# Patient Record
Sex: Female | Born: 1945 | Race: Black or African American | Hispanic: No | Marital: Single | Smoking: Former smoker
Health system: Southern US, Community
[De-identification: ages and names within clinical notes are randomized; demographics above are authoritative.]

## PROBLEM LIST (undated history)

## (undated) DIAGNOSIS — I5032 Chronic diastolic (congestive) heart failure: Secondary | ICD-10-CM

## (undated) DIAGNOSIS — M8580 Other specified disorders of bone density and structure, unspecified site: Secondary | ICD-10-CM

## (undated) DIAGNOSIS — I10 Essential (primary) hypertension: Secondary | ICD-10-CM

## (undated) DIAGNOSIS — E785 Hyperlipidemia, unspecified: Secondary | ICD-10-CM

## (undated) DIAGNOSIS — E039 Hypothyroidism, unspecified: Secondary | ICD-10-CM

## (undated) DIAGNOSIS — E559 Vitamin D deficiency, unspecified: Secondary | ICD-10-CM

## (undated) DIAGNOSIS — Z8619 Personal history of other infectious and parasitic diseases: Secondary | ICD-10-CM

## (undated) DIAGNOSIS — J181 Lobar pneumonia, unspecified organism: Secondary | ICD-10-CM

## (undated) DIAGNOSIS — E049 Nontoxic goiter, unspecified: Secondary | ICD-10-CM

## (undated) DIAGNOSIS — Z860100 Personal history of colon polyps, unspecified: Secondary | ICD-10-CM

## (undated) DIAGNOSIS — M069 Rheumatoid arthritis, unspecified: Secondary | ICD-10-CM

## (undated) DIAGNOSIS — D649 Anemia, unspecified: Secondary | ICD-10-CM

## (undated) DIAGNOSIS — I4891 Unspecified atrial fibrillation: Secondary | ICD-10-CM

## (undated) DIAGNOSIS — H269 Unspecified cataract: Secondary | ICD-10-CM

## (undated) DIAGNOSIS — R0602 Shortness of breath: Secondary | ICD-10-CM

## (undated) DIAGNOSIS — I071 Rheumatic tricuspid insufficiency: Secondary | ICD-10-CM

## (undated) DIAGNOSIS — Z862 Personal history of diseases of the blood and blood-forming organs and certain disorders involving the immune mechanism: Secondary | ICD-10-CM

## (undated) DIAGNOSIS — R718 Other abnormality of red blood cells: Secondary | ICD-10-CM

## (undated) DIAGNOSIS — R011 Cardiac murmur, unspecified: Secondary | ICD-10-CM

## (undated) DIAGNOSIS — I340 Nonrheumatic mitral (valve) insufficiency: Secondary | ICD-10-CM

## (undated) DIAGNOSIS — K222 Esophageal obstruction: Secondary | ICD-10-CM

## (undated) DIAGNOSIS — M810 Age-related osteoporosis without current pathological fracture: Secondary | ICD-10-CM

## (undated) DIAGNOSIS — I272 Pulmonary hypertension, unspecified: Secondary | ICD-10-CM

## (undated) HISTORY — DX: Hyperlipidemia, unspecified: E78.5

## (undated) HISTORY — DX: Personal history of colon polyps, unspecified: Z86.0100

## (undated) HISTORY — DX: Anemia, unspecified: D64.9

## (undated) HISTORY — DX: Esophageal obstruction: K22.2

## (undated) HISTORY — DX: Personal history of diseases of the blood and blood-forming organs and certain disorders involving the immune mechanism: Z86.2

## (undated) HISTORY — DX: Personal history of other infectious and parasitic diseases: Z86.19

## (undated) HISTORY — DX: Hypothyroidism, unspecified: E03.9

## (undated) HISTORY — DX: Other abnormality of red blood cells: R71.8

## (undated) HISTORY — DX: Age-related osteoporosis without current pathological fracture: M81.0

## (undated) HISTORY — DX: Vitamin D deficiency, unspecified: E55.9

## (undated) HISTORY — DX: Unspecified cataract: H26.9

## (undated) HISTORY — DX: Lobar pneumonia, unspecified organism: J18.1

## (undated) HISTORY — DX: Unspecified atrial fibrillation: I48.91

## (undated) HISTORY — DX: Chronic diastolic (congestive) heart failure: I50.32

## (undated) HISTORY — DX: Nontoxic goiter, unspecified: E04.9

## (undated) HISTORY — DX: Other specified disorders of bone density and structure, unspecified site: M85.80

## (undated) HISTORY — DX: Personal history of colonic polyps: Z86.010

## (undated) HISTORY — DX: Cardiac murmur, unspecified: R01.1

## (undated) HISTORY — DX: Rheumatoid arthritis, unspecified: M06.9

## (undated) HISTORY — DX: Essential (primary) hypertension: I10

---

## 1968-06-22 HISTORY — PX: PARTIAL HYSTERECTOMY: SHX80

## 1998-10-18 ENCOUNTER — Ambulatory Visit (HOSPITAL_COMMUNITY): Admission: RE | Admit: 1998-10-18 | Discharge: 1998-10-18 | Payer: Self-pay | Admitting: Endocrinology

## 1999-05-23 DIAGNOSIS — Z8709 Personal history of other diseases of the respiratory system: Secondary | ICD-10-CM | POA: Insufficient documentation

## 1999-06-03 ENCOUNTER — Other Ambulatory Visit: Admission: RE | Admit: 1999-06-03 | Discharge: 1999-06-03 | Payer: Self-pay | Admitting: Endocrinology

## 1999-06-07 ENCOUNTER — Ambulatory Visit (HOSPITAL_COMMUNITY): Admission: RE | Admit: 1999-06-07 | Discharge: 1999-06-07 | Payer: Self-pay | Admitting: Endocrinology

## 1999-06-07 ENCOUNTER — Encounter: Payer: Self-pay | Admitting: Endocrinology

## 1999-10-17 ENCOUNTER — Encounter: Payer: Self-pay | Admitting: Endocrinology

## 1999-10-17 ENCOUNTER — Ambulatory Visit (HOSPITAL_COMMUNITY): Admission: RE | Admit: 1999-10-17 | Discharge: 1999-10-17 | Payer: Self-pay | Admitting: Endocrinology

## 2000-10-19 ENCOUNTER — Emergency Department (HOSPITAL_COMMUNITY): Admission: EM | Admit: 2000-10-19 | Discharge: 2000-10-19 | Payer: Self-pay | Admitting: *Deleted

## 2000-10-22 ENCOUNTER — Ambulatory Visit (HOSPITAL_COMMUNITY): Admission: RE | Admit: 2000-10-22 | Discharge: 2000-10-22 | Payer: Self-pay | Admitting: Endocrinology

## 2000-10-22 ENCOUNTER — Encounter: Payer: Self-pay | Admitting: Endocrinology

## 2001-11-14 ENCOUNTER — Ambulatory Visit (HOSPITAL_COMMUNITY): Admission: RE | Admit: 2001-11-14 | Discharge: 2001-11-14 | Payer: Self-pay | Admitting: Endocrinology

## 2001-11-14 ENCOUNTER — Encounter: Payer: Self-pay | Admitting: Endocrinology

## 2002-10-27 ENCOUNTER — Ambulatory Visit (HOSPITAL_COMMUNITY): Admission: RE | Admit: 2002-10-27 | Discharge: 2002-10-27 | Payer: Self-pay | Admitting: Endocrinology

## 2002-10-27 ENCOUNTER — Encounter: Payer: Self-pay | Admitting: Endocrinology

## 2003-11-20 ENCOUNTER — Ambulatory Visit (HOSPITAL_COMMUNITY): Admission: RE | Admit: 2003-11-20 | Discharge: 2003-11-20 | Payer: Self-pay | Admitting: Endocrinology

## 2004-11-21 ENCOUNTER — Ambulatory Visit (HOSPITAL_COMMUNITY): Admission: RE | Admit: 2004-11-21 | Discharge: 2004-11-21 | Payer: Self-pay | Admitting: Endocrinology

## 2005-02-18 ENCOUNTER — Ambulatory Visit: Payer: Self-pay | Admitting: Internal Medicine

## 2005-02-19 ENCOUNTER — Ambulatory Visit: Payer: Self-pay | Admitting: Internal Medicine

## 2005-02-19 ENCOUNTER — Encounter (INDEPENDENT_AMBULATORY_CARE_PROVIDER_SITE_OTHER): Payer: Self-pay | Admitting: *Deleted

## 2005-02-19 DIAGNOSIS — Z860100 Personal history of colon polyps, unspecified: Secondary | ICD-10-CM | POA: Insufficient documentation

## 2005-02-19 DIAGNOSIS — K222 Esophageal obstruction: Secondary | ICD-10-CM

## 2005-02-19 DIAGNOSIS — Z862 Personal history of diseases of the blood and blood-forming organs and certain disorders involving the immune mechanism: Secondary | ICD-10-CM | POA: Insufficient documentation

## 2005-02-19 HISTORY — DX: Esophageal obstruction: K22.2

## 2005-07-06 ENCOUNTER — Encounter (INDEPENDENT_AMBULATORY_CARE_PROVIDER_SITE_OTHER): Payer: Self-pay | Admitting: Internal Medicine

## 2006-10-14 ENCOUNTER — Encounter (INDEPENDENT_AMBULATORY_CARE_PROVIDER_SITE_OTHER): Payer: Self-pay | Admitting: Internal Medicine

## 2006-10-21 ENCOUNTER — Ambulatory Visit: Payer: Self-pay | Admitting: Internal Medicine

## 2006-10-21 LAB — CONVERTED CEMR LAB
Cholesterol: 177 mg/dL
Triglycerides: 46 mg/dL

## 2006-10-22 ENCOUNTER — Ambulatory Visit: Payer: Self-pay | Admitting: *Deleted

## 2006-11-30 ENCOUNTER — Ambulatory Visit: Payer: Self-pay | Admitting: Internal Medicine

## 2006-11-30 LAB — CONVERTED CEMR LAB: Glucose, Bld: 90 mg/dL

## 2007-02-01 ENCOUNTER — Ambulatory Visit: Payer: Self-pay | Admitting: Internal Medicine

## 2007-02-01 LAB — CONVERTED CEMR LAB
BUN: 14 mg/dL (ref 6–23)
Calcium: 9.1 mg/dL (ref 8.4–10.5)
Chloride: 104 meq/L (ref 96–112)
Creatinine, Ser: 0.78 mg/dL (ref 0.40–1.20)

## 2007-02-16 ENCOUNTER — Encounter (INDEPENDENT_AMBULATORY_CARE_PROVIDER_SITE_OTHER): Payer: Self-pay | Admitting: Internal Medicine

## 2007-02-16 DIAGNOSIS — R7309 Other abnormal glucose: Secondary | ICD-10-CM

## 2007-02-16 DIAGNOSIS — E039 Hypothyroidism, unspecified: Secondary | ICD-10-CM | POA: Insufficient documentation

## 2007-02-16 DIAGNOSIS — E042 Nontoxic multinodular goiter: Secondary | ICD-10-CM

## 2007-02-22 ENCOUNTER — Telehealth (INDEPENDENT_AMBULATORY_CARE_PROVIDER_SITE_OTHER): Payer: Self-pay | Admitting: Internal Medicine

## 2007-02-26 DIAGNOSIS — E785 Hyperlipidemia, unspecified: Secondary | ICD-10-CM

## 2007-03-09 ENCOUNTER — Encounter (INDEPENDENT_AMBULATORY_CARE_PROVIDER_SITE_OTHER): Payer: Self-pay | Admitting: Nurse Practitioner

## 2007-03-09 ENCOUNTER — Ambulatory Visit: Payer: Self-pay | Admitting: Family Medicine

## 2007-03-09 LAB — CONVERTED CEMR LAB
Potassium: 3.3 meq/L — ABNORMAL LOW (ref 3.5–5.3)
TSH: 1.095 microintl units/mL (ref 0.350–5.50)

## 2007-03-14 ENCOUNTER — Encounter (INDEPENDENT_AMBULATORY_CARE_PROVIDER_SITE_OTHER): Payer: Self-pay | Admitting: Nurse Practitioner

## 2007-03-28 ENCOUNTER — Telehealth (INDEPENDENT_AMBULATORY_CARE_PROVIDER_SITE_OTHER): Payer: Self-pay | Admitting: Nurse Practitioner

## 2007-07-06 ENCOUNTER — Ambulatory Visit: Payer: Self-pay | Admitting: Nurse Practitioner

## 2007-07-06 DIAGNOSIS — M65839 Other synovitis and tenosynovitis, unspecified forearm: Secondary | ICD-10-CM

## 2007-07-06 DIAGNOSIS — M65849 Other synovitis and tenosynovitis, unspecified hand: Secondary | ICD-10-CM

## 2007-07-06 DIAGNOSIS — H6120 Impacted cerumen, unspecified ear: Secondary | ICD-10-CM

## 2007-09-27 ENCOUNTER — Ambulatory Visit (HOSPITAL_COMMUNITY): Admission: RE | Admit: 2007-09-27 | Discharge: 2007-09-27 | Payer: Self-pay | Admitting: Internal Medicine

## 2007-09-27 ENCOUNTER — Ambulatory Visit: Payer: Self-pay | Admitting: Internal Medicine

## 2007-09-27 DIAGNOSIS — R5383 Other fatigue: Secondary | ICD-10-CM

## 2007-09-27 DIAGNOSIS — R5381 Other malaise: Secondary | ICD-10-CM

## 2007-09-29 LAB — CONVERTED CEMR LAB
Basophils Absolute: 0 10*3/uL (ref 0.0–0.1)
Basophils Relative: 1 % (ref 0–1)
Eosinophils Absolute: 0.1 10*3/uL (ref 0.0–0.7)
Eosinophils Relative: 2 % (ref 0–5)
HCT: 35.3 % — ABNORMAL LOW (ref 36.0–46.0)
Hemoglobin: 11.8 g/dL — ABNORMAL LOW (ref 12.0–15.0)
Lymphocytes Relative: 31 % (ref 12–46)
Lymphs Abs: 1.6 10*3/uL (ref 0.7–4.0)
MCHC: 33.4 g/dL (ref 30.0–36.0)
MCV: 75.6 fL — ABNORMAL LOW (ref 78.0–100.0)
Monocytes Absolute: 0.4 10*3/uL (ref 0.1–1.0)
Monocytes Relative: 8 % (ref 3–12)
Neutro Abs: 3 10*3/uL (ref 1.7–7.7)
Neutrophils Relative %: 58 % (ref 43–77)
Platelets: 209 10*3/uL (ref 150–400)
RBC: 4.67 M/uL (ref 3.87–5.11)
RDW: 14.5 % (ref 11.5–15.5)
TSH: 6.385 microintl units/mL — ABNORMAL HIGH (ref 0.350–5.50)
WBC: 5.1 10*3/uL (ref 4.0–10.5)

## 2007-10-03 ENCOUNTER — Encounter (INDEPENDENT_AMBULATORY_CARE_PROVIDER_SITE_OTHER): Payer: Self-pay | Admitting: Nurse Practitioner

## 2007-10-03 LAB — CONVERTED CEMR LAB
Ferritin: 132 ng/mL (ref 10–291)
Iron: 114 ug/dL (ref 42–145)

## 2007-10-04 ENCOUNTER — Encounter (INDEPENDENT_AMBULATORY_CARE_PROVIDER_SITE_OTHER): Payer: Self-pay | Admitting: Nurse Practitioner

## 2007-10-04 ENCOUNTER — Ambulatory Visit: Payer: Self-pay | Admitting: Internal Medicine

## 2007-12-27 ENCOUNTER — Ambulatory Visit: Payer: Self-pay | Admitting: Internal Medicine

## 2007-12-27 LAB — CONVERTED CEMR LAB
Ketones, urine, test strip: NEGATIVE
Protein, U semiquant: NEGATIVE
WBC Urine, dipstick: NEGATIVE

## 2007-12-29 ENCOUNTER — Ambulatory Visit (HOSPITAL_COMMUNITY): Admission: RE | Admit: 2007-12-29 | Discharge: 2007-12-29 | Payer: Self-pay | Admitting: Internal Medicine

## 2008-01-13 LAB — CONVERTED CEMR LAB
ALT: 14 units/L (ref 0–35)
AST: 17 units/L (ref 0–37)
BUN: 12 mg/dL (ref 6–23)
Basophils Relative: 1 % (ref 0–1)
Calcium: 9.4 mg/dL (ref 8.4–10.5)
Cholesterol: 220 mg/dL — ABNORMAL HIGH (ref 0–200)
Creatinine, Ser: 0.84 mg/dL (ref 0.40–1.20)
Eosinophils Absolute: 0.1 10*3/uL (ref 0.0–0.7)
HDL: 61 mg/dL (ref >39)
Hemoglobin: 11.9 g/dL — ABNORMAL LOW (ref 12.0–15.0)
LDL Cholesterol: 141 mg/dL — ABNORMAL HIGH (ref 0–99)
MCHC: 31.9 g/dL (ref 30.0–36.0)
Monocytes Absolute: 0.4 10*3/uL (ref 0.1–1.0)
Neutro Abs: 3.6 10*3/uL (ref 1.7–7.7)
Platelets: 196 10*3/uL (ref 150–400)
RDW: 15.2 % (ref 11.5–15.5)
Sodium: 140 meq/L (ref 135–145)
Total Protein: 8 g/dL (ref 6.0–8.3)
Triglycerides: 88 mg/dL (ref <150)
VLDL: 18 mg/dL (ref 0–40)
WBC: 5.5 10*3/uL (ref 4.0–10.5)

## 2008-01-24 ENCOUNTER — Encounter (INDEPENDENT_AMBULATORY_CARE_PROVIDER_SITE_OTHER): Payer: Self-pay | Admitting: Internal Medicine

## 2008-01-24 ENCOUNTER — Ambulatory Visit (HOSPITAL_BASED_OUTPATIENT_CLINIC_OR_DEPARTMENT_OTHER): Admission: RE | Admit: 2008-01-24 | Discharge: 2008-01-24 | Payer: Self-pay | Admitting: Internal Medicine

## 2008-01-28 ENCOUNTER — Ambulatory Visit: Payer: Self-pay | Admitting: Internal Medicine

## 2008-02-01 ENCOUNTER — Ambulatory Visit: Payer: Self-pay | Admitting: Internal Medicine

## 2008-02-08 ENCOUNTER — Encounter (INDEPENDENT_AMBULATORY_CARE_PROVIDER_SITE_OTHER): Payer: Self-pay | Admitting: Internal Medicine

## 2008-02-08 DIAGNOSIS — R0989 Other specified symptoms and signs involving the circulatory and respiratory systems: Secondary | ICD-10-CM

## 2008-02-08 DIAGNOSIS — R0609 Other forms of dyspnea: Secondary | ICD-10-CM

## 2008-02-28 ENCOUNTER — Ambulatory Visit: Payer: Self-pay | Admitting: Internal Medicine

## 2008-03-27 ENCOUNTER — Ambulatory Visit: Payer: Self-pay | Admitting: Internal Medicine

## 2008-04-10 ENCOUNTER — Ambulatory Visit: Payer: Self-pay | Admitting: Internal Medicine

## 2008-04-10 LAB — CONVERTED CEMR LAB: TSH: 1.273 microintl units/mL (ref 0.350–4.50)

## 2008-05-07 ENCOUNTER — Encounter (INDEPENDENT_AMBULATORY_CARE_PROVIDER_SITE_OTHER): Payer: Self-pay | Admitting: Internal Medicine

## 2008-06-07 ENCOUNTER — Telehealth (INDEPENDENT_AMBULATORY_CARE_PROVIDER_SITE_OTHER): Payer: Self-pay | Admitting: Internal Medicine

## 2008-07-16 ENCOUNTER — Encounter (INDEPENDENT_AMBULATORY_CARE_PROVIDER_SITE_OTHER): Payer: Self-pay | Admitting: Internal Medicine

## 2009-02-26 ENCOUNTER — Ambulatory Visit: Payer: Self-pay | Admitting: Internal Medicine

## 2009-02-26 DIAGNOSIS — M7660 Achilles tendinitis, unspecified leg: Secondary | ICD-10-CM

## 2009-03-13 ENCOUNTER — Ambulatory Visit: Payer: Self-pay | Admitting: Internal Medicine

## 2009-03-13 LAB — CONVERTED CEMR LAB
Glucose, Urine, Semiquant: NEGATIVE
Ketones, urine, test strip: NEGATIVE
Protein, U semiquant: NEGATIVE
Specific Gravity, Urine: 1.01
pH: 7

## 2009-03-14 ENCOUNTER — Encounter (INDEPENDENT_AMBULATORY_CARE_PROVIDER_SITE_OTHER): Payer: Self-pay | Admitting: Internal Medicine

## 2009-03-18 LAB — CONVERTED CEMR LAB
ALT: 15 units/L (ref 0–35)
Alkaline Phosphatase: 82 units/L (ref 39–117)
BUN: 12 mg/dL (ref 6–23)
Basophils Absolute: 0 10*3/uL (ref 0.0–0.1)
Calcium: 9.1 mg/dL (ref 8.4–10.5)
Chloride: 103 meq/L (ref 96–112)
Creatinine, Ser: 0.79 mg/dL (ref 0.40–1.20)
Lymphocytes Relative: 28 % (ref 12–46)
Lymphs Abs: 1.3 10*3/uL (ref 0.7–4.0)
MCV: 75.1 fL — ABNORMAL LOW (ref 78.0–100.0)
Potassium: 3.8 meq/L (ref 3.5–5.3)
RBC: 4.46 M/uL (ref 3.87–5.11)
RDW: 15.1 % (ref 11.5–15.5)
TSH: 6.48 microintl units/mL — ABNORMAL HIGH (ref 0.350–4.500)
Triglycerides: 57 mg/dL (ref <150)

## 2009-07-05 ENCOUNTER — Ambulatory Visit: Payer: Self-pay | Admitting: Internal Medicine

## 2009-07-12 ENCOUNTER — Encounter (INDEPENDENT_AMBULATORY_CARE_PROVIDER_SITE_OTHER): Payer: Self-pay | Admitting: Internal Medicine

## 2009-07-12 LAB — CONVERTED CEMR LAB
Basophils Absolute: 0 10*3/uL (ref 0.0–0.1)
Eosinophils Absolute: 0.1 10*3/uL (ref 0.0–0.7)
Eosinophils Relative: 2 % (ref 0–5)
HCT: 38.5 % (ref 36.0–46.0)
Hemoglobin: 12.5 g/dL (ref 12.0–15.0)
Lymphs Abs: 1.9 10*3/uL (ref 0.7–4.0)
MCHC: 32.5 g/dL (ref 30.0–36.0)
MCV: 74.8 fL — ABNORMAL LOW (ref 78.0–100.0)
Monocytes Absolute: 0.5 10*3/uL (ref 0.1–1.0)
Monocytes Relative: 8 % (ref 3–12)
Neutro Abs: 3.9 10*3/uL (ref 1.7–7.7)
TSH: 5.551 microintl units/mL — ABNORMAL HIGH (ref 0.350–4.500)

## 2009-08-06 ENCOUNTER — Ambulatory Visit: Payer: Self-pay | Admitting: Internal Medicine

## 2009-12-03 ENCOUNTER — Telehealth (INDEPENDENT_AMBULATORY_CARE_PROVIDER_SITE_OTHER): Payer: Self-pay | Admitting: Internal Medicine

## 2009-12-03 ENCOUNTER — Ambulatory Visit: Payer: Self-pay | Admitting: Internal Medicine

## 2009-12-03 DIAGNOSIS — L538 Other specified erythematous conditions: Secondary | ICD-10-CM

## 2009-12-03 DIAGNOSIS — Z780 Asymptomatic menopausal state: Secondary | ICD-10-CM | POA: Insufficient documentation

## 2009-12-03 DIAGNOSIS — R82998 Other abnormal findings in urine: Secondary | ICD-10-CM | POA: Insufficient documentation

## 2009-12-03 LAB — CONVERTED CEMR LAB
Alkaline Phosphatase: 95 units/L (ref 39–117)
Cholesterol: 184 mg/dL (ref 0–200)
HDL: 59 mg/dL (ref >39)
LDL Cholesterol: 109 mg/dL — ABNORMAL HIGH (ref 0–99)
Protein, U semiquant: NEGATIVE
Sodium: 143 meq/L (ref 135–145)
Total CHOL/HDL Ratio: 3.1
Triglycerides: 79 mg/dL (ref <150)
VLDL: 16 mg/dL (ref 0–40)

## 2009-12-11 ENCOUNTER — Encounter (INDEPENDENT_AMBULATORY_CARE_PROVIDER_SITE_OTHER): Payer: Self-pay | Admitting: Internal Medicine

## 2009-12-11 ENCOUNTER — Ambulatory Visit (HOSPITAL_COMMUNITY): Admission: RE | Admit: 2009-12-11 | Discharge: 2009-12-11 | Payer: Self-pay | Admitting: Internal Medicine

## 2009-12-25 ENCOUNTER — Ambulatory Visit: Payer: Self-pay | Admitting: Internal Medicine

## 2009-12-30 ENCOUNTER — Encounter (INDEPENDENT_AMBULATORY_CARE_PROVIDER_SITE_OTHER): Payer: Self-pay | Admitting: Internal Medicine

## 2009-12-30 ENCOUNTER — Encounter (INDEPENDENT_AMBULATORY_CARE_PROVIDER_SITE_OTHER): Payer: Self-pay | Admitting: *Deleted

## 2010-01-01 ENCOUNTER — Encounter (INDEPENDENT_AMBULATORY_CARE_PROVIDER_SITE_OTHER): Payer: Self-pay | Admitting: Internal Medicine

## 2010-01-01 ENCOUNTER — Telehealth (INDEPENDENT_AMBULATORY_CARE_PROVIDER_SITE_OTHER): Payer: Self-pay | Admitting: Internal Medicine

## 2010-01-01 DIAGNOSIS — M8580 Other specified disorders of bone density and structure, unspecified site: Secondary | ICD-10-CM | POA: Insufficient documentation

## 2010-01-08 ENCOUNTER — Ambulatory Visit: Payer: Self-pay | Admitting: Internal Medicine

## 2010-01-08 LAB — CONVERTED CEMR LAB: Vit D, 25-Hydroxy: 14 ng/mL — ABNORMAL LOW (ref 30–89)

## 2010-01-16 ENCOUNTER — Encounter (INDEPENDENT_AMBULATORY_CARE_PROVIDER_SITE_OTHER): Payer: Self-pay | Admitting: Internal Medicine

## 2010-01-24 ENCOUNTER — Encounter (INDEPENDENT_AMBULATORY_CARE_PROVIDER_SITE_OTHER): Payer: Self-pay | Admitting: *Deleted

## 2010-01-29 ENCOUNTER — Ambulatory Visit: Payer: Self-pay | Admitting: Internal Medicine

## 2010-02-12 ENCOUNTER — Ambulatory Visit: Payer: Self-pay | Admitting: Internal Medicine

## 2010-02-16 ENCOUNTER — Encounter: Payer: Self-pay | Admitting: Internal Medicine

## 2010-05-14 ENCOUNTER — Ambulatory Visit: Payer: Self-pay | Admitting: Internal Medicine

## 2010-07-03 ENCOUNTER — Ambulatory Visit: Admit: 2010-07-03 | Payer: Self-pay | Admitting: Internal Medicine

## 2010-07-13 ENCOUNTER — Encounter: Payer: Self-pay | Admitting: Internal Medicine

## 2010-07-22 NOTE — Progress Notes (Signed)
Summary: Vit D  Phone Note Outgoing Call   Summary of Call: Please call pt--let her know she has some bone density loss, not to level of osteoporosis, however.  Would like to have her come in for a Vitamin D level.  Then needs to make sure she is taking 500 mg of calcium two times a day with total of 800 micrograms of Vitamin D daily Initial call taken by: Julieanne Manson MD,  January 01, 2010 3:16 PM  Follow-up for Phone Call        Left message on answering machine for pt to return call at [phone] Follow-up by: Vesta Mixer CMA,  January 01, 2010 3:21 PM  Additional Follow-up for Phone Call Additional follow up Details #1::        pt aware of lab results ... pt says she takes k+ and not calcium Additional Follow-up by: Armenia Shannon RMA,  January 01, 2010 3:54 PM    Additional Follow-up for Phone Call Additional follow up Details #2::    Did she get set up for the Vitamin D level as ordered? Was she given the message about how much calcium and Vitamin D to take?--confused by the response written Follow-up by: Julieanne Manson MD,  January 02, 2010 6:20 PM  Additional Follow-up for Phone Call Additional follow up Details #3:: Details for Additional Follow-up Action Taken: Left message on answer machine for pt. to return call. Pt. needs lab appt. for Vit D level, please advise her to take 500mg  Calcium two times a day and Vit D 800 micrograms daily. Gaylyn Cheers RN  January 07, 2010 12:16 PM  lab appt made.... Armenia Shannon  January 07, 2010 12:48 PM

## 2010-07-22 NOTE — Miscellaneous (Signed)
Summary: [facility] Previsit/prep  Clinical Lists Changes  Observations: Added new observation of ALLERGY REV: Done (01/29/2010 10:35)

## 2010-07-22 NOTE — Letter (Signed)
Summary: REFERRAL FORM//PHYSICAL THERAPY  REFERRAL FORM//PHYSICAL THERAPY   Imported By: Arta Bruce 08/22/2009 14:13:52  _____________________________________________________________________  External Attachment:    Type:   Image     Comment:   External Document

## 2010-07-22 NOTE — Letter (Signed)
Summary: Lipid Letter  [facility]  [address] [place], [place] [address]   Phone: [phone]  Fax: [phone]    12/11/2009  Amantha Sklar [address] [place], [place]  [address]  Dear Ms. Gilland:  We have carefully reviewed your last lipid profile from 12/03/2009 and the results are noted below with a summary of recommendations for lipid management.    Cholesterol:       184     Goal: <200   HDL "good" Cholesterol:   59     Goal: >45   LDL "bad" Cholesterol:   109     Goal: <100   Triglycerides:       79     Goal: <150    Cholesterol is fine as were the rest of your labs, including thyroid    TLC Diet (Therapeutic Lifestyle Change): Saturated Fats & Transfatty acids should be kept < 7% of total calories ***Reduce Saturated Fats Polyunstaurated Fat can be up to 10% of total calories Monounsaturated Fat Fat can be up to 20% of total calories Total Fat should be no greater than 25-35% of total calories Carbohydrates should be 50-60% of total calories Protein should be approximately 15% of total calories Fiber should be at least 20-30 grams a day ***Increased fiber may help lower LDL Total Cholesterol should be < 200mg /day Consider adding plant stanol/sterols to diet (example: Benacol spread) ***A higher intake of unsaturated fat may reduce Triglycerides and Increase HDL    Adjunctive Measures (may lower LIPIDS and reduce risk of Heart Attack) include: Aerobic Exercise (20-30 minutes 3-4 times a week) Limit Alcohol Consumption Weight Reduction Aspirin 75-81 mg a day by mouth (if not allergic or contraindicated) Dietary Fiber 20-30 grams a day by mouth     Current Medications: 1)    Klor-con 10 10 Meq  Tbcr (Potassium chloride) .... 2 tablets daily for potassium 2)    Hyzaar 100-25 Mg  Tabs (Losartan potassium-hctz) .... Take 1 tablet by mouth once a day 3)    Norvasc 10 Mg  Tabs (Amlodipine besylate) .Marland Kitchen.. 1 tab by mouth daily 4)    Benazepril Hcl 40 Mg   Tabs (Benazepril hcl) .Marland Kitchen.. 1 tab by mouth daily 5)    Toprol Xl 100 Mg Xr24h-tab (Metoprolol succinate) .Marland Kitchen.. 1 tab by mouth daily 6)    Levothyroxine Sodium 175 Mcg Tabs (Levothyroxine sodium) .Marland Kitchen.. 1 tab by mouth daily 7)    Ferrous Sulfate 325 (65 Fe) Mg Tabs (Ferrous sulfate) .Marland Kitchen.. 1 tab by mouth daily 8)    Gyne-lotrimin 3 2 % Crea (Clotrimazole) .... Apply two times a day to affected area for 2 weeks after rash clears and then as needed --feet and intriginous areas  If you have any questions, please call. We appreciate being able to work with you.   Sincerely,    [facility] Julieanne Manson MD

## 2010-07-22 NOTE — Letter (Signed)
Summary: Patient Notice- Polyp Results  [facility]  [address] [place], [place] [address]   Phone: [phone]  Fax: [phone]        February 16, 2010 MRN: [id number]    Joanna Hood [address] [place], [place]  [address]    Dear [name],  I am pleased to inform you that the colon polyp(s) removed during your recent colonoscopy was (were) found to be benign (no cancer detected) upon pathologic examination.  I recommend you have a repeat colonoscopy examination in 5 years to look for recurrent polyps, as having colon polyps increases your risk for having recurrent polyps or even colon cancer in the future.  Should you develop new or worsening symptoms of abdominal pain, bowel habit changes or bleeding from the rectum or bowels, please schedule an evaluation with either your primary care physician or with me.  Additional information/recommendations:  __ No further action with gastroenterology is needed at this time. Please      follow-up with your primary care physician for your other healthcare      needs.   Please call us if you are having persistent problems or have questions about your condition that have not been fully answered at this time.  Sincerely,  Hilarie Fredrickson MD  This letter has been electronically signed by your physician.  Appended Document: Patient Notice- Polyp Results letter mailed

## 2010-07-22 NOTE — Letter (Signed)
Summary: [facility] Instructions  [facility]  [address] [place], [place] [address]   Phone: [phone]  Fax: [phone]       Joanna Hood    [birth year]    MRN: [id number]        Procedure Day Dorna Bloom:  [facility]  02/12/10     Arrival Time:  10:00AM     Procedure Time:  11:00AM     Location of Procedure:                    _X _  [facility] (4th Floor)                       PREPARATION FOR COLONOSCOPY WITH MOVIPREP   Starting 5 days prior to your procedure 02/07/10 do not eat nuts, seeds, popcorn, corn, beans, peas,  salads, or any raw vegetables.  Do not take any fiber supplements (e.g. Metamucil, Citrucel, and Benefiber).  THE DAY BEFORE YOUR PROCEDURE         DATE: 02/11/10  DAY: TUESDAY  1.  Drink clear liquids the entire day-NO SOLID FOOD  2.  Do not drink anything colored red or purple.  Avoid juices with pulp.  No orange juice.  3.  Drink at least 64 oz. (8 glasses) of fluid/clear liquids during the day to prevent dehydration and help the prep work efficiently.  CLEAR LIQUIDS INCLUDE: Water Jello Ice Popsicles Tea (sugar ok, no milk/cream) Powdered fruit flavored drinks Coffee (sugar ok, no milk/cream) Gatorade Juice: apple, white grape, white cranberry  Lemonade Clear bullion, consomm, broth Carbonated beverages (any kind) Strained chicken noodle soup Hard Candy                             4.  In the morning, mix first dose of MoviPrep solution:    Empty 1 Pouch A and 1 Pouch B into the disposable container    Add lukewarm drinking water to the top line of the container. Mix to dissolve    Refrigerate (mixed solution should be used within 24 hrs)  5.  Begin drinking the prep at 5:00 p.m. The MoviPrep container is divided by 4 marks.   Every 15 minutes drink the solution down to the next mark (approximately 8 oz) until the full liter is complete.   6.  Follow completed prep with 16 oz of clear liquid of your choice  (Nothing red or purple).  Continue to drink clear liquids until bedtime.  7.  Before going to bed, mix second dose of MoviPrep solution:    Empty 1 Pouch A and 1 Pouch B into the disposable container    Add lukewarm drinking water to the top line of the container. Mix to dissolve    Refrigerate  THE DAY OF YOUR PROCEDURE      DATE: 02/12/10  DAY: WEDNESDAY  Beginning at 6:00AM (5 hours before procedure):         1. Every 15 minutes, drink the solution down to the next mark (approx 8 oz) until the full liter is complete.  2. Follow completed prep with 16 oz. of clear liquid of your choice.    3. You may drink clear liquids until 9:00AM (2 HOURS BEFORE PROCEDURE).   MEDICATION INSTRUCTIONS  Unless otherwise instructed, you should take regular prescription medications with a small sip of water   as early as possible the morning of  your procedure.  Be sure to take your other blood pressure medicine not listed below the morning of your procedure.   Additional medication instructions: Hold Klor-Con and Hyzaar the morning of procedure.   Stop iron 02/07/10.         OTHER INSTRUCTIONS  You will need a responsible adult at least 64 years of age to accompany you and drive you home.   This person must remain in the waiting room during your procedure.  Wear loose fitting clothing that is easily removed.  Leave jewelry and other valuables at home.  However, you may wish to bring a book to read or  an iPod/MP3 player to listen to music as you wait for your procedure to start.  Remove all body piercing jewelry and leave at home.  Total time from sign-in until discharge is approximately 2-3 hours.  You should go home directly after your procedure and rest.  You can resume normal activities the  day after your procedure.  The day of your procedure you should not:   Drive   Make legal decisions   Operate machinery   Drink alcohol   Return to work  You will receive specific  instructions about eating, activities and medications before you leave.    The above instructions have been reviewed and explained to me by   Wyona Almas RN  January 29, 2010 11:40 AM     I fully understand and can verbalize these instructions _____________________________ Date _________

## 2010-07-22 NOTE — Assessment & Plan Note (Signed)
Summary: 6 MONTH FU FOR CPP/////KT   Vital Signs:  Patient profile:   65 year old female Menstrual status:  hysterectomy Weight:      173 pounds BMI:     31.25 Temp:     97.5 degrees F Pulse rate:   66 / minute Pulse rhythm:   regular Resp:     18 per minute BP sitting:   154 / 79  (left arm) Cuff size:   regular  Vitals Entered By: Vesta Mixer CMA (December 03, 2009 9:15 AM)  History of Present Illness: 65 yo female here for CPP.  Concerns  1.  Hypertension:  has had borderline checks in last 1-2 years.  Has taken meds today.  2.  Irritation and pruritis in groin areas bilaterally.  No wetness or redness.  Fairly consistent for past 3-6 months.  Has tried changing soaps, hydrocortisone cream (this helps for a little while), vaseline.  Habits & Providers  Alcohol-Tobacco-Diet     Alcohol drinks/day: 0     Tobacco Status: quit     Year Started: smoked only 2 years.  Ages 44-20  Exercise-Depression-Behavior     Drug Use: never  Allergies (verified): 1)  Flagyl (Metronidazole)  Past History:  Past Medical History: Reviewed history from 02/16/2007 and no changes required. 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 1.  1981  Total Abdominal Hysterectomy  2.  02/19/05  EGD  Esophageal Stricture/Hiatal hernia--Dr. Marina Goodell 3.  02/19/05  Colonoscopy  Adenomatous polyp--Dr. Marina Goodell  Family History: Father--unknown Mother, died age 58, cervical cancer Brother, 34, healthy Sister, 34, DM Daughter, 86:  Hypertension  Social History: Works at a homeless shelter--hospitality house--part  time.  Cooks mainly 27 yo granddaughter lives with pt.Smoking Status:  quit Drug Use:  never  Review of Systems General:  Energy is fine. Eyes:  bifocals--Dr. Rodena Piety.  Vision fairly stable. ENT:  Denies decreased hearing. CV:  Denies chest pain or discomfort and palpitations. Resp:  Denies cough and shortness of breath. GI:  Denies abdominal pain, bloody stools, constipation, dark tarry stools, and diarrhea. GU:  Denies discharge and dysuria. MS:  Denies joint pain, joint redness, and joint swelling. Derm:  Denies lesion(s); See HPI. Neuro:  Denies numbness, tingling, and weakness. Psych:  Denies anxiety, depression, and suicidal thoughts/plans; PQH-9 scored 0.  Physical Exam  General:  Well-developed,well-nourished,in no acute distress; alert,appropriate and cooperative throughout examination Head:  Normocephalic and atraumatic without obvious abnormalities. No apparent alopecia or balding. Eyes:  No corneal or conjunctival inflammation noted. EOMI. Perrla. Funduscopic exam benign, without hemorrhages, exudates or papilledema. Vision grossly normal. Ears:  External ear exam shows no significant lesions or deformities.  Otoscopic examination reveals clear canals, tympanic membranes are intact bilaterally without bulging, retraction, inflammation or discharge. Hearing is grossly normal bilaterally. Nose:  External nasal examination shows no deformity or inflammation. Nasal mucosa are pink and moist without lesions or exudates. Mouth:  Oral mucosa and oropharynx without lesions or exudates.  Teeth in good repair. Neck:  No deformities, masses, or tenderness noted.  MIldly enlarged nodular thyroid.  No discreet nodule palpated Breasts:  No mass, nodules, thickening, tenderness, bulging, retraction, inflamation, nipple discharge or skin changes noted.   Lungs:  Normal respiratory effort, chest expands symmetrically. Lungs are clear to  auscultation, no crackles or wheezes. Heart:  Normal rate  and regular rhythm. S1 and S2 normal without gallop, murmur, click, rub or other extra sounds. Abdomen:  Bowel sounds positive,abdomen soft and non-tender without masses, organomegaly or hernias noted. Rectal:  No external abnormalities noted. Normal sphincter tone. No rectal masses or tenderness.  Heme negative light brown stool Genitalia:  Pelvic Exam:        External: normal female genitalia with mild erythema with surrounding hyperpigmentation and some areas with splitting of skin in groin bilaterally--extends down to labia majora        Vagina: normal without lesions or masses         Adnexa: normal bimanual exam without masses or fullness        Uterus: absent        Pap smear: not performed Msk:  No deformity or scoliosis noted of thoracic or lumbar spine.   Pulses:  R and L carotid,radial,femoral,dorsalis pedis and posterior tibial pulses are full and equal bilaterally Extremities:  No clubbing, cyanosis, edema, or deformity noted with normal full range of motion of all joints.   Neurologic:  No cranial nerve deficits noted. Station and gait are normal. Plantar reflexes are down-going bilaterally. DTRs are symmetrical throughout. Sensory, motor and coordinative functions appear intact. Skin:  See genital exam Feet with generalized flaking and peeling of skin.  Toenails thickened and yellow-brown Cervical Nodes:  No lymphadenopathy noted Axillary Nodes:  No palpable lymphadenopathy Inguinal Nodes:  No significant adenopathy Psych:  Cognition and judgment appear intact. Alert and cooperative with normal attention span and concentration. No apparent delusions, illusions, hallucinations   Impression & Recommendations:  Problem # 1:  ROUTINE GYNECOLOGICAL EXAMINATION (ICD-V72.31) Mammogram scheduled Schedule DEXA  Problem # 2:  HYPERTENSION (ICD-401.9)  Increase Toprol to 100 mg daily--watch pulse Her updated medication list for this problem includes:    Hyzaar 100-25 Mg Tabs  (Losartan potassium-hctz) .Marland Kitchen... Take 1 tablet by mouth once a day    Norvasc 10 Mg Tabs (Amlodipine besylate) .Marland Kitchen... 1 tab by mouth daily    Benazepril Hcl 40 Mg Tabs (Benazepril hcl) .Marland Kitchen... 1 tab by mouth daily    Toprol Xl 100 Mg Xr24h-tab (Metoprolol succinate) .Marland Kitchen... 1 tab by mouth daily  Orders: T-Comprehensive Metabolic Panel ([id number]) UA Dipstick w/o Micro (manual) (91478)  Problem # 3:  INTERTRIGO (GNF-621.30) LOtrimin--for tinea pedis as well. Discussed care of feet and intertrigous area to prevent recurrence  Problem # 4:  COLONIC POLYPS, ADENOMATOUS, HX OF (ICD-V12.72)  Orders: Gastroenterology Referral (GI) repeat colonoscopy  Problem # 5:  HYPERLIPIDEMIA (ICD-272.4)  Orders: T-Lipid Profile ([id number])  Problem # 6:  HYPOTHYROIDISM (ICD-244.9)  Her updated medication list for this problem includes:    Levothyroxine Sodium 175 Mcg Tabs (Levothyroxine sodium) .Marland Kitchen... 1 tab by mouth daily  Orders: T-TSH ([id number])  Complete Medication List: 1)  Klor-con 10 10 Meq Tbcr (Potassium chloride) .... 2 tablets daily for potassium 2)  Hyzaar 100-25 Mg Tabs (Losartan potassium-hctz) .... Take 1 tablet by mouth once a day 3)  Norvasc 10 Mg Tabs (Amlodipine besylate) .Marland Kitchen.. 1 tab by mouth daily 4)  Benazepril Hcl 40 Mg Tabs (Benazepril hcl) .Marland Kitchen.. 1 tab by mouth daily 5)  Toprol Xl 100 Mg Xr24h-tab (Metoprolol succinate) .Marland Kitchen.. 1 tab by mouth daily 6)  Levothyroxine Sodium 175 Mcg Tabs (Levothyroxine sodium) .Marland Kitchen.. 1 tab by mouth daily 7)  Ferrous Sulfate 325 (65 Fe) Mg Tabs (Ferrous sulfate) .Marland Kitchen.. 1 tab by mouth daily 8)  Gyne-lotrimin 3 2 % Crea (Clotrimazole) .... Apply two times a day to affected area for 2 weeks after rash clears and then as needed --feet and intriginous areas  Other Orders: Dexa scan (Dexa scan) T-Culture, Urine ([id number])  Patient Instructions: 1)  NUrse visit for bp and pulse check in 2 weeks. 2)  Follow up with Dr. Delrae Alfred in 6 months- Htn 3)   Call for Complete exam in 1 year with Dr Delrae Alfred  Preventive Care Screening  Prior Values:    Pap Smear:  Done (07/06/2005)    Mammogram:  Normal (12/29/2007)    Colonoscopy:  Abnormal (02/19/2005)    Last Tetanus Booster:  Historical (06/23/2003)    Last Flu Shot:  Fluvax 3+ (07/05/2009)     SBE:  yes, no changes Osteoprevention:  Not much dairy daily.  No calcium or Vitamin D supplement.  No hx of bone density.  Walks 3-4 times weekly. Guaiac Cards:  has not returned in some time. Colonoscopy:  due for follow up with Dr. Marina Goodell this year.   Prescriptions: FERROUS SULFATE 325 (65 FE) MG TABS (FERROUS SULFATE) 1 tab by mouth daily  #30 x 4   Entered and Authorized by:   Julieanne Manson MD   Signed by:   Julieanne Manson MD on 12/03/2009   Method used:   Faxed to ...       [facility] (retail)       [address] [place], [place]  [address]       Ph: [phone]       Fax: [phone]   RxID:   [id number] LEVOTHYROXINE SODIUM 175 MCG TABS (LEVOTHYROXINE SODIUM) 1 tab by mouth daily  #30 x 11   Entered and Authorized by:   Julieanne Manson MD   Signed by:   Julieanne Manson MD on 12/03/2009   Method used:   Faxed to ...       [facility] (retail)       [address] [place], [place]  [address]       Ph: [phone]       Fax: [phone]   RxID:   [id number] BENAZEPRIL HCL 40 MG  TABS (BENAZEPRIL HCL) 1 tab by mouth daily  #30 x 11   Entered and Authorized by:   Julieanne Manson MD   Signed by:   Julieanne Manson MD on 12/03/2009   Method used:   Faxed to ...       [facility] (retail)       [address] [place], [place]  [address]       Ph: [phone]       Fax: [phone]   RxID:   [id number] NORVASC 10 MG  TABS (AMLODIPINE BESYLATE) 1 tab by mouth daily  #30 x 11   Entered and Authorized by:    Julieanne Manson MD   Signed by:   Julieanne Manson MD on 12/03/2009   Method used:   Faxed to ...       [facility] (retail)       [address] [place], [place]  [address]       Ph: [phone]       Fax: [phone]   RxID:   [id number] HYZAAR 100-25 MG  TABS Claris Gladden  POTASSIUM-HCTZ) Take 1 tablet by mouth once a day  #30 x 11   Entered and Authorized by:   Julieanne Manson MD   Signed by:   Julieanne Manson MD on 12/03/2009   Method used:   Faxed to ...       [facility] (retail)       [address] [place], [place]  [address]       Ph: [phone]       Fax: [phone]   RxID:   [id number] KLOR-CON 10 10 MEQ  TBCR (POTASSIUM CHLORIDE) 2 tablets daily for potassium  #60 x 11   Entered and Authorized by:   Julieanne Manson MD   Signed by:   Julieanne Manson MD on 12/03/2009   Method used:   Faxed to ...       [facility] (retail)       [address] [place], [place]  [address]       Ph: [phone]       Fax: [phone]   RxID:   [id number] GYNE-LOTRIMIN 3 2 % CREA (CLOTRIMAZOLE) apply two times a day to affected area for 2 weeks after rash clears and then as needed --feet and intriginous areas  #60 g x 2   Entered and Authorized by:   Julieanne Manson MD   Signed by:   Julieanne Manson MD on 12/03/2009   Method used:   Faxed to ...       [facility] (retail)       [address] [place], [place]  [address]       Ph: [phone]       Fax: [phone]   RxID:   [id number] TOPROL XL 100 MG XR24H-TAB (METOPROLOL SUCCINATE) 1 tab by mouth daily  #30 x 11   Entered and Authorized by:   Julieanne Manson MD   Signed by:   Julieanne Manson MD on 12/03/2009   Method used:   Faxed to ...       [facility] (retail)        [address] [place], [place]  [address]       Ph: [phone]       Fax: [phone]   RxID:   [id number]   Laboratory Results   Urine Tests    Routine Urinalysis   Glucose: negative   (Normal Range: Negative) Bilirubin: negative   (Normal Range: Negative) Ketone: negative   (Normal Range: Negative) Spec. Gravity: 1.010   (Normal Range: 1.003-1.035) Blood: trace-intact   (Normal Range: Negative) pH: 6.0   (Normal Range: 5.0-8.0) Protein: negative   (Normal Range: Negative) Urobilinogen: 0.2   (Normal Range: 0-1) Nitrite: negative   (Normal Range: Negative) Leukocyte Esterace: small   (Normal Range: Negative)

## 2010-07-22 NOTE — Letter (Signed)
Summary: *HSN Results Follow up  [facility]  [address] [place], [place] [address]   Phone: [phone]  Fax: [phone]      01/16/2010   Greer L Furrow [address] [place], [place]  [address]   Dear  Ms. Maxene Moffit,                            ____S.Drinkard,FNP   ____D. Gore,FNP       ____B. McPherson,MD   ____V. Rankins,MD    __X__E. Lyrah Bradt,MD    ____N. Daphine Deutscher, FNP  ____D. Reche Dixon, MD    ____K. Philipp Deputy, MD    ____Other     This letter is to inform you that your recent test(s):  _______Pap Smear    ____X___Lab Test     _______X-ray    ___X____ is within acceptable limits  _______ requires a medication change  _______ requires a follow-up lab visit  _______ requires a follow-up visit with your provider   Comments:  Your Vitamin D level was okay--so just need to take a total of 800 International Units  along with the Calcium that Tiffany discussed with you.  Make sure you are getting regular exercise as well.       _________________________________________________________ If you have any questions, please contact our office                     Sincerely,  Julieanne Manson MD [facility]

## 2010-07-22 NOTE — Letter (Signed)
Summary: Previsit letter  [facility]  [address] [place], [place] [address]   Phone: [phone]  Fax: [phone]       12/30/2009 MRN: [id number]  Joanna Hood [address] [place], [place]  [address]  Dear [name],  Welcome to the Gastroenterology Division at [facility].    You are scheduled to see a nurse for your pre-procedure visit on 01-29-10 at 11:00a.m. on the 3rd floor at [facility], [address]  We ask that you try to arrive at our office 15 minutes prior to your appointment time to allow for check-in.  Your nurse visit will consist of discussing your medical and surgical history, your immediate family medical history, and your medications.    Please bring a complete list of all your medications or, if you prefer, bring the medication bottles and we will list them.  We will need to be aware of both prescribed and over the counter drugs.  We will need to know exact dosage information as well.  If you are on blood thinners (Coumadin, Plavix, Aggrenox, Ticlid, etc.) please call our office today/prior to your appointment, as we need to consult with your physician about holding your medication.   Please be prepared to read and sign documents such as consent forms, a financial agreement, and acknowledgement forms.  If necessary, and with your consent, a friend or relative is welcome to sit-in on the nurse visit with you.  Please bring your insurance card so that we may make a copy of it.  If your insurance requires a referral to see a specialist, please bring your referral form from your primary care physician.  No co-pay is required for this nurse visit.     If you cannot keep your appointment, please call [phone] to cancel or reschedule prior to your appointment date.  This allows [place] the opportunity to schedule an appointment for another patient in need of care.    Thank you for choosing [facility] for your medical needs.  We  appreciate the opportunity to care for you.  Please visit [place] at our website  to learn more about our practice.                     Sincerely.                                                                                                                   The Gastroenterology Division

## 2010-07-22 NOTE — Procedures (Signed)
Summary: Colonoscopy  Patient: [name] Note: All result statuses are Final unless otherwise noted.  Tests: (1) Colonoscopy (COL)   COL Colonoscopy           DONE     [facility]     [address]     [place], [place]  [address]           COLONOSCOPY PROCEDURE REPORT           PATIENT:  Joanna Hood, Joanna Hood  MR#:  [id number]     BIRTHDATE:  [birth year], 63 yrs. old  GENDER:  female     ENDOSCOPIST:  Wilhemina Bonito. Eda Keys, MD     REF. BY:  Surveillance Program Recall,     PROCEDURE DATE:  02/12/2010     PROCEDURE:  Colonoscopy with snare polypectomy x 1     ASA CLASS:  Class II     INDICATIONS:  history of pre-cancerous (adenomatous) colon polyps,     surveillance and high-risk screening ; index exam 8-06 w/ small     adenoma     MEDICATIONS:   Fentanyl 75 mcg IV, Versed 7 mg IV           DESCRIPTION OF PROCEDURE:   After the risks benefits and     alternatives of the procedure were thoroughly explained, informed     consent was obtained.  Digital rectal exam was performed and     revealed no abnormalities.   The LB [id number] endoscope     was introduced through the anus and advanced to the cecum, which     was identified by both the appendix and ileocecal valve, without     limitations.Time to cecum = 3:00 min.  The quality of the prep was     excellent, using MoviPrep.  The instrument was then slowly     withdrawn (time = 12:07 min) as the colon was fully examined.     <<PROCEDUREIMAGES>>           FINDINGS:  A 5mm sessile polyp was found in the proximal ascending     colon. Polyp was snared without cautery. Retrieval was successful.     snare polyp  This was otherwise a normal examination of the colon.     Retroflexed views in the rectum revealed no abnormalities.    The     scope was then withdrawn from the patient and the procedure     completed.           COMPLICATIONS:  None           ENDOSCOPIC IMPRESSION:     1) Sessile polyp in the ascending colon -  removed     2) Otherwise normal examination           RECOMMENDATIONS:     1) Follow up colonoscopy in 5 years           ______________________________     Wilhemina Bonito. Eda Keys, MD           CC:  Julieanne Manson, MD; The Patient           n.     eSIGNED:   Wilhemina Bonito. Eda Keys at 02/12/2010 12:01 PM           [name], [id number]  Note: An exclamation mark (!) indicates a result that was not dispersed into the flowsheet. Document Creation Date: 02/12/2010 12:21 PM _______________________________________________________________________  (1) Order result status: Final Collection or  observation date-time: 02/12/2010 11:53 Requested date-time:  Receipt date-time:  Reported date-time:  Referring Physician:   Ordering Physician: Fransico Setters [phone]) Specimen Source:  Source: [name] Order Number: [id number] Lab site:   Appended Document: Colonoscopy recall     Procedures Next Due Date:    Colonoscopy: 01/2015

## 2010-07-22 NOTE — Assessment & Plan Note (Signed)
Summary: F/U  CHECK WITH NURSE VISIT IN 1 MONTH//GK  Nurse Visit   Vital Signs:  Patient profile:   65 year old female Pulse rate:   68 / minute BP sitting:   146 / 81  (left arm) Cuff size:   regular  Vitals Entered By: [place] Shannon (August 06, 2009 3:11 PM)  Allergies: 1)  Flagyl (Metronidazole)  Orders Added: 1)  Est. Patient Level I [84696]

## 2010-07-22 NOTE — Assessment & Plan Note (Signed)
Summary: CHECK IRON/ OVERALL CHECK//SS   Vital Signs:  Patient profile:   65 year old female Weight:      171.9 pounds Temp:     97.3 degrees F oral Pulse rate:   65 / minute Pulse rhythm:   regular Resp:     16 per minute BP sitting:   151 / 85  (left arm) Cuff size:   regular  Vitals Entered By: Geanie Cooley  (July 05, 2009 11:56 AM) CC: pt here to have iron checked. Pt states she has been having a problem with the hell on her right foot, it hasnt gotten any better. Pt states if she put the brace on at night it helps some. The pain comes and go there;s really not a particular time it hurt, but she has noticed that when she first get up in the morning and standup, thats when she has the pain. Is Patient Diabetic? No  Does patient need assistance? Functional Status Self care Ambulation Normal   CC:  pt here to have iron checked. Pt states she has been having a problem with the hell on her right foot, it hasnt gotten any better. Pt states if she put the brace on at night it helps some. The pain comes and go there;s really not a particular time it hurt, but she has noticed that when she first get up in the morning and standup, and thats when she has the pain.Marland Kitchen  History of Present Illness: 1.  Iron deficiency anemia:  is due for repeat colonscopy secondary to hx of adenomatous polyps found 2006 with Dr. Marina Goodell.  Was off iron pills last check--has since restarted once daily.  Was not able to follow up as requested as her eligibility ran out and she had to reapply.  No melena or hematochezia.  No heartburn or abdominal complaints.    2.  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  Tries to get shoes that do not exacerbate.  Using a what sounds like a simple velcro wrap--thick ACE bandage.   3.  Hypothyroidism:  Taking replacement hormone alone now.  Taking regularly.  4.  Hypertension:  States taking meds  Allergies (verified): 1)  Flagyl  (Metronidazole)  Physical Exam  General:  NAD Lungs:  Normal respiratory effort, chest expands symmetrically. Lungs are clear to auscultation, no crackles or wheezes. Heart:  Normal rate and regular rhythm. S1 and S2 normal without gallop, murmur, click, rub or other extra sounds.  Radial pulses normal and equal Extremities:  Tender over achilles tendon insertion, posterior right heel.  Mild swelling and hypertrophy of this area.   Impression & Recommendations:  Problem # 1:  ACHILLES TENDINITIS, MILD (ICD-726.71) Will ask to send to Sports Medicine if unable to improve situation Orders: Physical Therapy Referral (PT)  Problem # 2:  HYPERTENSION (ICD-401.9) Recheck BP in 1 month before making a change Her updated medication list for this problem includes:    Hyzaar 100-25 Mg Tabs (Losartan potassium-hctz) .Marland Kitchen... Take 1 tablet by mouth once a day    Norvasc 10 Mg Tabs (Amlodipine besylate) .Marland Kitchen... 1 tab by mouth daily    Benazepril Hcl 40 Mg Tabs (Benazepril hcl) .Marland Kitchen... 1 tab by mouth daily    Toprol Xl 50 Mg Xr24h-tab (Metoprolol succinate) .Marland Kitchen... 1 tab by mouth daily  Problem # 3:  ANEMIA, DEFICIENCY, HX OF (ICD-V12.3) Get set back up with Dr. Marina Goodell for colonoscopy as well. Orders: T-CBC w/Diff ([id number])  Problem # 4:  HYPOTHYROIDISM (ICD-244.9)  Her updated medication list for this problem includes:    Synthroid 150 Mcg Tabs (Levothyroxine sodium) .Marland Kitchen... 1 tab by mouth daily  Orders: T-TSH ([id number])  Complete Medication List: 1)  Klor-con 10 10 Meq Tbcr (Potassium chloride) .... 2 tablets daily for potassium 2)  Hyzaar 100-25 Mg Tabs (Losartan potassium-hctz) .... Take 1 tablet by mouth once a day 3)  Norvasc 10 Mg Tabs (Amlodipine besylate) .Marland Kitchen.. 1 tab by mouth daily 4)  Benazepril Hcl 40 Mg Tabs (Benazepril hcl) .Marland Kitchen.. 1 tab by mouth daily 5)  Celebrex 200 Mg Caps (Celecoxib) .Marland Kitchen.. 1 cap by mouth daily as needed wrist pain 6)  Toprol Xl 50 Mg Xr24h-tab (Metoprolol  succinate) .Marland Kitchen.. 1 tab by mouth daily 7)  Synthroid 150 Mcg Tabs (Levothyroxine sodium) .Marland Kitchen.. 1 tab by mouth daily 8)  Ferrous Sulfate 325 (65 Fe) Mg Tabs (Ferrous sulfate) .Marland Kitchen.. 1 tab by mouth daily  Other Orders: Flu Vaccine 54yrs + ([address]11) Admin 1st Vaccine (91478) Admin 1st Vaccine [facility]) [phone]) Gastroenterology Referral (GI)  Patient Instructions: 1)  BP check with nurse visit in 1 month 2)  CPP with Dr. Delrae Alfred in 6 months Prescriptions: FERROUS SULFATE 325 (65 FE) MG TABS (FERROUS SULFATE) 1 tab by mouth daily  #30 x 4   Entered and Authorized by:   Julieanne Manson MD   Signed by:   Julieanne Manson MD on 07/05/2009   Method used:   Faxed to ...       [facility] - [facility] (retail)       [address] [place], [place]  [address]       Ph: [phone]       Fax: [phone]   RxID:   [id number] SYNTHROID 150 MCG TABS (LEVOTHYROXINE SODIUM) 1 tab by mouth daily  #30 x 11   Entered and Authorized by:   Julieanne Manson MD   Signed by:   Julieanne Manson MD on 07/05/2009   Method used:   Faxed to ...       [facility] - [facility] (retail)       [address] [place], [place]  [address]       Ph: [phone]       Fax: [phone]   RxID:   [id number] TOPROL XL 50 MG XR24H-TAB (METOPROLOL SUCCINATE) 1 tab by mouth daily  #30 x 11   Entered and Authorized by:   Julieanne Manson MD   Signed by:   Julieanne Manson MD on 07/05/2009   Method used:   Faxed to ...       [facility] - [facility] (retail)       [address] [place], [place]  [address]       Ph: [phone]       Fax: [phone]   RxID:   [id number] BENAZEPRIL HCL 40 MG  TABS (BENAZEPRIL HCL) 1 tab by mouth daily  #30 x 11   Entered and Authorized by:   Julieanne Manson MD   Signed by:   Julieanne Manson MD on 07/05/2009   Method used:   Faxed to ...       [facility] - Pharmac (retail)       [address] [place], [place]  [address]       Ph: [phone]  Fax: [phone]   RxID:   [id number] NORVASC 10 MG  TABS (AMLODIPINE BESYLATE) 1 tab by mouth daily  #30 x 11   Entered and Authorized by:   Julieanne Manson MD   Signed by:   Julieanne Manson MD on 07/05/2009   Method used:   Faxed to ...       [facility] - Pharmac (retail)       [address] [place], [place]  [address]       Ph: [phone]       Fax: [phone]   RxID:   [id number] HYZAAR 100-25 MG  TABS (LOSARTAN POTASSIUM-HCTZ) Take 1 tablet by mouth once a day  #30 x 11   Entered and Authorized by:   Julieanne Manson MD   Signed by:   Julieanne Manson MD on 07/05/2009   Method used:   Faxed to ...       [facility] - Pharmac (retail)       [address] [place], [place]  [address]       Ph: [phone]       Fax: [phone]   RxID:   [id number] KLOR-CON 10 10 MEQ  TBCR (POTASSIUM CHLORIDE) 2 tablets daily for potassium  #60 x 11   Entered and Authorized by:   Julieanne Manson MD   Signed by:   Julieanne Manson MD on 07/05/2009   Method used:   Faxed to ...       [facility] - Pharmac (retail)       [address] [place], [place]  [address]       Ph: [phone]       Fax: [phone]   RxID:   [id number]    Vital Signs:  Patient profile:   65 year old female Weight:      171.9 pounds Temp:     97.3 degrees F oral Pulse rate:   65 / minute Pulse rhythm:   regular Resp:     16 per minute BP sitting:   151 / 85  (left arm) Cuff size:   regular  Vitals Entered By: Geanie Cooley  (July 05, 2009 11:56 AM)   Influenza Vaccine    Vaccine Type: Fluvax 3+    Site: right deltoid    Mfr: Sanofi Pasteur    Dose: 0.5 ml    Route: IM    Given by: Geanie Cooley     Exp. Date:  12/19/2009    Lot #: [id number]    VIS given: 01/13/07 version given July 05, 2009.  Flu Vaccine Consent Questions    Do you have a history of severe allergic reactions to this vaccine? no    Any prior history of allergic reactions to egg and/or gelatin? no    Do you have a sensitivity to the preservative Thimersol? no    Do you have a past history of Guillan-Barre Syndrome? no    Do you currently have an acute febrile illness? no    Have you ever had a severe reaction to latex? no    Vaccine information given and explained to patient? yes    Are you currently pregnant? no

## 2010-07-22 NOTE — Miscellaneous (Signed)
Summary: DXA documentation  Clinical Lists Changes  Problems: Added new problem of OSTEOPENIA (ICD-733.90) Observations: Added new observation of BONE DENSITY: Osteopenia:  T score, AP spine:  0.7;  T score, left femur neck:  -1.5;  Tscore, right femur neck:  -2.0 (12/11/2009 15:12)

## 2010-07-22 NOTE — Progress Notes (Signed)
  Phone Note Outgoing Call   Summary of Call: Joanna Hood--see GI referral order.  Pt. known to Dr. Princella Pellegrini performed colonoscopy in 2006.   Initial call taken by: Julieanne Manson MD,  December 03, 2009 10:35 AM

## 2010-07-22 NOTE — Letter (Signed)
Summary: REFERERAL/GASTROENTEROLOGY/APPT DATYE & TIME  REFERERAL/GASTROENTEROLOGY/APPT DATYE & TIME   Imported By: Arta Bruce 12/31/2009 10:22:13  _____________________________________________________________________  External Attachment:    Type:   Image     Comment:   External Document

## 2010-07-22 NOTE — Assessment & Plan Note (Signed)
Summary: FU FOR BP CHECK AND PULSE IN 2 WEEKS//GK  Nurse Visit   Vital Signs:  Patient profile:   65 year old female Menstrual status:  hysterectomy Pulse rate:   65 / minute Pulse rhythm:   regular BP sitting:   149 / 80  (right arm) Cuff size:   regular  Vitals Entered By: Dutch Quint RN (December 25, 2009 9:55 AM) Confirmed with Dr. Delrae Alfred  Patient Instructions: 1)  Take blood pressure once a day, if possibe, for two weeks. 2)  Drop off written log in two weeks.  CC: f/u BP and pulse Comments Denies headache, dizziness, visual changes or peripheral edema   Allergies: 1)  Flagyl (Metronidazole)  Appended Document: FU FOR BP CHECK AND PULSE IN 2 WEEKS//GK    Clinical Lists Changes  Orders: Added new Service order of Est. Patient Level I (57846) - Signed

## 2010-07-22 NOTE — Progress Notes (Signed)
Summary: Office Visit//DEPRESSION SCREENING  Office Visit//DEPRESSION SCREENING   Imported By: Arta Bruce 12/04/2009 09:56:51  _____________________________________________________________________  External Attachment:    Type:   Image     Comment:   External Document

## 2010-07-22 NOTE — Miscellaneous (Signed)
Summary: Levothroid changed to Levothyroxine  Clinical Lists Changes  Medications: Changed medication from SYNTHROID 150 MCG TABS (LEVOTHYROXINE SODIUM) 1 tab by mouth daily to LEVOTHYROXINE SODIUM 150 MCG TABS (LEVOTHYROXINE SODIUM) 1 tab by mouth daily

## 2010-07-25 NOTE — Letter (Signed)
Summary: TEST ORDER FORM//DEXA SCAN  TEST ORDER FORM//DEXA SCAN   Imported By: Arta Bruce 12/04/2009 09:19:16  _____________________________________________________________________  External Attachment:    Type:   Image     Comment:   External Document

## 2010-11-04 NOTE — Procedures (Signed)
NAME:  Joanna Hood, Joanna Hood                ACCOUNT NO.:  [id number]   MEDICAL RECORD NO.:  [id number]          PATIENT TYPE:  OUT   LOCATION:  SLEEP CENTER                 FACILITY:  [facility]   PHYSICIAN:  Clinton D. Maple Hudson, MD, FCCP, FACPDATE OF BIRTH:  [birth year] 19[birth year] 1947   DATE OF STUDY:  01/24/2008                            NOCTURNAL POLYSOMNOGRAM   REFERRING PHYSICIAN:  Marcene Duos, M.D.   INDICATION FOR STUDY:  Hypersomnia with sleep apnea.   EPWORTH SLEEPINESS SCORE:  8/24.   BMI:  28.2.   WEIGHT:  172 pounds.   HEIGHT:  65 inches.   NECK:  14 inches.   MEDICATIONS:  Home medications are charted and reviewed.   SLEEP ARCHITECTURE:  Total sleep time 297.5 minutes with sleep  efficiency 66.8%.  Stage 1 was 6.6%, stage 2 was 75.8%, stage 3 absent,  REM 17.6% of total sleep time.  Sleep latency 63 minutes, REM latency  163 minutes.  Awake after sleep onset 84 minutes.  Arousal index 7.3.  No bedtime medication was taken.   RESPIRATORY DATA:  Apnea/hypopnea index (AHI) 3.8 per hour with  respiratory disturbance index (RDI) 7.1 per hour.  A total of 19 events  was scored, including 9 obstructive apneas, 1 central apnea and 9  hypopneas.  Events were more common while supine.  REM AHI 10.3.  There  were insufficient events to permit split night CPAP titration by  protocol.   OXYGEN DATA:  Moderate snoring with oxygen desaturation to a nadir of  85%.  Mean oxygen saturation through the study was 98.4% on room air.   CARDIAC DATA:  Frequent PACs and PVCs.   MOVEMENT/PARASOMNIA:  No significant movement disturbance.  Bathroom x2.   IMPRESSION/RECOMMENDATION:  1. Occasional respiratory events disturbing sleep, AHI 3.8 per hour.      Most events were while supine (normal range is 0 to 5 per hour).      Moderate snoring with oxygen desaturation to a nadir of 85%.  2. Scores in this range are not usually addressed with CPAP.  It may      help to encourage sleep off flat of back, to      treat for any significant nasal or upper airway obstruction, to      keep weight down.  Total sleep time with oxygen saturation less      than 88% was only 0.4 minutes, so nocturnal oxygen would not be      indicated.      Clinton D. Maple Hudson, MD, [facility], FACP  Diplomate, Biomedical engineer of Sleep Medicine  Electronically Signed     CDY/MEDQ  D:  01/28/2008 10:35:04  T:  01/28/2008 12:22:07  Job:  [id number]

## 2012-03-03 ENCOUNTER — Other Ambulatory Visit (HOSPITAL_COMMUNITY): Payer: Self-pay | Admitting: Internal Medicine

## 2012-03-03 DIAGNOSIS — Z1231 Encounter for screening mammogram for malignant neoplasm of breast: Secondary | ICD-10-CM

## 2012-03-16 ENCOUNTER — Ambulatory Visit (HOSPITAL_COMMUNITY)
Admission: RE | Admit: 2012-03-16 | Discharge: 2012-03-16 | Disposition: A | Discharge status: Home / Self Care | Payer: Self-pay | Source: Ambulatory Visit | Attending: Internal Medicine | Admitting: Internal Medicine

## 2012-03-16 DIAGNOSIS — Z1231 Encounter for screening mammogram for malignant neoplasm of breast: Secondary | ICD-10-CM

## 2012-06-22 DIAGNOSIS — I4891 Unspecified atrial fibrillation: Secondary | ICD-10-CM

## 2012-06-22 HISTORY — DX: Unspecified atrial fibrillation: I48.91

## 2012-06-22 HISTORY — PX: CARDIAC CATHETERIZATION: SHX172

## 2013-04-29 ENCOUNTER — Encounter (HOSPITAL_COMMUNITY): Payer: Self-pay | Admitting: Emergency Medicine

## 2013-04-29 ENCOUNTER — Emergency Department (HOSPITAL_COMMUNITY): Payer: Medicare Other

## 2013-04-29 ENCOUNTER — Inpatient Hospital Stay (HOSPITAL_COMMUNITY)
Admission: EM | Admit: 2013-04-29 | Discharge: 2013-05-03 | DRG: 286 | Disposition: A | Discharge status: Home / Self Care | Payer: Medicare Other | Attending: Internal Medicine | Admitting: Internal Medicine

## 2013-04-29 DIAGNOSIS — M7660 Achilles tendinitis, unspecified leg: Secondary | ICD-10-CM

## 2013-04-29 DIAGNOSIS — E876 Hypokalemia: Secondary | ICD-10-CM | POA: Diagnosis present

## 2013-04-29 DIAGNOSIS — Z862 Personal history of diseases of the blood and blood-forming organs and certain disorders involving the immune mechanism: Secondary | ICD-10-CM

## 2013-04-29 DIAGNOSIS — I498 Other specified cardiac arrhythmias: Secondary | ICD-10-CM | POA: Diagnosis present

## 2013-04-29 DIAGNOSIS — R079 Chest pain, unspecified: Secondary | ICD-10-CM | POA: Diagnosis present

## 2013-04-29 DIAGNOSIS — D649 Anemia, unspecified: Secondary | ICD-10-CM | POA: Diagnosis present

## 2013-04-29 DIAGNOSIS — Z87891 Personal history of nicotine dependence: Secondary | ICD-10-CM

## 2013-04-29 DIAGNOSIS — I4891 Unspecified atrial fibrillation: Secondary | ICD-10-CM | POA: Diagnosis present

## 2013-04-29 DIAGNOSIS — K222 Esophageal obstruction: Secondary | ICD-10-CM

## 2013-04-29 DIAGNOSIS — M65839 Other synovitis and tenosynovitis, unspecified forearm: Secondary | ICD-10-CM

## 2013-04-29 DIAGNOSIS — Z91199 Patient's noncompliance with other medical treatment and regimen due to unspecified reason: Secondary | ICD-10-CM

## 2013-04-29 DIAGNOSIS — E039 Hypothyroidism, unspecified: Secondary | ICD-10-CM | POA: Diagnosis present

## 2013-04-29 DIAGNOSIS — I509 Heart failure, unspecified: Secondary | ICD-10-CM | POA: Diagnosis present

## 2013-04-29 DIAGNOSIS — I10 Essential (primary) hypertension: Secondary | ICD-10-CM | POA: Diagnosis present

## 2013-04-29 DIAGNOSIS — J9600 Acute respiratory failure, unspecified whether with hypoxia or hypercapnia: Secondary | ICD-10-CM | POA: Diagnosis present

## 2013-04-29 DIAGNOSIS — Z9119 Patient's noncompliance with other medical treatment and regimen: Secondary | ICD-10-CM

## 2013-04-29 DIAGNOSIS — I480 Paroxysmal atrial fibrillation: Secondary | ICD-10-CM

## 2013-04-29 DIAGNOSIS — I161 Hypertensive emergency: Secondary | ICD-10-CM

## 2013-04-29 DIAGNOSIS — I5031 Acute diastolic (congestive) heart failure: Principal | ICD-10-CM | POA: Diagnosis present

## 2013-04-29 DIAGNOSIS — R7309 Other abnormal glucose: Secondary | ICD-10-CM

## 2013-04-29 DIAGNOSIS — R0602 Shortness of breath: Secondary | ICD-10-CM | POA: Diagnosis present

## 2013-04-29 DIAGNOSIS — Z780 Asymptomatic menopausal state: Secondary | ICD-10-CM

## 2013-04-29 DIAGNOSIS — I059 Rheumatic mitral valve disease, unspecified: Secondary | ICD-10-CM

## 2013-04-29 DIAGNOSIS — E042 Nontoxic multinodular goiter: Secondary | ICD-10-CM

## 2013-04-29 DIAGNOSIS — R0609 Other forms of dyspnea: Secondary | ICD-10-CM

## 2013-04-29 DIAGNOSIS — Z79899 Other long term (current) drug therapy: Secondary | ICD-10-CM

## 2013-04-29 DIAGNOSIS — E785 Hyperlipidemia, unspecified: Secondary | ICD-10-CM | POA: Diagnosis present

## 2013-04-29 DIAGNOSIS — I4819 Other persistent atrial fibrillation: Secondary | ICD-10-CM | POA: Diagnosis present

## 2013-04-29 DIAGNOSIS — R5381 Other malaise: Secondary | ICD-10-CM

## 2013-04-29 DIAGNOSIS — J811 Chronic pulmonary edema: Secondary | ICD-10-CM

## 2013-04-29 DIAGNOSIS — R82998 Other abnormal findings in urine: Secondary | ICD-10-CM

## 2013-04-29 DIAGNOSIS — L538 Other specified erythematous conditions: Secondary | ICD-10-CM

## 2013-04-29 DIAGNOSIS — I249 Acute ischemic heart disease, unspecified: Secondary | ICD-10-CM | POA: Diagnosis present

## 2013-04-29 DIAGNOSIS — Z86010 Personal history of colonic polyps: Secondary | ICD-10-CM

## 2013-04-29 DIAGNOSIS — J852 Abscess of lung without pneumonia: Secondary | ICD-10-CM

## 2013-04-29 DIAGNOSIS — M899 Disorder of bone, unspecified: Secondary | ICD-10-CM

## 2013-04-29 HISTORY — DX: Shortness of breath: R06.02

## 2013-04-29 LAB — CBC WITH DIFFERENTIAL/PLATELET
Basophils Relative: 1 % (ref 0–1)
Basophils Relative: 1 % (ref 0–1)
Eosinophils Absolute: 0.1 10*3/uL (ref 0.0–0.7)
HCT: 33 % — ABNORMAL LOW (ref 36.0–46.0)
Hemoglobin: 11.1 g/dL — ABNORMAL LOW (ref 12.0–15.0)
Lymphocytes Relative: 41 % (ref 12–46)
Lymphs Abs: 2.1 10*3/uL (ref 0.7–4.0)
Lymphs Abs: 1.4 10*3/uL (ref 0.7–4.0)
MCH: 24.7 pg — ABNORMAL LOW (ref 26.0–34.0)
MCHC: 33.6 g/dL (ref 30.0–36.0)
MCV: 73.9 fL — ABNORMAL LOW (ref 78.0–100.0)
Monocytes Relative: 7 % (ref 3–12)
Monocytes Relative: 9 % (ref 3–12)
Neutro Abs: 2.5 10*3/uL (ref 1.7–7.7)
Neutro Abs: 3.5 10*3/uL (ref 1.7–7.7)
Neutrophils Relative %: 64 % (ref 43–77)
Platelets: 163 10*3/uL (ref 150–400)
RBC: 4.79 MIL/uL (ref 3.87–5.11)
RBC: 4.49 MIL/uL (ref 3.87–5.11)
RDW: 14.6 % (ref 11.5–15.5)

## 2013-04-29 LAB — URINALYSIS, ROUTINE W REFLEX MICROSCOPIC
Bilirubin Urine: NEGATIVE
Ketones, ur: NEGATIVE mg/dL
Protein, ur: 100 mg/dL — AB
Specific Gravity, Urine: 1.007 (ref 1.005–1.030)
Urobilinogen, UA: 0.2 mg/dL (ref 0.0–1.0)

## 2013-04-29 LAB — BASIC METABOLIC PANEL
Calcium: 9 mg/dL (ref 8.4–10.5)
GFR calc Af Amer: >90 mL/min (ref >90)
GFR calc non Af Amer: 87 mL/min — ABNORMAL LOW (ref >90)
Glucose, Bld: 110 mg/dL — ABNORMAL HIGH (ref 70–99)
Sodium: 137 mEq/L (ref 135–145)

## 2013-04-29 LAB — POCT I-STAT, CHEM 8
Creatinine, Ser: 1.1 mg/dL (ref 0.50–1.10)
HCT: 41 % (ref 36.0–46.0)
Hemoglobin: 13.9 g/dL (ref 12.0–15.0)
Potassium: 3.2 mEq/L — ABNORMAL LOW (ref 3.5–5.1)
Sodium: 141 mEq/L (ref 135–145)
TCO2: 28 mmol/L (ref 0–100)

## 2013-04-29 LAB — CK TOTAL AND CKMB (NOT AT ARMC)
CK, MB: 6.6 ng/mL (ref 0.3–4.0)
Relative Index: 2.9 — ABNORMAL HIGH (ref 0.0–2.5)
Total CK: 229 U/L — ABNORMAL HIGH (ref 7–177)

## 2013-04-29 LAB — URINE MICROSCOPIC-ADD ON

## 2013-04-29 LAB — POCT I-STAT TROPONIN I: Troponin i, poc: 0.01 ng/mL (ref 0.00–0.08)

## 2013-04-29 LAB — MAGNESIUM: Magnesium: 1.7 mg/dL (ref 1.5–2.5)

## 2013-04-29 LAB — MRSA PCR SCREENING: MRSA by PCR: NEGATIVE

## 2013-04-29 LAB — PRO B NATRIURETIC PEPTIDE: Pro B Natriuretic peptide (BNP): 2162 pg/mL — ABNORMAL HIGH (ref 0–125)

## 2013-04-29 LAB — TROPONIN I: Troponin I: <0.3 ng/mL (ref <0.30)

## 2013-04-29 MED ORDER — RAMIPRIL 5 MG PO CAPS
5.0000 mg | ORAL_CAPSULE | Freq: Two times a day (BID) | ORAL | Status: DC
  Filled 2013-04-29 (×2): qty 1

## 2013-04-29 MED ORDER — SODIUM CHLORIDE 0.9 % IV SOLN: 250.0000 mL | INTRAVENOUS | Status: DC | PRN

## 2013-04-29 MED ORDER — POTASSIUM CHLORIDE CRYS ER 20 MEQ PO TBCR
40.0000 meq | EXTENDED_RELEASE_TABLET | Freq: Once | ORAL | Status: AC
  Administered 2013-04-29: 40 meq via ORAL
  Filled 2013-04-29: qty 2

## 2013-04-29 MED ORDER — ENOXAPARIN SODIUM 80 MG/0.8ML ~~LOC~~ SOLN
75.0000 mg | Freq: Two times a day (BID) | SUBCUTANEOUS | Status: DC
  Administered 2013-04-29: 21:00:00 via SUBCUTANEOUS
  Administered 2013-04-29: 75 mg via SUBCUTANEOUS
  Filled 2013-04-29 (×5): qty 0.8

## 2013-04-29 MED ORDER — ASPIRIN EC 81 MG PO TBEC
81.0000 mg | DELAYED_RELEASE_TABLET | Freq: Every day | ORAL | Status: DC
  Administered 2013-04-29 – 2013-04-30 (×2): 81 mg via ORAL
  Filled 2013-04-29 (×2): qty 1

## 2013-04-29 MED ORDER — DILTIAZEM HCL 100 MG IV SOLR
5.0000 mg/h | INTRAVENOUS | Status: DC
  Administered 2013-04-29: 5 mg/h via INTRAVENOUS
  Filled 2013-04-29 (×2): qty 100

## 2013-04-29 MED ORDER — ALBUTEROL SULFATE (5 MG/ML) 0.5% IN NEBU
2.5000 mg | INHALATION_SOLUTION | Freq: Once | RESPIRATORY_TRACT | Status: AC
  Administered 2013-04-29: 5 mg via RESPIRATORY_TRACT

## 2013-04-29 MED ORDER — ALBUTEROL SULFATE (5 MG/ML) 0.5% IN NEBU: 2.5000 mg | INHALATION_SOLUTION | RESPIRATORY_TRACT | Status: AC | PRN

## 2013-04-29 MED ORDER — INFLUENZA VAC SPLIT QUAD 0.5 ML IM SUSP
0.5000 mL | INTRAMUSCULAR | Status: AC
  Administered 2013-04-30: 0.5 mL via INTRAMUSCULAR
  Filled 2013-04-29: qty 0.5

## 2013-04-29 MED ORDER — DILTIAZEM HCL 25 MG/5ML IV SOLN
10.0000 mg | Freq: Once | INTRAVENOUS | Status: AC
  Administered 2013-04-29: 10 mg via INTRAVENOUS

## 2013-04-29 MED ORDER — POTASSIUM CHLORIDE 10 MEQ/100ML IV SOLN
10.0000 meq | INTRAVENOUS | Status: AC
  Administered 2013-04-29 (×2): 10 meq via INTRAVENOUS
  Filled 2013-04-29 (×2): qty 100

## 2013-04-29 MED ORDER — PANTOPRAZOLE SODIUM 40 MG PO TBEC
40.0000 mg | DELAYED_RELEASE_TABLET | Freq: Every day | ORAL | Status: DC
  Administered 2013-04-29 – 2013-05-03 (×5): 40 mg via ORAL
  Filled 2013-04-29 (×5): qty 1

## 2013-04-29 MED ORDER — METOPROLOL TARTRATE 12.5 MG HALF TABLET
12.5000 mg | ORAL_TABLET | Freq: Two times a day (BID) | ORAL | Status: DC
  Filled 2013-04-29 (×2): qty 1

## 2013-04-29 MED ORDER — PNEUMOCOCCAL VAC POLYVALENT 25 MCG/0.5ML IJ INJ
0.5000 mL | INJECTION | INTRAMUSCULAR | Status: AC
  Administered 2013-04-30: 0.5 mL via INTRAMUSCULAR
  Filled 2013-04-29: qty 0.5

## 2013-04-29 MED ORDER — LEVOTHYROXINE SODIUM 100 MCG PO TABS
100.0000 ug | ORAL_TABLET | Freq: Every day | ORAL | Status: DC
  Administered 2013-04-29 – 2013-04-30 (×2): 100 ug via ORAL
  Filled 2013-04-29 (×3): qty 1

## 2013-04-29 MED ORDER — METOPROLOL TARTRATE 25 MG PO TABS
25.0000 mg | ORAL_TABLET | Freq: Two times a day (BID) | ORAL | Status: DC
  Administered 2013-04-29 – 2013-05-02 (×7): 25 mg via ORAL
  Filled 2013-04-29 (×6): qty 1

## 2013-04-29 MED ORDER — ASPIRIN 81 MG PO CHEW
324.0000 mg | CHEWABLE_TABLET | Freq: Once | ORAL | Status: AC
  Administered 2013-04-29: 324 mg via ORAL
  Filled 2013-04-29: qty 4

## 2013-04-29 MED ORDER — FUROSEMIDE 40 MG PO TABS
40.0000 mg | ORAL_TABLET | Freq: Two times a day (BID) | ORAL | Status: DC
  Administered 2013-04-29: 40 mg via ORAL
  Filled 2013-04-29 (×3): qty 1

## 2013-04-29 MED ORDER — AMLODIPINE BESYLATE 10 MG PO TABS
10.0000 mg | ORAL_TABLET | Freq: Every day | ORAL | Status: DC
  Administered 2013-04-29: 10 mg via ORAL
  Filled 2013-04-29 (×2): qty 1

## 2013-04-29 MED ORDER — RAMIPRIL 10 MG PO CAPS
10.0000 mg | ORAL_CAPSULE | Freq: Two times a day (BID) | ORAL | Status: DC
  Administered 2013-04-29 – 2013-05-03 (×8): 10 mg via ORAL
  Filled 2013-04-29 (×9): qty 1

## 2013-04-29 MED ORDER — DEXTROSE 5 % IV SOLN
5.0000 mg/h | Freq: Once | INTRAVENOUS | Status: AC
  Administered 2013-04-29: 5 mg/h via INTRAVENOUS
  Filled 2013-04-29: qty 100

## 2013-04-29 MED ORDER — ONDANSETRON HCL 4 MG/2ML IJ SOLN: 4.0000 mg | Freq: Four times a day (QID) | INTRAMUSCULAR | Status: DC | PRN

## 2013-04-29 MED ORDER — SODIUM CHLORIDE 0.9 % IJ SOLN
3.0000 mL | Freq: Two times a day (BID) | INTRAMUSCULAR | Status: DC
  Administered 2013-04-30 – 2013-05-02 (×5): 3 mL via INTRAVENOUS

## 2013-04-29 MED ORDER — NITROGLYCERIN IN D5W 200-5 MCG/ML-% IV SOLN
5.0000 ug/min | INTRAVENOUS | Status: DC
  Administered 2013-04-29: 5 ug/min via INTRAVENOUS
  Filled 2013-04-29: qty 250

## 2013-04-29 MED ORDER — POTASSIUM CHLORIDE CRYS ER 20 MEQ PO TBCR
40.0000 meq | EXTENDED_RELEASE_TABLET | Freq: Two times a day (BID) | ORAL | Status: DC
  Administered 2013-04-29 (×2): 40 meq via ORAL
  Filled 2013-04-29 (×3): qty 2

## 2013-04-29 MED ORDER — ALBUTEROL SULFATE (5 MG/ML) 0.5% IN NEBU
INHALATION_SOLUTION | RESPIRATORY_TRACT | Status: AC
  Filled 2013-04-29: qty 1

## 2013-04-29 MED ORDER — SODIUM CHLORIDE 0.9 % IJ SOLN: 3.0000 mL | INTRAMUSCULAR | Status: DC | PRN

## 2013-04-29 MED ORDER — ACETAMINOPHEN 325 MG PO TABS: 650.0000 mg | ORAL_TABLET | ORAL | Status: DC | PRN

## 2013-04-29 MED ORDER — FUROSEMIDE 10 MG/ML IJ SOLN
20.0000 mg | Freq: Once | INTRAMUSCULAR | Status: AC
  Administered 2013-04-29: 20 mg via INTRAVENOUS
  Filled 2013-04-29: qty 2

## 2013-04-29 NOTE — Consult Note (Signed)
Reason for Consult: chest pain, A fib with RVR  Referring Physician: Dr. Adela Glimpse   No PCP Per Patient Primary Cardiologist:New  Joanna Hood is an 67 y.o. female.    Chief Complaint:  Chest pain , SOB admitted early this AM   HPI: 67 year old female with a past medical history of Hypertension and Thyroid disease, presented with 1 week hx of worsening shortness of breath and trouble laying down flat. Denies any chest pain or headache. She has been out of her meds for the past 1 month. She currently has no PCP. Denies significant leg swelling but she has had cough when she lays down. She has never been seen by cardiology. She presented to ER due to not being able to breathe and found to be in a.fib w RVR HR in 140's, HR  Decreased down to 72 on diltiazem gtt her blood pressure was noted to be elevated in 180's systolic. Pt was unaware she had any tachycardia.  After receiving lasix and being started on nitro gtt patient has diuresed and is feeling better. Initially she needed oxygen but currently satting 99% on 2 L.   Now with controlled rate on 5 mg IV cardizem, IV NTG for HTN and her K+ is 2.7 will give 1 run IV if pt can tolerate but have ordered 80 meq in divided doses.  Initial Troponin POC was negative.  Pro BNP 2162.  EKG in ER a fib with RVR and inf lat T wave depression.  Today EKG with Qtc 550 ms. A fib/flutter rate controlled with deep t wave inversion V2, V3, V4, and V5-6.     Past Medical History  Diagnosis Date  . Hypertension   . Thyroid disease   . SOB (shortness of breath) 04/29/2013    Past Surgical History  Procedure Laterality Date  . Partial hysterectomy      Family History  Problem Relation Age of Onset  . Heart disease Mother   . Hypertension Sister   . Diabetes type II Sister    Social History:  reports that she has quit smoking. She does not have any smokeless tobacco history on file. She reports that she does not drink alcohol or use  illicit drugs.  Allergies:  Allergies  Allergen Reactions  . Metronidazole     REACTION: Rash, itching    Medications Prior to Admission  Medication Sig Dispense Refill  . amLODipine (NORVASC) 10 MG tablet Take 10 mg by mouth daily.      Marland Kitchen levothyroxine (SYNTHROID, LEVOTHROID) 100 MCG tablet Take 100 mcg by mouth daily before breakfast.        Results for orders placed during the hospital encounter of 04/29/13 (from the past 48 hour(s))  CBC WITH DIFFERENTIAL     Status: Abnormal   Collection Time    04/29/13  2:08 AM      Result Value Range   WBC 5.2  4.0 - 10.5 K/uL   RBC 4.79  3.87 - 5.11 MIL/uL   Hemoglobin 11.9 (*) 12.0 - 15.0 g/dL   HCT 78.2 (*) 95.6 - 21.3 %   MCV 73.9 (*) 78.0 - 100.0 fL   MCH 24.8 (*) 26.0 - 34.0 pg   MCHC 33.6  30.0 - 36.0 g/dL   RDW 08.6  57.8 - 46.9 %   Platelets 192  150 - 400 K/uL   Neutrophils Relative % 49  43 - 77 %   Lymphocytes Relative 41  12 - 46 %   Monocytes Relative 7  3 - 12 %   Eosinophils Relative 2  0 - 5 %   Basophils Relative 1  0 - 1 %   Neutro Abs 2.5  1.7 - 7.7 K/uL   Lymphs Abs 2.1  0.7 - 4.0 K/uL   Monocytes Absolute 0.4  0.1 - 1.0 K/uL   Eosinophils Absolute 0.1  0.0 - 0.7 K/uL   Basophils Absolute 0.1  0.0 - 0.1 K/uL   Smear Review MORPHOLOGY UNREMARKABLE    POCT I-STAT TROPONIN I     Status: None   Collection Time    04/29/13  2:11 AM      Result Value Range   Troponin i, poc 0.01  0.00 - 0.08 ng/mL   Comment 3            Comment: Due to the release kinetics of cTnI,     a negative result within the first hours     of the onset of symptoms does not rule out     myocardial infarction with certainty.     If myocardial infarction is still suspected,     repeat the test at appropriate intervals.  POCT I-STAT, CHEM 8     Status: Abnormal   Collection Time    04/29/13  2:13 AM      Result Value Range   Sodium 141  135 - 145 mEq/L   Potassium 3.2 (*) 3.5 - 5.1 mEq/L   Chloride 100  96 - 112 mEq/L   BUN 19  6 -  23 mg/dL   Creatinine, Ser 4.09  0.50 - 1.10 mg/dL   Glucose, Bld 811 (*) 70 - 99 mg/dL   Calcium, Ion 9.14  7.82 - 1.30 mmol/L   TCO2 28  0 - 100 mmol/L   Hemoglobin 13.9  12.0 - 15.0 g/dL   HCT 95.6  21.3 - 08.6 %  PRO B NATRIURETIC PEPTIDE     Status: Abnormal   Collection Time    04/29/13  2:18 AM      Result Value Range   Pro B Natriuretic peptide (BNP) 2162.0 (*) 0 - 125 pg/mL  MAGNESIUM     Status: None   Collection Time    04/29/13  2:18 AM      Result Value Range   Magnesium 1.7  1.5 - 2.5 mg/dL  URINALYSIS, ROUTINE W REFLEX MICROSCOPIC     Status: Abnormal   Collection Time    04/29/13  3:23 AM      Result Value Range   Color, Urine YELLOW  YELLOW   APPearance CLEAR  CLEAR   Specific Gravity, Urine 1.007  1.005 - 1.030   pH 6.5  5.0 - 8.0   Glucose, UA NEGATIVE  NEGATIVE mg/dL   Hgb urine dipstick MODERATE (*) NEGATIVE   Bilirubin Urine NEGATIVE  NEGATIVE   Ketones, ur NEGATIVE  NEGATIVE mg/dL   Protein, ur 578 (*) NEGATIVE mg/dL   Urobilinogen, UA 0.2  0.0 - 1.0 mg/dL   Nitrite NEGATIVE  NEGATIVE   Leukocytes, UA SMALL (*) NEGATIVE  URINE MICROSCOPIC-ADD ON     Status: None   Collection Time    04/29/13  3:23 AM      Result Value Range   Squamous Epithelial / LPF RARE  RARE   WBC, UA 0-2  <3 WBC/hpf   RBC / HPF 3-6  <3 RBC/hpf  MRSA PCR SCREENING     Status: None  Collection Time    04/29/13  5:40 AM      Result Value Range   MRSA by PCR NEGATIVE  NEGATIVE   Comment:            The GeneXpert MRSA Assay (FDA     approved for NASAL specimens     only), is one component of a     comprehensive MRSA colonization     surveillance program. It is not     intended to diagnose MRSA     infection nor to guide or     monitor treatment for     MRSA infections.  BASIC METABOLIC PANEL     Status: Abnormal   Collection Time    04/29/13 11:40 AM      Result Value Range   Sodium 137  135 - 145 mEq/L   Potassium 2.7 (*) 3.5 - 5.1 mEq/L   Comment: CRITICAL RESULT  CALLED TO, READ BACK BY AND VERIFIED WITH:     PILANDSRN [id number] MCCAULEG   Chloride 98  96 - 112 mEq/L   CO2 28  19 - 32 mEq/L   Glucose, Bld 110 (*) 70 - 99 mg/dL   BUN 14  6 - 23 mg/dL   Creatinine, Ser 0.45  0.50 - 1.10 mg/dL   Calcium 9.0  8.4 - 40.9 mg/dL   GFR calc non Af Amer 87 (*) >90 mL/min   GFR calc Af Amer >90  >90 mL/min   Comment: (NOTE)     The eGFR has been calculated using the CKD EPI equation.     This calculation has not been validated in all clinical situations.     eGFR's persistently <90 mL/min signify possible Chronic Kidney     Disease.  CBC WITH DIFFERENTIAL     Status: Abnormal   Collection Time    04/29/13 11:40 AM      Result Value Range   WBC 5.5  4.0 - 10.5 K/uL   RBC 4.49  3.87 - 5.11 MIL/uL   Hemoglobin 11.1 (*) 12.0 - 15.0 g/dL   Comment: DELTA CHECK NOTED   HCT 33.0 (*) 36.0 - 46.0 %   MCV 73.5 (*) 78.0 - 100.0 fL   MCH 24.7 (*) 26.0 - 34.0 pg   MCHC 33.6  30.0 - 36.0 g/dL   RDW 81.1  91.4 - 78.2 %   Platelets 163  150 - 400 K/uL   Neutrophils Relative % 64  43 - 77 %   Neutro Abs 3.5  1.7 - 7.7 K/uL   Lymphocytes Relative 26  12 - 46 %   Lymphs Abs 1.4  0.7 - 4.0 K/uL   Monocytes Relative 9  3 - 12 %   Monocytes Absolute 0.5  0.1 - 1.0 K/uL   Eosinophils Relative 1  0 - 5 %   Eosinophils Absolute 0.1  0.0 - 0.7 K/uL   Basophils Relative 1  0 - 1 %   Basophils Absolute 0.0  0.0 - 0.1 K/uL   Dg Chest 2 View  04/29/2013   CLINICAL DATA:  Shortness of breath.  EXAM: CHEST  2 VIEW  COMPARISON:  None.  FINDINGS: The lungs are well-aerated. Vascular congestion is noted, with bilateral airspace opacification and small bilateral pleural effusions, compatible with pulmonary edema. No pneumothorax is seen.  The heart is mildly enlarged. No acute osseous abnormalities are seen.  IMPRESSION: Vascular congestion and mild cardiomegaly, with bilateral airspace opacification and small bilateral pleural effusions, compatible  with pulmonary edema.    Electronically Signed   By: Roanna Raider M.D.   On: 04/29/2013 02:46   2D Echo:  04/29/13 Study Conclusions: - Left ventricle: The cavity size was severely dilated. Wall thickness was normal. Systolic function was normal. The estimated ejection fraction was in the range of 50% to 55%. Wall motion was normal; there were no regional wall motion abnormalities. - Mitral valve: Mild regurgitation. - Left atrium: The atrium was mildly dilated. - Pulmonary arteries: Systolic pressure was mildly increased. PA peak pressure: 40mm Hg (S). - Pericardium, extracardiac: There was a left pleural effusion   ROS: General:no colds or fevers, no weight changes Skin:no rashes or ulcers HEENT:no blurred vision, no congestion CV:see HPI PUL:see HPI GI:no diarrhea constipation or melena, no indigestion, no hx of gi bleed GU:no hematuria, no dysuria MS:no joint pain, no claudication Neuro:no syncope, no lightheadedness Endo:no diabetes, + thyroid disease   Blood pressure 151/91, pulse 61, temperature 97.6 F (36.4 C), temperature source Oral, resp. rate 25, height 5\' 5"  (1.651 m), weight 162 lb 4.1 oz (73.6 kg), SpO2 98.00%. PE: General:Pleasant affect, NAD Skin:Warm and dry, brisk capillary refill HEENT:normocephalic, sclera clear, mucus membranes moist Neck:supple, no JVD, no bruits, no adenopathy  Heart:irreg irreg  With 2/6 systolic murmur, no gallup, rub or click Lungs:diminished in the bases without rales, rhonchi, or wheezes UVO:ZDGU, non tender, + BS, do not palpate liver spleen or masses Ext:no lower ext edema, 2+ pedal pulses, 2+ radial pulses Neuro:alert and oriented, MAE, follows commands, + facial symmetry    Assessment/Plan Principal Problem:   SOB (shortness of breath) Active Problems:   Atrial fibrillation with RVR   HTN (hypertension), malignant   HYPOKALEMIA   CHF, acute   Hypokalemia  PLAN: awaiting follow up markers.  Replacing K+, continue, rec'd one dose of IV Lasix  now on po.  She may benefit from an extra IV dose of Lasix, but with K+ so low will wait.  On Lovenox therapeutic dose.  Echo as above.  On Lopressor 25 BID, and norvasc 10 mg.  Change to po dilt and wean NTG.  Possible lexiscan myoview if enzymes neg. For MI.  Dr. Tresa Endo to see and Marcelline Deist R  Nurse Practitioner Certified [facility] 2 Pager [phone] 04/29/2013, 2:17 PM    Patient seen and examined. Agree with assessment and plan. Very pleasant 67 yo AAF with a h/o htn and throid abnormalities. She had been off her meds for ~1 month and relates 1 week of pnd, orthopnea. She presented last night with AF with RVR at 152. She has been receiving Lasix with diuresis, wasstarted on iv cardizem. Initial CPK 229 with MB 6.6, trop negative. ECG suggests anterolateral and inferior T wave abnormalites. Currently bein repleted with KCl; Mg pending. On lovenox at 75 mg sq every 12 hrs. Will start oral cardizem prior to dc iv cardizem. Suspect may need definitive cardiac catheterization with ECG changes prior to initiation of oral anticoagulation.   Lennette Bihari, MD, [facility] 04/29/2013 5:45 PM

## 2013-04-29 NOTE — H&P (Signed)
PCP: None   Chief Complaint:  Shortness of breath  HPI: Joanna Hood is a 67 y.o. female   has a past medical history of Hypertension and Thyroid disease.   Presented with  1 week hx of worsening shortness of breath and trouble laying down flat. Denies any chest pain or headache. She has been out of her meds for the past 1 month. She currently has no PCP. Denies significant leg swelling but she has had cough when she lays down. She has never been seen by cardiology. She presented to ER with a.fib w RVR HR in 140's currently HR down to 72 on diltiazem gtt her blood pressure was noted to be elevated in 180's. After receiving lasix and being started on nitro gtt patient has diuresed and is feeling better. Initially she needed oxygen but currently satting 99% on 2 L. Hospitalist was called for admission.  Review of Systems:    Pertinent positives include: shortness of breath at rest.   dyspnea on exertion, orthopnea  Constitutional:  No weight loss, night sweats, Fevers, chills, fatigue, weight loss  HEENT:  No headaches, Difficulty swallowing,Tooth/dental problems,Sore throat,  No sneezing, itching, ear ache, nasal congestion, post nasal drip,  Cardio-vascular:  No chest pain, Orthopnea, PND, anasarca, dizziness, palpitations.no Bilateral lower extremity swelling  GI:  No heartburn, indigestion, abdominal pain, nausea, vomiting, diarrhea, change in bowel habits, loss of appetite, melena, blood in stool, hematemesis Resp:  no No excess mucus, no productive cough, No non-productive cough, No coughing up of blood.No change in color of mucus.No wheezing. Skin:  no rash or lesions. No jaundice GU:  no dysuria, change in color of urine, no urgency or frequency. No straining to urinate.  No flank pain.  Musculoskeletal:  No joint pain or no joint swelling. No decreased range of motion. No back pain.  Psych:  No change in mood or affect. No depression or anxiety. No memory loss.   Neuro: no localizing neurological complaints, no tingling, no weakness, no double vision, no gait abnormality, no slurred speech, no confusion  Otherwise ROS are negative except for above, 10 systems were reviewed  Past Medical History: Past Medical History  Diagnosis Date  . Hypertension   . Thyroid disease    Past Surgical History  Procedure Laterality Date  . Partial hysterectomy       Medications: Prior to Admission medications   Medication Sig Start Date End Date Taking? Authorizing Provider  amLODipine (NORVASC) 10 MG tablet Take 10 mg by mouth daily.   Yes Historical Provider, MD  levothyroxine (SYNTHROID, LEVOTHROID) 100 MCG tablet Take 100 mcg by mouth daily before breakfast.   Yes Historical Provider, MD    Allergies:   Allergies  Allergen Reactions  . Metronidazole     REACTION: Rash, itching    Social History:  Ambulatory   independently   Lives at  Home with family   reports that she has quit smoking. She does not have any smokeless tobacco history on file. She reports that she does not drink alcohol or use illicit drugs.   Family History: family history includes Diabetes type II in her sister; Heart disease in her mother; Hypertension in her sister.    Physical Exam: Patient Vitals for the past 24 hrs:  BP Pulse Resp SpO2  04/29/13 0200 185/134 mmHg 152 29 98 %    1. General:  in No Acute distress 2. Psychological: Alert and  Oriented 3. Head/ENT:   Moist  Mucous Membranes  Head Non traumatic, neck supple                          Normal   Dentition 4. SKIN: normal   Skin turgor,  Skin clean Dry and intact no rash 5. Heart: irregular rate and rhythm, systolic Murmur present, Rub or gallop 6. Lungs:  no wheezes occasional crackles  At the bases 7. Abdomen: Soft, non-tender, Non distended 8. Lower extremities: no clubbing, cyanosis, or edema 9. Neurologically Grossly intact, moving all 4 extremities equally 10. MSK:  Normal range of motion  body mass index is unknown because there is no weight on file.   Labs on Admission:   Recent Labs  04/29/13 0213  NA 141  K 3.2*  CL 100  GLUCOSE 149*  BUN 19  CREATININE 1.10   No results found for this basename: AST, ALT, ALKPHOS, BILITOT, PROT, ALBUMIN,  in the last 72 hours No results found for this basename: LIPASE, AMYLASE,  in the last 72 hours  Recent Labs  04/29/13 0208 04/29/13 0213  WBC 5.2  --   NEUTROABS 2.5  --   HGB 11.9* 13.9  HCT 35.4* 41.0  MCV 73.9*  --   PLT 192  --    No results found for this basename: CKTOTAL, CKMB, CKMBINDEX, TROPONINI,  in the last 72 hours No results found for this basename: TSH, T4TOTAL, FREET3, T3FREE, THYROIDAB,  in the last 72 hours No results found for this basename: VITAMINB12, FOLATE, FERRITIN, TIBC, IRON, RETICCTPCT,  in the last 72 hours Lab Results  Component Value Date   HGBA1C 5.5 11/30/2006    The CrCl is unknown because both a height and weight (above a minimum accepted value) are required for this calculation. ABG    Component Value Date/Time   TCO2 28 04/29/2013 0213     No results found for this basename: DDIMER     Other results:  I have pearsonaly reviewed this: ECG REPORT  Rate: 126  Rhythm: a. fib ST&T Change: ST depressions in lateral leads  UA  proteinuria BNP 2162.0 (H)   Cultures: No results found for this basename: sdes, specrequest, cult, reptstatus       Radiological Exams on Admission: Dg Chest 2 View  04/29/2013   CLINICAL DATA:  Shortness of breath.  EXAM: CHEST  2 VIEW  COMPARISON:  None.  FINDINGS: The lungs are well-aerated. Vascular congestion is noted, with bilateral airspace opacification and small bilateral pleural effusions, compatible with pulmonary edema. No pneumothorax is seen.  The heart is mildly enlarged. No acute osseous abnormalities are seen.  IMPRESSION: Vascular congestion and mild cardiomegaly, with bilateral airspace opacification  and small bilateral pleural effusions, compatible with pulmonary edema.   Electronically Signed   By: Roanna Raider M.D.   On: 04/29/2013 02:46    Chart has been reviewed  Assessment/Plan 67 yo F w hx of HTN not controlled currently off her medications presents with new onset of CHF and A.fib w RVR now rate controlled.   Present on Admission:  . CHF, acute - new diagnosis, will obtain 2d echo, cycle CE, serial ECG, start on ACEi and beta blocker. Check TSH . HYPOKALEMIA - will replace, check magnesium . HTN (hypertension), malignant - currently on nitro gtt, will restart home meds, add metoprolol and ACEi will titrate nitro gtt . Atrial fibrillation with RVR - currently rate controlled. This is new diagnosis. No hx of CAD or DM. Will obtain  2D echo. Check TSH, cycle CE. Start on metoprolol, initiate lovenox full dose at this point until work up is complete. CHA2DS2VaSc score of 4.  Prophylaxis: Lovenox, Protonix  CODE STATUS:FULL CODE  Other plan as per orders.  I have spent a total of 55 min on this admission  Mack Alvidrez 04/29/2013, 4:35 AM

## 2013-04-29 NOTE — Progress Notes (Signed)
ANTICOAGULATION CONSULT NOTE - Initial Consult  Pharmacy Consult for Lovenox Indication: atrial fibrillation  Allergies  Allergen Reactions  . Metronidazole     REACTION: Rash, itching    Patient Measurements: Height: 5\' 5"  (165.1 cm) Weight: 162 lb 4.1 oz (73.6 kg) IBW/kg (Calculated) : 57  Vital Signs: Temp: 98.7 F (37.1 C) (11/08 0500) Temp src: Oral (11/08 0500) BP: 173/91 mmHg (11/08 0630) Pulse Rate: 65 (11/08 0630)  Labs:  Recent Labs  04/29/13 0208 04/29/13 0213  HGB 11.9* 13.9  HCT 35.4* 41.0  PLT 192  --   CREATININE  --  1.10    Estimated Creatinine Clearance: 51.2 ml/min (by C-G formula based on Cr of 1.1).   Medical History: Past Medical History  Diagnosis Date  . Hypertension   . Thyroid disease     Medications:  Prescriptions prior to admission  Medication Sig Dispense Refill  . amLODipine (NORVASC) 10 MG tablet Take 10 mg by mouth daily.      Marland Kitchen levothyroxine (SYNTHROID, LEVOTHROID) 100 MCG tablet Take 100 mcg by mouth daily before breakfast.       Scheduled:  . amLODipine  10 mg Oral Daily  . aspirin EC  81 mg Oral Daily  . enoxaparin (LOVENOX) injection  75 mg Subcutaneous Q12H  . furosemide  40 mg Oral BID  . [START ON 04/30/2013] influenza vac split quadrivalent PF  0.5 mL Intramuscular Tomorrow-1000  . levothyroxine  100 mcg Oral QAC breakfast  . metoprolol tartrate  12.5 mg Oral BID  . pantoprazole  40 mg Oral Q1200  . [START ON 04/30/2013] pneumococcal 23 valent vaccine  0.5 mL Intramuscular Tomorrow-1000  . potassium chloride  40 mEq Oral BID  . ramipril  5 mg Oral BID  . sodium chloride  3 mL Intravenous Q12H   Infusions:  . nitroGLYCERIN 5 mcg/min (04/29/13 0418)    Assessment: 66yo female c/o SOB x1wk, found w/ new-onset Aib/HF, to begin anticoagulation.  Goal of Therapy:  Anti-Xa level 0.6-1.2 units/ml 4hrs after LMWH dose given Monitor platelets by anticoagulation protocol: Yes   Plan:  Will begin Lovenox 75mg  SQ  Q12H and monitor CBC; f/u long-term anticoag plans  Vernard Gambles, PharmD, BCPS  04/29/2013,6:48 AM

## 2013-04-29 NOTE — ED Notes (Signed)
Sob for one week.  Audible wheezes

## 2013-04-29 NOTE — ED Provider Notes (Signed)
Pt with 1 week hx of gradual onst, gradually worsening SOB with a cough.  She presents with hypoxia, tachypnea and diffuse wheezing with rales at bases.  No peripheral edema.  Tachycardia in afib with RVR on exam which is confirmed on ECG.  She will need imaging, labs including trop and bnp incase this is cardiac wheeze with  New onset CHF - afib to be rate controlled with CCB - will avoid BB as she is wheezing.  She has no hx of COPD or RAD.    I have personally seen and interpreted chest x-ray which shows that she has bilateral pulmonary infiltrates consistent with pulmonary edema. Her blood work shows that her BNP is significantly elevated consistent with congestive heart failure.  The patient has severe hypertension requiring a nitroglycerin drip which was also helping with her congestive heart failure, she has been given Lasix and she has been placed on Cardizem drip for her atrial fibrillation with rapid response. She is slowly improving but still appears critically ill and will need to be in a step down unit tonight. I discussed her care with Dr. Adela Glimpse of the hospitalist service who will admit.  CRITICAL CARE Performed by: Vida Roller Total critical care time: 35 Critical care time was exclusive of separately billable procedures and treating other patients. Critical care was necessary to treat or prevent imminent or life-threatening deterioration. Critical care was time spent personally by me on the following activities: development of treatment plan with patient and/or surrogate as well as nursing, discussions with consultants, evaluation of patient's response to treatment, examination of patient, obtaining history from patient or surrogate, ordering and performing treatments and interventions, ordering and review of laboratory studies, ordering and review of radiographic studies, pulse oximetry and re-evaluation of patient's condition.   Medical screening  examination/treatment/procedure(s) were conducted as a shared visit with non-physician practitioner(s) and myself.  I personally evaluated the patient during the encounter.  Clinical Impression:   #1 hypertensive emergency #2 acute congestive heart failure #3 atrial fibrillation with rapid ventricular response      Vida Roller, MD 04/29/13 [phone]

## 2013-04-29 NOTE — ED Provider Notes (Signed)
CSN: [id number]     Arrival date & time 04/29/13  0150 History   First MD Initiated Contact with Patient 04/29/13 0152     Chief Complaint  Patient presents with  . Shortness of Breath   (Consider location/radiation/quality/duration/timing/severity/associated sxs/prior Treatment) HPI Comments: Patient reports 1 week of gradually worsening SOB  Has Hx HTN and hypothyroidism, but has not taken meds in a while  Denies fever, Hx COPD or asthma Has taken tussin occasionally without relief  Patient is a 67 y.o. female presenting with shortness of breath. The history is provided by the patient.  Shortness of Breath Severity:  Severe Onset quality:  Gradual Duration:  1 week Timing:  Constant Progression:  Worsening Chronicity:  New Relieved by:  None tried Worsened by:  Activity Ineffective treatments:  None tried Associated symptoms: wheezing   Associated symptoms: no chest pain, no cough, no diaphoresis, no fever, no PND and no vomiting   Risk factors comment:  Hx of HTN not taking meds    Past Medical History  Diagnosis Date  . Hypertension   . Thyroid disease    Past Surgical History  Procedure Laterality Date  . Partial hysterectomy     No family history on file. History  Substance Use Topics  . Smoking status: Former Games developer  . Smokeless tobacco: Not on file  . Alcohol Use: No   OB History   Grav Para Term Preterm Abortions TAB SAB Ect Mult Living                 Review of Systems  Constitutional: Negative for fever and diaphoresis.  HENT: Negative for rhinorrhea.   Respiratory: Positive for shortness of breath and wheezing. Negative for cough.   Cardiovascular: Negative for chest pain, leg swelling and PND.  Gastrointestinal: Negative for vomiting.  Musculoskeletal: Negative for myalgias.  Neurological: Negative for dizziness.  All other systems reviewed and are negative.    Allergies  Metronidazole  Home Medications   Current Outpatient Rx  Name   Route  Sig  Dispense  Refill  . amLODipine (NORVASC) 10 MG tablet   Oral   Take 10 mg by mouth daily.         Marland Kitchen levothyroxine (SYNTHROID, LEVOTHROID) 100 MCG tablet   Oral   Take 100 mcg by mouth daily before breakfast.          BP 185/134  Pulse 152  Resp 29  SpO2 98% Physical Exam  Nursing note and vitals reviewed. Constitutional: She is oriented to person, place, and time. She appears well-developed and well-nourished.  HENT:  Head: Normocephalic.  Eyes: Pupils are equal, round, and reactive to light.  Neck: Normal range of motion.  Cardiovascular: Regular rhythm.  Tachycardia present.   Pulmonary/Chest: Effort normal. No respiratory distress. She has wheezes. She exhibits no tenderness.  Abdominal: Soft. She exhibits no distension. There is no tenderness.  Musculoskeletal: Normal range of motion.  Neurological: She is alert and oriented to person, place, and time.  Skin: Skin is warm and dry. No rash noted.    ED Course  Procedures (including critical care time) Labs Review Labs Reviewed  CBC WITH DIFFERENTIAL - Abnormal; Notable for the following:    Hemoglobin 11.9 (*)    HCT 35.4 (*)    MCV 73.9 (*)    MCH 24.8 (*)    All other components within normal limits  PRO B NATRIURETIC PEPTIDE - Abnormal; Notable for the following:    Pro B Natriuretic peptide (  BNP) 2162.0 (*)    All other components within normal limits  POCT I-STAT, CHEM 8 - Abnormal; Notable for the following:    Potassium 3.2 (*)    Glucose, Bld 149 (*)    All other components within normal limits  URINALYSIS, ROUTINE W REFLEX MICROSCOPIC  POCT I-STAT TROPONIN I   Imaging Review Dg Chest 2 View  04/29/2013   CLINICAL DATA:  Shortness of breath.  EXAM: CHEST  2 VIEW  COMPARISON:  None.  FINDINGS: The lungs are well-aerated. Vascular congestion is noted, with bilateral airspace opacification and small bilateral pleural effusions, compatible with pulmonary edema. No pneumothorax is seen.  The  heart is mildly enlarged. No acute osseous abnormalities are seen.  IMPRESSION: Vascular congestion and mild cardiomegaly, with bilateral airspace opacification and small bilateral pleural effusions, compatible with pulmonary edema.   Electronically Signed   By: Roanna Raider M.D.   On: 04/29/2013 02:46    EKG Interpretation     Ventricular Rate:  144 PR Interval:    QRS Duration: 90 QT Interval:  319 QTC Calculation: 494 R Axis:   82 Text Interpretation:  Atrial fibrillation Ventricular premature complex Borderline right axis deviation Probable LVH with secondary repol abnrm ST depr, consider ischemia, inferior leads Borderline prolonged QT interval No old tracing to compare Abnormal ekg            MDM  No diagnosis found. Lesions.  Labs, x-ray, EKG, reviewed.  There is a patient with new-onset to she's been started on nitroglycerin, Cardizem, and Lasix limit     Arman Filter, NP 04/29/13 2325

## 2013-04-29 NOTE — ED Notes (Signed)
Pharmacy notified for cardizem 

## 2013-04-29 NOTE — ED Notes (Signed)
Stats upon arrival 81% RA.

## 2013-04-29 NOTE — Progress Notes (Signed)
Echocardiogram 2D Echocardiogram has been performed.  Dorothey Baseman 04/29/2013, 8:04 AM

## 2013-04-29 NOTE — Progress Notes (Signed)
[facility] Progress Note [place] TEAM 1 - Stepdown/ICU TEAM   Sharlize Hoar ZOX:[id number] DOB: 03-11-1946 DOA: 04/29/2013 PCP: No PCP Per Patient  Brief narrative: 67 y/o wih hypothyroidism and HTN who saw dr Roseanne Reno as an outpt 1 month ago and was given prescriptions for Norvasc and Lasix. She did not return and ran out of medications over 1 wk ago.  Now presents with 1 week hx of worsening shortness of breath and trouble laying down flat. Denies any chest pain or headache. She has been out of her meds for the past 1 month. Denies significant leg swelling but she has had cough when she lays down. She has never been seen by cardiology. She presented to ER with a.fib w RVR HR in 140's currently HR down to 72 on diltiazem gtt her blood pressure was noted to be elevated in 180's.  After receiving lasix and being started on nitro gtt patient has diuresed and is feeling better.   Assessment/Plan: Principal Problem:   Acute respiratory failure/  Pulmonary edema - cont to diurese and control rate - EF WNL therefore cause is likely A-fib with RVR  Active Problems:   HYPOKALEMIA - aggressive replacement PO and IV    Atrial fibrillation with RVR - Cardizem infusion - increase Metoprolol to 50 BID    HTN (hypertension), malignant - currently on Nitro infusion and CArdizem infusions -increase ACE dose and Metoprolol- cont Norvasc  Non-compliance with medical follow up-  Pt has insurance, saw Dr Roseanne Reno at [facility] 1 mo ago but never returned   Code Status: full Family Communication: none Disposition Plan: follow in sDU  Consultants: cardio  Procedures: none  Antibiotics: none  DVT prophylaxis: Lovenox  HPI/Subjective: Pt much less orthopneic. No palpitations or chest pain.    Objective: Blood pressure 151/91, pulse 61, temperature 97.6 F (36.4 C), temperature source Oral, resp. rate 25, height 5\' 5"  (1.651 m), weight 73.6 kg (162 lb 4.1 oz),  SpO2 98.00%.  Intake/Output Summary (Last 24 hours) at 04/29/13 1423 Last data filed at 04/29/13 0700  Gross per 24 hour  Intake  58.85 ml  Output   1825 ml  Net -1766.15 ml     Exam: General: No acute respiratory distress Lungs: crackles at bases Cardiovascular: Irregular rate and rhythm without murmur gallop or rub normal S1 and S2 Abdomen: Nontender, nondistended, soft, bowel sounds positive, no rebound, no ascites, no appreciable mass Extremities: No significant cyanosis, clubbing, or edema bilateral lower extremities  Data Reviewed: Basic Metabolic Panel:  Recent Labs Lab 04/29/13 0213 04/29/13 0218 04/29/13 1140  NA 141  --  137  K 3.2*  --  2.7*  CL 100  --  98  CO2  --   --  28  GLUCOSE 149*  --  110*  BUN 19  --  14  CREATININE 1.10  --  0.75  CALCIUM  --   --  9.0  MG  --  1.7  --    Liver Function Tests: No results found for this basename: AST, ALT, ALKPHOS, BILITOT, PROT, ALBUMIN,  in the last 168 hours No results found for this basename: LIPASE, AMYLASE,  in the last 168 hours No results found for this basename: AMMONIA,  in the last 168 hours CBC:  Recent Labs Lab 04/29/13 0208 04/29/13 0213 04/29/13 1140  WBC 5.2  --  5.5  NEUTROABS 2.5  --  3.5  HGB 11.9* 13.9 11.1*  HCT 35.4* 41.0 33.0*  MCV  73.9*  --  73.5*  PLT 192  --  163   Cardiac Enzymes: No results found for this basename: CKTOTAL, CKMB, CKMBINDEX, TROPONINI,  in the last 168 hours BNP (last 3 results)  Recent Labs  04/29/13 0218  PROBNP 2162.0*   CBG: No results found for this basename: GLUCAP,  in the last 168 hours  Recent Results (from the past 240 hour(s))  MRSA PCR SCREENING     Status: None   Collection Time    04/29/13  5:40 AM      Result Value Range Status   MRSA by PCR NEGATIVE  NEGATIVE Final   Comment:            The GeneXpert MRSA Assay (FDA     approved for NASAL specimens     only), is one component of a     comprehensive MRSA colonization      surveillance program. It is not     intended to diagnose MRSA     infection nor to guide or     monitor treatment for     MRSA infections.     Studies:  Recent x-ray studies have been reviewed in detail by the Attending Physician  Scheduled Meds:  Scheduled Meds: . amLODipine  10 mg Oral Daily  . aspirin EC  81 mg Oral Daily  . enoxaparin (LOVENOX) injection  75 mg Subcutaneous Q12H  . furosemide  40 mg Oral BID  . [START ON 04/30/2013] influenza vac split quadrivalent PF  0.5 mL Intramuscular Tomorrow-1000  . levothyroxine  100 mcg Oral QAC breakfast  . metoprolol tartrate  25 mg Oral BID  . pantoprazole  40 mg Oral Q1200  . [START ON 04/30/2013] pneumococcal 23 valent vaccine  0.5 mL Intramuscular Tomorrow-1000  . potassium chloride  10 mEq Intravenous Q1 Hr x 2  . potassium chloride  40 mEq Oral BID  . potassium chloride  40 mEq Oral Once  . potassium chloride  40 mEq Oral Once  . ramipril  10 mg Oral BID  . sodium chloride  3 mL Intravenous Q12H   Continuous Infusions: . diltiazem (CARDIZEM) infusion 5 mg/hr (04/29/13 0830)  . nitroGLYCERIN 15 mcg/min (04/29/13 1326)    Time spent on care of this patient: 35 min   Janitza Revuelta, MD  [facility] Office  [phone] Pager - Text Page per Amion as per below:  On-Call/Text Page:      [email or web address]      password [email or web address]  If 7PM-7AM, please contact night-coverage [email or web address] Password [email or web address] 04/29/2013, 2:23 PM   LOS: 0 days

## 2013-04-29 NOTE — ED Notes (Signed)
Pt able to get out of bed for toileting without difficulty or sob.

## 2013-04-29 NOTE — Progress Notes (Signed)
CRITICAL VALUE ALERT  Critical value received:  Results for Joanna Hood, Joanna Hood (MRN [id number]) as of 04/29/2013 15:37  Ref. Range 04/29/2013 13:10  CK, MB Latest Range: 0.3-4.0 ng/mL 6.6 [facility])    Date of notification:  04/29/2013   Time of notification:  3:39 PM   Critical value read back:yes  Nurse who received alert:  Desiree Lucy   MD notified (1st page):  Annie Paras, pa  Time of first page:  3:40 PM   MD notified (2nd page):  Time of second page:  Responding MD:  Lossie Faes  Time MD responded:  3:40 PM

## 2013-04-30 LAB — BASIC METABOLIC PANEL
BUN: 22 mg/dL (ref 6–23)
CO2: 27 mEq/L (ref 19–32)
Calcium: 8.8 mg/dL (ref 8.4–10.5)
Creatinine, Ser: 0.99 mg/dL (ref 0.50–1.10)
GFR calc non Af Amer: 59 mL/min — ABNORMAL LOW (ref >90)
Glucose, Bld: 100 mg/dL — ABNORMAL HIGH (ref 70–99)
Sodium: 136 mEq/L (ref 135–145)

## 2013-04-30 LAB — CBC
HCT: 30.4 % — ABNORMAL LOW (ref 36.0–46.0)
MCH: 24.9 pg — ABNORMAL LOW (ref 26.0–34.0)
MCHC: 33.6 g/dL (ref 30.0–36.0)
MCV: 74.1 fL — ABNORMAL LOW (ref 78.0–100.0)
RDW: 14.7 % (ref 11.5–15.5)

## 2013-04-30 LAB — HEPARIN LEVEL (UNFRACTIONATED): Heparin Unfractionated: 0.82 IU/mL — ABNORMAL HIGH (ref 0.30–0.70)

## 2013-04-30 MED ORDER — POTASSIUM CHLORIDE CRYS ER 20 MEQ PO TBCR: 40.0000 meq | EXTENDED_RELEASE_TABLET | Freq: Two times a day (BID) | ORAL | Status: DC

## 2013-04-30 MED ORDER — DILTIAZEM HCL ER COATED BEADS 120 MG PO TB24
240.0000 mg | ORAL_TABLET | Freq: Every day | ORAL | Status: DC
  Administered 2013-04-30: 240 mg via ORAL
  Filled 2013-04-30 (×2): qty 2

## 2013-04-30 MED ORDER — MAGNESIUM SULFATE 40 MG/ML IJ SOLN
2.0000 g | Freq: Once | INTRAMUSCULAR | Status: AC
  Administered 2013-04-30: 2 g via INTRAVENOUS
  Filled 2013-04-30: qty 50

## 2013-04-30 MED ORDER — SODIUM CHLORIDE 0.9 % IJ SOLN: 3.0000 mL | INTRAMUSCULAR | Status: DC | PRN

## 2013-04-30 MED ORDER — HEPARIN (PORCINE) IN NACL 100-0.45 UNIT/ML-% IJ SOLN
800.0000 [IU]/h | INTRAMUSCULAR | Status: DC
  Administered 2013-05-01: 800 [IU]/h via INTRAVENOUS
  Filled 2013-04-30: qty 250

## 2013-04-30 MED ORDER — SODIUM CHLORIDE 0.9 % IJ SOLN
3.0000 mL | Freq: Two times a day (BID) | INTRAMUSCULAR | Status: DC
  Administered 2013-05-01: 3 mL via INTRAVENOUS

## 2013-04-30 MED ORDER — POTASSIUM CHLORIDE CRYS ER 20 MEQ PO TBCR
40.0000 meq | EXTENDED_RELEASE_TABLET | Freq: Two times a day (BID) | ORAL | Status: DC
  Administered 2013-04-30: 40 meq via ORAL

## 2013-04-30 MED ORDER — POTASSIUM CHLORIDE CRYS ER 20 MEQ PO TBCR
40.0000 meq | EXTENDED_RELEASE_TABLET | Freq: Every day | ORAL | Status: DC
  Administered 2013-05-01 – 2013-05-03 (×3): 40 meq via ORAL
  Filled 2013-04-30 (×3): qty 2

## 2013-04-30 MED ORDER — MAGNESIUM OXIDE 400 (241.3 MG) MG PO TABS
400.0000 mg | ORAL_TABLET | Freq: Two times a day (BID) | ORAL | Status: DC
  Administered 2013-04-30 – 2013-05-03 (×7): 400 mg via ORAL
  Filled 2013-04-30 (×8): qty 1

## 2013-04-30 MED ORDER — DILTIAZEM HCL ER COATED BEADS 180 MG PO CP24: 180.0000 mg | ORAL_CAPSULE | Freq: Every day | ORAL | Status: DC

## 2013-04-30 MED ORDER — ASPIRIN 81 MG PO CHEW
81.0000 mg | CHEWABLE_TABLET | ORAL | Status: AC
  Administered 2013-05-01: 81 mg via ORAL
  Filled 2013-04-30: qty 1

## 2013-04-30 MED ORDER — DIAZEPAM 5 MG PO TABS: 5.0000 mg | ORAL_TABLET | ORAL | Status: DC

## 2013-04-30 MED ORDER — POTASSIUM CHLORIDE CRYS ER 20 MEQ PO TBCR
40.0000 meq | EXTENDED_RELEASE_TABLET | Freq: Once | ORAL | Status: AC
  Administered 2013-04-30: 40 meq via ORAL
  Filled 2013-04-30: qty 2

## 2013-04-30 MED ORDER — SODIUM CHLORIDE 0.9 % IV SOLN: 250.0000 mL | INTRAVENOUS | Status: DC | PRN

## 2013-04-30 MED ORDER — SODIUM CHLORIDE 0.9 % IV SOLN
1.0000 mL/kg/h | INTRAVENOUS | Status: DC
  Administered 2013-05-01: 1 mL/kg/h via INTRAVENOUS

## 2013-04-30 MED ORDER — HEPARIN (PORCINE) IN NACL 100-0.45 UNIT/ML-% IJ SOLN
1000.0000 [IU]/h | INTRAMUSCULAR | Status: DC
  Administered 2013-04-30: 1000 [IU]/h via INTRAVENOUS
  Filled 2013-04-30: qty 250

## 2013-04-30 MED ORDER — LEVOTHYROXINE SODIUM 125 MCG PO TABS
125.0000 ug | ORAL_TABLET | Freq: Every day | ORAL | Status: DC
  Administered 2013-05-01 – 2013-05-03 (×3): 125 ug via ORAL
  Filled 2013-04-30 (×4): qty 1

## 2013-04-30 MED ORDER — ASPIRIN EC 81 MG PO TBEC
81.0000 mg | DELAYED_RELEASE_TABLET | Freq: Every day | ORAL | Status: DC
  Administered 2013-05-02 – 2013-05-03 (×2): 81 mg via ORAL
  Filled 2013-04-30 (×2): qty 1

## 2013-04-30 NOTE — Progress Notes (Signed)
ANTICOAGULATION CONSULT NOTE - Follow Up Consult  Pharmacy Consult for Lovenox-->Heparin Indication: atrial fibrillation  Allergies  Allergen Reactions  . Metronidazole     REACTION: Rash, itching    Patient Measurements: Height: 5\' 5"  (165.1 cm) Weight: 162 lb 4.1 oz (73.6 kg) IBW/kg (Calculated) : 57 Heparin Dosing Weight: 72kg  Vital Signs: Temp: 98.5 F (36.9 C) (11/09 0327) Temp src: Oral (11/09 0327) BP: 175/85 mmHg (11/09 0814)  Labs:  Recent Labs  04/29/13 0208 04/29/13 0213 04/29/13 1140 04/29/13 1310 04/29/13 1900 04/30/13 0200  HGB 11.9* 13.9 11.1*  --   --  10.2*  HCT 35.4* 41.0 33.0*  --   --  30.4*  PLT 192  --  163  --   --  153  CREATININE  --  1.10 0.75  --   --  0.99  CKTOTAL  --   --   --  229*  --   --   CKMB  --   --   --  6.6*  --   --   TROPONINI  --   --   --  <0.30 <0.30 <0.30    Estimated Creatinine Clearance: 56.9 ml/min (by C-G formula based on Cr of 0.99).  Assessment: 66yom initially started on therapeutic lovenox for new afib now to switch to IV heparin with plans for cath tomorrow 11/10. Last lovenox dose was 11/8 @ 2125. Renal function wnl. Hgb 10.2, platelets 153.  Goal of Therapy:  Heparin level 0.3-0.7 units/ml Monitor platelets by anticoagulation protocol: Yes   Plan:  1) D/C lovenox 2) Start heparin at 1000 units/hr with NO bolus 3) 6 hour heparin level 4) Daily heparin level and CBC  Fredrik Rigger 04/30/2013,8:58 AM

## 2013-04-30 NOTE — Progress Notes (Signed)
Subjective: Feels better; HR now in 80's  Objective:   Vital Signs in the last 24 hours: Temp:  [97.4 F (36.3 C)-98.8 F (37.1 C)] 98.5 F (36.9 C) (11/09 0327) Pulse Rate:  [61-64] 64 (11/08 1638) Resp:  [22-25] 22 (11/08 1638) BP: (127-151)/(74-91) 127/74 mmHg (11/08 1638) SpO2:  [95 %-100 %] 95 % (11/09 0327)  Intake/Output from previous day: 11/08 0701 - 11/09 0700 In: 416.8 [I.V.:216.8; IV Piggyback:200] Out: 1300 [Urine:1300]  Medications:  . amLODipine  10 mg Oral Daily  . aspirin EC  81 mg Oral Daily  . enoxaparin (LOVENOX) injection  75 mg Subcutaneous Q12H  . furosemide  40 mg Oral BID  . influenza vac split quadrivalent PF  0.5 mL Intramuscular Tomorrow-1000  . levothyroxine  100 mcg Oral QAC breakfast  . metoprolol tartrate  25 mg Oral BID  . pantoprazole  40 mg Oral Q1200  . pneumococcal 23 valent vaccine  0.5 mL Intramuscular Tomorrow-1000  . potassium chloride  40 mEq Oral BID  . ramipril  10 mg Oral BID  . sodium chloride  3 mL Intravenous Q12H    . diltiazem (CARDIZEM) infusion 5 mg/hr (04/29/13 1955)  . nitroGLYCERIN 15 mcg/min (04/29/13 1600)   Physical Exam:   General appearance: alert, cooperative and no distress Neck: no adenopathy, no JVD, supple, symmetrical, trachea midline and thyroid not enlarged, symmetric, no tenderness/mass/nodules Lungs: clear to auscultation bilaterally Heart: regular rate and rhythm and 1/6 sem Abdomen: soft, non-tender; bowel sounds normal; no masses,  no organomegaly Extremities: no edema, redness or tenderness in the calves or thighs Pulses: 2+ and symmetric Skin: Skin color, texture, turgor normal. No rashes or lesions   Rate: 88  Rhythm: sinus with PVC's   Lab Results:    Recent Labs  04/29/13 1140 04/30/13 0200  NA 137 136  K 2.7* 3.1*  CL 98 99  CO2 28 27  GLUCOSE 110* 100*  BUN 14 22  CREATININE 0.75 0.99    Recent Labs  04/29/13 1900 04/30/13 0200  TROPONINI <0.30 <0.30   Hepatic  Function Panel No results found for this basename: PROT, ALBUMIN, AST, ALT, ALKPHOS, BILITOT, BILIDIR, IBILI,  in the last 72 hours No results found for this basename: INR,  in the last 72 hours BNP (last 3 results)  Recent Labs  04/29/13 0218  PROBNP 2162.0*    Lipid Panel     Component Value Date/Time   CHOL 184 12/03/2009 2304   TRIG 79 12/03/2009 2304   HDL 59 12/03/2009 2304   CHOLHDL 3.1 Ratio 12/03/2009 2304   VLDL 16 12/03/2009 2304   LDLCALC 109* 12/03/2009 2304      Imaging:  Dg Chest 2 View  04/29/2013   CLINICAL DATA:  Shortness of breath.  EXAM: CHEST  2 VIEW  COMPARISON:  None.  FINDINGS: The lungs are well-aerated. Vascular congestion is noted, with bilateral airspace opacification and small bilateral pleural effusions, compatible with pulmonary edema. No pneumothorax is seen.  The heart is mildly enlarged. No acute osseous abnormalities are seen.  IMPRESSION: Vascular congestion and mild cardiomegaly, with bilateral airspace opacification and small bilateral pleural effusions, compatible with pulmonary edema.   Electronically Signed   By: Roanna Raider M.D.   On: 04/29/2013 02:46      Assessment/Plan:   Principal Problem:   Acute respiratory failure Active Problems:   HYPOKALEMIA   Pulmonary edema   Atrial fibrillation with RVR   Hypokalemia   HTN (hypertension), malignant  Feels  better. ECG not yet done for today. However, with ECG changes recommend cath prior to initiation of oral anticoagulation. Will dc amlodipine, start oral cardizem at 240 mg daily and 1 hr after dose then dc iv cardizem. Will change lovenox to heparin and plan cardiac cath tomorrow. K replete with 40 meq x2 today 4 hrs apart.    Lennette Bihari, MD, [facility] 04/30/2013, 8:25 AM

## 2013-04-30 NOTE — Evaluation (Signed)
Physical Therapy Evaluation Patient Details Name: Joanna Hood MRN: [id number] DOB: 08-Mar-1946 Today's Date: 04/30/2013 Time: 2595-6387 PT Time Calculation (min): 11 min  PT Assessment / Plan / Recommendation History of Present Illness   67 year old female with a past medical history of Hypertension and Thyroid disease, presented with 1 week hx of worsening shortness of breath and trouble laying down flat.  Patient with CHF and A-fib with RVR  Clinical Impression  Patient is independent with mobility and gait.  Good balance.  No acute PT needs - PT will sign off.  Encouraged patient to ambulate with nursing in hallway 2-3x/day.    PT Assessment  Patent does not need any further PT services    Follow Up Recommendations  No PT follow up;Supervision - Intermittent    Does the patient have the potential to tolerate intense rehabilitation      Barriers to Discharge        Equipment Recommendations  None recommended by PT    Recommendations for Other Services     Frequency      Precautions / Restrictions Precautions Precautions: None Restrictions Weight Bearing Restrictions: No   Pertinent Vitals/Pain       Mobility  Bed Mobility Bed Mobility: Not assessed (Patient in chair as PT entered room) Transfers Transfers: Sit to Stand;Stand to Sit Sit to Stand: 5: Supervision;From chair/3-in-1 Stand to Sit: 5: Supervision;To chair/3-in-1 Ambulation/Gait Ambulation/Gait Assistance: 5: Supervision Ambulation Distance (Feet): 200 Feet Assistive device: None Ambulation/Gait Assistance Details: Patient with good gait pattern, balance and speed.  No loss of balance noted. Gait Pattern: Within Functional Limits Gait velocity: [facility]       PT Goals(Current goals can be found in the care plan section)  N/A  Visit Information  Last PT Received On: 04/30/13 Assistance Needed: +1 History of Present Illness:  67 year old female with a past medical history of Hypertension and  Thyroid disease, presented with 1 week hx of worsening shortness of breath and trouble laying down flat.  Patient with CHF and A-fib with RVR       Prior Functioning  Home Living Family/patient expects to be discharged to:: Private residence Living Arrangements: Other relatives (Granddaughter (works)) Available Help at Discharge: Family;Available PRN/intermittently Type of Home: House Home Access: Stairs to enter [employer] of Steps: 3 Entrance Stairs-Rails: None Home Layout: One level Home Equipment: None Prior Function Level of Independence: Independent Communication Communication: No difficulties    Cognition  Cognition Arousal/Alertness: Awake/alert Behavior During Therapy: WFL for tasks assessed/performed Overall Cognitive Status: Within Functional Limits for tasks assessed    Extremity/Trunk Assessment Upper Extremity Assessment Upper Extremity Assessment: Overall WFL for tasks assessed Lower Extremity Assessment Lower Extremity Assessment: Overall WFL for tasks assessed Cervical / Trunk Assessment Cervical / Trunk Assessment: Normal   Balance Balance Balance Assessed: Yes High Level Balance High Level Balance Activites: Turns;Sudden stops;Head turns High Level Balance Comments: No loss of balance with high level balance activities  End of Session PT - End of Session Equipment Utilized During Treatment: Gait belt Activity Tolerance: Patient tolerated treatment well Patient left: in chair;with call bell/phone within reach Nurse Communication: Mobility status (Encouraged patient to ambulate with nursing 2-3 x/day)  GP     Vena Austria 04/30/2013, 10:50 AM Durenda Hurt. Renaldo Fiddler, [facility] Pager [phone]

## 2013-04-30 NOTE — ED Provider Notes (Signed)
Medical screening examination/treatment/procedure(s) were conducted as a shared visit with non-physician practitioner(s) and myself.  I personally evaluated the patient during the encounter  Critical Care Provided - see separate documentation  Please see my separate respective documentation pertaining to this patient encounter   Vida Roller, MD 04/30/13 [phone]

## 2013-04-30 NOTE — Progress Notes (Addendum)
[facility] Progress Note Joanna Hood TEAM 1 - Stepdown/ICU TEAM   Joanna Hood ZOX:[id number] DOB: 1945/08/11 DOA: 04/29/2013 PCP: No PCP Per Patient  Brief narrative: 67 y/o wih hypothyroidism and HTN who saw dr Joanna Hood as an outpt 1 month ago and was given prescriptions for Norvasc and Lasix. She did not return and ran out of medications over 1 wk ago.  Now presents with 1 week hx of worsening shortness of breath and trouble laying down flat. Denies any chest pain or headache. She has been out of her meds for the past 1 month. Denies significant leg swelling but she has had cough when she lays down. She has never been seen by cardiology. She presented to ER with a.fib w RVR HR in 140's currently HR down to 72 on diltiazem gtt her blood pressure was noted to be elevated in 180's.  After receiving lasix and being started on nitro gtt patient has diuresed and is feeling better.   Assessment/Plan: Principal Problem:   Acute respiratory failure/  Pulmonary edema - cont to control rate- - will d/c Lasix now as she has diureses sufficiently  - EF WNL therefore cause is likely A-fib with RVR  Abnormal EKG inf/ lat T wave inversions - Cath tomorrow  - appreciate cardio assistance     HYPOKALEMIA - aggressive replacement PO and IV  Hypomag -repalce PO and IV and recheck    Atrial fibrillation with RVR - Cardizem infusion  being switched to oral today by Cardio - increase Metoprolol to 50 BID  Significantly elevated TSH - cont Synthroid - pt was not taking it either - f/u on Free T4    HTN (hypertension), malignant -increased ACE dose and Metoprolol- d/c Norvasc- cont CArdizem  Non-compliance with medical follow up-  Pt has insurance, saw Dr Joanna Hood at [facility] 1 mo ago but never returned - tells me that she understands the importance of follow up now   Code Status: full Family Communication: none Disposition Plan: follow in  sDU  Consultants: cardio  Procedures: none  Antibiotics: none  DVT prophylaxis: Lovenox  HPI/Subjective: Pt sitting up in a chair- no dyspnea at rest/ laying flat or with exertion.    Objective: Blood pressure 175/85, pulse 64, temperature 97.9 F (36.6 C), temperature source Oral, resp. rate 26, height 5\' 5"  (1.651 m), weight 73.6 kg (162 lb 4.1 oz), SpO2 95.00%.  Intake/Output Summary (Last 24 hours) at 04/30/13 1543 Last data filed at 04/30/13 1300  Gross per 24 hour  Intake    433 ml  Output    900 ml  Net   -467 ml     Exam: General: No acute respiratory distress Lungs: crackles at bases Cardiovascular: Irregular rate and rhythm without murmur gallop or rub normal S1 and S2 Abdomen: Nontender, nondistended, soft, bowel sounds positive, no rebound, no ascites, no appreciable mass Extremities: No significant cyanosis, clubbing, or edema bilateral lower extremities  Data Reviewed: Basic Metabolic Panel:  Recent Labs Lab 04/29/13 0213 04/29/13 0218 04/29/13 1140 04/29/13 1310 04/30/13 0200  NA 141  --  137  --  136  K 3.2*  --  2.7*  --  3.1*  CL 100  --  98  --  99  CO2  --   --  28  --  27  GLUCOSE 149*  --  110*  --  100*  BUN 19  --  14  --  22  CREATININE 1.10  --  0.75  --  0.99  CALCIUM  --   --  9.0  --  8.8  MG  --  1.7  --  1.4* 1.4*   Liver Function Tests: No results found for this basename: AST, ALT, ALKPHOS, BILITOT, PROT, ALBUMIN,  in the last 168 hours No results found for this basename: LIPASE, AMYLASE,  in the last 168 hours No results found for this basename: AMMONIA,  in the last 168 hours CBC:  Recent Labs Lab 04/29/13 0208 04/29/13 0213 04/29/13 1140 04/30/13 0200  WBC 5.2  --  5.5 5.5  NEUTROABS 2.5  --  3.5  --   HGB 11.9* 13.9 11.1* 10.2*  HCT 35.4* 41.0 33.0* 30.4*  MCV 73.9*  --  73.5* 74.1*  PLT 192  --  163 153   Cardiac Enzymes:  Recent Labs Lab 04/29/13 1310 04/29/13 1900 04/30/13 0200  CKTOTAL 229*  --    --   CKMB 6.6*  --   --   TROPONINI <0.30 <0.30 <0.30   BNP (last 3 results)  Recent Labs  04/29/13 0218  PROBNP 2162.0*   CBG: No results found for this basename: GLUCAP,  in the last 168 hours  Recent Results (from the past 240 hour(s))  MRSA PCR SCREENING     Status: None   Collection Time    04/29/13  5:40 AM      Result Value Range Status   MRSA by PCR NEGATIVE  NEGATIVE Final   Comment:            The GeneXpert MRSA Assay (FDA     approved for NASAL specimens     only), is one component of a     comprehensive MRSA colonization     surveillance program. It is not     intended to diagnose MRSA     infection nor to guide or     monitor treatment for     MRSA infections.     Studies:  Recent x-ray studies have been reviewed in detail by the Attending Physician  Scheduled Meds:  Scheduled Meds: . aspirin EC  81 mg Oral Daily  . diltiazem  240 mg Oral Daily  . levothyroxine  100 mcg Oral QAC breakfast  . magnesium oxide  400 mg Oral BID  . metoprolol tartrate  25 mg Oral BID  . pantoprazole  40 mg Oral Q1200  . [START ON 05/01/2013] potassium chloride  40 mEq Oral Daily  . ramipril  10 mg Oral BID  . sodium chloride  3 mL Intravenous Q12H   Continuous Infusions: . heparin 1,000 Units/hr (04/30/13 1300)    Time spent on care of this patient: 35 min   Joanna Uffelman, MD  [facility] Office  [phone] Pager - Text Page per Joanna Hood as per below:  On-Call/Text Page:      [email or web address]      password [email or web address]  If 7PM-7AM, please contact night-coverage [email or web address] Password [email or web address] 04/30/2013, 3:43 PM   LOS: 1 day

## 2013-04-30 NOTE — Progress Notes (Signed)
ANTICOAGULATION CONSULT NOTE - Follow Up Consult  Pharmacy Consult for Heparin Indication: atrial fibrillation  Allergies  Allergen Reactions  . Metronidazole     REACTION: Rash, itching    Patient Measurements: Height: 5\' 5"  (165.1 cm) Weight: 162 lb 4.1 oz (73.6 kg) IBW/kg (Calculated) : 57 Heparin Dosing Weight: 72kg  Vital Signs: Temp: 96.4 F (35.8 C) (11/09 1638) Temp src: Oral (11/09 1638) BP: 146/82 mmHg (11/09 1638)  Labs:  Recent Labs  04/29/13 0208 04/29/13 0213 04/29/13 1140 04/29/13 1310 04/29/13 1900 04/30/13 0200 04/30/13 1700  HGB 11.9* 13.9 11.1*  --   --  10.2*  --   HCT 35.4* 41.0 33.0*  --   --  30.4*  --   PLT 192  --  163  --   --  153  --   HEPARINUNFRC  --   --   --   --   --   --  0.82*  CREATININE  --  1.10 0.75  --   --  0.99  --   CKTOTAL  --   --   --  229*  --   --   --   CKMB  --   --   --  6.6*  --   --   --   TROPONINI  --   --   --  <0.30 <0.30 <0.30  --     Estimated Creatinine Clearance: 56.9 ml/min (by C-G formula based on Cr of 0.99).  Assessment: 66yom initially started on therapeutic lovenox for new afib now to switch to IV heparin with plans for cath tomorrow 11/10. Last lovenox dose was 11/8 @ 2125. Renal function wnl. Hgb 10.2, platelets 153.  Initial heparin level 0.82, slightly supratherapeutic.  No bleeding or complications noted per chart notes.  May still be a little residual anti-Xa effect from Lovenox dose last night.  Goal of Therapy:  Heparin level 0.3-0.7 units/ml Monitor platelets by anticoagulation protocol: Yes   Plan:  1) Decrease IV heparin to 900 units/hr. 2) Recheck heparin level in 6 hrs. 3) Continue daily heparin level and CBC.  Tad Moore, BCPS  Clinical Pharmacist Pager [phone]  04/30/2013 5:53 PM

## 2013-04-30 NOTE — Progress Notes (Signed)
   CARE MANAGEMENT NOTE 04/30/2013  Patient:  SOLENNE, MANWARREN   Account Number:  [id number]  Date Initiated:  04/30/2013  Documentation initiated by:  [facility]  Subjective/Objective Assessment:   adm: Chest pain , SOB     Action/Plan:   discharge planning   Anticipated DC Date:  05/02/2013   Anticipated DC Plan:  HOME W HOME HEALTH SERVICES      DC Planning Services  CM consult      Choice offered to / List presented to:          [facility] arranged  HH-1 RN  HH-10 DISEASE MANAGEMENT      HH agency  [facility]   Status of service:  In process, will continue to follow Medicare Important Message given?   (If response is "NO", the following Medicare IM given date fields will be blank) Date Medicare IM given:   Date Additional Medicare IM given:    Discharge Disposition:    Per UR Regulation:    If discussed at Long Length of Stay Meetings, dates discussed:    Comments:  04/30/13 09:00 CM met pt in room to give pt PCP Resource List.  Cm also offered choice for HHRN.  Pt chose [facility]. Referral faxed to [facility]  possible PT/OT add on as determined by PT/OT eval.  Address and contact numbers verified.  Will continue to monitor for discharge needs. Freddy Jaksch, BSN, CM [phone].

## 2013-04-30 NOTE — Progress Notes (Signed)
Physical Therapy Discharge Patient Details Name: Joanna Hood MRN: [id number] DOB: [birth year] Today's Date: 04/30/2013 Time: 2130-8657 PT Time Calculation (min): 11 min  Patient discharged from PT services secondary to goals met and no further PT needs identified.  Please see latest therapy progress note for current level of functioning and progress toward goals.    Progress and discharge plan discussed with patient and/or caregiver: Patient/Caregiver agrees with plan  GP     Vena Austria 04/30/2013, 10:51 AM

## 2013-05-01 ENCOUNTER — Encounter (HOSPITAL_COMMUNITY)
Admission: EM | Disposition: A | Discharge status: Home / Self Care | Payer: Self-pay | Source: Home / Self Care | Attending: Internal Medicine

## 2013-05-01 DIAGNOSIS — E039 Hypothyroidism, unspecified: Secondary | ICD-10-CM

## 2013-05-01 DIAGNOSIS — I249 Acute ischemic heart disease, unspecified: Secondary | ICD-10-CM | POA: Diagnosis present

## 2013-05-01 DIAGNOSIS — I2510 Atherosclerotic heart disease of native coronary artery without angina pectoris: Secondary | ICD-10-CM

## 2013-05-01 HISTORY — PX: LEFT HEART CATHETERIZATION WITH CORONARY ANGIOGRAM: SHX5451

## 2013-05-01 LAB — CBC
HCT: 33.9 % — ABNORMAL LOW (ref 36.0–46.0)
Hemoglobin: 11.2 g/dL — ABNORMAL LOW (ref 12.0–15.0)
MCH: 24.5 pg — ABNORMAL LOW (ref 26.0–34.0)
MCH: 24.7 pg — ABNORMAL LOW (ref 26.0–34.0)
MCHC: 33 g/dL (ref 30.0–36.0)
MCHC: 33.1 g/dL (ref 30.0–36.0)
MCV: 74.6 fL — ABNORMAL LOW (ref 78.0–100.0)
Platelets: 176 10*3/uL (ref 150–400)
RDW: 14.6 % (ref 11.5–15.5)
WBC: 5.5 10*3/uL (ref 4.0–10.5)

## 2013-05-01 LAB — BASIC METABOLIC PANEL
BUN: 21 mg/dL (ref 6–23)
CO2: 22 mEq/L (ref 19–32)
Chloride: 101 mEq/L (ref 96–112)
GFR calc Af Amer: 65 mL/min — ABNORMAL LOW (ref >90)
Glucose, Bld: 104 mg/dL — ABNORMAL HIGH (ref 70–99)
Potassium: 4.4 mEq/L (ref 3.5–5.1)

## 2013-05-01 LAB — HEPARIN LEVEL (UNFRACTIONATED): Heparin Unfractionated: 0.71 IU/mL — ABNORMAL HIGH (ref 0.30–0.70)

## 2013-05-01 LAB — CREATININE, SERUM: Creatinine, Ser: 0.9 mg/dL (ref 0.50–1.10)

## 2013-05-01 LAB — MAGNESIUM: Magnesium: 2.2 mg/dL (ref 1.5–2.5)

## 2013-05-01 LAB — PROTIME-INR: INR: 1 (ref 0.00–1.49)

## 2013-05-01 SURGERY — LEFT HEART CATHETERIZATION WITH CORONARY ANGIOGRAM
Anesthesia: LOCAL

## 2013-05-01 MED ORDER — SODIUM CHLORIDE 0.9 % IV SOLN: INTRAVENOUS | Status: AC

## 2013-05-01 MED ORDER — LEVALBUTEROL HCL 0.63 MG/3ML IN NEBU: 0.6300 mg | INHALATION_SOLUTION | RESPIRATORY_TRACT | Status: DC | PRN

## 2013-05-01 MED ORDER — VERAPAMIL HCL 2.5 MG/ML IV SOLN
INTRAVENOUS | Status: AC
  Filled 2013-05-01: qty 2

## 2013-05-01 MED ORDER — FUROSEMIDE 10 MG/ML IJ SOLN
INTRAMUSCULAR | Status: AC
  Filled 2013-05-01: qty 4

## 2013-05-01 MED ORDER — NITROGLYCERIN 0.2 MG/ML ON CALL CATH LAB
INTRAVENOUS | Status: AC
  Filled 2013-05-01: qty 1

## 2013-05-01 MED ORDER — HYDRALAZINE HCL 20 MG/ML IJ SOLN
INTRAMUSCULAR | Status: AC
  Filled 2013-05-01: qty 1

## 2013-05-01 MED ORDER — FUROSEMIDE 10 MG/ML IJ SOLN
20.0000 mg | Freq: Once | INTRAMUSCULAR | Status: AC
  Administered 2013-05-01: 20 mg via INTRAVENOUS

## 2013-05-01 MED ORDER — SODIUM CHLORIDE 0.9 % IJ SOLN
3.0000 mL | Freq: Two times a day (BID) | INTRAMUSCULAR | Status: DC
  Administered 2013-05-01 – 2013-05-03 (×5): 3 mL via INTRAVENOUS

## 2013-05-01 MED ORDER — MIDAZOLAM HCL 2 MG/2ML IJ SOLN
INTRAMUSCULAR | Status: AC
  Filled 2013-05-01: qty 2

## 2013-05-01 MED ORDER — FENTANYL CITRATE 0.05 MG/ML IJ SOLN
INTRAMUSCULAR | Status: AC
  Filled 2013-05-01: qty 2

## 2013-05-01 MED ORDER — LIDOCAINE HCL (PF) 1 % IJ SOLN
INTRAMUSCULAR | Status: AC
  Filled 2013-05-01: qty 30

## 2013-05-01 MED ORDER — DILTIAZEM HCL ER COATED BEADS 240 MG PO CP24
240.0000 mg | ORAL_CAPSULE | Freq: Every day | ORAL | Status: DC
  Administered 2013-05-01 – 2013-05-02 (×2): 240 mg via ORAL
  Filled 2013-05-01 (×2): qty 1

## 2013-05-01 MED ORDER — HEPARIN (PORCINE) IN NACL 2-0.9 UNIT/ML-% IJ SOLN
INTRAMUSCULAR | Status: AC
  Filled 2013-05-01: qty 1000

## 2013-05-01 MED ORDER — SODIUM CHLORIDE 0.9 % IV SOLN: 250.0000 mL | INTRAVENOUS | Status: DC | PRN

## 2013-05-01 MED ORDER — HEPARIN SODIUM (PORCINE) 5000 UNIT/ML IJ SOLN
5000.0000 [IU] | Freq: Three times a day (TID) | INTRAMUSCULAR | Status: DC
  Administered 2013-05-01 – 2013-05-03 (×5): 5000 [IU] via SUBCUTANEOUS
  Filled 2013-05-01 (×8): qty 1

## 2013-05-01 MED ORDER — FUROSEMIDE 10 MG/ML IJ SOLN
40.0000 mg | Freq: Once | INTRAMUSCULAR | Status: AC
  Administered 2013-05-01: 40 mg via INTRAVENOUS
  Filled 2013-05-01: qty 4

## 2013-05-01 MED ORDER — NITROGLYCERIN 0.4 MG SL SUBL: 0.4000 mg | SUBLINGUAL_TABLET | Freq: Once | SUBLINGUAL | Status: DC

## 2013-05-01 MED ORDER — SODIUM CHLORIDE 0.9 % IJ SOLN: 3.0000 mL | INTRAMUSCULAR | Status: DC | PRN

## 2013-05-01 MED ORDER — HYDRALAZINE HCL 20 MG/ML IJ SOLN
10.0000 mg | INTRAMUSCULAR | Status: DC | PRN
  Administered 2013-05-01: 10 mg via INTRAVENOUS

## 2013-05-01 NOTE — CV Procedure (Signed)
CARDIAC CATHETERIZATIONREPORT  NAME:  Joanna Hood   MRN: [id number] DOB:  05-09-1946   ADMIT DATE: 04/29/2013 Procedure Date: 05/01/2013  INTERVENTIONAL CARDIOLOGIST: Marykay Lex, M.D., MS PRIMARY CARE PROVIDER: No PCP Per Patient PRIMARY CARDIOLOGIST: Lennette Bihari, M.D.  PATIENT:  Joanna Hood is a 67 y.o. female  with PMH of HTN & Hypothyroidism admitted with 1 week hx of worsening shortness of breath and trouble laying down flat. Denies any chest pain or headache. She has been out of her meds for the past 1 month. She currently has no PCP. Denies significant leg swelling but she has had cough when she lays down. She has never been seen by cardiology. She presented to ER due to not being able to breathe and found to be in a.fib w RVR HR in 140's, HR Decreased down to 72 on diltiazem gtt her blood pressure was noted to be elevated in 180's systolic.  Admitting ECG with A fib/flutter rate controlled with deep t wave inversion V2, V3, V4, and V5-6 vs baseline wander.  Based upon new on set of Afib & abnormal / dynamic ST-T segment changes & new onset CHF, she is referred for cardiac catheterization to delineate her coronary anatomy.  PRE-OPERATIVE DIAGNOSIS:    Acute Coronary Syndrome, either related to new-onset atrial fibrillation with hypertension versus CAD  Acute likely diastolic heart failure in setting of A. fib RVR  PROCEDURES PERFORMED:    LEFT HEART CATHETERIZATION WITH CORONARY ANGIOGRAPHY   PROCEDURE:Consent:  Risks of procedure as well as the alternatives and risks of each were explained to the (patient/caregiver).  Consent for procedure obtained. Consent for signed by MD and patient with RN witness -- placed on chart.   PROCEDURE: The patient was brought to the 2nd Floor [facility] in the fasting state and prepped and draped in the usual sterile fashion for Right groin or radial access. A modified Allen's test with  plethysmography was performed, revealing excellent Ulnar artery collateral flow.  Sterile technique was used including antiseptics, cap, gloves, gown, hand hygiene, mask and sheet.  Skin prep: Chlorhexidine.  Time Out: Verified patient identification, verified procedure, site/side was marked, verified correct patient position, special equipment/implants available, medications/allergies/relevent history reviewed, required imaging and test results available.  Performed  Access:   Intial attempts at Right Radial Artery access were unsuccessful due to vasospasm.  Good arterial flow, but unable to advance wire.  Right Common Femoral Artery: 5 Fr sheath; fluoroscopicaly guided modified Seldinger technique  Diagnostic:  JL4, JR 4, Angled Pigtail)  Left Coronary Artery Angiography: JL 4  Right Coronary Artery Angiography: JR4  LV Hemodynamics (LV Gram): Angled Pigtail  Sheath: to be removed in PACU Holding area.   MEDICATIONS:  Anesthesia:  Local Lidocaine 16 ml  Sedation:  1 mg IV Versed, 25 mcg IV fentanyl ;   Omnipaque Contrast: 50 ml  Lasix 20 mg IV x2 (first and Cath Lab second upon arrival to PACU holding area)  Hemodynamics:  Central Aortic / Mean Pressures: 124/74 mmHg; 94 mmHg  Left Ventricular Pressures / EDP: 129/21 mmHg; 27 mmHg  Left Ventriculography:  EF:  45-50%  Wall Motion: Mild Global hypokinesis  Coronary Anatomy: Overall minimal luminal irregularities.   Left Main: Large caliber vessel, trifurcates into LAD, Circumflex & Ramus Intermedius.  Angiographically normal. LAD: Large caliber vessel that has only small diagonal branches + septal perforators.  Angiographically normal.  Left Circumflex: Moderate to large caliber vessel that takes an  anterior course before making a 80% turn into the AV Groove.  2 small-moderate OM branches with 2 small LPL branches.  Angiographically normal.   Ramus intermedius: Large caliber vessel - bifurcates in mid vessel.  Angiographically normal    RCA: Large caliber vessel with 2 acute angles, then bifurcates distally into the Right Posterior Descending Artery and the  Right Posterior AV Groove Branch (RPAV).  Angiographically normal  RPDA: Small to moderate caliber vessel that reaches ~2/3 the way to the apex.  RPL Sysytem:The RPAV is a very small branch with 2 PL branches. Angiographically normal  PATIENT DISPOSITION:    The patient was transferred to the PACU holding area in a hemodynamicaly stable, chest pain free condition.  The patient tolerated the procedure well, and there were no complications.  EBL:   < 10 ml  The patient was stable before, during, and after the procedure.  POST-OPERATIVE DIAGNOSIS:    Essentially angiographically normal coronary arteries with no obvious culprit lesion for her symptoms. This would likely exclude acute coronary syndrome as the etiology. More likely acute hypertensive/diastolic heart failure in the setting of new onset A. fib.  Severely elevated LVEDP consistent with elevated filling pressures/diastolic heart failure.  PLAN OF CARE:  Clear evidence of elevated filling pressures consistent with diastolic heart failure. The patient was coughing quite a bit of table and was somewhat dyspneic requiring oxygen, this would suggest that she still has need for diuresis. We've given her a total of 40 mg of Lasix during and after the procedure with 2 boluses of 20 mg each.  Continue aggressive blood pressure control for afterload reduction as well as additional diuresis as needed. Would recommend monitoring her at least one more day to ensure adequate blood pressure control and diuresis.  I have not restarted heparin post cath, would recommend considering initiation of long-term anticoagulation as part of the discharge plan. Would consider NOAC in order to expedite the discharge process and avoid having a bridge to therapeutic warfarin. She can always convert to a  different option in the outpatient setting.  She'll need followup with Dr. Tresa Endo or advance level provider shortly following discharge for followup evaluation.   Marykay Lex, M.D., M.S. [facility] [address] [place], [place]  [address]  [phone]  05/01/2013 1:14 PM

## 2013-05-01 NOTE — H&P (View-Only) (Signed)
67 y/o woman with PMH of HTN & Hypothyroidism admitted with 1 week hx of worsening shortness of breath and trouble laying down flat. Denies any chest pain or headache. She has been out of her meds for the past 1 month. She currently has no PCP. Denies significant leg swelling but she has had cough when she lays down. She has never been seen by cardiology. She presented to ER due to not being able to breathe and found to be in a.fib w RVR HR in 140's, HR Decreased down to 72 on diltiazem gtt her blood pressure was noted to be elevated in 180's systolic.  Admitting ECG with A fib/flutter rate controlled with deep t wave inversion V2, V3, V4, and V5-6 vs baseline wander. Based upon new on set of Afib & abnormal / dynamic ST-T segment changes & new onset CHF - referred for cardiac catheterization to delineate her coronary anatomy.  Subjective: No complaints. Denies CP/SOB.   Objective: Vital signs in last 24 hours: Temp:  [96.4 F (35.8 C)-98.7 F (37.1 C)] 98.7 F (37.1 C) (11/10 0332) Pulse Rate:  [68] 68 (11/09 2205) Resp:  [18-26] 18 (11/09 1950) BP: (146-164)/(74-85) 157/85 mmHg (11/09 2205) SpO2:  [95 %-99 %] 99 % (11/10 0332) Weight:  [171 lb (77.565 kg)] 171 lb (77.565 kg) (11/10 0500) Last BM Date: 04/28/13  Intake/Output from previous day: 11/09 0701 - 11/10 0700 In: 881.7 [P.O.:120; I.V.:761.7] Out: 1350 [Urine:1350] Intake/Output this shift:    Medications Current Facility-Administered Medications  Medication Dose Route Frequency Provider Last Rate Last Dose  . 0.9 %  sodium chloride infusion  250 mL Intravenous PRN Therisa Doyne, MD 10 mL/hr at 04/30/13 2300 250 mL at 04/30/13 2300  . 0.9 %  sodium chloride infusion  250 mL Intravenous PRN Nada Boozer, NP      . 0.9 %  sodium chloride infusion  1 mL/kg/hr Intravenous Continuous Nada Boozer, NP 73.6 mL/hr at 05/01/13 0700 1 mL/kg/hr at 05/01/13 0700  . acetaminophen (TYLENOL) tablet 650 mg  650 mg Oral Q4H PRN  Therisa Doyne, MD      . Melene Muller ON 05/02/2013] aspirin EC tablet 81 mg  81 mg Oral Daily Saima Rizwan, MD      . diazepam (VALIUM) tablet 5 mg  5 mg Oral On Call Nada Boozer, NP      . diltiazem (CARDIZEM LA) 24 hr tablet 240 mg  240 mg Oral Daily Lennette Bihari, MD   240 mg at 04/30/13 1057  . heparin ADULT infusion 100 units/mL (25000 units/250 mL)  800 Units/hr Intravenous Continuous Colleen Can, RPH 8 mL/hr at 05/01/13 0558 800 Units/hr at 05/01/13 0558  . levothyroxine (SYNTHROID, LEVOTHROID) tablet 125 mcg  125 mcg Oral QAC breakfast Calvert Cantor, MD   125 mcg at 05/01/13 0745  . magnesium oxide (MAG-OX) tablet 400 mg  400 mg Oral BID Calvert Cantor, MD   400 mg at 04/30/13 2206  . metoprolol tartrate (LOPRESSOR) tablet 25 mg  25 mg Oral BID Calvert Cantor, MD   25 mg at 04/30/13 2205  . ondansetron (ZOFRAN) injection 4 mg  4 mg Intravenous Q6H PRN Therisa Doyne, MD      . pantoprazole (PROTONIX) EC tablet 40 mg  40 mg Oral Q1200 Therisa Doyne, MD   40 mg at 04/30/13 1439  . potassium chloride SA (K-DUR,KLOR-CON) CR tablet 40 mEq  40 mEq Oral Daily Lennette Bihari, MD      . ramipril (ALTACE) capsule  10 mg  10 mg Oral BID Calvert Cantor, MD   10 mg at 04/30/13 2205  . sodium chloride 0.9 % injection 3 mL  3 mL Intravenous Q12H Therisa Doyne, MD   3 mL at 04/30/13 2205  . sodium chloride 0.9 % injection 3 mL  3 mL Intravenous PRN Therisa Doyne, MD      . sodium chloride 0.9 % injection 3 mL  3 mL Intravenous Q12H Nada Boozer, NP      . sodium chloride 0.9 % injection 3 mL  3 mL Intravenous PRN Nada Boozer, NP        PE: General appearance: alert, cooperative and no distress Lungs: decrease breath sounds at the bases Heart: regularly irregular rhythm and 2/6 blowing murmur best heard at the apex Extremities: no LEE Pulses: 2+ and symmetric Skin: warm and dry Neurologic: Grossly normal  Lab Results:   Recent Labs  04/29/13 1140 04/30/13 0200  05/01/13 0500  WBC 5.5 5.5 5.5  HGB 11.1* 10.2* 11.2*  HCT 33.0* 30.4* 33.9*  PLT 163 153 178   BMET  Recent Labs  04/29/13 1140 04/30/13 0200 05/01/13 0500  NA 137 136 134*  K 2.7* 3.1* 4.4  CL 98 99 101  CO2 28 27 22   GLUCOSE 110* 100* 104*  BUN 14 22 21   CREATININE 0.75 0.99 1.03  CALCIUM 9.0 8.8 8.9   PT/INR  Recent Labs  05/01/13 0500  LABPROT 13.0  INR 1.00   Lipid Panel     Component Value Date/Time   CHOL 184 12/03/2009 2304   TRIG 79 12/03/2009 2304   HDL 59 12/03/2009 2304   CHOLHDL 3.1 Ratio 12/03/2009 2304   VLDL 16 12/03/2009 2304   LDLCALC 109* 12/03/2009 2304   Cardiac Panel (last 3 results)  Recent Labs  04/29/13 1310 04/29/13 1900 04/30/13 0200  CKTOTAL 229*  --   --   CKMB 6.6*  --   --   TROPONINI <0.30 <0.30 <0.30  RELINDX 2.9*  --   --     Assessment/Plan  Principal Problem:   Acute respiratory failure Active Problems:   HYPOKALEMIA   Pulmonary edema   Atrial fibrillation with RVR   Hypokalemia   HTN (hypertension), malignant  Plan: Maintaining NSR/ sinus brady. On PO Cardizem. BP stable. EKG demonstrates nonspecific T wave abnormalities. Negative troponins. Positive CK, CKMB. Plan for diagnostic LHC today. Renal function and IRN WNL.  Hypokalemia resolved. She has been NPO since midnight. MD to follow.     LOS: 2 days    Brittainy M. Sharol Harness, PA-C 05/01/2013 8:25 AM  I have seen and evaluated the patient this AM along with Boyce Medici, PA. I agree with her findings, examination as well as impression recommendations.  Continues to maintain NSR - no longer Afib/Flutter.  Electrolytes stable. -- on PO CCB & BB. Diuresed overnight & breathing better.  Per Dr. Landry Dyke recommendations given new onset HF Sx & Afib with "dynamic ECG" changes c/w possible ischemia, we will proceed with Diagnostic LHC +/- PCI today for Possible ACS (has r/o for MI)   Further plans based upon Cath findings.  Marykay Lex, M.D.,  M.S. [facility] [address] [place], [place]  [address]  [phone] Pager # [phone] 05/01/2013 9:32 AM

## 2013-05-01 NOTE — Interval H&P Note (Signed)
History and Physical Interval Note:  05/01/2013 9:38 AM  Joanna Hood  has presented today for surgery, with the diagnosis of CHF/AFIB & POSSIBLE ACS.  The various methods of treatment have been discussed with the patient and family. After consideration of risks, benefits and other options for treatment, the patient has consented to  Procedure(s): LEFT HEART CATHETERIZATION WITH CORONARY ANGIOGRAM (N/A) +/- PCI as a surgical intervention .  The patient's history has been reviewed, patient examined, no change in status, stable for surgery.  I have reviewed the patient's chart and labs.  Questions were answered to the patient's satisfaction.     Nola Botkins W  Cath Lab Visit (complete for each Cath Lab visit)  Clinical Evaluation Leading to the Procedure:   ACS: yes  Non-ACS:    Anginal Classification: CCS III  Anti-ischemic medical therapy: Maximal Therapy (2 or more classes of medications)  Non-Invasive Test Results: No non-invasive testing performed  Prior CABG: No previous CABG

## 2013-05-01 NOTE — Progress Notes (Signed)
ANTICOAGULATION CONSULT NOTE - Follow Up Consult  Pharmacy Consult for heparin Indication: atrial fibrillation  Labs:  Recent Labs  04/29/13 0213 04/29/13 1140 04/29/13 1310 04/29/13 1900 04/30/13 0200 04/30/13 1700 04/30/13 2359 05/01/13 0500  HGB 13.9 11.1*  --   --  10.2*  --   --  11.2*  HCT 41.0 33.0*  --   --  30.4*  --   --  33.9*  PLT  --  163  --   --  153  --   --  178  LABPROT  --   --   --   --   --   --   --  13.0  INR  --   --   --   --   --   --   --  1.00  HEPARINUNFRC  --   --   --   --   --  0.82* 0.71* 0.73*  CREATININE 1.10 0.75  --   --  0.99  --   --   --   CKTOTAL  --   --  229*  --   --   --   --   --   CKMB  --   --  6.6*  --   --   --   --   --   TROPONINI  --   --  <0.30 <0.30 <0.30  --   --   --     Assessment: 67yo female with heparin level trending back up.  Goal of Therapy:  Heparin level 0.3-0.7 units/ml   Plan:  Will decrease heparin gtt by 1-2 units/kg/hr to 800 units/hr and check level in 6-8hr.  Vernard Gambles, PharmD, BCPS  05/01/2013,5:50 AM

## 2013-05-01 NOTE — Progress Notes (Signed)
 67 y/o woman with PMH of HTN & Hypothyroidism admitted with 1 week hx of worsening shortness of breath and trouble laying down flat. Denies any chest pain or headache. She has been out of her meds for the past 1 month. She currently has no PCP. Denies significant leg swelling but she has had cough when she lays down. She has never been seen by cardiology. She presented to ER due to not being able to breathe and found to be in a.fib w RVR HR in 140's, HR Decreased down to 72 on diltiazem gtt her blood pressure was noted to be elevated in 180's systolic.  Admitting ECG with A fib/flutter rate controlled with deep t wave inversion V2, V3, V4, and V5-6 vs baseline wander. Based upon new on set of Afib & abnormal / dynamic ST-T segment changes & new onset CHF - referred for cardiac catheterization to delineate her coronary anatomy.  Subjective: No complaints. Denies CP/SOB.   Objective: Vital signs in last 24 hours: Temp:  [96.4 F (35.8 C)-98.7 F (37.1 C)] 98.7 F (37.1 C) (11/10 0332) Pulse Rate:  [68] 68 (11/09 2205) Resp:  [18-26] 18 (11/09 1950) BP: (146-164)/(74-85) 157/85 mmHg (11/09 2205) SpO2:  [95 %-99 %] 99 % (11/10 0332) Weight:  [171 lb (77.565 kg)] 171 lb (77.565 kg) (11/10 0500) Last BM Date: 04/28/13  Intake/Output from previous day: 11/09 0701 - 11/10 0700 In: 881.7 [P.O.:120; I.V.:761.7] Out: 1350 [Urine:1350] Intake/Output this shift:    Medications Current Facility-Administered Medications  Medication Dose Route Frequency Provider Last Rate Last Dose  . 0.9 %  sodium chloride infusion  250 mL Intravenous PRN Anastassia Doutova, MD 10 mL/hr at 04/30/13 2300 250 mL at 04/30/13 2300  . 0.9 %  sodium chloride infusion  250 mL Intravenous PRN Laura Ingold, NP      . 0.9 %  sodium chloride infusion  1 mL/kg/hr Intravenous Continuous Laura Ingold, NP 73.6 mL/hr at 05/01/13 0700 1 mL/kg/hr at 05/01/13 0700  . acetaminophen (TYLENOL) tablet 650 mg  650 mg Oral Q4H PRN  Anastassia Doutova, MD      . [START ON 05/02/2013] aspirin EC tablet 81 mg  81 mg Oral Daily Saima Rizwan, MD      . diazepam (VALIUM) tablet 5 mg  5 mg Oral On Call Laura Ingold, NP      . diltiazem (CARDIZEM LA) 24 hr tablet 240 mg  240 mg Oral Daily Thomas A Kelly, MD   240 mg at 04/30/13 1057  . heparin ADULT infusion 100 units/mL (25000 units/250 mL)  800 Units/hr Intravenous Continuous Veronda Pauline Bryk, RPH 8 mL/hr at 05/01/13 0558 800 Units/hr at 05/01/13 0558  . levothyroxine (SYNTHROID, LEVOTHROID) tablet 125 mcg  125 mcg Oral QAC breakfast Saima Rizwan, MD   125 mcg at 05/01/13 0745  . magnesium oxide (MAG-OX) tablet 400 mg  400 mg Oral BID Saima Rizwan, MD   400 mg at 04/30/13 2206  . metoprolol tartrate (LOPRESSOR) tablet 25 mg  25 mg Oral BID Saima Rizwan, MD   25 mg at 04/30/13 2205  . ondansetron (ZOFRAN) injection 4 mg  4 mg Intravenous Q6H PRN Anastassia Doutova, MD      . pantoprazole (PROTONIX) EC tablet 40 mg  40 mg Oral Q1200 Anastassia Doutova, MD   40 mg at 04/30/13 1439  . potassium chloride SA (K-DUR,KLOR-CON) CR tablet 40 mEq  40 mEq Oral Daily Thomas A Kelly, MD      . ramipril (ALTACE) capsule   10 mg  10 mg Oral BID Saima Rizwan, MD   10 mg at 04/30/13 2205  . sodium chloride 0.9 % injection 3 mL  3 mL Intravenous Q12H Anastassia Doutova, MD   3 mL at 04/30/13 2205  . sodium chloride 0.9 % injection 3 mL  3 mL Intravenous PRN Anastassia Doutova, MD      . sodium chloride 0.9 % injection 3 mL  3 mL Intravenous Q12H Laura Ingold, NP      . sodium chloride 0.9 % injection 3 mL  3 mL Intravenous PRN Laura Ingold, NP        PE: General appearance: alert, cooperative and no distress Lungs: decrease breath sounds at the bases Heart: regularly irregular rhythm and 2/6 blowing murmur best heard at the apex Extremities: no LEE Pulses: 2+ and symmetric Skin: warm and dry Neurologic: Grossly normal  Lab Results:   Recent Labs  04/29/13 1140 04/30/13 0200  05/01/13 0500  WBC 5.5 5.5 5.5  HGB 11.1* 10.2* 11.2*  HCT 33.0* 30.4* 33.9*  PLT 163 153 178   BMET  Recent Labs  04/29/13 1140 04/30/13 0200 05/01/13 0500  NA 137 136 134*  K 2.7* 3.1* 4.4  CL 98 99 101  CO2 28 27 22  GLUCOSE 110* 100* 104*  BUN 14 22 21  CREATININE 0.75 0.99 1.03  CALCIUM 9.0 8.8 8.9   PT/INR  Recent Labs  05/01/13 0500  LABPROT 13.0  INR 1.00   Lipid Panel     Component Value Date/Time   CHOL 184 12/03/2009 2304   TRIG 79 12/03/2009 2304   HDL 59 12/03/2009 2304   CHOLHDL 3.1 Ratio 12/03/2009 2304   VLDL 16 12/03/2009 2304   LDLCALC 109* 12/03/2009 2304   Cardiac Panel (last 3 results)  Recent Labs  04/29/13 1310 04/29/13 1900 04/30/13 0200  CKTOTAL 229*  --   --   CKMB 6.6*  --   --   TROPONINI <0.30 <0.30 <0.30  RELINDX 2.9*  --   --     Assessment/Plan  Principal Problem:   Acute respiratory failure Active Problems:   HYPOKALEMIA   Pulmonary edema   Atrial fibrillation with RVR   Hypokalemia   HTN (hypertension), malignant  Plan: Maintaining NSR/ sinus brady. On PO Cardizem. BP stable. EKG demonstrates nonspecific T wave abnormalities. Negative troponins. Positive CK, CKMB. Plan for diagnostic LHC today. Renal function and IRN WNL.  Hypokalemia resolved. She has been NPO since midnight. MD to follow.     LOS: 2 days    Brittainy M. Simmons, PA-C 05/01/2013 8:25 AM  I have seen and evaluated the patient this AM along with Brittany Simmons, PA. I agree with her findings, examination as well as impression recommendations.  Continues to maintain NSR - no longer Afib/Flutter.  Electrolytes stable. -- on PO CCB & BB. Diuresed overnight & breathing better.  Per Dr. Kelly's recommendations given new onset HF Sx & Afib with "dynamic ECG" changes c/w possible ischemia, we will proceed with Diagnostic LHC +/- PCI today for Possible ACS (has r/o for MI)   Further plans based upon Cath findings.  HARDING,DAVID W, M.D.,  M.S. [facility] [address] [place], [place]  [address]  [phone] Pager # [phone] 05/01/2013 9:32 AM     

## 2013-05-01 NOTE — Progress Notes (Signed)
ANTICOAGULATION CONSULT NOTE - Follow Up Consult  Pharmacy Consult for heparin Indication: atrial fibrillation  Labs:  Recent Labs  04/29/13 0208 04/29/13 0213 04/29/13 1140 04/29/13 1310 04/29/13 1900 04/30/13 0200 04/30/13 1700 04/30/13 2359  HGB 11.9* 13.9 11.1*  --   --  10.2*  --   --   HCT 35.4* 41.0 33.0*  --   --  30.4*  --   --   PLT 192  --  163  --   --  153  --   --   HEPARINUNFRC  --   --   --   --   --   --  0.82* 0.71*  CREATININE  --  1.10 0.75  --   --  0.99  --   --   CKTOTAL  --   --   --  229*  --   --   --   --   CKMB  --   --   --  6.6*  --   --   --   --   TROPONINI  --   --   --  <0.30 <0.30 <0.30  --   --     Assessment/Plan: 67yo female remains slightly supratherapeutic on heparin but will not change for now as very close to goal.  Will confirm with am labs.  Vernard Gambles, PharmD, BCPS  05/01/2013,12:37 AM

## 2013-05-01 NOTE — Progress Notes (Signed)
[facility] Progress Note [place] TEAM 1 - Stepdown/ICU TEAM   Joanna Hood AVW:[id number] DOB: [birth year] DOA: 04/29/2013 PCP: No PCP Per Patient  Brief narrative: 67 y/o wih hypothyroidism and HTN who saw dr Roseanne Reno as an outpt 1 month ago and was given prescriptions for Norvasc and Lasix. She did not return and ran out of medications over 1 wk ago.  Now presents with 1 week hx of worsening shortness of breath and trouble laying down flat. Denies any chest pain or headache. She has been out of her meds for the past 1 month. Denies significant leg swelling but she has had cough when she lays down. She has never been seen by cardiology. She presented to ER with a.fib w RVR HR in 140's currently HR down to 72 on diltiazem gtt her blood pressure was noted to be elevated in 180's.  After receiving lasix and being started on nitro gtt patient has diuresed and is feeling better.  She has currently diuresed over 3-1/2 L. She status post a cardiac catheterization noting clean coronary vessels on 11/10.   Assessment/Plan: Principal Problem:   Acute respiratory failure/  Pulmonary edema - cont to control rate- - will d/c Lasix now as she has diureses sufficiently  - EF WNL therefore cause is likely A-fib with RVR  Abnormal EKG inf/ lat T wave inversions - Cath clean vessels. Heart failure is from poorly controlled hypertension and medical noncompliance - appreciate cardio assistance     HYPOKALEMIA - aggressive replacement PO and IV  Hypomag -repalce PO and IV and recheck    Atrial fibrillation with RVR -Cardizem and drip discontinued yesterday. On by mouth Cardizem and metoprolol. Heart rate in the 50s-60s  Significantly elevated TSH - cont Synthroid - pt was not taking it either -TSH markedly elevated but free T4 within normal limits. Recommending continuing current dose of Synthroid and recheck TSH in 6-12 weeks    HTN (hypertension), malignant -increased ACE dose and  Metoprolol- d/c Norvasc- cont CArdizem. Pressures ranging systolic 130s-150s  Non-compliance with medical follow up-  Pt has insurance, saw Dr Roseanne Reno at [facility] 1 mo ago but never returned - tells me that she understands the importance of follow up now   Code Status: full Family Communication: none Disposition Plan: Transfer to floor-telemetry after 6 PM (4 hour post cath)  Consultants: Cardiology  Procedures: none  Antibiotics: none  DVT prophylaxis: Lovenox  HPI/Subjective: Patient seen after cardiac catheterization. Doing well. No complaints. Breathing much easier. No pain. Some soreness at the site of cath   Objective: Blood pressure 154/84, pulse 56, temperature 97.7 F (36.5 C), temperature source Oral, resp. rate 36, height 5\' 5"  (1.651 m), weight 77.565 kg (171 lb), SpO2 94.00%.  Intake/Output Summary (Last 24 hours) at 05/01/13 1637 Last data filed at 05/01/13 1600  Gross per 24 hour  Intake 1071.18 ml  Output   1900 ml  Net -828.82 ml     Exam: General: Alert and oriented x3, No acute respiratory distress Lungs: Minimal crackles bibasilar Cardiovascular: Irregular rate and rhythm without murmur gallop or rub normal S1 and S2, rate controlled Abdomen: Nontender, nondistended, soft, bowel sounds positive, no rebound, no ascites, no appreciable mass Extremities: No significant cyanosis, clubbing, trace pitting edema  Data Reviewed: Basic Metabolic Panel:  Recent Labs Lab 04/29/13 0213 04/29/13 0218 04/29/13 1140 04/29/13 1310 04/30/13 0200 05/01/13 0500 05/01/13 1510  NA 141  --  137  --  136 134*  --  K 3.2*  --  2.7*  --  3.1* 4.4  --   CL 100  --  98  --  99 101  --   CO2  --   --  28  --  27 22  --   GLUCOSE 149*  --  110*  --  100* 104*  --   BUN 19  --  14  --  22 21  --   CREATININE 1.10  --  0.75  --  0.99 1.03 0.90  CALCIUM  --   --  9.0  --  8.8 8.9  --   MG  --  1.7  --  1.4* 1.4* 2.2  --    Liver Function Tests: No  results found for this basename: AST, ALT, ALKPHOS, BILITOT, PROT, ALBUMIN,  in the last 168 hours No results found for this basename: LIPASE, AMYLASE,  in the last 168 hours No results found for this basename: AMMONIA,  in the last 168 hours CBC:  Recent Labs Lab 04/29/13 0208 04/29/13 0213 04/29/13 1140 04/30/13 0200 05/01/13 0500 05/01/13 1510  WBC 5.2  --  5.5 5.5 5.5 5.8  NEUTROABS 2.5  --  3.5  --   --   --   HGB 11.9* 13.9 11.1* 10.2* 11.2* 10.4*  HCT 35.4* 41.0 33.0* 30.4* 33.9* 31.4*  MCV 73.9*  --  73.5* 74.1* 74.2* 74.6*  PLT 192  --  163 153 178 176   Cardiac Enzymes:  Recent Labs Lab 04/29/13 1310 04/29/13 1900 04/30/13 0200  CKTOTAL 229*  --   --   CKMB 6.6*  --   --   TROPONINI <0.30 <0.30 <0.30   BNP (last 3 results)  Recent Labs  04/29/13 0218  PROBNP 2162.0*   CBG: No results found for this basename: GLUCAP,  in the last 168 hours  Recent Results (from the past 240 hour(s))  MRSA PCR SCREENING     Status: None   Collection Time    04/29/13  5:40 AM      Result Value Range Status   MRSA by PCR NEGATIVE  NEGATIVE Final   Comment:            The GeneXpert MRSA Assay (FDA     approved for NASAL specimens     only), is one component of a     comprehensive MRSA colonization     surveillance program. It is not     intended to diagnose MRSA     infection nor to guide or     monitor treatment for     MRSA infections.     Studies:  Recent x-ray studies have been reviewed in detail by the Attending Physician  Scheduled Meds:  Scheduled Meds: . [START ON 05/02/2013] aspirin EC  81 mg Oral Daily  . diltiazem  240 mg Oral Daily  . heparin  5,000 Units Subcutaneous Q8H  . levothyroxine  125 mcg Oral QAC breakfast  . magnesium oxide  400 mg Oral BID  . metoprolol tartrate  25 mg Oral BID  . nitroGLYCERIN  0.4 mg Sublingual Once  . pantoprazole  40 mg Oral Q1200  . potassium chloride  40 mEq Oral Daily  . ramipril  10 mg Oral BID  . sodium  chloride  3 mL Intravenous Q12H  . sodium chloride  3 mL Intravenous Q12H   Continuous Infusions: . sodium chloride 50 mL/hr at 05/01/13 1345    Time spent on care of this  patient: 25 min   Hollice Espy, MD  [facility] Office  [phone] Pager [phone]  On-Call/Text Page:      [email or web address]      password [email or web address]  If 7PM-7AM, please contact night-coverage [email or web address] Password [email or web address] 05/01/2013, 4:37 PM   LOS: 2 days

## 2013-05-02 DIAGNOSIS — Z9119 Patient's noncompliance with other medical treatment and regimen: Secondary | ICD-10-CM

## 2013-05-02 DIAGNOSIS — I5031 Acute diastolic (congestive) heart failure: Principal | ICD-10-CM

## 2013-05-02 LAB — CBC
HCT: 29.4 % — ABNORMAL LOW (ref 36.0–46.0)
Hemoglobin: 9.8 g/dL — ABNORMAL LOW (ref 12.0–15.0)
MCH: 24.9 pg — ABNORMAL LOW (ref 26.0–34.0)
MCHC: 33.3 g/dL (ref 30.0–36.0)
MCV: 74.6 fL — ABNORMAL LOW (ref 78.0–100.0)
Platelets: 147 10*3/uL — ABNORMAL LOW (ref 150–400)
RBC: 3.94 MIL/uL (ref 3.87–5.11)

## 2013-05-02 LAB — BASIC METABOLIC PANEL
BUN: 17 mg/dL (ref 6–23)
CO2: 25 mEq/L (ref 19–32)
Calcium: 8.9 mg/dL (ref 8.4–10.5)
GFR calc non Af Amer: 67 mL/min — ABNORMAL LOW (ref >90)
Glucose, Bld: 80 mg/dL (ref 70–99)
Potassium: 3.7 mEq/L (ref 3.5–5.1)

## 2013-05-02 MED ORDER — METOPROLOL TARTRATE 12.5 MG HALF TABLET
12.5000 mg | ORAL_TABLET | Freq: Two times a day (BID) | ORAL | Status: DC
  Administered 2013-05-02 – 2013-05-03 (×2): 12.5 mg via ORAL
  Filled 2013-05-02 (×3): qty 1

## 2013-05-02 MED ORDER — FUROSEMIDE 20 MG PO TABS
20.0000 mg | ORAL_TABLET | Freq: Every day | ORAL | Status: DC
  Administered 2013-05-02 – 2013-05-03 (×2): 20 mg via ORAL
  Filled 2013-05-02 (×2): qty 1

## 2013-05-02 NOTE — Progress Notes (Signed)
[facility] Progress Note [place] TEAM 1 - Stepdown/ICU TEAM   Jillian Warth ZOX:[id number] DOB: 10-30-1945 DOA: 04/29/2013 PCP: No PCP Per Patient  Brief narrative: 67 y/o wih hypothyroidism and HTN who saw dr Roseanne Reno as an outpt 1 month ago and was given prescriptions for Norvasc and Lasix. She did not return and ran out of medications over 1 wk ago.  Now presents with 1 week hx of worsening shortness of breath and trouble laying down flat. Denies any chest pain or headache. She has been out of her meds for the past 1 month. Denies significant leg swelling but she has had cough when she lays down. She has never been seen by cardiology. She presented to ER with a.fib w RVR HR in 140's currently HR down to 72 on diltiazem gtt her blood pressure was noted to be elevated in 180's.  After receiving lasix and being started on nitro gtt patient has diuresed and is feeling better.  She has currently diuresed over 3-1/2 L. She status post a cardiac catheterization noting clean coronary vessels on 11/10.   Assessment/Plan: Principal Problem:   Acute respiratory failure/  Pulmonary edema - cont to control rate- - needed and extra dose of Lasix yesterday - cath does not reveal any significant stenosis - EF WNL therefore cause is likely A-fib with RVR - repeat CXR in AM  Abnormal EKG inf/ lat T wave inversions - Cath clean vessels. Heart failure is from poorly controlled hypertension and medical noncompliance - appreciate cardio assistance     HYPOKALEMIA - aggressivly replaced PO and IV  Hypomag -repalced PO and IV and recheck    Atrial fibrillation with RVR- currently in junctional rhythm  -Cardizem and drip discontinued-. On by mouth Cardizem and metoprolol. Heart rate in the 50s-60s  Significantly elevated TSH - cont Synthroid - pt was not taking it either -TSH markedly elevated but free T4 within normal limits. Recommending continuing current dose of Synthroid and recheck  TSH in 6-12 weeks    HTN (hypertension), malignant -increased ACE dose and Metoprolol- d/c Norvasc- cont CArdizem. Pressures ranging systolic 130s-150s  Non-compliance with medical follow up-  Pt has insurance, saw Dr Roseanne Reno at [facility] 1 mo ago but never returned - tells me that she understands the importance of follow up now   Code Status: full Family Communication: none Disposition Plan: Transfer to floor-telemetry   Consultants: Cardiology  Procedures: none  Antibiotics: none  DVT prophylaxis: Lovenox  HPI/Subjective: Patient seen this morning- had no complaints- pulse ox was 100% on room air while sitting up in a chair.    Objective: Blood pressure 147/80, pulse 71, temperature 97.8 F (36.6 C), temperature source Oral, resp. rate 20, height 5\' 5"  (1.651 m), weight 74.5 kg (164 lb 3.9 oz), SpO2 99.00%.  Intake/Output Summary (Last 24 hours) at 05/02/13 1728 Last data filed at 05/01/13 2100  Gross per 24 hour  Intake      0 ml  Output   2600 ml  Net  -2600 ml     Exam: General: Alert and oriented x3, No acute respiratory distress Lungs: Minimal crackles bibasilar Cardiovascular: Irregular rate and rhythm without murmur gallop or rub normal S1 and S2, rate controlled Abdomen: Nontender, nondistended, soft, bowel sounds positive, no rebound, no ascites, no appreciable mass Extremities: No significant cyanosis, clubbing, trace pitting edema  Data Reviewed: Basic Metabolic Panel:  Recent Labs Lab 04/29/13 0213 04/29/13 0218 04/29/13 1140 04/29/13 1310 04/30/13 0200 05/01/13 0500 05/01/13  1510 05/02/13 0435  NA 141  --  137  --  136 134*  --  137  K 3.2*  --  2.7*  --  3.1* 4.4  --  3.7  CL 100  --  98  --  99 101  --  101  CO2  --   --  28  --  27 22  --  25  GLUCOSE 149*  --  110*  --  100* 104*  --  80  BUN 19  --  14  --  22 21  --  17  CREATININE 1.10  --  0.75  --  0.99 1.03 0.90 0.89  CALCIUM  --   --  9.0  --  8.8 8.9  --  8.9   MG  --  1.7  --  1.4* 1.4* 2.2  --   --    Liver Function Tests: No results found for this basename: AST, ALT, ALKPHOS, BILITOT, PROT, ALBUMIN,  in the last 168 hours No results found for this basename: LIPASE, AMYLASE,  in the last 168 hours No results found for this basename: AMMONIA,  in the last 168 hours CBC:  Recent Labs Lab 04/29/13 0208  04/29/13 1140 04/30/13 0200 05/01/13 0500 05/01/13 1510 05/02/13 0435  WBC 5.2  --  5.5 5.5 5.5 5.8 6.3  NEUTROABS 2.5  --  3.5  --   --   --   --   HGB 11.9*  < > 11.1* 10.2* 11.2* 10.4* 9.8*  HCT 35.4*  < > 33.0* 30.4* 33.9* 31.4* 29.4*  MCV 73.9*  --  73.5* 74.1* 74.2* 74.6* 74.6*  PLT 192  --  163 153 178 176 147*  < > = values in this interval not displayed. Cardiac Enzymes:  Recent Labs Lab 04/29/13 1310 04/29/13 1900 04/30/13 0200  CKTOTAL 229*  --   --   CKMB 6.6*  --   --   TROPONINI <0.30 <0.30 <0.30   BNP (last 3 results)  Recent Labs  04/29/13 0218  PROBNP 2162.0*   CBG: No results found for this basename: GLUCAP,  in the last 168 hours  Recent Results (from the past 240 hour(s))  MRSA PCR SCREENING     Status: None   Collection Time    04/29/13  5:40 AM      Result Value Range Status   MRSA by PCR NEGATIVE  NEGATIVE Final   Comment:            The GeneXpert MRSA Assay (FDA     approved for NASAL specimens     only), is one component of a     comprehensive MRSA colonization     surveillance program. It is not     intended to diagnose MRSA     infection nor to guide or     monitor treatment for     MRSA infections.     Studies:  Recent x-ray studies have been reviewed in detail by the Attending Physician  Scheduled Meds:  Scheduled Meds: . aspirin EC  81 mg Oral Daily  . diltiazem  240 mg Oral Daily  . furosemide  20 mg Oral Daily  . heparin  5,000 Units Subcutaneous Q8H  . levothyroxine  125 mcg Oral QAC breakfast  . magnesium oxide  400 mg Oral BID  . metoprolol tartrate  25 mg Oral BID   . nitroGLYCERIN  0.4 mg Sublingual Once  . pantoprazole  40 mg Oral Q1200  .  potassium chloride  40 mEq Oral Daily  . ramipril  10 mg Oral BID  . sodium chloride  3 mL Intravenous Q12H  . sodium chloride  3 mL Intravenous Q12H   Continuous Infusions:    Time spent on care of this patient: 25 min   [facility], MD  [facility] Office  [phone] Pager [phone]  On-Call/Text Page:      [email or web address]      password [email or web address]  If 7PM-7AM, please contact night-coverage [email or web address] Password [email or web address] 05/02/2013, 5:28 PM   LOS: 3 days

## 2013-05-02 NOTE — Progress Notes (Signed)
Pt now in junctional rhythm; HR 74; Dr. Butler Denmark aware; cardiology paged; will await callback.

## 2013-05-02 NOTE — Progress Notes (Signed)
Cardiology PA returned page; new orders written; will cont. To monitor.

## 2013-05-02 NOTE — Progress Notes (Signed)
Subjective:  SOB improved.  Objective:  Vital Signs in the last 24 hours: Temp:  [97.7 F (36.5 C)-99.1 F (37.3 C)] 99.1 F (37.3 C) (11/11 0307) Pulse Rate:  [54-86] 74 (11/11 0400) Resp:  [14-39] 27 (11/11 0400) BP: (99-185)/(46-113) 126/59 mmHg (11/11 0400) SpO2:  [94 %-100 %] 94 % (11/11 0400) Weight:  [164 lb 3.9 oz (74.5 kg)] 164 lb 3.9 oz (74.5 kg) (11/11 0500)  Intake/Output from previous day:  Intake/Output Summary (Last 24 hours) at 05/02/13 0803 Last data filed at 05/01/13 2100  Gross per 24 hour  Intake    532 ml  Output   4200 ml  Net  -3668 ml    Physical Exam: General appearance: alert, cooperative, no distress and mildly obese Lungs: decreased breatrh sounds at bases Heart: regular rate and rhythm Extremities: Rt groin withour hematoma   Rate: 80  Rhythm: normal sinus rhythm  Lab Results:  Recent Labs  05/01/13 1510 05/02/13 0435  WBC 5.8 6.3  HGB 10.4* 9.8*  PLT 176 147*    Recent Labs  05/01/13 0500 05/01/13 1510 05/02/13 0435  NA 134*  --  137  K 4.4  --  3.7  CL 101  --  101  CO2 22  --  25  GLUCOSE 104*  --  80  BUN 21  --  17  CREATININE 1.03 0.90 0.89    Recent Labs  04/29/13 1900 04/30/13 0200  TROPONINI <0.30 <0.30    Recent Labs  05/01/13 0500  INR 1.00    Imaging: Imaging results have been reviewed  Cardiac Studies: Echo EF 50-55%  Assessment/Plan:   Principal Problem:   Acute diastolic CHF (congestive heart failure) Active Problems:   Atrial fibrillation with RVR on adm 04/29/13   ACS (acute coronary syndrome)- Normal coronaries 11./10/14   Hypokalemia   HTN (hypertension), malignant   HYPOTHYROIDISM   HYPERLIPIDEMIA   Non compliance with medical treatment    PLAN: Add low dose Lasix daily, watch renal function and K+, continue Lopressor and Diltiazem for now but this may need to be changed later- anemia per primary service. OK to tx to telemetry.   Corine Shelter PA-C Beeper [phone] 05/02/2013,  8:03 AM  Agree with note written by Corine Shelter [facility] Pt admitted with HTN , DDysfunction/CHF and PAF with RVR. Clean cors and nl LV fxn. Diuresed. BP better. On approp. Meds. Lungs clear. I don't feel she will require long term oral A/C. OK to transfer to tele, ambulate today and prob home tomorrow.  Runell Gess 05/02/2013 8:08 AM

## 2013-05-03 ENCOUNTER — Inpatient Hospital Stay (HOSPITAL_COMMUNITY): Payer: Medicare Other

## 2013-05-03 LAB — CBC
HCT: 29 % — ABNORMAL LOW (ref 36.0–46.0)
Hemoglobin: 9.5 g/dL — ABNORMAL LOW (ref 12.0–15.0)
MCH: 24.4 pg — ABNORMAL LOW (ref 26.0–34.0)
MCHC: 32.8 g/dL (ref 30.0–36.0)
MCV: 74.4 fL — ABNORMAL LOW (ref 78.0–100.0)
Platelets: 136 10*3/uL — ABNORMAL LOW (ref 150–400)
RDW: 14.6 % (ref 11.5–15.5)
WBC: 5.4 10*3/uL (ref 4.0–10.5)

## 2013-05-03 LAB — BASIC METABOLIC PANEL
BUN: 14 mg/dL (ref 6–23)
Calcium: 8.7 mg/dL (ref 8.4–10.5)
Chloride: 102 mEq/L (ref 96–112)
Creatinine, Ser: 0.87 mg/dL (ref 0.50–1.10)
GFR calc Af Amer: 79 mL/min — ABNORMAL LOW (ref >90)
GFR calc non Af Amer: 68 mL/min — ABNORMAL LOW (ref >90)
Glucose, Bld: 90 mg/dL (ref 70–99)
Potassium: 4.1 mEq/L (ref 3.5–5.1)

## 2013-05-03 LAB — PRO B NATRIURETIC PEPTIDE: Pro B Natriuretic peptide (BNP): 722.3 pg/mL — ABNORMAL HIGH (ref 0–125)

## 2013-05-03 MED ORDER — METOPROLOL TARTRATE 12.5 MG HALF TABLET: 12.5000 mg | ORAL_TABLET | Freq: Two times a day (BID) | ORAL | Status: DC

## 2013-05-03 MED ORDER — AMLODIPINE BESYLATE 10 MG PO TABS: 5.0000 mg | ORAL_TABLET | Freq: Every day | ORAL | Status: DC

## 2013-05-03 MED ORDER — FUROSEMIDE 20 MG PO TABS: 20.0000 mg | ORAL_TABLET | Freq: Every day | ORAL | Status: DC

## 2013-05-03 MED ORDER — METOPROLOL TARTRATE 25 MG PO TABS: 25.0000 mg | ORAL_TABLET | Freq: Two times a day (BID) | ORAL | Status: DC

## 2013-05-03 MED ORDER — ASPIRIN 81 MG PO TBEC: 81.0000 mg | DELAYED_RELEASE_TABLET | Freq: Every day | ORAL | Status: DC

## 2013-05-03 MED ORDER — PANTOPRAZOLE SODIUM 40 MG PO TBEC: 40.0000 mg | DELAYED_RELEASE_TABLET | Freq: Every day | ORAL | Status: DC

## 2013-05-03 MED ORDER — FERROUS SULFATE 325 (65 FE) MG PO TABS: 325.0000 mg | ORAL_TABLET | Freq: Two times a day (BID) | ORAL | Status: DC

## 2013-05-03 MED ORDER — LEVOTHYROXINE SODIUM 125 MCG PO TABS: 125.0000 ug | ORAL_TABLET | Freq: Every day | ORAL | Status: DC

## 2013-05-03 MED ORDER — POTASSIUM CHLORIDE CRYS ER 20 MEQ PO TBCR: 20.0000 meq | EXTENDED_RELEASE_TABLET | Freq: Every day | ORAL | Status: DC

## 2013-05-03 MED ORDER — RAMIPRIL 10 MG PO CAPS: 10.0000 mg | ORAL_CAPSULE | Freq: Two times a day (BID) | ORAL | Status: DC

## 2013-05-03 NOTE — Progress Notes (Signed)
05/03/2013 12:01 PM Nursing note Cath site care and when to call MD reviewed. Questions and concerns addressed.  Alayah Knouff, Blanchard Kelch

## 2013-05-03 NOTE — Progress Notes (Signed)
05/03/2013 11:14 AM Nursing note Pt. Viewed educational video #102 on heart failure as well as received Living Better with Heart Failure Education packet. Contents reviewed. Questions and concerns addressed.  Yakima Kreitzer, Blanchard Kelch

## 2013-05-03 NOTE — Progress Notes (Signed)
Subjective: No complaints.   Objective: Vital signs in last 24 hours: Temp:  [97.8 F (36.6 C)-98.6 F (37 C)] 97.9 F (36.6 C) (11/12 0635) Pulse Rate:  [64-71] 68 (11/12 0635) Resp:  [18-20] 20 (11/12 0635) BP: (145-165)/(73-85) 153/79 mmHg (11/12 0635) SpO2:  [92 %-100 %] 100 % (11/12 0820) Weight:  [164 lb 1.6 oz (74.435 kg)] 164 lb 1.6 oz (74.435 kg) (11/12 1610) Last BM Date: 05/03/13  Intake/Output from previous day: 11/11 0701 - 11/12 0700 In: 240 [P.O.:240] Out: -  Intake/Output this shift: Total I/O In: 240 [P.O.:240] Out: -   Medications Current Facility-Administered Medications  Medication Dose Route Frequency Provider Last Rate Last Dose  . 0.9 %  sodium chloride infusion  250 mL Intravenous PRN Marykay Lex, MD      . acetaminophen (TYLENOL) tablet 650 mg  650 mg Oral Q4H PRN Therisa Doyne, MD      . aspirin EC tablet 81 mg  81 mg Oral Daily Calvert Cantor, MD   81 mg at 05/03/13 1002  . furosemide (LASIX) tablet 20 mg  20 mg Oral Daily Abelino Derrick, PA-C   20 mg at 05/03/13 1002  . heparin injection 5,000 Units  5,000 Units Subcutaneous Q8H Marykay Lex, MD   5,000 Units at 05/03/13 [phone]  . hydrALAZINE (APRESOLINE) injection 10 mg  10 mg Intravenous Q4H PRN Marykay Lex, MD   10 mg at 05/01/13 1813  . levalbuterol (XOPENEX) nebulizer solution 0.63 mg  0.63 mg Nebulization Q3H PRN Lonia Blood, MD      . levothyroxine (SYNTHROID, LEVOTHROID) tablet 125 mcg  125 mcg Oral QAC breakfast Calvert Cantor, MD   125 mcg at 05/03/13 0657  . magnesium oxide (MAG-OX) tablet 400 mg  400 mg Oral BID Calvert Cantor, MD   400 mg at 05/03/13 1002  . metoprolol tartrate (LOPRESSOR) tablet 12.5 mg  12.5 mg Oral BID Eda Paschal Kilroy, PA-C   12.5 mg at 05/03/13 1003  . nitroGLYCERIN (NITROSTAT) SL tablet 0.4 mg  0.4 mg Sublingual Once Marykay Lex, MD      . ondansetron [facility]) injection 4 mg  4 mg Intravenous Q6H PRN Therisa Doyne, MD      . pantoprazole  (PROTONIX) EC tablet 40 mg  40 mg Oral Q1200 Therisa Doyne, MD   40 mg at 05/02/13 1252  . potassium chloride SA (K-DUR,KLOR-CON) CR tablet 40 mEq  40 mEq Oral Daily Lennette Bihari, MD   40 mEq at 05/03/13 1002  . ramipril (ALTACE) capsule 10 mg  10 mg Oral BID Calvert Cantor, MD   10 mg at 05/03/13 1003  . sodium chloride 0.9 % injection 3 mL  3 mL Intravenous Q12H Therisa Doyne, MD   3 mL at 05/02/13 2302  . sodium chloride 0.9 % injection 3 mL  3 mL Intravenous PRN Therisa Doyne, MD      . sodium chloride 0.9 % injection 3 mL  3 mL Intravenous Q12H Marykay Lex, MD   3 mL at 05/03/13 1005  . sodium chloride 0.9 % injection 3 mL  3 mL Intravenous PRN Marykay Lex, MD        PE: General appearance: alert, cooperative and no distress Lungs: clear to auscultation bilaterally Heart: regular rate and rhythm and 2/6 blowing murmur at the apex Extremities: no LEE Pulses: 2+ and symmetric Skin: warm and dry Neurologic: Grossly normal  Lab Results:   Recent Labs  05/01/13 1510  05/02/13 0435 05/03/13 0455  WBC 5.8 6.3 5.4  HGB 10.4* 9.8* 9.5*  HCT 31.4* 29.4* 29.0*  PLT 176 147* 136*   BMET  Recent Labs  05/01/13 0500 05/01/13 1510 05/02/13 0435 05/03/13 0455  NA 134*  --  137 136  K 4.4  --  3.7 4.1  CL 101  --  101 102  CO2 22  --  25 23  GLUCOSE 104*  --  80 90  BUN 21  --  17 14  CREATININE 1.03 0.90 0.89 0.87  CALCIUM 8.9  --  8.9 8.7   PT/INR  Recent Labs  05/01/13 0500  LABPROT 13.0  INR 1.00    Assessment/Plan    Principal Problem:   Acute diastolic CHF (congestive heart failure) Active Problems:   HYPOTHYROIDISM   HYPERLIPIDEMIA   Atrial fibrillation with RVR on adm 04/29/13   Hypokalemia   HTN (hypertension), malignant   ACS (acute coronary syndrome)- Normal coronaries 11./10/14   Non compliance with medical treatment  Plan: No further chest pain or SOB. NSR on telemetry. HR and BP both stable. Can d/c home today. Will arrange  f/u in 1-2 weeks with APP.    LOS: 4 days    Brittainy M. Delmer Islam 05/03/2013 11:15 AM  Patient seen and examined. Agree with assessment and plan. Maintaining sinus rhythm. For dc as above.   Lennette Bihari, MD, [facility] 05/03/2013 6:39 PM

## 2013-05-03 NOTE — Progress Notes (Signed)
05/03/2013 12:30 PM Nursing note Discharge avs form, medications already taken today and those due this evening explained to patient and daughter. rx given and explained to patient. Follow up appointments and when to call MD also reviewed. D/c iv line. Dc tele. D/c home with daughter per orders.  Joanna Hood, Blanchard Kelch

## 2013-05-03 NOTE — Care Management Note (Signed)
    Page 1 of 1   05/03/2013     4:34:20 PM   CARE MANAGEMENT NOTE 05/03/2013  Patient:  Joanna Hood, Joanna Hood   Account Number:  [id number]  Date Initiated:  04/30/2013  Documentation initiated by:  [facility]  Subjective/Objective Assessment:   adm: Chest pain , SOB     Action/Plan:   discharge planning   Anticipated DC Date:  05/02/2013   Anticipated DC Plan:  HOME W HOME HEALTH SERVICES      DC Planning Services  CM consult      Choice offered to / List presented to:          [facility] arranged  HH-1 RN  HH-10 DISEASE MANAGEMENT      HH agency  [facility]   Status of service:  Completed, signed off Medicare Important Message given?   (If response is "NO", the following Medicare IM given date fields will be blank) Date Medicare IM given:   Date Additional Medicare IM given:    Discharge Disposition:  HOME W HOME HEALTH SERVICES  Per UR Regulation:  Reviewed for med. necessity/level of care/duration of stay  If discussed at Long Length of Stay Meetings, dates discussed:    Comments:  05/03/13 Mykel Mohl,RN,BSN [phone] PT FOR DC HOME TODAY.  NOTIFIED [facility] OF DC DATE.  04/30/13 09:00 CM met pt in room to give pt PCP Resource List.  Cm also offered choice for HHRN.  Pt chose [facility]. Referral faxed to [facility]  possible PT/OT add on as determined by PT/OT eval.  Address and contact numbers verified.  Will continue to monitor for discharge needs. Freddy Jaksch, BSN, CM [phone].

## 2013-05-03 NOTE — Progress Notes (Signed)
Patient began feeling short of breath this morning following a coughing spell.  Placed 2L O2 for comfort.  Lungs clear to auscultation and sats are 100% on 2L.  Will continue to monitor.  Arva Chafe

## 2013-05-03 NOTE — Discharge Summary (Signed)
Physician Discharge Summary  Grazia Taffe ZOX:[id number] DOB: March 09, 1946 DOA: 04/29/2013  PCP: No PCP Per Patient  Admit date: 04/29/2013 Discharge date: 05/03/2013  Time spent: >35 minutes  Recommendations for Outpatient Follow-up:  F/u with PCP in 1 week, need BMP next week  F/u with cardiology in 1-2 week  Discharge Diagnoses:  Principal Problem:   Acute diastolic CHF (congestive heart failure) Active Problems:   HYPOTHYROIDISM   HYPERLIPIDEMIA   Atrial fibrillation with RVR on adm 04/29/13   Hypokalemia   HTN (hypertension), malignant   ACS (acute coronary syndrome)- Normal coronaries 11./10/14   Non compliance with medical treatment   Discharge Condition: stable   Diet recommendation: heart healthy   Filed Weights   05/01/13 0500 05/02/13 0500 05/03/13 0635  Weight: 77.565 kg (171 lb) 74.5 kg (164 lb 3.9 oz) 74.435 kg (164 lb 1.6 oz)    History of present illness:  67 y/o wih hypothyroidism and HTN who saw dr Roseanne Reno as an outpt 1 month ago and was given prescriptions for Norvasc and Lasix. She did not return and ran out of medications over 1 wk ago. She presented with 1 week hx of worsening shortness of breath and trouble laying down flat. She has been out of her meds for the past 1 month.  -she found in ER with a.fib w RVR HR in 140's currently HR down to 72 on diltiazem gtt her blood pressure was noted to be elevated in 180's.  -After receiving lasix and being started on nitro gtt patient has diuresed and is feeling better.  Hospital Course:  1. Acute respiratory failure/ Pulmonary edema/CHF/improved on diuresis  -s/p LHC: no significant stenosis  - EF WNL therefore cause is likely A-fib with RVR  - cont PO lasix, added ACE, BB Kcl'; f/u BMP next week    2. Abnormal EKG inf/ lat T wave inversions/A fib RVR;  - Cath clean vessels. Heart failure is from poorly controlled hypertension and medical noncompliance  - started on increased BB, cont ASA; HR  controlled; per cardiology no indication for long term anticoagulation  3. Hypothyroidism, increased Synthroid; recheck tsh in 4 week   4. HTN (hypertension), malignant; improved: increased ACE dose and Metoprolol, resume norvasc;   5. Anemia; no s/s of acute bleeding; start PO iron, outpatient follow up, may need colonoscopy   Pt has insurance, saw Dr Roseanne Reno at [facility] 1 mo ago but never returned  - tells me that she understands the importance of follow up now      Procedures:  LHC:  (i.e. Studies not automatically included, echos, thoracentesis, etc; not x-rays)  Consultations:  Cardiology   Discharge Exam: Filed Vitals:   05/03/13 0635  BP: 153/79  Pulse: 68  Temp: 97.9 F (36.6 C)  Resp: 20    General: alert Cardiovascular: s1,s2 rrr Respiratory: CTA BL   Discharge Instructions  Discharge Orders   Future Orders Complete By Expires   Diet - low sodium heart healthy  As directed    Discharge instructions  As directed    Comments:     Follow up with primary care physician in 1 week   Increase activity slowly  As directed        Medication List         amLODipine 10 MG tablet  Commonly known as:  NORVASC  Take 0.5 tablets (5 mg total) by mouth daily.     aspirin 81 MG EC tablet  Take 1 tablet (81 mg  total) by mouth daily.     ferrous sulfate 325 (65 FE) MG tablet  Take 1 tablet (325 mg total) by mouth 2 (two) times daily with a meal.     furosemide 20 MG tablet  Commonly known as:  LASIX  Take 1 tablet (20 mg total) by mouth daily.     levothyroxine 125 MCG tablet  Commonly known as:  SYNTHROID, LEVOTHROID  Take 1 tablet (125 mcg total) by mouth daily before breakfast.     metoprolol tartrate 25 MG tablet  Commonly known as:  LOPRESSOR  Take 1 tablet (25 mg total) by mouth 2 (two) times daily.     pantoprazole 40 MG tablet  Commonly known as:  PROTONIX  Take 1 tablet (40 mg total) by mouth daily at 12 noon.     potassium  chloride SA 20 MEQ tablet  Commonly known as:  K-DUR,KLOR-CON  Take 1 tablet (20 mEq total) by mouth daily.     ramipril 10 MG capsule  Commonly known as:  ALTACE  Take 1 capsule (10 mg total) by mouth 2 (two) times daily.       Allergies  Allergen Reactions  . Metronidazole     REACTION: Rash, itching       Follow-up Information   Follow up with Runell Gess, MD In 2 weeks.   Specialty:  Cardiology   Contact information:   [address] [place] [address] [phone]        The results of significant diagnostics from this hospitalization (including imaging, microbiology, ancillary and laboratory) are listed below for reference.    Significant Diagnostic Studies: Dg Chest 2 View  05/03/2013   CLINICAL DATA:  Crackles at the lung bases.  Pulmonary edema.  EXAM: CHEST  2 VIEW  COMPARISON:  04/29/2013  FINDINGS: There is borderline cardiomegaly. Pulmonary vascularity is at the upper limits of normal, improved. Pulmonary edema has almost completely resolved with some minimal residual haziness on the left. There small bilateral pleural effusions. No acute osseous abnormality.  IMPRESSION: Almost complete resolution of congestive heart failure.   Electronically Signed   By: Geanie Cooley M.D.   On: 05/03/2013 08:14   Dg Chest 2 View  04/29/2013   CLINICAL DATA:  Shortness of breath.  EXAM: CHEST  2 VIEW  COMPARISON:  None.  FINDINGS: The lungs are well-aerated. Vascular congestion is noted, with bilateral airspace opacification and small bilateral pleural effusions, compatible with pulmonary edema. No pneumothorax is seen.  The heart is mildly enlarged. No acute osseous abnormalities are seen.  IMPRESSION: Vascular congestion and mild cardiomegaly, with bilateral airspace opacification and small bilateral pleural effusions, compatible with pulmonary edema.   Electronically Signed   By: Roanna Raider M.D.   On: 04/29/2013 02:46    Microbiology: Recent Results  (from the past 240 hour(s))  MRSA PCR SCREENING     Status: None   Collection Time    04/29/13  5:40 AM      Result Value Range Status   MRSA by PCR NEGATIVE  NEGATIVE Final   Comment:            The GeneXpert MRSA Assay (FDA     approved for NASAL specimens     only), is one component of a     comprehensive MRSA colonization     surveillance program. It is not     intended to diagnose MRSA     infection nor to guide or  monitor treatment for     MRSA infections.     Labs: Basic Metabolic Panel:  Recent Labs Lab 04/29/13 0213 04/29/13 0218 04/29/13 1140 04/29/13 1310 04/30/13 0200 05/01/13 0500 05/01/13 1510 05/02/13 0435 05/03/13 0455  NA 141  --  137  --  136 134*  --  137 136  K 3.2*  --  2.7*  --  3.1* 4.4  --  3.7 4.1  CL 100  --  98  --  99 101  --  101 102  CO2  --   --  28  --  27 22  --  25 23  GLUCOSE 149*  --  110*  --  100* 104*  --  80 90  BUN 19  --  14  --  22 21  --  17 14  CREATININE 1.10  --  0.75  --  0.99 1.03 0.90 0.89 0.87  CALCIUM  --   --  9.0  --  8.8 8.9  --  8.9 8.7  MG  --  1.7  --  1.4* 1.4* 2.2  --   --   --    Liver Function Tests: No results found for this basename: AST, ALT, ALKPHOS, BILITOT, PROT, ALBUMIN,  in the last 168 hours No results found for this basename: LIPASE, AMYLASE,  in the last 168 hours No results found for this basename: AMMONIA,  in the last 168 hours CBC:  Recent Labs Lab 04/29/13 0208  04/29/13 1140 04/30/13 0200 05/01/13 0500 05/01/13 1510 05/02/13 0435 05/03/13 0455  WBC 5.2  --  5.5 5.5 5.5 5.8 6.3 5.4  NEUTROABS 2.5  --  3.5  --   --   --   --   --   HGB 11.9*  < > 11.1* 10.2* 11.2* 10.4* 9.8* 9.5*  HCT 35.4*  < > 33.0* 30.4* 33.9* 31.4* 29.4* 29.0*  MCV 73.9*  --  73.5* 74.1* 74.2* 74.6* 74.6* 74.4*  PLT 192  --  163 153 178 176 147* 136*  < > = values in this interval not displayed. Cardiac Enzymes:  Recent Labs Lab 04/29/13 1310 04/29/13 1900 04/30/13 0200  CKTOTAL 229*  --   --    CKMB 6.6*  --   --   TROPONINI <0.30 <0.30 <0.30   BNP: BNP (last 3 results)  Recent Labs  04/29/13 0218 05/03/13 0455  PROBNP 2162.0* 722.3*   CBG: No results found for this basename: GLUCAP,  in the last 168 hours     Signed:  Esperanza Sheets  [facility] 05/03/2013, 10:48 AM

## 2013-05-16 ENCOUNTER — Ambulatory Visit (INDEPENDENT_AMBULATORY_CARE_PROVIDER_SITE_OTHER): Payer: Medicare Other | Admitting: Cardiology

## 2013-05-16 ENCOUNTER — Encounter: Payer: Self-pay | Admitting: Cardiology

## 2013-05-16 VITALS — BP 144/92 | HR 65 | Ht 65.0 in | Wt 165.1 lb

## 2013-05-16 DIAGNOSIS — I4891 Unspecified atrial fibrillation: Secondary | ICD-10-CM

## 2013-05-16 DIAGNOSIS — I10 Essential (primary) hypertension: Secondary | ICD-10-CM

## 2013-05-16 NOTE — Patient Instructions (Signed)
Continue medications as directed to keep blood pressure under control Keep your already scheduled doctors appointment with [facility] in 2 weeks Continue to work on better diet and increase exercise. I recommend walking 30 minutes at least 3 times a week Follow-up in 6 months

## 2013-05-16 NOTE — Assessment & Plan Note (Signed)
Moderately controlled at today's visit. SBP in the 140s. She reports compliance with her medications. She is working on improving her diet and has decreased her sodium intake. We discussed purchasing a home BP monitor for her to keep regular check of her BP. She established a new PCP appoitment at [facility] in 2 weeks. They will also keep check on BP. Will continue on CCB, BB and Lasix.

## 2013-05-16 NOTE — Progress Notes (Signed)
05/16/2013 Joanna Hood   1946-02-28  [id number]  Primary Physicia No PCP Per Patient Primary Cardiologist: Dr. Allyson Hood  HPI:  The patient is a 67 y/o female, with a history of HTN and hypothyroidism, who was admitted to [facility] on 04/29/13 for new onset atrial fibrillation w/ RVR. Her w/u revealed normal coronaries on diagnostic cath. It was felt that her atrial fibrillation was triggered by acute diastolic HF, secondary to severe hypertension. Her afib was treated with IV Cardizem and she spontaneously converted back to NSR. Her BP improved with the addition of Lopressor, amlodipine and Altace. She was discharged home on 05/03/13.  She presents to clinic today for post-hospital follow-up. Since discharge, she has had no issues. She denies any symptoms concerning for atrial fibrillation, including chest pain, SOB and palpiations. She reports compliance with her medications and has adopted a low sodium diet.     Current Outpatient Prescriptions  Medication Sig Dispense Refill  . amLODipine (NORVASC) 10 MG tablet Take 0.5 tablets (5 mg total) by mouth daily.  30 tablet  0  . aspirin EC 81 MG EC tablet Take 1 tablet (81 mg total) by mouth daily.      . ferrous sulfate 325 (65 FE) MG tablet Take 1 tablet (325 mg total) by mouth 2 (two) times daily with a meal.  90 tablet  3  . furosemide (LASIX) 20 MG tablet Take 1 tablet (20 mg total) by mouth daily.  30 tablet  0  . levothyroxine (SYNTHROID, LEVOTHROID) 125 MCG tablet Take 1 tablet (125 mcg total) by mouth daily before breakfast.  30 tablet  0  . metoprolol tartrate (LOPRESSOR) 25 MG tablet Take 1 tablet (25 mg total) by mouth 2 (two) times daily.  60 tablet  1  . pantoprazole (PROTONIX) 40 MG tablet Take 1 tablet (40 mg total) by mouth daily at 12 noon.      . potassium chloride SA (K-DUR,KLOR-CON) 20 MEQ tablet Take 1 tablet (20 mEq total) by mouth daily.  30 tablet  0  . ramipril (ALTACE) 10 MG capsule Take 1 capsule (10 mg total) by mouth  2 (two) times daily.  60 capsule  0   No current facility-administered medications for this visit.    Allergies  Allergen Reactions  . Metronidazole     REACTION: Rash, itching    History   Social History  . Marital Status: Single    Spouse Name: N/A    Number of Children: N/A  . Years of Education: N/A   Occupational History  . Not on file.   Social History Main Topics  . Smoking status: Former Smoker    Quit date: 05/16/1969  . Smokeless tobacco: Not on file  . Alcohol Use: No  . Drug Use: No  . Sexual Activity: Not on file   Other Topics Concern  . Not on file   Social History Narrative  . No narrative on file     Review of Systems: General: negative for chills, fever, night sweats or weight changes.  Cardiovascular: negative for chest pain, dyspnea on exertion, edema, orthopnea, palpitations, paroxysmal nocturnal dyspnea or shortness of breath Dermatological: negative for rash Respiratory: negative for cough or wheezing Urologic: negative for hematuria Abdominal: negative for nausea, vomiting, diarrhea, bright red blood per rectum, melena, or hematemesis Neurologic: negative for visual changes, syncope, or dizziness All other systems reviewed and are otherwise negative except as noted above.    Blood pressure 144/92, pulse 65, height 5\' 5"  (1.651  m), weight 165 lb 1.6 oz (74.889 kg).  General appearance: alert, cooperative and no distress Neck: no carotid bruit and no JVD Lungs: clear to auscultation bilaterally Heart: regular rate and rhythm and 2/6 murmur Extremities: no LEE Pulses: 2+ and symmetric Skin: warm and dry Neurologic: Grossly normal  EKG NSR. HR 65 bmp  ASSESSMENT AND PLAN:   Atrial fibrillation with RVR on adm 04/29/13 Resolved. NSR on today's office EKG. She denies any palpations, SOB, chest pain, lightheadedness/dizziness, syncope/near syncope since being discharged. Will continue on BB.  HTN (hypertension), malignant Moderately  controlled at today's visit. SBP in the 140s. She reports compliance with her medications. She is working on improving her diet and has decreased her sodium intake. We discussed purchasing a home BP monitor for her to keep regular check of her BP. She established a new PCP appoitment at [facility] in 2 weeks. They will also keep check on BP. Will continue on CCB, BB and Lasix.    PLAN  Joanna Hood returns for post-hospital f/u after an admission for new onset atrial fibrillation w/ RVR, in the setting of acute on chronic diastolic heart failure and severe hypertension. Atrial fibrillation has resolved. EKG shows NSR. No symptoms of an irregular heartbeat since discharge. Her ischemic w/u was negative by cath. Her BP is decently controlled with SBP in the mid 140s. We will continue her on her current dose of BB, CBC and ACE-I. She will also f/u with a new PCP in 2 weeks, who will also help manage her BP. I have instructed her to follow-up in 6 months.   [name] 05/16/2013 5:31 PM

## 2013-05-16 NOTE — Assessment & Plan Note (Signed)
Resolved. NSR on today's office EKG. She denies any palpations, SOB, chest pain, lightheadedness/dizziness, syncope/near syncope since being discharged. Will continue on BB.

## 2013-05-17 ENCOUNTER — Encounter: Payer: Self-pay | Admitting: Cardiology

## 2013-05-29 ENCOUNTER — Other Ambulatory Visit: Payer: Self-pay | Admitting: Cardiology

## 2013-05-29 NOTE — Telephone Encounter (Signed)
Rx was sent to pharmacy electronically. 

## 2013-06-17 ENCOUNTER — Other Ambulatory Visit: Payer: Self-pay | Admitting: Cardiology

## 2013-06-19 NOTE — Telephone Encounter (Signed)
Rx was sent to pharmacy electronically. 

## 2013-08-17 ENCOUNTER — Ambulatory Visit: Payer: Medicare Other | Admitting: Family Medicine

## 2013-08-27 ENCOUNTER — Encounter: Payer: Self-pay | Admitting: Family Medicine

## 2013-09-01 ENCOUNTER — Ambulatory Visit (INDEPENDENT_AMBULATORY_CARE_PROVIDER_SITE_OTHER): Payer: Commercial Managed Care - HMO | Admitting: Family Medicine

## 2013-09-01 ENCOUNTER — Encounter: Payer: Self-pay | Admitting: Family Medicine

## 2013-09-01 VITALS — BP 170/110 | HR 60 | Temp 97.9°F | Ht 63.0 in | Wt 168.8 lb

## 2013-09-01 DIAGNOSIS — I480 Paroxysmal atrial fibrillation: Secondary | ICD-10-CM

## 2013-09-01 DIAGNOSIS — E039 Hypothyroidism, unspecified: Secondary | ICD-10-CM

## 2013-09-01 DIAGNOSIS — E785 Hyperlipidemia, unspecified: Secondary | ICD-10-CM

## 2013-09-01 DIAGNOSIS — Z862 Personal history of diseases of the blood and blood-forming organs and certain disorders involving the immune mechanism: Secondary | ICD-10-CM

## 2013-09-01 DIAGNOSIS — I499 Cardiac arrhythmia, unspecified: Secondary | ICD-10-CM

## 2013-09-01 DIAGNOSIS — I4891 Unspecified atrial fibrillation: Secondary | ICD-10-CM

## 2013-09-01 DIAGNOSIS — I509 Heart failure, unspecified: Secondary | ICD-10-CM

## 2013-09-01 DIAGNOSIS — I5031 Acute diastolic (congestive) heart failure: Secondary | ICD-10-CM

## 2013-09-01 DIAGNOSIS — I10 Essential (primary) hypertension: Secondary | ICD-10-CM

## 2013-09-01 LAB — CBC WITH DIFFERENTIAL/PLATELET
Basophils Absolute: 0 10*3/uL (ref 0.0–0.1)
Basophils Relative: 1 % (ref 0–1)
EOS PCT: 1 % (ref 0–5)
Eosinophils Absolute: 0 10*3/uL (ref 0.0–0.7)
HEMATOCRIT: 36.4 % (ref 36.0–46.0)
Hemoglobin: 12 g/dL (ref 12.0–15.0)
LYMPHS PCT: 36 % (ref 12–46)
Lymphs Abs: 1.5 10*3/uL (ref 0.7–4.0)
MCH: 24 pg — ABNORMAL LOW (ref 26.0–34.0)
MCHC: 33 g/dL (ref 30.0–36.0)
MCV: 72.9 fL — ABNORMAL LOW (ref 78.0–100.0)
Monocytes Absolute: 0.5 10*3/uL (ref 0.1–1.0)
Monocytes Relative: 11 % (ref 3–12)
NEUTROS ABS: 2.1 10*3/uL (ref 1.7–7.7)
Neutrophils Relative %: 51 % (ref 43–77)
PLATELETS: 193 10*3/uL (ref 150–400)
RBC: 4.99 MIL/uL (ref 3.87–5.11)
RDW: 15.1 % (ref 11.5–15.5)
WBC: 4.2 10*3/uL (ref 4.0–10.5)

## 2013-09-01 MED ORDER — METOPROLOL TARTRATE 25 MG PO TABS: 12.5000 mg | ORAL_TABLET | Freq: Two times a day (BID) | ORAL | Status: DC

## 2013-09-01 MED ORDER — LEVOTHYROXINE SODIUM 125 MCG PO TABS: ORAL_TABLET | ORAL | Status: DC

## 2013-09-01 MED ORDER — AMLODIPINE BESYLATE 10 MG PO TABS: 10.0000 mg | ORAL_TABLET | Freq: Every day | ORAL | Status: DC

## 2013-09-01 NOTE — Assessment & Plan Note (Signed)
Unclear what specific deficiency. Check CBC again today along with iron panel and B12.

## 2013-09-01 NOTE — Assessment & Plan Note (Signed)
Anticipate largely related to nonadherence to meds - states out of several meds for last few weeks. rec restart meds - will restart amlodipine 10mg  daily, and metoprolol at lower dose given bradycardia noted on exam today. Continue ramipril and lasix.

## 2013-09-01 NOTE — Assessment & Plan Note (Signed)
Has been out of synthroid in last few weeks. Advised restart - 1/2 tab for 1 wk then increase to one pill daily. Anticipate contributing to current atrial fibrillation

## 2013-09-01 NOTE — Progress Notes (Signed)
BP 170/110  Pulse 60  Temp(Src) 97.9 F (36.6 C) (Oral)  Ht 5\' 3"  (1.6 m)  Wt 168 lb 12.8 oz (76.567 kg)  BMI 29.91 kg/m2   CC: new pt to establish  Subjective:    Patient ID: Joanna Hood, female    DOB: 1946/01/27, 68 y.o.   MRN: [id number]  HPI: Joanna Hood is a 68 y.o. female presenting on 09/01/2013 for Establish Care   States prior records were already faxed here but I have not received them.  Prior seen by Dr. Sheryle Hail.  H/o hypertension, hypothyroidism, chronic diastolic heart failure due to hypertension with recent new onset atrial fibrillation w RVR leading to hospitalization for dyspnea.  At that time afib thought to be due to acute diastolic CHF exacerbation from uncontrolled HTN.  Saw Dr. Gwenlyn Found in f/u.  States cardiac catheterization was normal.  Last 1-2 weeks ago she ran out of metoprolol, ferrous sulfate, levothyroxine.  HTN - dx at age 10 - on amlodipine 5mg  daily, lasix 20mg  daily, metoprolol 25mg  bid, and ramipril 10mg  bid.  However not fully compliant with meds and ran out of several 1-2 wks ago.  No HA, vision changes, CP/tightness, SOB, leg swelling.  No longer checking blood pressures at home.  Hypothyroidism - usually takes levothyroxine 119mcg daily but out of this med for the last "1-2" weeks.  Recently found to be anemic- ran out of iron last week.  Noticing worsening left knee pain for last 2-3 weeks.  Preventative: Last CPE end of 2013 Medicare started 2013 (>1 yr ago) Colonoscopy ~2008 WNL per patient but has h/o colon polyps Flu shot - 2014 Pneumovax 04/2013  Td 2005   Relevant past medical, surgical, family and social history reviewed and updated as indicated.  Allergies and medications reviewed and updated. Current Outpatient Prescriptions on File Prior to Visit  Medication Sig  . aspirin EC 81 MG EC tablet Take 1 tablet (81 mg total) by mouth daily.  . ferrous sulfate 325 (65 FE) MG tablet Take 1 tablet (325 mg total) by  mouth 2 (two) times daily with a meal.  . furosemide (LASIX) 20 MG tablet TAKE ONE TABLET BY MOUTH EVERY MORNING  . pantoprazole (PROTONIX) 40 MG tablet TAKE 1 TABLET BY MOUTH ONCE DAILY AT NOON  . potassium chloride SA (K-DUR,KLOR-CON) 20 MEQ tablet TAKE 1 TABLET BY MOUTH DAILY  . ramipril (ALTACE) 10 MG capsule TAKE 1 CAPSULE BY MOUTH TWICE DAILY   No current facility-administered medications on file prior to visit.    Review of Systems Per HPI unless specifically indicated above    Objective:    BP 170/110  Pulse 60  Temp(Src) 97.9 F (36.6 C) (Oral)  Ht 5\' 3"  (1.6 m)  Wt 168 lb 12.8 oz (76.567 kg)  BMI 29.91 kg/m2  Physical Exam  Nursing note and vitals reviewed. Constitutional: She is oriented to person, place, and time. She appears well-developed and well-nourished. No distress.  HENT:  Head: Normocephalic and atraumatic.  Right Ear: Hearing, tympanic membrane, external ear and ear canal normal.  Left Ear: Hearing, tympanic membrane, external ear and ear canal normal.  Nose: Nose normal.  Mouth/Throat: Uvula is midline, oropharynx is clear and moist and mucous membranes are normal. No oropharyngeal exudate, posterior oropharyngeal edema or posterior oropharyngeal erythema.  Eyes: Conjunctivae and EOM are normal. Pupils are equal, round, and reactive to light. No scleral icterus.  Neck: Normal range of motion. Neck supple. Carotid bruit is not present. No  thyromegaly present.  Cardiovascular: Normal rate and intact distal pulses.  An irregularly irregular rhythm present.  Murmur (3/6 SEM best at apex) heard. Pulses:      Radial pulses are 2+ on the right side, and 2+ on the left side.  Pulmonary/Chest: Effort normal and breath sounds normal. No respiratory distress. She has no wheezes. She has no rales.  Musculoskeletal: Normal range of motion. She exhibits no edema.  Lymphadenopathy:    She has no cervical adenopathy.  Neurological: She is alert and oriented to person,  place, and time.  CN grossly intact, station and gait intact  Skin: Skin is warm and dry. No rash noted.  Psychiatric: She has a normal mood and affect. Her behavior is normal. Judgment and thought content normal.       Assessment & Plan:   Problem List Items Addressed This Visit   Acute diastolic CHF (congestive heart failure)   Relevant Medications      amLODIpine (NORVASC) tablet      metoprolol tartrate (LOPRESSOR) tablet   ANEMIA, DEFICIENCY, HX OF     Unclear what specific deficiency. Check CBC again today along with iron panel and B12.    Relevant Orders      CBC with Differential      IBC panel      Vitamin B12      Ferritin   HTN (hypertension), malignant     Anticipate largely related to nonadherence to meds - states out of several meds for last few weeks. rec restart meds - will restart amlodipine 10mg  daily, and metoprolol at lower dose given bradycardia noted on exam today. Continue ramipril and lasix.    HYPERLIPIDEMIA     H/o this, per pt no longer on chol med. Will need FLP next fasting blood work.    HYPOTHYROIDISM - Primary     Has been out of synthroid in last few weeks. Advised restart - 1/2 tab for 1 wk then increase to one pill daily. Anticipate contributing to current atrial fibrillation    Relevant Medications      levothyroxine (SYNTHROID, LEVOTHROID) tablet   Other Relevant Orders      TSH      T4, Free   Paroxysmal atrial fibrillation     Irregular heart rhythm today with some coupling noted. Suspect afib return from noncomplaince with med regimen (leading to hypertension and hypothyroidism). Check EKG today - if truly afib, will recommend return to see Dr. Gwenlyn Found to discuss anticoagulant.  In interim, rec increase aspirin to 162mg  daily.    Relevant Orders      TSH      Basic metabolic panel      CBC with Differential    Other Visit Diagnoses   Irregular heart beat        Relevant Orders       EKG 12-Lead        Follow up  plan: Return in about 2 months (around 11/01/2013), or as needed, for follow up.

## 2013-09-01 NOTE — Assessment & Plan Note (Signed)
H/o this, per pt no longer on chol med. Will need FLP next fasting blood work.

## 2013-09-01 NOTE — Patient Instructions (Addendum)
Let's increase amlodipine to 10mg  daily - watch for ankle swelling with this increase.  Restart metoprolol 12.5mg  twice daily. Restart levothyroxine (1/2 tablet for 1 week then increase to 1 tablet daily). Blood work today. EKG today. I recommend increasing aspirin to 162mg  daily (2 tablets daily) until you see Dr. Gwenlyn Found - call him to schedule follow up appointment. If you're running low on medications, please let us know to refill them in a timely manner. Return to see me in 2 months for medicare wellness visit.

## 2013-09-01 NOTE — Assessment & Plan Note (Addendum)
Irregular heart rhythm today with some coupling noted. Suspect afib return from noncomplaince with med regimen (leading to hypertension and hypothyroidism). Check EKG today - if truly afib, will recommend return to see Dr. Gwenlyn Found to discuss anticoagulant.  In interim, rec increase aspirin to 162mg  daily. Rate controlled today. EKG today - atrial fibrillation at rate ~70s, normal axis, intervals, nonspecific ST/T changes overall unchanged from 04/2013 EKG

## 2013-09-01 NOTE — Progress Notes (Signed)
Pre visit review using our clinic review tool, if applicable. No additional management support is needed unless otherwise documented below in the visit note. 

## 2013-09-02 ENCOUNTER — Encounter: Payer: Self-pay | Admitting: Family Medicine

## 2013-09-02 ENCOUNTER — Other Ambulatory Visit: Payer: Self-pay | Admitting: Family Medicine

## 2013-09-02 DIAGNOSIS — R718 Other abnormality of red blood cells: Secondary | ICD-10-CM

## 2013-09-02 DIAGNOSIS — D509 Iron deficiency anemia, unspecified: Secondary | ICD-10-CM | POA: Insufficient documentation

## 2013-09-02 DIAGNOSIS — D638 Anemia in other chronic diseases classified elsewhere: Secondary | ICD-10-CM | POA: Insufficient documentation

## 2013-09-02 LAB — BASIC METABOLIC PANEL WITH GFR
BUN: 13 mg/dL (ref 6–23)
CO2: 28 meq/L (ref 19–32)
Calcium: 9.2 mg/dL (ref 8.4–10.5)
Chloride: 102 meq/L (ref 96–112)
Creat: 0.81 mg/dL (ref 0.50–1.10)
Glucose, Bld: 85 mg/dL (ref 70–99)
Potassium: 3.6 meq/L (ref 3.5–5.3)
Sodium: 140 meq/L (ref 135–145)

## 2013-09-02 LAB — IBC PANEL
%SAT: 24 % (ref 20–55)
TIBC: 308 ug/dL (ref 250–470)
UIBC: 235 ug/dL (ref 125–400)

## 2013-09-02 LAB — VITAMIN B12: Vitamin B-12: 505 pg/mL (ref 211–911)

## 2013-09-02 LAB — TSH: TSH: 11.023 u[IU]/mL — ABNORMAL HIGH (ref 0.350–4.500)

## 2013-09-02 LAB — IRON: Iron: 73 ug/dL (ref 42–145)

## 2013-09-02 LAB — T4, FREE: Free T4: 1.36 ng/dL (ref 0.80–1.80)

## 2013-09-02 LAB — FERRITIN: Ferritin: 331 ng/mL — ABNORMAL HIGH (ref 10–291)

## 2013-09-04 ENCOUNTER — Telehealth: Payer: Self-pay | Admitting: Family Medicine

## 2013-09-04 NOTE — Telephone Encounter (Signed)
Relevant patient education mailed to patient.  

## 2013-09-05 ENCOUNTER — Telehealth: Payer: Self-pay | Admitting: Family Medicine

## 2013-09-05 ENCOUNTER — Encounter: Payer: Self-pay | Admitting: Family Medicine

## 2013-09-05 NOTE — Telephone Encounter (Signed)
CHADSVASC score = 4 Spoke with patient and discussed different anticoagulant options. Pt to see Dr. Gwenlyn Found next Wednesday - and declines anticoagulation currently, states she will discuss with Dr. Kennon Holter PA at next appt next week.   Aware of stroke risk off anticoagulant, reviewed stroke sxs as well as worsening CHF sxs and discussed need to seek emergent care if any new sxs. Advised to start monitoring blood pressure regularly with her home bp cuff.

## 2013-09-08 ENCOUNTER — Ambulatory Visit: Payer: Medicare Other | Admitting: Family Medicine

## 2013-09-13 ENCOUNTER — Ambulatory Visit (INDEPENDENT_AMBULATORY_CARE_PROVIDER_SITE_OTHER): Payer: Commercial Managed Care - HMO | Admitting: Cardiology

## 2013-09-13 VITALS — BP 150/92 | HR 87 | Ht 63.0 in | Wt 170.7 lb

## 2013-09-13 DIAGNOSIS — I480 Paroxysmal atrial fibrillation: Secondary | ICD-10-CM

## 2013-09-13 DIAGNOSIS — I10 Essential (primary) hypertension: Secondary | ICD-10-CM

## 2013-09-13 DIAGNOSIS — E039 Hypothyroidism, unspecified: Secondary | ICD-10-CM

## 2013-09-13 DIAGNOSIS — I4891 Unspecified atrial fibrillation: Secondary | ICD-10-CM

## 2013-09-13 MED ORDER — METOPROLOL TARTRATE 25 MG PO TABS: 25.0000 mg | ORAL_TABLET | Freq: Two times a day (BID) | ORAL | Status: DC

## 2013-09-13 MED ORDER — APIXABAN 5 MG PO TABS: 5.0000 mg | ORAL_TABLET | Freq: Two times a day (BID) | ORAL | Status: DC

## 2013-09-13 NOTE — Patient Instructions (Addendum)
Your physician recommends that you schedule a follow-up appointment in: 4 weeks  Your physician has recommended you make the following change in your medication: Start Eliquis 5 mg twice daily and increase metoprolol tart to 25 mg twice a day

## 2013-09-17 ENCOUNTER — Encounter: Payer: Self-pay | Admitting: Cardiology

## 2013-09-17 NOTE — Assessment & Plan Note (Signed)
EKG today demonstrates Afib w/ a controlled ventricular response. She is asymptomatic. Her HR is in the upper 80s. BP is moderately elevated at 150/92. We will increase her Lopressor from 12.5 mg to 25 mg BID. She has a CHA2DS2-VASc score of 4 (CHF, HTN, Age, Sex), placing her at high risk for thromboembolic event. She denies any history of bleeding disorders, PUD and is low risk for falls. I have consulted with our office pharmacist and we have elected to place her on Eliquis , 5 mg BID for stroke prophylaxis.

## 2013-09-17 NOTE — Assessment & Plan Note (Deleted)
Recent assessment of thyroid function, revealed a TSH level consistent with hypothyroid and not hyperthyroid. Continue management per PCP.

## 2013-09-17 NOTE — Assessment & Plan Note (Signed)
Moderately elevated today at 150/92. She denies CP/SOB and HA. No signs/symptoms of acute diastolic HF. Will increase Lopressor to 25 mg BID.

## 2013-09-17 NOTE — Progress Notes (Signed)
Patient ID: Joanna Hood, female   DOB: [birth year], 68 y.o.   MRN: [id number]    09/17/2013 Joanna Hood   1945/09/26  [id number]  Primary [place], [place] Primary Cardiologist: Dr. Gwenlyn Found  HPI:  Joanna Hood is a 68 y/o AAF, followed by [facility] since November 2014 when she was admitted for acute diastolic heart failure, secondary to severe hypertension, complicated by Afib w/ RVR. During that admission, w/u revealed normal coronaries on diagnostic cath and normal LV systolic function with an EF 50-55%. She ultimately spontaneously converted to NSR on IV Cardizem. She was discharged home on Lopressor. At her 2 week post hospital follow up visit, she was doing well and an office EKG demonstrated that she was in NSR. She denied any further symptoms of SOB and palpitations at that time. Her other past medical history is significant for HTN and hypothyroidism, treated with levothyroxine. Recent thyroid testing 1 week ago revealed and elevated TSH of 11.023. Her levothyroxine was adjusted by her PCP.   She was seen last week by her PCP, Dr. Danise Hood, for routein follow up and was noted to be in atrial fibrillation, by EKG. She was instructed to follow up in our office. She presents today and an EKG in our office confirms that she is in Afib with a controlled ventricular response. She denies any awareness. No palpitations, CP, SOB or dizziness. She reports compliance with her medications. She denies any signs of acute HF, including SOB, orthopnea, PND and LEE.    Current Outpatient Prescriptions  Medication Sig Dispense Refill  . amLODipine (NORVASC) 10 MG tablet Take 1 tablet (10 mg total) by mouth daily.  30 tablet  11  . aspirin EC 81 MG EC tablet Take 1 tablet (81 mg total) by mouth daily.      . furosemide (LASIX) 20 MG tablet TAKE ONE TABLET BY MOUTH EVERY MORNING  30 tablet  11  . levothyroxine (SYNTHROID, LEVOTHROID) 125 MCG tablet TAKE 1 TABLET BY MOUTH EVERY  MORNING BEFORE BREAKFAST  30 tablet  11  . metoprolol tartrate (LOPRESSOR) 25 MG tablet Take 1 tablet (25 mg total) by mouth 2 (two) times daily.  60 tablet  5  . pantoprazole (PROTONIX) 40 MG tablet TAKE 1 TABLET BY MOUTH ONCE DAILY AT NOON  30 tablet  11  . potassium chloride SA (K-DUR,KLOR-CON) 20 MEQ tablet TAKE 1 TABLET BY MOUTH DAILY  30 tablet  11  . ramipril (ALTACE) 10 MG capsule TAKE 1 CAPSULE BY MOUTH TWICE DAILY  60 capsule  11  . apixaban (ELIQUIS) 5 MG TABS tablet Take 1 tablet (5 mg total) by mouth 2 (two) times daily.  60 tablet  6   No current facility-administered medications for this visit.    Allergies  Allergen Reactions  . Metronidazole     REACTION: Rash, itching    History   Social History  . Marital Status: Single    Spouse Name: N/A    Number of Children: N/A  . Years of Education: N/A   Occupational History  . Not on file.   Social History Main Topics  . Smoking status: Former Smoker    Quit date: 05/16/1969  . Smokeless tobacco: Former Systems developer  . Alcohol Use: No  . Drug Use: No  . Sexual Activity: Not on file   Other Topics Concern  . 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  Cardiovascular: negative for chest pain, dyspnea on exertion, edema, orthopnea, palpitations, paroxysmal nocturnal dyspnea or shortness of breath Dermatological: negative for rash Respiratory: negative for cough or wheezing Urologic: negative for hematuria Abdominal: negative for nausea, vomiting, diarrhea, bright red blood per rectum, melena, or hematemesis Neurologic: negative for visual changes, syncope, or dizziness All other systems reviewed and are otherwise negative except as noted above.    Blood pressure 150/92, pulse 87, height 5'  3" (1.6 m), weight 170 lb 11.2 oz (77.429 kg).  General appearance: alert, cooperative and no distress Neck: no carotid bruit and no JVD Lungs: clear to auscultation bilaterally Heart: irregularly irregular rhythm Extremities: no LEE Pulses: 2+ and symmetric Skin: warm and dry Neurologic: Grossly normal  EKG Atrial Fibrillation; HR 87 bpm  ASSESSMENT AND PLAN:   Paroxysmal atrial fibrillation EKG today demonstrates Afib w/ a controlled ventricular response. She is asymptomatic. Her HR is in the upper 80s. BP is moderately elevated at 150/92. We will increase her Lopressor from 12.5 mg to 25 mg BID. She has a CHA2DS2-VASc score of 4 (CHF, HTN, Age, Sex), placing her at high risk for thromboembolic event. She denies any history of bleeding disorders, PUD and is low risk for falls. I have consulted our office pharmacist and we have elected to place her on Eliquis , 5 mg BID, for stroke prophylaxis.   HTN (hypertension), malignant Moderately elevated today at 150/92. She denies CP/SOB and HA. No signs/symptoms of acute diastolic HF. Will increase Lopressor to 25 mg BID.   Hypothyroidism Recent assessment of thyroid function revealed a TSH level consistent with hypothyroid and not hyperthyroid. Continue management per PCP.    PLAN  Will continue to treat PAF with rate control strategy and stroke prophylaxis. Pt instructed to report any abnormal bleeding. She was instructed to follow up with either myself or Dr. Gwenlyn Found in 3-4 weeks for reassessment. If she continues to stay in AFib, we will discuss the possibility of DCCV.   Arvilla Market 09/17/2013 2:34 PM

## 2013-09-19 ENCOUNTER — Telehealth: Payer: Self-pay | Admitting: *Deleted

## 2013-09-19 NOTE — Telephone Encounter (Signed)
SPOKE TO PATIENT . INFORMATION GIVEN  TO PATIENT PATIENT VERBALIZED UNDERSTANDING.

## 2013-09-19 NOTE — Telephone Encounter (Signed)
She recently had a heart cath, which revealed no CAD. Therefor I think that it is ok to discontinue her daily ASA now that she is on Eliquis to reduce her risk for bleeding.  I would prefer that she uses Tylenol PRN for pain vs Ibuprofen, also to reduce the risk for GI bleeding. It is ok to use Ibuprofen sparingly, but not on a weekly basis.

## 2013-09-19 NOTE — Telephone Encounter (Signed)
Left message, to call back.

## 2013-09-19 NOTE — Telephone Encounter (Signed)
Pt has some questions about her Eliquis. Pt saw [place].

## 2013-09-19 NOTE — Telephone Encounter (Signed)
Patient called back. She wanted to know if she should continue with Aspirin  Along with the Eliquis. She also wanted to know if she can use ibuprofen on as needed basis for pain discomfort.  RN informed patient she will defer to  [place] PA and call her back

## 2013-10-13 ENCOUNTER — Encounter: Payer: Self-pay | Admitting: Cardiology

## 2013-10-13 ENCOUNTER — Ambulatory Visit (INDEPENDENT_AMBULATORY_CARE_PROVIDER_SITE_OTHER): Payer: Commercial Managed Care - HMO | Admitting: Cardiology

## 2013-10-13 VITALS — BP 150/92 | HR 58 | Ht 63.0 in | Wt 161.0 lb

## 2013-10-13 DIAGNOSIS — I4891 Unspecified atrial fibrillation: Secondary | ICD-10-CM

## 2013-10-13 NOTE — Patient Instructions (Signed)
Your physician has recommended that you have a Cardioversion (DCCV). Electrical Cardioversion uses a jolt of electricity to your heart either through paddles or wired patches attached to your chest. This is a controlled, usually prescheduled, procedure. Defibrillation is done under light anesthesia in the hospital, and you usually go home the day of the procedure. This is done to get your heart back into a normal rhythm. You are not awake for the procedure. Please see the instruction sheet given to you today.  Your physician recommends that you schedule a follow-up appointment in:  4 -5 months with Dr. Gwenlyn Found   , If your symptoms worsen let us know and we see you sooner

## 2013-10-13 NOTE — Progress Notes (Signed)
. Patient ID: Joanna Hood, female   DOB: January 17, 1946, 68 y.o.   MRN: [id number]    10/13/2013 Joanna Hood   [birth year]  [id number]  Primary [place], MD Primary Cardiologist: Dr. Gwenlyn Found  HPI:  Joanna Hood is a 68 y/o AAF, followed by [facility] since November 2014 when she was admitted for acute diastolic heart failure, secondary to severe hypertension, complicated by Afib w/ RVR. During that admission, w/u revealed normal coronaries on diagnostic cath and normal LV systolic function with an EF 50-55%. She ultimately spontaneously converted to NSR on IV Cardizem. She was discharged home on Lopressor. At her 2 week post hospital follow up visit, she was doing well and an office EKG demonstrated that she was in NSR. She denied any further symptoms of SOB and palpitations at that time. Her other past medical history is significant for HTN and hypothyroidism, treated with levothyroxine. Recent thyroid testing 4 week ago revealed and elevated TSH of 11.023. Her levothyroxine was adjusted by her PCP.   She was seen back in clinic on 09/13/13 after she was seen by her PCP, Dr. Danise Mina, for routein follow up and was noted to be in atrial fibrillation by EKG. She was instructed to follow up in our office. I evaluated her. An EKG in our office confirmed that she was in Afib with a controlled ventricular response. She denied any awareness. No palpitations, CP, SOB or dizziness. She reported compliance with her medications. She denied any signs of acute HF, including SOB, orthopnea, PND and LEE. I opted to increase her Lopressor to 25 mg BID and we started Eliquis, 5 mg BID, for stroke prophylaxis, as her CHCHA2DS2-VASc score is 4 (CHF, HTN, Age, Sex). She was instructed to follow-up in 4 weeks for reassessment.  She presents back to clinic today. She states that she has been doing fairly well and denies any symptoms since her last visit. She reports daily medication compliance,  including Eliquis. She denies any abnormal bleeding.   EKG done in office today demonstrates Afib with a controlled ventricular response, at 71 bpm. Pulse rate at vitals check was 58.    Current Outpatient Prescriptions  Medication Sig Dispense Refill  . amLODipine (NORVASC) 10 MG tablet Take 10 mg by mouth 2 (two) times daily.      Marland Kitchen apixaban (ELIQUIS) 5 MG TABS tablet Take 1 tablet (5 mg total) by mouth 2 (two) times daily.  60 tablet  6  . furosemide (LASIX) 20 MG tablet TAKE ONE TABLET BY MOUTH EVERY MORNING  30 tablet  11  . levothyroxine (SYNTHROID, LEVOTHROID) 125 MCG tablet TAKE 1 TABLET BY MOUTH EVERY MORNING BEFORE BREAKFAST  30 tablet  11  . metoprolol tartrate (LOPRESSOR) 25 MG tablet Take 1 tablet (25 mg total) by mouth 2 (two) times daily.  60 tablet  5  . pantoprazole (PROTONIX) 40 MG tablet TAKE 1 TABLET BY MOUTH ONCE DAILY AT NOON  30 tablet  11  . potassium chloride SA (K-DUR,KLOR-CON) 20 MEQ tablet TAKE 1 TABLET BY MOUTH DAILY  30 tablet  11  . ramipril (ALTACE) 10 MG capsule TAKE 1 CAPSULE BY MOUTH TWICE DAILY  60 capsule  11   No current facility-administered medications for this visit.    Allergies  Allergen Reactions  . Metronidazole     REACTION: Rash, itching    History   Social History  . Marital Status: Single    Spouse Name: N/A    Number of  Children: N/A  . Years of Education: N/A   Occupational History  . Not on file.   Social History Main Topics  . Smoking status: Former Smoker    Quit date: 05/16/1969  . Smokeless tobacco: Former Systems developer  . Alcohol Use: No  . Drug Use: No  . Sexual Activity: Not on file   Other Topics Concern  . 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  Cardiovascular: negative for chest pain,  dyspnea on exertion, edema, orthopnea, palpitations, paroxysmal nocturnal dyspnea or shortness of breath Dermatological: negative for rash Respiratory: negative for cough or wheezing Urologic: negative for hematuria Abdominal: negative for nausea, vomiting, diarrhea, bright red blood per rectum, melena, or hematemesis Neurologic: negative for visual changes, syncope, or dizziness All other systems reviewed and are otherwise negative except as noted above.    Blood pressure 150/92, pulse 58, height 5\' 3"  (1.6 m), weight 161 lb (73.029 kg).  General appearance: alert, cooperative and no distress Neck: no carotid bruit and no JVD Lungs: clear to auscultation bilaterally Heart: irregularly irregular rhythm Extremities: no LEE Pulses: 2+ and symmetric Skin: warm and dry Neurologic: Grossly normal  EKG Atrial Fibrillation; HR 71 bpm  ASSESSMENT AND PLAN:   Paroxysmal atrial fibrillation EKG today demonstrates Afib w/ a controlled ventricular response. She is asymptomatic. Ventricular rate on EKG is 71 bpm, but pulse check was 58. Will continue current dose of Lopressor, 25 mg BID. Will not increase today in an effort to avoid bradycardia. She has a CHA2DS2-VASc score of 4 (CHF, HTN, Age, Sex), placing her at high risk for thromboembolic event. She will continue Eliquis, 5 mg BID, for stroke prophylaxis. I have recommended that she undergo outpatient DCCV. However, she wishes to consider before making a decsion. Since she is asymptomatic and ventricular rate is controlled and she is protected with anticoagulation, I feel that this is reasonable for now. She will contact our office to scheduled for cardioversion, if she elects to proceed. She was provided patient information on cardioversion's.   Chronic Anticoagulation Denies any abnormal bleeding. No fall history. Continue Eliquis BID.   HTN (hypertension), malignant Moderately elevated today at 150/92. She denies CP/SOB and HA. No  signs/symptoms of acute diastolic HF. Continue Lopressor 25 mg BID.  Hypothyroidism Recent assessment of thyroid function revealed a TSH level consistent with hypothyroid and not hyperthyroid. Continue management per PCP.    PLAN  Continue Lopressor for rate control and Eliquis for AC. I have recommended DCCV. Order was placed. Pt will call back to schedule if she elects to proceed. If she elects to forgo DCCV, then she will need to f/u with Dr. Gwenlyn Found in 3-4 months, s  Tameka Hoiland SimmonsPA-C 10/13/2013 10:29 AM

## 2013-10-26 ENCOUNTER — Other Ambulatory Visit: Payer: Self-pay | Admitting: Family Medicine

## 2013-10-26 DIAGNOSIS — M949 Disorder of cartilage, unspecified: Secondary | ICD-10-CM

## 2013-10-26 DIAGNOSIS — E559 Vitamin D deficiency, unspecified: Secondary | ICD-10-CM

## 2013-10-26 DIAGNOSIS — E785 Hyperlipidemia, unspecified: Secondary | ICD-10-CM

## 2013-10-26 DIAGNOSIS — E039 Hypothyroidism, unspecified: Secondary | ICD-10-CM

## 2013-10-26 DIAGNOSIS — I10 Essential (primary) hypertension: Secondary | ICD-10-CM

## 2013-10-26 DIAGNOSIS — M899 Disorder of bone, unspecified: Secondary | ICD-10-CM

## 2013-10-27 ENCOUNTER — Other Ambulatory Visit (INDEPENDENT_AMBULATORY_CARE_PROVIDER_SITE_OTHER): Payer: Commercial Managed Care - HMO

## 2013-10-27 DIAGNOSIS — M899 Disorder of bone, unspecified: Secondary | ICD-10-CM

## 2013-10-27 DIAGNOSIS — I10 Essential (primary) hypertension: Secondary | ICD-10-CM

## 2013-10-27 DIAGNOSIS — E559 Vitamin D deficiency, unspecified: Secondary | ICD-10-CM

## 2013-10-27 DIAGNOSIS — M949 Disorder of cartilage, unspecified: Principal | ICD-10-CM

## 2013-10-27 DIAGNOSIS — E039 Hypothyroidism, unspecified: Secondary | ICD-10-CM

## 2013-10-27 DIAGNOSIS — E785 Hyperlipidemia, unspecified: Secondary | ICD-10-CM

## 2013-10-27 LAB — BASIC METABOLIC PANEL
BUN: 10 mg/dL (ref 6–23)
CHLORIDE: 104 meq/L (ref 96–112)
CO2: 29 mEq/L (ref 19–32)
CREATININE: 0.9 mg/dL (ref 0.4–1.2)
Calcium: 9.3 mg/dL (ref 8.4–10.5)
GFR: 77.47 mL/min (ref >60.00)
Glucose, Bld: 101 mg/dL — ABNORMAL HIGH (ref 70–99)
Potassium: 3.3 mEq/L — ABNORMAL LOW (ref 3.5–5.1)
Sodium: 141 mEq/L (ref 135–145)

## 2013-10-27 LAB — LIPID PANEL
Cholesterol: 160 mg/dL (ref 0–200)
HDL: 53.7 mg/dL (ref >39.00)
LDL Cholesterol: 96 mg/dL (ref 0–99)
Total CHOL/HDL Ratio: 3
Triglycerides: 52 mg/dL (ref 0.0–149.0)
VLDL: 10.4 mg/dL (ref 0.0–40.0)

## 2013-10-27 LAB — TSH: TSH: 1.01 u[IU]/mL (ref 0.35–4.50)

## 2013-10-28 LAB — VITAMIN D 25 HYDROXY (VIT D DEFICIENCY, FRACTURES): Vit D, 25-Hydroxy: 14 ng/mL — ABNORMAL LOW (ref 30–89)

## 2013-11-01 ENCOUNTER — Other Ambulatory Visit: Payer: Self-pay | Admitting: Family Medicine

## 2013-11-01 DIAGNOSIS — Z1231 Encounter for screening mammogram for malignant neoplasm of breast: Secondary | ICD-10-CM

## 2013-11-03 ENCOUNTER — Encounter: Payer: Self-pay | Admitting: Family Medicine

## 2013-11-03 ENCOUNTER — Ambulatory Visit (INDEPENDENT_AMBULATORY_CARE_PROVIDER_SITE_OTHER): Payer: Commercial Managed Care - HMO | Admitting: Family Medicine

## 2013-11-03 ENCOUNTER — Ambulatory Visit (INDEPENDENT_AMBULATORY_CARE_PROVIDER_SITE_OTHER)
Admission: RE | Admit: 2013-11-03 | Discharge: 2013-11-03 | Disposition: A | Discharge status: Home / Self Care | Payer: Commercial Managed Care - HMO | Source: Ambulatory Visit | Attending: Family Medicine | Admitting: Family Medicine

## 2013-11-03 VITALS — BP 142/78 | HR 64 | Temp 97.9°F | Ht 63.0 in | Wt 168.0 lb

## 2013-11-03 DIAGNOSIS — I4891 Unspecified atrial fibrillation: Secondary | ICD-10-CM

## 2013-11-03 DIAGNOSIS — Z780 Asymptomatic menopausal state: Secondary | ICD-10-CM

## 2013-11-03 DIAGNOSIS — I10 Essential (primary) hypertension: Secondary | ICD-10-CM

## 2013-11-03 DIAGNOSIS — R7309 Other abnormal glucose: Secondary | ICD-10-CM

## 2013-11-03 DIAGNOSIS — M949 Disorder of cartilage, unspecified: Secondary | ICD-10-CM

## 2013-11-03 DIAGNOSIS — M899 Disorder of bone, unspecified: Secondary | ICD-10-CM

## 2013-11-03 DIAGNOSIS — J209 Acute bronchitis, unspecified: Secondary | ICD-10-CM

## 2013-11-03 DIAGNOSIS — E039 Hypothyroidism, unspecified: Secondary | ICD-10-CM

## 2013-11-03 DIAGNOSIS — E785 Hyperlipidemia, unspecified: Secondary | ICD-10-CM

## 2013-11-03 DIAGNOSIS — I480 Paroxysmal atrial fibrillation: Secondary | ICD-10-CM

## 2013-11-03 DIAGNOSIS — Z0000 Encounter for general adult medical examination without abnormal findings: Secondary | ICD-10-CM | POA: Insufficient documentation

## 2013-11-03 DIAGNOSIS — E559 Vitamin D deficiency, unspecified: Secondary | ICD-10-CM | POA: Insufficient documentation

## 2013-11-03 MED ORDER — DOXYCYCLINE HYCLATE 100 MG PO CAPS: 100.0000 mg | ORAL_CAPSULE | Freq: Two times a day (BID) | ORAL | Status: DC

## 2013-11-03 MED ORDER — ERGOCALCIFEROL 1.25 MG (50000 UT) PO CAPS: 50000.0000 [IU] | ORAL_CAPSULE | ORAL | Status: DC

## 2013-11-03 NOTE — Assessment & Plan Note (Addendum)
I have personally reviewed the Medicare Annual Wellness questionnaire and have noted 1. The patient's medical and social history 2. Their use of alcohol, tobacco or illicit drugs 3. Their current medications and supplements 4. The patient's functional ability including ADL's, fall risks, home safety risks and hearing or visual impairment. 5. Diet and physical activity 6. Evidence for depression or mood disorders The patients weight, height, BMI have been recorded in the chart.  Hearing and vision has been addressed. I have made referrals, counseling and provided education to the patient based review of the above and I have provided the pt with a written personalized care plan for preventive services. See scanned questionairre. Advanced directives discussed: HCPOA would be daughter.  Packet provided today.  Reviewed preventative protocols and updated unless pt declined. Declines immunizations today. Asks to defer pelvic exam - will discuss next year. Will defer CBE- as mammogram scheduled next week.

## 2013-11-03 NOTE — Assessment & Plan Note (Signed)
Lab Results  Component Value Date   TSH 1.01 10/27/2013  thyroid function is back to normal with recommencement of levothyroxine. Continue current dose.

## 2013-11-03 NOTE — Assessment & Plan Note (Signed)
Improved control but still somewhat elevated.  Tolerating increased lopressor dose.  No changes today.

## 2013-11-03 NOTE — Assessment & Plan Note (Signed)
Stable on current regimen of lopressor, eliquis.  Has f/u with Dr. Gwenlyn Found scheduled. CHA2DS2-VASc score = 4.

## 2013-11-03 NOTE — Patient Instructions (Addendum)
We will call you in the next few weeks to schedule a bone density scan to follow possible osteopenia. Advanced directive packet provided today. Increase potassium to twice daily for next 3 days and increase fruits/vegetables. Start vitamin D 50,000 units weekly for 8 weeks then start over the counter 2000 units daily  For cough I think you have bronchitis - treat with doxycycline 10 day course.  Push fluids and plenty of rest.  Update [place] if symptoms persist or fail to improve.

## 2013-11-03 NOTE — Progress Notes (Signed)
BP 142/78  Pulse 64  Temp(Src) 97.9 F (36.6 C) (Oral)  Ht 5\' 3"  (1.6 m)  Wt 168 lb (76.204 kg)  BMI 29.77 kg/m2   CC: medicare wellness visit, initial Subjective:    Patient ID: Joanna Hood, female    DOB: [birth year], 68 y.o.   MRN: [id number]  HPI: Joanna Hood is a 68 y.o. female presenting on 11/03/2013 for Annual Exam   Over 1 year since she started receiving medicare.  Staying congested. Ongoing productive cough over last 1.5 weeks.  No fevers.  Chest > head congestion.  No h/o asthma or COPD.  Ex-smoker.  Passes hearing and vision screens today. Denies falls or depression or anhedonia.  Preventative:  Colonoscopy 1 polyp 2011 (Dr. Henrene Pastor).  rec rpt 5 yrs. Well woman exam - last 3 yrs ago - partial hysterectomy 1970.  All normal pap smears.  Declines this year, would like to discuss next year. dexa - done in past but unsure results. Mammogram scheduled this week. Flu shot - 2014  Pneumovax 04/2013  Td 2005 zostavax - not interested Advanced directives: HCproxy would be daughter.  Would like advanced directive packet.  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  Allergies and medications reviewed and updated. Current Outpatient Prescriptions on File Prior to Visit  Medication Sig  . amLODipine (NORVASC) 10 MG tablet Take 10 mg by mouth 2 (two) times daily.  Marland Kitchen apixaban (ELIQUIS) 5 MG TABS tablet Take 1 tablet (5 mg total) by mouth 2 (two) times daily.  . furosemide (LASIX) 20 MG tablet TAKE ONE TABLET BY MOUTH EVERY MORNING  . levothyroxine (SYNTHROID, LEVOTHROID) 125 MCG tablet TAKE 1 TABLET BY MOUTH EVERY MORNING BEFORE BREAKFAST  . metoprolol tartrate (LOPRESSOR) 25 MG tablet Take 1 tablet (25 mg total) by mouth 2 (two) times daily.  . pantoprazole (PROTONIX) 40 MG  tablet TAKE 1 TABLET BY MOUTH ONCE DAILY AT NOON  . potassium chloride SA (K-DUR,KLOR-CON) 20 MEQ tablet TAKE 1 TABLET BY MOUTH DAILY  . ramipril (ALTACE) 10 MG capsule TAKE 1 CAPSULE BY MOUTH TWICE DAILY   No current facility-administered medications on file prior to visit.    Review of Systems Per HPI unless specifically indicated above    Objective:    BP 142/78  Pulse 64  Temp(Src) 97.9 F (36.6 C) (Oral)  Ht 5\' 3"  (1.6 m)  Wt 168 lb (76.204 kg)  BMI 29.77 kg/m2  Physical Exam  Nursing note and vitals reviewed. Constitutional: She is oriented to person, place, and time. She appears well-developed and well-nourished. No distress.  HENT:  Head: Normocephalic and atraumatic.  Right Ear: Hearing, tympanic membrane, external ear and ear canal normal.  Left Ear: Hearing, tympanic membrane, external ear and ear canal normal.  Nose: Nose normal.  Mouth/Throat: Uvula is midline, oropharynx is clear and moist and mucous membranes are normal. No oropharyngeal exudate, posterior oropharyngeal edema or posterior oropharyngeal erythema.  Eyes: Conjunctivae and EOM are normal. Pupils are equal, round, and reactive to light. No scleral icterus.  Neck: Normal range of motion. Neck supple. Carotid bruit is not present. No thyromegaly present.  Cardiovascular: Normal rate, regular rhythm, normal heart sounds and intact distal pulses.   No murmur heard. Pulses:      Radial pulses are 2+ on the right side, and 2+ on the left side.  Pulmonary/Chest: Effort normal. No respiratory distress. She has decreased breath sounds. She has wheezes (faint exp bilateral). She has rhonchi (scattered). She has no rales.  Abdominal: Soft. Bowel sounds are normal. She exhibits no distension and no mass. There is no tenderness. There is no rebound and no guarding.  Musculoskeletal: Normal range of motion. She exhibits no edema.  Lymphadenopathy:    She has no cervical adenopathy.  Neurological: She is alert and  oriented to person, place, and time.  CN grossly intact, station and gait intact 3/3 recall 5/5 calculation D-[id number]  Skin: Skin is warm and dry. No rash noted.  Psychiatric: She has a normal mood and affect. Her behavior is normal. Judgment and thought content normal.   Results for orders placed in visit on 10/27/13  VITAMIN D 25 HYDROXY      Result Value Ref Range   Vit D, 25-Hydroxy 14 (*) 30 - 89 ng/mL  LIPID PANEL      Result Value Ref Range   Cholesterol 160  0 - 200 mg/dL   Triglycerides 52.0  0.0 - 149.0 mg/dL   HDL 53.70  >39.00 mg/dL   VLDL 10.4  0.0 - 40.0 mg/dL   LDL Cholesterol 96  0 - 99 mg/dL   Total CHOL/HDL Ratio 3    BASIC METABOLIC PANEL      Result Value Ref Range   Sodium 141  135 - 145 mEq/L   Potassium 3.3 (*) 3.5 - 5.1 mEq/L   Chloride 104  96 - 112 mEq/L   CO2 29  19 - 32 mEq/L   Glucose, Bld 101 (*) 70 - 99 mg/dL   BUN 10  6 - 23 mg/dL   Creatinine, Ser 0.9  0.4 - 1.2 mg/dL   Calcium 9.3  8.4 - 10.5 mg/dL   GFR 77.47  >60.00 mL/min  TSH      Result Value Ref Range   TSH 1.01  0.35 - 4.50 uIU/mL      Assessment & Plan:   Problem List Items Addressed This Visit   HYPOTHYROIDISM      Lab Results  Component Value Date   TSH 1.01 10/27/2013  thyroid function is back to normal with recommencement of levothyroxine. Continue current dose.    OSTEOPENIA - Primary     Has this dx in chart - will order dexa today.    Relevant Orders      DG Bone Density   HYPERGLYCEMIA     continue to monitor.    POSTMENOPAUSAL STATUS   Paroxysmal atrial fibrillation     Stable on current regimen of lopressor, eliquis.  Has f/u with Dr. Gwenlyn Found scheduled. CHA2DS2-VASc score = 4.    HTN (hypertension), malignant     Improved control but still somewhat elevated.  Tolerating increased lopressor dose.  No changes today.    Medicare annual wellness visit, initial     I have personally reviewed the Medicare Annual Wellness questionnaire and have noted 1. The  patient's medical and social history 2. Their use of alcohol, tobacco or illicit drugs 3. Their current medications and supplements 4. The patient's functional ability including ADL's, fall risks, home safety risks and hearing or visual impairment. 5. Diet and physical activity 6. Evidence for depression or mood disorders The patients weight, height, BMI have been recorded in the chart.  Hearing and vision has been addressed. I have made referrals, counseling and provided education to the patient based review of the above and I have provided  the pt with a written personalized care plan for preventive services. See scanned questionairre. Advanced directives discussed: HCPOA would be daughter.  Packet provided today.  Reviewed preventative protocols and updated unless pt declined. Declines immunizations today. Asks to defer pelvic exam - will discuss next year. Will defer CBE- as mammogram scheduled next week.    Acute bronchitis     Of 1.5 wk duration along with abnormal breath sounds. No hx COPD. Will check CXR and treat for presumed bronchitis with doxy course.    Relevant Orders      DG Chest 2 View (Completed)   Vitamin D deficiency     Start 50,000 IU weekly for 8 weeks, then start 2000 IU daily.    RESOLVED: HYPERLIPIDEMIA     Reviewed chol levels off meds - no elevated levels today. Will remove from problem list       Follow up plan: Return in about 6 months (around 05/06/2014), or as needed, for follow up visit.

## 2013-11-03 NOTE — Assessment & Plan Note (Signed)
Has this dx in chart - will order dexa today.

## 2013-11-03 NOTE — Assessment & Plan Note (Signed)
Of 1.5 wk duration along with abnormal breath sounds. No hx COPD. Will check CXR and treat for presumed bronchitis with doxy course.

## 2013-11-03 NOTE — Assessment & Plan Note (Signed)
Start 50,000 IU weekly for 8 weeks, then start 2000 IU daily.

## 2013-11-03 NOTE — Assessment & Plan Note (Signed)
continue to monitor

## 2013-11-03 NOTE — Progress Notes (Signed)
Pre visit review using our clinic review tool, if applicable. No additional management support is needed unless otherwise documented below in the visit note. 

## 2013-11-03 NOTE — Assessment & Plan Note (Signed)
Reviewed chol levels off meds - no elevated levels today. Will remove from problem list

## 2013-11-07 ENCOUNTER — Telehealth: Payer: Self-pay | Admitting: Cardiology

## 2013-11-07 NOTE — Telephone Encounter (Signed)
Left message to call back  

## 2013-11-07 NOTE — Telephone Encounter (Signed)
Pt has not been seen back in the office since 3/25. She probably needs a ROV to discuss management of her AFIB  JJB

## 2013-11-07 NOTE — Telephone Encounter (Signed)
Pt said Joanna Hood had suggested that she have a cardioversion. She saw her primary doctor on 11-03-13,he suggested she continue taking the Eliquis foe a while and see how that does.

## 2013-11-07 NOTE — Telephone Encounter (Signed)
RN spoke to patient.  Patient states PCP wanted her to wait at least 8 months to do anything in regards to having a cardioversion. RN informed patient usually a cardioversion is done as soon as possible to aid in a permanent conversion. RN informed patient that Eliquis is not mediaction that converts her heart rhythm.  RN informed patient she will let Brittainy PA/Dr Gwenlyn Found Curt Bears RN know of  Her decision.

## 2013-11-08 ENCOUNTER — Ambulatory Visit (HOSPITAL_COMMUNITY)
Admission: RE | Admit: 2013-11-08 | Discharge: 2013-11-08 | Disposition: A | Discharge status: Home / Self Care | Payer: Medicare HMO | Source: Ambulatory Visit | Attending: Family Medicine | Admitting: Family Medicine

## 2013-11-08 DIAGNOSIS — Z1231 Encounter for screening mammogram for malignant neoplasm of breast: Secondary | ICD-10-CM | POA: Insufficient documentation

## 2013-11-08 LAB — HM MAMMOGRAPHY: HM MAMMO: NORMAL

## 2013-11-08 NOTE — Telephone Encounter (Signed)
Patient has recall visit @3 -4 months.Reviewed with Livia Snellen, Keep schedule recall

## 2013-11-09 ENCOUNTER — Encounter: Payer: Self-pay | Admitting: *Deleted

## 2013-12-20 DIAGNOSIS — M8580 Other specified disorders of bone density and structure, unspecified site: Secondary | ICD-10-CM

## 2013-12-20 HISTORY — DX: Other specified disorders of bone density and structure, unspecified site: M85.80

## 2013-12-26 ENCOUNTER — Ambulatory Visit (HOSPITAL_COMMUNITY)
Admission: RE | Admit: 2013-12-26 | Discharge: 2013-12-26 | Disposition: A | Discharge status: Home / Self Care | Payer: Medicare HMO | Source: Ambulatory Visit | Attending: Family Medicine | Admitting: Family Medicine

## 2013-12-26 DIAGNOSIS — M949 Disorder of cartilage, unspecified: Secondary | ICD-10-CM

## 2013-12-26 DIAGNOSIS — Z13820 Encounter for screening for osteoporosis: Secondary | ICD-10-CM | POA: Insufficient documentation

## 2013-12-26 DIAGNOSIS — Z780 Asymptomatic menopausal state: Secondary | ICD-10-CM | POA: Insufficient documentation

## 2013-12-26 DIAGNOSIS — M899 Disorder of bone, unspecified: Secondary | ICD-10-CM

## 2013-12-31 ENCOUNTER — Encounter: Payer: Self-pay | Admitting: Family Medicine

## 2014-01-01 ENCOUNTER — Encounter: Payer: Self-pay | Admitting: *Deleted

## 2014-02-05 ENCOUNTER — Telehealth: Payer: Self-pay | Admitting: Cardiovascular Disease

## 2014-02-05 ENCOUNTER — Encounter: Payer: Self-pay | Admitting: Cardiovascular Disease

## 2014-02-08 ENCOUNTER — Ambulatory Visit: Payer: Commercial Managed Care - HMO | Admitting: Cardiovascular Disease

## 2014-02-09 NOTE — Telephone Encounter (Signed)
Closed encounter °

## 2014-03-19 ENCOUNTER — Telehealth: Payer: Self-pay | Admitting: Cardiovascular Disease

## 2014-03-19 NOTE — Telephone Encounter (Signed)
Closed encounter °

## 2014-03-21 ENCOUNTER — Ambulatory Visit: Payer: Commercial Managed Care - HMO | Admitting: Cardiovascular Disease

## 2014-05-01 ENCOUNTER — Encounter: Payer: Self-pay | Admitting: Cardiovascular Disease

## 2014-05-01 ENCOUNTER — Ambulatory Visit (INDEPENDENT_AMBULATORY_CARE_PROVIDER_SITE_OTHER): Payer: Commercial Managed Care - HMO | Admitting: Cardiovascular Disease

## 2014-05-01 VITALS — BP 140/92 | HR 78 | Ht 64.0 in | Wt 171.0 lb

## 2014-05-01 DIAGNOSIS — I480 Paroxysmal atrial fibrillation: Secondary | ICD-10-CM

## 2014-05-01 DIAGNOSIS — I10 Essential (primary) hypertension: Secondary | ICD-10-CM

## 2014-05-01 MED ORDER — METOPROLOL TARTRATE 25 MG PO TABS: 25.0000 mg | ORAL_TABLET | Freq: Two times a day (BID) | ORAL | Status: DC

## 2014-05-01 MED ORDER — RAMIPRIL 10 MG PO CAPS: 10.0000 mg | ORAL_CAPSULE | Freq: Two times a day (BID) | ORAL | Status: DC

## 2014-05-01 MED ORDER — POTASSIUM CHLORIDE CRYS ER 20 MEQ PO TBCR: EXTENDED_RELEASE_TABLET | ORAL | Status: DC

## 2014-05-01 MED ORDER — PANTOPRAZOLE SODIUM 40 MG PO TBEC: 40.0000 mg | DELAYED_RELEASE_TABLET | Freq: Every day | ORAL | Status: DC

## 2014-05-01 MED ORDER — FUROSEMIDE 20 MG PO TABS: 20.0000 mg | ORAL_TABLET | Freq: Every day | ORAL | Status: DC

## 2014-05-01 MED ORDER — APIXABAN 5 MG PO TABS: 5.0000 mg | ORAL_TABLET | Freq: Two times a day (BID) | ORAL | Status: DC

## 2014-05-01 NOTE — Assessment & Plan Note (Signed)
Joanna Hood fibrillation is persistent now. It was first diagnosed a year ago and she spontaneously converted with the aid of IV Cardizem to sinus rhythm during an episode of congestive heart failure and hypertension. When she was seen back in the office 6 months ago by [employer] PA-C she is again noted to be in atrial fibrillation at that time her beta blocker was adjusted and Eliquis t was added for stroke prophylaxis.she is unaware that she is nasal fibrillation and is asymptomatic.

## 2014-05-01 NOTE — Patient Instructions (Signed)
We request that you follow-up in: 6 months with Lyda Jester, PA-C and in 1 year with Dr Andria Rhein will receive a reminder letter in the mail two months in advance. If you don't receive a letter, please call our office to schedule the follow-up appointment.

## 2014-05-01 NOTE — Progress Notes (Signed)
05/01/2014 Joanna Hood   10-28-1945  [id number]  Primary Physician Ria Bush, MD Primary Cardiologist: Lorretta Harp MD Renae Gloss   HPI:  Joanna Hood is a delightful 68 year old moderately overweight African-American female who I initially met one year ago when she was admitted to [facility] with diastolic heart failure, hypertension and atrial fibrillation. She ultimately underwent cardiac catheterization revealing essentially normal coronary arteries and normal LV function by Dr. Ellyn Hack. Her A. Fib converted to sinus rhythm with the aid of IV Cardizem. Her blood pressure was better controlled. She saw Ellen Henri PA-C in the office 6 months ago and again was in atrial fibrillation at which time her beta blocker was adjusted and Eliquis was added because of increased CHA2DSVASC2 score of 2.  Current Outpatient Prescriptions  Medication Sig Dispense Refill  . amLODipine (NORVASC) 10 MG tablet Take 10 mg by mouth 2 (two) times daily.    Marland Kitchen apixaban (ELIQUIS) 5 MG TABS tablet Take 1 tablet (5 mg total) by mouth 2 (two) times daily. 60 tablet 6  . cholecalciferol (VITAMIN D) 1000 UNITS tablet Take 1,000 Units by mouth daily.    . furosemide (LASIX) 20 MG tablet TAKE ONE TABLET BY MOUTH EVERY MORNING 30 tablet 11  . levothyroxine (SYNTHROID, LEVOTHROID) 125 MCG tablet TAKE 1 TABLET BY MOUTH EVERY MORNING BEFORE BREAKFAST 30 tablet 11  . metoprolol tartrate (LOPRESSOR) 25 MG tablet Take 1 tablet (25 mg total) by mouth 2 (two) times daily. 60 tablet 5  . pantoprazole (PROTONIX) 40 MG tablet TAKE 1 TABLET BY MOUTH ONCE DAILY AT NOON 30 tablet 11  . potassium chloride SA (K-DUR,KLOR-CON) 20 MEQ tablet TAKE 1 TABLET BY MOUTH DAILY 30 tablet 11  . ramipril (ALTACE) 10 MG capsule TAKE 1 CAPSULE BY MOUTH TWICE DAILY 60 capsule 11   No current facility-administered medications for this visit.    Allergies  Allergen Reactions  . Metronidazole    REACTION: Rash, itching    History   Social History  . Marital Status: Single    Spouse Name: N/A    Number of Children: N/A  . Years of Education: N/A   Occupational History  . Not on file.   Social History Main Topics  . Smoking status: Former Smoker    Quit date: 05/16/1969  . Smokeless tobacco: Former Systems developer  . Alcohol Use: No  . Drug Use: No  . Sexual Activity: Not on file   Other Topics Concern  . 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  Cardiovascular: negative for chest pain, dyspnea on exertion, edema, orthopnea, palpitations, paroxysmal nocturnal dyspnea or shortness of breath Dermatological: negative for rash Respiratory: negative for cough or wheezing Urologic: negative for hematuria Abdominal: negative for nausea, vomiting, diarrhea, bright red blood per rectum, melena, or hematemesis Neurologic: negative for visual changes, syncope, or dizziness All other systems reviewed and are otherwise negative except as noted above.    Blood pressure 140/92, pulse 78, height _0  (1.626 m), weight 171 lb (77.565 kg).  General appearance: alert and no distress Neck: no adenopathy, no carotid bruit, no JVD, supple, symmetrical, trachea midline and thyroid not enlarged, symmetric, no tenderness/mass/nodules Lungs: clear to auscultation bilaterally Heart: irregularly irregular rhythm Extremities: extremities  normal, atraumatic, no cyanosis or edema  EKG atrial fibrillation with a ventricular response of 78 and nonspecific ST and T-wave changes with occasional aberrantly conducted beats  ASSESSMENT AND PLAN:   Paroxysmal atrial fibrillation Apolonio Schneiders fibrillation is persistent now. It was first diagnosed a year ago and she spontaneously  converted with the aid of IV Cardizem to sinus rhythm during an episode of congestive heart failure and hypertension. When she was seen back in the office 6 months ago by [employer] PA-C she is again noted to be in atrial fibrillation at that time her beta blocker was adjusted and Eliquis t was added for stroke prophylaxis.she is unaware that she is nasal fibrillation and is asymptomatic.  HTN (hypertension), malignant Borderline controlled on current medications. She is aware of salt restriction.      Lorretta Harp MD FACP,FACC,FAHA, [facility] 05/01/2014 9:21 AM

## 2014-05-01 NOTE — Assessment & Plan Note (Signed)
Borderline controlled on current medications. She is aware of salt restriction.

## 2014-05-02 ENCOUNTER — Encounter: Payer: Self-pay | Admitting: Family Medicine

## 2014-05-07 ENCOUNTER — Other Ambulatory Visit: Payer: Self-pay | Admitting: Cardiology

## 2014-05-07 NOTE — Telephone Encounter (Signed)
Rx refill sent to patient pharmacy   

## 2014-05-31 ENCOUNTER — Encounter (HOSPITAL_COMMUNITY): Payer: Self-pay | Admitting: Cardiology

## 2014-06-11 ENCOUNTER — Ambulatory Visit (INDEPENDENT_AMBULATORY_CARE_PROVIDER_SITE_OTHER): Payer: Commercial Managed Care - HMO | Admitting: Internal Medicine

## 2014-06-11 ENCOUNTER — Encounter: Payer: Self-pay | Admitting: Internal Medicine

## 2014-06-11 VITALS — BP 134/86 | HR 68 | Temp 97.6°F | Wt 175.0 lb

## 2014-06-11 DIAGNOSIS — J209 Acute bronchitis, unspecified: Secondary | ICD-10-CM

## 2014-06-11 MED ORDER — HYDROCODONE-HOMATROPINE 5-1.5 MG/5ML PO SYRP: 5.0000 mL | ORAL_SOLUTION | Freq: Three times a day (TID) | ORAL | Status: DC | PRN

## 2014-06-11 MED ORDER — AZITHROMYCIN 250 MG PO TABS: ORAL_TABLET | ORAL | Status: DC

## 2014-06-11 NOTE — Progress Notes (Signed)
HPI  Pt presents to the clinic today with c/o cough. She reports this started 2-3 weeks ago. The cough is productive of yellow mucous. She denies other URI symptoms. She denies fever, chills or body aches. She has taken Mucinex OTC without much relief. She has had sick contacts. She has no history of allergies or breathing problems.  Review of Systems      Past Medical History  Diagnosis Date  . Malignant hypertension longstanding  . Hypothyroidism   . SOB (shortness of breath) 04/29/2013  . HLD (hyperlipidemia)   . Chronic diastolic CHF (congestive heart failure)     HTN  . Atrial fibrillation with RVR 2014    persistent  . History of chicken pox   . History of colon polyps     benign  . Heart murmur   . History of anemia     unclear cause, now resolved  . Osteopenia 12/2013    T -2.3 hip    Family History  Problem Relation Age of Onset  . Cancer Mother     ovarian  . Cancer Maternal Grandmother     lung  . Diabetes Sister   . Hypertension Sister   . Hypertension Cousin   . Sudden death Mother   . CAD Neg Hx   . Stroke Neg Hx     History   Social History  . Marital Status: Single    Spouse Name: N/A    Number of Children: N/A  . Years of Education: N/A   Occupational History  . Not on file.   Social History Main Topics  . Smoking status: Former Smoker    Quit date: 05/16/1969  . Smokeless tobacco: Former Systems developer  . Alcohol Use: No  . Drug Use: No  . Sexual Activity: Not on file   Other Topics Concern  . 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 Metronidazole     REACTION: Rash, itching     Constitutional:  Denies headache, fatigue, fever or abrupt weight changes.  HEENT:  Denies eye redness, eye pain, pressure behind the eyes, facial pain, nasal congestion, ear pain, ringing  in the ears, wax buildup, runny nose or bloody nose. Respiratory: Positive cough. Denies difficulty breathing or shortness of breath.  Cardiovascular: Denies chest pain, chest tightness, palpitations or swelling in the hands or feet.   No other specific complaints in a complete review of systems (except as listed in HPI above).  Objective:   BP 134/86 mmHg  Pulse 68  Temp(Src) 97.6 F (36.4 C) (Oral)  Wt 175 lb (79.379 kg)  SpO2 98%  Wt Readings from Last 3 Encounters:  06/11/14 175 lb (79.379 kg)  05/01/14 171 lb (77.565 kg)  11/03/13 168 lb (76.204 kg)     General: Appears her stated age, ill appearing in NAD. HEENT: Head: normal shape and size, no sinus tenderness noted; Eyes: sclera white, no icterus, conjunctiva pink; Left Ears: Tm's gray and intact, normal light reflex; Right Ear: cerumen impaction on the right; Nose: mucosa pink and moist, septum midline; Throat/Mouth: Teeth present, mucosa pink and moist, no exudate noted, no lesions or ulcerations noted.  Neck: No adenopathy noted.  Cardiovascular: Normal rate and rhythm. S1,S2 noted.  Murmur noted. No rubs or gallops noted. No JVD or BLE edema. No carotid  bruits noted. Pulmonary/Chest: Normal effort and scattered rhonchi throughout. No respiratory distress. No wheezes, rales noted.      Assessment & Plan:   Acute Bronchitis:  Get some rest and drink plenty of water eRx for Azithromax x 5 days Rx for Hycodan cough syrup  RTC as needed or if symptoms persist.

## 2014-06-11 NOTE — Progress Notes (Signed)
Pre visit review using our clinic review tool, if applicable. No additional management support is needed unless otherwise documented below in the visit note. 

## 2014-06-11 NOTE — Patient Instructions (Signed)
Cough, Adult  A cough is a reflex that helps clear your throat and airways. It can help heal the body or may be a reaction to an irritated airway. A cough may only last 2 or 3 weeks (acute) or may last more than 8 weeks (chronic).  CAUSES Acute cough:  Viral or bacterial infections. Chronic cough:  Infections.  Allergies.  Asthma.  Post-nasal drip.  Smoking.  Heartburn or acid reflux.  Some medicines.  Chronic lung problems (COPD).  Cancer. SYMPTOMS   Cough.  Fever.  Chest pain.  Increased breathing rate.  High-pitched whistling sound when breathing (wheezing).  Colored mucus that you cough up (sputum). TREATMENT   A bacterial cough may be treated with antibiotic medicine.  A viral cough must run its course and will not respond to antibiotics.  Your caregiver may recommend other treatments if you have a chronic cough. HOME CARE INSTRUCTIONS   Only take over-the-counter or prescription medicines for pain, discomfort, or fever as directed by your caregiver. Use cough suppressants only as directed by your caregiver.  Use a cold steam vaporizer or humidifier in your bedroom or home to help loosen secretions.  Sleep in a semi-upright position if your cough is worse at night.  Rest as needed.  Stop smoking if you smoke. SEEK IMMEDIATE MEDICAL CARE IF:   You have pus in your sputum.  Your cough starts to worsen.  You cannot control your cough with suppressants and are losing sleep.  You begin coughing up blood.  You have difficulty breathing.  You develop pain which is getting worse or is uncontrolled with medicine.  You have a fever. MAKE SURE YOU:   Understand these instructions.  Will watch your condition.  Will get help right away if you are not doing well or get worse. Document Released: 12/05/2010 Document Revised: 08/31/2011 Document Reviewed: 12/05/2010 ExitCare Patient Information 2015 [employer]. This information is not intended  to replace advice given to you by your health care provider. Make sure you discuss any questions you have with your health care provider.  

## 2014-07-07 ENCOUNTER — Other Ambulatory Visit: Payer: Self-pay | Admitting: Cardiology

## 2014-07-09 NOTE — Telephone Encounter (Signed)
Rx(s) sent to pharmacy electronically.  

## 2014-07-24 ENCOUNTER — Ambulatory Visit (INDEPENDENT_AMBULATORY_CARE_PROVIDER_SITE_OTHER)
Admission: RE | Admit: 2014-07-24 | Discharge: 2014-07-24 | Disposition: A | Discharge status: Home / Self Care | Payer: Commercial Managed Care - HMO | Source: Ambulatory Visit | Attending: Family Medicine | Admitting: Family Medicine

## 2014-07-24 ENCOUNTER — Encounter: Payer: Self-pay | Admitting: Family Medicine

## 2014-07-24 ENCOUNTER — Ambulatory Visit (INDEPENDENT_AMBULATORY_CARE_PROVIDER_SITE_OTHER): Payer: Commercial Managed Care - HMO | Admitting: Family Medicine

## 2014-07-24 VITALS — BP 150/82 | HR 84 | Temp 98.1°F | Wt 174.8 lb

## 2014-07-24 DIAGNOSIS — Z8709 Personal history of other diseases of the respiratory system: Secondary | ICD-10-CM | POA: Diagnosis not present

## 2014-07-24 DIAGNOSIS — R05 Cough: Secondary | ICD-10-CM

## 2014-07-24 DIAGNOSIS — R059 Cough, unspecified: Secondary | ICD-10-CM

## 2014-07-24 DIAGNOSIS — J189 Pneumonia, unspecified organism: Secondary | ICD-10-CM | POA: Diagnosis not present

## 2014-07-24 DIAGNOSIS — R509 Fever, unspecified: Secondary | ICD-10-CM | POA: Diagnosis not present

## 2014-07-24 DIAGNOSIS — J181 Lobar pneumonia, unspecified organism: Principal | ICD-10-CM

## 2014-07-24 HISTORY — DX: Pneumonia, unspecified organism: J18.9

## 2014-07-24 LAB — POCT INFLUENZA A/B
INFLUENZA A, POC: NEGATIVE
INFLUENZA B, POC: NEGATIVE

## 2014-07-24 MED ORDER — AZITHROMYCIN 250 MG PO TABS: ORAL_TABLET | ORAL | Status: DC

## 2014-07-24 MED ORDER — HYDROCODONE-HOMATROPINE 5-1.5 MG/5ML PO SYRP: 5.0000 mL | ORAL_SOLUTION | Freq: Every evening | ORAL | Status: DC | PRN

## 2014-07-24 MED ORDER — CEFTRIAXONE SODIUM 1 G IJ SOLR
1.0000 g | Freq: Once | INTRAMUSCULAR | Status: AC
  Administered 2014-07-24: 1 g via INTRAMUSCULAR

## 2014-07-24 NOTE — Patient Instructions (Addendum)
Flu swab negative today. I'm worried about right lower pneumonia. Treat with shot of antibiotic today as well as zpack sent to pharmacy. I've refilled cough syrup. Push fluids and rest. Let us know if fever > 101 or worsening productive cough despite treatment or more shortwinded.

## 2014-07-24 NOTE — Progress Notes (Signed)
BP 150/82 mmHg  Pulse 84  Temp(Src) 98.1 F (36.7 C) (Oral)  Wt 174 lb 12 oz (79.266 kg)  SpO2 95%   CC: cough  Subjective:    Patient ID: Joanna Hood, female    DOB: 1945/08/30, 69 y.o.   MRN: [id number]  HPI: Joanna Hood is a 69 y.o. female presenting on 07/24/2014 for Cough and Fever   Saw Rollene Fare 06/11/2014 with cough, fever, chills and dx with bronchitis, treated with zpack and hydrocodone cough syrup. sxs resolved.  Now with 5d h/o recurrent dry cough, fever to 100.6, chills, fatigue, some burning in eyes.   No ear or tooth pain, abd pain, PNdrainage, ST, headaches. No dyspnea or wheeze. No body or muscle aches. No congestion.   Treated with some left over cough syrup. No sick contacts at home. No smokers at home. Remote smoking history. No h/o asthma/COPD.   H/o hypertension, chronic diastolic CHF, persistent atrial fibrillation on eliquis bid.  Retired, used to help as Cabin crew.  Relevant past medical, surgical, family and social history reviewed and updated as indicated. Interim medical history since our last visit reviewed. Allergies and medications reviewed and updated. Current Outpatient Prescriptions on File Prior to Visit  Medication Sig  . amLODipine (NORVASC) 10 MG tablet Take 10 mg by mouth 2 (two) times daily.  Marland Kitchen apixaban (ELIQUIS) 5 MG TABS tablet Take 1 tablet (5 mg total) by mouth 2 (two) times daily.  . cholecalciferol (VITAMIN D) 1000 UNITS tablet Take 1,000 Units by mouth daily.  . furosemide (LASIX) 20 MG tablet Take 1 tablet (20 mg total) by mouth daily.  Marland Kitchen levothyroxine (SYNTHROID, LEVOTHROID) 125 MCG tablet TAKE 1 TABLET BY MOUTH EVERY MORNING BEFORE BREAKFAST  . metoprolol tartrate (LOPRESSOR) 25 MG tablet Take 1 tablet (25 mg total) by mouth 2 (two) times daily.  . pantoprazole (PROTONIX) 40 MG tablet Take 1 tablet (40 mg total) by mouth daily.  . potassium chloride SA (K-DUR,KLOR-CON) 20 MEQ tablet  TAKE 1 TABLET BY MOUTH DAILY  . ramipril (ALTACE) 10 MG capsule Take 1 capsule (10 mg total) by mouth 2 (two) times daily.   No current facility-administered medications on file prior to visit.    Review of Systems Per HPI unless specifically indicated above     Objective:    BP 150/82 mmHg  Pulse 84  Temp(Src) 98.1 F (36.7 C) (Oral)  Wt 174 lb 12 oz (79.266 kg)  SpO2 95%  Wt Readings from Last 3 Encounters:  07/24/14 174 lb 12 oz (79.266 kg)  06/11/14 175 lb (79.379 kg)  05/01/14 171 lb (77.565 kg)    Physical Exam  Constitutional: She appears well-developed and well-nourished. No distress.  HENT:  Head: Normocephalic and atraumatic.  Right Ear: Hearing, tympanic membrane, external ear and ear canal normal.  Left Ear: Hearing, tympanic membrane, external ear and ear canal normal.  Nose: Mucosal edema present. No rhinorrhea. Right sinus exhibits no maxillary sinus tenderness and no frontal sinus tenderness. Left sinus exhibits no maxillary sinus tenderness and no frontal sinus tenderness.  Mouth/Throat: Uvula is midline, oropharynx is clear and moist and mucous membranes are normal. No oropharyngeal exudate, posterior oropharyngeal edema, posterior oropharyngeal erythema or tonsillar abscesses.  Eyes: Conjunctivae and EOM are normal. Pupils are equal, round, and reactive to light. No scleral icterus.  Neck: Normal range of motion. Neck supple.  Cardiovascular: Normal rate, regular rhythm, normal heart sounds and intact distal pulses.   No murmur heard. Pulmonary/Chest:  Effort normal. No respiratory distress. She has wheezes (mild faint coarse wheezing). She has rhonchi (scattered). She has rales (RLL).  Lymphadenopathy:    She has no cervical adenopathy.  Skin: Skin is warm and dry. No rash noted.  Nursing note and vitals reviewed.     Assessment & Plan:   Problem List Items Addressed This Visit    Right lower lobe pneumonia - Primary    CXR - haziness RLL concern for  RLL PNA as well. Will await final read but treat as CAP. Clinical RLL CAP - treat with rocephin IM 1gm and zpack.  Hycodan for cough at night time. Flu swab negative      Relevant Medications   azithromycin (ZITHROMAX) tablet   HYDROcodone-homatropine (HYCODAN) 5-1.5 MG/5ML syrup   Other Relevant Orders   DG Chest 2 View   POCT Influenza A/B (Completed)   History of lung abscess    Interestingly, there is some mention of h/o lung abscess in our EMR from 2000 - also patient prior worked at [employer]. Consider PPD placement at next CPE. Consider chest CT if persistent sxs.       Other Visit Diagnoses    Cough        Relevant Orders    POCT Influenza A/B (Completed)        Follow up plan: Return if symptoms worsen or fail to improve.

## 2014-07-24 NOTE — Progress Notes (Signed)
Pre visit review using our clinic review tool, if applicable. No additional management support is needed unless otherwise documented below in the visit note. 

## 2014-07-24 NOTE — Assessment & Plan Note (Addendum)
CXR - haziness RLL concern for RLL PNA as well. Will await final read but treat as CAP. Clinical RLL CAP - treat with rocephin IM 1gm and zpack.  Hycodan for cough at night time. Flu swab negative

## 2014-07-24 NOTE — Assessment & Plan Note (Addendum)
Interestingly, there is some mention of h/o lung abscess in our EMR from 2000 - also patient prior worked at [employer]. Consider PPD placement at next CPE. Consider chest CT if persistent sxs.

## 2014-07-24 NOTE — Addendum Note (Signed)
Addended by: Royann Shivers A on: 07/24/2014 03:30 PM   Modules accepted: Orders

## 2014-07-25 ENCOUNTER — Other Ambulatory Visit: Payer: Self-pay | Admitting: Family Medicine

## 2014-07-25 DIAGNOSIS — J181 Lobar pneumonia, unspecified organism: Principal | ICD-10-CM

## 2014-07-25 DIAGNOSIS — J189 Pneumonia, unspecified organism: Secondary | ICD-10-CM

## 2014-08-27 ENCOUNTER — Ambulatory Visit (INDEPENDENT_AMBULATORY_CARE_PROVIDER_SITE_OTHER)
Admission: RE | Admit: 2014-08-27 | Discharge: 2014-08-27 | Disposition: A | Discharge status: Home / Self Care | Payer: Commercial Managed Care - HMO | Source: Ambulatory Visit | Attending: Family Medicine | Admitting: Family Medicine

## 2014-08-27 DIAGNOSIS — J189 Pneumonia, unspecified organism: Secondary | ICD-10-CM | POA: Diagnosis not present

## 2014-08-27 DIAGNOSIS — J181 Lobar pneumonia, unspecified organism: Principal | ICD-10-CM

## 2014-09-16 ENCOUNTER — Other Ambulatory Visit: Payer: Self-pay | Admitting: Family Medicine

## 2014-09-18 ENCOUNTER — Other Ambulatory Visit: Payer: Self-pay | Admitting: Family Medicine

## 2014-10-30 ENCOUNTER — Telehealth: Payer: Self-pay | Admitting: Cardiovascular Disease

## 2014-10-30 NOTE — Telephone Encounter (Signed)
Closed encounter °

## 2014-10-31 ENCOUNTER — Other Ambulatory Visit: Payer: Self-pay | Admitting: Family Medicine

## 2014-10-31 DIAGNOSIS — Z1231 Encounter for screening mammogram for malignant neoplasm of breast: Secondary | ICD-10-CM

## 2014-10-31 DIAGNOSIS — H5210 Myopia, unspecified eye: Secondary | ICD-10-CM | POA: Diagnosis not present

## 2014-10-31 DIAGNOSIS — H524 Presbyopia: Secondary | ICD-10-CM | POA: Diagnosis not present

## 2014-11-01 ENCOUNTER — Encounter: Payer: Self-pay | Admitting: Cardiology

## 2014-11-01 ENCOUNTER — Ambulatory Visit (INDEPENDENT_AMBULATORY_CARE_PROVIDER_SITE_OTHER): Payer: Commercial Managed Care - HMO | Admitting: Cardiology

## 2014-11-01 VITALS — BP 140/94 | HR 72 | Ht 65.0 in | Wt 164.6 lb

## 2014-11-01 DIAGNOSIS — I10 Essential (primary) hypertension: Secondary | ICD-10-CM

## 2014-11-01 DIAGNOSIS — I482 Chronic atrial fibrillation, unspecified: Secondary | ICD-10-CM

## 2014-11-01 NOTE — Progress Notes (Signed)
. Patient ID: Joanna Hood, female   DOB: 07-16-1945, 69 y.o.   MRN: [id number]    11/01/2014 Joanna Hood   12/31/1945  [id number]  Primary Physician Ria Bush, MD Primary Cardiologist: Dr. Gwenlyn Found  Reason for Visit/CC: 6 month F/U for Atrial Fibrillation  HPI:  [name] is a 69 y/o AAF, followed by [name] since November 2014 when she was admitted for acute diastolic heart failure, secondary to severe hypertension, complicated by Afib w/ RVR. During that admission, w/u revealed normal coronaries on diagnostic cath and normal LV systolic function with an EF 50-55%. She ultimately spontaneously converted to NSR on IV Cardizem. However since that time, she developed recurrent atrial fibrillation that is now chronic. She is rate controlled on metoprolol. We started Eliquis, 5 mg BID, for stroke prophylaxis, as her CHCHA2DS2-VASc score is 4 (CHF, HTN, Age, Sex).   Her other past medical history is significant for HTN and hypothyroidism, treated with levothyroxine and is followed by her PCP.   She presents back to clinic today for her 6 month f/u. She states that she has been doing fairly well and denies any symptoms since her last visit. She reports daily medication compliance, including Eliquis. She denies any abnormal bleeding.   EKG done in office today demonstrates Afib with a controlled ventricular response, at 72 bpm.    Current Outpatient Prescriptions  Medication Sig Dispense Refill  . amLODipine (NORVASC) 10 MG tablet Take 10 mg by mouth 2 (two) times daily.    Marland Kitchen apixaban (ELIQUIS) 5 MG TABS tablet Take 1 tablet (5 mg total) by mouth 2 (two) times daily. 180 tablet 3  . cholecalciferol (VITAMIN D) 1000 UNITS tablet Take 1,000 Units by mouth daily.    . furosemide (LASIX) 20 MG tablet Take 1 tablet (20 mg total) by mouth daily. 90 tablet 3  . HYDROcodone-homatropine (HYCODAN) 5-1.5 MG/5ML syrup Take 5 mLs by mouth at bedtime as needed for cough. 160 mL 0  .  levothyroxine (SYNTHROID, LEVOTHROID) 125 MCG tablet TAKE 1 TABLET BY MOUTH EVERY MORNING BEFORE BREAKFAST 30 tablet 2  . metoprolol tartrate (LOPRESSOR) 25 MG tablet Take 1 tablet (25 mg total) by mouth 2 (two) times daily. 180 tablet 3  . pantoprazole (PROTONIX) 40 MG tablet Take 1 tablet (40 mg total) by mouth daily. 90 tablet 3  . potassium chloride SA (K-DUR,KLOR-CON) 20 MEQ tablet TAKE 1 TABLET BY MOUTH DAILY 90 tablet 3  . ramipril (ALTACE) 10 MG capsule Take 1 capsule (10 mg total) by mouth 2 (two) times daily. 180 capsule 3   No current facility-administered medications for this visit.    Allergies  Allergen Reactions  . Metronidazole     REACTION: Rash, itching    History   Social History  . Marital Status: Single    Spouse Name: N/A  . Number of Children: N/A  . Years of Education: N/A   Occupational History  . Not on file.   Social History Main Topics  . Smoking status: Former Smoker    Quit date: 05/16/1969  . Smokeless tobacco: Former Systems developer  . Alcohol Use: No  . Drug Use: No  . Sexual Activity: Not on file   Other Topics Concern  . 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  Cardiovascular: negative for chest pain, dyspnea on exertion, edema, orthopnea, palpitations, paroxysmal nocturnal dyspnea or shortness of breath Dermatological: negative for rash Respiratory: negative for cough or wheezing Urologic: negative for hematuria Abdominal: negative for nausea, vomiting, diarrhea, bright red blood per rectum, melena, or hematemesis Neurologic: negative for visual changes, syncope, or dizziness All other systems reviewed and are otherwise negative except as noted above.    Blood pressure 140/94, pulse 72, height 5\' 5"  (1.651 m),  weight 164 lb 9.6 oz (74.662 kg).  General appearance: alert, cooperative and no distress Neck: no carotid bruit and no JVD Lungs: clear to auscultation bilaterally Heart: irregularly irregular rhythm Extremities: no LEE Pulses: 2+ and symmetric Skin: warm and dry Neurologic: Grossly normal  EKG Atrial Fibrillation; HR 71 bpm  ASSESSMENT AND PLAN:   Chronic atrial fibrillation EKG today demonstrates Afib w/ a controlled ventricular response. She is asymptomatic. Ventricular rate on EKG is 72 bpm. Will continue current dose of Lopressor, 25 mg BID. She has a CHA2DS2-VASc score of 4 (CHF, HTN, Age, Sex), placing her at high risk for thromboembolic event. She will continue Eliquis, 5 mg BID, for stroke prophylaxis.  Chronic Anticoagulation Denies any abnormal bleeding. No fall history. Continue Eliquis BID.   HTN (hypertension), malignant Moderately elevated today at 140/94. She denies CP/SOB and HA. No signs/symptoms of acute diastolic HF. Continue Lopressor 25 mg BID.  Hypothyroidism On Synthroid. Continue management per PCP.    PLAN   f/u with Dr. Gwenlyn Found in 6 months   [place], [facility] 11/01/2014 4:00 PM

## 2014-11-01 NOTE — Patient Instructions (Signed)
Your physician wants you to follow-up in: 6 months with dr berry You will receive a reminder letter in the mail two months in advance. If you don't receive a letter, please call our office to schedule the follow-up appointment.

## 2014-11-13 ENCOUNTER — Encounter: Payer: Self-pay | Admitting: *Deleted

## 2014-11-13 ENCOUNTER — Ambulatory Visit (HOSPITAL_COMMUNITY)
Admission: RE | Admit: 2014-11-13 | Discharge: 2014-11-13 | Disposition: A | Discharge status: Home / Self Care | Payer: Commercial Managed Care - HMO | Source: Ambulatory Visit | Attending: Family Medicine | Admitting: Family Medicine

## 2014-11-13 DIAGNOSIS — Z1231 Encounter for screening mammogram for malignant neoplasm of breast: Secondary | ICD-10-CM | POA: Diagnosis not present

## 2014-11-21 ENCOUNTER — Other Ambulatory Visit: Payer: Self-pay

## 2014-11-21 ENCOUNTER — Other Ambulatory Visit: Payer: Self-pay | Admitting: Cardiovascular Disease

## 2014-11-21 MED ORDER — APIXABAN 5 MG PO TABS: 5.0000 mg | ORAL_TABLET | Freq: Two times a day (BID) | ORAL | Status: DC

## 2014-11-21 MED ORDER — POTASSIUM CHLORIDE CRYS ER 20 MEQ PO TBCR: EXTENDED_RELEASE_TABLET | ORAL | Status: DC

## 2014-11-21 MED ORDER — FUROSEMIDE 20 MG PO TABS: 20.0000 mg | ORAL_TABLET | Freq: Every day | ORAL | Status: DC

## 2014-11-21 MED ORDER — RAMIPRIL 10 MG PO CAPS: 10.0000 mg | ORAL_CAPSULE | Freq: Two times a day (BID) | ORAL | Status: DC

## 2014-11-21 MED ORDER — METOPROLOL TARTRATE 25 MG PO TABS: 25.0000 mg | ORAL_TABLET | Freq: Two times a day (BID) | ORAL | Status: DC

## 2014-11-21 MED ORDER — PANTOPRAZOLE SODIUM 40 MG PO TBEC: 40.0000 mg | DELAYED_RELEASE_TABLET | Freq: Every day | ORAL | Status: DC

## 2014-11-21 NOTE — Telephone Encounter (Signed)
[employer] will be sending over a request,so she can receive her medicine by mail order.They just wanted her to notify the office of this change.

## 2014-11-21 NOTE — Telephone Encounter (Signed)
Notification acknowledged.

## 2014-11-21 NOTE — Telephone Encounter (Signed)
Rx(s) sent to pharmacy electronically.  

## 2014-11-23 ENCOUNTER — Other Ambulatory Visit: Payer: Self-pay | Admitting: *Deleted

## 2014-11-23 MED ORDER — AMLODIPINE BESYLATE 10 MG PO TABS: ORAL_TABLET | ORAL | Status: DC

## 2014-11-23 MED ORDER — LEVOTHYROXINE SODIUM 125 MCG PO TABS: ORAL_TABLET | ORAL | Status: DC

## 2014-12-05 ENCOUNTER — Other Ambulatory Visit: Payer: Self-pay | Admitting: *Deleted

## 2015-01-09 ENCOUNTER — Encounter: Payer: Self-pay | Admitting: Internal Medicine

## 2015-01-28 ENCOUNTER — Other Ambulatory Visit: Payer: Self-pay | Admitting: Family Medicine

## 2015-04-23 ENCOUNTER — Ambulatory Visit (INDEPENDENT_AMBULATORY_CARE_PROVIDER_SITE_OTHER): Payer: Commercial Managed Care - HMO | Admitting: Family Medicine

## 2015-04-23 ENCOUNTER — Encounter: Payer: Self-pay | Admitting: Family Medicine

## 2015-04-23 VITALS — BP 136/84 | HR 68 | Temp 97.3°F | Ht 63.0 in | Wt 181.0 lb

## 2015-04-23 DIAGNOSIS — Z7189 Other specified counseling: Secondary | ICD-10-CM

## 2015-04-23 DIAGNOSIS — Z860100 Personal history of colon polyps, unspecified: Secondary | ICD-10-CM

## 2015-04-23 DIAGNOSIS — I4819 Other persistent atrial fibrillation: Secondary | ICD-10-CM

## 2015-04-23 DIAGNOSIS — I10 Essential (primary) hypertension: Secondary | ICD-10-CM | POA: Diagnosis not present

## 2015-04-23 DIAGNOSIS — E039 Hypothyroidism, unspecified: Secondary | ICD-10-CM

## 2015-04-23 DIAGNOSIS — R718 Other abnormality of red blood cells: Secondary | ICD-10-CM | POA: Diagnosis not present

## 2015-04-23 DIAGNOSIS — E559 Vitamin D deficiency, unspecified: Secondary | ICD-10-CM

## 2015-04-23 DIAGNOSIS — E042 Nontoxic multinodular goiter: Secondary | ICD-10-CM

## 2015-04-23 DIAGNOSIS — Z23 Encounter for immunization: Secondary | ICD-10-CM

## 2015-04-23 DIAGNOSIS — M8580 Other specified disorders of bone density and structure, unspecified site: Secondary | ICD-10-CM

## 2015-04-23 DIAGNOSIS — Z0000 Encounter for general adult medical examination without abnormal findings: Secondary | ICD-10-CM | POA: Insufficient documentation

## 2015-04-23 LAB — CBC WITH DIFFERENTIAL/PLATELET
BASOS PCT: 0.7 % (ref 0.0–3.0)
Basophils Absolute: 0 10*3/uL (ref 0.0–0.1)
Eosinophils Absolute: 0.1 10*3/uL (ref 0.0–0.7)
Eosinophils Relative: 1.5 % (ref 0.0–5.0)
HEMATOCRIT: 38.9 % (ref 36.0–46.0)
Hemoglobin: 12.3 g/dL (ref 12.0–15.0)
LYMPHS PCT: 25.2 % (ref 12.0–46.0)
Lymphs Abs: 1.3 10*3/uL (ref 0.7–4.0)
MCHC: 31.6 g/dL (ref 30.0–36.0)
MCV: 76.1 fl — AB (ref 78.0–100.0)
MONOS PCT: 9.1 % (ref 3.0–12.0)
Monocytes Absolute: 0.5 10*3/uL (ref 0.1–1.0)
NEUTROS ABS: 3.3 10*3/uL (ref 1.4–7.7)
Neutrophils Relative %: 63.5 % (ref 43.0–77.0)
PLATELETS: 185 10*3/uL (ref 150.0–400.0)
RBC: 5.11 Mil/uL (ref 3.87–5.11)
RDW: 15 % (ref 11.5–15.5)
WBC: 5.3 10*3/uL (ref 4.0–10.5)

## 2015-04-23 LAB — COMPREHENSIVE METABOLIC PANEL
ALT: 16 U/L (ref 0–35)
AST: 22 U/L (ref 0–37)
Albumin: 4.1 g/dL (ref 3.5–5.2)
Alkaline Phosphatase: 130 U/L — ABNORMAL HIGH (ref 39–117)
BUN: 12 mg/dL (ref 6–23)
CO2: 31 meq/L (ref 19–32)
Calcium: 9.8 mg/dL (ref 8.4–10.5)
Chloride: 102 mEq/L (ref 96–112)
Creatinine, Ser: 0.83 mg/dL (ref 0.40–1.20)
GFR: 87.95 mL/min (ref >60.00)
GLUCOSE: 108 mg/dL — AB (ref 70–99)
POTASSIUM: 3.6 meq/L (ref 3.5–5.1)
SODIUM: 140 meq/L (ref 135–145)
Total Bilirubin: 0.7 mg/dL (ref 0.2–1.2)
Total Protein: 8 g/dL (ref 6.0–8.3)

## 2015-04-23 LAB — TSH: TSH: 5.74 u[IU]/mL — ABNORMAL HIGH (ref 0.35–4.50)

## 2015-04-23 LAB — VITAMIN D 25 HYDROXY (VIT D DEFICIENCY, FRACTURES): VITD: 38.61 ng/mL (ref 30.00–100.00)

## 2015-04-23 LAB — T4, FREE: Free T4: 0.97 ng/dL (ref 0.60–1.60)

## 2015-04-23 NOTE — Assessment & Plan Note (Signed)
Reviewed vit D dosing. Compliant with 1000 IU daily.

## 2015-04-23 NOTE — Assessment & Plan Note (Signed)
Chronic, stable. Continue current regimen. 

## 2015-04-23 NOTE — Progress Notes (Signed)
BP 136/84 mmHg  Pulse 68  Temp(Src) 97.3 F (36.3 C) (Oral)  Ht 5\' 3"  (1.6 m)  Wt 181 lb (82.101 kg)  BMI 32.07 kg/m2   CC: medicare wellness visit  Subjective:    Patient ID: Joanna Hood, female    DOB: June 16, 1946, 69 y.o.   MRN: [id number]  HPI: [name] is a 69 y.o. female presenting on 04/23/2015 for Annual Exam and Referral   Passes hearing screen today. Vision screen with eye doctor. Denies falls or depression or anhedonia.  Preventative:  Colonoscopy 1 polyp 2011 (Dr. Henrene Pastor). rec rpt 5 yrs. Well woman exam - partial hysterectomy 1970, ovaries remain.All normal pap smears.Declines this year. Mammogram 10/2014 Birads 1 DEXA 12/2013 - pt gets calcium/vitamin D daily.  Flu shot - yearly Pneumovax 04/2013, prevnar today Td 2005 zostavax - not interested Advanced directives: HCPOA would be daughter. Would like advanced directive packet.  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 Interim medical history since our last visit reviewed. Allergies and medications reviewed and updated. Current Outpatient Prescriptions on File Prior to Visit  Medication Sig  . amLODipine (NORVASC) 10 MG tablet Take one tablet daily **MUST HAVE PHYSICAL FOR FURTHER REFILLS**  . apixaban (ELIQUIS) 5 MG TABS tablet Take 1 tablet (5 mg total) by mouth 2 (two) times daily.  . cholecalciferol (VITAMIN D) 1000 UNITS tablet Take 1,000 Units by mouth daily.  . furosemide (LASIX) 20 MG tablet Take 1 tablet (20 mg total) by mouth daily.  Marland Kitchen levothyroxine (SYNTHROID, LEVOTHROID) 125 MCG tablet TAKE 1 TABLET BY MOUTH EVERY MORNING BEFORE BREAKFAST **MUST HAVE PHYSICAL FOR FURTHER  REFILLS**  . metoprolol tartrate (LOPRESSOR) 25 MG tablet Take 1 tablet (25 mg total) by mouth 2 (two) times daily.  . pantoprazole (PROTONIX) 40 MG tablet Take 1 tablet (40 mg total) by mouth daily.  . potassium chloride SA (K-DUR,KLOR-CON) 20 MEQ tablet TAKE 1 TABLET BY MOUTH DAILY  . ramipril (ALTACE) 10 MG capsule Take 1 capsule (10 mg total) by mouth 2 (two) times daily.   No current facility-administered medications on file prior to visit.    Review of Systems  Constitutional: Negative for fever, chills, activity change, appetite change, fatigue and unexpected weight change.  HENT: Negative for hearing loss.   Eyes: Negative for visual disturbance.  Respiratory: Negative for cough, chest tightness, shortness of breath and wheezing.   Cardiovascular: Negative for chest pain, palpitations and leg swelling.  Gastrointestinal: Negative for nausea, vomiting, abdominal pain, diarrhea, constipation, blood in stool and abdominal distention.  Genitourinary: Negative for hematuria and difficulty urinating.  Musculoskeletal: Negative for myalgias, arthralgias and neck pain.  Skin: Negative for rash.  Neurological: Negative for dizziness, seizures, syncope and headaches.  Hematological: Negative for adenopathy. Does not bruise/bleed easily.  Psychiatric/Behavioral: Negative for dysphoric mood. The patient is not nervous/anxious.    Per HPI unless specifically indicated in ROS section     Objective:    BP 136/84 mmHg  Pulse 68  Temp(Src) 97.3 F (36.3 C) (Oral)  Ht 5\' 3"  (1.6 m)  Wt 181 lb (82.101 kg)  BMI 32.07 kg/m2  Wt Readings from Last 3 Encounters:  04/23/15 181 lb (82.101 kg)  11/01/14 164 lb 9.6 oz (74.662 kg)  07/24/14 174 lb 12 oz (79.266 kg)    Physical Exam  Constitutional: She is oriented to person, place, and time. She appears well-developed and well-nourished. No distress.  HENT:  Head: Normocephalic and atraumatic.  Right Ear: Hearing, tympanic membrane, external  ear and ear canal normal.  Left Ear: Hearing, tympanic membrane, external ear and ear canal normal.  Nose: Nose normal.  Mouth/Throat: Uvula is midline, oropharynx is clear and moist and mucous membranes are normal. No oropharyngeal exudate, posterior oropharyngeal edema or posterior oropharyngeal erythema.  Eyes: Conjunctivae and EOM are normal. Pupils are equal, round, and reactive to light. No scleral icterus.  Neck: Normal range of motion. Neck supple. Carotid bruit is present (radiation of cardiac murmur). No thyromegaly present.  Cardiovascular: Normal rate and intact distal pulses.  An irregularly irregular rhythm present.  Murmur (3/6 murmur best at LUSB) heard. Pulses:      Radial pulses are 2+ on the right side, and 2+ on the left side.  Pulmonary/Chest: Effort normal and breath sounds normal. No respiratory distress. She has no wheezes. She has no rales.  Breast exam - deferred  Abdominal: Soft. Bowel sounds are normal. She exhibits no distension and no mass. There is no tenderness. There is no rebound and no guarding.  Genitourinary:  GYN - declined  Musculoskeletal: Normal range of motion. She exhibits no edema.  Lymphadenopathy:    She has no cervical adenopathy.  Neurological: She is alert and oriented to person, place, and time.  CN grossly intact, station and gait intact Recall 2/3, 3/3 with cue Calculation 5/5 serial 3s  Skin: Skin is warm and dry. No rash noted.  Psychiatric: She has a normal mood and affect. Her behavior is normal. Judgment and thought content normal.  Nursing note and vitals reviewed.     Assessment & Plan:   Problem List Items Addressed This Visit    Vitamin D deficiency    Reviewed vit D dosing. Compliant with 1000 IU daily.      Relevant Orders   Vit D  25 hydroxy (rtn osteoporosis monitoring)   Persistent atrial fibrillation (HCC)    Stable on eliquis. Continue f/u with cardiology.      Relevant Orders   Ambulatory referral to  Cardiology   Osteopenia    Reviewed calcium/vit D recommendations, encouraged get most of this from dietary sources.       Microcytosis    Recheck CBC today - ?thalassemia history      Relevant Orders   CBC with Differential/Platelet   Medicare annual wellness visit, subsequent - Primary    I have personally reviewed the Medicare Annual Wellness questionnaire and have noted 1. The patient's medical and social history 2. Their use of alcohol, tobacco or illicit drugs 3. Their current medications and supplements 4. The patient's functional ability including ADL's, fall risks, home safety risks and hearing or visual impairment. Cognitive function has been assessed and addressed as indicated.  5. Diet and physical activity 6. Evidence for depression or mood disorders The patients weight, height, BMI have been recorded in the chart. I have made referrals, counseling and provided education to the patient based on review of the above and I have provided the pt with a written personalized care plan for preventive services. Provider list updated.. See scanned questionairre as needed for further documentation. Reviewed preventative protocols and updated unless pt declined.       Hypothyroidism    Check TSH today.       Relevant  Orders   TSH   T4, free   HTN (hypertension), malignant    Chronic, stable. Continue current regimen.      Relevant Orders   Comprehensive metabolic panel   Health maintenance examination    Preventative protocols reviewed and updated unless pt declined. Discussed healthy diet and lifestyle.       GOITER, MULTINODULAR   COLONIC POLYPS, ADENOMATOUS, HX OF    Will refer back to GI.      Relevant Orders   Ambulatory referral to Gastroenterology   Advanced care planning/counseling discussion    Advanced directives: HCPOA would be daughter. Would like advanced directive packet.           Follow up plan: Return in about 1 year (around 04/22/2016), or  as needed, for medicare wellness visit.

## 2015-04-23 NOTE — Assessment & Plan Note (Signed)
Stable on eliquis. Continue f/u with cardiology.

## 2015-04-23 NOTE — Progress Notes (Signed)
Pre visit review using our clinic review tool, if applicable. No additional management support is needed unless otherwise documented below in the visit note. 

## 2015-04-23 NOTE — Assessment & Plan Note (Signed)
Will refer back to GI 

## 2015-04-23 NOTE — Assessment & Plan Note (Addendum)
Reviewed calcium/vit D recommendations, encouraged get most of this from dietary sources.

## 2015-04-23 NOTE — Assessment & Plan Note (Signed)
Advanced directives: HCPOA would be daughter. Would like advanced directive packet.

## 2015-04-23 NOTE — Assessment & Plan Note (Signed)

## 2015-04-23 NOTE — Assessment & Plan Note (Signed)
Recheck CBC today - ?thalassemia history

## 2015-04-23 NOTE — Assessment & Plan Note (Signed)
Preventative protocols reviewed and updated unless pt declined. Discussed healthy diet and lifestyle.  

## 2015-04-23 NOTE — Patient Instructions (Addendum)
We will refer you to GI.  Prevnar today. labwork today. Advanced directive packet provided today. Return as needed or in 1 year for next wellness visit

## 2015-04-23 NOTE — Addendum Note (Signed)
Addended by: Daralene Milch C on: 04/23/2015 04:06 PM   Modules accepted: Miquel Dunn

## 2015-04-23 NOTE — Assessment & Plan Note (Signed)
Check TSH today

## 2015-04-23 NOTE — Addendum Note (Signed)
Addended by: Royann Shivers A on: 04/23/2015 11:24 AM   Modules accepted: Orders

## 2015-04-24 ENCOUNTER — Encounter: Payer: Self-pay | Admitting: Internal Medicine

## 2015-04-26 DIAGNOSIS — Z0100 Encounter for examination of eyes and vision without abnormal findings: Secondary | ICD-10-CM | POA: Diagnosis not present

## 2015-04-28 ENCOUNTER — Other Ambulatory Visit: Payer: Self-pay | Admitting: Family Medicine

## 2015-04-28 DIAGNOSIS — E039 Hypothyroidism, unspecified: Secondary | ICD-10-CM

## 2015-04-28 MED ORDER — LEVOTHYROXINE SODIUM 137 MCG PO TABS: ORAL_TABLET | ORAL | Status: DC

## 2015-04-29 ENCOUNTER — Encounter: Payer: Self-pay | Admitting: *Deleted

## 2015-05-03 ENCOUNTER — Encounter: Payer: Self-pay | Admitting: Cardiology

## 2015-05-03 ENCOUNTER — Ambulatory Visit (INDEPENDENT_AMBULATORY_CARE_PROVIDER_SITE_OTHER): Payer: Commercial Managed Care - HMO | Admitting: Cardiology

## 2015-05-03 VITALS — BP 158/70 | HR 65 | Ht 63.0 in | Wt 184.0 lb

## 2015-05-03 DIAGNOSIS — R011 Cardiac murmur, unspecified: Secondary | ICD-10-CM | POA: Diagnosis not present

## 2015-05-03 NOTE — Progress Notes (Signed)
05/03/2015 Joanna Hood   [birth year]  [id number]  Primary Physician Joanna Bush, MD Primary Cardiologist: Dr. Gwenlyn Hood   Reason for Visit/CC: Routine 6 month follow-up for Chronic Atrial Fibrillation  HPI:  Joanna Hood is a 69 y/o AAF, followed by [facility] since November 2014 when she was admitted for acute diastolic heart failure, secondary to severe hypertension, complicated by Afib w/ RVR. During that admission, w/u revealed normal coronaries on diagnostic cath and normal LV systolic function with an EF 50-55%. She ultimately spontaneously converted to NSR on IV Cardizem. However since that time, she developed recurrent atrial fibrillation that is now chronic. She is rate controlled on metoprolol. We started Eliquis, 5 mg BID, for stroke prophylaxis, as her CHCHA2DS2-VASc score is 4 (CHF, HTN, Age, Sex). Her other past medical history is significant for HTN and hypothyroidism, treated with levothyroxine and is followed by her PCP.   She presents back to clinic today for her 6 month f/u. She states that she has been doing fairly well and denies any symptoms since her last visit. She reports daily medication compliance, including Eliquis. She denies any abnormal bleeding. No falls. She recently saw her PCP, Joanna Hood, on 04/23/2015. He obtained basic laboratory work. CBC was negative for anemia with a hemoglobin of 12. Renal function was also normal with a serum creatinine level of  0.83.  She reports that she has done well in regards to her atrial fibrillation. She denies any symptoms of palpitations, dyspnea, chest discomfort, dizziness, lightheadedness, fatigue, syncope/near-syncope. Her EKG today shows chronic atrial fibrillation with a controlled ventricular rate of 65 bpm. Her weight has also remained stable and she denies an dyspnea, LEE, orthopnea or PND.    Current Outpatient Prescriptions  Medication Sig Dispense Refill  . amLODipine (NORVASC) 10 MG tablet Take  one tablet daily **MUST HAVE PHYSICAL FOR FURTHER REFILLS** 90 tablet 0  . apixaban (ELIQUIS) 5 MG TABS tablet Take 1 tablet (5 mg total) by mouth 2 (two) times daily. 180 tablet 3  . Calcium Carb-Cholecalciferol (CALCIUM-VITAMIN D) 600-400 MG-UNIT TABS Take 1 tablet by mouth daily.    . cholecalciferol (VITAMIN D) 1000 UNITS tablet Take 1,000 Units by mouth daily.    . furosemide (LASIX) 20 MG tablet Take 1 tablet (20 mg total) by mouth daily. 90 tablet 3  . levothyroxine (SYNTHROID, LEVOTHROID) 137 MCG tablet TAKE 1 TABLET BY MOUTH EVERY MORNING BEFORE BREAKFAST 90 tablet 3  . metoprolol tartrate (LOPRESSOR) 25 MG tablet Take 1 tablet (25 mg total) by mouth 2 (two) times daily. 180 tablet 3  . pantoprazole (PROTONIX) 40 MG tablet Take 1 tablet (40 mg total) by mouth daily. 90 tablet 3  . potassium chloride SA (K-DUR,KLOR-CON) 20 MEQ tablet TAKE 1 TABLET BY MOUTH DAILY 90 tablet 3  . ramipril (ALTACE) 10 MG capsule Take 1 capsule (10 mg total) by mouth 2 (two) times daily. 180 capsule 3   No current facility-administered medications for this visit.    Allergies  Allergen Reactions  . Metronidazole     REACTION: Rash, itching    Social History   Social History  . Marital Status: Single    Spouse Name: N/A  . Number of Children: N/A  . Years of Education: N/A   Occupational History  . Not on file.   Social History Main Topics  . Smoking status: Former Smoker    Quit date: 05/16/1969  . Smokeless tobacco: Former Systems developer  . Alcohol Use: No  .  Drug Use: No  . Sexual Activity: Not on file   Other Topics Concern  . 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  Cardiovascular: negative for chest pain, dyspnea on exertion, edema, orthopnea, palpitations,  paroxysmal nocturnal dyspnea or shortness of breath Dermatological: negative for rash Respiratory: negative for cough or wheezing Urologic: negative for hematuria Abdominal: negative for nausea, vomiting, diarrhea, bright red blood per rectum, melena, or hematemesis Neurologic: negative for visual changes, syncope, or dizziness All other systems reviewed and are otherwise negative except as noted above.    Blood pressure 158/70, pulse 65, height 5\' 3"  (1.6 m), weight 184 lb (83.462 kg).  General appearance: alert, cooperative, no distress and dentition in poor repair Neck: no carotid bruit and no JVD Lungs: clear to auscultation bilaterally Heart: irregularly irregular rhythm and 2/6 SM at LUSB Extremities: no LEE Pulses: 2+ and symmetric Skin: warm and dry Neurologic: Grossly normal  EKG chronic atrial fibrillation, 65 bpm. Chronic ST & Twave abnormalities in inferior leads (also seen on 04/2013 EKG when cath revealed normal coronaries.   ASSESSMENT AND PLAN:    Chronic atrial fibrillation EKG today demonstrates Afib w/ a controlled ventricular response. She is asymptomatic. Ventricular rate on EKG is 65 bpm. Will continue current dose of Lopressor, 25 mg BID. She has a CHA2DS2-VASc score of 4 (CHF, HTN, Age, Sex), placing her at high risk for thromboembolic event. She will continue Eliquis, 5 mg BID, for stroke prophylaxis.  Chronic Anticoagulation Denies any abnormal bleeding. No fall history. Recent CBC 04/23/15 showed stable hemoglobin of 12. Continue Eliquis BID.   HTN (hypertension), malignant Moderately elevated today at 140/94. She denies CP/SOB and HA. No signs/symptoms of acute diastolic HF. Continue Lopressor 25 mg BID.  Hypothyroidism On Synthroid. Continue management per PCP.   Murmur Patient has new 2/6 SM at LUSB, not mentioned in previous documentation. Her last 2D echo in 2014 showed mild MR but no other valvular disease. Will obtain a repeat 2D echo to further  evaluate.   Chronic Diastolic CHF Weight is stable. No dyspnea, orthopnea, PND or lower chimney edema. Recent basic metabolic panel [id number] revealed stable serum creatinine and stable electrolytes. Continue daily Lasix. We discussed sodium restriction.   PLAN   Continue current plan of care. F/u with Dr. Gwenlyn Hood in 6 months   Rosita Fire [facility] PA-C 05/03/2015 9:09 AM

## 2015-05-03 NOTE — Patient Instructions (Signed)
Medication Instructions:   Your physician recommends that you continue on your current medications as directed. Please refer to the Current Medication list given to you today.    If you need a refill on your cardiac medications before your next appointment, please call your pharmacy.  Labwork: NONE ORDER TODAY    Testing/Procedures:  Your physician has requested that you have an echocardiogram. Echocardiography is a painless test that uses sound waves to create images of your heart. It provides your doctor with information about the size and shape of your heart and how well your heart's chambers and valves are working. This procedure takes approximately one hour. There are no restrictions for this procedure.   Follow-Up:  BASED UPON RESULTS   Any Other Special Instructions Will Be Listed Below (If Applicable).

## 2015-05-08 ENCOUNTER — Other Ambulatory Visit: Payer: Self-pay

## 2015-05-08 ENCOUNTER — Ambulatory Visit (HOSPITAL_COMMUNITY): Payer: Commercial Managed Care - HMO | Attending: Internal Medicine

## 2015-05-08 DIAGNOSIS — I071 Rheumatic tricuspid insufficiency: Secondary | ICD-10-CM | POA: Diagnosis not present

## 2015-05-08 DIAGNOSIS — R011 Cardiac murmur, unspecified: Secondary | ICD-10-CM | POA: Insufficient documentation

## 2015-05-08 DIAGNOSIS — I340 Nonrheumatic mitral (valve) insufficiency: Secondary | ICD-10-CM | POA: Diagnosis not present

## 2015-05-08 DIAGNOSIS — Z87891 Personal history of nicotine dependence: Secondary | ICD-10-CM | POA: Insufficient documentation

## 2015-05-08 DIAGNOSIS — I10 Essential (primary) hypertension: Secondary | ICD-10-CM | POA: Insufficient documentation

## 2015-05-08 DIAGNOSIS — I358 Other nonrheumatic aortic valve disorders: Secondary | ICD-10-CM | POA: Insufficient documentation

## 2015-05-08 DIAGNOSIS — I517 Cardiomegaly: Secondary | ICD-10-CM | POA: Insufficient documentation

## 2015-06-03 ENCOUNTER — Other Ambulatory Visit: Payer: Self-pay | Admitting: Family Medicine

## 2015-07-01 ENCOUNTER — Ambulatory Visit: Payer: Commercial Managed Care - HMO | Admitting: Internal Medicine

## 2015-07-29 ENCOUNTER — Other Ambulatory Visit (INDEPENDENT_AMBULATORY_CARE_PROVIDER_SITE_OTHER): Payer: Commercial Managed Care - HMO

## 2015-07-29 DIAGNOSIS — E039 Hypothyroidism, unspecified: Secondary | ICD-10-CM | POA: Diagnosis not present

## 2015-07-29 LAB — TSH: TSH: 4.85 u[IU]/mL — AB (ref 0.35–4.50)

## 2015-08-01 ENCOUNTER — Other Ambulatory Visit: Payer: Self-pay | Admitting: Family Medicine

## 2015-08-01 MED ORDER — LEVOTHYROXINE SODIUM 150 MCG PO TABS: ORAL_TABLET | ORAL | Status: DC

## 2015-08-07 ENCOUNTER — Other Ambulatory Visit: Payer: Self-pay | Admitting: *Deleted

## 2015-08-07 ENCOUNTER — Telehealth: Payer: Self-pay | Admitting: Family Medicine

## 2015-08-07 MED ORDER — LEVOTHYROXINE SODIUM 150 MCG PO TABS: ORAL_TABLET | ORAL | Status: DC

## 2015-08-07 NOTE — Telephone Encounter (Signed)
Pt returned your call.  

## 2015-08-07 NOTE — Telephone Encounter (Signed)
Spoke with patient.

## 2015-08-28 ENCOUNTER — Ambulatory Visit: Payer: Commercial Managed Care - HMO | Admitting: Internal Medicine

## 2015-09-16 ENCOUNTER — Other Ambulatory Visit: Payer: Self-pay | Admitting: Cardiovascular Disease

## 2015-09-22 ENCOUNTER — Encounter (HOSPITAL_COMMUNITY): Payer: Self-pay | Admitting: *Deleted

## 2015-09-22 ENCOUNTER — Emergency Department (HOSPITAL_COMMUNITY)
Admission: EM | Admit: 2015-09-22 | Discharge: 2015-09-22 | Disposition: A | Discharge status: Home / Self Care | Payer: Commercial Managed Care - HMO | Attending: Emergency Medicine | Admitting: Emergency Medicine

## 2015-09-22 ENCOUNTER — Emergency Department (HOSPITAL_COMMUNITY): Payer: Commercial Managed Care - HMO

## 2015-09-22 DIAGNOSIS — Z87891 Personal history of nicotine dependence: Secondary | ICD-10-CM | POA: Diagnosis not present

## 2015-09-22 DIAGNOSIS — Z79899 Other long term (current) drug therapy: Secondary | ICD-10-CM | POA: Insufficient documentation

## 2015-09-22 DIAGNOSIS — Z8701 Personal history of pneumonia (recurrent): Secondary | ICD-10-CM | POA: Diagnosis not present

## 2015-09-22 DIAGNOSIS — I10 Essential (primary) hypertension: Secondary | ICD-10-CM | POA: Diagnosis not present

## 2015-09-22 DIAGNOSIS — R011 Cardiac murmur, unspecified: Secondary | ICD-10-CM | POA: Insufficient documentation

## 2015-09-22 DIAGNOSIS — Z86010 Personal history of colonic polyps: Secondary | ICD-10-CM | POA: Insufficient documentation

## 2015-09-22 DIAGNOSIS — E876 Hypokalemia: Secondary | ICD-10-CM | POA: Insufficient documentation

## 2015-09-22 DIAGNOSIS — I5032 Chronic diastolic (congestive) heart failure: Secondary | ICD-10-CM | POA: Insufficient documentation

## 2015-09-22 DIAGNOSIS — E785 Hyperlipidemia, unspecified: Secondary | ICD-10-CM | POA: Insufficient documentation

## 2015-09-22 DIAGNOSIS — M25512 Pain in left shoulder: Secondary | ICD-10-CM | POA: Diagnosis not present

## 2015-09-22 LAB — URINE MICROSCOPIC-ADD ON

## 2015-09-22 LAB — URINALYSIS, ROUTINE W REFLEX MICROSCOPIC
Bilirubin Urine: NEGATIVE
Glucose, UA: NEGATIVE mg/dL
Ketones, ur: 15 mg/dL — AB
Leukocytes, UA: NEGATIVE
Nitrite: NEGATIVE
Protein, ur: NEGATIVE mg/dL
Specific Gravity, Urine: 1.007 (ref 1.005–1.030)
pH: 7 (ref 5.0–8.0)

## 2015-09-22 LAB — I-STAT CHEM 8, ED
BUN: 10 mg/dL (ref 6–20)
CHLORIDE: 102 mmol/L (ref 101–111)
Calcium, Ion: 1.08 mmol/L — ABNORMAL LOW (ref 1.13–1.30)
Creatinine, Ser: 0.7 mg/dL (ref 0.44–1.00)
GLUCOSE: 120 mg/dL — AB (ref 65–99)
HEMATOCRIT: 40 % (ref 36.0–46.0)
HEMOGLOBIN: 13.6 g/dL (ref 12.0–15.0)
POTASSIUM: 2.9 mmol/L — AB (ref 3.5–5.1)
Sodium: 140 mmol/L (ref 135–145)
TCO2: 24 mmol/L (ref 0–100)

## 2015-09-22 LAB — I-STAT TROPONIN, ED: Troponin i, poc: 0 ng/mL (ref 0.00–0.08)

## 2015-09-22 MED ORDER — HYDROCODONE-ACETAMINOPHEN 5-325 MG PO TABS
2.0000 | ORAL_TABLET | Freq: Once | ORAL | Status: AC
  Administered 2015-09-22: 2 via ORAL
  Filled 2015-09-22: qty 2

## 2015-09-22 MED ORDER — POTASSIUM CHLORIDE ER 20 MEQ PO TBCR: 20.0000 meq | EXTENDED_RELEASE_TABLET | Freq: Every day | ORAL | Status: DC

## 2015-09-22 MED ORDER — HYDROCODONE-ACETAMINOPHEN 5-325 MG PO TABS: 1.0000 | ORAL_TABLET | Freq: Four times a day (QID) | ORAL | Status: DC | PRN

## 2015-09-22 MED ORDER — POTASSIUM CHLORIDE CRYS ER 20 MEQ PO TBCR
40.0000 meq | EXTENDED_RELEASE_TABLET | Freq: Once | ORAL | Status: AC
  Administered 2015-09-22: 40 meq via ORAL
  Filled 2015-09-22: qty 2

## 2015-09-22 NOTE — ED Notes (Signed)
Pt went to X-ray.   

## 2015-09-22 NOTE — ED Provider Notes (Signed)
TIME SEEN: 6:10 AM  CHIEF COMPLAINT: Left shoulder pain  HPI: Pt is a 70 y.o. right-hand-dominant female with history of CHF, atrial fibrillation, hypothyroidism, hypertension who presents to the emergency department with left shoulder pain that started around 4 PM yesterday. States pain is worse when she tries to lift her shoulder. Denies chest pain or shortness of breath. No nausea, vomiting or diaphoresis. No dizziness. States she has had a previous history of a catheterization 2 years ago but did not require stents. No history of injury to the arm. No numbness, tingling or focal weakness. Has never had problems with the shoulder before.  ROS: See HPI Constitutional: no fever  Eyes: no drainage  ENT: no runny nose   Cardiovascular:  no chest pain  Resp: no SOB  GI: no vomiting GU: no dysuria Integumentary: no rash  Allergy: no hives  Musculoskeletal: no leg swelling  Neurological: no slurred speech ROS otherwise negative  PAST MEDICAL HISTORY/PAST SURGICAL HISTORY:  Past Medical History  Diagnosis Date  . Malignant hypertension longstanding  . Hypothyroidism   . SOB (shortness of breath) 04/29/2013  . HLD (hyperlipidemia)   . Chronic diastolic CHF (congestive heart failure) (HCC)     HTN  . Atrial fibrillation with RVR ([place]) 2014    persistent  . History of chicken pox   . History of colon polyps     benign  . Heart murmur   . History of anemia     unclear cause, now resolved  . Osteopenia 12/2013    T -2.3 hip  . Right lower lobe pneumonia 07/24/2014  . Vitamin D deficiency   . Microcytosis   . Goiter     MEDICATIONS:  Prior to Admission medications   Medication Sig Start Date End Date Taking? Authorizing Provider  amLODipine (NORVASC) 10 MG tablet TAKE 1 TABLET DAILY **MUST HAVE PHYSICAL FOR FURTHER REFILLS** 06/03/15  Yes Ria Bush, MD  apixaban (ELIQUIS) 5 MG TABS tablet Take 1 tablet (5 mg total) by mouth 2 (two) times daily. 11/21/14  Yes Lorretta Harp,  MD  Calcium Carb-Cholecalciferol (CALCIUM-VITAMIN D) 600-400 MG-UNIT TABS Take 1 tablet by mouth daily.   Yes Historical Provider, MD  cholecalciferol (VITAMIN D) 1000 UNITS tablet Take 1,000 Units by mouth daily.   Yes Historical Provider, MD  furosemide (LASIX) 20 MG tablet Take 1 tablet (20 mg total) by mouth daily. 11/21/14  Yes Lorretta Harp, MD  levothyroxine (SYNTHROID, LEVOTHROID) 150 MCG tablet TAKE 1 TABLET BY MOUTH EVERY MORNING BEFORE BREAKFAST 08/07/15  Yes Ria Bush, MD  metoprolol tartrate (LOPRESSOR) 25 MG tablet Take 1 tablet (25 mg total) by mouth 2 (two) times daily. 11/21/14  Yes Lorretta Harp, MD  pantoprazole (PROTONIX) 40 MG tablet Take 1 tablet (40 mg total) by mouth daily. 11/21/14  Yes Lorretta Harp, MD  potassium chloride SA (K-DUR,KLOR-CON) 20 MEQ tablet TAKE 1 TABLET EVERY DAY 09/16/15  Yes Lorretta Harp, MD  ramipril (ALTACE) 10 MG capsule Take 1 capsule (10 mg total) by mouth 2 (two) times daily. 11/21/14  Yes Lorretta Harp, MD    ALLERGIES:  Allergies  Allergen Reactions  . Metronidazole     REACTION: Rash, itching    SOCIAL HISTORY:  Social History  Substance Use Topics  . Smoking status: Former Smoker    Quit date: 05/16/1969  . Smokeless tobacco: Former Systems developer  . Alcohol Use: No    FAMILY HISTORY: Family History  Problem Relation Age of Onset  .  Cancer Mother     ovarian  . Cancer Maternal Grandmother     lung  . Diabetes Sister   . Hypertension Sister   . Hypertension Cousin   . Sudden death Mother   . CAD Neg Hx   . Stroke Neg Hx     EXAM: BP 172/82 mmHg  Pulse 97  Temp(Src) 98.1 F (36.7 C) (Oral)  Resp 20  SpO2 98% CONSTITUTIONAL: Alert and oriented and responds appropriately to questions. Well-appearing; well-nourished, Pleasant, smiling, in no distress HEAD: Normocephalic EYES: Conjunctivae clear, PERRL ENT: normal nose; no rhinorrhea; moist mucous membranes NECK: Supple, no meningismus, no LAD  CARD: Irregularly  irregular and intermittently tachycardic; S1 and S2 appreciated; no murmurs, no clicks, no rubs, no gallops RESP: Normal chest excursion without splinting or tachypnea; breath sounds clear and equal bilaterally; no wheezes, no rhonchi, no rales, no hypoxia or respiratory distress, speaking full sentences ABD/GI: Normal bowel sounds; non-distended; soft, non-tender, no rebound, no guarding, no peritoneal signs BACK:  The back appears normal and is non-tender to palpation, there is no CVA tenderness EXT: Tender to palpation diffusely over the left shoulder without loss of fullness or deformity. She is also tender over the left trapezius muscle which is tight and spasming. She is unable to lift her shoulder more than 20 off the bed secondary to pain. There is no erythema or warmth. 2+ radial pulses bilaterally. Normal grip strength bilaterally. Compartments are soft. Otherwise Normal ROM in all joints; otherwise extremities are non-tender to palpation; no edema; normal capillary refill; no cyanosis, no calf tenderness or swelling    SKIN: Normal color for age and race; warm; no rash NEURO: Moves all extremities equally, sensation to light touch intact diffusely, cranial nerves II through XII intact PSYCH: The patient's mood and manner are appropriate. Grooming and personal hygiene are appropriate.  MEDICAL DECISION MAKING: Patient here with what appears to be left shoulder pain from trapezius muscle spasm, possible tendinitis. No sign of septic arthritis or gout. Doubt this is ACS but given her comorbidities we will obtain one set of cardiac enzymes since pain is been present constantly since 4 PM yesterday. She does report some urinary frequency. No dysuria or hematuria. No urinary urgency. Will obtain urinalysis as well.  ED PROGRESS: Patient's labs unremarkable other than potassium of 2.9 which we will replace. Troponin is negative. Urine shows no sign of infection other than blood. We will send urine  culture. I do not feel this to be treated at this time. X-ray of the shoulder is unremarkable. Pain improved with Vicodin. We'll discharge with prescription for the same and outpatient orthopedic follow-up information. Discussed return precautions with patient and her family at bedside. They're comfortable with this plan. I will have her follow up with her PCP in one week to have her potassium rechecked. We are sending her home with prescription for potassium tablets. Again I suspect that her shoulder pain is secondary to muscle spasm, possible tendinitis. Doubt dissection, ACS, PE.   At this time, I do not feel there is any life-threatening condition present. I have reviewed and discussed all results (EKG, imaging, lab, urine as appropriate), exam findings with patient. I have reviewed nursing notes and appropriate previous records.  I feel the patient is safe to be discharged home without further emergent workup. Discussed usual and customary return precautions. Patient and family (if present) verbalize understanding and are comfortable with this plan.  Patient will follow-up with their primary care provider. If they  do not have a primary care provider, information for follow-up has been provided to them. All questions have been answered.     EKG Interpretation  Date/Time:  Sunday September 22 2015 05:10:00 EDT Ventricular Rate:  108 PR Interval:    QRS Duration: 92 QT Interval:  334 QTC Calculation: 447 R Axis:   47 Text Interpretation:  Atrial fibrillation with rapid ventricular response with premature ventricular or aberrantly conducted complexes ST \\T \ T wave abnormality, consider inferior ischemia ST \\T \ T wave abnormality, consider anterolateral ischemia Abnormal ECG No significant change since last tracing in 2014 Confirmed by Rajean Desantiago,  DO, Robt Okuda [phone]) on 09/22/2015 6:13:50 AM        [place], DO 09/22/15 [id number]

## 2015-09-22 NOTE — Discharge Instructions (Signed)
Shoulder Pain The shoulder is the joint that connects your arms to your body. The bones that form the shoulder joint include the upper arm bone (humerus), the shoulder blade (scapula), and the collarbone (clavicle). The top of the humerus is shaped like a ball and fits into a rather flat socket on the scapula (glenoid cavity). A combination of muscles and strong, fibrous tissues that connect muscles to bones (tendons) support your shoulder joint and hold the ball in the socket. Small, fluid-filled sacs (bursae) are located in different areas of the joint. They act as cushions between the bones and the overlying soft tissues and help reduce friction between the gliding tendons and the bone as you move your arm. Your shoulder joint allows a wide range of motion in your arm. This range of motion allows you to do things like scratch your back or throw a ball. However, this range of motion also makes your shoulder more prone to pain from overuse and injury. Causes of shoulder pain can originate from both injury and overuse and usually can be grouped in the following four categories:  Redness, swelling, and pain (inflammation) of the tendon (tendinitis) or the bursae (bursitis).  Instability, such as a dislocation of the joint.  Inflammation of the joint (arthritis).  Broken bone (fracture). HOME CARE INSTRUCTIONS   Apply ice to the sore area.  Put ice in a plastic bag.  Place a towel between your skin and the bag.  Leave the ice on for 15-20 minutes, 3-4 times per day for the first 2 days, or as directed by your health care provider.  Stop using cold packs if they do not help with the pain.  If you have a shoulder sling or immobilizer, wear it as long as your caregiver instructs. Only remove it to shower or bathe. Move your arm as little as possible, but keep your hand moving to prevent swelling.  Squeeze a soft ball or foam pad as much as possible to help prevent swelling.  Only take  over-the-counter or prescription medicines for pain, discomfort, or fever as directed by your caregiver. SEEK MEDICAL CARE IF:   Your shoulder pain increases, or new pain develops in your arm, hand, or fingers.  Your hand or fingers become cold and numb.  Your pain is not relieved with medicines. SEEK IMMEDIATE MEDICAL CARE IF:   Your arm, hand, or fingers are numb or tingling.  Your arm, hand, or fingers are significantly swollen or turn white or blue. MAKE SURE YOU:   Understand these instructions.  Will watch your condition.  Will get help right away if you are not doing well or get worse.   This information is not intended to replace advice given to you by your health care provider. Make sure you discuss any questions you have with your health care provider.   Document Released: 03/18/2005 Document Revised: 06/29/2014 Document Reviewed: 10/01/2014 Elsevier Interactive Patient Education 2016 [place] for Routine Care of Injuries Theroutine careofmanyinjuriesincludes rest, ice, compression, and elevation (RICE therapy). RICE therapy is often recommended for injuries to soft tissues, such as a muscle strain, ligament injuries, bruises, and overuse injuries. It can also be used for some bony injuries. Using RICE therapy can help to relieve pain, lessen swelling, and enable your body to heal. Rest Rest is required to allow your body to heal. This usually involves reducing your normal activities and avoiding use of the injured part of your body. Generally, you can return to  your normal activities when you are comfortable and have been given permission by your health care provider. Ice Icing your injury helps to keep the swelling down, and it lessens pain. Do not apply ice directly to your skin.  Put ice in a plastic bag.  Place a towel between your skin and the bag.  Leave the ice on for 20 minutes, 2-3 times a day. Do this for as long as you are directed by  your health care provider. Compression Compression means putting pressure on the injured area. Compression helps to keep swelling down, gives support, and helps with discomfort. Compression may be done with an elastic bandage. If an elastic bandage has been applied, follow these general tips:  Remove and reapply the bandage every 3-4 hours or as directed by your health care provider.  Make sure the bandage is not wrapped too tightly, because this can cut off circulation. If part of your body beyond the bandage becomes blue, numb, cold, swollen, or more painful, your bandage is most likely too tight. If this occurs, remove your bandage and reapply it more loosely.  See your health care provider if the bandage seems to be making your problems worse rather than better. Elevation Elevation means keeping the injured area raised. This helps to lessen swelling and decrease pain. If possible, your injured area should be elevated at or above the level of your heart or the center of your chest. [place]? You should seek medical care if:  Your pain and swelling continue.  Your symptoms are getting worse rather than improving. These symptoms may indicate that further evaluation or further X-rays are needed. Sometimes, X-rays may not show a small broken bone (fracture) until a number of days later. Make a follow-up appointment with your health care provider. WHEN SHOULD I SEEK IMMEDIATE MEDICAL CARE? You should seek immediate medical care if:  You have sudden severe pain at or below the area of your injury.  You have redness or increased swelling around your injury.  You have tingling or numbness at or below the area of your injury that does not improve after you remove the elastic bandage.   This information is not intended to replace advice given to you by your health care provider. Make sure you discuss any questions you have with your health care provider.   Document  Released: 09/20/2000 Document Revised: 02/27/2015 Document Reviewed: 05/16/2014 Elsevier Interactive Patient Education 2016 [employer].    Hypokalemia Hypokalemia means that the amount of potassium in the blood is lower than normal.Potassium is a chemical, called an electrolyte, that helps regulate the amount of fluid in the body. It also stimulates muscle contraction and helps nerves function properly.Most of the body's potassium is inside of cells, and only a very small amount is in the blood. Because the amount in the blood is so small, minor changes can be life-threatening. CAUSES  Antibiotics.  Diarrhea or vomiting.  Using laxatives too much, which can cause diarrhea.  Chronic kidney disease.  Water pills (diuretics).  Eating disorders (bulimia).  Low magnesium level.  Sweating a lot. SIGNS AND SYMPTOMS  Weakness.  Constipation.  Fatigue.  Muscle cramps.  Mental confusion.  Skipped heartbeats or irregular heartbeat (palpitations).  Tingling or numbness. DIAGNOSIS  Your health care provider can diagnose hypokalemia with blood tests. In addition to checking your potassium level, your health care provider may also check other lab tests. TREATMENT Hypokalemia can be treated with potassium supplements taken by  mouth or adjustments in your current medicines. If your potassium level is very low, you may need to get potassium through a vein (IV) and be monitored in the hospital. A diet high in potassium is also helpful. Foods high in potassium are:  Nuts, such as peanuts and pistachios.  Seeds, such as sunflower seeds and pumpkin seeds.  Peas, lentils, and lima beans.  Whole grain and bran cereals and breads.  Fresh fruit and vegetables, such as apricots, avocado, bananas, cantaloupe, kiwi, oranges, tomatoes, asparagus, and potatoes.  Orange and tomato juices.  Red meats.  Fruit yogurt. HOME CARE INSTRUCTIONS  Take all medicines as prescribed by your  health care provider.  Maintain a healthy diet by including nutritious food, such as fruits, vegetables, nuts, whole grains, and lean meats.  If you are taking a laxative, be sure to follow the directions on the label. SEEK MEDICAL CARE IF:  Your weakness gets worse.  You feel your heart pounding or racing.  You are vomiting or having diarrhea.  You are diabetic and having trouble keeping your blood glucose in the normal range. SEEK IMMEDIATE MEDICAL CARE IF:  You have chest pain, shortness of breath, or dizziness.  You are vomiting or having diarrhea for more than 2 days.  You faint. MAKE SURE YOU:   Understand these instructions.  Will watch your condition.  Will get help right away if you are not doing well or get worse.   This information is not intended to replace advice given to you by your health care provider. Make sure you discuss any questions you have with your health care provider.   Document Released: 06/08/2005 Document Revised: 06/29/2014 Document Reviewed: 12/09/2012 Elsevier Interactive Patient Education [employer].

## 2015-09-22 NOTE — ED Notes (Signed)
PT states she was just sitting and then around 4pm got tired and then started getting pain in left shoulder like she could not move it.  Pt states it hurts to try to hold it up.  Pulse present.

## 2015-09-23 LAB — URINE CULTURE

## 2015-10-07 ENCOUNTER — Other Ambulatory Visit (INDEPENDENT_AMBULATORY_CARE_PROVIDER_SITE_OTHER): Payer: Commercial Managed Care - HMO

## 2015-10-07 ENCOUNTER — Other Ambulatory Visit: Payer: Self-pay | Admitting: Family Medicine

## 2015-10-07 DIAGNOSIS — E039 Hypothyroidism, unspecified: Secondary | ICD-10-CM | POA: Diagnosis not present

## 2015-10-07 LAB — TSH: TSH: 1.02 u[IU]/mL (ref 0.35–4.50)

## 2015-10-08 ENCOUNTER — Encounter: Payer: Self-pay | Admitting: *Deleted

## 2015-10-21 ENCOUNTER — Encounter: Payer: Self-pay | Admitting: Internal Medicine

## 2015-10-21 ENCOUNTER — Ambulatory Visit (INDEPENDENT_AMBULATORY_CARE_PROVIDER_SITE_OTHER): Payer: Commercial Managed Care - HMO | Admitting: Internal Medicine

## 2015-10-21 VITALS — BP 122/86 | HR 64 | Ht 63.0 in | Wt 177.8 lb

## 2015-10-21 DIAGNOSIS — Z7901 Long term (current) use of anticoagulants: Secondary | ICD-10-CM | POA: Diagnosis not present

## 2015-10-21 DIAGNOSIS — Z8679 Personal history of other diseases of the circulatory system: Secondary | ICD-10-CM

## 2015-10-21 DIAGNOSIS — Z1211 Encounter for screening for malignant neoplasm of colon: Secondary | ICD-10-CM | POA: Diagnosis not present

## 2015-10-21 MED ORDER — NA SULFATE-K SULFATE-MG SULF 17.5-3.13-1.6 GM/177ML PO SOLN: 1.0000 | Freq: Once | ORAL | Status: DC

## 2015-10-21 NOTE — Progress Notes (Signed)
HISTORY OF PRESENT ILLNESS:  Joanna Hood is a 70 y.o. female with multiple medical problems including history of atrial fibrillation for which she is on Eliquis therapy. The patient presents today regarding surveillance colonoscopy and the need to address her anticoagulation. She underwent index colonoscopy August 2006 and surveillance colonoscopy August 2011 with subcentimeter tubular adenomas on each exam. Follow-up in 5 years from her previous exam recommended. Patient reports developing atrial fibrillation (symptomatic) 2015 for which she was treated with medical therapy and placed on Eliquis. No neurologic events or other complications of her atrial fibrillation. Doing well recently. Followed by Dr. Gwenlyn Found. GI review of systems currently negative. She remains active  REVIEW OF SYSTEMS:  All non-GI ROS negative except for irregular heart rate, heart murmur  Past Medical History  Diagnosis Date  . Malignant hypertension longstanding  . Hypothyroidism   . SOB (shortness of breath) 04/29/2013  . HLD (hyperlipidemia)   . Chronic diastolic CHF (congestive heart failure) (HCC)     HTN  . Atrial fibrillation with RVR ([place]) 2014    persistent  . History of chicken pox   . History of colon polyps     benign  . Heart murmur   . History of anemia     unclear cause, now resolved  . Osteopenia 12/2013    T -2.3 hip  . Right lower lobe pneumonia 07/24/2014  . Vitamin D deficiency   . Microcytosis   . Goiter     Past Surgical History  Procedure Laterality Date  . Partial hysterectomy  1970    fibroids  . Cardiac catheterization  2014    normal per patient  . Left heart catheterization with coronary angiogram N/A 05/01/2013    Procedure: LEFT HEART CATHETERIZATION WITH CORONARY ANGIOGRAM;  Surgeon: Leonie Man, MD;  Location: [facility] CATH LAB;  Service: Cardiovascular;  Laterality: N/A;    Social History Joanna Hood  reports that she quit smoking about 46 years ago. She has  quit using smokeless tobacco. She reports that she does not drink alcohol or use illicit drugs.  family history includes Cancer in her maternal grandmother and mother; Diabetes in her sister; Hypertension in her cousin and sister; Sudden death in her mother. There is no history of CAD or Stroke.  Allergies  Allergen Reactions  . Metronidazole     REACTION: Rash, itching       PHYSICAL EXAMINATION: Vital signs: BP 122/86 mmHg  Pulse 64  Ht 5\' 3"  (1.6 m)  Wt 177 lb 12.8 oz (80.65 kg)  BMI 31.50 kg/m2  Constitutional: generally well-appearing, no acute distress Psychiatric: alert and oriented x3, cooperative Eyes: extraocular movements intact, anicteric, conjunctiva pink Mouth: oral pharynx moist, no lesions Neck: suppleWithout thyromegaly Lymph: no lymphadenopathy Cardiovascular: Regular with intermittent irregular beats  with 2/6 systolic murmur Lungs: clear to auscultation bilaterally Abdomen: soft, nontender, nondistended, no obvious ascites, no peritoneal signs, normal bowel sounds, no organomegaly Rectal: Deferred until colonoscopy Extremities: no clubbing cyanosis or lower extremity edema bilaterally Skin: no lesions on visible extremities Neuro: No focal deficits. Normal DTRs. Cranial nerves intact  ASSESSMENT:  #1. History of adenomatous colon polyps 2000 11/29/2009. Due for surveillance #2. History of atrial fibrillation for which she is on Eliquis   PLAN:  #1. Surveillance colonoscopy. Appropriate candidate without contraindication. High-risk due to her need for chronic anticoagulation therapy.The nature of the procedure, as well as the risks, benefits, and alternatives were carefully and thoroughly reviewed with the patient. Ample time for  discussion and questions allowed. The patient understood, was satisfied, and agreed to proceed. #2. Hold Eliquis 3 days prior to procedure. Anticipate resumption of the day of the procedure assuming no overwhelming pathology  requiring therapy. We will confer with her cardiologist, Dr. Gwenlyn Found to see if this is acceptable.

## 2015-10-21 NOTE — Patient Instructions (Signed)

## 2015-10-23 ENCOUNTER — Encounter: Payer: Self-pay | Admitting: Family Medicine

## 2015-10-23 ENCOUNTER — Ambulatory Visit (INDEPENDENT_AMBULATORY_CARE_PROVIDER_SITE_OTHER): Payer: Commercial Managed Care - HMO | Admitting: Family Medicine

## 2015-10-23 VITALS — BP 126/82 | HR 56 | Temp 97.8°F | Wt 177.0 lb

## 2015-10-23 DIAGNOSIS — M25511 Pain in right shoulder: Secondary | ICD-10-CM | POA: Insufficient documentation

## 2015-10-23 DIAGNOSIS — E039 Hypothyroidism, unspecified: Secondary | ICD-10-CM

## 2015-10-23 DIAGNOSIS — I10 Essential (primary) hypertension: Secondary | ICD-10-CM

## 2015-10-23 DIAGNOSIS — I481 Persistent atrial fibrillation: Secondary | ICD-10-CM

## 2015-10-23 DIAGNOSIS — I4819 Other persistent atrial fibrillation: Secondary | ICD-10-CM

## 2015-10-23 LAB — BASIC METABOLIC PANEL
BUN: 13 mg/dL (ref 6–23)
CO2: 27 mEq/L (ref 19–32)
CREATININE: 0.76 mg/dL (ref 0.40–1.20)
Calcium: 9.8 mg/dL (ref 8.4–10.5)
Chloride: 101 mEq/L (ref 96–112)
GFR: 97.21 mL/min (ref >60.00)
Glucose, Bld: 94 mg/dL (ref 70–99)
POTASSIUM: 3.8 meq/L (ref 3.5–5.1)
Sodium: 138 mEq/L (ref 135–145)

## 2015-10-23 NOTE — Assessment & Plan Note (Signed)
Recent stable readings - continue current levothyroxine dose.

## 2015-10-23 NOTE — Assessment & Plan Note (Signed)
Anticipate RTC injury/tendonitis. Treat with tylenol, heating pad, exercises from SM pt advisor. Update if no improvement with treatment.

## 2015-10-23 NOTE — Assessment & Plan Note (Signed)
Appreciate cards care. Continue eliquis bid. Latest echo showing mod MR/TR and severe LAE.

## 2015-10-23 NOTE — Patient Instructions (Addendum)
Labs today.  I think you have rotator cuff injury of right shoulder - tylenol, heating pad, exercises provided today. Let us know if not improving with this You are doing well today. Return as needed or in 6 months for medicare wellness visit

## 2015-10-23 NOTE — Assessment & Plan Note (Signed)
Chronic, stable. Continue current regimen. 

## 2015-10-23 NOTE — Progress Notes (Signed)
BP 126/82 mmHg  Pulse 56  Temp(Src) 97.8 F (36.6 C) (Oral)  Wt 177 lb (80.287 kg)   CC: 77mof/u visit, med refill visit, ER f/u  Subjective:    Patient ID: PCrista Hood female    DOB: [birth year] 70y.o.   MRN: [id number] HPI: Joanna Merschis a 70y.o. female presenting on 10/23/2015 for Medication Refill and Extremity Weakness   Persistent afib - stable on eliquis, followed by cardiology. known chronic diastolic CHF. Updated echo showed moderate MR and moderate TR and elevated RVSP 436mg along with severely dilated LA.  Microcytosis - ?thalassemia Hypothyroidism - stable on latest check. Saw GI - upcoming colonoscopy.   Seen at ER last month with L shoulder pain x with trap spasm and possible tendonitis. Found to have hypokalemia - replaced. Due for rechecking.   Today noticing some arm swelling and some R shoulder stiffness. Denies inciting trauma/fall/injury. Denies extremity weakness but they occasionally feel "tired" usually improved with ice and/or heat.   Relevant past medical, surgical, family and social history reviewed and updated as indicated. Interim medical history since our last visit reviewed. Allergies and medications reviewed and updated. Current Outpatient Prescriptions on File Prior to Visit  Medication Sig  . amLODipine (NORVASC) 10 MG tablet TAKE 1 TABLET DAILY **MUST HAVE PHYSICAL FOR FURTHER REFILLS**  . apixaban (ELIQUIS) 5 MG TABS tablet Take 1 tablet (5 mg total) by mouth 2 (two) times daily.  . Calcium Carb-Cholecalciferol (CALCIUM-VITAMIN D) 600-400 MG-UNIT TABS Take 1 tablet by mouth daily.  . cholecalciferol (VITAMIN D) 1000 UNITS tablet Take 1,000 Units by mouth daily.  . furosemide (LASIX) 20 MG tablet Take 1 tablet (20 mg total) by mouth daily.  . Marland Kitchenevothyroxine (SYNTHROID, LEVOTHROID) 150 MCG tablet TAKE 1 TABLET BY MOUTH EVERY MORNING BEFORE BREAKFAST  . metoprolol tartrate (LOPRESSOR) 25 MG tablet Take 1 tablet (25 mg total) by  mouth 2 (two) times daily.  . pantoprazole (PROTONIX) 40 MG tablet Take 1 tablet (40 mg total) by mouth daily.  . potassium chloride SA (K-DUR,KLOR-CON) 20 MEQ tablet TAKE 1 TABLET EVERY DAY (Patient taking differently: TAKE 1 TABLET Twice daily)  . ramipril (ALTACE) 10 MG capsule Take 1 capsule (10 mg total) by mouth 2 (two) times daily.  . Na Sulfate-K Sulfate-Mg Sulf SOLN Take 1 kit by mouth once. (Patient not taking: Reported on 10/23/2015)   No current facility-administered medications on file prior to visit.    Review of Systems Per HPI unless specifically indicated in ROS section     Objective:    BP 126/82 mmHg  Pulse 56  Temp(Src) 97.8 F (36.6 C) (Oral)  Wt 177 lb (80.287 kg)  Wt Readings from Last 3 Encounters:  10/23/15 177 lb (80.287 kg)  10/21/15 177 lb 12.8 oz (80.65 kg)  05/03/15 184 lb (83.462 kg)    Physical Exam  Constitutional: She is oriented to person, place, and time. She appears well-developed and well-nourished. No distress.  HENT:  Mouth/Throat: Oropharynx is clear and moist. No oropharyngeal exudate.  Cardiovascular: Intact distal pulses.  An irregularly irregular rhythm present. Bradycardia present.   Murmur (3/6 SEM) heard. Pulmonary/Chest: Effort normal and breath sounds normal. No respiratory distress. She has no wheezes. She has no rales.  Musculoskeletal: She exhibits no edema.  L shoulder WNL R shoulder with limited active ROM, full passive ROM Tender to palpation anterior shoulder just [facility] No trap muscle tenderness No impingement  Neurological: She is  alert and oriented to person, place, and time.  Skin: Skin is warm and dry. No rash noted.  Psychiatric: She has a normal mood and affect.  Nursing note and vitals reviewed.  Results for orders placed or performed in visit on 10/07/15  TSH  Result Value Ref Range   TSH 1.02 0.35 - 4.50 uIU/mL      Assessment & Plan:   Problem List Items Addressed This Visit     Hypothyroidism    Recent stable readings - continue current levothyroxine dose.      Persistent atrial fibrillation (HCC)    Appreciate cards care. Continue eliquis bid. Latest echo showing mod MR/TR and severe LAE.       Relevant Orders   Ambulatory referral to Cardiology   HTN (hypertension), malignant - Primary    Chronic, stable. Continue current regimen.      Relevant Orders   Basic metabolic panel   Right shoulder pain    Anticipate RTC injury/tendonitis. Treat with tylenol, heating pad, exercises from [facility] pt advisor. Update if no improvement with treatment.          Follow up plan: Return in about 6 months (around 04/24/2016), or as needed, for medicare wellness visit.   , MD   

## 2015-10-23 NOTE — Progress Notes (Signed)
Pre visit review using our clinic review tool, if applicable. No additional management support is needed unless otherwise documented below in the visit note. 

## 2015-10-25 ENCOUNTER — Encounter: Payer: Self-pay | Admitting: *Deleted

## 2015-11-01 ENCOUNTER — Telehealth: Payer: Self-pay

## 2015-11-01 NOTE — Telephone Encounter (Signed)
Patient notified and appt scheduled.

## 2015-11-01 NOTE — Telephone Encounter (Signed)
Pt left v/m; pt seen 10/23/15 with swelling in arms and knee;arms are no better and knee has worsened to the point difficult to walk; pt request cb with what to do.

## 2015-11-01 NOTE — Telephone Encounter (Signed)
Would offer her recheck in office to evaluate legs - wasn't having leg pain or swelling at last visit  May need to refer to ortho.

## 2015-11-05 ENCOUNTER — Other Ambulatory Visit: Payer: Self-pay | Admitting: Family Medicine

## 2015-11-05 ENCOUNTER — Ambulatory Visit (INDEPENDENT_AMBULATORY_CARE_PROVIDER_SITE_OTHER): Payer: Commercial Managed Care - HMO | Admitting: Family Medicine

## 2015-11-05 ENCOUNTER — Ambulatory Visit (INDEPENDENT_AMBULATORY_CARE_PROVIDER_SITE_OTHER)
Admission: RE | Admit: 2015-11-05 | Discharge: 2015-11-05 | Disposition: A | Discharge status: Home / Self Care | Payer: Commercial Managed Care - HMO | Source: Ambulatory Visit | Attending: Family Medicine | Admitting: Family Medicine

## 2015-11-05 ENCOUNTER — Encounter: Payer: Self-pay | Admitting: Family Medicine

## 2015-11-05 VITALS — BP 140/90 | HR 76 | Temp 97.8°F | Wt 175.5 lb

## 2015-11-05 DIAGNOSIS — M25462 Effusion, left knee: Secondary | ICD-10-CM | POA: Diagnosis not present

## 2015-11-05 DIAGNOSIS — M25469 Effusion, unspecified knee: Secondary | ICD-10-CM | POA: Insufficient documentation

## 2015-11-05 LAB — CBC WITH DIFFERENTIAL/PLATELET
BASOS ABS: 0 10*3/uL (ref 0.0–0.1)
Basophils Relative: 0.1 % (ref 0.0–3.0)
Eosinophils Absolute: 0 10*3/uL (ref 0.0–0.7)
Eosinophils Relative: 0.6 % (ref 0.0–5.0)
HCT: 34.8 % — ABNORMAL LOW (ref 36.0–46.0)
Hemoglobin: 11 g/dL — ABNORMAL LOW (ref 12.0–15.0)
LYMPHS ABS: 1 10*3/uL (ref 0.7–4.0)
LYMPHS PCT: 15.8 % (ref 12.0–46.0)
MCHC: 31.7 g/dL (ref 30.0–36.0)
MCV: 74.7 fl — ABNORMAL LOW (ref 78.0–100.0)
MONO ABS: 0.5 10*3/uL (ref 0.1–1.0)
Monocytes Relative: 7.5 % (ref 3.0–12.0)
NEUTROS PCT: 76 % (ref 43.0–77.0)
Neutro Abs: 4.7 10*3/uL (ref 1.4–7.7)
Platelets: 243 10*3/uL (ref 150.0–400.0)
RBC: 4.66 Mil/uL (ref 3.87–5.11)
RDW: 15 % (ref 11.5–15.5)
WBC: 6.2 10*3/uL (ref 4.0–10.5)

## 2015-11-05 LAB — URIC ACID: URIC ACID, SERUM: 5 mg/dL (ref 2.4–7.0)

## 2015-11-05 LAB — SEDIMENTATION RATE: SED RATE: 49 mm/h (ref 0–22)

## 2015-11-05 LAB — RHEUMATOID FACTOR: Rhuematoid fact SerPl-aCnc: 27 IU/mL — ABNORMAL HIGH (ref <=14)

## 2015-11-05 MED ORDER — DICLOFENAC SODIUM 1 % TD GEL: 1.0000 "application " | Freq: Three times a day (TID) | TRANSDERMAL | Status: DC

## 2015-11-05 NOTE — Progress Notes (Signed)
Pre visit review using our clinic review tool, if applicable. No additional management support is needed unless otherwise documented below in the visit note. 

## 2015-11-05 NOTE — Progress Notes (Signed)
BP 140/90 mmHg  Pulse 76  Temp(Src) 97.8 F (36.6 C) (Oral)  Wt 175 lb 8 oz (79.606 kg)   CC: check L knee swelling  Subjective:    Patient ID: Crista Curb, female    DOB: 1946/06/09, 70 y.o.   MRN: [id number]  HPI: Krystyna Cleckley is a 70 y.o. female presenting on 11/05/2015 for Knee Pain   See prior note for details. Prior seen at ER with L shoulder pain thought trap spasm and tendonitis. Shoulder is feeling better. Arms feel stiff - worse in am. This morning with R hand pain at 3rd/4th MCP.   L knee swelling started after last visit. Also uncomfortable. Occasional warmth. No fever, no redness. Denies inciting trauma/injury or fall. Has tried only tylenol for this. Causes limp. Worse this past weekend. Worse pain/stiffness in am, then slowly improves.   H/o intermittent knee swelling/pain when she worked prolonged standing on cement floor.   Denies h/o arthritis in the past, denies h/o gout.   Relevant past medical, surgical, family and social history reviewed and updated as indicated. Interim medical history since our last visit reviewed. Allergies and medications reviewed and updated. Current Outpatient Prescriptions on File Prior to Visit  Medication Sig  . amLODipine (NORVASC) 10 MG tablet TAKE 1 TABLET DAILY **MUST HAVE PHYSICAL FOR FURTHER REFILLS**  . apixaban (ELIQUIS) 5 MG TABS tablet Take 1 tablet (5 mg total) by mouth 2 (two) times daily.  . Calcium Carb-Cholecalciferol (CALCIUM-VITAMIN D) 600-400 MG-UNIT TABS Take 1 tablet by mouth daily.  . cholecalciferol (VITAMIN D) 1000 UNITS tablet Take 1,000 Units by mouth daily.  . furosemide (LASIX) 20 MG tablet Take 1 tablet (20 mg total) by mouth daily.  Marland Kitchen levothyroxine (SYNTHROID, LEVOTHROID) 150 MCG tablet TAKE 1 TABLET BY MOUTH EVERY MORNING BEFORE BREAKFAST  . metoprolol tartrate (LOPRESSOR) 25 MG tablet Take 1 tablet (25 mg total) by mouth 2 (two) times daily.  . pantoprazole (PROTONIX) 40 MG tablet Take 1  tablet (40 mg total) by mouth daily.  . potassium chloride SA (K-DUR,KLOR-CON) 20 MEQ tablet TAKE 1 TABLET EVERY DAY  . ramipril (ALTACE) 10 MG capsule Take 1 capsule (10 mg total) by mouth 2 (two) times daily.  . Na Sulfate-K Sulfate-Mg Sulf SOLN Take 1 kit by mouth once. (Patient not taking: Reported on 10/23/2015)   No current facility-administered medications on file prior to visit.    Review of Systems Per HPI unless specifically indicated in ROS section     Objective:    BP 140/90 mmHg  Pulse 76  Temp(Src) 97.8 F (36.6 C) (Oral)  Wt 175 lb 8 oz (79.606 kg)  Wt Readings from Last 3 Encounters:  11/05/15 175 lb 8 oz (79.606 kg)  10/23/15 177 lb (80.287 kg)  10/21/15 177 lb 12.8 oz (80.65 kg)    Physical Exam  Constitutional: She appears well-developed and well-nourished. No distress.  Musculoskeletal: She exhibits edema (L knee).  R knee WNL L Knee exam: No deformity on inspection. Tender with palpation of joint lines + effusion/swelling noted. FROM in flex/extension with some crepitus/popping. No popliteal fullness. Neg drawer test. + mcmurray test. No pain with valgus/varus stress. + PFgrind. No abnormal patellar mobility.   Skin: Skin is warm and dry. No rash noted.  Psychiatric: She has a normal mood and affect.  Nursing note and vitals reviewed.  Results for orders placed or performed in visit on 46/56/81  Basic metabolic panel  Result Value Ref Range   Sodium  138 135 - 145 mEq/L   Potassium 3.8 3.5 - 5.1 mEq/L   Chloride 101 96 - 112 mEq/L   CO2 27 19 - 32 mEq/L   Glucose, Bld 94 70 - 99 mg/dL   BUN 13 6 - 23 mg/dL   Creatinine, Ser 0.76 0.40 - 1.20 mg/dL   Calcium 9.8 8.4 - 10.5 mg/dL   GFR 97.21 >60.00 mL/min      Assessment & Plan:   Problem List Items Addressed This Visit    Knee swelling - Primary    Anticipate OA flare. Supportive care discussed - continue tylenol, add voltaren gel. Avoid oral NSAIDs due to eliquis use. Ice, elevate,  compression with knee brace. Check xray to eval arthritic burden. Eval for other causes of arthritis (CBC, ESR, urate, and RF). Update if red flags or not improving with treatment. No signs of septic arthritis - FROM without stiffness, no erythema      Relevant Orders   Sedimentation rate   Rheumatoid factor   CBC with Differential/Platelet   Uric acid       Follow up plan: Return if symptoms worsen or fail to improve.  Ria Bush, MD

## 2015-11-05 NOTE — Patient Instructions (Signed)
I think you have osteoarthritis flare - or wear and tear arthritis. Xray of knee today, labwork to check for other causes of arthritis. Treat with continued tylenol, voltaren gel sent to pharmacy, ice knee, use brace, elevate leg when seated. If fever, worsening pain/swelling/warmth, or not improving with treatment, let us know.

## 2015-11-05 NOTE — Assessment & Plan Note (Signed)
Anticipate OA flare. Supportive care discussed - continue tylenol, add voltaren gel. Avoid oral NSAIDs due to eliquis use. Ice, elevate, compression with knee brace. Check xray to eval arthritic burden. Eval for other causes of arthritis (CBC, ESR, urate, and RF). Update if red flags or not improving with treatment. No signs of septic arthritis - FROM without stiffness, no erythema

## 2015-11-05 NOTE — Addendum Note (Signed)
Addended by: Marchia Bond on: 11/05/2015 03:57 PM   Modules accepted: Miquel Dunn

## 2015-11-08 ENCOUNTER — Encounter: Payer: Self-pay | Admitting: Internal Medicine

## 2015-11-08 ENCOUNTER — Ambulatory Visit (INDEPENDENT_AMBULATORY_CARE_PROVIDER_SITE_OTHER): Payer: Commercial Managed Care - HMO | Admitting: Cardiovascular Disease

## 2015-11-08 ENCOUNTER — Encounter: Payer: Self-pay | Admitting: Cardiovascular Disease

## 2015-11-08 VITALS — BP 150/84 | HR 65 | Ht 65.0 in | Wt 175.8 lb

## 2015-11-08 DIAGNOSIS — I481 Persistent atrial fibrillation: Secondary | ICD-10-CM

## 2015-11-08 DIAGNOSIS — I10 Essential (primary) hypertension: Secondary | ICD-10-CM

## 2015-11-08 DIAGNOSIS — I4819 Other persistent atrial fibrillation: Secondary | ICD-10-CM

## 2015-11-08 NOTE — Patient Instructions (Signed)
You have been cleared for your procedure! I will fax a clearance letter to Dr Blanch Media office. You will have to hold your Eliquis for 3 days prior to the procedure.  Dr Gwenlyn Found recommends that you schedule a follow-up appointment in 1 year. You will receive a reminder letter in the mail two months in advance. If you don't receive a letter, please call our office to schedule the follow-up appointment.  If you need a refill on your cardiac medications before your next appointment, please call your pharmacy.

## 2015-11-08 NOTE — Progress Notes (Signed)
11/08/2015 Joanna Hood   05-Oct-1945  [id number]  Primary Physician Ria Bush, MD Primary Cardiologist: Lorretta Harp MD Renae Gloss   HPI:  Joanna Hood is a delightful 70 year old moderately overweight African-American female who I initially met several years ago when she was admitted to [facility] with diastolic heart failure, hypertension and atrial fibrillation. She ultimately underwent cardiac catheterization revealing essentially normal coronary arteries and normal LV function by Dr. Ellyn Hack. Her A. Fib converted to sinus rhythm with the aid of IV Cardizem. Her blood pressure was better controlled. She saw Ellen Henri PA-C in the office  and again was in atrial fibrillation at which time her beta blocker was adjusted and Eliquis was added because of increased CHA2DSVASC2 score of 2. Since I saw her in the office 05/01/14 she remained clinically stable. She denies chest pain or shortness of breath. She apparently needs a colonoscopy which she can have a low risk and stop her anticoagulation several days before.   Current Outpatient Prescriptions  Medication Sig Dispense Refill  . amLODipine (NORVASC) 10 MG tablet TAKE 1 TABLET DAILY **MUST HAVE PHYSICAL FOR FURTHER REFILLS** 90 tablet 2  . apixaban (ELIQUIS) 5 MG TABS tablet Take 1 tablet (5 mg total) by mouth 2 (two) times daily. 180 tablet 3  . Calcium Carb-Cholecalciferol (CALCIUM-VITAMIN D) 600-400 MG-UNIT TABS Take 1 tablet by mouth daily.    . cholecalciferol (VITAMIN D) 1000 UNITS tablet Take 1,000 Units by mouth daily.    . diclofenac sodium (VOLTAREN) 1 % GEL Apply 1 application topically 3 (three) times daily. 1 Tube 1  . furosemide (LASIX) 20 MG tablet Take 1 tablet (20 mg total) by mouth daily. 90 tablet 3  . levothyroxine (SYNTHROID, LEVOTHROID) 150 MCG tablet TAKE 1 TABLET BY MOUTH EVERY MORNING BEFORE BREAKFAST 90 tablet 3  . metoprolol tartrate (LOPRESSOR) 25 MG tablet Take 1  tablet (25 mg total) by mouth 2 (two) times daily. 180 tablet 3  . Na Sulfate-K Sulfate-Mg Sulf SOLN Take 1 kit by mouth once. 354 mL 0  . pantoprazole (PROTONIX) 40 MG tablet Take 1 tablet (40 mg total) by mouth daily. 90 tablet 3  . potassium chloride SA (K-DUR,KLOR-CON) 20 MEQ tablet TAKE 1 TABLET EVERY DAY 90 tablet 3  . ramipril (ALTACE) 10 MG capsule Take 1 capsule (10 mg total) by mouth 2 (two) times daily. 180 capsule 3   No current facility-administered medications for this visit.    Allergies  Allergen Reactions  . Metronidazole     REACTION: Rash, itching    Social History   Social History  . Marital Status: Single    Spouse Name: N/A  . Number of Children: 1  . Years of Education: N/A   Occupational History  . Retired    Social History Main Topics  . Smoking status: Former Smoker    Quit date: 05/16/1969  . Smokeless tobacco: Former Systems developer  . Alcohol Use: No  . Drug Use: No  . Sexual Activity: Not on file   Other Topics Concern  . 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  Cardiovascular: negative for chest pain, dyspnea on exertion, edema, orthopnea, palpitations, paroxysmal nocturnal dyspnea or shortness of breath Dermatological: negative for rash  Respiratory: negative for cough or wheezing Urologic: negative for hematuria Abdominal: negative for nausea, vomiting, diarrhea, bright red blood per rectum, melena, or hematemesis Neurologic: negative for visual changes, syncope, or dizziness All other systems reviewed and are otherwise negative except as noted above.    Blood pressure 150/84, pulse 65, height 5' 5" (1.651 m), weight 175 lb 12.8 oz (79.742 kg), SpO2 99 %.  General appearance: alert and no distress Neck: no  adenopathy, no carotid bruit, no JVD, supple, symmetrical, trachea midline and thyroid not enlarged, symmetric, no tenderness/mass/nodules Lungs: clear to auscultation bilaterally Heart: irregularly irregular rhythm Extremities: extremities normal, atraumatic, no cyanosis or edema  EKG not performed today  ASSESSMENT AND PLAN:   Persistent atrial fibrillation History of persistent atrial fibrillation on Eliquis oral anticoagulation rate controlled.  HTN (hypertension), malignant history of hypertension blood pressure measured today at 150/84. She is on amlodipine, metoprolol and ramipril. Continue current meds at current dosing      Lorretta Harp MD [facility] 11/08/2015 10:19 AM

## 2015-11-08 NOTE — Assessment & Plan Note (Signed)
History of persistent atrial fibrillation on Eliquis oral anticoagulation rate controlled.

## 2015-11-08 NOTE — Assessment & Plan Note (Signed)
history of hypertension blood pressure measured today at 150/84. She is on amlodipine, metoprolol and ramipril. Continue current meds at current dosing

## 2015-11-13 ENCOUNTER — Telehealth: Payer: Self-pay

## 2015-11-13 NOTE — Telephone Encounter (Signed)
Spoke with patient and told her that, Per Dr. Gwenlyn Found, she could proceed with her colonoscopy without further cardiac testing and she could hold her Eliquis for 3 days prior to her colonoscopy.  Patient acknowledged and understood

## 2015-11-19 ENCOUNTER — Telehealth: Payer: Self-pay | Admitting: Family Medicine

## 2015-11-19 DIAGNOSIS — M1712 Unilateral primary osteoarthritis, left knee: Secondary | ICD-10-CM

## 2015-11-19 DIAGNOSIS — M25462 Effusion, left knee: Secondary | ICD-10-CM

## 2015-11-19 DIAGNOSIS — R768 Other specified abnormal immunological findings in serum: Secondary | ICD-10-CM

## 2015-11-19 DIAGNOSIS — M25511 Pain in right shoulder: Secondary | ICD-10-CM

## 2015-11-19 NOTE — Telephone Encounter (Signed)
Referral placed I never received prior message (result note)

## 2015-11-19 NOTE — Telephone Encounter (Signed)
Pt called about Rheumatology referral. I gave her info to [facility] and she is aware they will call her to schedule. Pt wants you to know that her knees are still swelling and it is difficult to get up and down. Please enter referral.

## 2015-11-21 DIAGNOSIS — M069 Rheumatoid arthritis, unspecified: Secondary | ICD-10-CM

## 2015-11-21 HISTORY — DX: Rheumatoid arthritis, unspecified: M06.9

## 2015-11-21 HISTORY — PX: COLONOSCOPY: SHX174

## 2015-11-22 ENCOUNTER — Encounter: Payer: Self-pay | Admitting: Internal Medicine

## 2015-11-22 ENCOUNTER — Ambulatory Visit (AMBULATORY_SURGERY_CENTER): Payer: Commercial Managed Care - HMO | Admitting: Internal Medicine

## 2015-11-22 VITALS — BP 134/65 | HR 70 | Temp 99.1°F | Resp 20 | Ht 63.0 in | Wt 177.0 lb

## 2015-11-22 DIAGNOSIS — I4891 Unspecified atrial fibrillation: Secondary | ICD-10-CM | POA: Diagnosis not present

## 2015-11-22 DIAGNOSIS — K222 Esophageal obstruction: Secondary | ICD-10-CM | POA: Diagnosis not present

## 2015-11-22 DIAGNOSIS — D125 Benign neoplasm of sigmoid colon: Secondary | ICD-10-CM

## 2015-11-22 DIAGNOSIS — Z1211 Encounter for screening for malignant neoplasm of colon: Secondary | ICD-10-CM

## 2015-11-22 DIAGNOSIS — D122 Benign neoplasm of ascending colon: Secondary | ICD-10-CM

## 2015-11-22 DIAGNOSIS — Z86010 Personal history of colonic polyps: Secondary | ICD-10-CM | POA: Diagnosis not present

## 2015-11-22 DIAGNOSIS — I10 Essential (primary) hypertension: Secondary | ICD-10-CM | POA: Diagnosis not present

## 2015-11-22 MED ORDER — SODIUM CHLORIDE 0.9 % IV SOLN: 500.0000 mL | INTRAVENOUS | Status: DC

## 2015-11-22 NOTE — Progress Notes (Signed)
Pt. Has limited ROM in  Elbows bilateral noted during admission process.

## 2015-11-22 NOTE — Op Note (Signed)
[facility] Patient Name: Joanna Hood Procedure Date: 11/22/2015 1:36 PM MRN: [id number] Endoscopist: Docia Chuck. Henrene Pastor , MD Age: 70 Referring MD:  Date of Birth: Apr 14, 1946 Gender: Female Procedure:                Colonoscopy, with snare polypectomy x 2 Indications:              Surveillance: Personal history of adenomatous                            polyps on last colonoscopy 5 years ago. Previous                            examinations 2006 and 2011 with small tubular                            adenomas Medicines:                Monitored Anesthesia Care Procedure:                Pre-Anesthesia Assessment:                           - Prior to the procedure, a History and Physical                            was performed, and patient medications and                            allergies were reviewed. The patient's tolerance of                            previous anesthesia was also reviewed. The risks                            and benefits of the procedure and the sedation                            options and risks were discussed with the patient.                            All questions were answered, and informed consent                            was obtained. Prior Anticoagulants: The patient has                            taken Eliquis (apixaban), last dose was 3 days                            prior to procedure. ASA Grade Assessment: III - A                            patient with severe systemic disease. After  reviewing the risks and benefits, the patient was                            deemed in satisfactory condition to undergo the                            procedure.                           After obtaining informed consent, the colonoscope                            was passed under direct vision. Throughout the                            procedure, the patient's blood pressure, pulse, and                            oxygen  saturations were monitored continuously. The                            Model CF-HQ190L [id number]) scope was introduced                            through the anus and advanced to the the cecum,                            identified by appendiceal orifice and ileocecal                            valve. The ileocecal valve, appendiceal orifice,                            and rectum were photographed. The quality of the                            bowel preparation was good. The colonoscopy was                            performed without difficulty. The patient tolerated                            the procedure well. The bowel preparation used was                            SUPREP. Scope In: 1:58:23 PM Scope Out: 2:10:00 PM Scope Withdrawal Time: 0 hours 8 minutes 36 seconds  Total Procedure Duration: 0 hours 11 minutes 37 seconds  Findings:                 Two polyps were found in the sigmoid colon and                            ascending colon. The polyps were 2 to 3 mm in size.  These polyps were removed with a cold snare.                            Resection and retrieval were complete.                           Internal hemorrhoids were found during                            retroflexion. The hemorrhoids were medium-sized.                           The exam was otherwise without abnormality on                            direct and retroflexion views. Complications:            No immediate complications. Estimated blood loss:                            None. Estimated Blood Loss:     Estimated blood loss: none. Impression:               - Two 2 to 3 mm polyps in the sigmoid colon and in                            the ascending colon, removed with a cold snare.                            Resected and retrieved.                           - Internal hemorrhoids.                           - The examination was otherwise normal on direct                             and retroflexion views. Recommendation:           - Repeat colonoscopy in 5 years for surveillance.                           - Resume Eliquis (apixaban) today at prior dose.                           - Await pathology results. Docia Chuck. Henrene Pastor, MD 11/22/2015 2:16:09 PM This report has been signed electronically. CC Letter to:             Ria Bush

## 2015-11-22 NOTE — Progress Notes (Signed)
Report given to PACU RN, vss 

## 2015-11-22 NOTE — Patient Instructions (Addendum)
YOU HAD AN ENDOSCOPIC PROCEDURE TODAY AT [facility]:   Refer to the procedure report that was given to you for any specific questions about what was found during the examination.  If the procedure report does not answer your questions, please call your gastroenterologist to clarify.  If you requested that your care partner not be given the details of your procedure findings, then the procedure report has been included in a sealed envelope for you to review at your convenience later.  YOU SHOULD EXPECT: Some feelings of bloating in the abdomen. Passage of more gas than usual.  Walking can help get rid of the air that was put into your GI tract during the procedure and reduce the bloating. If you had a lower endoscopy (such as a colonoscopy or flexible sigmoidoscopy) you may notice spotting of blood in your stool or on the toilet paper. If you underwent a bowel prep for your procedure, you may not have a normal bowel movement for a few days.  Please Note:  You might notice some irritation and congestion in your nose or some drainage.  This is from the oxygen used during your procedure.  There is no need for concern and it should clear up in a day or so.  SYMPTOMS TO REPORT IMMEDIATELY:   Following lower endoscopy (colonoscopy or flexible sigmoidoscopy):  Excessive amounts of blood in the stool  Significant tenderness or worsening of abdominal pains  Swelling of the abdomen that is new, acute  Fever of 100F or higher   For urgent or emergent issues, a gastroenterologist can be reached at any hour by calling [phone].   DIET: Your first meal following the procedure should be a small meal and then it is ok to progress to your normal diet. Heavy or fried foods are harder to digest and may make you feel nauseous or bloated.  Likewise, meals heavy in dairy and vegetables can increase bloating.  Drink plenty of fluids but you should avoid alcoholic beverages for 24  hours.  ACTIVITY:  You should plan to take it easy for the rest of today and you should NOT DRIVE or use heavy machinery until tomorrow (because of the sedation medicines used during the test).    FOLLOW UP: Our staff will call the number listed on your records the next business day following your procedure to check on you and address any questions or concerns that you may have regarding the information given to you following your procedure. If we do not reach you, we will leave a message.  However, if you are feeling well and you are not experiencing any problems, there is no need to return our call.  We will assume that you have returned to your regular daily activities without incident.  If any biopsies were taken you will be contacted by phone or by letter within the next 1-3 weeks.  Please call us at [phone] if you have not heard about the biopsies in 3 weeks.    SIGNATURES/CONFIDENTIALITY: You and/or your care partner have signed paperwork which will be entered into your electronic medical record.  These signatures attest to the fact that that the information above on your After Visit Summary has been reviewed and is understood.  Full responsibility of the confidentiality of this discharge information lies with you and/or your care-partner.  Polyps, hemorrhoids-handouts given.  Resume Eliquis today   Repeat colonoscopy in 5 years 2022.

## 2015-11-22 NOTE — Progress Notes (Signed)
Called to room to assist during endoscopic procedure.  Patient ID and intended procedure confirmed with present staff. Received instructions for my participation in the procedure from the performing physician.  

## 2015-11-23 ENCOUNTER — Encounter: Payer: Self-pay | Admitting: Family Medicine

## 2015-11-25 ENCOUNTER — Telehealth: Payer: Self-pay | Admitting: *Deleted

## 2015-11-25 NOTE — Telephone Encounter (Signed)
  Follow up Call-  Call back number 11/22/2015  Post procedure Call Back phone  # [phone]  Permission to leave phone message Yes     Patient questions:  Do you have a fever, pain , or abdominal swelling? No. Pain Score  0 *  Have you tolerated food without any problems? Yes.    Have you been able to return to your normal activities? Yes.    Do you have any questions about your discharge instructions: Diet   No. Medications  No. Follow up visit  No.  Do you have questions or concerns about your Care? No.  Actions: * If pain score is 4 or above: No action needed, pain <4.

## 2015-11-26 ENCOUNTER — Encounter: Payer: Self-pay | Admitting: Internal Medicine

## 2015-11-29 ENCOUNTER — Encounter: Payer: Self-pay | Admitting: Family Medicine

## 2015-12-01 ENCOUNTER — Other Ambulatory Visit: Payer: Self-pay | Admitting: Cardiovascular Disease

## 2015-12-02 NOTE — Telephone Encounter (Signed)
Rx(s) sent to pharmacy electronically.  

## 2015-12-09 ENCOUNTER — Telehealth: Payer: Self-pay | Admitting: Internal Medicine

## 2015-12-09 NOTE — Telephone Encounter (Signed)
Appointment made in error, it has been cancelled and pt is aware.

## 2015-12-12 DIAGNOSIS — R5382 Chronic fatigue, unspecified: Secondary | ICD-10-CM | POA: Diagnosis not present

## 2015-12-12 DIAGNOSIS — M2550 Pain in unspecified joint: Secondary | ICD-10-CM | POA: Diagnosis not present

## 2015-12-12 DIAGNOSIS — M7989 Other specified soft tissue disorders: Secondary | ICD-10-CM | POA: Diagnosis not present

## 2015-12-12 DIAGNOSIS — R768 Other specified abnormal immunological findings in serum: Secondary | ICD-10-CM | POA: Diagnosis not present

## 2015-12-16 ENCOUNTER — Encounter: Payer: Self-pay | Admitting: Family Medicine

## 2015-12-16 ENCOUNTER — Other Ambulatory Visit: Payer: Self-pay | Admitting: Cardiovascular Disease

## 2015-12-16 DIAGNOSIS — M069 Rheumatoid arthritis, unspecified: Secondary | ICD-10-CM | POA: Insufficient documentation

## 2015-12-16 NOTE — Telephone Encounter (Signed)
Rx request sent to pharmacy.  

## 2015-12-26 DIAGNOSIS — M7989 Other specified soft tissue disorders: Secondary | ICD-10-CM | POA: Diagnosis not present

## 2015-12-26 DIAGNOSIS — M2550 Pain in unspecified joint: Secondary | ICD-10-CM | POA: Diagnosis not present

## 2015-12-26 DIAGNOSIS — M0579 Rheumatoid arthritis with rheumatoid factor of multiple sites without organ or systems involvement: Secondary | ICD-10-CM | POA: Diagnosis not present

## 2015-12-26 DIAGNOSIS — R5382 Chronic fatigue, unspecified: Secondary | ICD-10-CM | POA: Diagnosis not present

## 2015-12-26 DIAGNOSIS — Z1589 Genetic susceptibility to other disease: Secondary | ICD-10-CM | POA: Diagnosis not present

## 2015-12-27 ENCOUNTER — Other Ambulatory Visit: Payer: Self-pay | Admitting: Cardiovascular Disease

## 2016-01-06 ENCOUNTER — Encounter: Payer: Self-pay | Admitting: Family Medicine

## 2016-01-06 ENCOUNTER — Ambulatory Visit: Payer: Commercial Managed Care - HMO | Admitting: Internal Medicine

## 2016-01-06 ENCOUNTER — Other Ambulatory Visit: Payer: Self-pay | Admitting: Family Medicine

## 2016-01-06 ENCOUNTER — Other Ambulatory Visit: Payer: Self-pay | Admitting: Cardiovascular Disease

## 2016-01-28 ENCOUNTER — Other Ambulatory Visit: Payer: Self-pay | Admitting: Cardiovascular Disease

## 2016-01-28 NOTE — Telephone Encounter (Signed)
Rx(s) sent to pharmacy electronically.  

## 2016-02-06 DIAGNOSIS — M0579 Rheumatoid arthritis with rheumatoid factor of multiple sites without organ or systems involvement: Secondary | ICD-10-CM | POA: Diagnosis not present

## 2016-02-06 DIAGNOSIS — Z79899 Other long term (current) drug therapy: Secondary | ICD-10-CM | POA: Diagnosis not present

## 2016-02-06 DIAGNOSIS — Z1589 Genetic susceptibility to other disease: Secondary | ICD-10-CM | POA: Diagnosis not present

## 2016-02-06 DIAGNOSIS — M2550 Pain in unspecified joint: Secondary | ICD-10-CM | POA: Diagnosis not present

## 2016-03-10 ENCOUNTER — Telehealth: Payer: Self-pay | Admitting: Family Medicine

## 2016-03-10 NOTE — Telephone Encounter (Signed)
LM for pt to sch AWV and CPE, mn °

## 2016-03-24 DIAGNOSIS — M2550 Pain in unspecified joint: Secondary | ICD-10-CM | POA: Diagnosis not present

## 2016-03-24 DIAGNOSIS — Z1589 Genetic susceptibility to other disease: Secondary | ICD-10-CM | POA: Diagnosis not present

## 2016-03-24 DIAGNOSIS — M0579 Rheumatoid arthritis with rheumatoid factor of multiple sites without organ or systems involvement: Secondary | ICD-10-CM | POA: Diagnosis not present

## 2016-03-24 DIAGNOSIS — Z79899 Other long term (current) drug therapy: Secondary | ICD-10-CM | POA: Diagnosis not present

## 2016-04-22 ENCOUNTER — Telehealth: Payer: Self-pay | Admitting: Cardiovascular Disease

## 2016-04-22 NOTE — Telephone Encounter (Signed)
When does pt need appointment with Dr Gwenlyn Found or PA?

## 2016-04-22 NOTE — Telephone Encounter (Signed)
Spoke with patient. She could not remember if she was supposed to f/u in 6 months or 1 year. Last office visit note with Dr Gwenlyn Found said with him in 1 year. Patient said she had no complaints at this time and thought next year would be fine. Recall entered into EPIC for f/u next year.

## 2016-04-23 ENCOUNTER — Ambulatory Visit (INDEPENDENT_AMBULATORY_CARE_PROVIDER_SITE_OTHER): Payer: Commercial Managed Care - HMO

## 2016-04-23 ENCOUNTER — Other Ambulatory Visit: Payer: Self-pay | Admitting: Family Medicine

## 2016-04-23 VITALS — BP 140/76 | HR 59 | Temp 97.9°F | Ht 62.5 in | Wt 174.0 lb

## 2016-04-23 DIAGNOSIS — R7309 Other abnormal glucose: Secondary | ICD-10-CM

## 2016-04-23 DIAGNOSIS — R718 Other abnormality of red blood cells: Secondary | ICD-10-CM | POA: Diagnosis not present

## 2016-04-23 DIAGNOSIS — Z1159 Encounter for screening for other viral diseases: Secondary | ICD-10-CM | POA: Diagnosis not present

## 2016-04-23 DIAGNOSIS — Z0000 Encounter for general adult medical examination without abnormal findings: Secondary | ICD-10-CM

## 2016-04-23 DIAGNOSIS — E559 Vitamin D deficiency, unspecified: Secondary | ICD-10-CM | POA: Diagnosis not present

## 2016-04-23 DIAGNOSIS — Z23 Encounter for immunization: Secondary | ICD-10-CM

## 2016-04-23 DIAGNOSIS — E039 Hypothyroidism, unspecified: Secondary | ICD-10-CM

## 2016-04-23 LAB — BASIC METABOLIC PANEL
BUN: 16 mg/dL (ref 6–23)
CALCIUM: 9.8 mg/dL (ref 8.4–10.5)
CO2: 29 mEq/L (ref 19–32)
CREATININE: 0.77 mg/dL (ref 0.40–1.20)
Chloride: 101 mEq/L (ref 96–112)
GFR: 95.62 mL/min (ref >60.00)
GLUCOSE: 93 mg/dL (ref 70–99)
Potassium: 3.5 mEq/L (ref 3.5–5.1)
SODIUM: 140 meq/L (ref 135–145)

## 2016-04-23 LAB — CBC WITH DIFFERENTIAL/PLATELET
BASOS PCT: 0.4 % (ref 0.0–3.0)
Basophils Absolute: 0 10*3/uL (ref 0.0–0.1)
EOS PCT: 1.1 % (ref 0.0–5.0)
Eosinophils Absolute: 0.1 10*3/uL (ref 0.0–0.7)
HCT: 36.8 % (ref 36.0–46.0)
Hemoglobin: 11.6 g/dL — ABNORMAL LOW (ref 12.0–15.0)
LYMPHS ABS: 1.1 10*3/uL (ref 0.7–4.0)
Lymphocytes Relative: 18.3 % (ref 12.0–46.0)
MCHC: 31.7 g/dL (ref 30.0–36.0)
MCV: 79.9 fl (ref 78.0–100.0)
MONO ABS: 0.4 10*3/uL (ref 0.1–1.0)
Monocytes Relative: 7.1 % (ref 3.0–12.0)
NEUTROS PCT: 73.1 % (ref 43.0–77.0)
Neutro Abs: 4.6 10*3/uL (ref 1.4–7.7)
Platelets: 215 10*3/uL (ref 150.0–400.0)
RBC: 4.61 Mil/uL (ref 3.87–5.11)
RDW: 17 % — AB (ref 11.5–15.5)
WBC: 6.3 10*3/uL (ref 4.0–10.5)

## 2016-04-23 LAB — TSH: TSH: 1.22 u[IU]/mL (ref 0.35–4.50)

## 2016-04-23 LAB — VITAMIN D 25 HYDROXY (VIT D DEFICIENCY, FRACTURES): VITD: 34.56 ng/mL (ref 30.00–100.00)

## 2016-04-23 NOTE — Progress Notes (Signed)
Pre visit review using our clinic review tool, if applicable. No additional management support is needed unless otherwise documented below in the visit note. 

## 2016-04-23 NOTE — Progress Notes (Signed)
Subjective:   Joanna Hood is a 70 y.o. female who presents for Medicare Annual (Subsequent) preventive examination.  Review of Systems:  N/A Cardiac Risk Factors include: advanced age (>38mn, >>39women);obesity (BMI >30kg/m2);hypertension     Objective:     Vitals: BP 140/76 (BP Location: Right Arm, Patient Position: Sitting, Cuff Size: Normal)   Pulse (!) 59   Temp 97.9 F (36.6 C) (Oral)   Ht 5' 2.5" (1.588 m) Comment: no shoes  Wt 174 lb (78.9 kg)   SpO2 99%   BMI 31.32 kg/m   Body mass index is 31.32 kg/m.   Tobacco History  Smoking Status  . Former Smoker  . Quit date: 05/16/1969  Smokeless Tobacco  . 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 Atrial fibrillation with RVR ([facility] [place]   persistent  . Chronic diastolic CHF (congestive heart failure) (HCC)    HTN  . Goiter   . Heart murmur   . History of anemia    unclear cause, now resolved  . History of chicken pox   . History of colon polyps    benign  . HLD (hyperlipidemia)   . Hypothyroidism   . Malignant hypertension longstanding  . Microcytosis   . Osteopenia 12/2013   T -2.3 hip  . Osteoporosis   . [facility]oid [facility] ([facility] 11[place]   +RF, +CCP, ESR 49, synovitis on exam and [place] [facility]  . Right lower lobe pneumonia ([facility] [place] 07[place]  . SOB (shortness of breath) 04/29/2013  . Vitamin D deficiency    Past Surgical History:  Procedure Laterality Date  . CARDIAC CATHETERIZATION  2014   normal per patient  . COLONOSCOPY  11/2015   TA, int hem, rpt 5 yrs (Henrene Pastor  . LEFT HEART CATHETERIZATION WITH CORONARY ANGIOGRAM N/A 05/01/2013   Procedure: LEFT HEART CATHETERIZATION WITH CORONARY ANGIOGRAM;  Surgeon: DLeonie Man MD;  [facility];  Service: Cardiovascular;  Laterality: N/A;  . PARTIAL HYSTERECTOMY  1970   fibroids   Family History  Problem Relation Age of Onset  . Cancer Mother     ovarian  . Sudden death Mother   .  Diabetes Sister   . Cancer Maternal Grandmother     lung  . Heart disease Maternal Grandmother   . Hypertension Sister   . Hypertension Cousin   . CAD Neg Hx   . Stroke Neg Hx    History  Sexual Activity  . Sexual activity: No    Outpatient Encounter Prescriptions as of 04/23/2016  Medication Sig  . amLODipine (NORVASC) 10 MG tablet Take 1 tablet (10 mg total) by mouth daily.  . Calcium Carb-Cholecalciferol (CALCIUM-VITAMIN D) 600-400 MG-UNIT TABS Take 1 tablet by mouth daily.  . cholecalciferol (VITAMIN D) 1000 UNITS tablet Take 1,000 Units by mouth daily.  . diclofenac sodium (VOLTAREN) 1 % GEL Apply 1 application topically 3 (three) times daily.  .Marland KitchenELIQUIS 5 MG TABS tablet TAKE 1 TABLET TWICE DAILY  . furosemide (LASIX) 20 MG tablet TAKE 1 TABLET EVERY DAY  . levothyroxine (SYNTHROID, LEVOTHROID) 150 MCG tablet TAKE 1 TABLET BY MOUTH EVERY MORNING BEFORE BREAKFAST  . methotrexate (RHEUMATREX) 2.5 MG tablet Take 15 mg by mouth once a week. Caution:Chemotherapy. Protect from light.  . metoprolol tartrate (LOPRESSOR) 25 MG tablet Take 1 tablet (25 mg total) by mouth 2 (two) times daily.  . pantoprazole (PROTONIX) 40 MG tablet TAKE 1 TABLET EVERY  DAY  . potassium chloride SA (K-DUR,KLOR-CON) 20 MEQ tablet TAKE 1 TABLET EVERY DAY  . ramipril (ALTACE) 10 MG capsule TAKE 1 CAPSULE TWICE DAILY   No facility-administered encounter medications on file as of 04/23/2016.     Activities of Daily Living In your present state of health, do you have any difficulty performing the following activities: 04/23/2016  Hearing? N  Vision? N  Difficulty concentrating or making decisions? N  Walking or climbing stairs? N  Dressing or bathing? N  Doing errands, shopping? N  Preparing Food and eating ? N  Using the Toilet? N  In the past six months, have you accidently leaked urine? N  Do you have problems with loss of bowel control? N  Managing your Medications? N  Managing your Finances? N    Housekeeping or managing your Housekeeping? N  Some recent data might be hidden    Patient Care Team: Ria Bush, MD as PCP - General (Family Medicine)  Quay Burow, MD - Cardiology Corrin Parker, MD - Rheumatology Dr. Hassell Done - Optometry Assessment:     Hearing Screening   _0  _1  _2  _3  _4  _5  _6  _7  _8   Right ear:   40 40 40  40    Left ear:   0 0 40  0      Visual Acuity Screening   Right eye Left eye Both eyes  Without correction:     With correction: _9    Exercise Activities and Dietary recommendations Current Exercise Habits: Home exercise routine, Type of exercise: walking, Time (Minutes): 60, Frequency (Times/Week): 3, Weekly Exercise (Minutes/Week): 180, Intensity: Mild, Exercise limited by: None identified  Goals    . Increase physical activity          Starting 04/23/2016, I will continue to walk at least 60 min 3 days per week.       Fall Risk Fall Risk  04/23/2016 04/23/2015 11/03/2013  Falls in the past year? No No No   Depression Screen PHQ 2/9 Scores 04/23/2016 04/23/2015 11/03/2013  PHQ - 2 Score 0 0 0     Cognitive Function MMSE - Mini Mental State Exam 04/23/2016  Orientation to time 5  Orientation to Place 5  Registration 3  Attention/ Calculation 0  Recall 3  Language- name 2 objects 0  Language- repeat 1  Language- follow 3 step command 3  Language- read & follow direction 0  Write a sentence 0  Copy design 0  Total score 20     PLEASE NOTE: A Mini-Cog screen was completed. Maximum score is 20. A value of 0 denotes this part of Folstein MMSE was not completed or the patient failed this part of the Mini-Cog screening.   Mini-Cog Screening Orientation to Time - Max 5 pts Orientation to Place - Max 5 pts Registration - Max 3 pts Recall - Max 3 pts Language Repeat - Max 1 pts Language Follow 3 Step Command - Max 3 pts     Immunization History  Administered Date(s) Administered  . Influenza  Whole 07/05/2009, 05/14/2010  . Influenza, High Dose Seasonal PF 04/03/2014  . Influenza,inj,Quad PF,36+ Mos 04/30/2013, 04/23/2016  . Influenza-Unspecified 04/12/2015  . Pneumococcal Conjugate-13 04/23/2015  . Pneumococcal Polysaccharide-23 04/30/2013  . Td 06/23/2003  . Tdap 04/23/2016   Screening Tests Health Maintenance  Topic Date Due  . MAMMOGRAM  06/21/2016 (Originally 11/13/2015)  . ZOSTAVAX  04/23/2017 (Originally 05/31/2007)  . DTaP/Tdap/Td (1 - Tdap) 04/23/2026 (Originally 06/24/2003)  . COLONOSCOPY  11/21/2025  . TETANUS/TDAP  04/23/2026  . INFLUENZA VACCINE  Completed  . DEXA SCAN  Completed  . Hepatitis C Screening  Completed  . PNA vac Low Risk Adult  Completed      Plan:     I have personally reviewed and addressed the Medicare Annual Wellness questionnaire and have noted the following in the patient's chart:  A. Medical and social history B. Use of alcohol, tobacco or illicit drugs  C. Current medications and supplements D. Functional ability and status E.  Nutritional status F.  Physical activity G. Advance directives H. List of other physicians I.  Hospitalizations, surgeries, and ER visits in previous 12 months J.  [place] to include hearing, vision, cognitive, depression L. Referrals and appointments - none  In addition, I have reviewed and discussed with patient certain preventive protocols, quality metrics, and best practice recommendations. A written personalized care plan for preventive services as well as general preventive health recommendations were provided to patient.  See attached scanned questionnaire for additional information.   Signed,   Lindell Noe, MHA, BS, LPN Health Coach

## 2016-04-23 NOTE — Progress Notes (Signed)
PCP notes:   Health maintenance:  Flu vaccine - administered TDap vaccine- administered Hep C screening - completed Mammogram - postponed/pt will schedule appt  Abnormal screenings:   Hearing - failed  Patient concerns:   None  Nurse concerns:  None  Next PCP appt:   04/29/16 @ 1030

## 2016-04-23 NOTE — Patient Instructions (Signed)
Ms. Didonna , Thank you for taking time to come for your Medicare Wellness Visit. I appreciate your ongoing commitment to your health goals. Please review the following plan we discussed and let me know if I can assist you in the future.   These are the goals we discussed: Goals    . Increase physical activity          Starting 04/23/2016, I will continue to walk at least 60 min 3 days per week.        This is a list of the screening recommended for you and due dates:  Health Maintenance  Topic Date Due  . Mammogram  06/21/2016*  . Shingles Vaccine  04/23/2017*  . DTaP/Tdap/Td vaccine (1 - Tdap) 04/23/2026*  . Colon Cancer Screening  11/21/2025  . Tetanus Vaccine  04/23/2026  . Flu Shot  Completed  . DEXA scan (bone density measurement)  Completed  .  Hepatitis C: One time screening is recommended by Center for Disease Control  (CDC) for  adults born from [birth year] through 1964.   Completed  . Pneumonia vaccines  Completed  *Topic was postponed. The date shown is not the original due date.   Preventive Care for Adults  A healthy lifestyle and preventive care can promote health and wellness. Preventive health guidelines for adults include the following key practices.  . A routine yearly physical is a good way to check with your health care provider about your health and preventive screening. It is a chance to share any concerns and updates on your health and to receive a thorough exam.  . Visit your dentist for a routine exam and preventive care every 6 months. Brush your teeth twice a day and floss once a day. Good oral hygiene prevents tooth decay and gum disease.  . The frequency of eye exams is based on your age, health, family medical history, use  of contact lenses, and other factors. Follow your health care provider's ecommendations for frequency of eye exams.  . Eat a healthy diet. Foods like vegetables, fruits, whole grains, low-fat dairy products, and lean protein foods  contain the nutrients you need without too many calories. Decrease your intake of foods high in solid fats, added sugars, and salt. Eat the right amount of calories for you. Get information about a proper diet from your health care provider, if necessary.  . Regular physical exercise is one of the most important things you can do for your health. Most adults should get at least 150 minutes of moderate-intensity exercise (any activity that increases your heart rate and causes you to sweat) each week. In addition, most adults need muscle-strengthening exercises on 2 or more days a week.  Silver Sneakers may be a benefit available to you. To determine eligibility, you may visit the website: www.silversneakers.com or contact program at 272-801-0186 Mon-Fri between 8AM-8PM.   . Maintain a healthy weight. The body mass index (BMI) is a screening tool to identify possible weight problems. It provides an estimate of body fat based on height and weight. Your health care provider can find your BMI and can help you achieve or maintain a healthy weight.   For adults 20 years and older: ? A BMI below 18.5 is considered underweight. ? A BMI of 18.5 to 24.9 is normal. ? A BMI of 25 to 29.9 is considered overweight. ? A BMI of 30 and above is considered obese.   . Maintain normal blood lipids and cholesterol levels by exercising and  minimizing your intake of saturated fat. Eat a balanced diet with plenty of fruit and vegetables. Blood tests for lipids and cholesterol should begin at age 33 and be repeated every 5 years. If your lipid or cholesterol levels are high, you are over 50, or you are at high risk for heart disease, you may need your cholesterol levels checked more frequently. Ongoing high lipid and cholesterol levels should be treated with medicines if diet and exercise are not working.  . If you smoke, find out from your health care provider how to quit. If you do not use tobacco, please do not  start.  . If you choose to drink alcohol, please do not consume more than 2 drinks per day. One drink is considered to be 12 ounces (355 mL) of beer, 5 ounces (148 mL) of wine, or 1.5 ounces (44 mL) of liquor.  . If you are 57-49 years old, ask your health care provider if you should take aspirin to prevent strokes.  . Use sunscreen. Apply sunscreen liberally and repeatedly throughout the day. You should seek shade when your shadow is shorter than you. Protect yourself by wearing long sleeves, pants, a wide-brimmed hat, and sunglasses year round, whenever you are outdoors.  . Once a month, do a whole body skin exam, using a mirror to look at the skin on your back. Tell your health care provider of new moles, moles that have irregular borders, moles that are larger than a pencil eraser, or moles that have changed in shape or color.

## 2016-04-24 LAB — HEPATITIS C ANTIBODY: HCV AB: NEGATIVE

## 2016-04-26 NOTE — Progress Notes (Signed)
I reviewed health advisor's note, was available for consultation, and agree with documentation and plan.  

## 2016-04-29 ENCOUNTER — Encounter: Payer: Self-pay | Admitting: Family Medicine

## 2016-04-29 ENCOUNTER — Ambulatory Visit (INDEPENDENT_AMBULATORY_CARE_PROVIDER_SITE_OTHER): Payer: Commercial Managed Care - HMO | Admitting: Family Medicine

## 2016-04-29 VITALS — BP 148/84 | HR 68 | Temp 97.5°F | Wt 174.0 lb

## 2016-04-29 DIAGNOSIS — Z0000 Encounter for general adult medical examination without abnormal findings: Secondary | ICD-10-CM

## 2016-04-29 DIAGNOSIS — I481 Persistent atrial fibrillation: Secondary | ICD-10-CM

## 2016-04-29 DIAGNOSIS — Z7189 Other specified counseling: Secondary | ICD-10-CM

## 2016-04-29 DIAGNOSIS — E039 Hypothyroidism, unspecified: Secondary | ICD-10-CM | POA: Diagnosis not present

## 2016-04-29 DIAGNOSIS — I4819 Other persistent atrial fibrillation: Secondary | ICD-10-CM

## 2016-04-29 DIAGNOSIS — D509 Iron deficiency anemia, unspecified: Secondary | ICD-10-CM

## 2016-04-29 DIAGNOSIS — M8580 Other specified disorders of bone density and structure, unspecified site: Secondary | ICD-10-CM

## 2016-04-29 DIAGNOSIS — M0579 Rheumatoid arthritis with rheumatoid factor of multiple sites without organ or systems involvement: Secondary | ICD-10-CM

## 2016-04-29 DIAGNOSIS — E559 Vitamin D deficiency, unspecified: Secondary | ICD-10-CM

## 2016-04-29 DIAGNOSIS — I10 Essential (primary) hypertension: Secondary | ICD-10-CM

## 2016-04-29 NOTE — Assessment & Plan Note (Signed)
Discussed importance of regular weight bearing exercises as well as goal calcium and vit D intake.

## 2016-04-29 NOTE — Assessment & Plan Note (Signed)
Appreciate rheum Trudie Reed) care of patient

## 2016-04-29 NOTE — Assessment & Plan Note (Signed)
Replaced - continue 1000 IU daily.

## 2016-04-29 NOTE — Assessment & Plan Note (Signed)
Preventative protocols reviewed and updated unless pt declined. Discussed healthy diet and lifestyle.  

## 2016-04-29 NOTE — Progress Notes (Signed)
BP (!) 148/84   Pulse 68   Temp 97.5 F (36.4 C) (Oral)   Wt 174 lb (78.9 kg)   BMI 31.32 kg/m    CC: CPE Subjective:    Patient ID: Joanna Hood, female    DOB: 14-Jul-1945, 70 y.o.   MRN: [id number]  HPI: Hilinai Saccente is a 70 y.o. female presenting on 04/29/2016 for Annual Exam   Saw Katha Cabal last week for medicare wellness visit, note reviewed. Failed hearing screen on left. Pt hasn't noticed any trouble.   Recent RA dx - on MTX.  On PPI for prevention - started when eliquis was started. No h/o GERD or GIB.   Preventative:  COLONOSCOPY 11/2015 - TA, int hem, rpt 5 yrs Henrene Pastor) Well woman exam - partial hysterectomy 1970, ovaries remain.All normal pap smears. Discussed intermittent pelvic exams. Pt declines today.  Mammogram 10/2014 [facility] 1.  DEXA 12/2013 - T-2.3 osteopenia - pt gets calcium/vitamin D daily and walks regularly.  Flu shot - yearly Pneumovax 04/2013, prevnar 2016 Td 2005 zostavax - not interested  Advanced directives: HCPOA would be daughter.Packet at home, encouraged she work on this.  Seat belt use discussed Sunscreen use discussed. 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 Interim medical history since our last visit reviewed. Allergies and medications reviewed and updated. Current Outpatient Prescriptions on File Prior to Visit  Medication Sig  . amLODipine (NORVASC) 10 MG tablet Take 1 tablet (10 mg total) by mouth daily.  . Calcium Carb-Cholecalciferol (CALCIUM-VITAMIN D) 600-400 MG-UNIT TABS Take 1 tablet by mouth daily.  . cholecalciferol (VITAMIN D) 1000 UNITS tablet Take 1,000 Units by mouth daily.  . diclofenac  sodium (VOLTAREN) 1 % GEL Apply 1 application topically 3 (three) times daily.  Marland Kitchen ELIQUIS 5 MG TABS tablet TAKE 1 TABLET TWICE DAILY  . furosemide (LASIX) 20 MG tablet TAKE 1 TABLET EVERY DAY  . levothyroxine (SYNTHROID, LEVOTHROID) 150 MCG tablet TAKE 1 TABLET BY MOUTH EVERY MORNING BEFORE BREAKFAST  . methotrexate (RHEUMATREX) 2.5 MG tablet Take 15 mg by mouth once a week. Caution:Chemotherapy. Protect from light.  . metoprolol tartrate (LOPRESSOR) 25 MG tablet Take 1 tablet (25 mg total) by mouth 2 (two) times daily.  . potassium chloride SA (K-DUR,KLOR-CON) 20 MEQ tablet TAKE 1 TABLET EVERY DAY  . ramipril (ALTACE) 10 MG capsule TAKE 1 CAPSULE TWICE DAILY   No current facility-administered medications on file prior to visit.     Review of Systems  Constitutional: Negative for activity change, appetite change, chills, fatigue, fever and unexpected weight change.  HENT: Negative for hearing loss.   Eyes: Negative for visual disturbance.  Respiratory: Negative for cough, chest tightness, shortness of breath and wheezing.   Cardiovascular: Negative for chest pain, palpitations and leg swelling.  Gastrointestinal: Negative for abdominal distention, abdominal pain, blood in stool, constipation, diarrhea, nausea and vomiting.  Genitourinary: Negative for difficulty urinating and hematuria.  Musculoskeletal: Negative for arthralgias, myalgias and neck pain.  Skin: Negative for rash.  Neurological: Negative for dizziness, seizures, syncope and headaches.  Hematological: Negative for adenopathy. Does not bruise/bleed easily.  Psychiatric/Behavioral: Negative for dysphoric mood. The patient is not nervous/anxious.    Per HPI unless specifically indicated in ROS section  Objective:    BP (!) 148/84   Pulse 68   Temp 97.5 F (36.4 C) (Oral)   Wt 174 lb (78.9 kg)   BMI 31.32 kg/m   Wt Readings from Last 3 Encounters:  04/29/16 174 lb (78.9 kg)  04/23/16 174 lb (78.9 kg)  11/22/15  177 lb (80.3 kg)    Physical Exam  Constitutional: She is oriented to person, place, and time. She appears well-developed and well-nourished. No distress.  HENT:  Head: Normocephalic and atraumatic.  Right Ear: Hearing, tympanic membrane, external ear and ear canal normal.  Left Ear: Hearing, tympanic membrane, external ear and ear canal normal.  Nose: Nose normal.  Mouth/Throat: Uvula is midline, oropharynx is clear and moist and mucous membranes are normal. No oropharyngeal exudate, posterior oropharyngeal edema or posterior oropharyngeal erythema.  Eyes: Conjunctivae and EOM are normal. Pupils are equal, round, and reactive to light. No scleral icterus.  Neck: Normal range of motion. Neck supple. Carotid bruit is not present. No thyromegaly present.  Cardiovascular: Normal rate and intact distal pulses.  An irregularly irregular rhythm present.  Murmur (3/6 SEM) heard. Pulses:      Radial pulses are 2+ on the right side, and 2+ on the left side.  Pulmonary/Chest: Effort normal and breath sounds normal. No respiratory distress. She has no wheezes. She has no rales.  Abdominal: Soft. Bowel sounds are normal. She exhibits no distension and no mass. There is no tenderness. There is no rebound and no guarding.  Musculoskeletal: Normal range of motion. She exhibits no edema.  Lymphadenopathy:    She has no cervical adenopathy.  Neurological: She is alert and oriented to person, place, and time.  CN grossly intact, station and gait intact  Skin: Skin is warm and dry. No rash noted.  Psychiatric: She has a normal mood and affect. Her behavior is normal. Judgment and thought content normal.  Nursing note and vitals reviewed.  Results for orders placed or performed in visit on 04/23/16  CBC with Differential/Platelet  Result Value Ref Range   WBC 6.3 4.0 - 10.5 K/uL   RBC 4.61 3.87 - 5.11 Mil/uL   Hemoglobin 11.6 (L) 12.0 - 15.0 g/dL   HCT 36.8 36.0 - 46.0 %   MCV 79.9 78.0 - 100.0 fl    MCHC 31.7 30.0 - 36.0 g/dL   RDW 17.0 (H) 11.5 - 15.5 %   Platelets 215.0 150.0 - 400.0 K/uL   Neutrophils Relative % 73.1 43.0 - 77.0 %   Lymphocytes Relative 18.3 12.0 - 46.0 %   Monocytes Relative 7.1 3.0 - 12.0 %   Eosinophils Relative 1.1 0.0 - 5.0 %   Basophils Relative 0.4 0.0 - 3.0 %   Neutro Abs 4.6 1.4 - 7.7 K/uL   Lymphs Abs 1.1 0.7 - 4.0 K/uL   Monocytes Absolute 0.4 0.1 - 1.0 K/uL   Eosinophils Absolute 0.1 0.0 - 0.7 K/uL   Basophils Absolute 0.0 0.0 - 0.1 K/uL  Basic metabolic panel  Result Value Ref Range   Sodium 140 135 - 145 mEq/L   Potassium 3.5 3.5 - 5.1 mEq/L   Chloride 101 96 - 112 mEq/L   CO2 29 19 - 32 mEq/L   Glucose, Bld 93 70 - 99 mg/dL   BUN 16 6 - 23 mg/dL   Creatinine, Ser 0.77 0.40 - 1.20 mg/dL   Calcium 9.8 8.4 - 10.5 mg/dL   GFR 95.62 >60.00 mL/min  TSH  Result Value Ref Range   TSH  1.22 0.35 - 4.50 uIU/mL  VITAMIN D 25 Hydroxy (Vit-D Deficiency, Fractures)  Result Value Ref Range   VITD 34.56 30.00 - 100.00 ng/mL  Hepatitis C antibody  Result Value Ref Range   HCV Ab NEGATIVE NEGATIVE      Assessment & Plan:   Problem List Items Addressed This Visit    Advanced care planning/counseling discussion    Advanced directives: HCPOA would be daughter.Packet at home, encouraged she work on this.       Health maintenance examination - Primary    Preventative protocols reviewed and updated unless pt declined. Discussed healthy diet and lifestyle.       HTN (hypertension), malignant    Chronic, stable. Continue current regimen of ramipril, amlodipine, metoprolol. Followed by cards.       Hypothyroidism    Chronic, stable. Continue current regimen.      Microcytic anemia    Anticipate IDA. Update anemia panel next labs. Provided with iron rich food handout. She is on eliquis and I presume PPI preventatively. Reviewed D/C summary from 2014 hospitalization for afib with acute dCHF - no history of GIB.  I suggested she decrease protonix to  40mg  MWF. Will cc cards as fyi.       Osteopenia    Discussed importance of regular weight bearing exercises as well as goal calcium and vit D intake.       Persistent atrial fibrillation (HCC)    Rate controlled. Appreciate cards care. Continue eliquis.       Rheumatoid arthritis ([place])    Appreciate rheum Trudie Reed) care of patient       Vitamin D deficiency    Replaced - continue 1000 IU daily.          Follow up plan: Return in about 1 year (around 04/29/2017) for annual exam, prior fasting for blood work.  Ria Bush, MD

## 2016-04-29 NOTE — Assessment & Plan Note (Addendum)
Anticipate IDA. Update anemia panel next labs. Provided with iron rich food handout. She is on eliquis and I presume PPI preventatively. Reviewed D/C summary from 2014 hospitalization for afib with acute dCHF - no history of GIB.  I suggested she decrease protonix to 40mg  MWF. Will cc cards as fyi.

## 2016-04-29 NOTE — Assessment & Plan Note (Signed)
Advanced directives: HCPOA would be daughter.Packet at home, encouraged she work on this. 

## 2016-04-29 NOTE — Progress Notes (Signed)
Pre visit review using our clinic review tool, if applicable. No additional management support is needed unless otherwise documented below in the visit note. 

## 2016-04-29 NOTE — Patient Instructions (Addendum)
Call to schedule mammogram.  I'd suggest decreasing protonix to '40mg'$  MWF.  Try to increase iron rick foods in diet.  Return as needed or in 1 year for next physical.   Health Maintenance, Female Adopting a healthy lifestyle and getting preventive care can go a long way to promote health and wellness. Talk with your health care provider about what schedule of regular examinations is right for you. This is a good chance for you to check in with your provider about disease prevention and staying healthy. In between checkups, there are plenty of things you can do on your own. Experts have done a lot of research about which lifestyle changes and preventive measures are most likely to keep you healthy. Ask your health care provider for more information. WEIGHT AND DIET  Eat a healthy diet  Be sure to include plenty of vegetables, fruits, low-fat dairy products, and lean protein.  Do not eat a lot of foods high in solid fats, added sugars, or salt.  Get regular exercise. This is one of the most important things you can do for your health.  Most adults should exercise for at least 150 minutes each week. The exercise should increase your heart rate and make you sweat (moderate-intensity exercise).  Most adults should also do strengthening exercises at least twice a week. This is in addition to the moderate-intensity exercise.  Maintain a healthy weight  Body mass index (BMI) is a measurement that can be used to identify possible weight problems. It estimates body fat based on height and weight. Your health care provider can help determine your BMI and help you achieve or maintain a healthy weight.  For females 7 years of age and older:   A BMI below 18.5 is considered underweight.  A BMI of 18.5 to 24.9 is normal.  A BMI of 25 to 29.9 is considered overweight.  A BMI of 30 and above is considered obese.  Watch levels of cholesterol and blood lipids  You should start having your blood  tested for lipids and cholesterol at 69 years of age, then have this test every 5 years.  You may need to have your cholesterol levels checked more often if:  Your lipid or cholesterol levels are high.  You are older than 69 years of age.  You are at high risk for heart disease.  CANCER SCREENING   Lung Cancer  Lung cancer screening is recommended for adults 90-16 years old who are at high risk for lung cancer because of a history of smoking.  A yearly low-dose CT scan of the lungs is recommended for people who:  Currently smoke.  Have quit within the past 15 years.  Have at least a 30-pack-year history of smoking. A pack year is smoking an average of one pack of cigarettes a day for 1 year.  Yearly screening should continue until it has been 15 years since you quit.  Yearly screening should stop if you develop a health problem that would prevent you from having lung cancer treatment.  Breast Cancer  Practice breast self-awareness. This means understanding how your breasts normally appear and feel.  It also means doing regular breast self-exams. Let your health care provider know about any changes, no matter how small.  If you are in your 20s or 30s, you should have a clinical breast exam (CBE) by a health care provider every 1-3 years as part of a regular health exam.  If you are 39 or older, have a  CBE every year. Also consider having a breast X-ray (mammogram) every year.  If you have a family history of breast cancer, talk to your health care provider about genetic screening.  If you are at high risk for breast cancer, talk to your health care provider about having an MRI and a mammogram every year.  Breast cancer gene (BRCA) assessment is recommended for women who have family members with BRCA-related cancers. BRCA-related cancers include:  Breast.  Ovarian.  Tubal.  Peritoneal cancers.  Results of the assessment will determine the need for genetic counseling  and BRCA1 and BRCA2 testing. Cervical Cancer Your health care provider may recommend that you be screened regularly for cancer of the pelvic organs (ovaries, uterus, and vagina). This screening involves a pelvic examination, including checking for microscopic changes to the surface of your cervix (Pap test). You may be encouraged to have this screening done every 3 years, beginning at age 41.  For women ages 36-65, health care providers may recommend pelvic exams and Pap testing every 3 years, or they may recommend the Pap and pelvic exam, combined with testing for human papilloma virus (HPV), every 5 years. Some types of HPV increase your risk of cervical cancer. Testing for HPV may also be done on women of any age with unclear Pap test results.  Other health care providers may not recommend any screening for nonpregnant women who are considered low risk for pelvic cancer and who do not have symptoms. Ask your health care provider if a screening pelvic exam is right for you.  If you have had past treatment for cervical cancer or a condition that could lead to cancer, you need Pap tests and screening for cancer for at least 20 years after your treatment. If Pap tests have been discontinued, your risk factors (such as having a new sexual partner) need to be reassessed to determine if screening should resume. Some women have medical problems that increase the chance of getting cervical cancer. In these cases, your health care provider may recommend more frequent screening and Pap tests. Colorectal Cancer  This type of cancer can be detected and often prevented.  Routine colorectal cancer screening usually begins at 69 years of age and continues through 69 years of age.  Your health care provider may recommend screening at an earlier age if you have risk factors for colon cancer.  Your health care provider may also recommend using home test kits to check for hidden blood in the stool.  A small camera  at the end of a tube can be used to examine your colon directly (sigmoidoscopy or colonoscopy). This is done to check for the earliest forms of colorectal cancer.  Routine screening usually begins at age 52.  Direct examination of the colon should be repeated every 5-10 years through 69 years of age. However, you may need to be screened more often if early forms of precancerous polyps or small growths are found. Skin Cancer  Check your skin from head to toe regularly.  Tell your health care provider about any new moles or changes in moles, especially if there is a change in a mole's shape or color.  Also tell your health care provider if you have a mole that is larger than the size of a pencil eraser.  Always use sunscreen. Apply sunscreen liberally and repeatedly throughout the day.  Protect yourself by wearing long sleeves, pants, a wide-brimmed hat, and sunglasses whenever you are outside. HEART DISEASE, DIABETES, AND HIGH BLOOD  PRESSURE   High blood pressure causes heart disease and increases the risk of stroke. High blood pressure is more likely to develop in:  People who have blood pressure in the high end of the normal range (130-139/85-89 mm Hg).  People who are overweight or obese.  People who are African American.  If you are 18-39 years of age, have your blood pressure checked every 3-5 years. If you are 40 years of age or older, have your blood pressure checked every year. You should have your blood pressure measured twice--once when you are at a hospital or clinic, and once when you are not at a hospital or clinic. Record the average of the two measurements. To check your blood pressure when you are not at a hospital or clinic, you can use:  An automated blood pressure machine at a pharmacy.  A home blood pressure monitor.  If you are between 55 years and 79 years old, ask your health care provider if you should take aspirin to prevent strokes.  Have regular diabetes  screenings. This involves taking a blood sample to check your fasting blood sugar level.  If you are at a normal weight and have a low risk for diabetes, have this test once every three years after 69 years of age.  If you are overweight and have a high risk for diabetes, consider being tested at a younger age or more often. PREVENTING INFECTION  Hepatitis B  If you have a higher risk for hepatitis B, you should be screened for this virus. You are considered at high risk for hepatitis B if:  You were born in a country where hepatitis B is common. Ask your health care provider which countries are considered high risk.  Your parents were born in a high-risk country, and you have not been immunized against hepatitis B (hepatitis B vaccine).  You have HIV or AIDS.  You use needles to inject street drugs.  You live with someone who has hepatitis B.  You have had sex with someone who has hepatitis B.  You get hemodialysis treatment.  You take certain medicines for conditions, including cancer, organ transplantation, and autoimmune conditions. Hepatitis C  Blood testing is recommended for:  Everyone born from 1945 through 1965.  Anyone with known risk factors for hepatitis C. Sexually transmitted infections (STIs)  You should be screened for sexually transmitted infections (STIs) including gonorrhea and chlamydia if:  You are sexually active and are younger than 69 years of age.  You are older than 69 years of age and your health care provider tells you that you are at risk for this type of infection.  Your sexual activity has changed since you were last screened and you are at an increased risk for chlamydia or gonorrhea. Ask your health care provider if you are at risk.  If you do not have HIV, but are at risk, it may be recommended that you take a prescription medicine daily to prevent HIV infection. This is called pre-exposure prophylaxis (PrEP). You are considered at risk  if:  You are sexually active and do not regularly use condoms or know the HIV status of your partner(s).  You take drugs by injection.  You are sexually active with a partner who has HIV. Talk with your health care provider about whether you are at high risk of being infected with HIV. If you choose to begin PrEP, you should first be tested for HIV. You should then be tested every 3   months for as long as you are taking PrEP.  PREGNANCY   If you are premenopausal and you may become pregnant, ask your health care provider about preconception counseling.  If you may become pregnant, take 400 to 800 micrograms (mcg) of folic acid every day.  If you want to prevent pregnancy, talk to your health care provider about birth control (contraception). OSTEOPOROSIS AND MENOPAUSE   Osteoporosis is a disease in which the bones lose minerals and strength with aging. This can result in serious bone fractures. Your risk for osteoporosis can be identified using a bone density scan.  If you are 47 years of age or older, or if you are at risk for osteoporosis and fractures, ask your health care provider if you should be screened.  Ask your health care provider whether you should take a calcium or vitamin D supplement to lower your risk for osteoporosis.  Menopause may have certain physical symptoms and risks.  Hormone replacement therapy may reduce some of these symptoms and risks. Talk to your health care provider about whether hormone replacement therapy is right for you.  HOME CARE INSTRUCTIONS   Schedule regular health, dental, and eye exams.  Stay current with your immunizations.   Do not use any tobacco products including cigarettes, chewing tobacco, or electronic cigarettes.  If you are pregnant, do not drink alcohol.  If you are breastfeeding, limit how much and how often you drink alcohol.  Limit alcohol intake to no more than 1 drink per day for nonpregnant women. One drink equals 12  ounces of beer, 5 ounces of wine, or 1 ounces of hard liquor.  Do not use street drugs.  Do not share needles.  Ask your health care provider for help if you need support or information about quitting drugs.  Tell your health care provider if you often feel depressed.  Tell your health care provider if you have ever been abused or do not feel safe at home.   This information is not intended to replace advice given to you by your health care provider. Make sure you discuss any questions you have with your health care provider.   Document Released: 12/22/2010 Document Revised: 06/29/2014 Document Reviewed: 05/10/2013 Elsevier Interactive Patient Education [employer].

## 2016-04-29 NOTE — Assessment & Plan Note (Signed)
Rate controlled. Appreciate cards care. Continue eliquis.

## 2016-04-29 NOTE — Assessment & Plan Note (Signed)
Chronic, stable. Continue current regimen. 

## 2016-04-29 NOTE — Assessment & Plan Note (Signed)
Chronic, stable. Continue current regimen of ramipril, amlodipine, metoprolol. Followed by cards.

## 2016-06-24 DIAGNOSIS — Z1589 Genetic susceptibility to other disease: Secondary | ICD-10-CM | POA: Diagnosis not present

## 2016-06-24 DIAGNOSIS — M2550 Pain in unspecified joint: Secondary | ICD-10-CM | POA: Diagnosis not present

## 2016-06-24 DIAGNOSIS — Z79899 Other long term (current) drug therapy: Secondary | ICD-10-CM | POA: Diagnosis not present

## 2016-06-24 DIAGNOSIS — Z6830 Body mass index (BMI) 30.0-30.9, adult: Secondary | ICD-10-CM | POA: Diagnosis not present

## 2016-06-24 DIAGNOSIS — M0579 Rheumatoid arthritis with rheumatoid factor of multiple sites without organ or systems involvement: Secondary | ICD-10-CM | POA: Diagnosis not present

## 2016-06-24 DIAGNOSIS — E669 Obesity, unspecified: Secondary | ICD-10-CM | POA: Diagnosis not present

## 2016-07-28 ENCOUNTER — Other Ambulatory Visit: Payer: Self-pay | Admitting: Cardiovascular Disease

## 2016-07-28 ENCOUNTER — Other Ambulatory Visit: Payer: Self-pay | Admitting: Family Medicine

## 2016-08-10 ENCOUNTER — Other Ambulatory Visit: Payer: Self-pay | Admitting: Family Medicine

## 2016-09-22 DIAGNOSIS — E669 Obesity, unspecified: Secondary | ICD-10-CM | POA: Diagnosis not present

## 2016-09-22 DIAGNOSIS — M0579 Rheumatoid arthritis with rheumatoid factor of multiple sites without organ or systems involvement: Secondary | ICD-10-CM | POA: Diagnosis not present

## 2016-09-22 DIAGNOSIS — Z79899 Other long term (current) drug therapy: Secondary | ICD-10-CM | POA: Diagnosis not present

## 2016-09-22 DIAGNOSIS — Z1589 Genetic susceptibility to other disease: Secondary | ICD-10-CM | POA: Diagnosis not present

## 2016-09-22 DIAGNOSIS — M2550 Pain in unspecified joint: Secondary | ICD-10-CM | POA: Diagnosis not present

## 2016-09-22 DIAGNOSIS — Z6830 Body mass index (BMI) 30.0-30.9, adult: Secondary | ICD-10-CM | POA: Diagnosis not present

## 2016-10-06 ENCOUNTER — Other Ambulatory Visit: Payer: Self-pay | Admitting: Cardiovascular Disease

## 2016-10-16 ENCOUNTER — Ambulatory Visit (INDEPENDENT_AMBULATORY_CARE_PROVIDER_SITE_OTHER): Payer: Medicare HMO | Admitting: Cardiovascular Disease

## 2016-10-16 ENCOUNTER — Encounter: Payer: Self-pay | Admitting: Cardiovascular Disease

## 2016-10-16 VITALS — BP 142/88 | HR 68 | Ht 64.0 in | Wt 172.0 lb

## 2016-10-16 DIAGNOSIS — I481 Persistent atrial fibrillation: Secondary | ICD-10-CM

## 2016-10-16 DIAGNOSIS — I10 Essential (primary) hypertension: Secondary | ICD-10-CM | POA: Diagnosis not present

## 2016-10-16 DIAGNOSIS — I4819 Other persistent atrial fibrillation: Secondary | ICD-10-CM

## 2016-10-16 NOTE — Patient Instructions (Signed)

## 2016-10-16 NOTE — Assessment & Plan Note (Signed)
History of persistent atrial fibrillation rate controlled on beta blocker and anticoagulated on Eliquis . She is asymptomatic

## 2016-10-16 NOTE — Assessment & Plan Note (Signed)
History of hypertensive blood pressure measures 142/88. She is on amlodipine and metoprolol as well as ramipril. Continue current meds at current dosing

## 2016-10-16 NOTE — Progress Notes (Signed)
10/16/2016 Joanna Hood   April 27, 1946  [id number]  Primary Physician Ria Bush, MD Primary Cardiologist: Lorretta Harp MD Renae Gloss  HPI:  [name] is a delightful 71 year old moderately overweight African-American female who I initially met several years ago. I last saw her in the office 11/08/15. We first met when she was admitted to [facility] with diastolic heart failure, hypertension and atrial fibrillation. She ultimately underwent cardiac catheterization revealing essentially normal coronary arteries and normal LV function by Dr. Ellyn Hack. Her A. Fib converted to sinus rhythm with the aid of IV Cardizem. Her blood pressure was better controlled. She saw Ellen Henri PA-C in the office  and again was in atrial fibrillation at which time her beta blocker was adjusted and Eliquis was added because of increased CHA2DSVASC2 score of 2. Since I saw her in the office 05/01/14 she remained clinically stable. She denies chest pain or shortness of breath.  Current Outpatient Prescriptions  Medication Sig Dispense Refill  . amLODipine (NORVASC) 10 MG tablet TAKE 1 TABLET (10 MG TOTAL) BY MOUTH DAILY. 90 tablet 2  . Calcium Carb-Cholecalciferol (CALCIUM-VITAMIN D) 600-400 MG-UNIT TABS Take 1 tablet by mouth daily.    . cholecalciferol (VITAMIN D) 1000 UNITS tablet Take 1,000 Units by mouth daily.    . diclofenac sodium (VOLTAREN) 1 % GEL Apply 1 application topically 3 (three) times daily. 1 Tube 1  . ELIQUIS 5 MG TABS tablet TAKE 1 TABLET TWICE DAILY 180 tablet 3  . furosemide (LASIX) 20 MG tablet TAKE 1 TABLET EVERY DAY 60 tablet 0  . levothyroxine (SYNTHROID, LEVOTHROID) 150 MCG tablet TAKE 1 TABLET EVERY MORNING BEFORE BREAKFAST 90 tablet 3  . methotrexate (RHEUMATREX) 2.5 MG tablet Take 15 mg by mouth once a week. Caution:Chemotherapy. Protect from light.    . metoprolol tartrate (LOPRESSOR) 25 MG tablet TAKE 1 TABLET TWICE DAILY 60 tablet 0  .  pantoprazole (PROTONIX) 40 MG tablet Take 1 tablet (40 mg total) by mouth every Monday, Wednesday, and Friday.    . potassium chloride SA (K-DUR,KLOR-CON) 20 MEQ tablet TAKE 1 TABLET EVERY DAY 90 tablet 3  . ramipril (ALTACE) 10 MG capsule TAKE 1 CAPSULE TWICE DAILY 180 capsule 3   No current facility-administered medications for this visit.     Allergies  Allergen Reactions  . Metronidazole     REACTION: Rash, itching    Social History   Social History  . Marital status: Single    Spouse name: N/A  . Number of children: 1  . Years of education: N/A   Occupational History  . Retired    Social History Main Topics  . Smoking status: Former Smoker    Quit date: 05/16/1969  . Smokeless tobacco: Former Systems developer  . Alcohol use No  . Drug use: No  . Sexual activity: No   Other Topics Concern  . 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  Cardiovascular: negative for chest pain, dyspnea on exertion, edema, orthopnea, palpitations, paroxysmal nocturnal dyspnea or shortness of breath Dermatological: negative for rash Respiratory: negative for cough or wheezing Urologic: negative for hematuria Abdominal: negative for nausea, vomiting, diarrhea, bright red blood per rectum, melena, or hematemesis Neurologic: negative for visual changes, syncope,  or dizziness All other systems reviewed and are otherwise negative except as noted above.    Blood pressure (!) 142/88, pulse 68, height 5' 4"  (1.626 m), weight 172 lb (78 kg).  General appearance: alert and no distress Neck: no adenopathy, no carotid bruit, no JVD, supple, symmetrical, trachea midline and thyroid not enlarged, symmetric, no tenderness/mass/nodules Lungs: clear to auscultation bilaterally Heart:  regular rate and rhythm, S1, S2 normal, no murmur, click, rub or gallop Extremities: extremities normal, atraumatic, no cyanosis or edema  EKG atrial fibrillation with rapid ventricular response of 68 and nonspecific ST and T-wave changes. First the reviewed this EKG  ASSESSMENT AND PLAN:   Persistent atrial fibrillation History of persistent atrial fibrillation rate controlled on beta blocker and anticoagulated on Eliquis . She is asymptomatic  HTN (hypertension), malignant History of hypertensive blood pressure measures 142/88. She is on amlodipine and metoprolol as well as ramipril. Continue current meds at current dosing      Lorretta Harp MD [facility], [facility] 10/16/2016 9:38 AM

## 2016-11-04 ENCOUNTER — Other Ambulatory Visit: Payer: Self-pay | Admitting: Cardiovascular Disease

## 2016-11-04 NOTE — Telephone Encounter (Signed)
REFILL 

## 2016-11-20 ENCOUNTER — Other Ambulatory Visit: Payer: Self-pay | Admitting: Cardiovascular Disease

## 2016-12-15 ENCOUNTER — Other Ambulatory Visit: Payer: Self-pay | Admitting: Cardiovascular Disease

## 2016-12-30 DIAGNOSIS — Z6829 Body mass index (BMI) 29.0-29.9, adult: Secondary | ICD-10-CM | POA: Diagnosis not present

## 2016-12-30 DIAGNOSIS — E663 Overweight: Secondary | ICD-10-CM | POA: Diagnosis not present

## 2016-12-30 DIAGNOSIS — Z1589 Genetic susceptibility to other disease: Secondary | ICD-10-CM | POA: Diagnosis not present

## 2016-12-30 DIAGNOSIS — M0579 Rheumatoid arthritis with rheumatoid factor of multiple sites without organ or systems involvement: Secondary | ICD-10-CM | POA: Diagnosis not present

## 2016-12-30 DIAGNOSIS — Z79899 Other long term (current) drug therapy: Secondary | ICD-10-CM | POA: Diagnosis not present

## 2016-12-30 DIAGNOSIS — M2550 Pain in unspecified joint: Secondary | ICD-10-CM | POA: Diagnosis not present

## 2017-03-08 ENCOUNTER — Other Ambulatory Visit: Payer: Self-pay | Admitting: Family Medicine

## 2017-04-01 DIAGNOSIS — Z79899 Other long term (current) drug therapy: Secondary | ICD-10-CM | POA: Diagnosis not present

## 2017-04-01 DIAGNOSIS — M2550 Pain in unspecified joint: Secondary | ICD-10-CM | POA: Diagnosis not present

## 2017-04-01 DIAGNOSIS — M0579 Rheumatoid arthritis with rheumatoid factor of multiple sites without organ or systems involvement: Secondary | ICD-10-CM | POA: Diagnosis not present

## 2017-04-01 DIAGNOSIS — Z1589 Genetic susceptibility to other disease: Secondary | ICD-10-CM | POA: Diagnosis not present

## 2017-04-01 DIAGNOSIS — Z6829 Body mass index (BMI) 29.0-29.9, adult: Secondary | ICD-10-CM | POA: Diagnosis not present

## 2017-04-01 DIAGNOSIS — E663 Overweight: Secondary | ICD-10-CM | POA: Diagnosis not present

## 2017-04-07 ENCOUNTER — Other Ambulatory Visit: Payer: Self-pay | Admitting: Cardiovascular Disease

## 2017-04-25 ENCOUNTER — Other Ambulatory Visit: Payer: Self-pay | Admitting: Family Medicine

## 2017-04-25 DIAGNOSIS — E039 Hypothyroidism, unspecified: Secondary | ICD-10-CM

## 2017-04-25 DIAGNOSIS — M0579 Rheumatoid arthritis with rheumatoid factor of multiple sites without organ or systems involvement: Secondary | ICD-10-CM

## 2017-04-25 DIAGNOSIS — E559 Vitamin D deficiency, unspecified: Secondary | ICD-10-CM

## 2017-04-25 DIAGNOSIS — I4819 Other persistent atrial fibrillation: Secondary | ICD-10-CM

## 2017-04-29 ENCOUNTER — Other Ambulatory Visit (INDEPENDENT_AMBULATORY_CARE_PROVIDER_SITE_OTHER): Payer: Medicare HMO

## 2017-04-29 DIAGNOSIS — E039 Hypothyroidism, unspecified: Secondary | ICD-10-CM | POA: Diagnosis not present

## 2017-04-29 DIAGNOSIS — E559 Vitamin D deficiency, unspecified: Secondary | ICD-10-CM | POA: Diagnosis not present

## 2017-04-29 DIAGNOSIS — I481 Persistent atrial fibrillation: Secondary | ICD-10-CM | POA: Diagnosis not present

## 2017-04-29 DIAGNOSIS — M0579 Rheumatoid arthritis with rheumatoid factor of multiple sites without organ or systems involvement: Secondary | ICD-10-CM | POA: Diagnosis not present

## 2017-04-29 DIAGNOSIS — I4819 Other persistent atrial fibrillation: Secondary | ICD-10-CM

## 2017-04-29 LAB — COMPREHENSIVE METABOLIC PANEL
ALT: 14 U/L (ref 0–35)
AST: 14 U/L (ref 0–37)
Albumin: 3.8 g/dL (ref 3.5–5.2)
Alkaline Phosphatase: 112 U/L (ref 39–117)
BILIRUBIN TOTAL: 0.7 mg/dL (ref 0.2–1.2)
BUN: 16 mg/dL (ref 6–23)
CO2: 30 meq/L (ref 19–32)
Calcium: 9.6 mg/dL (ref 8.4–10.5)
Chloride: 104 mEq/L (ref 96–112)
Creatinine, Ser: 0.81 mg/dL (ref 0.40–1.20)
GFR: 89.92 mL/min (ref >60.00)
GLUCOSE: 108 mg/dL — AB (ref 70–99)
Potassium: 4.3 mEq/L (ref 3.5–5.1)
Sodium: 139 mEq/L (ref 135–145)
Total Protein: 7.6 g/dL (ref 6.0–8.3)

## 2017-04-29 LAB — CBC WITH DIFFERENTIAL/PLATELET
BASOS ABS: 0 10*3/uL (ref 0.0–0.1)
Basophils Relative: 0.9 % (ref 0.0–3.0)
EOS ABS: 0.1 10*3/uL (ref 0.0–0.7)
Eosinophils Relative: 1.8 % (ref 0.0–5.0)
HCT: 34.2 % — ABNORMAL LOW (ref 36.0–46.0)
Hemoglobin: 10.7 g/dL — ABNORMAL LOW (ref 12.0–15.0)
LYMPHS ABS: 1 10*3/uL (ref 0.7–4.0)
Lymphocytes Relative: 17.5 % (ref 12.0–46.0)
MCHC: 31.3 g/dL (ref 30.0–36.0)
MCV: 79.8 fl (ref 78.0–100.0)
MONO ABS: 0.6 10*3/uL (ref 0.1–1.0)
Monocytes Relative: 10.1 % (ref 3.0–12.0)
NEUTROS ABS: 3.9 10*3/uL (ref 1.4–7.7)
NEUTROS PCT: 69.7 % (ref 43.0–77.0)
PLATELETS: 200 10*3/uL (ref 150.0–400.0)
RBC: 4.29 Mil/uL (ref 3.87–5.11)
RDW: 16.6 % — ABNORMAL HIGH (ref 11.5–15.5)
WBC: 5.5 10*3/uL (ref 4.0–10.5)

## 2017-04-29 LAB — TSH: TSH: 0.31 u[IU]/mL — ABNORMAL LOW (ref 0.35–4.50)

## 2017-04-29 LAB — VITAMIN D 25 HYDROXY (VIT D DEFICIENCY, FRACTURES): VITD: 32.02 ng/mL (ref 30.00–100.00)

## 2017-04-30 ENCOUNTER — Other Ambulatory Visit: Payer: Self-pay | Admitting: Family Medicine

## 2017-04-30 MED ORDER — LEVOTHYROXINE SODIUM 137 MCG PO TABS: 137.0000 ug | ORAL_TABLET | Freq: Every day | ORAL | 3 refills | Status: DC

## 2017-05-03 ENCOUNTER — Encounter: Payer: Commercial Managed Care - HMO | Admitting: Family Medicine

## 2017-05-11 ENCOUNTER — Other Ambulatory Visit: Payer: Self-pay | Admitting: Family Medicine

## 2017-05-11 ENCOUNTER — Other Ambulatory Visit: Payer: Self-pay | Admitting: Cardiovascular Disease

## 2017-05-20 ENCOUNTER — Ambulatory Visit (INDEPENDENT_AMBULATORY_CARE_PROVIDER_SITE_OTHER): Payer: Medicare HMO | Admitting: Family Medicine

## 2017-05-20 ENCOUNTER — Encounter: Payer: Self-pay | Admitting: Family Medicine

## 2017-05-20 VITALS — BP 126/74 | HR 71 | Temp 98.0°F | Ht 63.0 in | Wt 170.0 lb

## 2017-05-20 DIAGNOSIS — I4819 Other persistent atrial fibrillation: Secondary | ICD-10-CM

## 2017-05-20 DIAGNOSIS — Z7189 Other specified counseling: Secondary | ICD-10-CM

## 2017-05-20 DIAGNOSIS — E039 Hypothyroidism, unspecified: Secondary | ICD-10-CM

## 2017-05-20 DIAGNOSIS — Z0000 Encounter for general adult medical examination without abnormal findings: Secondary | ICD-10-CM | POA: Diagnosis not present

## 2017-05-20 DIAGNOSIS — M8580 Other specified disorders of bone density and structure, unspecified site: Secondary | ICD-10-CM

## 2017-05-20 DIAGNOSIS — I10 Essential (primary) hypertension: Secondary | ICD-10-CM

## 2017-05-20 DIAGNOSIS — D509 Iron deficiency anemia, unspecified: Secondary | ICD-10-CM

## 2017-05-20 DIAGNOSIS — M0579 Rheumatoid arthritis with rheumatoid factor of multiple sites without organ or systems involvement: Secondary | ICD-10-CM

## 2017-05-20 DIAGNOSIS — I340 Nonrheumatic mitral (valve) insufficiency: Secondary | ICD-10-CM | POA: Insufficient documentation

## 2017-05-20 MED ORDER — DICLOFENAC SODIUM 1 % TD GEL: 1.0000 "application " | Freq: Three times a day (TID) | TRANSDERMAL | 3 refills | Status: DC

## 2017-05-20 NOTE — Assessment & Plan Note (Signed)
Reviewed latest dexa, encouraged calcium, vit D and regular weight bearing exercise.

## 2017-05-20 NOTE — Assessment & Plan Note (Addendum)
Worsening. Update iron panel next labwork. Thought IDA. ?hemolysis from MR. Colonoscopy UTD.

## 2017-05-20 NOTE — Assessment & Plan Note (Signed)
Chronic, stable. Continue current regimen. 

## 2017-05-20 NOTE — Assessment & Plan Note (Signed)
Chronic. Low TSH - we decreased levothyroxine to 154mcg daily. I asked her to return in 1 mo for repeat testing.

## 2017-05-20 NOTE — Assessment & Plan Note (Signed)
Advanced directives: HCPOA would be daughter.Packet at home, encouraged she work on this. 

## 2017-05-20 NOTE — Progress Notes (Signed)
BP 126/74 (BP Location: Left Arm, Patient Position: Sitting, Cuff Size: Normal)   Pulse 71   Temp 98 F (36.7 C) (Oral)   Ht 5\' 3"  (1.6 m)   Wt 170 lb (77.1 kg)   SpO2 98%   BMI 30.11 kg/m    CC: medicare wellness visit Subjective:    Patient ID: Joanna Hood, female    DOB: [birth year], 71 y.o.   MRN: [id number]  HPI: Drinda Belgard is a 71 y.o. female presenting on 05/20/2017 for Medicare Wellness   Passes hearing and vision screens Denies depression or falls  Preventative: COLONOSCOPY 11/2015 - TA, int hem, rpt 5 yrs Henrene Pastor).  Well woman exam - partial hysterectomy 1970, ovaries remain.All normal pap smears. Previously discussed intermittent pelvic exams. Pt declined.  Mammogram 10/2014 Birads 1.  DEXA 12/2013 - T-2.3 osteopenia - pt gets calcium/vitamin D daily and walks regularly.  Flu shot yearly Pneumovax 04/2013, prevnar 2016 Tdap 2017 shingrix - not interested  Advanced directives: HCPOA would be daughter.Packet at home, encouraged she work on this.  Seat belt use discussed Sunscreen use discussed. 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 Interim medical history since our last visit reviewed. Allergies and medications reviewed and updated. Outpatient Medications Prior to Visit  Medication Sig Dispense Refill  . amLODipine (NORVASC) 10 MG tablet TAKE 1 TABLET EVERY DAY 90 tablet 0  . Calcium Carb-Cholecalciferol (CALCIUM-VITAMIN D) 600-400 MG-UNIT TABS Take 1 tablet by mouth daily.    . cholecalciferol (VITAMIN D) 1000 UNITS tablet Take 1,000 Units by mouth daily.    Marland Kitchen ELIQUIS 5 MG TABS tablet TAKE 1 TABLET TWICE DAILY 180  tablet 0  . furosemide (LASIX) 20 MG tablet TAKE 1 TABLET EVERY DAY 90 tablet 2  . levothyroxine (SYNTHROID, LEVOTHROID) 137 MCG tablet Take 1 tablet (137 mcg total) daily before breakfast by mouth. 30 tablet 3  . methotrexate (RHEUMATREX) 2.5 MG tablet Take 15 mg by mouth once a week. Caution:Chemotherapy. Protect from light.    . metoprolol tartrate (LOPRESSOR) 25 MG tablet Take 1 tablet (25 mg total) by mouth 2 (two) times daily. NEED OV. 60 tablet 11  . pantoprazole (PROTONIX) 40 MG tablet Take 1 tablet (40 mg total) by mouth every Monday, Wednesday, and Friday.    . potassium chloride SA (K-DUR,KLOR-CON) 20 MEQ tablet TAKE 1 TABLET EVERY DAY 90 tablet 3  . ramipril (ALTACE) 10 MG capsule TAKE 1 CAPSULE TWICE DAILY 180 capsule 3  . diclofenac sodium (VOLTAREN) 1 % GEL Apply 1 application topically 3 (three) times daily. 1 Tube 1   No facility-administered medications prior to visit.      Per HPI unless specifically indicated in ROS section below Review of Systems  Constitutional: Negative for activity change, appetite change, chills, fatigue, fever and unexpected weight change.  HENT: Negative for hearing loss.   Eyes: Negative for visual disturbance.  Respiratory: Negative for cough, chest tightness, shortness of breath and wheezing.   Cardiovascular: Negative for chest pain, palpitations and leg swelling.  Gastrointestinal: Negative for abdominal distention, abdominal pain, blood in stool, constipation, diarrhea, nausea and vomiting.  Genitourinary: Negative for difficulty urinating and hematuria.  Musculoskeletal: Negative for arthralgias, myalgias and neck pain.  Skin: Negative for rash.  Neurological: Negative for dizziness, seizures, syncope and headaches.  Hematological: Negative for adenopathy. Does not bruise/bleed easily.  Psychiatric/Behavioral: Negative for dysphoric mood. The patient is not nervous/anxious.        Objective:    BP 126/74 (BP Location: Left Arm,  Patient Position: Sitting, Cuff Size: Normal)   Pulse 71   Temp 98 F (36.7 C) (Oral)   Ht 5\' 3"  (1.6 m)   Wt 170 lb (77.1 kg)   SpO2 98%   BMI 30.11 kg/m   Wt Readings from Last 3 Encounters:  05/20/17 170 lb (77.1 kg)  10/16/16 172 lb (78 kg)  04/29/16 174 lb (78.9 kg)    Physical Exam  Constitutional: She is oriented to person, place, and time. She appears well-developed and well-nourished. No distress.  HENT:  Head: Normocephalic and atraumatic.  Right Ear: Hearing, tympanic membrane, external ear and ear canal normal.  Left Ear: Hearing, tympanic membrane, external ear and ear canal normal.  Nose: Nose normal.  Mouth/Throat: Uvula is midline, oropharynx is clear and moist and mucous membranes are normal. No oropharyngeal exudate, posterior oropharyngeal edema or posterior oropharyngeal erythema.  Eyes: Conjunctivae and EOM are normal. Pupils are equal, round, and reactive to light. No scleral icterus.  Neck: Normal range of motion. Neck supple. Carotid bruit is present (L bruit anticipate referred from murmur). No thyromegaly present.  Cardiovascular: Normal rate, regular rhythm and intact distal pulses.  Murmur (mid systolic 4/6 murmur) heard. Pulses:      Radial pulses are 2+ on the right side, and 2+ on the left side.  Pulmonary/Chest: Effort normal and breath sounds normal. No respiratory distress. She has no wheezes. She has no rales.  Abdominal: Soft. Bowel sounds are normal. She exhibits no distension and no mass. There is no tenderness. There is no rebound and no guarding.  Musculoskeletal: Normal range of motion. She exhibits no edema.  Lymphadenopathy:    She has no cervical adenopathy.  Neurological: She is alert and oriented to person, place, and time.  CN grossly intact, station and gait intact Recall 3/3  Skin: Skin is warm and dry. No rash noted.  Psychiatric: She has a normal mood and affect. Her behavior is normal. Judgment and thought content normal.    Nursing note and vitals reviewed.  Results for orders placed or performed in visit on 04/29/17  CBC with Differential/Platelet  Result Value Ref Range   WBC 5.5 4.0 - 10.5 K/uL   RBC 4.29 3.87 - 5.11 Mil/uL   Hemoglobin 10.7 (L) 12.0 - 15.0 g/dL   HCT 34.2 (L) 36.0 - 46.0 %   MCV 79.8 78.0 - 100.0 fl   MCHC 31.3 30.0 - 36.0 g/dL   RDW 16.6 (H) 11.5 - 15.5 %   Platelets 200.0 150.0 - 400.0 K/uL   Neutrophils Relative % 69.7 43.0 - 77.0 %   Lymphocytes Relative 17.5 12.0 - 46.0 %   Monocytes Relative 10.1 3.0 - 12.0 %   Eosinophils Relative 1.8 0.0 - 5.0 %   Basophils Relative 0.9 0.0 - 3.0 %   Neutro Abs 3.9 1.4 - 7.7 K/uL   Lymphs Abs 1.0 0.7 - 4.0 K/uL   Monocytes Absolute 0.6 0.1 - 1.0 K/uL   Eosinophils Absolute 0.1 0.0 - 0.7 K/uL   Basophils Absolute 0.0 0.0 - 0.1 K/uL  Comprehensive metabolic panel  Result Value Ref Range   Sodium 139 135 - 145 mEq/L   Potassium 4.3 3.5 - 5.1  mEq/L   Chloride 104 96 - 112 mEq/L   CO2 30 19 - 32 mEq/L   Glucose, Bld 108 (H) 70 - 99 mg/dL   BUN 16 6 - 23 mg/dL   Creatinine, Ser 0.81 0.40 - 1.20 mg/dL   Total Bilirubin 0.7 0.2 - 1.2 mg/dL   Alkaline Phosphatase 112 39 - 117 U/L   AST 14 0 - 37 U/L   ALT 14 0 - 35 U/L   Total Protein 7.6 6.0 - 8.3 g/dL   Albumin 3.8 3.5 - 5.2 g/dL   Calcium 9.6 8.4 - 10.5 mg/dL   GFR 89.92 >60.00 mL/min  TSH  Result Value Ref Range   TSH 0.31 (L) 0.35 - 4.50 uIU/mL  VITAMIN D 25 Hydroxy (Vit-D Deficiency, Fractures)  Result Value Ref Range   VITD 32.02 30.00 - 100.00 ng/mL      Assessment & Plan:   Problem List Items Addressed This Visit    Advanced care planning/counseling discussion    Advanced directives: HCPOA would be daughter.Packet at home, encouraged she work on this.       Health maintenance examination    Preventative protocols reviewed and updated unless pt declined. Discussed healthy diet and lifestyle.       HTN (hypertension), malignant    Chronic, stable. Continue  current regimen.       Hypothyroidism    Chronic. Low TSH - we decreased levothyroxine to 162mcg daily. I asked her to return in 1 mo for repeat testing.       Relevant Orders   TSH   T4, free   Medicare annual wellness visit, subsequent - Primary    I have personally reviewed the Medicare Annual Wellness questionnaire and have noted 1. The patient's medical and social history 2. Their use of alcohol, tobacco or illicit drugs 3. Their current medications and supplements 4. The patient's functional ability including ADL's, fall risks, home safety risks and hearing or visual impairment. Cognitive function has been assessed and addressed as indicated.  5. Diet and physical activity 6. Evidence for depression or mood disorders The patients weight, height, BMI have been recorded in the chart. I have made referrals, counseling and provided education to the patient based on review of the above and I have provided the pt with a written personalized care plan for preventive services. Provider list updated.. See scanned questionairre as needed for further documentation. Reviewed preventative protocols and updated unless pt declined.       Microcytic anemia    Worsening. Update iron panel next labwork. Thought IDA. ?hemolysis from MR. Colonoscopy UTD.       Relevant Orders   CBC with Differential/Platelet   Ferritin   IBC panel   Pathologist smear review   Lactate Dehydrogenase   Mitral valve regurgitation    Reviewed latest echo 2016 showing worsening moderate MR as well as TR. She also had dilated LA in h/o afib. Denies dyspnea, chest pain or dizziness. Noted worsening anemia. Given overall asxs, will hold off on echo today. Check labwork in 1 month, if worsening low threshold to update echo.       Osteopenia    Reviewed latest dexa, encouraged calcium, vit D and regular weight bearing exercise.       Persistent atrial fibrillation (HCC)    Persistent afib followed by cardiology  Gwenlyn Found). On eliquis.       Rheumatoid arthritis ([place])    On MTX followed by rheum.  Follow up plan: Return in about 6 months (around 11/17/2017) for annual exam, prior fasting for blood work, medicare wellness visit.  Ria Bush, MD

## 2017-05-20 NOTE — Patient Instructions (Addendum)
Call to schedule mammogram.  Work on advanced directive packet.  Continue new thyroid dose, return in 1 month for labs to recheck levels.  Anemia was slightly worse - we will recheck iron panel when you return for labs.  You did have leaky mitral valve on last ultrasound - we may repeat.  Good to see you today, call us with questions.  Return as needed or in 1 year for next medicare wellness visit with Katha Cabal and physical with me.  Health Maintenance, Female Adopting a healthy lifestyle and getting preventive care can go a long way to promote health and wellness. Talk with your health care provider about what schedule of regular examinations is right for you. This is a good chance for you to check in with your provider about disease prevention and staying healthy. In between checkups, there are plenty of things you can do on your own. Experts have done a lot of research about which lifestyle changes and preventive measures are most likely to keep you healthy. Ask your health care provider for more information. Weight and diet Eat a healthy diet  Be sure to include plenty of vegetables, fruits, low-fat dairy products, and lean protein.  Do not eat a lot of foods high in solid fats, added sugars, or salt.  Get regular exercise. This is one of the most important things you can do for your health. ? Most adults should exercise for at least 150 minutes each week. The exercise should increase your heart rate and make you sweat (moderate-intensity exercise). ? Most adults should also do strengthening exercises at least twice a week. This is in addition to the moderate-intensity exercise.  Maintain a healthy weight  Body mass index (BMI) is a measurement that can be used to identify possible weight problems. It estimates body fat based on height and weight. Your health care provider can help determine your BMI and help you achieve or maintain a healthy weight.  For females 98 years of age and  older: ? A BMI below 18.5 is considered underweight. ? A BMI of 18.5 to 24.9 is normal. ? A BMI of 25 to 29.9 is considered overweight. ? A BMI of 30 and above is considered obese.  Watch levels of cholesterol and blood lipids  You should start having your blood tested for lipids and cholesterol at 70 years of age, then have this test every 5 years.  You may need to have your cholesterol levels checked more often if: ? Your lipid or cholesterol levels are high. ? You are older than 70 years of age. ? You are at high risk for heart disease.  Cancer screening Lung Cancer  Lung cancer screening is recommended for adults 24-58 years old who are at high risk for lung cancer because of a history of smoking.  A yearly low-dose CT scan of the lungs is recommended for people who: ? Currently smoke. ? Have quit within the past 15 years. ? Have at least a 30-pack-year history of smoking. A pack year is smoking an average of one pack of cigarettes a day for 1 year.  Yearly screening should continue until it has been 15 years since you quit.  Yearly screening should stop if you develop a health problem that would prevent you from having lung cancer treatment.  Breast Cancer  Practice breast self-awareness. This means understanding how your breasts normally appear and feel.  It also means doing regular breast self-exams. Let your health care provider know about any  changes, no matter how small.  If you are in your 20s or 30s, you should have a clinical breast exam (CBE) by a health care provider every 1-3 years as part of a regular health exam.  If you are 63 or older, have a CBE every year. Also consider having a breast X-ray (mammogram) every year.  If you have a family history of breast cancer, talk to your health care provider about genetic screening.  If you are at high risk for breast cancer, talk to your health care provider about having an MRI and a mammogram every year.  Breast  cancer gene (BRCA) assessment is recommended for women who have family members with BRCA-related cancers. BRCA-related cancers include: ? Breast. ? Ovarian. ? Tubal. ? Peritoneal cancers.  Results of the assessment will determine the need for genetic counseling and BRCA1 and BRCA2 testing.  Cervical Cancer Your health care provider may recommend that you be screened regularly for cancer of the pelvic organs (ovaries, uterus, and vagina). This screening involves a pelvic examination, including checking for microscopic changes to the surface of your cervix (Pap test). You may be encouraged to have this screening done every 3 years, beginning at age 44.  For women ages 52-65, health care providers may recommend pelvic exams and Pap testing every 3 years, or they may recommend the Pap and pelvic exam, combined with testing for human papilloma virus (HPV), every 5 years. Some types of HPV increase your risk of cervical cancer. Testing for HPV may also be done on women of any age with unclear Pap test results.  Other health care providers may not recommend any screening for nonpregnant women who are considered low risk for pelvic cancer and who do not have symptoms. Ask your health care provider if a screening pelvic exam is right for you.  If you have had past treatment for cervical cancer or a condition that could lead to cancer, you need Pap tests and screening for cancer for at least 20 years after your treatment. If Pap tests have been discontinued, your risk factors (such as having a new sexual partner) need to be reassessed to determine if screening should resume. Some women have medical problems that increase the chance of getting cervical cancer. In these cases, your health care provider may recommend more frequent screening and Pap tests.  Colorectal Cancer  This type of cancer can be detected and often prevented.  Routine colorectal cancer screening usually begins at 70 years of age and  continues through 70 years of age.  Your health care provider may recommend screening at an earlier age if you have risk factors for colon cancer.  Your health care provider may also recommend using home test kits to check for hidden blood in the stool.  A small camera at the end of a tube can be used to examine your colon directly (sigmoidoscopy or colonoscopy). This is done to check for the earliest forms of colorectal cancer.  Routine screening usually begins at age 56.  Direct examination of the colon should be repeated every 5-10 years through 70 years of age. However, you may need to be screened more often if early forms of precancerous polyps or small growths are found.  Skin Cancer  Check your skin from head to toe regularly.  Tell your health care provider about any new moles or changes in moles, especially if there is a change in a mole's shape or color.  Also tell your health care provider if  you have a mole that is larger than the size of a pencil eraser.  Always use sunscreen. Apply sunscreen liberally and repeatedly throughout the day.  Protect yourself by wearing long sleeves, pants, a wide-brimmed hat, and sunglasses whenever you are outside.  Heart disease, diabetes, and high blood pressure  High blood pressure causes heart disease and increases the risk of stroke. High blood pressure is more likely to develop in: ? People who have blood pressure in the high end of the normal range (130-139/85-89 mm Hg). ? People who are overweight or obese. ? People who are African American.  If you are 43-66 years of age, have your blood pressure checked every 3-5 years. If you are 22 years of age or older, have your blood pressure checked every year. You should have your blood pressure measured twice-once when you are at a hospital or clinic, and once when you are not at a hospital or clinic. Record the average of the two measurements. To check your blood pressure when you are not  at a hospital or clinic, you can use: ? An automated blood pressure machine at a pharmacy. ? A home blood pressure monitor.  If you are between 78 years and 56 years old, ask your health care provider if you should take aspirin to prevent strokes.  Have regular diabetes screenings. This involves taking a blood sample to check your fasting blood sugar level. ? If you are at a normal weight and have a low risk for diabetes, have this test once every three years after 69 years of age. ? If you are overweight and have a high risk for diabetes, consider being tested at a younger age or more often. Preventing infection Hepatitis B  If you have a higher risk for hepatitis B, you should be screened for this virus. You are considered at high risk for hepatitis B if: ? You were born in a country where hepatitis B is common. Ask your health care provider which countries are considered high risk. ? Your parents were born in a high-risk country, and you have not been immunized against hepatitis B (hepatitis B vaccine). ? You have HIV or AIDS. ? You use needles to inject street drugs. ? You live with someone who has hepatitis B. ? You have had sex with someone who has hepatitis B. ? You get hemodialysis treatment. ? You take certain medicines for conditions, including cancer, organ transplantation, and autoimmune conditions.  Hepatitis C  Blood testing is recommended for: ? Everyone born from [birth year] through 1964. ? Anyone with known risk factors for hepatitis C.  Sexually transmitted infections (STIs)  You should be screened for sexually transmitted infections (STIs) including gonorrhea and chlamydia if: ? You are sexually active and are younger than 70 years of age. ? You are older than 70 years of age and your health care provider tells you that you are at risk for this type of infection. ? Your sexual activity has changed since you were last screened and you are at an increased risk for chlamydia  or gonorrhea. Ask your health care provider if you are at risk.  If you do not have HIV, but are at risk, it may be recommended that you take a prescription medicine daily to prevent HIV infection. This is called pre-exposure prophylaxis (PrEP). You are considered at risk if: ? You are sexually active and do not regularly use condoms or know the HIV status of your partner(s). ? You take drugs by  injection. ? You are sexually active with a partner who has HIV.  Talk with your health care provider about whether you are at high risk of being infected with HIV. If you choose to begin PrEP, you should first be tested for HIV. You should then be tested every 3 months for as long as you are taking PrEP. Pregnancy  If you are premenopausal and you may become pregnant, ask your health care provider about preconception counseling.  If you may become pregnant, take 400 to 800 micrograms (mcg) of folic acid every day.  If you want to prevent pregnancy, talk to your health care provider about birth control (contraception). Osteoporosis and menopause  Osteoporosis is a disease in which the bones lose minerals and strength with aging. This can result in serious bone fractures. Your risk for osteoporosis can be identified using a bone density scan.  If you are 12 years of age or older, or if you are at risk for osteoporosis and fractures, ask your health care provider if you should be screened.  Ask your health care provider whether you should take a calcium or vitamin D supplement to lower your risk for osteoporosis.  Menopause may have certain physical symptoms and risks.  Hormone replacement therapy may reduce some of these symptoms and risks. Talk to your health care provider about whether hormone replacement therapy is right for you. Follow these instructions at home:  Schedule regular health, dental, and eye exams.  Stay current with your immunizations.  Do not use any tobacco products  including cigarettes, chewing tobacco, or electronic cigarettes.  If you are pregnant, do not drink alcohol.  If you are breastfeeding, limit how much and how often you drink alcohol.  Limit alcohol intake to no more than 1 drink per day for nonpregnant women. One drink equals 12 ounces of beer, 5 ounces of wine, or 1 ounces of hard liquor.  Do not use street drugs.  Do not share needles.  Ask your health care provider for help if you need support or information about quitting drugs.  Tell your health care provider if you often feel depressed.  Tell your health care provider if you have ever been abused or do not feel safe at home. This information is not intended to replace advice given to you by your health care provider. Make sure you discuss any questions you have with your health care provider. Document Released: 12/22/2010 Document Revised: 11/14/2015 Document Reviewed: 03/12/2015 Elsevier Interactive Patient Education  [employer].

## 2017-05-20 NOTE — Assessment & Plan Note (Signed)
Reviewed latest echo 2016 showing worsening moderate MR as well as TR. She also had dilated LA in h/o afib. Denies dyspnea, chest pain or dizziness. Noted worsening anemia. Given overall asxs, will hold off on echo today. Check labwork in 1 month, if worsening low threshold to update echo.

## 2017-05-20 NOTE — Assessment & Plan Note (Signed)
Preventative protocols reviewed and updated unless pt declined. Discussed healthy diet and lifestyle.  

## 2017-05-20 NOTE — Assessment & Plan Note (Signed)

## 2017-05-20 NOTE — Assessment & Plan Note (Signed)
Persistent afib followed by cardiology Gwenlyn Found). On eliquis.

## 2017-05-20 NOTE — Assessment & Plan Note (Signed)
On MTX followed by rheum.

## 2017-06-24 ENCOUNTER — Other Ambulatory Visit (INDEPENDENT_AMBULATORY_CARE_PROVIDER_SITE_OTHER): Payer: Medicare HMO

## 2017-06-24 DIAGNOSIS — E039 Hypothyroidism, unspecified: Secondary | ICD-10-CM

## 2017-06-24 DIAGNOSIS — D509 Iron deficiency anemia, unspecified: Secondary | ICD-10-CM | POA: Diagnosis not present

## 2017-06-24 LAB — CBC WITH DIFFERENTIAL/PLATELET
BASOS ABS: 0.1 10*3/uL (ref 0.0–0.1)
Basophils Relative: 1.3 % (ref 0.0–3.0)
EOS ABS: 0.1 10*3/uL (ref 0.0–0.7)
Eosinophils Relative: 2.2 % (ref 0.0–5.0)
HEMATOCRIT: 36.6 % (ref 36.0–46.0)
HEMOGLOBIN: 11.5 g/dL — AB (ref 12.0–15.0)
LYMPHS PCT: 23.4 % (ref 12.0–46.0)
Lymphs Abs: 1.2 10*3/uL (ref 0.7–4.0)
MCHC: 31.5 g/dL (ref 30.0–36.0)
MCV: 79.3 fl (ref 78.0–100.0)
Monocytes Absolute: 0.6 10*3/uL (ref 0.1–1.0)
Monocytes Relative: 12.3 % — ABNORMAL HIGH (ref 3.0–12.0)
Neutro Abs: 3.2 10*3/uL (ref 1.4–7.7)
Neutrophils Relative %: 60.8 % (ref 43.0–77.0)
PLATELETS: 208 10*3/uL (ref 150.0–400.0)
RBC: 4.61 Mil/uL (ref 3.87–5.11)
RDW: 15.6 % — ABNORMAL HIGH (ref 11.5–15.5)
WBC: 5.2 10*3/uL (ref 4.0–10.5)

## 2017-06-24 LAB — IBC PANEL
Iron: 80 ug/dL (ref 42–145)
SATURATION RATIOS: 21.3 % (ref 20.0–50.0)
TRANSFERRIN: 268 mg/dL (ref 212.0–360.0)

## 2017-06-24 LAB — T4, FREE: Free T4: 1.2 ng/dL (ref 0.60–1.60)

## 2017-06-24 LAB — TSH: TSH: 0.4 u[IU]/mL (ref 0.35–4.50)

## 2017-06-24 LAB — FERRITIN: FERRITIN: 177.2 ng/mL (ref 10.0–291.0)

## 2017-06-25 LAB — PATHOLOGIST SMEAR REVIEW

## 2017-06-25 LAB — LACTATE DEHYDROGENASE: LDH: 187 U/L (ref 120–250)

## 2017-06-28 ENCOUNTER — Other Ambulatory Visit: Payer: Self-pay

## 2017-06-28 NOTE — Telephone Encounter (Signed)
Pt called requesting rx for levothyroxine be sent to [facility].

## 2017-06-29 MED ORDER — LEVOTHYROXINE SODIUM 137 MCG PO TABS: 137.0000 ug | ORAL_TABLET | Freq: Every day | ORAL | 1 refills | Status: DC

## 2017-07-06 ENCOUNTER — Other Ambulatory Visit: Payer: Self-pay | Admitting: Cardiovascular Disease

## 2017-07-06 DIAGNOSIS — E663 Overweight: Secondary | ICD-10-CM | POA: Diagnosis not present

## 2017-07-06 DIAGNOSIS — M2550 Pain in unspecified joint: Secondary | ICD-10-CM | POA: Diagnosis not present

## 2017-07-06 DIAGNOSIS — Z6829 Body mass index (BMI) 29.0-29.9, adult: Secondary | ICD-10-CM | POA: Diagnosis not present

## 2017-07-06 DIAGNOSIS — Z1589 Genetic susceptibility to other disease: Secondary | ICD-10-CM | POA: Diagnosis not present

## 2017-07-06 DIAGNOSIS — Z79899 Other long term (current) drug therapy: Secondary | ICD-10-CM | POA: Diagnosis not present

## 2017-07-06 DIAGNOSIS — M0579 Rheumatoid arthritis with rheumatoid factor of multiple sites without organ or systems involvement: Secondary | ICD-10-CM | POA: Diagnosis not present

## 2017-07-13 ENCOUNTER — Other Ambulatory Visit: Payer: Self-pay | Admitting: Family Medicine

## 2017-08-23 ENCOUNTER — Other Ambulatory Visit: Payer: Self-pay | Admitting: Family Medicine

## 2017-08-23 DIAGNOSIS — Z1231 Encounter for screening mammogram for malignant neoplasm of breast: Secondary | ICD-10-CM

## 2017-09-03 ENCOUNTER — Encounter: Payer: Self-pay | Admitting: Family Medicine

## 2017-09-03 ENCOUNTER — Other Ambulatory Visit: Payer: Self-pay | Admitting: Cardiovascular Disease

## 2017-09-03 ENCOUNTER — Ambulatory Visit
Admission: RE | Admit: 2017-09-03 | Discharge: 2017-09-03 | Disposition: A | Discharge status: Home / Self Care | Payer: Medicare HMO | Source: Ambulatory Visit | Attending: Family Medicine | Admitting: Family Medicine

## 2017-09-03 DIAGNOSIS — Z1231 Encounter for screening mammogram for malignant neoplasm of breast: Secondary | ICD-10-CM | POA: Diagnosis not present

## 2017-09-03 LAB — HM MAMMOGRAPHY

## 2017-09-06 NOTE — Telephone Encounter (Signed)
Rx has been sent to the pharmacy electronically. ° °

## 2017-10-04 ENCOUNTER — Other Ambulatory Visit: Payer: Self-pay | Admitting: Cardiovascular Disease

## 2017-11-08 ENCOUNTER — Other Ambulatory Visit: Payer: Self-pay | Admitting: Cardiovascular Disease

## 2017-11-09 NOTE — Telephone Encounter (Signed)
Rx(s) sent to pharmacy electronically.  

## 2017-11-10 ENCOUNTER — Other Ambulatory Visit: Payer: Self-pay | Admitting: Cardiovascular Disease

## 2017-11-12 DIAGNOSIS — Z1589 Genetic susceptibility to other disease: Secondary | ICD-10-CM | POA: Diagnosis not present

## 2017-11-12 DIAGNOSIS — M2550 Pain in unspecified joint: Secondary | ICD-10-CM | POA: Diagnosis not present

## 2017-11-12 DIAGNOSIS — M0579 Rheumatoid arthritis with rheumatoid factor of multiple sites without organ or systems involvement: Secondary | ICD-10-CM | POA: Diagnosis not present

## 2017-11-12 DIAGNOSIS — E663 Overweight: Secondary | ICD-10-CM | POA: Diagnosis not present

## 2017-11-12 DIAGNOSIS — Z6829 Body mass index (BMI) 29.0-29.9, adult: Secondary | ICD-10-CM | POA: Diagnosis not present

## 2017-11-12 DIAGNOSIS — Z79899 Other long term (current) drug therapy: Secondary | ICD-10-CM | POA: Diagnosis not present

## 2017-11-15 ENCOUNTER — Other Ambulatory Visit: Payer: Self-pay | Admitting: Family Medicine

## 2017-11-19 ENCOUNTER — Encounter: Payer: Self-pay | Admitting: Cardiovascular Disease

## 2017-11-19 ENCOUNTER — Ambulatory Visit: Payer: Medicare HMO | Admitting: Cardiovascular Disease

## 2017-11-19 VITALS — BP 167/99 | HR 61 | Ht 64.0 in | Wt 168.8 lb

## 2017-11-19 DIAGNOSIS — I481 Persistent atrial fibrillation: Secondary | ICD-10-CM | POA: Diagnosis not present

## 2017-11-19 DIAGNOSIS — I10 Essential (primary) hypertension: Secondary | ICD-10-CM | POA: Diagnosis not present

## 2017-11-19 DIAGNOSIS — I4819 Other persistent atrial fibrillation: Secondary | ICD-10-CM

## 2017-11-19 NOTE — Assessment & Plan Note (Signed)
History of chronic atrial fibrillation rate controlled on Eliquis oral anticoagulation. ?

## 2017-11-19 NOTE — Progress Notes (Signed)
11/19/2017 Joanna Hood   1945/11/14  [id number]  Primary Physician Ria Bush, MD Primary Cardiologist: Lorretta Harp MD [name], [place]  HPI:  Joanna Hood is a 72 y.o.  moderately overweight African-American female who I initially met several years ago. I last saw her in the office 10/16/2016. We first met when she was admitted to [facility] with diastolic heart failure, hypertension and atrial fibrillation. She ultimately underwent cardiac catheterization revealing essentially normal coronary arteries and normal LV function by Dr. Ellyn Hack. Her A. Fib converted to sinus rhythm with the aid of IV Cardizem. Her blood pressure was better controlled. She saw Ellen Henri PA-C in the office and again was in atrial fibrillation at which time her beta blocker was adjusted and Eliquis was added because of increased CHA2DSVASC2 score of 2. Since I saw her in the office she remained clinically stable. She denies chest pain or shortness of breath.      Current Meds  Medication Sig  . amLODipine (NORVASC) 10 MG tablet TAKE 1 TABLET EVERY DAY  . Calcium Carb-Cholecalciferol (CALCIUM-VITAMIN D) 600-400 MG-UNIT TABS Take 1 tablet by mouth daily.  . cholecalciferol (VITAMIN D) 1000 UNITS tablet Take 1,000 Units by mouth daily.  . diclofenac sodium (VOLTAREN) 1 % GEL Apply 1 application topically 3 (three) times daily.  Marland Kitchen ELIQUIS 5 MG TABS tablet TAKE 1 TABLET TWICE DAILY  . furosemide (LASIX) 20 MG tablet TAKE 1 TABLET EVERY DAY  . levothyroxine (SYNTHROID, LEVOTHROID) 137 MCG tablet TAKE 1 TABLET (137 MCG TOTAL) BY MOUTH DAILY BEFORE BREAKFAST.  . methotrexate (RHEUMATREX) 2.5 MG tablet Take 15 mg by mouth once a week. Caution:Chemotherapy. Protect from light.  . metoprolol tartrate (LOPRESSOR) 25 MG tablet TAKE 1 TABLET TWICE DAILY (NEED MD APPOINTMENT)  . pantoprazole (PROTONIX) 40 MG tablet Take 1 tablet (40 mg total) by mouth every Monday,  Wednesday, and Friday.  . potassium chloride SA (K-DUR,KLOR-CON) 20 MEQ tablet TAKE 1 TABLET EVERY DAY  . ramipril (ALTACE) 10 MG capsule Take 1 capsule (10 mg total) by mouth 2 (two) times daily. Please schedule appointment for refills     Allergies  Allergen Reactions  . Metronidazole     REACTION: Rash, itching    Social History   Socioeconomic History  . Marital status: Single    Spouse name: Not on file  . Number of children: 1  . Years of education: Not on file  . Highest education level: Not on file  Occupational History  . Occupation: Retired  Scientific laboratory technician  . Financial resource strain: Not on file  . Food insecurity:    Worry: Not on file    Inability: Not on file  . Transportation needs:    Medical: Not on file    Non-medical: Not on file  Tobacco Use  . Smoking status: Former Smoker    Last attempt to quit: 05/16/1969    Years since quitting: 48.5  . Smokeless tobacco: Former Network engineer and Sexual Activity  . Alcohol use: No    Alcohol/week: 0.0 oz  . Drug use: No  . Sexual activity: Never  Lifestyle  . Physical activity:    Days per week: Not on file    Minutes per session: Not on file  . Stress: Not on file  Relationships  . Social connections:    Talks on phone: Not on file    Gets together: Not on file    Attends religious service:  Not on file    Active member of club or organization: Not on file    Attends meetings of clubs or organizations: Not on file    Relationship status: Not on file  . Intimate partner violence:    Fear of current or ex partner: Not on file    Emotionally abused: Not on file    Physically abused: Not on file    Forced sexual activity: Not on file  Other Topics Concern  . 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  Cardiovascular: negative for chest pain, dyspnea on exertion, edema, orthopnea, palpitations, paroxysmal nocturnal dyspnea or shortness of breath Dermatological: negative for rash Respiratory: negative for cough or wheezing Urologic: negative for hematuria Abdominal: negative for nausea, vomiting, diarrhea, bright red blood per rectum, melena, or hematemesis Neurologic: negative for visual changes, syncope, or dizziness All other systems reviewed and are otherwise negative except as noted above.    Blood pressure (!) 167/99, pulse 61, height _0  (1.626 m), weight 168 lb 12.8 oz (76.6 kg).  General appearance: alert and no distress Neck: no adenopathy, no carotid bruit, no JVD, supple, symmetrical, trachea midline and thyroid not enlarged, symmetric, no tenderness/mass/nodules Lungs: clear to auscultation bilaterally Heart: irregularly irregular rhythm Extremities: extremities normal, atraumatic, no cyanosis or edema Pulses: 2+ and symmetric Skin: Skin color, texture, turgor normal. No rashes or lesions Neurologic: Alert and oriented X 3, normal strength and tone. Normal symmetric reflexes. Normal coordination and gait  EKG atrial fibrillation with a ventricular response of 61.  I personally reviewed this EKG.  ASSESSMENT AND PLAN:   Persistent atrial fibrillation History of chronic atrial fibrillation rate controlled on Eliquis oral anticoagulation.  HTN (hypertension), malignant History of essential hypertension blood pressure measured today at 167/99 follow-up blood pressure measured at 148/80.  She is on amlodipine, metoprolol and ramipril.  Continue current meds at current dosing.      Lorretta Harp MD FACP,FACC,FAHA, [facility] 11/19/2017 8:47 AM

## 2017-11-19 NOTE — Patient Instructions (Signed)

## 2017-11-19 NOTE — Assessment & Plan Note (Addendum)
History of essential hypertension blood pressure measured today at 167/99 follow-up blood pressure measured at 148/80.  She is on amlodipine, metoprolol and ramipril.  Continue current meds at current dosing.

## 2017-11-23 NOTE — Addendum Note (Signed)
Addended by: Leland Johns A on: 11/23/2017 01:50 PM   Modules accepted: Orders

## 2017-11-24 ENCOUNTER — Other Ambulatory Visit: Payer: Self-pay | Admitting: Cardiovascular Disease

## 2017-12-09 ENCOUNTER — Other Ambulatory Visit: Payer: Self-pay | Admitting: Cardiovascular Disease

## 2017-12-09 NOTE — Telephone Encounter (Signed)
Rx(s) sent to pharmacy electronically.  

## 2017-12-17 ENCOUNTER — Telehealth: Payer: Self-pay | Admitting: Family Medicine

## 2017-12-17 NOTE — Telephone Encounter (Signed)
Copied from [place] [id number]. Topic: General - Other >> Dec 17, 2017  4:38 PM Yvette Rack wrote: Reason for CRM: Pt daughter [name] requesting triage nurse but she was not with the pt. Per [name] pt had an episode where she felt as though she would pass out and she would like to know what should be done. Kisha requested that a nurse call pt. Cb# [phone]

## 2017-12-18 ENCOUNTER — Encounter: Payer: Self-pay | Admitting: Internal Medicine

## 2017-12-18 ENCOUNTER — Ambulatory Visit (INDEPENDENT_AMBULATORY_CARE_PROVIDER_SITE_OTHER): Payer: Medicare HMO | Admitting: Internal Medicine

## 2017-12-18 VITALS — BP 142/86 | HR 63 | Temp 98.0°F | Ht 64.0 in | Wt 166.0 lb

## 2017-12-18 DIAGNOSIS — R42 Dizziness and giddiness: Secondary | ICD-10-CM

## 2017-12-18 MED ORDER — MECLIZINE HCL 25 MG PO TABS: 25.0000 mg | ORAL_TABLET | Freq: Three times a day (TID) | ORAL | 0 refills | Status: DC | PRN

## 2017-12-18 NOTE — Patient Instructions (Signed)
Take the meclizine three times a day till the dizziness improves. Then you can wean off it over a few days.

## 2017-12-18 NOTE — Assessment & Plan Note (Signed)
Classic attack Nothing to suggest stroke Will Rx meclizine till symptoms improve Discussed time course and likeliness of recurrence

## 2017-12-18 NOTE — Progress Notes (Signed)
Subjective:    Patient ID: Joanna Hood, female    DOB: 09-27-1945, 72 y.o.   MRN: [id number]  HPI Here due to dizziness--with daughter  Getting hair done yesterday--sudden "waves of dizziness" Felt that she and the chair were falling It subsided Called daughter to pick her up--the wave of dizziness recurred after standing Got home--sat in chair. Got up and fell back into chair Anytime she moves to get up or move her head--the wave of dizziness comes on  Had mild similar single episode ~ 4 years ago  Walking okay  Current Outpatient Medications on File Prior to Visit  Medication Sig Dispense Refill  . amLODipine (NORVASC) 10 MG tablet TAKE 1 TABLET EVERY DAY 90 tablet 3  . Calcium Carb-Cholecalciferol (CALCIUM-VITAMIN D) 600-400 MG-UNIT TABS Take 1 tablet by mouth daily.    . cholecalciferol (VITAMIN D) 1000 UNITS tablet Take 1,000 Units by mouth daily.    . diclofenac sodium (VOLTAREN) 1 % GEL Apply 1 application topically 3 (three) times daily. 1 Tube 3  . ELIQUIS 5 MG TABS tablet TAKE 1 TABLET TWICE DAILY 180 tablet 1  . furosemide (LASIX) 20 MG tablet TAKE 1 TABLET EVERY DAY 90 tablet 0  . levothyroxine (SYNTHROID, LEVOTHROID) 137 MCG tablet TAKE 1 TABLET (137 MCG TOTAL) BY MOUTH DAILY BEFORE BREAKFAST. 90 tablet 0  . methotrexate (RHEUMATREX) 2.5 MG tablet Take 15 mg by mouth once a week. Caution:Chemotherapy. Protect from light.    . metoprolol tartrate (LOPRESSOR) 25 MG tablet TAKE 1 TABLET TWICE DAILY (NEED MD APPOINTMENT) 180 tablet 0  . pantoprazole (PROTONIX) 40 MG tablet Take 1 tablet (40 mg total) by mouth every Monday, Wednesday, and Friday.    . potassium chloride SA (K-DUR,KLOR-CON) 20 MEQ tablet TAKE 1 TABLET EVERY DAY 90 tablet 3  . ramipril (ALTACE) 10 MG capsule Take 1 capsule (10 mg total) by mouth 2 (two) times daily. 180 capsule 3   No current facility-administered medications on file prior to visit.     Allergies  Allergen Reactions  .  Metronidazole     REACTION: Rash, itching    Past Medical History:  Diagnosis Date  . Atrial fibrillation with RVR ([place]) 2014   persistent  . Chronic diastolic CHF (congestive heart failure) ([place])    HTN  . ESOPHAGEAL STRICTURE 02/19/2005   Qualifier: Diagnosis of  By: Amil Amen MD, Benjamine Mola    . Goiter   . Heart murmur   . History of anemia    unclear cause, now resolved  . History of chicken pox   . History of colon polyps    benign  . HLD (hyperlipidemia)   . Hypothyroidism   . Malignant hypertension longstanding  . Microcytosis   . Osteopenia 12/2013   T -2.3 hip  . Osteoporosis   . Rheumatoid arthritis ([place]) 11/2015   +RF, +CCP, ESR 49, synovitis on exam and [place] 11/2015 [facility])  . Right lower lobe pneumonia ([place]) 07/24/2014  . SOB (shortness of breath) 04/29/2013  . Vitamin D deficiency     Past Surgical History:  Procedure Laterality Date  . CARDIAC CATHETERIZATION  2014   normal per patient  . COLONOSCOPY  11/2015   TA, int hem, rpt 5 yrs [facility])  . LEFT HEART CATHETERIZATION WITH CORONARY ANGIOGRAM N/A 05/01/2013   Procedure: LEFT HEART CATHETERIZATION WITH CORONARY ANGIOGRAM;  Surgeon: Leonie Man, MD;  Location: [facility] CATH LAB;  Service: Cardiovascular;  Laterality: N/A;  . PARTIAL HYSTERECTOMY  1970  fibroids    Family History  Problem Relation Age of Onset  . Cancer Mother        ovarian  . Sudden death Mother   . Diabetes Sister   . Cancer Maternal Grandmother        lung  . Heart disease Maternal Grandmother   . Hypertension Sister   . Hypertension Cousin   . CAD Neg Hx   . Stroke Neg Hx     Social History   Socioeconomic History  . Marital status: Single    Spouse name: Not on file  . Number of children: 1  . Years of education: Not on file  . Highest education level: Not on file  Occupational History  . Occupation: Retired  Scientific laboratory technician  . Financial resource strain: Not on file  . Food insecurity:    Worry: Not on file    Inability:  Not on file  . Transportation needs:    Medical: Not on file    Non-medical: Not on file  Tobacco Use  . Smoking status: Former Smoker    Last attempt to quit: 05/16/1969    Years since quitting: 48.6  . Smokeless tobacco: Former Network engineer and Sexual Activity  . Alcohol use: No    Alcohol/week: 0.0 oz  . Drug use: No  . Sexual activity: Never  Lifestyle  . Physical activity:    Days per week: Not on file    Minutes per session: Not on file  . Stress: Not on file  Relationships  . Social connections:    Talks on phone: Not on file    Gets together: Not on file    Attends religious service: Not on file    Active member of club or organization: Not on file    Attends meetings of clubs or organizations: Not on file    Relationship status: Not on file  . Intimate partner violence:    Fear of current or ex partner: Not on file    Emotionally abused: Not on file    Physically abused: Not on file    Forced sexual activity: Not on file  Other Topics Concern  . 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 She appears well-developed. No distress.  HENT:  Mouth/Throat: Oropharynx is clear and moist. No oropharyngeal exudate.  Eyes: EOM are normal.  No nystagmus  Neurological: She is alert and oriented to person, place, and time. She has normal strength. No cranial nerve deficit. She exhibits normal muscle tone. She displays a negative Romberg sign. Coordination and gait normal.           Assessment & Plan:

## 2017-12-18 NOTE — Telephone Encounter (Signed)
See office visit note for 12/18/17.

## 2017-12-20 ENCOUNTER — Telehealth: Payer: Self-pay

## 2017-12-20 NOTE — Telephone Encounter (Signed)
noted 

## 2017-12-20 NOTE — Telephone Encounter (Signed)
Per chart review tab pt was seen at [facility].

## 2017-12-20 NOTE — Telephone Encounter (Signed)
PLEASE NOTE: All timestamps contained within this report are represented as [place] Standard Time. CONFIDENTIALTY NOTICE: This fax transmission is intended only for the addressee. It contains information that is legally privileged, confidential or otherwise protected from use or disclosure. If you are not the intended recipient, you are strictly prohibited from reviewing, disclosing, copying using or disseminating any of this information or taking any action in reliance on or regarding this information. If you have received this fax in error, please notify us immediately by telephone so that we can arrange for its return to [place]. Phone: [phone], Toll-Free: [phone], Fax: [phone] Page: 1 of 2 Call Id: [id number] [place] Patient Name: Joanna Hood Gender: Female DOB: 10-12-1945 Age: 72 Y 72 M 20 D Return Phone Number: [phone] (Primary), [phone] (Secondary) Address[place] [address] Client [facility] Night - Client Client Site [place] Physician Ria Bush - MD Contact Type Call Who Is Calling Patient / Member / Family / Caregiver Call Type Triage / Clinical Caller Name [name] Relationship To Patient Daughter Return Phone Number [phone] (Primary) Chief Complaint Seizure Reason for Call Symptomatic / Request for [place] states her mother is dizzy and had a seizure. Translation No Nurse Assessment Nurse: Jimmye Norman, RN, [place] Date/Time (Eastern Time): 12/18/2017 8:09:16 AM Confirm and document reason for call. If symptomatic, describe symptoms. ---Caller states her mother is dizzy and may have had a seizure. States she was at the beauty shop yesterday, was shaking/dizzy and felt like she was going to pass out. Has been still feeling dizzy and not wanting  to get up. Does the patient have any new or worsening symptoms? ---Yes Will a triage be completed? ---Yes Related visit to physician within the last 2 weeks? ---No Does the PT have any chronic conditions? (i.e. diabetes, asthma, etc.) ---Yes List chronic conditions. ---hypertension, hyperthryoidism Is this a behavioral health or substance abuse call? ---No Guidelines Guideline Title Affirmed Question Affirmed Notes Nurse Date/Time (Eastern Time) Dizziness - Vertigo [1] Dizziness (vertigo) present now AND [2] one or more stroke risk factors (i.e., hypertension, diabetes, prior stroke/ TIA/heart attack) (Exception: prior physician evaluation for this AND no different/ worse than usual) Jimmye Norman, RN, Whitney 12/18/2017 8:11:20 AM Disp. Time Eilene Ghazi Time) Disposition Final User PLEASE NOTE: All timestamps contained within this report are represented as [place] Standard Time. CONFIDENTIALTY NOTICE: This fax transmission is intended only for the addressee. It contains information that is legally privileged, confidential or otherwise protected from use or disclosure. If you are not the intended recipient, you are strictly prohibited from reviewing, disclosing, copying using or disseminating any of this information or taking any action in reliance on or regarding this information. If you have received this fax in error, please notify us immediately by telephone so that we can arrange for its return to [place]. Phone: [phone], Toll-Free: [phone], Fax: [phone] Page: 2 of 2 Call Id: [id number] 12/18/2017 8:15:26 AM Go to ED Now (or PCP triage) Yes Jimmye Norman, RN, Whitney Caller Disagree/Comply Comply Caller Understands Yes PreDisposition InappropriateToAsk Care Advice Given Per Guideline * IF NO PCP (PRIMARY CARE PROVIDER) SECOND-LEVEL TRIAGE: You need to be seen within the next hour. Go to the [facility] at _____________ [place] as soon as you can. NOTE TO TRIAGER - DRIVING: *  Another adult should drive. BRING MEDICINES: Referrals [facility]

## 2018-01-10 ENCOUNTER — Other Ambulatory Visit: Payer: Self-pay | Admitting: Cardiovascular Disease

## 2018-01-17 ENCOUNTER — Other Ambulatory Visit: Payer: Self-pay | Admitting: Cardiovascular Disease

## 2018-01-19 ENCOUNTER — Other Ambulatory Visit: Payer: Self-pay | Admitting: Family Medicine

## 2018-03-24 DIAGNOSIS — Z79899 Other long term (current) drug therapy: Secondary | ICD-10-CM | POA: Diagnosis not present

## 2018-03-24 DIAGNOSIS — M0579 Rheumatoid arthritis with rheumatoid factor of multiple sites without organ or systems involvement: Secondary | ICD-10-CM | POA: Diagnosis not present

## 2018-03-24 DIAGNOSIS — E663 Overweight: Secondary | ICD-10-CM | POA: Diagnosis not present

## 2018-03-24 DIAGNOSIS — Z1589 Genetic susceptibility to other disease: Secondary | ICD-10-CM | POA: Diagnosis not present

## 2018-03-24 DIAGNOSIS — M2550 Pain in unspecified joint: Secondary | ICD-10-CM | POA: Diagnosis not present

## 2018-03-24 DIAGNOSIS — Z6828 Body mass index (BMI) 28.0-28.9, adult: Secondary | ICD-10-CM | POA: Diagnosis not present

## 2018-04-05 ENCOUNTER — Other Ambulatory Visit: Payer: Self-pay | Admitting: Cardiovascular Disease

## 2018-04-27 ENCOUNTER — Other Ambulatory Visit: Payer: Self-pay | Admitting: Family Medicine

## 2018-04-27 ENCOUNTER — Other Ambulatory Visit: Payer: Self-pay | Admitting: *Deleted

## 2018-04-27 MED ORDER — POTASSIUM CHLORIDE CRYS ER 20 MEQ PO TBCR: 20.0000 meq | EXTENDED_RELEASE_TABLET | Freq: Every day | ORAL | 2 refills | Status: DC

## 2018-04-27 MED ORDER — PANTOPRAZOLE SODIUM 40 MG PO TBEC: 40.0000 mg | DELAYED_RELEASE_TABLET | ORAL | 2 refills | Status: DC

## 2018-05-11 ENCOUNTER — Ambulatory Visit (INDEPENDENT_AMBULATORY_CARE_PROVIDER_SITE_OTHER): Payer: Medicare HMO | Admitting: Family Medicine

## 2018-05-11 ENCOUNTER — Encounter: Payer: Self-pay | Admitting: Family Medicine

## 2018-05-11 ENCOUNTER — Ambulatory Visit (INDEPENDENT_AMBULATORY_CARE_PROVIDER_SITE_OTHER)
Admission: RE | Admit: 2018-05-11 | Discharge: 2018-05-11 | Disposition: A | Discharge status: Home / Self Care | Payer: Medicare HMO | Source: Ambulatory Visit | Attending: Family Medicine | Admitting: Family Medicine

## 2018-05-11 VITALS — BP 154/80 | HR 79 | Temp 99.2°F | Ht 64.0 in | Wt 160.5 lb

## 2018-05-11 DIAGNOSIS — J189 Pneumonia, unspecified organism: Secondary | ICD-10-CM | POA: Diagnosis not present

## 2018-05-11 DIAGNOSIS — R509 Fever, unspecified: Secondary | ICD-10-CM | POA: Diagnosis not present

## 2018-05-11 DIAGNOSIS — R059 Cough, unspecified: Secondary | ICD-10-CM

## 2018-05-11 DIAGNOSIS — R05 Cough: Secondary | ICD-10-CM

## 2018-05-11 MED ORDER — GUAIFENESIN-CODEINE 100-10 MG/5ML PO SYRP: 5.0000 mL | ORAL_SOLUTION | Freq: Two times a day (BID) | ORAL | 0 refills | Status: DC | PRN

## 2018-05-11 MED ORDER — AMOXICILLIN-POT CLAVULANATE 875-125 MG PO TABS: 1.0000 | ORAL_TABLET | Freq: Two times a day (BID) | ORAL | 0 refills | Status: AC

## 2018-05-11 MED ORDER — AZITHROMYCIN 250 MG PO TABS: ORAL_TABLET | ORAL | 0 refills | Status: DC

## 2018-05-11 NOTE — Patient Instructions (Signed)
I am worried about pneumonia given symptoms.  Xray today Treat with zpack and augmentin antiboitic. Push fluids and rest. Let us know if not improving with treatment.

## 2018-05-11 NOTE — Progress Notes (Signed)
BP (!) 154/80 (BP Location: Right Arm, Patient Position: Sitting, Cuff Size: Normal)   Pulse 79   Temp 99.2 F (37.3 C) (Oral)   Ht 5\' 4"  (1.626 m)   Wt 160 lb 8 oz (72.8 kg)   SpO2 98%   BMI 27.55 kg/m    CC: cough Subjective:    Patient ID: Crista Curb, female    DOB: [birth year], 72 y.o.   MRN: [id number]  HPI: Shae Hinnenkamp is a 72 y.o. female presenting on 05/11/2018 for Cough (C/o cough for more than 1 wk. Difficult to cough up mucous. Pt accompanied by her granddaughter, Burman Nieves.)   Didn't take BP meds today.  She had episode of vertigo earlier this year - seen at [facility].   1+ wk h/o non productive cough, fatigue and body aches - stayed in bed all day on Sunday. Fever to 102 yesterday. Initial ST now improved.   No ear or tooth pain, dyspnea or wheezing, HA, PNdrainage. No congestion.    Has tried home remedies.  + sick contacts at home - lots of sick children.  Non smoker.  No h/o asthma.   On MTX for RA.   Relevant past medical, surgical, family and social history reviewed and updated as indicated. Interim medical history since our last visit reviewed. Allergies and medications reviewed and updated. Outpatient Medications Prior to Visit  Medication Sig Dispense Refill  . amLODipine (NORVASC) 10 MG tablet TAKE 1 TABLET EVERY DAY 90 tablet 0  . Calcium Carb-Cholecalciferol (CALCIUM-VITAMIN D) 600-400 MG-UNIT TABS Take 1 tablet by mouth daily.    . cholecalciferol (VITAMIN D) 1000 UNITS tablet Take 1,000 Units by mouth daily.    Marland Kitchen ELIQUIS 5 MG TABS tablet TAKE 1 TABLET TWICE DAILY 180 tablet 0  . furosemide (LASIX) 20 MG tablet TAKE 1 TABLET EVERY DAY 90 tablet 3  . levothyroxine (SYNTHROID, LEVOTHROID) 137 MCG tablet TAKE 1 TABLET (137 MCG TOTAL) BY MOUTH DAILY BEFORE BREAKFAST. 90 tablet 1  . meclizine (ANTIVERT) 25 MG tablet Take 1 tablet (25 mg total) by mouth 3 (three) times daily as needed. For vertigo 90 tablet 0  . methotrexate (RHEUMATREX) 2.5  MG tablet Take 15 mg by mouth once a week. Caution:Chemotherapy. Protect from light.    . metoprolol tartrate (LOPRESSOR) 25 MG tablet TAKE 1 TABLET TWICE DAILY (NEED MD APPOINTMENT) 180 tablet 3  . pantoprazole (PROTONIX) 40 MG tablet Take 1 tablet (40 mg total) by mouth every Monday, Wednesday, and Friday. 13 tablet 2  . potassium chloride SA (K-DUR,KLOR-CON) 20 MEQ tablet Take 1 tablet (20 mEq total) by mouth daily. 90 tablet 2  . ramipril (ALTACE) 10 MG capsule Take 1 capsule (10 mg total) by mouth 2 (two) times daily. 180 capsule 3  . diclofenac sodium (VOLTAREN) 1 % GEL Apply 1 application topically 3 (three) times daily. 1 Tube 3   No facility-administered medications prior to visit.      Per HPI unless specifically indicated in ROS section below Review of Systems     Objective:    BP (!) 154/80 (BP Location: Right Arm, Patient Position: Sitting, Cuff Size: Normal)   Pulse 79   Temp 99.2 F (37.3 C) (Oral)   Ht 5\' 4"  (1.626 m)   Wt 160 lb 8 oz (72.8 kg)   SpO2 98%   BMI 27.55 kg/m   Wt Readings from Last 3 Encounters:  05/11/18 160 lb 8 oz (72.8 kg)  12/18/17 166 lb (75.3 kg)  11/19/17 168 lb 12.8 oz (76.6 kg)    Physical Exam  Constitutional: She appears well-developed and well-nourished. No distress.  HENT:  Head: Normocephalic and atraumatic.  Right Ear: Hearing, tympanic membrane, external ear and ear canal normal.  Left Ear: Hearing, tympanic membrane, external ear and ear canal normal.  Nose: No mucosal edema or rhinorrhea. Right sinus exhibits no maxillary sinus tenderness and no frontal sinus tenderness. Left sinus exhibits no maxillary sinus tenderness and no frontal sinus tenderness.  Mouth/Throat: Uvula is midline, oropharynx is clear and moist and mucous membranes are normal. No oropharyngeal exudate, posterior oropharyngeal edema, posterior oropharyngeal erythema or tonsillar abscesses.  Eyes: Pupils are equal, round, and reactive to light. Conjunctivae and  EOM are normal. No scleral icterus.  Neck: Normal range of motion. Neck supple.  Cardiovascular: Normal rate and intact distal pulses. An irregular rhythm present.  Murmur (faint systolic) heard. Pulmonary/Chest: No respiratory distress. She has decreased breath sounds. She has no wheezes. She has rhonchi (faint bibasilar). She has rales (R sided).  Lymphadenopathy:    She has no cervical adenopathy.  Skin: Skin is warm and dry. No rash noted.  Nursing note and vitals reviewed.  Lab Results  Component Value Date   CREATININE 0.81 04/29/2017   BUN 16 04/29/2017   NA 139 04/29/2017   K 4.3 04/29/2017   CL 104 04/29/2017   CO2 30 04/29/2017       Assessment & Plan:   Problem List Items Addressed This Visit    Community acquired pneumonia of right lung - Primary    Concern for community acquired pneumonia given ongoing fever and clinical findings of R sided rales. Check CXR today.  On MTX. Cover with zpack and augmentin course.  Update if not improving with treatment.  Pt agrees with plan.       Relevant Medications   amoxicillin-clavulanate (AUGMENTIN) 875-125 MG tablet   azithromycin (ZITHROMAX) 250 MG tablet   guaiFENesin-codeine (CHERATUSSIN AC) 100-10 MG/5ML syrup    Other Visit Diagnoses    Cough       Relevant Orders   DG Chest 2 View   Fever, unspecified fever cause       Relevant Orders   DG Chest 2 View       Meds ordered this encounter  Medications  . amoxicillin-clavulanate (AUGMENTIN) 875-125 MG tablet    Sig: Take 1 tablet by mouth 2 (two) times daily for 10 days.    Dispense:  20 tablet    Refill:  0  . azithromycin (ZITHROMAX) 250 MG tablet    Sig: Take two tablets on day one followed by one tablet on days 2-5    Dispense:  6 each    Refill:  0  . guaiFENesin-codeine (CHERATUSSIN AC) 100-10 MG/5ML syrup    Sig: Take 5 mLs by mouth 2 (two) times daily as needed for cough (sedation precautions).    Dispense:  120 mL    Refill:  0   Orders Placed  This Encounter  Procedures  . DG Chest 2 View    Standing Status:   Future    Number of Occurrences:   1    Standing Expiration Date:   07/12/2019    Order Specific Question:   Reason for Exam (SYMPTOM  OR DIAGNOSIS REQUIRED)    Answer:   RLL rales, fever, cough, on methotrexate    Order Specific Question:   Preferred imaging location?    Answer:   [facility]  Order Specific Question:   Radiology Contrast Protocol - do NOT remove file path    Answer:   [email or web address]    Follow up plan: No follow-ups on file.  Ria Bush, MD

## 2018-05-11 NOTE — Assessment & Plan Note (Signed)
Concern for community acquired pneumonia given ongoing fever and clinical findings of R sided rales. Check CXR today.  On MTX. Cover with zpack and augmentin course.  Update if not improving with treatment.  Pt agrees with plan.

## 2018-05-24 ENCOUNTER — Other Ambulatory Visit: Payer: Self-pay | Admitting: Family Medicine

## 2018-05-24 DIAGNOSIS — E559 Vitamin D deficiency, unspecified: Secondary | ICD-10-CM

## 2018-05-24 DIAGNOSIS — I10 Essential (primary) hypertension: Secondary | ICD-10-CM

## 2018-05-24 DIAGNOSIS — E039 Hypothyroidism, unspecified: Secondary | ICD-10-CM

## 2018-05-24 DIAGNOSIS — D509 Iron deficiency anemia, unspecified: Secondary | ICD-10-CM

## 2018-05-24 DIAGNOSIS — M0579 Rheumatoid arthritis with rheumatoid factor of multiple sites without organ or systems involvement: Secondary | ICD-10-CM

## 2018-05-26 ENCOUNTER — Ambulatory Visit (INDEPENDENT_AMBULATORY_CARE_PROVIDER_SITE_OTHER): Payer: Medicare HMO

## 2018-05-26 ENCOUNTER — Ambulatory Visit: Payer: Medicare HMO

## 2018-05-26 VITALS — BP 132/78 | HR 55 | Temp 97.9°F | Ht 63.5 in | Wt 162.0 lb

## 2018-05-26 DIAGNOSIS — D509 Iron deficiency anemia, unspecified: Secondary | ICD-10-CM | POA: Diagnosis not present

## 2018-05-26 DIAGNOSIS — Z0000 Encounter for general adult medical examination without abnormal findings: Secondary | ICD-10-CM

## 2018-05-26 DIAGNOSIS — E039 Hypothyroidism, unspecified: Secondary | ICD-10-CM | POA: Diagnosis not present

## 2018-05-26 DIAGNOSIS — E559 Vitamin D deficiency, unspecified: Secondary | ICD-10-CM

## 2018-05-26 DIAGNOSIS — M0579 Rheumatoid arthritis with rheumatoid factor of multiple sites without organ or systems involvement: Secondary | ICD-10-CM | POA: Diagnosis not present

## 2018-05-26 DIAGNOSIS — I10 Essential (primary) hypertension: Secondary | ICD-10-CM

## 2018-05-26 LAB — CBC WITH DIFFERENTIAL/PLATELET
BASOS ABS: 0 10*3/uL (ref 0.0–0.1)
Basophils Relative: 1.4 % (ref 0.0–3.0)
Eosinophils Absolute: 0.1 10*3/uL (ref 0.0–0.7)
Eosinophils Relative: 2 % (ref 0.0–5.0)
HCT: 34.8 % — ABNORMAL LOW (ref 36.0–46.0)
HEMOGLOBIN: 11 g/dL — AB (ref 12.0–15.0)
Lymphocytes Relative: 31.2 % (ref 12.0–46.0)
Lymphs Abs: 1.1 10*3/uL (ref 0.7–4.0)
MCHC: 31.6 g/dL (ref 30.0–36.0)
MCV: 78.4 fl (ref 78.0–100.0)
Monocytes Absolute: 0.5 10*3/uL (ref 0.1–1.0)
Monocytes Relative: 13.1 % — ABNORMAL HIGH (ref 3.0–12.0)
Neutro Abs: 1.9 10*3/uL (ref 1.4–7.7)
Neutrophils Relative %: 52.3 % (ref 43.0–77.0)
Platelets: 234 10*3/uL (ref 150.0–400.0)
RBC: 4.45 Mil/uL (ref 3.87–5.11)
RDW: 15.6 % — ABNORMAL HIGH (ref 11.5–15.5)
WBC: 3.6 10*3/uL — ABNORMAL LOW (ref 4.0–10.5)

## 2018-05-26 LAB — LIPID PANEL
Cholesterol: 140 mg/dL (ref 0–200)
HDL: 33.3 mg/dL — ABNORMAL LOW (ref >39.00)
LDL Cholesterol: 92 mg/dL (ref 0–99)
NONHDL: 106.58
Total CHOL/HDL Ratio: 4
Triglycerides: 73 mg/dL (ref 0.0–149.0)
VLDL: 14.6 mg/dL (ref 0.0–40.0)

## 2018-05-26 LAB — COMPREHENSIVE METABOLIC PANEL
ALT: 22 U/L (ref 0–35)
AST: 20 U/L (ref 0–37)
Albumin: 3.9 g/dL (ref 3.5–5.2)
Alkaline Phosphatase: 78 U/L (ref 39–117)
BUN: 9 mg/dL (ref 6–23)
CO2: 30 meq/L (ref 19–32)
Calcium: 9.2 mg/dL (ref 8.4–10.5)
Chloride: 104 mEq/L (ref 96–112)
Creatinine, Ser: 0.9 mg/dL (ref 0.40–1.20)
GFR: 79.38 mL/min (ref >60.00)
Glucose, Bld: 96 mg/dL (ref 70–99)
Potassium: 3.8 mEq/L (ref 3.5–5.1)
SODIUM: 139 meq/L (ref 135–145)
Total Bilirubin: 0.8 mg/dL (ref 0.2–1.2)
Total Protein: 7.4 g/dL (ref 6.0–8.3)

## 2018-05-26 LAB — VITAMIN D 25 HYDROXY (VIT D DEFICIENCY, FRACTURES): VITD: 24.64 ng/mL — AB (ref 30.00–100.00)

## 2018-05-26 LAB — T4, FREE: Free T4: 1.22 ng/dL (ref 0.60–1.60)

## 2018-05-26 LAB — TSH: TSH: 0.31 u[IU]/mL — ABNORMAL LOW (ref 0.35–4.50)

## 2018-05-26 NOTE — Progress Notes (Signed)
Subjective:   Mikea Kassia Demarinis is a 72 y.o. female who presents for Medicare Annual (Subsequent) preventive examination.  Review of Systems:  N/A Cardiac Risk Factors include: advanced age (>79mn, >>36women);obesity (BMI >30kg/m2);hypertension     Objective:     Vitals: BP 132/78 (BP Location: Right Arm, Patient Position: Sitting, Cuff Size: Normal)   Pulse (!) 55   Temp 97.9 F (36.6 C) (Oral)   Ht 5' 3.5" (1.613 m) Comment: shoes  Wt 162 lb (73.5 kg)   SpO2 99%   BMI 28.25 kg/m   Body mass index is 28.25 kg/m.  Advanced Directives 05/26/2018 04/23/2016 09/22/2015 05/01/2013 04/29/2013  Does Patient Have a Medical Advance Directive? No No No Patient does not have advance directive;Patient would like information Patient does not have advance directive;Patient would like information  Would patient like information on creating a medical advance directive? No - Patient declined No - patient declined information - Advance directive packet given Advance directive packet given  Pre-existing out of facility DNR order (yellow form or pink MOST form) - - - No -    Tobacco Social History   Tobacco Use  Smoking Status Former Smoker  . Last attempt to quit: 05/16/1969  . Years since quitting: 49.0  Smokeless Tobacco Former UEngineer, structuralgiven: No   Clinical Intake:  Pre-visit preparation completed: Yes  Pain : No/denies pain Pain Score: 0-No pain     Nutritional Status: BMI 25 -29 Overweight Nutritional Risks: None Diabetes: No  How often do you need to have someone help you when you read instructions, pamphlets, or other written materials from your doctor or pharmacy?: 1 - Never What is the last grade level you completed in school?: 12th grade + 1 yr college  Interpreter Needed?: No  Comments: pt lives alone Information entered by :: LPinson, LPN  Past Medical History:  Diagnosis Date  . Atrial fibrillation with RVR ([facility] 2014   persistent  . Chronic  diastolic CHF (congestive heart failure) ([facility]    HTN  . ESOPHAGEAL STRICTURE 02/19/2005   Qualifier: Diagnosis of  By: MAmil AmenMD, EBenjamine Mola   . Goiter   . Heart murmur   . History of anemia    unclear cause, now resolved  . History of chicken pox   . History of colon polyps    benign  . HLD (hyperlipidemia)   . Hypothyroidism   . Malignant hypertension longstanding  . Microcytosis   . Osteopenia 12/2013   T -2.3 hip  . Osteoporosis   . Rheumatoid arthritis ([facility] 11/2015   +RF, +CCP, ESR 49, synovitis on exam and [place]6/2017 ([facility]  . Right [place] lobe pneumonia [place] 07/24/2014  . SOB (shortness of breath) 04/29/2013  . Vitamin D deficiency    Past Surgical History:  Procedure Laterality Date  . CARDIAC CATHETERIZATION  2014   normal per patient  . COLONOSCOPY  11/2015   TA, int hem, rpt 5 yrs ([facility]  . LEFT HEART CATHETERIZATION WITH CORONARY ANGIOGRAM N/A 05/01/2013   Procedure: LEFT HEART CATHETERIZATION WITH CORONARY ANGIOGRAM;  Surgeon: DLeonie Man MD;  [facility];  Service: Cardiovascular;  Laterality: N/A;  . PARTIAL HYSTERECTOMY  1970   fibroids   Family History  Problem Relation Age of Onset  . Cancer Mother        ovarian  . Sudden death Mother   . Diabetes Sister   . Cancer Maternal Grandmother  lung  . Heart disease Maternal Grandmother   . Hypertension Sister   . Hypertension Cousin   . CAD Neg Hx   . Stroke Neg Hx    Social History   Socioeconomic History  . Marital status: Single    Spouse name: Not on file  . Number of children: 1  . Years of education: Not on file  . Highest education level: Not on file  Occupational History  . Occupation: Retired  Scientific laboratory technician  . Financial resource strain: Not on file  . Food insecurity:    Worry: Not on file    Inability: Not on file  . Transportation needs:    Medical: Not on file    Non-medical: Not on file  Tobacco Use  . Smoking status: Former Smoker    Last attempt to  quit: 05/16/1969    Years since quitting: 49.0  . Smokeless tobacco: Former Network engineer and Sexual Activity  . Alcohol use: No    Alcohol/week: 0.0 standard drinks  . Drug use: No  . Sexual activity: Never  Lifestyle  . Physical activity:    Days per week: Not on file    Minutes per session: Not on file  . Stress: Not on file  Relationships  . Social connections:    Talks on phone: Not on file    Gets together: Not on file    Attends religious service: Not on file    Active member of club or organization: Not on file    Attends meetings of clubs or organizations: Not on file    Relationship status: Not on file  Other Topics Concern  . 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 amLODipine (NORVASC) 10 MG tablet TAKE 1 TABLET EVERY DAY  . Calcium Carb-Cholecalciferol (CALCIUM-VITAMIN D) 600-400 MG-UNIT TABS Take 1 tablet by mouth daily.  . cholecalciferol (VITAMIN D) 1000 UNITS tablet Take 1,000 Units by mouth daily.  Marland Kitchen ELIQUIS 5 MG TABS tablet TAKE 1 TABLET TWICE DAILY  . furosemide (LASIX) 20 MG tablet TAKE 1 TABLET EVERY DAY  . levothyroxine (SYNTHROID, LEVOTHROID) 137 MCG tablet TAKE 1 TABLET (137 MCG TOTAL) BY MOUTH DAILY BEFORE BREAKFAST.  Marland Kitchen meclizine (ANTIVERT) 25 MG tablet Take 1 tablet (25 mg total) by mouth 3 (three) times daily as needed. For vertigo  . methotrexate (RHEUMATREX) 2.5 MG tablet Take 15 mg by mouth once a week. Caution:Chemotherapy. Protect from light.  . metoprolol tartrate (LOPRESSOR) 25 MG tablet TAKE 1 TABLET TWICE DAILY (NEED MD APPOINTMENT)  . pantoprazole (PROTONIX) 40 MG tablet Take 1 tablet (40 mg total) by mouth every Monday, Wednesday, and Friday.  . potassium chloride SA (K-DUR,KLOR-CON) 20 MEQ tablet Take 1 tablet (20 mEq total) by mouth  daily.  . ramipril (ALTACE) 10 MG capsule Take 1 capsule (10 mg total) by mouth 2 (two) times daily.  Marland Kitchen guaiFENesin-codeine (CHERATUSSIN AC) 100-10 MG/5ML syrup Take 5 mLs by mouth 2 (two) times daily as needed for cough (sedation precautions). (Patient not taking: Reported on 05/26/2018)  . [DISCONTINUED] azithromycin (ZITHROMAX) 250 MG tablet Take two tablets on day one followed by one tablet on days 2-5   No facility-administered encounter medications on file as of 05/26/2018.     Activities of Daily Living In your present  state of health, do you have any difficulty performing the following activities: 05/26/2018  Hearing? N  Vision? N  Difficulty concentrating or making decisions? N  Walking or climbing stairs? N  Dressing or bathing? N  Doing errands, shopping? N  Preparing Food and eating ? N  Using the Toilet? N  In the past six months, have you accidently leaked urine? N  Do you have problems with loss of bowel control? N  Managing your Medications? N  Managing your Finances? N  Housekeeping or managing your Housekeeping? N  Some recent data might be hidden    Patient Care Team: Ria Bush, MD as PCP - General (Family Medicine) Lorretta Harp, MD as Consulting Physician (Cardiology) Gavin Pound, MD as Consulting Physician (Rheumatology) Shirl Harris, OD as Consulting Physician (Optometry)    Assessment:   This is a routine wellness examination for [place].   Hearing Screening   125Hz  250Hz  500Hz  1000Hz  2000Hz  3000Hz  4000Hz  6000Hz  8000Hz   Right ear:   40 40 40  40    Left ear:   40 40 40  40      Visual Acuity Screening   Right eye Left eye Both eyes  Without correction:     With correction: 20/50 20/40-1 20/30    Exercise Activities and Dietary recommendations Current Exercise Habits: The patient does not participate in regular exercise at present, Exercise limited by: None identified  Goals    . DIET - INCREASE WATER INTAKE     Starting 05/26/2018,  I will continue to drink at least 6-8 glasses of water daily.        Fall Risk Fall Risk  05/26/2018 05/20/2017 04/23/2016 04/23/2015 11/03/2013  Falls in the past year? 0 No No No No   Depression Screen PHQ 2/9 Scores 05/26/2018 05/20/2017 04/23/2016 04/23/2015  PHQ - 2 Score 0 0 0 0  PHQ- 9 Score 0 - - -     Cognitive Function MMSE - Mini Mental State Exam 05/26/2018 04/23/2016  Orientation to time 5 5  Orientation to Place 5 5  Registration 3 3  Attention/ Calculation 0 0  Recall 3 3  Language- name 2 objects 0 0  Language- repeat 1 1  Language- follow 3 step command 3 3  Language- read & follow direction 0 0  Write a sentence 0 0  Copy design 0 0  Total score 20 20       PLEASE NOTE: A Mini-Cog screen was completed. Maximum score is 20. A value of 0 denotes this part of Folstein MMSE was not completed or the patient failed this part of the Mini-Cog screening.   Mini-Cog Screening Orientation to Time - Max 5 pts Orientation to Place - Max 5 pts Registration - Max 3 pts Recall - Max 3 pts Language Repeat - Max 1 pts Language Follow 3 Step Command - Max 3 pts   Immunization History  Administered Date(s) Administered  . Influenza Whole 07/05/2009, 05/14/2010  . Influenza, High Dose Seasonal PF 04/03/2014, 03/17/2017, 04/05/2018  . Influenza,inj,Quad PF,6+ Mos 04/30/2013, 04/23/2016  . Influenza-Unspecified 04/12/2015  . Pneumococcal Conjugate-13 04/23/2015  . Pneumococcal Polysaccharide-23 04/30/2013  . Td 06/23/2003  . Tdap 04/23/2016    Screening Tests Health Maintenance  Topic Date Due  . MAMMOGRAM  09/04/2018  . COLONOSCOPY  11/21/2025  . DTaP/Tdap/Td (2 - Td) 04/23/2026  . TETANUS/TDAP  04/23/2026  . INFLUENZA VACCINE  Completed  . DEXA SCAN  Completed  . Hepatitis C Screening  Completed  . PNA vac Low Risk Adult  Completed      Plan:     I have personally reviewed, addressed, and noted the following in the patient's chart:  A. Medical and social  history B. Use of alcohol, tobacco or illicit drugs  C. Current medications and supplements D. Functional ability and status E.  Nutritional status F.  Physical activity G. Advance directives H. List of other physicians I.  Hospitalizations, surgeries, and ER visits in previous 12 months J.  [place] to include hearing, vision, cognitive, depression L. Referrals and appointments - none  In addition, I have reviewed and discussed with patient certain preventive protocols, quality metrics, and best practice recommendations. A written personalized care plan for preventive services as well as general preventive health recommendations were provided to patient.  See attached scanned questionnaire for additional information.   Signed,   Lindell Noe, MHA, BS, LPN Health Coach

## 2018-05-26 NOTE — Patient Instructions (Signed)
Joanna Hood , Thank you for taking time to come for your Medicare Wellness Visit. I appreciate your ongoing commitment to your health goals. Please review the following plan we discussed and let me know if I can assist you in the future.   These are the goals we discussed: Goals    . DIET - INCREASE WATER INTAKE     Starting 05/26/2018, I will continue to drink at least 6-8 glasses of water daily.        This is a list of the screening recommended for you and due dates:  Health Maintenance  Topic Date Due  . Mammogram  09/04/2018  . Colon Cancer Screening  11/21/2025  . DTaP/Tdap/Td vaccine (2 - Td) 04/23/2026  . Tetanus Vaccine  04/23/2026  . Flu Shot  Completed  . DEXA scan (bone density measurement)  Completed  .  Hepatitis C: One time screening is recommended by Center for Disease Control  (CDC) for  adults born from [birth year] through 1964.   Completed  . Pneumonia vaccines  Completed   Preventive Care for Adults  A healthy lifestyle and preventive care can promote health and wellness. Preventive health guidelines for adults include the following key practices.  . A routine yearly physical is a good way to check with your health care provider about your health and preventive screening. It is a chance to share any concerns and updates on your health and to receive a thorough exam.  . Visit your dentist for a routine exam and preventive care every 6 months. Brush your teeth twice a day and floss once a day. Good oral hygiene prevents tooth decay and gum disease.  . The frequency of eye exams is based on your age, health, family medical history, use  of contact lenses, and other factors. Follow your health care provider's recommendations for frequency of eye exams.  . Eat a healthy diet. Foods like vegetables, fruits, whole grains, low-fat dairy products, and lean protein foods contain the nutrients you need without too many calories. Decrease your intake of foods high in solid fats,  added sugars, and salt. Eat the right amount of calories for you. Get information about a proper diet from your health care provider, if necessary.  . Regular physical exercise is one of the most important things you can do for your health. Most adults should get at least 150 minutes of moderate-intensity exercise (any activity that increases your heart rate and causes you to sweat) each week. In addition, most adults need muscle-strengthening exercises on 2 or more days a week.  Silver Sneakers may be a benefit available to you. To determine eligibility, you may visit the website: www.silversneakers.com or contact program at 408-008-8767 Mon-Fri between 8AM-8PM.   . Maintain a healthy weight. The body mass index (BMI) is a screening tool to identify possible weight problems. It provides an estimate of body fat based on height and weight. Your health care provider can find your BMI and can help you achieve or maintain a healthy weight.   For adults 20 years and older: ? A BMI below 18.5 is considered underweight. ? A BMI of 18.5 to 24.9 is normal. ? A BMI of 25 to 29.9 is considered overweight. ? A BMI of 30 and above is considered obese.   . Maintain normal blood lipids and cholesterol levels by exercising and minimizing your intake of saturated fat. Eat a balanced diet with plenty of fruit and vegetables. Blood tests for lipids and cholesterol should  begin at age 29 and be repeated every 5 years. If your lipid or cholesterol levels are high, you are over 50, or you are at high risk for heart disease, you may need your cholesterol levels checked more frequently. Ongoing high lipid and cholesterol levels should be treated with medicines if diet and exercise are not working.  . If you smoke, find out from your health care provider how to quit. If you do not use tobacco, please do not start.  . If you choose to drink alcohol, please do not consume more than 2 drinks per day. One drink is  considered to be 12 ounces (355 mL) of beer, 5 ounces (148 mL) of wine, or 1.5 ounces (44 mL) of liquor.  . If you are 16-11 years old, ask your health care provider if you should take aspirin to prevent strokes.  . Use sunscreen. Apply sunscreen liberally and repeatedly throughout the day. You should seek shade when your shadow is shorter than you. Protect yourself by wearing long sleeves, pants, a wide-brimmed hat, and sunglasses year round, whenever you are outdoors.  . Once a month, do a whole body skin exam, using a mirror to look at the skin on your back. Tell your health care provider of new moles, moles that have irregular borders, moles that are larger than a pencil eraser, or moles that have changed in shape or color.

## 2018-05-26 NOTE — Progress Notes (Signed)
PCP notes:   Health maintenance:  No gaps identified.  Abnormal screenings:   None  Patient concerns:   None  Nurse concerns:  None  Next PCP appt:   05/30/2018 @ 0830

## 2018-05-28 NOTE — Assessment & Plan Note (Signed)
Preventative protocols reviewed and updated unless pt declined. Discussed healthy diet and lifestyle.  

## 2018-05-28 NOTE — Progress Notes (Signed)
BP 134/64 (BP Location: Left Arm, Patient Position: Sitting, Cuff Size: Normal)   Pulse 61   Temp 97.6 F (36.4 C) (Oral)   Ht 5' 3.5" (1.613 m)   Wt 165 lb 8 oz (75.1 kg)   SpO2 99%   BMI 28.86 kg/m    CC: CPE Subjective:    Patient ID: Joanna Hood, female    DOB: 1946/01/09, 72 y.o.   MRN: [id number]  HPI: Keana Dueitt is a 72 y.o. female presenting on 05/30/2018 for Annual Exam (Pt 2. )   Saw Katha Cabal last week for medicare wellness visit. Note reviewed.    Preventative: COLONOSCOPY 11/2015-TA, int hem, rpt 5 yrs Henrene Pastor).  Well woman exam - partial hysterectomy 1970, ovaries remain.All normal papsmears. No pelvic pain/pressure. Mammogram 08/2017 [facility] 1. DEXA 12/2013 -T-2.3 osteopenia -pt gets calcium/vitamin D dailyand walks regularly.  Flu shot yearly  Pneumovax 04/2013, prevnar2016 Tdap 2017 Shingrix - not interested  Advanced directives: HCPOA would be daughter.Packet at home, encouraged she work on this. Seat belt use discussed Sunscreen use discussed.No changing moles on skin.  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 Interim medical history since our last visit reviewed. Allergies and medications reviewed and updated. Outpatient Medications Prior to Visit  Medication Sig Dispense Refill  . amLODipine (NORVASC) 10 MG tablet TAKE 1 TABLET EVERY DAY 90 tablet 0  . Calcium Carb-Cholecalciferol (CALCIUM-VITAMIN D) 600-400 MG-UNIT TABS Take 1 tablet by mouth daily.    . cholecalciferol (VITAMIN D) 1000 UNITS tablet Take 1,000 Units by mouth daily.    Marland Kitchen ELIQUIS 5 MG TABS tablet TAKE 1 TABLET TWICE DAILY 180  tablet 0  . furosemide (LASIX) 20 MG tablet TAKE 1 TABLET EVERY DAY 90 tablet 3  . meclizine (ANTIVERT) 25 MG tablet Take 1 tablet (25 mg total) by mouth 3 (three) times daily as needed. For vertigo 90 tablet 0  . methotrexate (RHEUMATREX) 2.5 MG tablet Take 15 mg by mouth once a week. Caution:Chemotherapy. Protect from light.    . metoprolol tartrate (LOPRESSOR) 25 MG tablet TAKE 1 TABLET TWICE DAILY (NEED MD APPOINTMENT) 180 tablet 3  . pantoprazole (PROTONIX) 40 MG tablet Take 1 tablet (40 mg total) by mouth every Monday, Wednesday, and Friday. 13 tablet 2  . potassium chloride SA (K-DUR,KLOR-CON) 20 MEQ tablet Take 1 tablet (20 mEq total) by mouth daily. 90 tablet 2  . ramipril (ALTACE) 10 MG capsule Take 1 capsule (10 mg total) by mouth 2 (two) times daily. 180 capsule 3  . guaiFENesin-codeine (CHERATUSSIN AC) 100-10 MG/5ML syrup Take 5 mLs by mouth 2 (two) times daily as needed for cough (sedation precautions). 120 mL 0  . levothyroxine (SYNTHROID, LEVOTHROID) 137 MCG tablet TAKE 1 TABLET (137 MCG TOTAL) BY MOUTH DAILY BEFORE BREAKFAST. 90 tablet 1   No facility-administered medications prior to visit.      Per HPI unless specifically indicated in ROS section below Review of Systems  Constitutional: Negative for activity change, appetite change, chills, fatigue, fever and unexpected weight change.  HENT: Negative for hearing loss.   Eyes: Negative for visual disturbance.  Respiratory: Negative for cough, chest tightness, shortness of breath and wheezing.   Cardiovascular: Negative for chest pain,  palpitations and leg swelling.  Gastrointestinal: Negative for abdominal distention, abdominal pain, blood in stool, constipation, diarrhea, nausea and vomiting.  Genitourinary: Negative for difficulty urinating and hematuria.  Musculoskeletal: Negative for arthralgias, myalgias and neck pain.  Skin: Negative for rash.  Neurological: Negative for dizziness, seizures, syncope and headaches.    Hematological: Negative for adenopathy. Does not bruise/bleed easily.  Psychiatric/Behavioral: Negative for dysphoric mood. The patient is not nervous/anxious.        Objective:    BP 134/64 (BP Location: Left Arm, Patient Position: Sitting, Cuff Size: Normal)   Pulse 61   Temp 97.6 F (36.4 C) (Oral)   Ht 5' 3.5" (1.613 m)   Wt 165 lb 8 oz (75.1 kg)   SpO2 99%   BMI 28.86 kg/m   Wt Readings from Last 3 Encounters:  05/30/18 165 lb 8 oz (75.1 kg)  05/26/18 162 lb (73.5 kg)  05/11/18 160 lb 8 oz (72.8 kg)    Physical Exam  Constitutional: She is oriented to person, place, and time. She appears well-developed and well-nourished. No distress.  HENT:  Head: Normocephalic and atraumatic.  Right Ear: Hearing, tympanic membrane, external ear and ear canal normal.  Left Ear: Hearing, tympanic membrane, external ear and ear canal normal.  Nose: Nose normal.  Mouth/Throat: Uvula is midline, oropharynx is clear and moist and mucous membranes are normal. No oropharyngeal exudate, posterior oropharyngeal edema or posterior oropharyngeal erythema.  Eyes: Pupils are equal, round, and reactive to light. Conjunctivae and EOM are normal. No scleral icterus.  Neck: Normal range of motion. Neck supple. Carotid bruit is present (referred from heart). Thyroid mass (nodules bilaterally) present. No thyromegaly present.  Cardiovascular: Normal rate and intact distal pulses. An irregularly irregular rhythm present.  Murmur (3/6 systolic) heard. Pulses:      Radial pulses are 2+ on the right side, and 2+ on the left side.  Pulmonary/Chest: Effort normal and breath sounds normal. No respiratory distress. She has no wheezes. She has no rales.  Abdominal: Soft. Bowel sounds are normal. She exhibits no distension and no mass. There is no tenderness. There is no rebound and no guarding.  Musculoskeletal: Normal range of motion. She exhibits no edema.  Lymphadenopathy:    She has no cervical adenopathy.   Neurological: She is alert and oriented to person, place, and time.  CN grossly intact, station and gait intact  Skin: Skin is warm and dry. No rash noted.  Psychiatric: She has a normal mood and affect. Her behavior is normal. Judgment and thought content normal.  Nursing note and vitals reviewed.  Results for orders placed or performed in visit on 05/26/18  VITAMIN D 25 Hydroxy (Vit-D Deficiency, Fractures)  Result Value Ref Range   VITD 24.64 (L) 30.00 - 100.00 ng/mL  CBC with Differential/Platelet  Result Value Ref Range   WBC 3.6 (L) 4.0 - 10.5 K/uL   RBC 4.45 3.87 - 5.11 Mil/uL   Hemoglobin 11.0 (L) 12.0 - 15.0 g/dL   HCT 34.8 (L) 36.0 - 46.0 %   MCV 78.4 78.0 - 100.0 fl   MCHC 31.6 30.0 - 36.0 g/dL   RDW 15.6 (H) 11.5 - 15.5 %   Platelets 234.0 150.0 - 400.0 K/uL   Neutrophils Relative % 52.3 43.0 - 77.0 %   Lymphocytes Relative 31.2 12.0 - 46.0 %   Monocytes Relative 13.1 (H) 3.0 - 12.0 %   Eosinophils Relative 2.0 0.0 - 5.0 %   Basophils Relative 1.4 0.0 - 3.0 %  Neutro Abs 1.9 1.4 - 7.7 K/uL   Lymphs Abs 1.1 0.7 - 4.0 K/uL   Monocytes Absolute 0.5 0.1 - 1.0 K/uL   Eosinophils Absolute 0.1 0.0 - 0.7 K/uL   Basophils Absolute 0.0 0.0 - 0.1 K/uL  T4, free  Result Value Ref Range   Free T4 1.22 0.60 - 1.60 ng/dL  TSH  Result Value Ref Range   TSH 0.31 (L) 0.35 - 4.50 uIU/mL  Comprehensive metabolic panel  Result Value Ref Range   Sodium 139 135 - 145 mEq/L   Potassium 3.8 3.5 - 5.1 mEq/L   Chloride 104 96 - 112 mEq/L   CO2 30 19 - 32 mEq/L   Glucose, Bld 96 70 - 99 mg/dL   BUN 9 6 - 23 mg/dL   Creatinine, Ser 0.90 0.40 - 1.20 mg/dL   Total Bilirubin 0.8 0.2 - 1.2 mg/dL   Alkaline Phosphatase 78 39 - 117 U/L   AST 20 0 - 37 U/L   ALT 22 0 - 35 U/L   Total Protein 7.4 6.0 - 8.3 g/dL   Albumin 3.9 3.5 - 5.2 g/dL   Calcium 9.2 8.4 - 10.5 mg/dL   GFR 79.38 >60.00 mL/min  Lipid panel  Result Value Ref Range   Cholesterol 140 0 - 200 mg/dL   Triglycerides  73.0 0.0 - 149.0 mg/dL   HDL 33.30 (L) >39.00 mg/dL   VLDL 14.6 0.0 - 40.0 mg/dL   LDL Cholesterol 92 0 - 99 mg/dL   Total CHOL/HDL Ratio 4    NonHDL 106.58       Assessment & Plan:   Problem List Items Addressed This Visit    Vitamin D deficiency    Levels returned low - will continue vit D 2000 IU daily.       Rheumatoid arthritis ([place])    Followed by Dr Trudie Reed on MTX weekly. Appreciate rheum care. New leukopenia ?MTX related. Will continue to monitor.       Persistent atrial fibrillation    Appreciate cards care. Continue eliquis.      Osteopenia    Encouraged continued calcium/vit D and regular weight bearing exercise. Update DEXA in 2020      Leukopenia    New, mild. Will just monitor for now. Has rheum f/u next month.       Hypothyroidism    TSH remains low but pt largely asymptomatic - will lower levothyroxine dose to 160mcg daily and repeat TFTs in 2 months.       Relevant Medications   levothyroxine (SYNTHROID, LEVOTHROID) 125 MCG tablet   Other Relevant Orders   TSH   T4, free   HTN (hypertension), malignant    Chronic, stable. Continue current regimen.       Health maintenance examination - Primary    Preventative protocols reviewed and updated unless pt declined. Discussed healthy diet and lifestyle.       GOITER, MULTINODULAR    Update thyroid US      Relevant Medications   levothyroxine (SYNTHROID, LEVOTHROID) 125 MCG tablet   Other Relevant Orders   US THYROID   Advanced care planning/counseling discussion    Advanced directives: HCPOA would be daughter.Packet at home, encouraged she work on this.          Meds ordered this encounter  Medications  . levothyroxine (SYNTHROID, LEVOTHROID) 125 MCG tablet    Sig: Take 1 tablet (125 mcg total) by mouth daily before breakfast.    Dispense:  90 tablet  Refill:  1    Note new sig   Orders Placed This Encounter  Procedures  . US THYROID    Standing Status:   Future    Standing  Expiration Date:   08/01/2019    Order Specific Question:   Reason for Exam (SYMPTOM  OR DIAGNOSIS REQUIRED)    Answer:   thyroid nodules bilaterally    Order Specific Question:   Preferred imaging location?    Answer:   [facility]  . TSH    Standing Status:   Future    Standing Expiration Date:   05/31/2019  . T4, free    Standing Status:   Future    Standing Expiration Date:   05/31/2019    Follow up plan: Return for annual exam, prior fasting for blood work, medicare wellness visit.  Ria Bush, MD

## 2018-05-30 ENCOUNTER — Ambulatory Visit (INDEPENDENT_AMBULATORY_CARE_PROVIDER_SITE_OTHER): Payer: Medicare HMO | Admitting: Family Medicine

## 2018-05-30 ENCOUNTER — Encounter: Payer: Self-pay | Admitting: Family Medicine

## 2018-05-30 VITALS — BP 134/64 | HR 61 | Temp 97.6°F | Ht 63.5 in | Wt 165.5 lb

## 2018-05-30 DIAGNOSIS — Z0000 Encounter for general adult medical examination without abnormal findings: Secondary | ICD-10-CM | POA: Diagnosis not present

## 2018-05-30 DIAGNOSIS — I4819 Other persistent atrial fibrillation: Secondary | ICD-10-CM

## 2018-05-30 DIAGNOSIS — M0579 Rheumatoid arthritis with rheumatoid factor of multiple sites without organ or systems involvement: Secondary | ICD-10-CM

## 2018-05-30 DIAGNOSIS — E039 Hypothyroidism, unspecified: Secondary | ICD-10-CM | POA: Diagnosis not present

## 2018-05-30 DIAGNOSIS — E042 Nontoxic multinodular goiter: Secondary | ICD-10-CM | POA: Diagnosis not present

## 2018-05-30 DIAGNOSIS — M8580 Other specified disorders of bone density and structure, unspecified site: Secondary | ICD-10-CM | POA: Diagnosis not present

## 2018-05-30 DIAGNOSIS — D72819 Decreased white blood cell count, unspecified: Secondary | ICD-10-CM | POA: Insufficient documentation

## 2018-05-30 DIAGNOSIS — I10 Essential (primary) hypertension: Secondary | ICD-10-CM

## 2018-05-30 DIAGNOSIS — E559 Vitamin D deficiency, unspecified: Secondary | ICD-10-CM | POA: Diagnosis not present

## 2018-05-30 DIAGNOSIS — Z7189 Other specified counseling: Secondary | ICD-10-CM | POA: Diagnosis not present

## 2018-05-30 MED ORDER — LEVOTHYROXINE SODIUM 125 MCG PO TABS: 125.0000 ug | ORAL_TABLET | Freq: Every day | ORAL | 1 refills | Status: DC

## 2018-05-30 NOTE — Assessment & Plan Note (Signed)
New, mild. Will just monitor for now. Has rheum f/u next month.

## 2018-05-30 NOTE — Assessment & Plan Note (Signed)
Followed by Dr Trudie Reed on MTX weekly. Appreciate rheum care. New leukopenia ?MTX related. Will continue to monitor.

## 2018-05-30 NOTE — Assessment & Plan Note (Signed)
Appreciate cards care. Continue eliquis.

## 2018-05-30 NOTE — Assessment & Plan Note (Signed)
Levels returned low - will continue vit D 2000 IU daily.

## 2018-05-30 NOTE — Assessment & Plan Note (Signed)
Chronic, stable. Continue current regimen. 

## 2018-05-30 NOTE — Assessment & Plan Note (Signed)
Update thyroid US. 

## 2018-05-30 NOTE — Assessment & Plan Note (Signed)
TSH remains low but pt largely asymptomatic - will lower levothyroxine dose to 184mcg daily and repeat TFTs in 2 months.

## 2018-05-30 NOTE — Patient Instructions (Addendum)
Schedule eye and dental exams.  Work on Scientist, physiological.  If interested, check with pharmacy about new 2 shot shingles series (shingrix).  Start lower thyroid dose 156mg levothyroxine when you run out of current dose. Return 2 months after starting lower dose for labwork.   Health Maintenance, Female Adopting a healthy lifestyle and getting preventive care can go a long way to promote health and wellness. Talk with your health care provider about what schedule of regular examinations is right for you. This is a good chance for you to check in with your provider about disease prevention and staying healthy. In between checkups, there are plenty of things you can do on your own. Experts have done a lot of research about which lifestyle changes and preventive measures are most likely to keep you healthy. Ask your health care provider for more information. Weight and diet Eat a healthy diet  Be sure to include plenty of vegetables, fruits, low-fat dairy products, and lean protein.  Do not eat a lot of foods high in solid fats, added sugars, or salt.  Get regular exercise. This is one of the most important things you can do for your health. ? Most adults should exercise for at least 150 minutes each week. The exercise should increase your heart rate and make you sweat (moderate-intensity exercise). ? Most adults should also do strengthening exercises at least twice a week. This is in addition to the moderate-intensity exercise.  Maintain a healthy weight  Body mass index (BMI) is a measurement that can be used to identify possible weight problems. It estimates body fat based on height and weight. Your health care provider can help determine your BMI and help you achieve or maintain a healthy weight.  For females 219years of age and older: ? A BMI below 18.5 is considered underweight. ? A BMI of 18.5 to 24.9 is normal. ? A BMI of 25 to 29.9 is considered overweight. ? A BMI of 30 and above  is considered obese.  Watch levels of cholesterol and blood lipids  You should start having your blood tested for lipids and cholesterol at 71years of age, then have this test every 5 years.  You may need to have your cholesterol levels checked more often if: ? Your lipid or cholesterol levels are high. ? You are older than 71years of age. ? You are at high risk for heart disease.  Cancer screening Lung Cancer  Lung cancer screening is recommended for adults [id number] old who are at high risk for lung cancer because of a history of smoking.  A yearly low-dose CT scan of the lungs is recommended for people who: ? Currently smoke. ? Have quit within the past 15 years. ? Have at least a 30-pack-year history of smoking. A pack year is smoking an average of one pack of cigarettes a day for 1 year.  Yearly screening should continue until it has been 15 years since you quit.  Yearly screening should stop if you develop a health problem that would prevent you from having lung cancer treatment.  Breast Cancer  Practice breast self-awareness. This means understanding how your breasts normally appear and feel.  It also means doing regular breast self-exams. Let your health care provider know about any changes, no matter how small.  If you are in your 20s or 30s, you should have a clinical breast exam (CBE) by a health care provider every 1-3 years as part of a regular health  exam.  If you are 40 or older, have a CBE every year. Also consider having a breast X-ray (mammogram) every year.  If you have a family history of breast cancer, talk to your health care provider about genetic screening.  If you are at high risk for breast cancer, talk to your health care provider about having an MRI and a mammogram every year.  Breast cancer gene (BRCA) assessment is recommended for women who have family members with BRCA-related cancers. BRCA-related cancers  include: ? Breast. ? Ovarian. ? Tubal. ? Peritoneal cancers.  Results of the assessment will determine the need for genetic counseling and BRCA1 and BRCA2 testing.  Cervical Cancer Your health care provider may recommend that you be screened regularly for cancer of the pelvic organs (ovaries, uterus, and vagina). This screening involves a pelvic examination, including checking for microscopic changes to the surface of your cervix (Pap test). You may be encouraged to have this screening done every 3 years, beginning at age 55.  For women ages 59-65, health care providers may recommend pelvic exams and Pap testing every 3 years, or they may recommend the Pap and pelvic exam, combined with testing for human papilloma virus (HPV), every 5 years. Some types of HPV increase your risk of cervical cancer. Testing for HPV may also be done on women of any age with unclear Pap test results.  Other health care providers may not recommend any screening for nonpregnant women who are considered low risk for pelvic cancer and who do not have symptoms. Ask your health care provider if a screening pelvic exam is right for you.  If you have had past treatment for cervical cancer or a condition that could lead to cancer, you need Pap tests and screening for cancer for at least 20 years after your treatment. If Pap tests have been discontinued, your risk factors (such as having a new sexual partner) need to be reassessed to determine if screening should resume. Some women have medical problems that increase the chance of getting cervical cancer. In these cases, your health care provider may recommend more frequent screening and Pap tests.  Colorectal Cancer  This type of cancer can be detected and often prevented.  Routine colorectal cancer screening usually begins at 71 years of age and continues through 71 years of age.  Your health care provider may recommend screening at an earlier age if you have risk factors  for colon cancer.  Your health care provider may also recommend using home test kits to check for hidden blood in the stool.  A small camera at the end of a tube can be used to examine your colon directly (sigmoidoscopy or colonoscopy). This is done to check for the earliest forms of colorectal cancer.  Routine screening usually begins at age 75.  Direct examination of the colon should be repeated every 5-10 years through 71 years of age. However, you may need to be screened more often if early forms of precancerous polyps or small growths are found.  Skin Cancer  Check your skin from head to toe regularly.  Tell your health care provider about any new moles or changes in moles, especially if there is a change in a mole's shape or color.  Also tell your health care provider if you have a mole that is larger than the size of a pencil eraser.  Always use sunscreen. Apply sunscreen liberally and repeatedly throughout the day.  Protect yourself by wearing long sleeves, pants, a wide-brimmed  hat, and sunglasses whenever you are outside.  Heart disease, diabetes, and high blood pressure  High blood pressure causes heart disease and increases the risk of stroke. High blood pressure is more likely to develop in: ? People who have blood pressure in the high end of the normal range (130-139/85-89 mm Hg). ? People who are overweight or obese. ? People who are African American.  If you are 77-97 years of age, have your blood pressure checked every 3-5 years. If you are 30 years of age or older, have your blood pressure checked every year. You should have your blood pressure measured twice-once when you are at a hospital or clinic, and once when you are not at a hospital or clinic. Record the average of the two measurements. To check your blood pressure when you are not at a hospital or clinic, you can use: ? An automated blood pressure machine at a pharmacy. ? A home blood pressure monitor.  If  you are between 7 years and 48 years old, ask your health care provider if you should take aspirin to prevent strokes.  Have regular diabetes screenings. This involves taking a blood sample to check your fasting blood sugar level. ? If you are at a normal weight and have a low risk for diabetes, have this test once every three years after 71 years of age. ? If you are overweight and have a high risk for diabetes, consider being tested at a younger age or more often. Preventing infection Hepatitis B  If you have a higher risk for hepatitis B, you should be screened for this virus. You are considered at high risk for hepatitis B if: ? You were born in a country where hepatitis B is common. Ask your health care provider which countries are considered high risk. ? Your parents were born in a high-risk country, and you have not been immunized against hepatitis B (hepatitis B vaccine). ? You have HIV or AIDS. ? You use needles to inject street drugs. ? You live with someone who has hepatitis B. ? You have had sex with someone who has hepatitis B. ? You get hemodialysis treatment. ? You take certain medicines for conditions, including cancer, organ transplantation, and autoimmune conditions.  Hepatitis C  Blood testing is recommended for: ? Everyone born from [birth year] through 1964. ? Anyone with known risk factors for hepatitis C.  Sexually transmitted infections (STIs)  You should be screened for sexually transmitted infections (STIs) including gonorrhea and chlamydia if: ? You are sexually active and are younger than 71 years of age. ? You are older than 71 years of age and your health care provider tells you that you are at risk for this type of infection. ? Your sexual activity has changed since you were last screened and you are at an increased risk for chlamydia or gonorrhea. Ask your health care provider if you are at risk.  If you do not have HIV, but are at risk, it may be recommended  that you take a prescription medicine daily to prevent HIV infection. This is called pre-exposure prophylaxis (PrEP). You are considered at risk if: ? You are sexually active and do not regularly use condoms or know the HIV status of your partner(s). ? You take drugs by injection. ? You are sexually active with a partner who has HIV.  Talk with your health care provider about whether you are at high risk of being infected with HIV. If you choose to begin  PrEP, you should first be tested for HIV. You should then be tested every 3 months for as long as you are taking PrEP. Pregnancy  If you are premenopausal and you may become pregnant, ask your health care provider about preconception counseling.  If you may become pregnant, take 400 to 800 micrograms (mcg) of folic acid every day.  If you want to prevent pregnancy, talk to your health care provider about birth control (contraception). Osteoporosis and menopause  Osteoporosis is a disease in which the bones lose minerals and strength with aging. This can result in serious bone fractures. Your risk for osteoporosis can be identified using a bone density scan.  If you are 22 years of age or older, or if you are at risk for osteoporosis and fractures, ask your health care provider if you should be screened.  Ask your health care provider whether you should take a calcium or vitamin D supplement to lower your risk for osteoporosis.  Menopause may have certain physical symptoms and risks.  Hormone replacement therapy may reduce some of these symptoms and risks. Talk to your health care provider about whether hormone replacement therapy is right for you. Follow these instructions at home:  Schedule regular health, dental, and eye exams.  Stay current with your immunizations.  Do not use any tobacco products including cigarettes, chewing tobacco, or electronic cigarettes.  If you are pregnant, do not drink alcohol.  If you are  breastfeeding, limit how much and how often you drink alcohol.  Limit alcohol intake to no more than 1 drink per day for nonpregnant women. One drink equals 12 ounces of beer, 5 ounces of wine, or 1 ounces of hard liquor.  Do not use street drugs.  Do not share needles.  Ask your health care provider for help if you need support or information about quitting drugs.  Tell your health care provider if you often feel depressed.  Tell your health care provider if you have ever been abused or do not feel safe at home. This information is not intended to replace advice given to you by your health care provider. Make sure you discuss any questions you have with your health care provider. Document Released: 12/22/2010 Document Revised: 11/14/2015 Document Reviewed: 03/12/2015 Elsevier Interactive Patient Education  [employer].

## 2018-05-30 NOTE — Assessment & Plan Note (Signed)
Advanced directives: HCPOA would be daughter.Packet at home, encouraged she work on this.

## 2018-05-30 NOTE — Assessment & Plan Note (Signed)
Encouraged continued calcium/vit D and regular weight bearing exercise. Update DEXA in 2020

## 2018-06-02 ENCOUNTER — Ambulatory Visit
Admission: RE | Admit: 2018-06-02 | Discharge: 2018-06-02 | Disposition: A | Discharge status: Home / Self Care | Payer: Medicare HMO | Source: Ambulatory Visit | Attending: Family Medicine | Admitting: Family Medicine

## 2018-06-02 DIAGNOSIS — E041 Nontoxic single thyroid nodule: Secondary | ICD-10-CM | POA: Diagnosis not present

## 2018-06-02 DIAGNOSIS — E042 Nontoxic multinodular goiter: Secondary | ICD-10-CM

## 2018-06-13 ENCOUNTER — Other Ambulatory Visit: Payer: Self-pay | Admitting: Cardiovascular Disease

## 2018-06-27 DIAGNOSIS — M2550 Pain in unspecified joint: Secondary | ICD-10-CM | POA: Diagnosis not present

## 2018-06-27 DIAGNOSIS — Z1589 Genetic susceptibility to other disease: Secondary | ICD-10-CM | POA: Diagnosis not present

## 2018-06-27 DIAGNOSIS — M0579 Rheumatoid arthritis with rheumatoid factor of multiple sites without organ or systems involvement: Secondary | ICD-10-CM | POA: Diagnosis not present

## 2018-06-27 DIAGNOSIS — Z6828 Body mass index (BMI) 28.0-28.9, adult: Secondary | ICD-10-CM | POA: Diagnosis not present

## 2018-06-27 DIAGNOSIS — E663 Overweight: Secondary | ICD-10-CM | POA: Diagnosis not present

## 2018-06-27 DIAGNOSIS — Z79899 Other long term (current) drug therapy: Secondary | ICD-10-CM | POA: Diagnosis not present

## 2018-06-30 ENCOUNTER — Other Ambulatory Visit: Payer: Self-pay | Admitting: Family Medicine

## 2018-07-04 ENCOUNTER — Other Ambulatory Visit: Payer: Self-pay | Admitting: Cardiovascular Disease

## 2018-08-26 ENCOUNTER — Other Ambulatory Visit: Payer: Self-pay | Admitting: Family Medicine

## 2018-08-26 DIAGNOSIS — Z1231 Encounter for screening mammogram for malignant neoplasm of breast: Secondary | ICD-10-CM

## 2018-09-22 ENCOUNTER — Ambulatory Visit: Payer: Medicare HMO

## 2018-09-26 ENCOUNTER — Other Ambulatory Visit: Payer: Self-pay | Admitting: Cardiovascular Disease

## 2018-09-26 DIAGNOSIS — M0579 Rheumatoid arthritis with rheumatoid factor of multiple sites without organ or systems involvement: Secondary | ICD-10-CM | POA: Diagnosis not present

## 2018-09-26 NOTE — Telephone Encounter (Signed)
Refilled ramipril.

## 2018-10-18 ENCOUNTER — Other Ambulatory Visit: Payer: Self-pay | Admitting: Cardiovascular Disease

## 2018-10-21 ENCOUNTER — Telehealth: Payer: Self-pay | Admitting: Family Medicine

## 2018-10-22 NOTE — Telephone Encounter (Signed)
Pt needs labs for repeat thyroid test per Dr Darnell Level. Nonfasting. Please schedule. Thanks.

## 2018-10-25 NOTE — Telephone Encounter (Signed)
Pt scheduled for 10/27/18 @ 9:10am

## 2018-10-27 ENCOUNTER — Other Ambulatory Visit: Payer: Self-pay

## 2018-10-27 ENCOUNTER — Other Ambulatory Visit (INDEPENDENT_AMBULATORY_CARE_PROVIDER_SITE_OTHER): Payer: Medicare HMO

## 2018-10-27 DIAGNOSIS — E039 Hypothyroidism, unspecified: Secondary | ICD-10-CM

## 2018-10-27 LAB — TSH: TSH: 1.41 u[IU]/mL (ref 0.35–4.50)

## 2018-10-27 LAB — T4, FREE: Free T4: 1.29 ng/dL (ref 0.60–1.60)

## 2018-11-01 ENCOUNTER — Other Ambulatory Visit: Payer: Self-pay

## 2018-11-01 MED ORDER — LEVOTHYROXINE SODIUM 125 MCG PO TABS: ORAL_TABLET | ORAL | 2 refills | Status: DC

## 2018-11-01 NOTE — Telephone Encounter (Signed)
E-scribed refill to [facility] order per pt request.

## 2018-11-07 ENCOUNTER — Other Ambulatory Visit: Payer: Self-pay | Admitting: Cardiovascular Disease

## 2018-11-07 ENCOUNTER — Ambulatory Visit: Payer: Medicare HMO

## 2018-11-17 ENCOUNTER — Other Ambulatory Visit: Payer: Self-pay | Admitting: Cardiovascular Disease

## 2018-11-22 ENCOUNTER — Ambulatory Visit: Payer: Medicare HMO | Admitting: Cardiovascular Disease

## 2018-11-24 ENCOUNTER — Other Ambulatory Visit: Payer: Self-pay | Admitting: Cardiovascular Disease

## 2018-12-19 ENCOUNTER — Ambulatory Visit
Admission: RE | Admit: 2018-12-19 | Discharge: 2018-12-19 | Disposition: A | Discharge status: Home / Self Care | Payer: Medicare HMO | Source: Ambulatory Visit | Attending: Family Medicine | Admitting: Family Medicine

## 2018-12-19 ENCOUNTER — Other Ambulatory Visit: Payer: Self-pay

## 2018-12-19 DIAGNOSIS — Z1231 Encounter for screening mammogram for malignant neoplasm of breast: Secondary | ICD-10-CM

## 2018-12-19 LAB — HM MAMMOGRAPHY

## 2018-12-21 ENCOUNTER — Encounter: Payer: Self-pay | Admitting: Family Medicine

## 2018-12-26 NOTE — Progress Notes (Signed)
I reviewed health advisor's note, was available for consultation, and agree with documentation and plan.  

## 2019-01-02 DIAGNOSIS — Z6830 Body mass index (BMI) 30.0-30.9, adult: Secondary | ICD-10-CM | POA: Diagnosis not present

## 2019-01-02 DIAGNOSIS — Z79899 Other long term (current) drug therapy: Secondary | ICD-10-CM | POA: Diagnosis not present

## 2019-01-02 DIAGNOSIS — M2550 Pain in unspecified joint: Secondary | ICD-10-CM | POA: Diagnosis not present

## 2019-01-02 DIAGNOSIS — M0579 Rheumatoid arthritis with rheumatoid factor of multiple sites without organ or systems involvement: Secondary | ICD-10-CM | POA: Diagnosis not present

## 2019-01-02 DIAGNOSIS — Z1589 Genetic susceptibility to other disease: Secondary | ICD-10-CM | POA: Diagnosis not present

## 2019-01-02 DIAGNOSIS — E669 Obesity, unspecified: Secondary | ICD-10-CM | POA: Diagnosis not present

## 2019-01-13 ENCOUNTER — Other Ambulatory Visit: Payer: Self-pay | Admitting: Cardiovascular Disease

## 2019-01-16 NOTE — Telephone Encounter (Signed)
Rx request sent to pharmacy.  

## 2019-01-20 ENCOUNTER — Other Ambulatory Visit: Payer: Self-pay

## 2019-01-20 ENCOUNTER — Ambulatory Visit (INDEPENDENT_AMBULATORY_CARE_PROVIDER_SITE_OTHER): Payer: Medicare HMO | Admitting: Family Medicine

## 2019-01-20 ENCOUNTER — Encounter: Payer: Self-pay | Admitting: Family Medicine

## 2019-01-20 VITALS — Temp 97.3°F | Ht 63.5 in | Wt 179.0 lb

## 2019-01-20 DIAGNOSIS — I4819 Other persistent atrial fibrillation: Secondary | ICD-10-CM | POA: Diagnosis not present

## 2019-01-20 DIAGNOSIS — U071 COVID-19: Secondary | ICD-10-CM

## 2019-01-20 DIAGNOSIS — R6889 Other general symptoms and signs: Secondary | ICD-10-CM | POA: Diagnosis not present

## 2019-01-20 DIAGNOSIS — M0579 Rheumatoid arthritis with rheumatoid factor of multiple sites without organ or systems involvement: Secondary | ICD-10-CM

## 2019-01-20 DIAGNOSIS — R05 Cough: Secondary | ICD-10-CM

## 2019-01-20 DIAGNOSIS — R059 Cough, unspecified: Secondary | ICD-10-CM

## 2019-01-20 DIAGNOSIS — Z20822 Contact with and (suspected) exposure to covid-19: Secondary | ICD-10-CM

## 2019-01-20 HISTORY — DX: COVID-19: U07.1

## 2019-01-20 MED ORDER — GUAIFENESIN-CODEINE 100-10 MG/5ML PO SYRP: 5.0000 mL | ORAL_SOLUTION | Freq: Two times a day (BID) | ORAL | 0 refills | Status: DC | PRN

## 2019-01-20 NOTE — Progress Notes (Signed)
Virtual visit completed through [employer]. Due to national recommendations of social distancing due to COVID-19, a virtual visit is felt to be most appropriate for this patient at this time. Reviewed limitations of a virtual visit.   Patient location: home Provider location:  at [facility], office If any vitals were documented, they were collected by patient at home unless specified below.    Temp (!) 97.3 F (36.3 C)   Ht 5' 3.5" (1.613 m)   Wt 179 lb (81.2 kg)   BMI 31.21 kg/m    CC: cough, fatigue Subjective:    Patient ID: Joanna Hood, female    DOB: 1945/11/30, 73 y.o.   MRN: [id number]  HPI: Joanna Hood is a 73 y.o. female presenting on 01/20/2019 for Cough (C/o dry cough, fatigue and a little SOB.  Also had a fever, 100.1.  Sxs started about 1 wk ago. )   1 wk h/o fatigue, dry cough. No fever, Tmax 100.1. Mild dyspnea. Symptoms worse at night time "I feel accumulation of fluid in lungs". Appetite has decreased. Initial body aches, now better. Mild diarrhea now better. Dyspnea may be improving.   Denies HA, ST, congestion, no loss of taste or smell. No myalgias, abd pain, or nausea.  No sick contacts at home.  She has been staying at home and wearing mask when going out.  Has tried nothing for these symptoms.   H/o PNA 2017.  RA on MTX.       Relevant past medical, surgical, family and social history reviewed and updated as indicated. Interim medical history since our last visit reviewed. Allergies and medications reviewed and updated. Outpatient Medications Prior to Visit  Medication Sig Dispense Refill  . amLODipine (NORVASC) 10 MG tablet TAKE 1 TABLET EVERY DAY 90 tablet 3  . Calcium Carb-Cholecalciferol (CALCIUM-VITAMIN D) 600-400 MG-UNIT TABS Take 1 tablet by mouth daily.    . cholecalciferol (VITAMIN D) 1000 UNITS tablet Take 1,000 Units by mouth daily.    Marland Kitchen ELIQUIS 5 MG TABS tablet TAKE 1 TABLET TWICE DAILY 180 tablet 1  . furosemide  (LASIX) 20 MG tablet Take 1 tablet (20 mg total) by mouth daily. 90 tablet 0  . levothyroxine (SYNTHROID) 125 MCG tablet TAKE 1 TABLET EVERY DAY BEFORE BREAKFAST 90 tablet 2  . meclizine (ANTIVERT) 25 MG tablet Take 1 tablet (25 mg total) by mouth 3 (three) times daily as needed. For vertigo 90 tablet 0  . methotrexate (RHEUMATREX) 2.5 MG tablet Take 15 mg by mouth once a week. Caution:Chemotherapy. Protect from light.    . metoprolol tartrate (LOPRESSOR) 25 MG tablet Take 1 tablet (25 mg total) by mouth 2 (two) times daily. 180 tablet 0  . pantoprazole (PROTONIX) 40 MG tablet TAKE 1 TABLET EVERY MONDAY, WEDNESDAY AND FRIDAY. 36 tablet 3  . potassium chloride SA (K-DUR) 20 MEQ tablet TAKE 1 TABLET EVERY DAY 90 tablet 2  . ramipril (ALTACE) 10 MG capsule TAKE 1 CAPSULE TWICE DAILY 180 capsule 3   No facility-administered medications prior to visit.      Per HPI unless specifically indicated in ROS section below Review of Systems Objective:    Temp (!) 97.3 F (36.3 C)   Ht 5' 3.5" (1.613 m)   Wt 179 lb (81.2 kg)   BMI 31.21 kg/m   Wt Readings from Last 3 Encounters:  01/20/19 179 lb (81.2 kg)  05/30/18 165 lb 8 oz (75.1 kg)  05/26/18 162 lb (73.5 kg)  Physical exam: Gen: alert, NAD, not ill appearing Pulm: speaks in complete sentences without increased work of breathing Psych: normal mood, normal thought content      Results for orders placed or performed in visit on 12/21/18  HM MAMMOGRAPHY  Result Value Ref Range   HM Mammogram 0-4 Bi-Rad 0-4 Bi-Rad, Self Reported Normal   Assessment & Plan:   Problem List Items Addressed This Visit    Rheumatoid arthritis ([place])    Increased risk given MTX use.       Persistent atrial fibrillation    Continues eliquis.       Cough - Primary    Given symptoms in setting of covid pandemic, did recommend testing. Symptoms not consistent with PNA (no significant fever, no significant sputum production).  Rx codeine cough syrup PRN.   Reviewed symptoms of Covid. Supportive care reviewed. Red flags to seek urgent care over weekend reviewed. Pt agrees with plan.  Will call Monday for an update.       Relevant Orders   Novel Coronavirus, NAA ([facility])       Meds ordered this encounter  Medications  . guaiFENesin-codeine (CHERATUSSIN AC) 100-10 MG/5ML syrup    Sig: Take 5 mLs by mouth 2 (two) times daily as needed for cough (sedation precautions).    Dispense:  120 mL    Refill:  0   Orders Placed This Encounter  Procedures  . Novel Coronavirus, NAA ([facility])    Order Specific Question:   Known Exposure    Answer:   No    I discussed the assessment and treatment plan with the patient. The patient was provided an opportunity to ask questions and all were answered. The patient agreed with the plan and demonstrated an understanding of the instructions. The patient was advised to call back or seek an in-person evaluation if the symptoms worsen or if the condition fails to improve as anticipated.  Follow up plan: No follow-ups on file.  Ria Bush, MD

## 2019-01-20 NOTE — Assessment & Plan Note (Signed)
Increased risk given MTX use.

## 2019-01-20 NOTE — Assessment & Plan Note (Signed)
Given symptoms in setting of covid pandemic, did recommend testing. Symptoms not consistent with PNA (no significant fever, no significant sputum production).  Rx codeine cough syrup PRN.  Reviewed symptoms of Covid. Supportive care reviewed. Red flags to seek urgent care over weekend reviewed. Pt agrees with plan.  Will call Monday for an update.

## 2019-01-20 NOTE — Assessment & Plan Note (Signed)
Continues eliquis.  

## 2019-01-22 ENCOUNTER — Telehealth: Payer: Self-pay | Admitting: Family Medicine

## 2019-01-22 LAB — NOVEL CORONAVIRUS, NAA: SARS-CoV-2, NAA: DETECTED — AB

## 2019-01-22 NOTE — Telephone Encounter (Signed)
I was notified by [facility] that her recent test for the Covid-19 virus was positive. I spoke to her today and she feels fairly well other than a dry cough. No fever or chest pain or SOB. She began having symptoms about a week ago. She is drinking fluids and using Charatussin AC as needed. She has not travelled recently btu she notes that her pastor and several memebers of her church have tested positive for the virus. I advised her to self quarantine for a total of 14 days from the start of her symptoms, and she agreed. She will follow up as needed.

## 2019-01-31 ENCOUNTER — Telehealth: Payer: Self-pay

## 2019-01-31 NOTE — Telephone Encounter (Signed)
Pt left v/m that she tested + for covid on 01/22/19 and pt has been on quarantine and wants to know if Dr Darnell Level wants pt retested. Pt request cb.

## 2019-02-01 NOTE — Telephone Encounter (Addendum)
Left message on vm per dpr relaying Dr. Synthia Innocent message.  Form faxed to [employer].

## 2019-02-01 NOTE — Telephone Encounter (Signed)
We were supposed to call patient last Wednesday for an update but I don't see it was done. Did we fill out health department form? plz call - as long as improving no need for retesting.  Quarantine can end 10 days from symptom onset, with fever free for 3 days off tylenol/ibuprofen and with improved respiratory symptoms. She should have already met these criteria to end quarantine as long as feeling better.

## 2019-02-02 NOTE — Telephone Encounter (Signed)
Spoke with pt asking how she was feeling.  Says she got my vm and is feeling fine.  Says she has not had a fever for some awhile, no respiratory sxs and her energy is a lot better.

## 2019-02-07 ENCOUNTER — Encounter: Payer: Self-pay | Admitting: Cardiovascular Disease

## 2019-02-07 ENCOUNTER — Ambulatory Visit (INDEPENDENT_AMBULATORY_CARE_PROVIDER_SITE_OTHER): Payer: Medicare HMO | Admitting: Cardiovascular Disease

## 2019-02-07 ENCOUNTER — Other Ambulatory Visit: Payer: Self-pay

## 2019-02-07 DIAGNOSIS — I10 Essential (primary) hypertension: Secondary | ICD-10-CM

## 2019-02-07 DIAGNOSIS — I340 Nonrheumatic mitral (valve) insufficiency: Secondary | ICD-10-CM | POA: Diagnosis not present

## 2019-02-07 DIAGNOSIS — I4819 Other persistent atrial fibrillation: Secondary | ICD-10-CM

## 2019-02-07 NOTE — Assessment & Plan Note (Signed)
History of essential hypertension with blood pressure measured today at 179/78.  She is on amlodipine metoprolol and ramipril.  We will recheck her blood pressure today.

## 2019-02-07 NOTE — Patient Instructions (Signed)
Medication Instructions:  Your physician recommends that you continue on your current medications as directed. Please refer to the Current Medication list given to you today.  If you need a refill on your cardiac medications before your next appointment, please call your pharmacy.   Lab work: NONE If you have labs (blood work) drawn today and your tests are completely normal, you will receive your results only by: Marland Kitchen MyChart Message (if you have MyChart) OR . A paper copy in the mail If you have any lab test that is abnormal or we need to change your treatment, we will call you to review the results.  Testing/Procedures: Your physician has requested that you have an echocardiogram. Echocardiography is a painless test that uses sound waves to create images of your heart. It provides your doctor with information about the size and shape of your heart and how well your heart's chambers and valves are working. This procedure takes approximately one hour. There are no restrictions for this procedure. LOCATION: [facility]: [address], [place], [place] [address]   Follow-Up: At [facility], you and your health needs are our priority.  As part of our continuing mission to provide you with exceptional heart care, we have created designated Provider Care Teams.  These Care Teams include your primary Cardiologist (physician) and Advanced Practice Providers (APPs -  Physician Assistants and Nurse Practitioners) who all work together to provide you with the care you need, when you need it. . You will need a follow up appointment in 12 months with Dr. Quay Burow.  Please call our office 2 months in advance to schedule this/each appointment.

## 2019-02-07 NOTE — Progress Notes (Signed)
02/07/2019 Joanna Hood   05-07-1946  [id number]  Primary Physician Ria Bush, MD Primary Cardiologist: Lorretta Harp MD [name], [place]  HPI:  Joanna Hood is a 73 y.o.  moderately overweight African-American female who I initially met several years ago.I last saw her in the office  11/19/2017.Marland KitchenWe first [place] she was admitted to [facility] with diastolic heart failure, hypertension and atrial fibrillation. She ultimately underwent cardiac catheterization revealing essentially normal coronary arteries and normal LV function by Dr. Ellyn Hack. Her A. Fib converted to sinus rhythm with the aid of IV Cardizem. Her blood pressure was better controlled. She saw Ellen Henri PA-C in the office and again was in atrial fibrillation at which time her beta blocker was adjusted and Eliquis was added because of increased CHA2DSVASC2 score of 2. Since I saw her in the office she remained clinically stable. She denies chest pain or shortness of breath.  Since I saw her a year ago she is done well.  She remains in A. fib on Eliquis oral anticoagulation.  She did contract COVID-19 in late July and completed her quarantine a week ago.  She had mild flulike symptoms.  She did have a 2D echo performed 05/08/2015 that showed normal LV function with moderate mitral regurgitation and severe left atrial enlargement.  She denies chest pain or shortness of breath.  Current Meds  Medication Sig  . amLODipine (NORVASC) 10 MG tablet TAKE 1 TABLET EVERY DAY  . Calcium Carb-Cholecalciferol (CALCIUM-VITAMIN D) 600-400 MG-UNIT TABS Take 1 tablet by mouth daily.  . cholecalciferol (VITAMIN D) 1000 UNITS tablet Take 1,000 Units by mouth daily.  Marland Kitchen ELIQUIS 5 MG TABS tablet TAKE 1 TABLET TWICE DAILY  . furosemide (LASIX) 20 MG tablet Take 1 tablet (20 mg total) by mouth daily.  Marland Kitchen guaiFENesin-codeine (CHERATUSSIN AC) 100-10 MG/5ML syrup Take 5 mLs by mouth 2 (two) times daily as  needed for cough (sedation precautions).  Marland Kitchen levothyroxine (SYNTHROID) 125 MCG tablet TAKE 1 TABLET EVERY DAY BEFORE BREAKFAST  . meclizine (ANTIVERT) 25 MG tablet Take 1 tablet (25 mg total) by mouth 3 (three) times daily as needed. For vertigo  . methotrexate (RHEUMATREX) 2.5 MG tablet Take 15 mg by mouth once a week. Caution:Chemotherapy. Protect from light.  . metoprolol tartrate (LOPRESSOR) 25 MG tablet Take 1 tablet (25 mg total) by mouth 2 (two) times daily.  . pantoprazole (PROTONIX) 40 MG tablet TAKE 1 TABLET EVERY MONDAY, WEDNESDAY AND FRIDAY.  Marland Kitchen potassium chloride SA (K-DUR) 20 MEQ tablet TAKE 1 TABLET EVERY DAY  . ramipril (ALTACE) 10 MG capsule TAKE 1 CAPSULE TWICE DAILY     Allergies  Allergen Reactions  . Metronidazole     REACTION: Rash, itching    Social History   Socioeconomic History  . Marital status: Single    Spouse name: Not on file  . Number of children: 1  . Years of education: Not on file  . Highest education level: Not on file  Occupational History  . Occupation: Retired  Scientific laboratory technician  . Financial resource strain: Not on file  . Food insecurity    Worry: Not on file    Inability: Not on file  . Transportation needs    Medical: Not on file    Non-medical: Not on file  Tobacco Use  . Smoking status: Former Smoker    Quit date: 05/16/1969    Years since quitting: 49.7  . Smokeless tobacco: Former Network engineer  and Sexual Activity  . Alcohol use: No    Alcohol/week: 0.0 standard drinks  . Drug use: No  . Sexual activity: Never  Lifestyle  . Physical activity    Days per week: Not on file    Minutes per session: Not on file  . Stress: Not on file  Relationships  . Social Herbalist on phone: Not on file    Gets together: Not on file    Attends religious service: Not on file    Active member of club or organization: Not on file    Attends meetings of clubs or organizations: Not on file    Relationship status: Not on file  .  Intimate partner violence    Fear of current or ex partner: Not on file    Emotionally abused: Not on file    Physically abused: Not on file    Forced sexual activity: Not on file  Other Topics Concern  . 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  Cardiovascular: negative for chest pain, dyspnea on exertion, edema, orthopnea, palpitations, paroxysmal nocturnal dyspnea or shortness of breath Dermatological: negative for rash Respiratory: negative for cough or wheezing Urologic: negative for hematuria Abdominal: negative for nausea, vomiting, diarrhea, bright red blood per rectum, melena, or hematemesis Neurologic: negative for visual changes, syncope, or dizziness All other systems reviewed and are otherwise negative except as noted above.    Blood pressure (!) 179/78, pulse 70, height _0  (1.651 m), weight 176 lb 9.6 oz (80.1 kg), SpO2 94 %.  General appearance: alert and no distress Neck: no adenopathy, no carotid bruit, no JVD, supple, symmetrical, trachea midline and thyroid not enlarged, symmetric, no tenderness/mass/nodules Lungs: clear to auscultation bilaterally Heart: irregularly irregular rhythm Extremities: extremities normal, atraumatic, no cyanosis or edema Pulses: 2+ and symmetric Skin: Skin color, texture, turgor normal. No rashes or lesions Neurologic: Alert and oriented X 3, normal strength and tone. Normal symmetric reflexes. Normal coordination and gait  EKG atrial fibrillation with a ventricular response of 70 and nonspecific ST and T wave changes with poor R wave progression.  I personally reviewed this EKG.  ASSESSMENT AND PLAN:   Persistent atrial fibrillation History of persistent atrial fibrillation rate controlled on Eliquis oral  anticoagulation.  HTN (hypertension), malignant History of essential hypertension with blood pressure measured today at 179/78.  She is on amlodipine metoprolol and ramipril.  We will recheck her blood pressure today.  Mitral valve regurgitation History of moderate mitral regurgitation with normal LV systolic function last performed 05/08/2015.  We will recheck a 2D echocardiogram.      Lorretta Harp MD [facility], [facility] 02/07/2019 10:23 AM

## 2019-02-07 NOTE — Assessment & Plan Note (Signed)
History of persistent atrial fibrillation rate controlled on Eliquis oral anticoagulation. 

## 2019-02-07 NOTE — Assessment & Plan Note (Signed)
History of moderate mitral regurgitation with normal LV systolic function last performed 05/08/2015.  We will recheck a 2D echocardiogram.

## 2019-02-14 ENCOUNTER — Ambulatory Visit (HOSPITAL_COMMUNITY): Payer: Medicare HMO | Attending: Cardiology

## 2019-02-14 ENCOUNTER — Other Ambulatory Visit: Payer: Self-pay

## 2019-02-14 ENCOUNTER — Other Ambulatory Visit: Payer: Self-pay | Admitting: *Deleted

## 2019-02-14 DIAGNOSIS — I340 Nonrheumatic mitral (valve) insufficiency: Secondary | ICD-10-CM | POA: Insufficient documentation

## 2019-04-04 DIAGNOSIS — M0579 Rheumatoid arthritis with rheumatoid factor of multiple sites without organ or systems involvement: Secondary | ICD-10-CM | POA: Diagnosis not present

## 2019-04-10 ENCOUNTER — Telehealth: Payer: Self-pay | Admitting: *Deleted

## 2019-04-10 MED ORDER — BENZONATATE 100 MG PO CAPS: 100.0000 mg | ORAL_CAPSULE | Freq: Three times a day (TID) | ORAL | 0 refills | Status: DC | PRN

## 2019-04-10 NOTE — Telephone Encounter (Signed)
Spoke with pt relaying Dr. Synthia Innocent message.  Pt verbalizes understanding and confirms the cough is separate from COVID illness.

## 2019-04-10 NOTE — Telephone Encounter (Signed)
Joanna Hood called stating that she started with a dry cough about 3 weeks ago. Joanna Hood denies any other symptoms. Joanna Hood stated that she did have covid several months ago. Joanna Hood stated that she has taken Mucinex and another prescription cough medication. Joanna Hood wants to know what Dr. Danise Mina recommends for her cough? Pharmacy [facility]/[place]

## 2019-04-10 NOTE — Telephone Encounter (Signed)
This cough is separate from her covid illness (01/2019) right?  She could try OTC cough medicine like delsym or robitussin.  I have also sent in tessalon perls for her to try (swallow, don't chew).  Let us know if not improving with these measures.

## 2019-05-04 ENCOUNTER — Other Ambulatory Visit: Payer: Self-pay | Admitting: Cardiovascular Disease

## 2019-05-11 ENCOUNTER — Other Ambulatory Visit: Payer: Self-pay | Admitting: Cardiovascular Disease

## 2019-05-17 DIAGNOSIS — H2513 Age-related nuclear cataract, bilateral: Secondary | ICD-10-CM | POA: Diagnosis not present

## 2019-05-20 DIAGNOSIS — H524 Presbyopia: Secondary | ICD-10-CM | POA: Diagnosis not present

## 2019-05-20 DIAGNOSIS — H52223 Regular astigmatism, bilateral: Secondary | ICD-10-CM | POA: Diagnosis not present

## 2019-06-01 ENCOUNTER — Other Ambulatory Visit: Payer: Self-pay | Admitting: Family Medicine

## 2019-06-01 DIAGNOSIS — D509 Iron deficiency anemia, unspecified: Secondary | ICD-10-CM

## 2019-06-01 DIAGNOSIS — M0579 Rheumatoid arthritis with rheumatoid factor of multiple sites without organ or systems involvement: Secondary | ICD-10-CM

## 2019-06-01 DIAGNOSIS — E559 Vitamin D deficiency, unspecified: Secondary | ICD-10-CM

## 2019-06-01 DIAGNOSIS — I10 Essential (primary) hypertension: Secondary | ICD-10-CM

## 2019-06-01 DIAGNOSIS — E786 Lipoprotein deficiency: Secondary | ICD-10-CM

## 2019-06-01 DIAGNOSIS — E039 Hypothyroidism, unspecified: Secondary | ICD-10-CM

## 2019-06-02 ENCOUNTER — Ambulatory Visit (INDEPENDENT_AMBULATORY_CARE_PROVIDER_SITE_OTHER): Payer: Medicare HMO

## 2019-06-02 ENCOUNTER — Ambulatory Visit: Payer: Medicare HMO

## 2019-06-02 ENCOUNTER — Other Ambulatory Visit (INDEPENDENT_AMBULATORY_CARE_PROVIDER_SITE_OTHER): Payer: Medicare HMO

## 2019-06-02 ENCOUNTER — Other Ambulatory Visit: Payer: Self-pay

## 2019-06-02 DIAGNOSIS — E559 Vitamin D deficiency, unspecified: Secondary | ICD-10-CM

## 2019-06-02 DIAGNOSIS — D509 Iron deficiency anemia, unspecified: Secondary | ICD-10-CM | POA: Diagnosis not present

## 2019-06-02 DIAGNOSIS — E786 Lipoprotein deficiency: Secondary | ICD-10-CM

## 2019-06-02 DIAGNOSIS — E039 Hypothyroidism, unspecified: Secondary | ICD-10-CM | POA: Diagnosis not present

## 2019-06-02 DIAGNOSIS — M0579 Rheumatoid arthritis with rheumatoid factor of multiple sites without organ or systems involvement: Secondary | ICD-10-CM | POA: Diagnosis not present

## 2019-06-02 DIAGNOSIS — Z0000 Encounter for general adult medical examination without abnormal findings: Secondary | ICD-10-CM | POA: Diagnosis not present

## 2019-06-02 DIAGNOSIS — I10 Essential (primary) hypertension: Secondary | ICD-10-CM

## 2019-06-02 LAB — CBC WITH DIFFERENTIAL/PLATELET
Basophils Absolute: 0.1 10*3/uL (ref 0.0–0.1)
Basophils Relative: 1.5 % (ref 0.0–3.0)
Eosinophils Absolute: 0.1 10*3/uL (ref 0.0–0.7)
Eosinophils Relative: 2.4 % (ref 0.0–5.0)
HCT: 37.2 % (ref 36.0–46.0)
Hemoglobin: 11.8 g/dL — ABNORMAL LOW (ref 12.0–15.0)
Lymphocytes Relative: 22.5 % (ref 12.0–46.0)
Lymphs Abs: 1 10*3/uL (ref 0.7–4.0)
MCHC: 31.7 g/dL (ref 30.0–36.0)
MCV: 82.1 fl (ref 78.0–100.0)
Monocytes Absolute: 0.6 10*3/uL (ref 0.1–1.0)
Monocytes Relative: 12.8 % — ABNORMAL HIGH (ref 3.0–12.0)
Neutro Abs: 2.7 10*3/uL (ref 1.4–7.7)
Neutrophils Relative %: 60.8 % (ref 43.0–77.0)
Platelets: 181 10*3/uL (ref 150.0–400.0)
RBC: 4.53 Mil/uL (ref 3.87–5.11)
RDW: 18.2 % — ABNORMAL HIGH (ref 11.5–15.5)
WBC: 4.4 10*3/uL (ref 4.0–10.5)

## 2019-06-02 LAB — LIPID PANEL
Cholesterol: 182 mg/dL (ref 0–200)
HDL: 55.4 mg/dL (ref >39.00)
LDL Cholesterol: 113 mg/dL — ABNORMAL HIGH (ref 0–99)
NonHDL: 126.71
Total CHOL/HDL Ratio: 3
Triglycerides: 69 mg/dL (ref 0.0–149.0)
VLDL: 13.8 mg/dL (ref 0.0–40.0)

## 2019-06-02 LAB — MICROALBUMIN / CREATININE URINE RATIO
Creatinine,U: 113.3 mg/dL
Microalb Creat Ratio: 13.4 mg/g (ref 0.0–30.0)
Microalb, Ur: 15.2 mg/dL — ABNORMAL HIGH (ref 0.0–1.9)

## 2019-06-02 LAB — COMPREHENSIVE METABOLIC PANEL
ALT: 39 U/L — ABNORMAL HIGH (ref 0–35)
AST: 25 U/L (ref 0–37)
Albumin: 4.1 g/dL (ref 3.5–5.2)
Alkaline Phosphatase: 97 U/L (ref 39–117)
BUN: 13 mg/dL (ref 6–23)
CO2: 27 mEq/L (ref 19–32)
Calcium: 9.3 mg/dL (ref 8.4–10.5)
Chloride: 104 mEq/L (ref 96–112)
Creatinine, Ser: 0.83 mg/dL (ref 0.40–1.20)
GFR: 81.76 mL/min (ref >60.00)
Glucose, Bld: 103 mg/dL — ABNORMAL HIGH (ref 70–99)
Potassium: 3.6 mEq/L (ref 3.5–5.1)
Sodium: 139 mEq/L (ref 135–145)
Total Bilirubin: 0.9 mg/dL (ref 0.2–1.2)
Total Protein: 7.6 g/dL (ref 6.0–8.3)

## 2019-06-02 LAB — TSH: TSH: 5.03 u[IU]/mL — ABNORMAL HIGH (ref 0.35–4.50)

## 2019-06-02 LAB — VITAMIN D 25 HYDROXY (VIT D DEFICIENCY, FRACTURES): VITD: 30.25 ng/mL (ref 30.00–100.00)

## 2019-06-02 NOTE — Progress Notes (Signed)
PCP notes:  Health Maintenance: No gaps in care   Abnormal Screenings: none   Patient concerns: none   Nurse concerns: none   Next PCP appt.: 06/05/2019 @ 8:30 am

## 2019-06-02 NOTE — Addendum Note (Signed)
Addended by: Cloyd Stagers on: 06/02/2019 08:04 AM   Modules accepted: Orders

## 2019-06-02 NOTE — Patient Instructions (Signed)
[name] , Thank you for taking time to come for your Medicare Wellness Visit. I appreciate your ongoing commitment to your health goals. Please review the following plan we discussed and let me know if I can assist you in the future.   Screening recommendations/referrals: Colonoscopy: Up to date, completed 11/22/2015 Mammogram: Up to date, completed 12/19/2018 Bone Density: Up to date, completed 12/26/2013 Recommended yearly ophthalmology/optometry visit for glaucoma screening and checkup Recommended yearly dental visit for hygiene and checkup  Vaccinations: Influenza vaccine: Up to date, completed 04/04/2019 Pneumococcal vaccine: Completed series Tdap vaccine: Up to date, completed 04/23/2016 Shingles vaccine: Discussed Shingrix    Advanced directives: Advance directive discussed with you today. Even though you declined this today please call our office should you change your mind and we can give you the proper paperwork for you to fill out.  Conditions/risks identified: hypertension  Next appointment: 06/05/2019 @ 8:30 am    Preventive Care 65 Years and Older, Female Preventive care refers to lifestyle choices and visits with your health care provider that can promote health and wellness. What does preventive care include?  A yearly physical exam. This is also called an annual well check.  Dental exams once or twice a year.  Routine eye exams. Ask your health care provider how often you should have your eyes checked.  Personal lifestyle choices, including:  Daily care of your teeth and gums.  Regular physical activity.  Eating a healthy diet.  Avoiding tobacco and drug use.  Limiting alcohol use.  Practicing safe sex.  Taking low-dose aspirin every day.  Taking vitamin and mineral supplements as recommended by your health care provider. What happens during an annual well check? The services and screenings done by your health care provider during your annual well  check will depend on your age, overall health, lifestyle risk factors, and family history of disease. Counseling  Your health care provider may ask you questions about your:  Alcohol use.  Tobacco use.  Drug use.  Emotional well-being.  Home and relationship well-being.  Sexual activity.  Eating habits.  History of falls.  Memory and ability to understand (cognition).  Work and work Statistician.  Reproductive health. Screening  You may have the following tests or measurements:  Height, weight, and BMI.  Blood pressure.  Lipid and cholesterol levels. These may be checked every 5 years, or more frequently if you are over 6 years old.  Skin check.  Lung cancer screening. You may have this screening every year starting at age 25 if you have a 30-pack-year history of smoking and currently smoke or have quit within the past 15 years.  Fecal occult blood test (FOBT) of the stool. You may have this test every year starting at age 79.  Flexible sigmoidoscopy or colonoscopy. You may have a sigmoidoscopy every 5 years or a colonoscopy every 10 years starting at age 64.  Hepatitis C blood test.  Hepatitis B blood test.  Sexually transmitted disease (STD) testing.  Diabetes screening. This is done by checking your blood sugar (glucose) after you have not eaten for a while (fasting). You may have this done every 1-3 years.  Bone density scan. This is done to screen for osteoporosis. You may have this done starting at age 57.  Mammogram. This may be done every 1-2 years. Talk to your health care provider about how often you should have regular mammograms. Talk with your health care provider about your test results, treatment options, and if necessary, the  need for more tests. Vaccines  Your health care provider may recommend certain vaccines, such as:  Influenza vaccine. This is recommended every year.  Tetanus, diphtheria, and acellular pertussis (Tdap, Td) vaccine. You  may need a Td booster every 10 years.  Zoster vaccine. You may need this after age 79.  Pneumococcal 13-valent conjugate (PCV13) vaccine. One dose is recommended after age 29.  Pneumococcal polysaccharide (PPSV23) vaccine. One dose is recommended after age 84. Talk to your health care provider about which screenings and vaccines you need and how often you need them. This information is not intended to replace advice given to you by your health care provider. Make sure you discuss any questions you have with your health care provider. Document Released: 07/05/2015 Document Revised: 02/26/2016 Document Reviewed: 04/09/2015 Elsevier Interactive Patient Education  2017 [place] Prevention in the Home Falls can cause injuries. They can happen to people of all ages. There are many things you can do to make your home safe and to help prevent falls. What can I do on the outside of my home?  Regularly fix the edges of walkways and driveways and fix any cracks.  Remove anything that might make you trip as you walk through a door, such as a raised step or threshold.  Trim any bushes or trees on the path to your home.  Use bright outdoor lighting.  Clear any walking paths of anything that might make someone trip, such as rocks or tools.  Regularly check to see if handrails are loose or broken. Make sure that both sides of any steps have handrails.  Any raised decks and porches should have guardrails on the edges.  Have any leaves, snow, or ice cleared regularly.  Use sand or salt on walking paths during winter.  Clean up any spills in your garage right away. This includes oil or grease spills. What can I do in the bathroom?  Use night lights.  Install grab bars by the toilet and in the tub and shower. Do not use towel bars as grab bars.  Use non-skid mats or decals in the tub or shower.  If you need to sit down in the shower, use a plastic, non-slip stool.  Keep the floor  dry. Clean up any water that spills on the floor as soon as it happens.  Remove soap buildup in the tub or shower regularly.  Attach bath mats securely with double-sided non-slip rug tape.  Do not have throw rugs and other things on the floor that can make you trip. What can I do in the bedroom?  Use night lights.  Make sure that you have a light by your bed that is easy to reach.  Do not use any sheets or blankets that are too big for your bed. They should not hang down onto the floor.  Have a firm chair that has side arms. You can use this for support while you get dressed.  Do not have throw rugs and other things on the floor that can make you trip. What can I do in the kitchen?  Clean up any spills right away.  Avoid walking on wet floors.  Keep items that you use a lot in easy-to-reach places.  If you need to reach something above you, use a strong step stool that has a grab bar.  Keep electrical cords out of the way.  Do not use floor polish or wax that makes floors slippery. If you must use wax,  use non-skid floor wax.  Do not have throw rugs and other things on the floor that can make you trip. What can I do with my stairs?  Do not leave any items on the stairs.  Make sure that there are handrails on both sides of the stairs and use them. Fix handrails that are broken or loose. Make sure that handrails are as long as the stairways.  Check any carpeting to make sure that it is firmly attached to the stairs. Fix any carpet that is loose or worn.  Avoid having throw rugs at the top or bottom of the stairs. If you do have throw rugs, attach them to the floor with carpet tape.  Make sure that you have a light switch at the top of the stairs and the bottom of the stairs. If you do not have them, ask someone to add them for you. What else can I do to help prevent falls?  Wear shoes that:  Do not have high heels.  Have rubber bottoms.  Are comfortable and fit you  well.  Are closed at the toe. Do not wear sandals.  If you use a stepladder:  Make sure that it is fully opened. Do not climb a closed stepladder.  Make sure that both sides of the stepladder are locked into place.  Ask someone to hold it for you, if possible.  Clearly mark and make sure that you can see:  Any grab bars or handrails.  First and last steps.  Where the edge of each step is.  Use tools that help you move around (mobility aids) if they are needed. These include:  Canes.  Walkers.  Scooters.  Crutches.  Turn on the lights when you go into a dark area. Replace any light bulbs as soon as they burn out.  Set up your furniture so you have a clear path. Avoid moving your furniture around.  If any of your floors are uneven, fix them.  If there are any pets around you, be aware of where they are.  Review your medicines with your doctor. Some medicines can make you feel dizzy. This can increase your chance of falling. Ask your doctor what other things that you can do to help prevent falls. This information is not intended to replace advice given to you by your health care provider. Make sure you discuss any questions you have with your health care provider. Document Released: 04/04/2009 Document Revised: 11/14/2015 Document Reviewed: 07/13/2014 Elsevier Interactive Patient Education  2017 [employer].

## 2019-06-02 NOTE — Progress Notes (Signed)
Subjective:   Joanna Hood is a 73 y.o. female who presents for Medicare Annual (Subsequent) preventive examination.  Review of Systems: N/A   This visit is being conducted through telemedicine via telephone at the nurse health advisor's home address due to the COVID-19 pandemic. This patient has given me verbal consent via doximity to conduct this visit, patient states they are participating from their home address. Patient and myself are on the telephone call. There is no referral for this visit. Some vital signs may be absent or patient reported.    Patient identification: identified by name, DOB, and current address   Cardiac Risk Factors include: advanced age (>10mn, >>55women);hypertension     Objective:     Vitals: There were no vitals taken for this visit.  There is no height or weight on file to calculate BMI.  Advanced Directives 06/02/2019 05/26/2018 04/23/2016 09/22/2015 05/01/2013 04/29/2013  Does Patient Have a Medical Advance Directive? No No No No Patient does not have advance directive;Patient would like information Patient does not have advance directive;Patient would like information  Would patient like information on creating a medical advance directive? No - Patient declined No - Patient declined No - patient declined information - Advance directive packet given Advance directive packet given  Pre-existing out of facility DNR order (yellow form or pink MOST form) - - - - No -    Tobacco Social History   Tobacco Use  Smoking Status Former Smoker  . Quit date: 05/16/1969  . Years since quitting: 50.0  Smokeless Tobacco Former UEngineer, structuralgiven: Not Answered   Clinical Intake:  Pre-visit preparation completed: Yes  Pain : No/denies pain     Nutritional Risks: None Diabetes: No  How often do you need to have someone help you when you read instructions, pamphlets, or other written materials from your doctor or pharmacy?: 1 - Never What  is the last grade level you completed in school?: 12th  Interpreter Needed?: No  Information entered by :: CJohnson, LPN  Past Medical History:  Diagnosis Date  . [place]   persistent  . Chronic diastolic CHF (congestive heart failure) ([facility]    HTN  . ESOPHAGEAL STRICTURE 02/19/2005   Qualifier: Diagnosis of  By: MAmil AmenMD, EBenjamine Mola   . Goiter   . Heart murmur   . History of anemia    unclear cause, now resolved  . History of chicken pox   . History of colon polyps    benign  . HLD (hyperlipidemia)   . Hypothyroidism   . Malignant hypertension longstanding  . Microcytosis   . Osteopenia 12/2013   T -2.3 hip  . Osteoporosis   . [place]oid [place] 11[place]   +RF, +CCP, ESR 49, synovitis on exam and [place]/2017 ([facility]  . Right lower lobe pneumonia 07/24/2014  . SOB (shortness of breath) 04/29/2013  . Vitamin D deficiency    Past Surgical History:  Procedure Laterality Date  . CARDIAC CATHETERIZATION  2014   normal per patient  . COLONOSCOPY  11/2015   TA, int hem, rpt 5 yrs ([facility] Pastor  . LEFT HEART CATHETERIZATION WITH CORONARY ANGIOGRAM N/A 05/01/2013   Procedure: LEFT HEART CATHETERIZATION WITH CORONARY ANGIOGRAM;  Surgeon: DLeonie Man MD;  [facility];  Service: Cardiovascular;  Laterality: N/A;  . PARTIAL HYSTERECTOMY  1970   fibroids   Family History  Problem Relation Age of Onset  . Cancer Mother  ovarian  . Sudden death Mother   . Diabetes Sister   . Cancer Maternal Grandmother        lung  . Heart disease Maternal Grandmother   . Hypertension Sister   . Hypertension Cousin   . CAD Neg Hx   . Stroke Neg Hx    Social History   Socioeconomic History  . Marital status: Single    Spouse name: Not on file  . Number of children: 1  . Years of education: Not on file  . Highest education level: Not on file  Occupational History  . Occupation: Retired  Tobacco Use  . Smoking status: Former Smoker     Quit date: 05/16/1969    Years since quitting: 50.0  . Smokeless tobacco: Former Network engineer and Sexual Activity  . Alcohol use: No    Alcohol/week: 0.0 standard drinks  . Drug use: No  . Sexual activity: Never  Other Topics Concern  . 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 Difficulty of Paying Living Expenses: Not hard at all  Food Insecurity: No Food Insecurity  . Worried About Charity fundraiser in the Last Year: Never true  . Ran Out of Food in the Last Year: Never true  Transportation Needs: No Transportation Needs  . Lack of Transportation (Medical): No  . Lack of Transportation (Non-Medical): No  Physical Activity: Inactive  . Days of Exercise per Week: 0 days  . Minutes of Exercise per Session: 0 min  Stress: No Stress Concern Present  . Feeling of Stress : Not at all  Social Connections:   . Frequency of Communication with Friends and Family: Not on file  . Frequency of Social Gatherings with Friends and Family: Not on file  . Attends Religious Services: Not on file  . Active Member of Clubs or Organizations: Not on file  . Attends Archivist Meetings: Not on file  . Marital Status: Not on file    Outpatient Encounter Medications as of 06/02/2019  Medication Sig  . amLODipine (NORVASC) 10 MG tablet TAKE 1 TABLET EVERY DAY  . benzonatate (TESSALON) 100 MG capsule Take 1 capsule (100 mg total) by mouth 3 (three) times daily as needed for cough.  . Calcium Carb-Cholecalciferol (CALCIUM-VITAMIN D) 600-400 MG-UNIT TABS Take 1 tablet by mouth daily.  . cholecalciferol (VITAMIN D) 1000 UNITS tablet Take 1,000 Units by mouth daily.  Marland Kitchen ELIQUIS 5 MG TABS tablet TAKE 1 TABLET TWICE DAILY  . furosemide (LASIX) 20 MG tablet TAKE 1 TABLET EVERY DAY  .  guaiFENesin-codeine (CHERATUSSIN AC) 100-10 MG/5ML syrup Take 5 mLs by mouth 2 (two) times daily as needed for cough (sedation precautions).  Marland Kitchen levothyroxine (SYNTHROID) 125 MCG tablet TAKE 1 TABLET EVERY DAY BEFORE BREAKFAST  . meclizine (ANTIVERT) 25 MG tablet Take 1 tablet (25 mg total) by mouth 3 (three) times daily as needed. For vertigo  . methotrexate (RHEUMATREX) 2.5 MG tablet Take 15 mg by mouth once a week. Caution:Chemotherapy. Protect from light.  . metoprolol tartrate (LOPRESSOR) 25 MG tablet TAKE 1 TABLET TWICE DAILY  . pantoprazole (PROTONIX) 40 MG tablet TAKE 1 TABLET EVERY MONDAY, WEDNESDAY AND FRIDAY.  Marland Kitchen potassium chloride SA (K-DUR) 20 MEQ tablet TAKE 1 TABLET EVERY DAY  .  ramipril (ALTACE) 10 MG capsule TAKE 1 CAPSULE TWICE DAILY   No facility-administered encounter medications on file as of 06/02/2019.    Activities of Daily Living In your present state of health, do you have any difficulty performing the following activities: 06/02/2019  Hearing? N  Vision? N  Difficulty concentrating or making decisions? N  Walking or climbing stairs? N  Dressing or bathing? N  Doing errands, shopping? N  Preparing Food and eating ? N  Using the Toilet? N  In the past six months, have you accidently leaked urine? N  Do you have problems with loss of bowel control? N  Managing your Medications? N  Managing your Finances? N  Housekeeping or managing your Housekeeping? N  Some recent data might be hidden    Patient Care Team: Ria Bush, MD as PCP - General (Family Medicine) Lorretta Harp, MD as Consulting Physician (Cardiology) Gavin Pound, MD as Consulting Physician (Rheumatology) Shirl Harris, OD as Consulting Physician (Optometry)    Assessment:   This is a routine wellness examination for Sandhya.  Exercise Activities and Dietary recommendations Current Exercise Habits: The patient does not participate in regular exercise at present, Exercise limited by:  None identified  Goals    . DIET - INCREASE WATER INTAKE     Starting 05/26/2018, I will continue to drink at least 6-8 glasses of water daily.     . Patient Stated     06/02/2019, I will try to lose some weight in the future.        Fall Risk Fall Risk  06/02/2019 05/26/2018 05/20/2017 04/23/2016 04/23/2015  Falls in the past year? 0 0 No No No  Number falls in past yr: 0 - - - -  Injury with Fall? 0 - - - -  Risk for fall due to : Medication side effect - - - -  Follow up Falls evaluation completed;Falls prevention discussed - - - -   Is the patient's home free of loose throw rugs in walkways, pet beds, electrical cords, etc?   yes      Grab bars in the bathroom? no      Handrails on the stairs?   no      Adequate lighting?   yes  Timed Get Up and Go performed: N/A  Depression Screen PHQ 2/9 Scores 06/02/2019 05/26/2018 05/20/2017 04/23/2016  PHQ - 2 Score 0 0 0 0  PHQ- 9 Score 0 0 - -     Cognitive Function MMSE - Mini Mental State Exam 06/02/2019 05/26/2018 04/23/2016  Orientation to time 5 5 5   Orientation to Place 5 5 5   Registration 3 3 3   Attention/ Calculation 5 0 0  Recall 3 3 3   Language- name 2 objects - 0 0  Language- repeat 1 1 1   Language- follow 3 step command - 3 3  Language- read & follow direction - 0 0  Write a sentence - 0 0  Copy design - 0 0  Total score - 20 20  Mini Cog  Mini-Cog screen was completed. Maximum score is 22. A value of 0 denotes this part of the MMSE was not completed or the patient failed this part of the Mini-Cog screening.       Immunization History  Administered Date(s) Administered  . Influenza Whole 07/05/2009, 05/14/2010  . Influenza, High Dose Seasonal PF 04/03/2014, 03/17/2017, 04/05/2018, 04/04/2019  . Influenza,inj,Quad PF,6+ Mos 04/30/2013, 04/23/2016  . Influenza-Unspecified 04/12/2015  . Pneumococcal Conjugate-13 04/23/2015  .  Pneumococcal Polysaccharide-23 04/30/2013  . Td 06/23/2003  . Tdap 04/23/2016     Qualifies for Shingles Vaccine? yes  Screening Tests Health Maintenance  Topic Date Due  . MAMMOGRAM  12/19/2019  . COLONOSCOPY  11/21/2025  . DTaP/Tdap/Td (2 - Td) 04/23/2026  . TETANUS/TDAP  04/23/2026  . INFLUENZA VACCINE  Completed  . DEXA SCAN  Completed  . Hepatitis C Screening  Completed  . PNA vac Low Risk Adult  Completed    Cancer Screenings: Lung: Low Dose CT Chest recommended if Age 43-80 years, 30 pack-year currently smoking OR have quit w/in 15years. Patient does not qualify. Breast:  Up to date on Mammogram? Yes, completed 12/19/2018   Up to date of Bone Density/Dexa? Yes, completed 12/26/2013 Colorectal: completed 11/22/2015  Additional Screenings:  Hepatitis C Screening: 04/23/2016     Plan:   Patient will try to work on losing some weight.   I have personally reviewed and noted the following in the patient's chart:   . Medical and social history . Use of alcohol, tobacco or illicit drugs  . Current medications and supplements . Functional ability and status . Nutritional status . Physical activity . Advanced directives . List of other physicians . Hospitalizations, surgeries, and ER visits in previous 12 months . Vitals . Screenings to include cognitive, depression, and falls . Referrals and appointments  In addition, I have reviewed and discussed with patient certain preventive protocols, quality metrics, and best practice recommendations. A written personalized care plan for preventive services as well as general preventive health recommendations were provided to patient.     Andrez Grime, LPN  15/18/3437

## 2019-06-05 ENCOUNTER — Encounter: Payer: Self-pay | Admitting: Family Medicine

## 2019-06-05 ENCOUNTER — Other Ambulatory Visit: Payer: Self-pay

## 2019-06-05 ENCOUNTER — Ambulatory Visit (INDEPENDENT_AMBULATORY_CARE_PROVIDER_SITE_OTHER): Payer: Medicare HMO | Admitting: Family Medicine

## 2019-06-05 VITALS — BP 140/84 | HR 74 | Temp 97.7°F | Ht 62.5 in | Wt 173.0 lb

## 2019-06-05 DIAGNOSIS — Z7189 Other specified counseling: Secondary | ICD-10-CM

## 2019-06-05 DIAGNOSIS — I10 Essential (primary) hypertension: Secondary | ICD-10-CM

## 2019-06-05 DIAGNOSIS — Z0000 Encounter for general adult medical examination without abnormal findings: Secondary | ICD-10-CM

## 2019-06-05 DIAGNOSIS — M8580 Other specified disorders of bone density and structure, unspecified site: Secondary | ICD-10-CM | POA: Diagnosis not present

## 2019-06-05 DIAGNOSIS — M0579 Rheumatoid arthritis with rheumatoid factor of multiple sites without organ or systems involvement: Secondary | ICD-10-CM | POA: Diagnosis not present

## 2019-06-05 DIAGNOSIS — I4819 Other persistent atrial fibrillation: Secondary | ICD-10-CM | POA: Diagnosis not present

## 2019-06-05 DIAGNOSIS — I340 Nonrheumatic mitral (valve) insufficiency: Secondary | ICD-10-CM

## 2019-06-05 DIAGNOSIS — U071 COVID-19: Secondary | ICD-10-CM

## 2019-06-05 DIAGNOSIS — Z8619 Personal history of other infectious and parasitic diseases: Secondary | ICD-10-CM

## 2019-06-05 DIAGNOSIS — M069 Rheumatoid arthritis, unspecified: Secondary | ICD-10-CM | POA: Diagnosis not present

## 2019-06-05 DIAGNOSIS — E559 Vitamin D deficiency, unspecified: Secondary | ICD-10-CM | POA: Diagnosis not present

## 2019-06-05 DIAGNOSIS — E039 Hypothyroidism, unspecified: Secondary | ICD-10-CM | POA: Diagnosis not present

## 2019-06-05 MED ORDER — AMLODIPINE BESYLATE 10 MG PO TABS: 10.0000 mg | ORAL_TABLET | Freq: Every day | ORAL | 3 refills | Status: DC

## 2019-06-05 MED ORDER — PANTOPRAZOLE SODIUM 40 MG PO TBEC: DELAYED_RELEASE_TABLET | ORAL | 3 refills | Status: DC

## 2019-06-05 MED ORDER — LEVOTHYROXINE SODIUM 125 MCG PO TABS: ORAL_TABLET | ORAL | 3 refills | Status: DC

## 2019-06-05 NOTE — Assessment & Plan Note (Signed)
Continue cal, vit D, regular weight bearing exercise. Update DEXA.

## 2019-06-05 NOTE — Patient Instructions (Addendum)
We will schedule you for an updated bone density scan.  Work on advanced directive, bring me copy when complete.  You are doing well today  Continue calcium, vitamin D, and regular weight bearing exercise to keep bones strong.  Return as needed or in 1 year for next physical.   Health Maintenance After Age 72 After age 16, you are at a higher risk for certain long-term diseases and infections as well as injuries from falls. Falls are a major cause of broken bones and head injuries in people who are older than age 78. Getting regular preventive care can help to keep you healthy and well. Preventive care includes getting regular testing and making lifestyle changes as recommended by your health care provider. Talk with your health care provider about:  Which screenings and tests you should have. A screening is a test that checks for a disease when you have no symptoms.  A diet and exercise plan that is right for you. What should I know about screenings and tests to prevent falls? Screening and testing are the best ways to find a health problem early. Early diagnosis and treatment give you the best chance of managing medical conditions that are common after age 72. Certain conditions and lifestyle choices may make you more likely to have a fall. Your health care provider may recommend:  Regular vision checks. Poor vision and conditions such as cataracts can make you more likely to have a fall. If you wear glasses, make sure to get your prescription updated if your vision changes.  Medicine review. Work with your health care provider to regularly review all of the medicines you are taking, including over-the-counter medicines. Ask your health care provider about any side effects that may make you more likely to have a fall. Tell your health care provider if any medicines that you take make you feel dizzy or sleepy.  Osteoporosis screening. Osteoporosis is a condition that causes the bones to get  weaker. This can make the bones weak and cause them to break more easily.  Blood pressure screening. Blood pressure changes and medicines to control blood pressure can make you feel dizzy.  Strength and balance checks. Your health care provider may recommend certain tests to check your strength and balance while standing, walking, or changing positions.  Foot health exam. Foot pain and numbness, as well as not wearing proper footwear, can make you more likely to have a fall.  Depression screening. You may be more likely to have a fall if you have a fear of falling, feel emotionally low, or feel unable to do activities that you used to do.  Alcohol use screening. Using too much alcohol can affect your balance and may make you more likely to have a fall. What actions can I take to lower my risk of falls? General instructions  Talk with your health care provider about your risks for falling. Tell your health care provider if: ? You fall. Be sure to tell your health care provider about all falls, even ones that seem minor. ? You feel dizzy, sleepy, or off-balance.  Take over-the-counter and prescription medicines only as told by your health care provider. These include any supplements.  Eat a healthy diet and maintain a healthy weight. A healthy diet includes low-fat dairy products, low-fat (lean) meats, and fiber from whole grains, beans, and lots of fruits and vegetables. Home safety  Remove any tripping hazards, such as rugs, cords, and clutter.  Install safety equipment such as  grab bars in bathrooms and safety rails on stairs.  Keep rooms and walkways well-lit. Activity   Follow a regular exercise program to stay fit. This will help you maintain your balance. Ask your health care provider what types of exercise are appropriate for you.  If you need a cane or walker, use it as recommended by your health care provider.  Wear supportive shoes that have nonskid soles. Lifestyle  Do  not drink alcohol if your health care provider tells you not to drink.  If you drink alcohol, limit how much you have: ? 0-1 drink a day for women. ? 0-2 drinks a day for men.  Be aware of how much alcohol is in your drink. In the [place] one drink equals one typical bottle of beer (12 oz), one-half glass of wine (5 oz), or one shot of hard liquor (1 oz).  Do not use any products that contain nicotine or tobacco, such as cigarettes and e-cigarettes. If you need help quitting, ask your health care provider. Summary  Having a healthy lifestyle and getting preventive care can help to protect your health and wellness after age 103.  Screening and testing are the best way to find a health problem early and help you avoid having a fall. Early diagnosis and treatment give you the best chance for managing medical conditions that are more common for people who are older than age 56.  Falls are a major cause of broken bones and head injuries in people who are older than age 23. Take precautions to prevent a fall at home.  Work with your health care provider to learn what changes you can make to improve your health and wellness and to prevent falls. This information is not intended to replace advice given to you by your health care provider. Make sure you discuss any questions you have with your health care provider. Document Released: 04/21/2017 Document Revised: 09/29/2018 Document Reviewed: 04/21/2017 Elsevier Patient Education  2020 [employer].

## 2019-06-05 NOTE — Assessment & Plan Note (Signed)
Continue 1000 IU daily. 

## 2019-06-05 NOTE — Assessment & Plan Note (Signed)
Mod-severe MR and TR with dilated LA, RA with preserved EF.

## 2019-06-05 NOTE — Assessment & Plan Note (Signed)
Reassuringly recovered well from infection over the summer without sequelae.

## 2019-06-05 NOTE — Assessment & Plan Note (Signed)
Advanced directives: HCPOA would be daughter.Packet at home, encouraged she work on this.

## 2019-06-05 NOTE — Progress Notes (Addendum)
This visit was conducted in person.  BP 140/84 (BP Location: Left Arm, Patient Position: Sitting, Cuff Size: Large)   Pulse 74   Temp 97.7 F (36.5 C)   Ht 5' 2.5" (1.588 m)   Wt 173 lb (78.5 kg)   SpO2 98%   BMI 31.14 kg/m    CC: CPE Subjective:    Patient ID: Joanna Hood, female    DOB: September 03, 1945, 73 y.o.   MRN: [id number]  HPI: Joanna Hood is a 73 y.o. female presenting on 06/05/2019 for Annual Exam (Medicare Wellness Part 2)   Saw health advisor last week for medicare wellness visit. Note reviewed.    Hearing Screening   125Hz  250Hz  500Hz  1000Hz  2000Hz  3000Hz  4000Hz  6000Hz  8000Hz   Right ear:   25 25 25  25     Left ear:   0 0 40  0    Vision Screening Comments: November 2020    Clinical Support from 06/02/2019 in [place] at [facility] Total Score  0    trouble with L ear hearing - she denies trouble.  Fall Risk  06/02/2019 05/26/2018 05/20/2017 04/23/2016 04/23/2015  Falls in the past year? 0 0 No No No  Number falls in past yr: 0 - - - -  Injury with Fall? 0 - - - -  Risk for fall due to : Medication side effect - - - -  Follow up Falls evaluation completed;Falls prevention discussed - - - -      Preventative: COLONOSCOPY 11/2015-TA, int hem, rpt 5 yrs Henrene Pastor). Well woman exam - partial hysterectomy 1970, ovaries remain.All normal papsmears.No pelvic pain/pressure. Mammogram 11/2018 Bir[facility] 1. DEXA 12/2013 -T-2.3 osteopenia -pt gets calcium/vitamin D dailyand walks regularly. Agrees to repeat.  Flu shot yearly  Pneumovax 04/2013, prevnar2016 Tdap 2017 Shingrix- not interested  Advanced directives: HCPOA would be daughter.Packet at home, encouraged she work on this. Seat belt use discussed Sunscreen use discussed.No changing moles on skin.  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 Interim medical history since our last visit reviewed. Allergies and medications reviewed and updated. Outpatient Medications Prior to Visit  Medication Sig Dispense Refill  . Calcium Carb-Cholecalciferol (CALCIUM-VITAMIN D) 600-400 MG-UNIT TABS Take 1 tablet by mouth daily.    . cholecalciferol (VITAMIN D) 1000 UNITS tablet Take 1,000 Units by mouth daily.    Marland Kitchen ELIQUIS 5 MG TABS tablet TAKE 1 TABLET TWICE DAILY 180 tablet 1  . furosemide (LASIX) 20 MG tablet TAKE 1 TABLET EVERY DAY 90 tablet 0  . meclizine (ANTIVERT) 25 MG tablet Take 1 tablet (25 mg total) by mouth 3 (three) times daily as needed. For vertigo 90 tablet 0  . methotrexate (RHEUMATREX) 2.5 MG tablet Take 15 mg by mouth once a week. Caution:Chemotherapy. Protect from light.    . metoprolol tartrate (LOPRESSOR) 25 MG tablet TAKE 1 TABLET TWICE DAILY 180 tablet 0  . potassium chloride SA (K-DUR) 20 MEQ tablet TAKE 1 TABLET EVERY DAY 90 tablet 2  . ramipril (ALTACE) 10 MG capsule TAKE 1 CAPSULE TWICE DAILY 180 capsule 3  . amLODipine (NORVASC) 10 MG tablet  TAKE 1 TABLET EVERY DAY 90 tablet 3  . levothyroxine (SYNTHROID) 125 MCG tablet TAKE 1 TABLET EVERY DAY BEFORE BREAKFAST 90 tablet 2  . pantoprazole (PROTONIX) 40 MG tablet TAKE 1 TABLET EVERY MONDAY, WEDNESDAY AND FRIDAY. 36 tablet 3  . benzonatate (TESSALON) 100 MG capsule Take 1 capsule (100 mg total) by mouth 3 (three) times daily as needed for cough. 30 capsule 0  . guaiFENesin-codeine (CHERATUSSIN AC) 100-10 MG/5ML syrup Take 5 mLs by mouth 2 (two) times daily as needed for cough (sedation precautions). 120 mL 0   No facility-administered medications prior to visit.     Per HPI unless specifically  indicated in ROS section below Review of Systems  Constitutional: Negative for activity change, appetite change, chills, fatigue, fever and unexpected weight change.  HENT: Negative for hearing loss.   Eyes: Negative for visual disturbance.  Respiratory: Negative for cough, chest tightness, shortness of breath and wheezing.   Cardiovascular: Negative for chest pain, palpitations and leg swelling.  Gastrointestinal: Negative for abdominal distention, abdominal pain, blood in stool, constipation, diarrhea, nausea and vomiting.  Genitourinary: Negative for difficulty urinating and hematuria.  Musculoskeletal: Negative for arthralgias, myalgias and neck pain.  Skin: Negative for rash.  Neurological: Negative for dizziness, seizures, syncope and headaches.  Hematological: Negative for adenopathy. Does not bruise/bleed easily.  Psychiatric/Behavioral: Negative for dysphoric mood. The patient is not nervous/anxious.    Objective:    BP 140/84 (BP Location: Left Arm, Patient Position: Sitting, Cuff Size: Large)   Pulse 74   Temp 97.7 F (36.5 C)   Ht 5' 2.5" (1.588 m)   Wt 173 lb (78.5 kg)   SpO2 98%   BMI 31.14 kg/m   Wt Readings from Last 3 Encounters:  06/05/19 173 lb (78.5 kg)  02/07/19 176 lb 9.6 oz (80.1 kg)  01/20/19 179 lb (81.2 kg)    Physical Exam Vitals and nursing note reviewed.  Constitutional:      General: She is not in acute distress.    Appearance: Normal appearance. She is well-developed. She is obese. She is not ill-appearing.  HENT:     Head: Normocephalic and atraumatic.     Right Ear: Hearing, tympanic membrane, ear canal and external ear normal.     Left Ear: Hearing, tympanic membrane, ear canal and external ear normal.     Nose: Nose normal.     Mouth/Throat:     Pharynx: Uvula midline.  Eyes:     General: No scleral icterus.    Extraocular Movements: Extraocular movements intact.     Conjunctiva/sclera: Conjunctivae normal.     Pupils: Pupils are  equal, round, and reactive to light.  Neck:     Vascular: No carotid bruit.  Cardiovascular:     Rate and Rhythm: Normal rate and regular rhythm.     Pulses: Normal pulses.          Radial pulses are 2+ on the right side and 2+ on the left side.     Heart sounds: Murmur (4/6 systolic) present.  Pulmonary:     Effort: Pulmonary effort is normal. No respiratory distress.     Breath sounds: Normal breath sounds. No wheezing, rhonchi or rales.  Abdominal:     General: Abdomen is flat. Bowel sounds are normal. There is no distension.     Palpations: Abdomen is soft. There is no mass.     Tenderness: There is no abdominal tenderness. There is no guarding or rebound.     Hernia:  No hernia is present.  Musculoskeletal:        General: Normal range of motion.     Cervical back: Normal range of motion and neck supple.     Right lower leg: No edema.     Left lower leg: No edema.  Lymphadenopathy:     Cervical: No cervical adenopathy.  Skin:    General: Skin is warm and dry.     Findings: No rash.  Neurological:     General: No focal deficit present.     Mental Status: She is alert and oriented to person, place, and time.     Comments: CN grossly intact, station and gait intact  Psychiatric:        Mood and Affect: Mood normal.        Behavior: Behavior normal.        Thought Content: Thought content normal.        Judgment: Judgment normal.       Results for orders placed or performed in visit on 06/02/19  vit d  Result Value Ref Range   VITD 30.25 30.00 - 100.00 ng/mL  TSH  Result Value Ref Range   TSH 5.03 (H) 0.35 - 4.50 uIU/mL  CBC with Differential  Result Value Ref Range   WBC 4.4 4.0 - 10.5 K/uL   RBC 4.53 3.87 - 5.11 Mil/uL   Hemoglobin 11.8 (L) 12.0 - 15.0 g/dL   HCT 37.2 36.0 - 46.0 %   MCV 82.1 78.0 - 100.0 fl   MCHC 31.7 30.0 - 36.0 g/dL   RDW 18.2 (H) 11.5 - 15.5 %   Platelets 181.0 150.0 - 400.0 K/uL   Neutrophils Relative % 60.8 43.0 - 77.0 %   Lymphocytes  Relative 22.5 12.0 - 46.0 %   Monocytes Relative 12.8 (H) 3.0 - 12.0 %   Eosinophils Relative 2.4 0.0 - 5.0 %   Basophils Relative 1.5 0.0 - 3.0 %   Neutro Abs 2.7 1.4 - 7.7 K/uL   Lymphs Abs 1.0 0.7 - 4.0 K/uL   Monocytes Absolute 0.6 0.1 - 1.0 K/uL   Eosinophils Absolute 0.1 0.0 - 0.7 K/uL   Basophils Absolute 0.1 0.0 - 0.1 K/uL  Comprehensive metabolic panel  Result Value Ref Range   Sodium 139 135 - 145 mEq/L   Potassium 3.6 3.5 - 5.1 mEq/L   Chloride 104 96 - 112 mEq/L   CO2 27 19 - 32 mEq/L   Glucose, Bld 103 (H) 70 - 99 mg/dL   BUN 13 6 - 23 mg/dL   Creatinine, Ser 0.83 0.40 - 1.20 mg/dL   Total Bilirubin 0.9 0.2 - 1.2 mg/dL   Alkaline Phosphatase 97 39 - 117 U/L   AST 25 0 - 37 U/L   ALT 39 (H) 0 - 35 U/L   Total Protein 7.6 6.0 - 8.3 g/dL   Albumin 4.1 3.5 - 5.2 g/dL   GFR 81.76 >60.00 mL/min   Calcium 9.3 8.4 - 10.5 mg/dL  Lipid panel  Result Value Ref Range   Cholesterol 182 0 - 200 mg/dL   Triglycerides 69.0 0.0 - 149.0 mg/dL   HDL 55.40 >39.00 mg/dL   VLDL 13.8 0.0 - 40.0 mg/dL   LDL Cholesterol 113 (H) 0 - 99 mg/dL   Total CHOL/HDL Ratio 3    NonHDL 126.71   Microalbumin / creatinine urine ratio  Result Value Ref Range   Microalb, Ur 15.2 (H) 0.0 - 1.9 mg/dL   Creatinine,U 113.3 mg/dL  Microalb Creat Ratio 13.4 0.0 - 30.0 mg/g   Assessment & Plan:  This visit occurred during the SARS-CoV-2 public health emergency.  Safety protocols were in place, including screening questions prior to the visit, additional usage of staff PPE, and extensive cleaning of exam room while observing appropriate contact time as indicated for disinfecting solutions.   Problem List Items Addressed This Visit    Vitamin D deficiency    Continue 1000 IU daily.       Rheumatoid arthritis ([place])    Appreciate rheum care. Continues MTX.       Persistent atrial fibrillation (HCC)    Chronic, stable on eliquis and metoprolol. Sounds regular.       Relevant Medications    amLODipine (NORVASC) 10 MG tablet   Osteopenia    Continue cal, vit D, regular weight bearing exercise. Update DEXA.       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       Relevant Medications   amLODipine (NORVASC) 10 MG tablet   Hypothyroidism    TSH elevated however she denies hypothyroid symptoms. Will not make change to thyroid replacement dose.       Relevant Medications   levothyroxine (SYNTHROID) 125 MCG tablet   HTN (hypertension), malignant    Chronic, stable. Continue current regimen.       Relevant Medications   amLODipine (NORVASC) 10 MG tablet   Health maintenance examination - Primary    Preventative protocols reviewed and updated unless pt declined. Discussed healthy diet and lifestyle.       RESOLVED: COVID-19 virus infection    Reassuringly recovered well from infection over the summer without sequelae.       Advanced care planning/counseling discussion    Advanced directives: HCPOA would be daughter.Packet at home, encouraged she work on this.          Meds ordered this encounter  Medications  . amLODipine (NORVASC) 10 MG tablet    Sig: Take 1 tablet (10 mg total) by mouth daily.    Dispense:  90 tablet    Refill:  3  . pantoprazole (PROTONIX) 40 MG tablet    Sig: TAKE 1 TABLET EVERY MONDAY, WEDNESDAY AND FRIDAY.    Dispense:  36 tablet    Refill:  3  . levothyroxine (SYNTHROID) 125 MCG tablet    Sig: TAKE 1 TABLET EVERY DAY BEFORE BREAKFAST    Dispense:  90 tablet    Refill:  3   Orders Placed This Encounter  Procedures  . DG Bone Density    Standing Status:   Future    Standing Expiration Date:   08/05/2020    Order Specific Question:   Reason for Exam (SYMPTOM  OR DIAGNOSIS REQUIRED)    Answer:   osteopenia    Order Specific Question:   Preferred imaging location?    Answer:   [facility]   Patient instructions: We will schedule you for an  updated bone density scan.  Work on advanced directive, bring me copy when complete.  You are doing well today  Continue calcium, vitamin D, and regular weight bearing exercise to keep bones strong.  Return as needed or in 1 year for next physical.   Follow up plan: Return in about 1 year (around 06/04/2020) for medicare wellness visit, annual exam, prior fasting for blood work.  Ria Bush, MD

## 2019-06-05 NOTE — Progress Notes (Signed)
Hearing Screening   125Hz  250Hz  500Hz  1000Hz  2000Hz  3000Hz  4000Hz  6000Hz  8000Hz   Right ear:   25 25 25  25     Left ear:   0 0 40  0    Vision Screening Comments: November 2020

## 2019-06-05 NOTE — Addendum Note (Signed)
Addended by: Ria Bush on: 06/05/2019 09:54 AM   Modules accepted: Orders

## 2019-06-05 NOTE — Assessment & Plan Note (Signed)
Appreciate rheum care. Continues MTX.

## 2019-06-05 NOTE — Assessment & Plan Note (Signed)
Preventative protocols reviewed and updated unless pt declined. Discussed healthy diet and lifestyle.  

## 2019-06-05 NOTE — Assessment & Plan Note (Signed)
Chronic, stable. Continue current regimen. 

## 2019-06-05 NOTE — Assessment & Plan Note (Signed)
Chronic, stable on eliquis and metoprolol. Sounds regular.

## 2019-06-05 NOTE — Assessment & Plan Note (Signed)
TSH elevated however she denies hypothyroid symptoms. Will not make change to thyroid replacement dose.

## 2019-06-21 ENCOUNTER — Other Ambulatory Visit: Payer: Self-pay | Admitting: Family Medicine

## 2019-07-01 ENCOUNTER — Other Ambulatory Visit: Payer: Self-pay | Admitting: Cardiovascular Disease

## 2019-07-24 ENCOUNTER — Other Ambulatory Visit: Payer: Self-pay | Admitting: Family Medicine

## 2019-08-18 ENCOUNTER — Ambulatory Visit: Payer: Medicare HMO | Attending: Internal Medicine

## 2019-08-18 DIAGNOSIS — Z79899 Other long term (current) drug therapy: Secondary | ICD-10-CM | POA: Diagnosis not present

## 2019-08-18 DIAGNOSIS — Z1589 Genetic susceptibility to other disease: Secondary | ICD-10-CM | POA: Diagnosis not present

## 2019-08-18 DIAGNOSIS — E669 Obesity, unspecified: Secondary | ICD-10-CM | POA: Diagnosis not present

## 2019-08-18 DIAGNOSIS — M2550 Pain in unspecified joint: Secondary | ICD-10-CM | POA: Diagnosis not present

## 2019-08-18 DIAGNOSIS — Z23 Encounter for immunization: Secondary | ICD-10-CM

## 2019-08-18 DIAGNOSIS — M0579 Rheumatoid arthritis with rheumatoid factor of multiple sites without organ or systems involvement: Secondary | ICD-10-CM | POA: Diagnosis not present

## 2019-08-18 DIAGNOSIS — Z6830 Body mass index (BMI) 30.0-30.9, adult: Secondary | ICD-10-CM | POA: Diagnosis not present

## 2019-08-18 NOTE — Progress Notes (Signed)
   Covid-19 Vaccination Clinic  Name:  Joanna Hood    MRN: [id number] DOB: 1946/03/29  08/18/2019  Joanna Hood was observed post Covid-19 immunization for 15 minutes without incidence. She was provided with Vaccine Information Sheet and instruction to access the V-Safe system.   Joanna Hood was instructed to call 911 with any severe reactions post vaccine: Marland Kitchen Difficulty breathing  . Swelling of your face and throat  . A fast heartbeat  . A bad rash all over your body  . Dizziness and weakness    Immunizations Administered    Name Date Dose VIS Date Route   Pfizer COVID-19 Vaccine 08/18/2019  8:27 AM 0.3 mL 06/02/2019 Intramuscular   Manufacturer: [place]   Lot: [id number]   [place]: [id number]

## 2019-09-01 ENCOUNTER — Ambulatory Visit
Admission: RE | Admit: 2019-09-01 | Discharge: 2019-09-01 | Disposition: A | Discharge status: Home / Self Care | Payer: Medicare HMO | Source: Ambulatory Visit | Attending: Family Medicine | Admitting: Family Medicine

## 2019-09-01 ENCOUNTER — Other Ambulatory Visit: Payer: Self-pay

## 2019-09-01 DIAGNOSIS — Z780 Asymptomatic menopausal state: Secondary | ICD-10-CM | POA: Diagnosis not present

## 2019-09-01 DIAGNOSIS — M8580 Other specified disorders of bone density and structure, unspecified site: Secondary | ICD-10-CM

## 2019-09-01 DIAGNOSIS — M85851 Other specified disorders of bone density and structure, right thigh: Secondary | ICD-10-CM | POA: Diagnosis not present

## 2019-09-06 ENCOUNTER — Other Ambulatory Visit: Payer: Self-pay | Admitting: Cardiovascular Disease

## 2019-09-12 ENCOUNTER — Ambulatory Visit: Payer: Medicare HMO | Attending: Internal Medicine

## 2019-09-12 DIAGNOSIS — Z23 Encounter for immunization: Secondary | ICD-10-CM

## 2019-09-12 NOTE — Progress Notes (Signed)
   Covid-19 Vaccination Clinic  Name:  Sujata Tartaglione    MRN: [id number] DOB: 06-19-1946  09/12/2019  Ms. Slagel was observed post Covid-19 immunization for 15 minutes without incident. She was provided with Vaccine Information Sheet and instruction to access the V-Safe system.   Ms. Heydon was instructed to call 911 with any severe reactions post vaccine: Marland Kitchen Difficulty breathing  . Swelling of face and throat  . A fast heartbeat  . A bad rash all over body  . Dizziness and weakness   Immunizations Administered    Name Date Dose VIS Date Route   Pfizer COVID-19 Vaccine 09/12/2019  9:45 AM 0.3 mL 06/02/2019 Intramuscular   Manufacturer: [place]   Lot: [id number]   [place]: [id number]

## 2019-09-16 ENCOUNTER — Other Ambulatory Visit: Payer: Self-pay | Admitting: Cardiovascular Disease

## 2019-10-04 ENCOUNTER — Other Ambulatory Visit: Payer: Self-pay | Admitting: Cardiovascular Disease

## 2019-11-15 DIAGNOSIS — M0579 Rheumatoid arthritis with rheumatoid factor of multiple sites without organ or systems involvement: Secondary | ICD-10-CM | POA: Diagnosis not present

## 2019-11-15 DIAGNOSIS — Z79899 Other long term (current) drug therapy: Secondary | ICD-10-CM | POA: Diagnosis not present

## 2019-11-21 ENCOUNTER — Other Ambulatory Visit: Payer: Self-pay | Admitting: Family Medicine

## 2019-11-21 DIAGNOSIS — Z1231 Encounter for screening mammogram for malignant neoplasm of breast: Secondary | ICD-10-CM

## 2019-12-20 ENCOUNTER — Ambulatory Visit
Admission: RE | Admit: 2019-12-20 | Discharge: 2019-12-20 | Disposition: A | Discharge status: Home / Self Care | Payer: Medicare HMO | Source: Ambulatory Visit | Attending: Family Medicine | Admitting: Family Medicine

## 2019-12-20 ENCOUNTER — Other Ambulatory Visit: Payer: Self-pay

## 2019-12-20 DIAGNOSIS — Z1231 Encounter for screening mammogram for malignant neoplasm of breast: Secondary | ICD-10-CM

## 2019-12-21 LAB — HM MAMMOGRAPHY

## 2019-12-22 ENCOUNTER — Encounter: Payer: Self-pay | Admitting: Family Medicine

## 2020-01-25 ENCOUNTER — Other Ambulatory Visit: Payer: Self-pay | Admitting: Cardiovascular Disease

## 2020-02-01 ENCOUNTER — Telehealth: Payer: Self-pay | Admitting: *Deleted

## 2020-02-01 NOTE — Telephone Encounter (Signed)
A detailed message was left, re: her follow up visit. 

## 2020-02-19 ENCOUNTER — Ambulatory Visit (HOSPITAL_COMMUNITY): Payer: Medicare HMO | Attending: Cardiology

## 2020-02-19 ENCOUNTER — Other Ambulatory Visit: Payer: Self-pay

## 2020-02-19 DIAGNOSIS — I340 Nonrheumatic mitral (valve) insufficiency: Secondary | ICD-10-CM | POA: Diagnosis not present

## 2020-02-19 LAB — ECHOCARDIOGRAM COMPLETE
Area-P 1/2: 3.98 cm2
MV M vel: 5.64 m/s
MV Peak grad: 127.2 mmHg
Radius: 0.5 cm
S' Lateral: 3.2 cm

## 2020-03-19 ENCOUNTER — Ambulatory Visit: Payer: Medicare HMO | Admitting: Cardiovascular Disease

## 2020-03-19 ENCOUNTER — Encounter: Payer: Self-pay | Admitting: Cardiovascular Disease

## 2020-03-19 ENCOUNTER — Other Ambulatory Visit: Payer: Self-pay

## 2020-03-19 DIAGNOSIS — I4819 Other persistent atrial fibrillation: Secondary | ICD-10-CM

## 2020-03-19 DIAGNOSIS — I10 Essential (primary) hypertension: Secondary | ICD-10-CM

## 2020-03-19 MED ORDER — APIXABAN 5 MG PO TABS: 5.0000 mg | ORAL_TABLET | Freq: Two times a day (BID) | ORAL | 1 refills | Status: DC

## 2020-03-19 NOTE — Assessment & Plan Note (Signed)
History of persistent A-fib rate controlled on Eliquis oral anticoagulation. 

## 2020-03-19 NOTE — Assessment & Plan Note (Signed)
History of severe mitral regurgitation by recent 2D echo from 02/19/2020 with normal LV size, mild LVH, grade 3 diastolic dysfunction.  She does have severe left atrial enlargement.  Currently she has only mild dyspnea performing her normal activities.  We have discussed the possibility of mitral valve repair in the future.  We will get a 2D echo is to follow her LV size and function.

## 2020-03-19 NOTE — Assessment & Plan Note (Signed)
History of essential hypertension a blood pressure measured today 158/77.  She has not yet taken her antihypertensive medications yet today.  She is on amlodipine, metoprolol and ramipril.

## 2020-03-19 NOTE — Addendum Note (Signed)
Addended by: Caprice Beaver T on: 03/19/2020 03:50 PM   Modules accepted: Orders

## 2020-03-19 NOTE — Patient Instructions (Signed)
Medication Instructions:  The current medical regimen is effective;  continue present plan and medications.  *If you need a refill on your cardiac medications before your next appointment, please call your pharmacy*   Testing/Procedures: Echocardiogram (AUGUST 2022) - Your physician has requested that you have an echocardiogram. Echocardiography is a painless test that uses sound waves to create images of your heart. It provides your doctor with information about the size and shape of your heart and how well your hearts chambers and valves are working. This procedure takes approximately one hour. There are no restrictions for this procedure. This will be performed at our [facility] location - [address].    Follow-Up: At [facility], you and your health needs are our priority.  As part of our continuing mission to provide you with exceptional heart care, we have created designated Provider Care Teams.  These Care Teams include your primary Cardiologist (physician) and Advanced Practice Providers (APPs -  Physician Assistants and Nurse Practitioners) who all work together to provide you with the care you need, when you need it.  We recommend signing up for the patient portal called "MyChart".  Sign up information is provided on this After Visit Summary.  MyChart is used to connect with patients for Virtual Visits (Telemedicine).  Patients are able to view lab/test results, encounter notes, upcoming appointments, etc.  Non-urgent messages can be sent to your provider as well.   To learn more about what you can do with MyChart, go to [email or web address].    Your next appointment:   12 month(s)  The format for your next appointment:   In Person  Provider:   Quay Burow, MD

## 2020-03-19 NOTE — Progress Notes (Signed)
03/19/2020 Joanna Hood   Nov 08, 1945  [id number]  Primary Physician Joanna Bush, MD Primary Cardiologist: Joanna Harp MD Joanna Hood, [place]  HPI:  Joanna Hood is a 74 y.o.  moderately overweight African-American female who I initially met several years ago.I last saw her in the office  02/07/2019.Joanna KitchenWe first Joanna Hood she was admitted to [facility] with diastolic heart failure, hypertension and atrial fibrillation. She ultimately underwent cardiac catheterization revealing essentially normal coronary arteries and normal LV function by Dr. Ellyn Hack. Her A. Fib converted to sinus rhythm with the aid of IV Cardizem. Her blood pressure was better controlled. She saw Joanna Henri PA-C in the office and again was in atrial fibrillation at which time her beta blocker was adjusted and Eliquis was added because of increased CHA2DSVASC2 score of 2. Since I saw her in the office she remained clinically stable. She denies chest pain or shortness of breath.  She remains in A. fib on Eliquis oral anticoagulation.  She did contract COVID-19 in late July 2020 and completed her quarantine a week ago.  She had mild flulike symptoms.  She did have a 2D echo performed 05/08/2015 that showed normal LV function with moderate mitral regurgitation and severe left atrial enlargement.   Since I saw her a year ago she continues to do well.  She only has mild dyspnea performing her normal activities.  She did have a 2D echo performed 02/19/2020 that revealed normal LV size and function, mild LVH, grade 3 diastolic dysfunction, severe left atrial enlargement and severe mitral regurgitation.   Current Meds  Medication Sig  . amLODipine (NORVASC) 10 MG tablet Take 1 tablet (10 mg total) by mouth daily.  . Calcium Carb-Cholecalciferol (CALCIUM-VITAMIN D) 600-400 MG-UNIT TABS Take 1 tablet by mouth daily.  . cholecalciferol (VITAMIN D) 1000 UNITS tablet Take 1,000 Units by mouth  daily.  Joanna Hood ELIQUIS 5 MG TABS tablet TAKE 1 TABLET TWICE DAILY  . furosemide (LASIX) 20 MG tablet TAKE 1 TABLET EVERY DAY  . levothyroxine (SYNTHROID) 125 MCG tablet TAKE 1 TABLET EVERY DAY BEFORE BREAKFAST  . meclizine (ANTIVERT) 25 MG tablet Take 1 tablet (25 mg total) by mouth 3 (three) times daily as needed. For vertigo  . methotrexate (RHEUMATREX) 2.5 MG tablet Take 15 mg by mouth once a week. Caution:Chemotherapy. Protect from light.  . metoprolol tartrate (LOPRESSOR) 25 MG tablet TAKE 1 TABLET TWICE DAILY  . pantoprazole (PROTONIX) 40 MG tablet TAKE 1 TABLET EVERY MONDAY, WEDNESDAY, AND FRIDAY.  Joanna Hood potassium chloride SA (KLOR-CON) 20 MEQ tablet TAKE 1 TABLET EVERY DAY  . ramipril (ALTACE) 10 MG capsule TAKE 1 CAPSULE TWICE DAILY     Allergies  Allergen Reactions  . Metronidazole     REACTION: Rash, itching    Social History   Socioeconomic History  . Marital status: Single    Spouse name: Not on file  . Number of children: 1  . Years of education: Not on file  . Highest education level: Not on file  Occupational History  . Occupation: Retired  Tobacco Use  . Smoking status: Former Smoker    Quit date: 05/16/1969    Years since quitting: 50.8  . Smokeless tobacco: Former Network engineer  . Vaping Use: Never used  Substance and Sexual Activity  . Alcohol use: No    Alcohol/week: 0.0 standard drinks  . Drug use: No  . Sexual activity: Never  Other Topics Concern  . 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 Difficulty of Paying Living Expenses: Not hard at all  Food Insecurity: No Food Insecurity  . Worried About Charity fundraiser in the Last Year: Never true  . Ran Out of Food in the Last Year: Never true  Transportation Needs: No Transportation Needs  .  Lack of Transportation (Medical): No  . Lack of Transportation (Non-Medical): No  Physical Activity: Inactive  . Days of Exercise per Week: 0 days  . Minutes of Exercise per Session: 0 min  Stress: No Stress Concern Present  . Feeling of Stress : Not at all  Social Connections:   . Frequency of Communication with Friends and Family: Not on file  . Frequency of Social Gatherings with Friends and Family: Not on file  . Attends Religious Services: Not on file  . Active Member of Clubs or Organizations: Not on file  . Attends Archivist Meetings: Not on file  . Marital Status: Not on file  Intimate Partner Violence: Not At Risk  . Fear of Current or Ex-Partner: No  . Emotionally Abused: No  . Physically Abused: No  . Sexually Abused: No     Review of Systems: General: negative for chills, fever, night sweats or weight changes.  Cardiovascular: negative for chest pain, dyspnea on exertion, edema, orthopnea, palpitations, paroxysmal nocturnal dyspnea or shortness of breath Dermatological: negative for rash Respiratory: negative for cough or wheezing Urologic: negative for hematuria Abdominal: negative for nausea, vomiting, diarrhea, bright red blood per rectum, melena, or hematemesis Neurologic: negative for visual changes, syncope, or dizziness All other systems reviewed and are otherwise negative except as noted above.    Blood pressure (!) 158/77, pulse 63, height _0  (1.626 m), weight 176 lb 9.6 oz (80.1 kg), SpO2 95 %.  General appearance: alert and no distress Neck: no adenopathy, no carotid bruit, no JVD, supple, symmetrical, trachea midline and thyroid not enlarged, symmetric, no tenderness/mass/nodules Lungs: clear to auscultation bilaterally Heart: irregularly irregular rhythm Extremities: extremities normal, atraumatic, no cyanosis or edema Pulses: 2+ and symmetric Skin: Skin color, texture, turgor normal. No rashes or lesions Neurologic: Alert and oriented X  3, normal strength and tone. Normal symmetric reflexes. Normal coordination and gait  EKG atrial fibrillation with ventricular sponsor 63 nonspecific ST and T wave changes.  I personally reviewed this EKG.  ASSESSMENT AND PLAN:   Persistent atrial fibrillation History of persistent A. fib rate controlled on Eliquis oral anticoagulation.  HTN (hypertension), malignant History of essential hypertension a blood pressure measured today 158/77.  She has not yet taken her antihypertensive medications yet today.  She is on amlodipine, metoprolol and ramipril.  Mitral valve regurgitation History of severe mitral regurgitation by recent 2D echo from 02/19/2020 with normal LV size, mild LVH, grade 3 diastolic dysfunction.  She does have severe left atrial enlargement.  Currently she has only mild dyspnea performing her normal activities.  We have discussed the possibility of mitral valve repair in the future.  We will get a 2D echo is to follow her LV size and function.      Joanna Harp MD FACP,FACC,FAHA, [facility] 03/19/2020 9:32 AM

## 2020-03-20 DIAGNOSIS — Z79899 Other long term (current) drug therapy: Secondary | ICD-10-CM | POA: Diagnosis not present

## 2020-03-20 DIAGNOSIS — M0579 Rheumatoid arthritis with rheumatoid factor of multiple sites without organ or systems involvement: Secondary | ICD-10-CM | POA: Diagnosis not present

## 2020-03-20 DIAGNOSIS — E669 Obesity, unspecified: Secondary | ICD-10-CM | POA: Diagnosis not present

## 2020-03-20 DIAGNOSIS — M2550 Pain in unspecified joint: Secondary | ICD-10-CM | POA: Diagnosis not present

## 2020-03-20 DIAGNOSIS — Z6830 Body mass index (BMI) 30.0-30.9, adult: Secondary | ICD-10-CM | POA: Diagnosis not present

## 2020-03-20 DIAGNOSIS — Z1589 Genetic susceptibility to other disease: Secondary | ICD-10-CM | POA: Diagnosis not present

## 2020-03-27 ENCOUNTER — Other Ambulatory Visit: Payer: Self-pay | Admitting: Family Medicine

## 2020-04-04 ENCOUNTER — Other Ambulatory Visit: Payer: Self-pay | Admitting: Cardiovascular Disease

## 2020-06-05 ENCOUNTER — Other Ambulatory Visit: Payer: Self-pay | Admitting: Family Medicine

## 2020-06-06 NOTE — Telephone Encounter (Signed)
E-scribed refill.  Plz schedule wellness, lab and cpe visits.  

## 2020-06-08 ENCOUNTER — Other Ambulatory Visit: Payer: Self-pay | Admitting: Cardiovascular Disease

## 2020-06-12 ENCOUNTER — Other Ambulatory Visit: Payer: Self-pay | Admitting: Cardiovascular Disease

## 2020-06-13 NOTE — Telephone Encounter (Signed)
Left vm for the patient to call to schedule cpe, awv, and labs. EM

## 2020-06-20 ENCOUNTER — Ambulatory Visit (HOSPITAL_COMMUNITY)
Admission: EM | Admit: 2020-06-20 | Discharge: 2020-06-20 | Disposition: A | Discharge status: Home / Self Care | Payer: Medicare HMO | Attending: Emergency Medicine | Admitting: Emergency Medicine

## 2020-06-20 ENCOUNTER — Other Ambulatory Visit: Payer: Self-pay

## 2020-06-20 ENCOUNTER — Encounter (HOSPITAL_COMMUNITY): Payer: Self-pay | Admitting: Emergency Medicine

## 2020-06-20 ENCOUNTER — Ambulatory Visit (INDEPENDENT_AMBULATORY_CARE_PROVIDER_SITE_OTHER): Payer: Medicare HMO

## 2020-06-20 DIAGNOSIS — R059 Cough, unspecified: Secondary | ICD-10-CM | POA: Diagnosis not present

## 2020-06-20 DIAGNOSIS — I517 Cardiomegaly: Secondary | ICD-10-CM | POA: Diagnosis not present

## 2020-06-20 DIAGNOSIS — Z20822 Contact with and (suspected) exposure to covid-19: Secondary | ICD-10-CM | POA: Insufficient documentation

## 2020-06-20 DIAGNOSIS — J811 Chronic pulmonary edema: Secondary | ICD-10-CM | POA: Diagnosis not present

## 2020-06-20 LAB — SARS CORONAVIRUS 2 (TAT 6-24 HRS): SARS Coronavirus 2: NEGATIVE

## 2020-06-20 MED ORDER — FLUTICASONE PROPIONATE 50 MCG/ACT NA SUSP: 2.0000 | Freq: Every day | NASAL | 0 refills | Status: DC

## 2020-06-20 MED ORDER — BENZONATATE 200 MG PO CAPS: 200.0000 mg | ORAL_CAPSULE | Freq: Three times a day (TID) | ORAL | 0 refills | Status: DC | PRN

## 2020-06-20 NOTE — Discharge Instructions (Addendum)
Tessalon will help with cough.  Saline nasal irrigation with a Lloyd Huger med rinse and distilled water as often as you want, Flonase which will help with the nasal congestion and postnasal drip.  Hopefully this will help with your cough as well.  I would increase your Lasix to 30 mg once a day.  Follow-up with your doctor as soon as you can.  I will send him a note asking him to see you.  We will contact you if your Covid comes back positive and I will then contact the monoclonal antibody infusion team at that time.  They will call you within 24 to 48 hours and arrange a monoclonal antibody infusion if you qualify for one.  Go immediately to the ER for the signs and symptoms we discussed.  Feel better soon.

## 2020-06-20 NOTE — ED Provider Notes (Signed)
HPI  SUBJECTIVE:  Joanna Hood is a 74 y.o. female who presents with 10 days of cough productive of thick white-yellow mucus, shortness of breath and chest congestion. She was tested for Covid 4 days ago and is still waiting for the results. No fevers, body aches, headaches, nasal congestion, rhinorrhea, postnasal drip, sinus pain or pressure, loss of sense of smell or taste, wheezing, chest pain, nausea, vomiting, diarrhea. No known Covid or flu exposure. She got the Covid booster in September or October and the flu vaccine. States that she is unable to sleep at night secondary to the cough. She has had similar symptoms before which was found to be a pneumonia. She has tried procedure in high blood pressure with some improvement in her symptoms. Symptoms are worse when she gets overheated. No antibiotics in the past 3 months. No antipyretic in the past 6 hours. She also states that she has missed a few doses of her blood pressure medication recently due to not feeling well. She did not take her blood pressure medications this morning. She does not check it at home. She has a past medical history of atrial fibrillation on Eliquis, hypertension, lung abscess, pneumonia, hypercholesterolemia, rheumatoid arthritis on methotrexate, COVID in July 2020, congestive heart failure. No history of chronic kidney disease, coronary disease, pulmonary disease, smoking, stroke, MI. XTG:GYIRSWNIO, Garlon Hatchet, MD Cardiology: Dr. Gwenlyn Found.    Past Medical History:  Diagnosis Date  . Atrial fibrillation with RVR ([place]) 2014   persistent  . Chronic diastolic CHF (congestive heart failure) (HCC)    HTN  . COVID-19 virus infection 01/20/2019  . ESOPHAGEAL STRICTURE 02/19/2005   Qualifier: Diagnosis of  By: Amil Amen MD, Benjamine Mola    . Goiter   . Heart murmur   . History of anemia    unclear cause, now resolved  . History of chicken pox   . History of colon polyps    benign  . HLD (hyperlipidemia)   . Hypothyroidism    . Malignant hypertension longstanding  . Microcytosis   . Osteopenia 12/2013   T -2.3 hip  . Osteoporosis   . Rheumatoid arthritis ([place]) 11/2015   +RF, +CCP, ESR 49, synovitis on exam and [place] 11/2015 [facility])  . Right lower lobe pneumonia 07/24/2014  . SOB (shortness of breath) 04/29/2013  . Vitamin D deficiency     Past Surgical History:  Procedure Laterality Date  . CARDIAC CATHETERIZATION  2014   normal per patient  . COLONOSCOPY  11/2015   TA, int hem, rpt 5 yrs [facility])  . LEFT HEART CATHETERIZATION WITH CORONARY ANGIOGRAM N/A 05/01/2013   Procedure: LEFT HEART CATHETERIZATION WITH CORONARY ANGIOGRAM;  Surgeon: Leonie Man, MD;  Location: [facility] CATH LAB;  Service: Cardiovascular;  Laterality: N/A;  . PARTIAL HYSTERECTOMY  1970   fibroids    Family History  Problem Relation Age of Onset  . Cancer Mother        ovarian  . Sudden death Mother   . Diabetes Sister   . Cancer Maternal Grandmother        lung  . Heart disease Maternal Grandmother   . Hypertension Sister   . Hypertension Cousin   . CAD Neg Hx   . Stroke Neg Hx     Social History   Tobacco Use  . Smoking status: Former Smoker    Quit date: 05/16/1969    Years since quitting: 51.1  . Smokeless tobacco: Former Network engineer  . Vaping Use: Never used  Substance Use Topics  . Alcohol use: No    Alcohol/week: 0.0 standard drinks  . Drug use: No    No current facility-administered medications for this encounter.  Current Outpatient Medications:  .  benzonatate (TESSALON) 200 MG capsule, Take 1 capsule (200 mg total) by mouth 3 (three) times daily as needed for cough., Disp: 30 capsule, Rfl: 0 .  fluticasone (FLONASE) 50 MCG/ACT nasal spray, Place 2 sprays into both nostrils daily., Disp: 16 g, Rfl: 0 .  potassium chloride SA (KLOR-CON) 20 MEQ tablet, TAKE 1 TABLET EVERY DAY, Disp: 90 tablet, Rfl: 2 .  amLODipine (NORVASC) 10 MG tablet, Take 1 tablet (10 mg total) by mouth daily., Disp: 90 tablet, Rfl:  3 .  apixaban (ELIQUIS) 5 MG TABS tablet, Take 1 tablet (5 mg total) by mouth 2 (two) times daily., Disp: 180 tablet, Rfl: 1 .  Calcium Carb-Cholecalciferol (CALCIUM-VITAMIN D) 600-400 MG-UNIT TABS, Take 1 tablet by mouth daily., Disp: , Rfl:  .  cholecalciferol (VITAMIN D) 1000 UNITS tablet, Take 1,000 Units by mouth daily., Disp: , Rfl:  .  furosemide (LASIX) 20 MG tablet, TAKE 1 TABLET EVERY DAY, Disp: 90 tablet, Rfl: 1 .  levothyroxine (SYNTHROID) 125 MCG tablet, TAKE 1 TABLET EVERY DAY BEFORE BREAKFAST, Disp: 90 tablet, Rfl: 0 .  meclizine (ANTIVERT) 25 MG tablet, Take 1 tablet (25 mg total) by mouth 3 (three) times daily as needed. For vertigo, Disp: 90 tablet, Rfl: 0 .  methotrexate (RHEUMATREX) 2.5 MG tablet, Take 15 mg by mouth once a week. Caution:Chemotherapy. Protect from light., Disp: , Rfl:  .  metoprolol tartrate (LOPRESSOR) 25 MG tablet, TAKE 1 TABLET TWICE DAILY, Disp: 180 tablet, Rfl: 1 .  pantoprazole (PROTONIX) 40 MG tablet, TAKE 1 TABLET EVERY MONDAY, WEDNESDAY, AND FRIDAY., Disp: 108 tablet, Rfl: 3 .  ramipril (ALTACE) 10 MG capsule, TAKE 1 CAPSULE TWICE DAILY, Disp: 180 capsule, Rfl: 3  Allergies  Allergen Reactions  . Metronidazole     REACTION: Rash, itching     ROS  As noted in HPI.   Physical Exam  BP (S) (!) 197/98 (BP Location: Right Arm)   Pulse (!) 101   Temp 97.7 F (36.5 C) (Oral)   Resp 20   SpO2 97%   Constitutional: Well developed, well nourished, no acute distress Eyes:  EOMI, conjunctiva normal bilaterally HENT: Normocephalic, atraumatic,mucus membranes moist.  Positive nasal congestion.  No maxillary, frontal sinus tenderness.  Normal oropharynx.  Positive cobblestoning.  No postnasal drip. Respiratory: Normal inspiratory effort lungs clear bilaterally, good air movement. Cardiovascular: Irregularly irregular, no murmurs rubs or gallops GI: nondistended skin: No rash, skin intact Musculoskeletal: No calf tenderness, calves symmetric, no  edema. Neurologic: Alert & oriented x 3, no focal neuro deficits Psychiatric: Speech and behavior appropriate   ED Course   Medications - No data to display  Orders Placed This Encounter  Procedures  . SARS CORONAVIRUS 2 (TAT 6-24 HRS) Nasopharyngeal Nasopharyngeal Swab    Standing Status:   Standing    Number of Occurrences:   1    Order Specific Question:   Is this test for diagnosis or screening    Answer:   Diagnosis of ill patient    Order Specific Question:   Symptomatic for COVID-19 as defined by CDC    Answer:   Yes    Order Specific Question:   Date of Symptom Onset    Answer:   06/10/2020    Order Specific Question:   Hospitalized for  COVID-19    Answer:   No    Order Specific Question:   Admitted to ICU for COVID-19    Answer:   No    Order Specific Question:   Previously tested for COVID-19    Answer:   No    Order Specific Question:   Resident in a congregate (group) care setting    Answer:   No    Order Specific Question:   Employed in healthcare setting    Answer:   No    Order Specific Question:   Pregnant    Answer:   No    Order Specific Question:   Has patient completed COVID vaccination(s) (2 doses of Pfizer/Moderna 1 dose of [employer])    Answer:   Yes  . DG Chest 2 View    Standing Status:   Standing    Number of Occurrences:   1    Order Specific Question:   Reason for Exam (SYMPTOM  OR DIAGNOSIS REQUIRED)    Answer:   cough r/o PNA    Results for orders placed or performed during the hospital encounter of 06/20/20 (from the past 24 hour(s))  SARS CORONAVIRUS 2 (TAT 6-24 HRS) Nasopharyngeal Nasopharyngeal Swab     Status: None   Collection Time: 06/20/20 12:39 PM   Specimen: Nasopharyngeal Swab  Result Value Ref Range   SARS Coronavirus 2 NEGATIVE NEGATIVE   DG Chest 2 View  Result Date: 06/20/2020 CLINICAL DATA:  Cough. EXAM: CHEST - 2 VIEW COMPARISON:  05/11/2018 FINDINGS: Marked cardiomegaly shows mild increase since previous  study. Diffuse pulmonary vascular congestion is unchanged in appearance. No evidence of acute pulmonary edema or other infiltrate. No evidence of pleural effusion. Aortic atherosclerotic calcification again noted. IMPRESSION: Mild increase in cardiomegaly. Stable diffuse pulmonary vascular congestion. No active lung disease. Electronically Signed   By: Marlaine Hind M.D.   On: 06/20/2020 12:48    ED Clinical Impression  1. Cough   2. Encounter for laboratory testing for COVID-19 virus      ED Assessment/Plan   Blood pressure noted.  Patient states that she has missed several days of her blood pressure medication recent and did not take this morning's dose.  She currently denies any symptoms Pt has no historical evidence of end organ damage. Pt denies HA, visual changes, focal paresis, syncope, new onset seizure activity. CP,  palpitations, pedal edema, tearing pain radiating to back or abd. Pt denied any renal sx such as anuria or hematuria.   Checking COVID as patient's results were not back from 4 days ago.  We will also check chest x-ray given history of pneumonia and duration of symptoms.  Will reevaluate.  Reviewed imaging independently.  Mild increase in cardiomegaly.  Stable diffuse pulmonary vascular congestion.  No evidence of acute pulmonary edema or other infiltrate.  No pleural effusion.  See radiology report for full details.  There is no evidence of infiltrate suggestive of pneumonia. deferring antibiotic treatment.  She denies nasal congestion, sinus pain or pressure.  She has no sinus tenderness although she does have some sinus congestion.  We can try some Flonase saline nasal irrigation, Tessalon.  Her chest x-ray is stable with pulmonary vascular congestion, mild increase in cardiomegaly.  No evidence of acute pulmonary edema.  We will have her increase her Lasix to 30 mg and have her follow-up with her doctor ASAP.  Sent a note to Dr. Leo Grosser. strict ER return precautions  given.  Covid negative.  Discussed labs, imaging, MDM, treatment plan, and plan for follow-up with patient. Discussed sn/sx that should prompt return to the ED. patient agrees with plan.   Meds ordered this encounter  Medications  . benzonatate (TESSALON) 200 MG capsule    Sig: Take 1 capsule (200 mg total) by mouth 3 (three) times daily as needed for cough.    Dispense:  30 capsule    Refill:  0  . fluticasone (FLONASE) 50 MCG/ACT nasal spray    Sig: Place 2 sprays into both nostrils daily.    Dispense:  16 g    Refill:  0    *This clinic note was created using L[employer]. Therefore, there may be occasional mistakes despite careful proofreading.   ?    Melynda Ripple, MD 06/21/20 [phone]

## 2020-06-20 NOTE — ED Triage Notes (Signed)
PT C/O: cold sx onset 1 week ++ .... sx include: cough  Concerned for pneumonia   Was recently tested for COVID on Sunday 12/26 and is waiting for the results.   BP today is 197/98 but has not taken BP meds today  DENIES: f/n/v/d  TAKING MEDS: OTC cold meds  A&O x4... NAD... Ambulatory

## 2020-06-27 DIAGNOSIS — M0579 Rheumatoid arthritis with rheumatoid factor of multiple sites without organ or systems involvement: Secondary | ICD-10-CM | POA: Diagnosis not present

## 2020-06-28 ENCOUNTER — Ambulatory Visit (INDEPENDENT_AMBULATORY_CARE_PROVIDER_SITE_OTHER): Payer: Medicare HMO | Admitting: Family Medicine

## 2020-06-28 ENCOUNTER — Encounter: Payer: Self-pay | Admitting: Family Medicine

## 2020-06-28 ENCOUNTER — Other Ambulatory Visit: Payer: Self-pay

## 2020-06-28 VITALS — BP 160/80 | HR 76 | Temp 95.6°F | Ht 64.0 in | Wt 167.4 lb

## 2020-06-28 DIAGNOSIS — I10 Essential (primary) hypertension: Secondary | ICD-10-CM | POA: Diagnosis not present

## 2020-06-28 DIAGNOSIS — R059 Cough, unspecified: Secondary | ICD-10-CM | POA: Diagnosis not present

## 2020-06-28 DIAGNOSIS — I4819 Other persistent atrial fibrillation: Secondary | ICD-10-CM

## 2020-06-28 DIAGNOSIS — I340 Nonrheumatic mitral (valve) insufficiency: Secondary | ICD-10-CM

## 2020-06-28 DIAGNOSIS — I5189 Other ill-defined heart diseases: Secondary | ICD-10-CM | POA: Diagnosis not present

## 2020-06-28 DIAGNOSIS — R051 Acute cough: Secondary | ICD-10-CM | POA: Insufficient documentation

## 2020-06-28 NOTE — Assessment & Plan Note (Signed)
Appreciate cards care. Irregularly irregular today, continues eliquis and metoprolol.

## 2020-06-28 NOTE — Progress Notes (Signed)
Patient ID: Joanna Hood, female    DOB: [birth year], 75 y.o.   MRN: [id number]  This visit was conducted in person.  BP (!) 160/80 (BP Location: Right Arm, Patient Position: Sitting, Cuff Size: Large)   Pulse 76   Temp (!) 95.6 F (35.3 C) (Temporal)   Ht 5\' 4"  (1.626 m)   Wt 167 lb 6.4 oz (75.9 kg)   SpO2 95%   BMI 28.73 kg/m   BP Readings from Last 3 Encounters:  06/28/20 (!) 160/80  06/20/20 (S) (!) 197/98  03/19/20 (!) 158/77  160/78 on repeat BP testing CC: [facility] f/u visit  Subjective:   HPI: Oneka Jessyka Hood is a 75 y.o. female presenting on 06/28/2020 for Follow-up   [facility] records reviewed.  Seen at [facility] 06/20/2020 with cough dyspnea and chest congestion. COVID test returned negative. CXR showed cardiomegaly with stable pulmonary vascular congestion. Thought possible acute CHF - rec flonase, nasal saline, tessalon perls, and increased lasix to 30mg  daily for the week. She hasn't noticed significant difference in UOP with increased lasix but notes cough has significantly improved. No leg swelling, dyspnea. Overall feeling much better.   BP elevated today (doesn't check at home - will start checking) - attributes to increased family stress maternal aunt suffering with mental problems. She continues metoprolol 25mg  bid, ramipril 10mg  bid, amlodipine 10mg  daily, and lasix (currently 30mg  daily).   She has completed COVID shots, booster, and flu shot.  10 lb weight loss noted since last seen here - denies significant   Known h/o severe MVR, LVH and grade 3 diastolic dysfunction with severe LA enlargement. Known persistent afib on eliquis. Rpt echo planned 02/2021 then f/u with cards.      Relevant past medical, surgical, family and social history reviewed and updated as indicated. Interim medical history since our last visit reviewed. Allergies and medications reviewed and updated. Outpatient Medications Prior to Visit  Medication Sig Dispense Refill  . amLODipine  (NORVASC) 10 MG tablet Take 1 tablet (10 mg total) by mouth daily. 90 tablet 3  . apixaban (ELIQUIS) 5 MG TABS tablet Take 1 tablet (5 mg total) by mouth 2 (two) times daily. 180 tablet 1  . benzonatate (TESSALON) 200 MG capsule Take 1 capsule (200 mg total) by mouth 3 (three) times daily as needed for cough. 30 capsule 0  . Calcium Carb-Cholecalciferol (CALCIUM-VITAMIN D) 600-400 MG-UNIT TABS Take 1 tablet by mouth daily.    . cholecalciferol (VITAMIN D) 1000 UNITS tablet Take 1,000 Units by mouth daily.    . fluticasone (FLONASE) 50 MCG/ACT nasal spray Place 2 sprays into both nostrils daily. 16 g 0  . furosemide (LASIX) 20 MG tablet TAKE 1 TABLET EVERY DAY 90 tablet 1  . levothyroxine (SYNTHROID) 125 MCG tablet TAKE 1 TABLET EVERY DAY BEFORE BREAKFAST 90 tablet 0  . meclizine (ANTIVERT) 25 MG tablet Take 1 tablet (25 mg total) by mouth 3 (three) times daily as needed. For vertigo 90 tablet 0  . methotrexate (RHEUMATREX) 2.5 MG tablet Take 15 mg by mouth once a week. Caution:Chemotherapy. Protect from light.    . metoprolol tartrate (LOPRESSOR) 25 MG tablet TAKE 1 TABLET TWICE DAILY 180 tablet 1  . pantoprazole (PROTONIX) 40 MG tablet TAKE 1 TABLET EVERY MONDAY, WEDNESDAY, AND FRIDAY. 108 tablet 3  . potassium chloride SA (KLOR-CON) 20 MEQ tablet TAKE 1 TABLET EVERY DAY 90 tablet 2  . ramipril (ALTACE) 10 MG capsule TAKE 1 CAPSULE TWICE DAILY 180 capsule 3  No facility-administered medications prior to visit.     Per HPI unless specifically indicated in ROS section below Review of Systems Objective:  BP (!) 160/80 (BP Location: Right Arm, Patient Position: Sitting, Cuff Size: Large)   Pulse 76   Temp (!) 95.6 F (35.3 C) (Temporal)   Ht 5\' 4"  (1.626 m)   Wt 167 lb 6.4 oz (75.9 kg)   SpO2 95%   BMI 28.73 kg/m   Wt Readings from Last 3 Encounters:  06/28/20 167 lb 6.4 oz (75.9 kg)  03/19/20 176 lb 9.6 oz (80.1 kg)  06/05/19 173 lb (78.5 kg)      Physical Exam Vitals and nursing  note reviewed.  Constitutional:      Appearance: Normal appearance. She is not ill-appearing.  Neck:     Thyroid: No thyroid mass or thyromegaly.  Cardiovascular:     Rate and Rhythm: Normal rate. Rhythm irregular.     Pulses: Normal pulses.     Heart sounds: Murmur (4/6 systolic best at apex) heard.    Pulmonary:     Effort: Pulmonary effort is normal. No respiratory distress.     Breath sounds: Normal breath sounds. No wheezing, rhonchi or rales.  Musculoskeletal:     Right lower leg: No edema.     Left lower leg: No edema.  Neurological:     Mental Status: She is alert.  Psychiatric:        Mood and Affect: Mood normal.        Behavior: Behavior normal.       Results for orders placed or performed during the hospital encounter of 06/20/20  SARS CORONAVIRUS 2 (TAT 6-24 HRS) Nasopharyngeal Nasopharyngeal Swab   Specimen: Nasopharyngeal Swab  Result Value Ref Range   SARS Coronavirus 2 NEGATIVE NEGATIVE   Assessment & Plan:  This visit occurred during the SARS-CoV-2 public health emergency.  Safety protocols were in place, including screening questions prior to the visit, additional usage of staff PPE, and extensive cleaning of exam room while observing appropriate contact time as indicated for disinfecting solutions.   Problem List Items Addressed This Visit    Persistent atrial fibrillation [facility])    Appreciate cards care. Irregularly irregular today, continues eliquis and metoprolol.       Mitral valve regurgitation   HTN (hypertension), malignant - Primary    Chronic, deteriorated. She attributes to increased family stress (interaction yesterday with aunt). I have asked her to start monitoring blood pressures at home over the next week and let me or cardiology know if consistently elevated to consider titrating med (?metoprolol).       Grade III diastolic dysfunction    Overall improved. Will drop lasix back to 20mg  dose monitoring for recurrent symptoms and let us  know if these develop.       Cough    This has resolved. ?viral bronchitis vs other.           No orders of the defined types were placed in this encounter.  No orders of the defined types were placed in this encounter.   Patient Instructions  Start monitoring blood pressures at home over the next week. If consistently >140/90, let me or Dr Gwenlyn Found know to consider increasing BP med dose.  Ok to drop lasix back to 20mg  daily.  I'm glad cough is improving.  Schedule physical at next available appointment.   Follow up plan: Return for annual exam, prior fasting for blood work, medicare wellness visit.  Ria Bush,  MD

## 2020-06-28 NOTE — Patient Instructions (Addendum)
Start monitoring blood pressures at home over the next week. If consistently >140/90, let me or Dr Gwenlyn Found know to consider increasing BP med dose.  Ok to drop lasix back to 20mg  daily.  I'm glad cough is improving.  Schedule physical at next available appointment.

## 2020-06-28 NOTE — Assessment & Plan Note (Signed)
Chronic, deteriorated. She attributes to increased family stress (interaction yesterday with aunt). I have asked her to start monitoring blood pressures at home over the next week and let me or cardiology know if consistently elevated to consider titrating med (?metoprolol).

## 2020-06-28 NOTE — Assessment & Plan Note (Signed)
This has resolved. ?viral bronchitis vs other.

## 2020-06-28 NOTE — Assessment & Plan Note (Signed)
Overall improved. Will drop lasix back to 20mg  dose monitoring for recurrent symptoms and let us know if these develop.

## 2020-07-15 ENCOUNTER — Other Ambulatory Visit: Payer: Self-pay | Admitting: Cardiovascular Disease

## 2020-07-15 ENCOUNTER — Other Ambulatory Visit: Payer: Self-pay | Admitting: Family Medicine

## 2020-07-16 DIAGNOSIS — Z20822 Contact with and (suspected) exposure to covid-19: Secondary | ICD-10-CM | POA: Diagnosis not present

## 2020-07-24 ENCOUNTER — Other Ambulatory Visit: Payer: Self-pay | Admitting: Family Medicine

## 2020-09-04 ENCOUNTER — Other Ambulatory Visit: Payer: Self-pay

## 2020-09-04 ENCOUNTER — Ambulatory Visit (INDEPENDENT_AMBULATORY_CARE_PROVIDER_SITE_OTHER): Payer: Medicare HMO

## 2020-09-04 DIAGNOSIS — Z0000 Encounter for general adult medical examination without abnormal findings: Secondary | ICD-10-CM

## 2020-09-04 NOTE — Patient Instructions (Signed)
Joanna Hood , Thank you for taking time to come for your Medicare Wellness Visit. I appreciate your ongoing commitment to your health goals. Please review the following plan we discussed and let me know if I can assist you in the future.   Screening recommendations/referrals: Colonoscopy: Up to date, completed 11/22/2015, due 11/2025 Mammogram: Up to date, completed 12/21/2019, due 12/2020 Bone Density: Up to date, completed 09/01/2019, due 08/2021 Recommended yearly ophthalmology/optometry visit for glaucoma screening and checkup Recommended yearly dental visit for hygiene and checkup  Vaccinations: Influenza vaccine: Up to date, completed 03/26/2020, due 01/2021 Pneumococcal vaccine: Completed series Tdap vaccine: Up to date, completed 11/01/2017, due 10/2027 Shingles vaccine: due, check with your insurance regarding coverage if interested    Covid-19:Completed series  Advanced directives: Advance directive discussed with you today. Even though you declined this today please call our office should you change your mind and we can give you the proper paperwork for you to fill out.   Conditions/risks identified: hypertension   Next appointment: Follow up in one year for your annual wellness visit    Preventive Care 58 Years and Older, Female Preventive care refers to lifestyle choices and visits with your health care provider that can promote health and wellness. What does preventive care include?  A yearly physical exam. This is also called an annual well check.  Dental exams once or twice a year.  Routine eye exams. Ask your health care provider how often you should have your eyes checked.  Personal lifestyle choices, including:  Daily care of your teeth and gums.  Regular physical activity.  Eating a healthy diet.  Avoiding tobacco and drug use.  Limiting alcohol use.  Practicing safe sex.  Taking low-dose aspirin every day.  Taking vitamin and mineral supplements as  recommended by your health care provider. What happens during an annual well check? The services and screenings done by your health care provider during your annual well check will depend on your age, overall health, lifestyle risk factors, and family history of disease. Counseling  Your health care provider may ask you questions about your:  Alcohol use.  Tobacco use.  Drug use.  Emotional well-being.  Home and relationship well-being.  Sexual activity.  Eating habits.  History of falls.  Memory and ability to understand (cognition).  Work and work Statistician.  Reproductive health. Screening  You may have the following tests or measurements:  Height, weight, and BMI.  Blood pressure.  Lipid and cholesterol levels. These may be checked every 5 years, or more frequently if you are over 56 years old.  Skin check.  Lung cancer screening. You may have this screening every year starting at age 68 if you have a 30-pack-year history of smoking and currently smoke or have quit within the past 15 years.  Fecal occult blood test (FOBT) of the stool. You may have this test every year starting at age 60.  Flexible sigmoidoscopy or colonoscopy. You may have a sigmoidoscopy every 5 years or a colonoscopy every 10 years starting at age 39.  Hepatitis C blood test.  Hepatitis B blood test.  Sexually transmitted disease (STD) testing.  Diabetes screening. This is done by checking your blood sugar (glucose) after you have not eaten for a while (fasting). You may have this done every 1-3 years.  Bone density scan. This is done to screen for osteoporosis. You may have this done starting at age 20.  Mammogram. This may be done every 1-2 years. Talk to  your health care provider about how often you should have regular mammograms. Talk with your health care provider about your test results, treatment options, and if necessary, the need for more tests. Vaccines  Your health care  provider may recommend certain vaccines, such as:  Influenza vaccine. This is recommended every year.  Tetanus, diphtheria, and acellular pertussis (Tdap, Td) vaccine. You may need a Td booster every 10 years.  Zoster vaccine. You may need this after age 32.  Pneumococcal 13-valent conjugate (PCV13) vaccine. One dose is recommended after age 71.  Pneumococcal polysaccharide (PPSV23) vaccine. One dose is recommended after age 67. Talk to your health care provider about which screenings and vaccines you need and how often you need them. This information is not intended to replace advice given to you by your health care provider. Make sure you discuss any questions you have with your health care provider. Document Released: 07/05/2015 Document Revised: 02/26/2016 Document Reviewed: 04/09/2015 Elsevier Interactive Patient Education  2017 [employer] Prevention in the Home Falls can cause injuries. They can happen to people of all ages. There are many things you can do to make your home safe and to help prevent falls. What can I do on the outside of my home?  Regularly fix the edges of walkways and driveways and fix any cracks.  Remove anything that might make you trip as you walk through a door, such as a raised step or threshold.  Trim any bushes or trees on the path to your home.  Use bright outdoor lighting.  Clear any walking paths of anything that might make someone trip, such as rocks or tools.  Regularly check to see if handrails are loose or broken. Make sure that both sides of any steps have handrails.  Any raised decks and porches should have guardrails on the edges.  Have any leaves, snow, or ice cleared regularly.  Use sand or salt on walking paths during winter.  Clean up any spills in your garage right away. This includes oil or grease spills. What can I do in the bathroom?  Use night lights.  Install grab bars by the toilet and in the tub and shower. Do  not use towel bars as grab bars.  Use non-skid mats or decals in the tub or shower.  If you need to sit down in the shower, use a plastic, non-slip stool.  Keep the floor dry. Clean up any water that spills on the floor as soon as it happens.  Remove soap buildup in the tub or shower regularly.  Attach bath mats securely with double-sided non-slip rug tape.  Do not have throw rugs and other things on the floor that can make you trip. What can I do in the bedroom?  Use night lights.  Make sure that you have a light by your bed that is easy to reach.  Do not use any sheets or blankets that are too big for your bed. They should not hang down onto the floor.  Have a firm chair that has side arms. You can use this for support while you get dressed.  Do not have throw rugs and other things on the floor that can make you trip. What can I do in the kitchen?  Clean up any spills right away.  Avoid walking on wet floors.  Keep items that you use a lot in easy-to-reach places.  If you need to reach something above you, use a strong step stool that has  a grab bar.  Keep electrical cords out of the way.  Do not use floor polish or wax that makes floors slippery. If you must use wax, use non-skid floor wax.  Do not have throw rugs and other things on the floor that can make you trip. What can I do with my stairs?  Do not leave any items on the stairs.  Make sure that there are handrails on both sides of the stairs and use them. Fix handrails that are broken or loose. Make sure that handrails are as long as the stairways.  Check any carpeting to make sure that it is firmly attached to the stairs. Fix any carpet that is loose or worn.  Avoid having throw rugs at the top or bottom of the stairs. If you do have throw rugs, attach them to the floor with carpet tape.  Make sure that you have a light switch at the top of the stairs and the bottom of the stairs. If you do not have them,  ask someone to add them for you. What else can I do to help prevent falls?  Wear shoes that:  Do not have high heels.  Have rubber bottoms.  Are comfortable and fit you well.  Are closed at the toe. Do not wear sandals.  If you use a stepladder:  Make sure that it is fully opened. Do not climb a closed stepladder.  Make sure that both sides of the stepladder are locked into place.  Ask someone to hold it for you, if possible.  Clearly mark and make sure that you can see:  Any grab bars or handrails.  First and last steps.  Where the edge of each step is.  Use tools that help you move around (mobility aids) if they are needed. These include:  Canes.  Walkers.  Scooters.  Crutches.  Turn on the lights when you go into a dark area. Replace any light bulbs as soon as they burn out.  Set up your furniture so you have a clear path. Avoid moving your furniture around.  If any of your floors are uneven, fix them.  If there are any pets around you, be aware of where they are.  Review your medicines with your doctor. Some medicines can make you feel dizzy. This can increase your chance of falling. Ask your doctor what other things that you can do to help prevent falls. This information is not intended to replace advice given to you by your health care provider. Make sure you discuss any questions you have with your health care provider. Document Released: 04/04/2009 Document Revised: 11/14/2015 Document Reviewed: 07/13/2014 Elsevier Interactive Patient Education  2017 [employer].

## 2020-09-04 NOTE — Progress Notes (Signed)
PCP notes:  Health Maintenance: No gaps noted   Abnormal Screenings: none   Patient concerns: Ongoing shortness of breath and slight cough. Patient states that this is chronic and she is being followed by cardiology for this also.    Nurse concerns: none   Next PCP appt.: 09/17/2020 @ 9:30 am

## 2020-09-04 NOTE — Progress Notes (Signed)
Subjective:   Joanna Hood is a 75 y.o. female who presents for Medicare Annual (Subsequent) preventive examination.  Review of Systems: N/A      I connected with the patient today by telephone and verified that I am speaking with the correct person using two identifiers. Location patient: home Location nurse: work Persons participating in the telephone visit: patient, nurse.   I discussed the limitations, risks, security and privacy concerns of performing an evaluation and management service by telephone and the availability of in person appointments. I also discussed with the patient that there may be a patient responsible charge related to this service. The patient expressed understanding and verbally consented to this telephonic visit.        Cardiac Risk Factors include: advanced age (>59mn, >>38women);hypertension     Objective:    Today's Vitals   There is no height or weight on file to calculate BMI.  Advanced Directives 09/04/2020 06/02/2019 05/26/2018 04/23/2016 09/22/2015 05/01/2013 04/29/2013  Does Patient Have a Medical Advance Directive? No No No No No Patient does not have advance directive;Patient would like information Patient does not have advance directive;Patient would like information  Does patient want to make changes to medical advance directive? No - Patient declined - - - - - -  Would patient like information on creating a medical advance directive? - No - Patient declined No - Patient declined No - patient declined information - Advance directive packet given Advance directive packet given  Pre-existing out of facility DNR order (yellow form or pink MOST form) - - - - - No -    Current Medications (verified) Outpatient Encounter Medications as of 09/04/2020  Medication Sig  . amLODipine (NORVASC) 10 MG tablet Take 1 tablet (10 mg total) by mouth daily.  . benzonatate (TESSALON) 200 MG capsule Take 1 capsule (200 mg total) by mouth 3 (three) times  daily as needed for cough.  . Calcium Carb-Cholecalciferol (CALCIUM-VITAMIN D) 600-400 MG-UNIT TABS Take 1 tablet by mouth daily.  . cholecalciferol (VITAMIN D) 1000 UNITS tablet Take 1,000 Units by mouth daily.  .Marland KitchenELIQUIS 5 MG TABS tablet TAKE 1 TABLET TWICE DAILY  . fluticasone (FLONASE) 50 MCG/ACT nasal spray Place 2 sprays into both nostrils daily.  . furosemide (LASIX) 20 MG tablet TAKE 1 TABLET EVERY DAY  . levothyroxine (SYNTHROID) 125 MCG tablet TAKE 1 TABLET EVERY DAY BEFORE BREAKFAST  . meclizine (ANTIVERT) 25 MG tablet Take 1 tablet (25 mg total) by mouth 3 (three) times daily as needed. For vertigo  . methotrexate (RHEUMATREX) 2.5 MG tablet Take 15 mg by mouth once a week. Caution:Chemotherapy. Protect from light.  . metoprolol tartrate (LOPRESSOR) 25 MG tablet TAKE 1 TABLET TWICE DAILY  . pantoprazole (PROTONIX) 40 MG tablet TAKE 1 TABLET EVERY MONDAY, WEDNESDAY, AND FRIDAY.  .Marland Kitchenpotassium chloride SA (KLOR-CON) 20 MEQ tablet TAKE 1 TABLET EVERY DAY  . ramipril (ALTACE) 10 MG capsule TAKE 1 CAPSULE TWICE DAILY   No facility-administered encounter medications on file as of 09/04/2020.    Allergies (verified) Metronidazole   History: Past Medical History:  Diagnosis Date  . [place]   persistent  . Chronic diastolic CHF (congestive heart failure) (HCC)    HTN  . COVID-19 virus infection 01/20/2019  . ESOPHAGEAL STRICTURE 02/19/2005   Qualifier: Diagnosis of  By: MAmil AmenMD, EBenjamine Mola   . Goiter   . Heart murmur   . History of anemia    unclear  cause, now resolved  . History of chicken pox   . History of colon polyps    benign  . HLD (hyperlipidemia)   . Hypothyroidism   . Malignant hypertension longstanding  . Microcytosis   . Osteopenia 12/2013   T -2.3 hip  . Osteoporosis   . Rheumatoid arthritis ([place]) 11/2015   +RF, +CCP, ESR 49, synovitis on exam and [place] 11/2015 [facility])  . Right lower lobe pneumonia 07/24/2014  . SOB (shortness of  breath) 04/29/2013  . Vitamin D deficiency    Past Surgical History:  Procedure Laterality Date  . CARDIAC CATHETERIZATION  2014   normal per patient  . COLONOSCOPY  11/2015   TA, int hem, rpt 5 yrs [facility])  . LEFT HEART CATHETERIZATION WITH CORONARY ANGIOGRAM N/A 05/01/2013   Procedure: LEFT HEART CATHETERIZATION WITH CORONARY ANGIOGRAM;  Surgeon: Leonie Man, MD;  [facility] C[facility] LAB;  Service: Cardiovascular;  Laterality: N/A;  . PARTIAL HYSTERECTOMY  1970   fibroids   Family History  Problem Relation Age of Onset  . Cancer Mother        ovarian  . Sudden death Mother   . Diabetes Sister   . Cancer Maternal Grandmother        lung  . Heart disease Maternal Grandmother   . Hypertension Sister   . Hypertension Cousin   . CAD Neg Hx   . Stroke Neg Hx    Social History   Socioeconomic History  . Marital status: Single    Spouse name: Not on file  . Number of children: 1  . Years of education: Not on file  . Highest education level: Not on file  Occupational History  . Occupation: Retired  Tobacco Use  . Smoking status: Former Smoker    Quit date: 05/16/1969    Years since quitting: 51.3  . Smokeless tobacco: Former Network engineer  . Vaping Use: Never used  Substance and Sexual Activity  . Alcohol use: No    Alcohol/week: 0.0 standard drinks  . Drug use: No  . Sexual activity: Never  Other Topics Concern  . 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 Difficulty of Paying Living Expenses: Not hard at all  Food Insecurity: No Food Insecurity  . Worried About Charity fundraiser in the Last Year: Never true  . Ran Out of Food in the Last Year: Never true  Transportation Needs: No Transportation Needs  . Lack of Transportation (Medical): No  . Lack  of Transportation (Non-Medical): No  Physical Activity: Inactive  . Days of Exercise per Week: 0 days  . Minutes of Exercise per Session: 0 min  Stress: No Stress Concern Present  . Feeling of Stress : Not at all  Social Connections: Not on file    Tobacco Counseling Counseling given: Not Answered   Clinical Intake:  Pre-visit preparation completed: Yes  Pain : No/denies pain     Nutritional Risks: None Diabetes: No  How often do you need to have someone help you when you read instructions, pamphlets, or other written materials from your doctor or pharmacy?: 1 - Never What is the last grade level you completed in school?: 1 year of college  Diabetic: No Nutrition Risk Assessment:  Has the  patient had any N/V/D within the last 2 months?  No  Does the patient have any non-healing wounds?  No  Has the patient had any unintentional weight loss or weight gain?  No   Diabetes:  Is the patient diabetic?  No  If diabetic, was a CBG obtained today?  N/A Did the patient bring in their glucometer from home?  N/A How often do you monitor your CBG's? N/A.   Financial Strains and Diabetes Management:  Are you having any financial strains with the device, your supplies or your medication? N/A.  Does the patient want to be seen by Chronic Care Management for management of their diabetes?  N/A Would the patient like to be referred to a Nutritionist or for Diabetic Management?  N/A    Interpreter Needed?: No  Information entered by :: CJohnson, LPN   Activities of Daily Living In your present state of health, do you have any difficulty performing the following activities: 09/04/2020  Hearing? N  Vision? N  Difficulty concentrating or making decisions? N  Walking or climbing stairs? N  Dressing or bathing? N  Doing errands, shopping? N  Preparing Food and eating ? N  Using the Toilet? N  In the past six months, have you accidently leaked urine? N  Do you have problems  with loss of bowel control? N  Managing your Medications? N  Managing your Finances? N  Housekeeping or managing your Housekeeping? N  Some recent data might be hidden    Patient Care Team: Ria Bush, MD as PCP - General (Family Medicine) Lorretta Harp, MD as Consulting Physician (Cardiology) Gavin Pound, MD as Consulting Physician (Rheumatology) Shirl Harris, OD as Consulting Physician (Optometry)  Indicate any recent Medical Services you may have received from other than [facility] providers in the past year (date may be approximate).     Assessment:   This is a routine wellness examination for Xolani.  Hearing/Vision screen  Hearing Screening   125Hz  250Hz  500Hz  1000Hz  2000Hz  3000Hz  4000Hz  6000Hz  8000Hz   Right ear:           Left ear:           Vision Screening Comments: Patient gets annual eye exams   Dietary issues and exercise activities discussed: Current Exercise Habits: The patient does not participate in regular exercise at present, Exercise limited by: None identified  Goals    . DIET - INCREASE WATER INTAKE     Starting 05/26/2018, I will continue to drink at least 6-8 glasses of water daily.     . Patient Stated     06/02/2019, I will try to lose some weight in the future.     . Patient Stated     09/04/2020, I will maintain and continue medications as prescribed.       Depression Screen PHQ 2/9 Scores 09/04/2020 06/28/2020 06/02/2019 05/26/2018 05/20/2017 04/23/2016 04/23/2015  PHQ - 2 Score 0 0 0 0 0 0 0  PHQ- 9 Score 0 - 0 0 - - -    Fall Risk Fall Risk  09/04/2020 06/28/2020 06/02/2019 05/26/2018 05/20/2017  Falls in the past year? 0 0 0 0 No  Number falls in past yr: 0 0 0 - -  Injury with Fall? 0 0 0 - -  Risk for fall due to : Medication side effect - Medication side effect - -  Follow up Falls evaluation completed;Falls prevention discussed Falls evaluation completed Falls evaluation completed;Falls prevention discussed - -  FALL RISK  PREVENTION PERTAINING TO THE HOME:  Any stairs in or around the home? Yes  If so, are there any without handrails? No  Home free of loose throw rugs in walkways, pet beds, electrical cords, etc? Yes  Adequate lighting in your home to reduce risk of falls? Yes   ASSISTIVE DEVICES UTILIZED TO PREVENT FALLS:  Life alert? No  Use of a cane, walker or w/c? No  Grab bars in the bathroom? No  Shower chair or bench in shower? No  Elevated toilet seat or a handicapped toilet? No   TIMED UP AND GO:  Was the test performed? N/A telephone visit .  Cognitive Function: MMSE - Mini Mental State Exam 09/04/2020 06/02/2019 05/26/2018 04/23/2016  Orientation to time 5 5 5 5   Orientation to Place 5 5 5 5   Registration 3 3 3 3   Attention/ Calculation 5 5 0 0  Recall 3 3 3 3   Language- name 2 objects - - 0 0  Language- repeat 1 1 1 1   Language- follow 3 step command - - 3 3  Language- read & follow direction - - 0 0  Write a sentence - - 0 0  Copy design - - 0 0  Total score - - 20 20  Mini Cog  Mini-Cog screen was completed. Maximum score is 22. A value of 0 denotes this part of the MMSE was not completed or the patient failed this part of the Mini-Cog screening.       Immunizations Immunization History  Administered Date(s) Administered  . Influenza Whole 07/05/2009, 05/14/2010  . Influenza, High Dose Seasonal PF 04/03/2014, 03/17/2017, 04/05/2018, 04/04/2019  . Influenza,inj,Quad PF,6+ Mos 04/30/2013, 04/23/2016  . Influenza-Unspecified 04/12/2015, 03/26/2020  . PFIZER(Purple Top)SARS-COV-2 Vaccination 08/18/2019, 09/12/2019, 04/01/2020  . Pneumococcal Conjugate-13 04/23/2015  . Pneumococcal Polysaccharide-23 04/30/2013  . Td 06/23/2003  . Tdap 04/23/2016, 11/01/2017    TDAP status: Up to date  Flu Vaccine status: Up to date  Pneumococcal vaccine status: Up to date  Covid-19 vaccine status: Completed vaccines  Qualifies for Shingles Vaccine? Yes   Zostavax completed No    Shingrix Completed?: No.    Education has been provided regarding the importance of this vaccine. Patient has been advised to call insurance company to determine out of pocket expense if they have not yet received this vaccine. Advised may also receive vaccine at local pharmacy or Health Dept. Verbalized acceptance and understanding.  Screening Tests Health Maintenance  Topic Date Due  . MAMMOGRAM  12/20/2020  . COLONOSCOPY (Pts 45-42yr Insurance coverage will need to be confirmed)  11/21/2025  . TETANUS/TDAP  11/02/2027  . INFLUENZA VACCINE  Completed  . DEXA SCAN  Completed  . COVID-19 Vaccine  Completed  . Hepatitis C Screening  Completed  . PNA vac Low Risk Adult  Completed  . HPV VACCINES  Aged Out    Health Maintenance  There are no preventive care reminders to display for this patient.  Colorectal cancer screening: Type of screening: Colonoscopy. Completed 11/22/2015. Repeat every 10 years  Mammogram status: Completed 12/21/2019. Repeat every year  Bone Density status: Completed 09/01/2019. Results reflect: Bone density results: OSTEOPENIA. Repeat every 2 years.  Lung Cancer Screening: (Low Dose CT Chest recommended if Age 74-80years, 30 pack-year currently smoking OR have quit w/in 15 years.) does not qualify.    Additional Screening:  Hepatitis C Screening: does qualify; Completed 04/23/2016  Vision Screening: Recommended annual ophthalmology exams for early detection of glaucoma and other  disorders of the eye. Is the patient up to date with their annual eye exam?  Yes  Who is the provider or what is the name of the office in which the patient attends annual eye exams? [facility]  If pt is not established with a provider, would they like to be referred to a provider to establish care? No .   Dental Screening: Recommended annual dental exams for proper oral hygiene  Community Resource Referral / Chronic Care Management: CRR required this visit?  No   CCM required  this visit?  No      Plan:     I have personally reviewed and noted the following in the patient's chart:   . Medical and social history . Use of alcohol, tobacco or illicit drugs  . Current medications and supplements . Functional ability and status . Nutritional status . Physical activity . Advanced directives . List of other physicians . Hospitalizations, surgeries, and ER visits in previous 12 months . Vitals . Screenings to include cognitive, depression, and falls . Referrals and appointments  In addition, I have reviewed and discussed with patient certain preventive protocols, quality metrics, and best practice recommendations. A written personalized care plan for preventive services as well as general preventive health recommendations were provided to patient.   Due to this being a telephonic visit, the after visit summary with patients personalized plan was offered to patient via office or my-chart. Patient preferred to pick up at office at next visit or via mychart.   Andrez Grime, LPN   5/00/1642

## 2020-09-08 ENCOUNTER — Other Ambulatory Visit: Payer: Self-pay | Admitting: Family Medicine

## 2020-09-08 DIAGNOSIS — I4819 Other persistent atrial fibrillation: Secondary | ICD-10-CM

## 2020-09-08 DIAGNOSIS — E559 Vitamin D deficiency, unspecified: Secondary | ICD-10-CM

## 2020-09-08 DIAGNOSIS — I10 Essential (primary) hypertension: Secondary | ICD-10-CM

## 2020-09-08 DIAGNOSIS — E039 Hypothyroidism, unspecified: Secondary | ICD-10-CM

## 2020-09-10 ENCOUNTER — Other Ambulatory Visit (INDEPENDENT_AMBULATORY_CARE_PROVIDER_SITE_OTHER): Payer: Medicare HMO

## 2020-09-10 ENCOUNTER — Other Ambulatory Visit: Payer: Self-pay

## 2020-09-10 DIAGNOSIS — I4819 Other persistent atrial fibrillation: Secondary | ICD-10-CM

## 2020-09-10 DIAGNOSIS — E039 Hypothyroidism, unspecified: Secondary | ICD-10-CM | POA: Diagnosis not present

## 2020-09-10 DIAGNOSIS — E559 Vitamin D deficiency, unspecified: Secondary | ICD-10-CM

## 2020-09-10 DIAGNOSIS — I10 Essential (primary) hypertension: Secondary | ICD-10-CM

## 2020-09-10 LAB — CBC WITH DIFFERENTIAL/PLATELET
Basophils Absolute: 0.1 10*3/uL (ref 0.0–0.1)
Basophils Relative: 1.9 % (ref 0.0–3.0)
Eosinophils Absolute: 0.1 10*3/uL (ref 0.0–0.7)
Eosinophils Relative: 1.4 % (ref 0.0–5.0)
HCT: 34.2 % — ABNORMAL LOW (ref 36.0–46.0)
Hemoglobin: 11 g/dL — ABNORMAL LOW (ref 12.0–15.0)
Lymphocytes Relative: 17.9 % (ref 12.0–46.0)
Lymphs Abs: 0.8 10*3/uL (ref 0.7–4.0)
MCHC: 32.3 g/dL (ref 30.0–36.0)
MCV: 83.7 fl (ref 78.0–100.0)
Monocytes Absolute: 0.4 10*3/uL (ref 0.1–1.0)
Monocytes Relative: 9.7 % (ref 3.0–12.0)
Neutro Abs: 3.1 10*3/uL (ref 1.4–7.7)
Neutrophils Relative %: 69.1 % (ref 43.0–77.0)
Platelets: 145 10*3/uL — ABNORMAL LOW (ref 150.0–400.0)
RBC: 4.08 Mil/uL (ref 3.87–5.11)
RDW: 16.7 % — ABNORMAL HIGH (ref 11.5–15.5)
WBC: 4.5 10*3/uL (ref 4.0–10.5)

## 2020-09-10 LAB — COMPREHENSIVE METABOLIC PANEL
ALT: 30 U/L (ref 0–35)
AST: 33 U/L (ref 0–37)
Albumin: 3.8 g/dL (ref 3.5–5.2)
Alkaline Phosphatase: 76 U/L (ref 39–117)
BUN: 24 mg/dL — ABNORMAL HIGH (ref 6–23)
CO2: 28 mEq/L (ref 19–32)
Calcium: 9.3 mg/dL (ref 8.4–10.5)
Chloride: 104 mEq/L (ref 96–112)
Creatinine, Ser: 1.31 mg/dL — ABNORMAL HIGH (ref 0.40–1.20)
GFR: 40.49 mL/min — ABNORMAL LOW (ref >60.00)
Glucose, Bld: 96 mg/dL (ref 70–99)
Potassium: 4.3 mEq/L (ref 3.5–5.1)
Sodium: 140 mEq/L (ref 135–145)
Total Bilirubin: 1.5 mg/dL — ABNORMAL HIGH (ref 0.2–1.2)
Total Protein: 6.5 g/dL (ref 6.0–8.3)

## 2020-09-10 LAB — MICROALBUMIN / CREATININE URINE RATIO
Creatinine,U: 145.7 mg/dL
Microalb Creat Ratio: 25.6 mg/g (ref 0.0–30.0)
Microalb, Ur: 37.3 mg/dL — ABNORMAL HIGH (ref 0.0–1.9)

## 2020-09-10 LAB — LIPID PANEL
Cholesterol: 144 mg/dL (ref 0–200)
HDL: 32.6 mg/dL — ABNORMAL LOW (ref >39.00)
LDL Cholesterol: 96 mg/dL (ref 0–99)
NonHDL: 111.88
Total CHOL/HDL Ratio: 4
Triglycerides: 81 mg/dL (ref 0.0–149.0)
VLDL: 16.2 mg/dL (ref 0.0–40.0)

## 2020-09-10 LAB — VITAMIN D 25 HYDROXY (VIT D DEFICIENCY, FRACTURES): VITD: 46.38 ng/mL (ref 30.00–100.00)

## 2020-09-10 LAB — TSH: TSH: 0.64 u[IU]/mL (ref 0.35–4.50)

## 2020-09-10 LAB — T4, FREE: Free T4: 1.73 ng/dL — ABNORMAL HIGH (ref 0.60–1.60)

## 2020-09-17 ENCOUNTER — Encounter: Payer: Self-pay | Admitting: Family Medicine

## 2020-09-17 ENCOUNTER — Ambulatory Visit (INDEPENDENT_AMBULATORY_CARE_PROVIDER_SITE_OTHER): Payer: Medicare HMO | Admitting: Family Medicine

## 2020-09-17 ENCOUNTER — Other Ambulatory Visit: Payer: Self-pay

## 2020-09-17 VITALS — BP 152/100 | HR 73 | Temp 97.4°F | Ht 62.0 in | Wt 164.4 lb

## 2020-09-17 DIAGNOSIS — N183 Chronic kidney disease, stage 3 unspecified: Secondary | ICD-10-CM | POA: Insufficient documentation

## 2020-09-17 DIAGNOSIS — D509 Iron deficiency anemia, unspecified: Secondary | ICD-10-CM | POA: Diagnosis not present

## 2020-09-17 DIAGNOSIS — I340 Nonrheumatic mitral (valve) insufficiency: Secondary | ICD-10-CM

## 2020-09-17 DIAGNOSIS — M0579 Rheumatoid arthritis with rheumatoid factor of multiple sites without organ or systems involvement: Secondary | ICD-10-CM | POA: Diagnosis not present

## 2020-09-17 DIAGNOSIS — Z0000 Encounter for general adult medical examination without abnormal findings: Secondary | ICD-10-CM

## 2020-09-17 DIAGNOSIS — R6881 Early satiety: Secondary | ICD-10-CM

## 2020-09-17 DIAGNOSIS — E039 Hypothyroidism, unspecified: Secondary | ICD-10-CM

## 2020-09-17 DIAGNOSIS — I10 Essential (primary) hypertension: Secondary | ICD-10-CM

## 2020-09-17 DIAGNOSIS — I4819 Other persistent atrial fibrillation: Secondary | ICD-10-CM | POA: Diagnosis not present

## 2020-09-17 DIAGNOSIS — M85851 Other specified disorders of bone density and structure, right thigh: Secondary | ICD-10-CM | POA: Diagnosis not present

## 2020-09-17 DIAGNOSIS — E559 Vitamin D deficiency, unspecified: Secondary | ICD-10-CM | POA: Diagnosis not present

## 2020-09-17 DIAGNOSIS — I5189 Other ill-defined heart diseases: Secondary | ICD-10-CM

## 2020-09-17 DIAGNOSIS — N289 Disorder of kidney and ureter, unspecified: Secondary | ICD-10-CM | POA: Insufficient documentation

## 2020-09-17 DIAGNOSIS — Z86010 Personal history of colonic polyps: Secondary | ICD-10-CM | POA: Diagnosis not present

## 2020-09-17 DIAGNOSIS — R634 Abnormal weight loss: Secondary | ICD-10-CM

## 2020-09-17 LAB — POC URINALSYSI DIPSTICK (AUTOMATED)
Glucose, UA: NEGATIVE
Ketones, UA: NEGATIVE
Leukocytes, UA: NEGATIVE
Nitrite, UA: NEGATIVE
Protein, UA: POSITIVE — AB
Spec Grav, UA: 1.02 (ref 1.010–1.025)
Urobilinogen, UA: 1 E.U./dL
pH, UA: 6.5 (ref 5.0–8.0)

## 2020-09-17 MED ORDER — LEVOTHYROXINE SODIUM 125 MCG PO TABS: 125.0000 ug | ORAL_TABLET | Freq: Every day | ORAL | 3 refills | Status: DC

## 2020-09-17 MED ORDER — AMLODIPINE BESYLATE 10 MG PO TABS: 10.0000 mg | ORAL_TABLET | Freq: Every day | ORAL | 3 refills | Status: DC

## 2020-09-17 MED ORDER — METOPROLOL TARTRATE 37.5 MG PO TABS: 37.5000 mg | ORAL_TABLET | Freq: Two times a day (BID) | ORAL | 1 refills | Status: DC

## 2020-09-17 MED ORDER — PANTOPRAZOLE SODIUM 40 MG PO TBEC: 40.0000 mg | DELAYED_RELEASE_TABLET | Freq: Every day | ORAL | 3 refills | Status: DC | PRN

## 2020-09-17 NOTE — Assessment & Plan Note (Signed)
Ongoing weight loss noted, associated with early satiety and new renal insuff. Check abd [place], if pancreas not visualized, check CT abdomen.

## 2020-09-17 NOTE — Assessment & Plan Note (Signed)
Preventative protocols reviewed and updated unless pt declined. Discussed healthy diet and lifestyle.  

## 2020-09-17 NOTE — Assessment & Plan Note (Signed)
Appreciate rheum care - on MTX

## 2020-09-17 NOTE — Addendum Note (Signed)
Addended by: Brenton Grills on: 8/62/8241 75:30 PM   Modules accepted: Orders

## 2020-09-17 NOTE — Assessment & Plan Note (Signed)
Reviewed calcium, vit D intake as well as regular weight bearing exercise.

## 2020-09-17 NOTE — Patient Instructions (Addendum)
Kidney function was a bit impaired on testing today. Check urinalysis today and we will also schedule abdominal ultrasound to further evaluate ongoing weight loss.  Blood pressures are staying elevated - increase metoprolol to 37.5mg  twice daily, monitoring for low heart rate.  Return in 3 months for follow up visit.   Health Maintenance After Age 74 After age 43, you are at a higher risk for certain long-term diseases and infections as well as injuries from falls. Falls are a major cause of broken bones and head injuries in people who are older than age 73. Getting regular preventive care can help to keep you healthy and well. Preventive care includes getting regular testing and making lifestyle changes as recommended by your health care provider. Talk with your health care provider about:  Which screenings and tests you should have. A screening is a test that checks for a disease when you have no symptoms.  A diet and exercise plan that is right for you. What should I know about screenings and tests to prevent falls? Screening and testing are the best ways to find a health problem early. Early diagnosis and treatment give you the best chance of managing medical conditions that are common after age 31. Certain conditions and lifestyle choices may make you more likely to have a fall. Your health care provider may recommend:  Regular vision checks. Poor vision and conditions such as cataracts can make you more likely to have a fall. If you wear glasses, make sure to get your prescription updated if your vision changes.  Medicine review. Work with your health care provider to regularly review all of the medicines you are taking, including over-the-counter medicines. Ask your health care provider about any side effects that may make you more likely to have a fall. Tell your health care provider if any medicines that you take make you feel dizzy or sleepy.  Osteoporosis screening. Osteoporosis is a  condition that causes the bones to get weaker. This can make the bones weak and cause them to break more easily.  Blood pressure screening. Blood pressure changes and medicines to control blood pressure can make you feel dizzy.  Strength and balance checks. Your health care provider may recommend certain tests to check your strength and balance while standing, walking, or changing positions.  Foot health exam. Foot pain and numbness, as well as not wearing proper footwear, can make you more likely to have a fall.  Depression screening. You may be more likely to have a fall if you have a fear of falling, feel emotionally low, or feel unable to do activities that you used to do.  Alcohol use screening. Using too much alcohol can affect your balance and may make you more likely to have a fall. What actions can I take to lower my risk of falls? General instructions  Talk with your health care provider about your risks for falling. Tell your health care provider if: ? You fall. Be sure to tell your health care provider about all falls, even ones that seem minor. ? You feel dizzy, sleepy, or off-balance.  Take over-the-counter and prescription medicines only as told by your health care provider. These include any supplements.  Eat a healthy diet and maintain a healthy weight. A healthy diet includes low-fat dairy products, low-fat (lean) meats, and fiber from whole grains, beans, and lots of fruits and vegetables. Home safety  Remove any tripping hazards, such as rugs, cords, and clutter.  Install safety equipment such  as grab bars in bathrooms and safety rails on stairs.  Keep rooms and walkways well-lit. Activity  Follow a regular exercise program to stay fit. This will help you maintain your balance. Ask your health care provider what types of exercise are appropriate for you.  If you need a cane or walker, use it as recommended by your health care provider.  Wear supportive shoes that  have nonskid soles.   Lifestyle  Do not drink alcohol if your health care provider tells you not to drink.  If you drink alcohol, limit how much you have: ? 0-1 drink a day for women. ? 0-2 drinks a day for men.  Be aware of how much alcohol is in your drink. In the [place] one drink equals one typical bottle of beer (12 oz), one-half glass of wine (5 oz), or one shot of hard liquor (1 oz).  Do not use any products that contain nicotine or tobacco, such as cigarettes and e-cigarettes. If you need help quitting, ask your health care provider. Summary  Having a healthy lifestyle and getting preventive care can help to protect your health and wellness after age 61.  Screening and testing are the best way to find a health problem early and help you avoid having a fall. Early diagnosis and treatment give you the best chance for managing medical conditions that are more common for people who are older than age 12.  Falls are a major cause of broken bones and head injuries in people who are older than age 63. Take precautions to prevent a fall at home.  Work with your health care provider to learn what changes you can make to improve your health and wellness and to prevent falls. This information is not intended to replace advice given to you by your health care provider. Make sure you discuss any questions you have with your health care provider. Document Revised: 09/29/2018 Document Reviewed: 04/21/2017 Elsevier Patient Education  2021 [employer].

## 2020-09-17 NOTE — Assessment & Plan Note (Addendum)
Sees cardiology Gwenlyn Found).  Notes increasing chronic dyspnea. She will call to schedule sooner cards f/u appointment.

## 2020-09-17 NOTE — Assessment & Plan Note (Signed)
Stable period on daily replacement.

## 2020-09-17 NOTE — Assessment & Plan Note (Signed)
Chronic, stable period on levothyroxine replacement.

## 2020-09-17 NOTE — Assessment & Plan Note (Signed)
Will be due for rpt colonoscopy this summer

## 2020-09-17 NOTE — Assessment & Plan Note (Addendum)
Persistent mild normocytic anemia.  Will need iron panel and b12 checked next labwork.

## 2020-09-17 NOTE — Assessment & Plan Note (Signed)
Continues eliquis and metoprolol.

## 2020-09-17 NOTE — Assessment & Plan Note (Signed)
Newly noted. Recheck at next labs in 3 months.  Check abd [place] and UA today.

## 2020-09-17 NOTE — Progress Notes (Signed)
Patient ID: Joanna Hood, female    DOB: 01/25/1946, 75 y.o.   MRN: [id number]  This visit was conducted in person.  BP (!) 152/100   Pulse 73   Temp (!) 97.4 F (36.3 C) (Temporal)   Ht 5\' 2"  (1.575 m)   Wt 164 lb 7 oz (74.6 kg)   SpO2 92%   BMI 30.08 kg/m   BP Readings from Last 3 Encounters:  09/17/20 (!) 152/100  06/28/20 (!) 160/80  06/20/20 (S) (!) 197/98    CC: CPE Subjective:   HPI: Joanna Hood is a 75 y.o. female presenting on 09/17/2020 for Annual Exam (Prt 2.)   Saw health advisor last week for medicare wellness visit. Note reviewed. Ongoing chronic cough with mild dyspnea.   No exam data present  Flowsheet Row Clinical Support from 09/04/2020 in [facility] [place]  PHQ-2 Total Score 0      Fall Risk  09/04/2020 06/28/2020 06/02/2019 05/26/2018 05/20/2017  Falls in the past year? 0 0 0 0 No  Number falls in past yr: 0 0 0 - -  Injury with Fall? 0 0 0 - -  Risk for fall due to : Medication side effect - Medication side effect - -  Follow up Falls evaluation completed;Falls prevention discussed Falls evaluation completed Falls evaluation completed;Falls prevention discussed - -   Known h/o severe MVR, LVH and grade 3 diastolic dysfunction with severe LA enlargement. Known persistent afib on eliquis. Sees cards once yearly. On amlodipine, ramipril, metoprolol.   RA - on MTX 15mg  weekly sees rheum Q6 mo Trudie Reed) - upcoming appt next month. Symptoms very well controlled.   Continued weight loss noted (5 lbs in last 2 months, total 15 lbs in past year). Eating habits have changed - she has decreased appetite. Endorses some early satiety. No dysphagia, epigastric or lower abd pain , no flank pain. No boring pain to the back.   Preventative: COLONOSCOPY 11/2015-TA, int hem, rpt 5 yrs Henrene Pastor) - will be due this summer. Well woman exam - partial hysterectomy 1970, ovaries remain.All normal papsmears.No pelvic pain/pressure.   [facility] DEXA 12/2013 -T-2.3 osteopenia  DEXA 08/2019 - T -2.0 R hip, 0.7 spine - reviewed calcium/vitamin D dailyand regular walking.  Lung cancer screen - not eligible  Flu shot yearly  [place] 07/2019, 08/2019, booster 03/2020 Pneumovax 04/2013, [id number] Tdap 2017, 2019 Shingrix- not interested  Advanced directives: HCPOA would be daughter.Packet at home, encouraged she work on this. Seat belt use discussed Sunscreen use discussed.No changing moles on skin. 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 Interim medical history since our last visit reviewed. Allergies and medications reviewed and updated. Outpatient Medications Prior to Visit  Medication Sig Dispense Refill  . Calcium Carb-Cholecalciferol (CALCIUM-VITAMIN D) 600-400 MG-UNIT TABS Take 1 tablet by mouth daily.    . cholecalciferol (VITAMIN D) 1000 UNITS tablet Take 1,000 Units by mouth daily.    Marland Kitchen ELIQUIS 5 MG TABS tablet TAKE 1 TABLET TWICE DAILY 180 tablet 1  . fluticasone (FLONASE) 50 MCG/ACT nasal spray Place 2 sprays into both  nostrils daily. 16 g 0  . furosemide (LASIX) 20 MG tablet TAKE 1 TABLET EVERY DAY 90 tablet 1  . methotrexate (RHEUMATREX) 2.5 MG tablet Take 15 mg by mouth once a week. Caution:Chemotherapy. Protect from light.    . potassium chloride SA (KLOR-CON) 20 MEQ tablet TAKE 1 TABLET EVERY DAY 90 tablet 2  . ramipril (ALTACE) 10 MG capsule TAKE 1 CAPSULE TWICE DAILY 180 capsule 3  . amLODipine (NORVASC) 10 MG tablet Take 1 tablet (10 mg total) by mouth daily. 90 tablet 3   . benzonatate (TESSALON) 200 MG capsule Take 1 capsule (200 mg total) by mouth 3 (three) times daily as needed for cough. 30 capsule 0  . levothyroxine (SYNTHROID) 125 MCG tablet TAKE 1 TABLET EVERY DAY BEFORE BREAKFAST 90 tablet 0  . meclizine (ANTIVERT) 25 MG tablet Take 1 tablet (25 mg total) by mouth 3 (three) times daily as needed. For vertigo 90 tablet 0  . metoprolol tartrate (LOPRESSOR) 25 MG tablet TAKE 1 TABLET TWICE DAILY 180 tablet 1  . pantoprazole (PROTONIX) 40 MG tablet TAKE 1 TABLET EVERY MONDAY, WEDNESDAY, AND FRIDAY. 36 tablet 0   No facility-administered medications prior to visit.     Per HPI unless specifically indicated in ROS section below Review of Systems  Constitutional: Negative for activity change, appetite change, chills, fatigue, fever and unexpected weight change.  HENT: Negative for hearing loss.   Eyes: Negative for visual disturbance.  Respiratory: Positive for cough (chronic dry) and shortness of breath (chronic mild). Negative for chest tightness and wheezing.   Cardiovascular: Positive for leg swelling (off and on). Negative for chest pain and palpitations.  Gastrointestinal: Negative for abdominal distention, abdominal pain, blood in stool, constipation, diarrhea, nausea and vomiting.  Genitourinary: Negative for difficulty urinating and hematuria.  Musculoskeletal: Negative for arthralgias, myalgias and neck pain.  Skin: Negative for rash.  Neurological: Negative for dizziness, seizures, syncope and headaches.  Hematological: Negative for adenopathy. Does not bruise/bleed easily.  Psychiatric/Behavioral: Negative for dysphoric mood. The patient is not nervous/anxious.    Objective:  BP (!) 152/100   Pulse 73   Temp (!) 97.4 F (36.3 C) (Temporal)   Ht 5\' 2"  (1.575 m)   Wt 164 lb 7 oz (74.6 kg)   SpO2 92%   BMI 30.08 kg/m   Wt Readings from Last 3 Encounters:  09/17/20 164 lb 7 oz (74.6 kg)  06/28/20 167 lb 6.4 oz (75.9 kg)  03/19/20 176 lb  9.6 oz (80.1 kg)      Physical Exam Vitals and nursing note reviewed.  Constitutional:      General: She is not in acute distress.    Appearance: Normal appearance. She is well-developed. She is not ill-appearing.  HENT:     Head: Normocephalic and atraumatic.     Right Ear: Hearing, tympanic membrane, ear canal and external ear normal.     Left Ear: Hearing, tympanic membrane, ear canal and external ear normal.     Nose: Nose normal.  Eyes:     General: No scleral icterus.    Extraocular Movements: Extraocular movements intact.     Conjunctiva/sclera: Conjunctivae normal.     Pupils: Pupils are equal, round, and reactive to light.     Comments: Cataracts present bilaterally  Neck:     Thyroid: No thyroid mass or thyromegaly.     Vascular: No carotid bruit.  Cardiovascular:     Rate and Rhythm: Normal rate and regular rhythm.     Pulses:  Normal pulses.          Radial pulses are 2+ on the right side and 2+ on the left side.     Heart sounds: Murmur (3/6 systolic throughout) heard.    Pulmonary:     Effort: Pulmonary effort is normal. No respiratory distress.     Breath sounds: Normal breath sounds. No wheezing, rhonchi or rales.  Abdominal:     General: Bowel sounds are normal. There is no distension.     Palpations: Abdomen is soft. There is no mass.     Tenderness: There is no abdominal tenderness. There is no guarding or rebound.     Hernia: No hernia is present.  Musculoskeletal:        General: Normal range of motion.     Cervical back: Normal range of motion and neck supple.     Right lower leg: No edema.     Left lower leg: No edema.  Lymphadenopathy:     Cervical: No cervical adenopathy.  Skin:    General: Skin is warm and dry.     Findings: No rash.  Neurological:     General: No focal deficit present.     Mental Status: She is alert and oriented to person, place, and time.     Comments: CN grossly intact, station and gait intact  Psychiatric:        Mood  and Affect: Mood normal.        Behavior: Behavior normal.        Thought Content: Thought content normal.        Judgment: Judgment normal.       Results for orders placed or performed in visit on 09/10/20  CBC with Differential/Platelet  Result Value Ref Range   WBC 4.5 4.0 - 10.5 K/uL   RBC 4.08 3.87 - 5.11 Mil/uL   Hemoglobin 11.0 (L) 12.0 - 15.0 g/dL   HCT 34.2 (L) 36.0 - 46.0 %   MCV 83.7 78.0 - 100.0 fl   MCHC 32.3 30.0 - 36.0 g/dL   RDW 16.7 (H) 11.5 - 15.5 %   Platelets 145.0 (L) 150.0 - 400.0 K/uL   Neutrophils Relative % 69.1 43.0 - 77.0 %   Lymphocytes Relative 17.9 12.0 - 46.0 %   Monocytes Relative 9.7 3.0 - 12.0 %   Eosinophils Relative 1.4 0.0 - 5.0 %   Basophils Relative 1.9 0.0 - 3.0 %   Neutro Abs 3.1 1.4 - 7.7 K/uL   Lymphs Abs 0.8 0.7 - 4.0 K/uL   Monocytes Absolute 0.4 0.1 - 1.0 K/uL   Eosinophils Absolute 0.1 0.0 - 0.7 K/uL   Basophils Absolute 0.1 0.0 - 0.1 K/uL  Microalbumin / creatinine urine ratio  Result Value Ref Range   Microalb, Ur 37.3 (H) 0.0 - 1.9 mg/dL   Creatinine,U 145.7 mg/dL   Microalb Creat Ratio 25.6 0.0 - 30.0 mg/g  VITAMIN D 25 Hydroxy (Vit-D Deficiency, Fractures)  Result Value Ref Range   VITD 46.38 30.00 - 100.00 ng/mL  T4, free  Result Value Ref Range   Free T4 1.73 (H) 0.60 - 1.60 ng/dL  TSH  Result Value Ref Range   TSH 0.64 0.35 - 4.50 uIU/mL  Lipid panel  Result Value Ref Range   Cholesterol 144 0 - 200 mg/dL   Triglycerides 81.0 0.0 - 149.0 mg/dL   HDL 32.60 (L) >39.00 mg/dL   VLDL 16.2 0.0 - 40.0 mg/dL   LDL Cholesterol 96 0 - 99 mg/dL  Total CHOL/HDL Ratio 4    NonHDL 111.88   Comprehensive metabolic panel  Result Value Ref Range   Sodium 140 135 - 145 mEq/L   Potassium 4.3 3.5 - 5.1 mEq/L   Chloride 104 96 - 112 mEq/L   CO2 28 19 - 32 mEq/L   Glucose, Bld 96 70 - 99 mg/dL   BUN 24 (H) 6 - 23 mg/dL   Creatinine, Ser 1.31 (H) 0.40 - 1.20 mg/dL   Total Bilirubin 1.5 (H) 0.2 - 1.2 mg/dL   Alkaline  Phosphatase 76 39 - 117 U/L   AST 33 0 - 37 U/L   ALT 30 0 - 35 U/L   Total Protein 6.5 6.0 - 8.3 g/dL   Albumin 3.8 3.5 - 5.2 g/dL   GFR 40.49 (L) >60.00 mL/min   Calcium 9.3 8.4 - 10.5 mg/dL   Assessment & Plan:  This visit occurred during the SARS-CoV-2 public health emergency.  Safety protocols were in place, including screening questions prior to the visit, additional usage of staff PPE, and extensive cleaning of exam room while observing appropriate contact time as indicated for disinfecting solutions.   Problem List Items Addressed This Visit    Hypothyroidism    Chronic, stable period on levothyroxine replacement.       Relevant Medications   levothyroxine (SYNTHROID) 125 MCG tablet   metoprolol tartrate 37.5 MG TABS   Osteopenia    Reviewed calcium, vit D intake as well as regular weight bearing exercise.       COLONIC POLYPS, ADENOMATOUS, HX OF    Will be due for rpt colonoscopy this summer       Persistent atrial fibrillation ([place])    Continues eliquis and metoprolol.       Relevant Medications   amLODipine (NORVASC) 10 MG tablet   metoprolol tartrate 37.5 MG TABS   HTN (hypertension), malignant    Chronic, BP remaining elevated despite 4 drug regimen - will increase metoprolol to 37.5mg  bid.       Relevant Medications   amLODipine (NORVASC) 10 MG tablet   metoprolol tartrate 37.5 MG TABS   Microcytic anemia    Persistent mild normocytic anemia.  Will need iron panel and b12 checked next labwork.       Vitamin D deficiency    Stable period on daily replacement.       Health maintenance examination - Primary    Preventative protocols reviewed and updated unless pt declined. Discussed healthy diet and lifestyle.       Rheumatoid arthritis ([place])    Appreciate rheum care - on MTX      Mitral valve regurgitation    Sees cardiology Gwenlyn Found).  Notes increasing chronic dyspnea. She will call to schedule sooner cards f/u appointment.       Relevant  Medications   amLODipine (NORVASC) 10 MG tablet   metoprolol tartrate 37.5 MG TABS   Grade III diastolic dysfunction   Unintentional weight loss    Ongoing weight loss noted, associated with early satiety and new renal insuff. Check abd [place], if pancreas not visualized, check CT abdomen.       Relevant Orders   US Abdomen Complete   Renal insufficiency    Newly noted. Recheck at next labs in 3 months.  Check abd [place] and UA today.       Relevant Orders   US Abdomen Complete    Other Visit Diagnoses    Early satiety       Relevant  Orders   US Abdomen Complete       Meds ordered this encounter  Medications  . pantoprazole (PROTONIX) 40 MG tablet    Sig: Take 1 tablet (40 mg total) by mouth daily as needed (heartburn).    Dispense:  30 tablet    Refill:  3  . levothyroxine (SYNTHROID) 125 MCG tablet    Sig: Take 1 tablet (125 mcg total) by mouth daily before breakfast.    Dispense:  90 tablet    Refill:  3  . amLODipine (NORVASC) 10 MG tablet    Sig: Take 1 tablet (10 mg total) by mouth daily.    Dispense:  90 tablet    Refill:  3  . metoprolol tartrate 37.5 MG TABS    Sig: Take 37.5 mg by mouth 2 (two) times daily.    Dispense:  180 tablet    Refill:  1    Note new sig   Orders Placed This Encounter  Procedures  . US Abdomen Complete    Standing Status:   Future    Standing Expiration Date:   09/17/2021    Order Specific Question:   Reason for Exam (SYMPTOM  OR DIAGNOSIS REQUIRED)    Answer:   weight loss, early satiety, new renal insufficiency    Order Specific Question:   Preferred imaging location?    Answer:   [facility]    Patient instructions: Kidney function was a bit impaired on testing today. Check urinalysis today and we will also schedule abdominal ultrasound to further evaluate ongoing weight loss.  Blood pressures are staying elevated - increase metoprolol to 37.5mg  twice daily, monitoring for low heart rate.  Return in 3 months for follow  up visit.   Follow up plan: Return in about 3 months (around 12/18/2020), or if symptoms worsen or fail to improve, for follow up visit.  Ria Bush, MD

## 2020-09-17 NOTE — Assessment & Plan Note (Signed)
Chronic, BP remaining elevated despite 4 drug regimen - will increase metoprolol to 37.5mg  bid.

## 2020-09-26 DIAGNOSIS — M2550 Pain in unspecified joint: Secondary | ICD-10-CM | POA: Diagnosis not present

## 2020-09-26 DIAGNOSIS — Z1589 Genetic susceptibility to other disease: Secondary | ICD-10-CM | POA: Diagnosis not present

## 2020-09-26 DIAGNOSIS — Z79899 Other long term (current) drug therapy: Secondary | ICD-10-CM | POA: Diagnosis not present

## 2020-09-26 DIAGNOSIS — E663 Overweight: Secondary | ICD-10-CM | POA: Diagnosis not present

## 2020-09-26 DIAGNOSIS — Z6828 Body mass index (BMI) 28.0-28.9, adult: Secondary | ICD-10-CM | POA: Diagnosis not present

## 2020-09-26 DIAGNOSIS — M0579 Rheumatoid arthritis with rheumatoid factor of multiple sites without organ or systems involvement: Secondary | ICD-10-CM | POA: Diagnosis not present

## 2020-10-08 ENCOUNTER — Ambulatory Visit
Admission: RE | Admit: 2020-10-08 | Discharge: 2020-10-08 | Disposition: A | Discharge status: Home / Self Care | Payer: Medicare HMO | Source: Ambulatory Visit | Attending: Family Medicine | Admitting: Family Medicine

## 2020-10-08 ENCOUNTER — Telehealth: Payer: Self-pay | Admitting: Cardiovascular Disease

## 2020-10-08 DIAGNOSIS — R6881 Early satiety: Secondary | ICD-10-CM | POA: Diagnosis not present

## 2020-10-08 DIAGNOSIS — K7689 Other specified diseases of liver: Secondary | ICD-10-CM | POA: Diagnosis not present

## 2020-10-08 DIAGNOSIS — R634 Abnormal weight loss: Secondary | ICD-10-CM | POA: Diagnosis not present

## 2020-10-08 DIAGNOSIS — N289 Disorder of kidney and ureter, unspecified: Secondary | ICD-10-CM

## 2020-10-08 DIAGNOSIS — K8020 Calculus of gallbladder without cholecystitis without obstruction: Secondary | ICD-10-CM | POA: Diagnosis not present

## 2020-10-08 NOTE — Telephone Encounter (Signed)
Pt c/o Shortness Of Breath: STAT if SOB developed within the last 24 hours or pt is noticeably SOB on the phone  1. Are you currently SOB (can you hear that pt is SOB on the phone)? Not at this time   2. How long have you been experiencing SOB? about a couple of weeks  3. Are you SOB when sitting or when up moving around? When she moves around and at night it is difficult to breathe  4. Are you currently experiencing any other symptoms? Feet and ankles swelling- pt wants to be seen asap

## 2020-10-08 NOTE — Telephone Encounter (Signed)
Spoke to pt who report for the past 2 weeks she's been experiencing bilateral ankle and feet edema. She state symptoms are relieved with rest and elevation but gradually will swell throughout the day. Pt also report some SOB with exertion and state she has to sleep with 2 pillows at night.  Appointment scheduled for tomorrow 4/20 for further evaluations.

## 2020-10-09 ENCOUNTER — Other Ambulatory Visit: Payer: Self-pay

## 2020-10-09 ENCOUNTER — Ambulatory Visit: Payer: Medicare HMO | Admitting: Cardiovascular Disease

## 2020-10-09 ENCOUNTER — Other Ambulatory Visit: Payer: Self-pay | Admitting: Family Medicine

## 2020-10-09 ENCOUNTER — Encounter: Payer: Self-pay | Admitting: Cardiovascular Disease

## 2020-10-09 DIAGNOSIS — I10 Essential (primary) hypertension: Secondary | ICD-10-CM | POA: Diagnosis not present

## 2020-10-09 DIAGNOSIS — I340 Nonrheumatic mitral (valve) insufficiency: Secondary | ICD-10-CM

## 2020-10-09 DIAGNOSIS — R6881 Early satiety: Secondary | ICD-10-CM

## 2020-10-09 DIAGNOSIS — R634 Abnormal weight loss: Secondary | ICD-10-CM

## 2020-10-09 MED ORDER — SODIUM CHLORIDE 0.9% FLUSH: 3.0000 mL | Freq: Two times a day (BID) | INTRAVENOUS | Status: DC

## 2020-10-09 NOTE — H&P (View-Only) (Signed)
10/09/2020 Chong Sicilian [place]   10-Dec-1945  [id number]  Primary Physician Ria Bush, MD Primary Cardiologist: Lorretta Harp MD [name], [place]  HPI:  Joanna Hood is a 75 y.o.  moderately overweight African-American female who I initially met several years ago.I last saw her in the office  03/19/2020.Marland KitchenWe first [place] she was admitted to [facility] with diastolic heart failure, hypertension and atrial fibrillation. She ultimately underwent cardiac catheterization revealing essentially normal coronary arteries and normal LV function by Dr. Ellyn Hack. Her A. Fib converted to sinus rhythm with the aid of IV Cardizem. Her blood pressure was better controlled. She saw Ellen Henri PA-C in the office and again was in atrial fibrillation at which time her beta blocker was adjusted and Eliquis was added because of increased CHA2DSVASC2 score of 2. Since I saw her in the office she remained clinically stable. She denies chest pain or shortness of breath.  She remains in A. fib on Eliquis oral anticoagulation. She did contract COVID-19 in late July 2020 and completed her quarantine a week ago. She had mild flulike symptoms. She did have a 2D echo performed 05/08/2015 that showed normal LV function with moderate mitral regurgitation and severe left atrial enlargement.   When I saw her 6 months ago she was complaining of only mild dyspnea. She did have a 2D echo performed 02/19/2020 that revealed normal LV size and function, mild LVH, grade 3 diastolic dysfunction, severe left atrial enlargement and severe mitral regurgitation.  Since I saw her 6 months ago she was doing well until several weeks ago when she noticed increasing lower extreme edema and progressive dyspnea on exertion.    Current Meds  Medication Sig  . amLODipine (NORVASC) 10 MG tablet Take 1 tablet (10 mg total) by mouth daily.  . Calcium Carb-Cholecalciferol (CALCIUM-VITAMIN D) 600-400  MG-UNIT TABS Take 1 tablet by mouth daily.  . cholecalciferol (VITAMIN D) 1000 UNITS tablet Take 1,000 Units by mouth daily.  Marland Kitchen ELIQUIS 5 MG TABS tablet TAKE 1 TABLET TWICE DAILY  . fluticasone (FLONASE) 50 MCG/ACT nasal spray Place 2 sprays into both nostrils daily. (Patient taking differently: Place 2 sprays into both nostrils as needed.)  . furosemide (LASIX) 20 MG tablet TAKE 1 TABLET EVERY DAY  . levothyroxine (SYNTHROID) 125 MCG tablet Take 1 tablet (125 mcg total) by mouth daily before breakfast.  . methotrexate (RHEUMATREX) 2.5 MG tablet Take 15 mg by mouth once a week. Caution:Chemotherapy. Protect from light.  . metoprolol tartrate 37.5 MG TABS Take 37.5 mg by mouth 2 (two) times daily.  . potassium chloride SA (KLOR-CON) 20 MEQ tablet TAKE 1 TABLET EVERY DAY  . ramipril (ALTACE) 10 MG capsule TAKE 1 CAPSULE TWICE DAILY     Allergies  Allergen Reactions  . Metronidazole     REACTION: Rash, itching    Social History   Socioeconomic History  . Marital status: Single    Spouse name: Not on file  . Number of children: 1  . Years of education: Not on file  . Highest education level: Not on file  Occupational History  . Occupation: Retired  Tobacco Use  . Smoking status: Former Smoker    Quit date: 05/16/1969    Years since quitting: 51.4  . Smokeless tobacco: Former Network engineer  . Vaping Use: Never used  Substance and Sexual Activity  . Alcohol use: No    Alcohol/week: 0.0 standard drinks  . Drug use: No  .  Sexual activity: Never  Other Topics Concern  . 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 Difficulty of Paying Living Expenses: Not hard at all  Food Insecurity: No Food Insecurity  . Worried About Charity fundraiser in the Last Year: Never true   . Ran Out of Food in the Last Year: Never true  Transportation Needs: No Transportation Needs  . Lack of Transportation (Medical): No  . Lack of Transportation (Non-Medical): No  Physical Activity: Inactive  . Days of Exercise per Week: 0 days  . Minutes of Exercise per Session: 0 min  Stress: No Stress Concern Present  . Feeling of Stress : Not at all  Social Connections: Not on file  Intimate Partner Violence: Not At Risk  . Fear of Current or Ex-Partner: No  . Emotionally Abused: No  . Physically Abused: No  . Sexually Abused: No     Review of Systems: General: negative for chills, fever, night sweats or weight changes.  Cardiovascular: negative for chest pain, dyspnea on exertion, edema, orthopnea, palpitations, paroxysmal nocturnal dyspnea or shortness of breath Dermatological: negative for rash Respiratory: negative for cough or wheezing Urologic: negative for hematuria Abdominal: negative for nausea, vomiting, diarrhea, bright red blood per rectum, melena, or hematemesis Neurologic: negative for visual changes, syncope, or dizziness All other systems reviewed and are otherwise negative except as noted above.    Blood pressure (!) 150/70, pulse (!) 58, height 5' 4"  (1.626 m), weight 161 lb (73 kg), SpO2 96 %.  General appearance: alert and no distress Neck: no adenopathy, no carotid bruit, no JVD, supple, symmetrical, trachea midline and thyroid not enlarged, symmetric, no tenderness/mass/nodules Lungs: clear to auscultation bilaterally Heart: irregularly irregular rhythm Extremities: Trace left ankle edema Pulses: 2+ and symmetric Skin: Skin color, texture, turgor normal. No rashes or lesions Neurologic: Alert and oriented X 3, normal strength and tone. Normal symmetric reflexes. Normal coordination and gait  EKG atrial fibrillation with a slow ventricular sponsor 58 and inferolateral T wave inversion with low limb voltage.  I personally reviewed this  EKG.  ASSESSMENT AND PLAN:   HTN (hypertension), malignant History of essential hypertension a blood pressure measured today 150/70.  She is on amlodipine and metoprolol as well as ramipril.  Mitral valve regurgitation History of severe MR by 2D echo performed 02/19/2020.  She also had severely dilated left atrium with an elevated right ventricular systolic pressure of 78 mmHg.  Over the last several weeks she is noted some lower extreme edema and some increasing dyspnea on exertion.  She is on furosemide 20 mg a day which I am going to increase to 40 mg a day.  She is also on Eliquis for persistent atrial fibrillation.  I am going to arrange for her to undergo transesophageal echo next week with right left heart cath (radial/brachial).  She may be a candidate for minimally invasive mitral valve repair.      Lorretta Harp MD FACP,FACC,FAHA, [facility] 10/09/2020 11:34 AM

## 2020-10-09 NOTE — Assessment & Plan Note (Signed)
History of severe MR by 2D echo performed 02/19/2020.  She also had severely dilated left atrium with an elevated right ventricular systolic pressure of 78 mmHg.  Over the last several weeks she is noted some lower extreme edema and some increasing dyspnea on exertion.  She is on furosemide 20 mg a day which I am going to increase to 40 mg a day.  She is also on Eliquis for persistent atrial fibrillation.  I am going to arrange for her to undergo transesophageal echo next week with right left heart cath (radial/brachial).  She may be a candidate for minimally invasive mitral valve repair.

## 2020-10-09 NOTE — Patient Instructions (Addendum)
Dear Joanna Hood You are scheduled for a TEE on 10/21/20 with Dr. Stanford Breed.  Please arrive at the [facility] (Main Entrance A) at [facility]: [address] [place], [place] [address] at 6:30 am. (1 hour prior to procedure unless lab work is needed; if lab work is needed arrive 1.5 hours ahead)  DIET: Nothing to eat or drink after midnight except a sip of water with medications (see medication instructions below)  Medication Instructions: Hold Eliquis for 48 hours prior to procedure    Labs: in about 1 week. BMET and CBC.  You must have a responsible person to drive you home and stay in the waiting area during your procedure. Failure to do so could result in cancellation.  Bring your insurance cards.  *Special Note: Every effort is made to have your procedure done on time. Occasionally there are emergencies that occur at the hospital that may cause delays. Please be patient if a delay does occur.      [facility] [place] [address] Dept: [phone] Loc: [phone]  Joanna Hood  10/09/2020  You are scheduled for a Cardiac Catheterization on Monday, May 2 with Dr. Quay Burow.  1. Please arrive at the [facility] (Main Entrance A) at [facility]: [address] [place], [place] [address] at 11:30 AM (This time is two hours before your procedure to ensure your preparation). Free valet parking service is available.   Special note: Every effort is made to have your procedure done on time. Please understand that emergencies sometimes delay scheduled procedures.  2. Diet: Do not eat solid foods after midnight.  The patient may have clear liquids until 5am upon the day of the procedure.  3. Labs: You will need to have blood drawn on next week.   4. Medication instructions in preparation for your procedure:  Stop taking Eliquis (Apixiban) on Friday,  April 29.  Stop taking, Lasix (Furosemide)  Monday, May 2,2022  On the morning of your procedure, take your Aspirin and any morning medicines NOT listed above.  You may use sips of water.  5. Plan for one night stay--bring personal belongings. 6. Bring a current list of your medications and current insurance cards. 7. You MUST have a responsible person to drive you home. 8. Someone MUST be with you the first 24 hours after you arrive home or your discharge will be delayed. 9. Please wear clothes that are easy to get on and off and wear slip-on shoes.  Thank you for allowing [place] to care for you!   -- [facility]  You will need a COVID-19  test prior to your procedure. You are scheduled for Friday 10/18/20 at 10:55AM. This is a Drive Up Visit at [address] [place], [place] [address]. Someone will direct you to the appropriate testing line. Stay in your car and someone will be with you shortly.  You will need at 2 week follow up appointment to see Dr. Gwenlyn Found.

## 2020-10-09 NOTE — Progress Notes (Signed)
10/09/2020 Joanna Hood   September 19, 1945  [id number]  Primary Physician Joanna Bush, MD Primary Cardiologist: Joanna Harp MD Joanna Hood, [place]  HPI:  Joanna Hood is a 75 y.o.  moderately overweight African-American female who I initially met several years ago.I last saw her in the office  03/19/2020.Marland KitchenWe first [place] she was admitted to [facility] with diastolic heart failure, hypertension and atrial fibrillation. She ultimately underwent cardiac catheterization revealing essentially normal coronary arteries and normal LV function by Dr. Ellyn Hack. Her A. Fib converted to sinus rhythm with the aid of IV Cardizem. Her blood pressure was better controlled. She saw Ellen Henri PA-C in the office and again was in atrial fibrillation at which time her beta blocker was adjusted and Eliquis was added because of increased CHA2DSVASC2 score of 2. Since I saw her in the office she remained clinically stable. She denies chest pain or shortness of breath.  She remains in A. fib on Eliquis oral anticoagulation. She did contract COVID-19 in late July 2020 and completed her quarantine a week ago. She had mild flulike symptoms. She did have a 2D echo performed 05/08/2015 that showed normal LV function with moderate mitral regurgitation and severe left atrial enlargement.   When I saw her 6 months ago she was complaining of only mild dyspnea. She did have a 2D echo performed 02/19/2020 that revealed normal LV size and function, mild LVH, grade 3 diastolic dysfunction, severe left atrial enlargement and severe mitral regurgitation.  Since I saw her 6 months ago she was doing well until several weeks ago when she noticed increasing lower extreme edema and progressive dyspnea on exertion.    Current Meds  Medication Sig  . amLODipine (NORVASC) 10 MG tablet Take 1 tablet (10 mg total) by mouth daily.  . Calcium Carb-Cholecalciferol (CALCIUM-VITAMIN D) 600-400  MG-UNIT TABS Take 1 tablet by mouth daily.  . cholecalciferol (VITAMIN D) 1000 UNITS tablet Take 1,000 Units by mouth daily.  Marland Kitchen ELIQUIS 5 MG TABS tablet TAKE 1 TABLET TWICE DAILY  . fluticasone (FLONASE) 50 MCG/ACT nasal spray Place 2 sprays into both nostrils daily. (Patient taking differently: Place 2 sprays into both nostrils as needed.)  . furosemide (LASIX) 20 MG tablet TAKE 1 TABLET EVERY DAY  . levothyroxine (SYNTHROID) 125 MCG tablet Take 1 tablet (125 mcg total) by mouth daily before breakfast.  . methotrexate (RHEUMATREX) 2.5 MG tablet Take 15 mg by mouth once a week. Caution:Chemotherapy. Protect from light.  . metoprolol tartrate 37.5 MG TABS Take 37.5 mg by mouth 2 (two) times daily.  . potassium chloride SA (KLOR-CON) 20 MEQ tablet TAKE 1 TABLET EVERY DAY  . ramipril (ALTACE) 10 MG capsule TAKE 1 CAPSULE TWICE DAILY     Allergies  Allergen Reactions  . Metronidazole     REACTION: Rash, itching    Social History   Socioeconomic History  . Marital status: Single    Spouse name: Not on file  . Number of children: 1  . Years of education: Not on file  . Highest education level: Not on file  Occupational History  . Occupation: Retired  Tobacco Use  . Smoking status: Former Smoker    Quit date: 05/16/1969    Years since quitting: 51.4  . Smokeless tobacco: Former Network engineer  . Vaping Use: Never used  Substance and Sexual Activity  . Alcohol use: No    Alcohol/week: 0.0 standard drinks  . Drug use: No  .  Sexual activity: Never  Other Topics Concern  . 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 Difficulty of Paying Living Expenses: Not hard at all  Food Insecurity: No Food Insecurity  . Worried About Charity fundraiser in the Last Year: Never true   . Ran Out of Food in the Last Year: Never true  Transportation Needs: No Transportation Needs  . Lack of Transportation (Medical): No  . Lack of Transportation (Non-Medical): No  Physical Activity: Inactive  . Days of Exercise per Week: 0 days  . Minutes of Exercise per Session: 0 min  Stress: No Stress Concern Present  . Feeling of Stress : Not at all  Social Connections: Not on file  Intimate Partner Violence: Not At Risk  . Fear of Current or Ex-Partner: No  . Emotionally Abused: No  . Physically Abused: No  . Sexually Abused: No     Review of Systems: General: negative for chills, fever, night sweats or weight changes.  Cardiovascular: negative for chest pain, dyspnea on exertion, edema, orthopnea, palpitations, paroxysmal nocturnal dyspnea or shortness of breath Dermatological: negative for rash Respiratory: negative for cough or wheezing Urologic: negative for hematuria Abdominal: negative for nausea, vomiting, diarrhea, bright red blood per rectum, melena, or hematemesis Neurologic: negative for visual changes, syncope, or dizziness All other systems reviewed and are otherwise negative except as noted above.    Blood pressure (!) 150/70, pulse (!) 58, height 5' 4"  (1.626 m), weight 161 lb (73 kg), SpO2 96 %.  General appearance: alert and no distress Neck: no adenopathy, no carotid bruit, no JVD, supple, symmetrical, trachea midline and thyroid not enlarged, symmetric, no tenderness/mass/nodules Lungs: clear to auscultation bilaterally Heart: irregularly irregular rhythm Extremities: Trace left ankle edema Pulses: 2+ and symmetric Skin: Skin color, texture, turgor normal. No rashes or lesions Neurologic: Alert and oriented X 3, normal strength and tone. Normal symmetric reflexes. Normal coordination and gait  EKG atrial fibrillation with a slow ventricular sponsor 58 and inferolateral T wave inversion with low limb voltage.  I personally reviewed this  EKG.  ASSESSMENT AND PLAN:   HTN (hypertension), malignant History of essential hypertension a blood pressure measured today 150/70.  She is on amlodipine and metoprolol as well as ramipril.  Mitral valve regurgitation History of severe MR by 2D echo performed 02/19/2020.  She also had severely dilated left atrium with an elevated right ventricular systolic pressure of 78 mmHg.  Over the last several weeks she is noted some lower extreme edema and some increasing dyspnea on exertion.  She is on furosemide 20 mg a day which I am going to increase to 40 mg a day.  She is also on Eliquis for persistent atrial fibrillation.  I am going to arrange for her to undergo transesophageal echo next week with right left heart cath (radial/brachial).  She may be a candidate for minimally invasive mitral valve repair.      Joanna Harp MD FACP,FACC,FAHA, [facility] 10/09/2020 11:34 AM

## 2020-10-09 NOTE — Assessment & Plan Note (Signed)
History of essential hypertension a blood pressure measured today 150/70.  She is on amlodipine and metoprolol as well as ramipril.

## 2020-10-14 DIAGNOSIS — I340 Nonrheumatic mitral (valve) insufficiency: Secondary | ICD-10-CM | POA: Diagnosis not present

## 2020-10-14 DIAGNOSIS — I10 Essential (primary) hypertension: Secondary | ICD-10-CM | POA: Diagnosis not present

## 2020-10-14 LAB — BASIC METABOLIC PANEL
BUN/Creatinine Ratio: 11 — ABNORMAL LOW (ref 12–28)
BUN: 10 mg/dL (ref 8–27)
CO2: 24 mmol/L (ref 20–29)
Calcium: 9.6 mg/dL (ref 8.7–10.3)
Chloride: 103 mmol/L (ref 96–106)
Creatinine, Ser: 0.92 mg/dL (ref 0.57–1.00)
Glucose: 92 mg/dL (ref 65–99)
Potassium: 4.2 mmol/L (ref 3.5–5.2)
Sodium: 141 mmol/L (ref 134–144)
eGFR: 66 mL/min/{1.73_m2} (ref >59)

## 2020-10-14 LAB — CBC
Hematocrit: 34.7 % (ref 34.0–46.6)
Hemoglobin: 11.4 g/dL (ref 11.1–15.9)
MCH: 26.7 pg (ref 26.6–33.0)
MCHC: 32.9 g/dL (ref 31.5–35.7)
MCV: 81 fL (ref 79–97)
Platelets: 157 10*3/uL (ref 150–450)
RBC: 4.27 x10E6/uL (ref 3.77–5.28)
RDW: 14.8 % (ref 11.7–15.4)
WBC: 4.1 10*3/uL (ref 3.4–10.8)

## 2020-10-17 ENCOUNTER — Telehealth: Payer: Self-pay | Admitting: *Deleted

## 2020-10-17 NOTE — Telephone Encounter (Signed)
Pt contacted pre-catheterization scheduled at [facility] for: Monday Oct 21, 2020 11:30 AM/TEE 7:30 AM Verified arrival time and place: [facility]) at: 6:30 AM   Nothing to eat or drink after midnight, may have sips of water to take medications   Hold: Eliquis-none 10/19/20 until post procedure Lasix-AM of procedure  Except hold medications AM meds can be  taken pre-cath with sips of water including: ASA 81 mg   Confirmed patient has responsible adult to drive home post procedure and be with patient first 24 hours after arriving home: yes  You are allowed ONE visitor in the waiting room during the time you are at the hospital for your procedure. Both you and your visitor must wear a mask once you enter the hospital.   Reviewed procedure/mask/visitor instructions with patient.

## 2020-10-18 ENCOUNTER — Other Ambulatory Visit (HOSPITAL_COMMUNITY)
Admission: RE | Admit: 2020-10-18 | Discharge: 2020-10-18 | Disposition: A | Discharge status: Home / Self Care | Payer: Medicare HMO | Source: Ambulatory Visit | Attending: Cardiology | Admitting: Cardiology

## 2020-10-18 DIAGNOSIS — Z01812 Encounter for preprocedural laboratory examination: Secondary | ICD-10-CM | POA: Diagnosis not present

## 2020-10-18 DIAGNOSIS — Z20822 Contact with and (suspected) exposure to covid-19: Secondary | ICD-10-CM | POA: Insufficient documentation

## 2020-10-19 LAB — SARS CORONAVIRUS 2 (TAT 6-24 HRS): SARS Coronavirus 2: NEGATIVE

## 2020-10-20 HISTORY — PX: MULTIPLE TOOTH EXTRACTIONS: SHX2053

## 2020-10-21 ENCOUNTER — Ambulatory Visit (HOSPITAL_COMMUNITY): Payer: Medicare HMO

## 2020-10-21 ENCOUNTER — Ambulatory Visit (HOSPITAL_COMMUNITY): Payer: Medicare HMO | Admitting: Certified Registered Nurse Anesthetist

## 2020-10-21 ENCOUNTER — Inpatient Hospital Stay (HOSPITAL_COMMUNITY)
Admission: RE | Disposition: A | Discharge status: Home / Self Care | Payer: Self-pay | Source: Home / Self Care | Attending: Thoracic Surgery (Cardiothoracic Vascular Surgery)

## 2020-10-21 ENCOUNTER — Encounter (HOSPITAL_COMMUNITY): Payer: Self-pay | Admitting: Cardiology

## 2020-10-21 ENCOUNTER — Encounter (HOSPITAL_COMMUNITY)
Admission: RE | Disposition: A | Discharge status: Home / Self Care | Payer: Self-pay | Source: Home / Self Care | Attending: Thoracic Surgery (Cardiothoracic Vascular Surgery)

## 2020-10-21 ENCOUNTER — Inpatient Hospital Stay (HOSPITAL_COMMUNITY)
Admission: RE | Admit: 2020-10-21 | Discharge: 2020-11-07 | DRG: 216 | Disposition: A | Discharge status: Home / Self Care | Payer: Medicare HMO | Attending: Thoracic Surgery (Cardiothoracic Vascular Surgery) | Admitting: Thoracic Surgery (Cardiothoracic Vascular Surgery)

## 2020-10-21 DIAGNOSIS — Z952 Presence of prosthetic heart valve: Secondary | ICD-10-CM

## 2020-10-21 DIAGNOSIS — I509 Heart failure, unspecified: Secondary | ICD-10-CM | POA: Diagnosis not present

## 2020-10-21 DIAGNOSIS — K045 Chronic apical periodontitis: Secondary | ICD-10-CM | POA: Diagnosis present

## 2020-10-21 DIAGNOSIS — I071 Rheumatic tricuspid insufficiency: Secondary | ICD-10-CM | POA: Diagnosis present

## 2020-10-21 DIAGNOSIS — K056 Periodontal disease, unspecified: Secondary | ICD-10-CM | POA: Diagnosis not present

## 2020-10-21 DIAGNOSIS — I5043 Acute on chronic combined systolic (congestive) and diastolic (congestive) heart failure: Secondary | ICD-10-CM | POA: Diagnosis present

## 2020-10-21 DIAGNOSIS — D696 Thrombocytopenia, unspecified: Secondary | ICD-10-CM | POA: Diagnosis not present

## 2020-10-21 DIAGNOSIS — I110 Hypertensive heart disease with heart failure: Secondary | ICD-10-CM | POA: Diagnosis present

## 2020-10-21 DIAGNOSIS — Z79890 Hormone replacement therapy (postmenopausal): Secondary | ICD-10-CM

## 2020-10-21 DIAGNOSIS — J9811 Atelectasis: Secondary | ICD-10-CM

## 2020-10-21 DIAGNOSIS — I051 Rheumatic mitral insufficiency: Secondary | ICD-10-CM | POA: Diagnosis not present

## 2020-10-21 DIAGNOSIS — K0889 Other specified disorders of teeth and supporting structures: Secondary | ICD-10-CM | POA: Diagnosis not present

## 2020-10-21 DIAGNOSIS — Z87891 Personal history of nicotine dependence: Secondary | ICD-10-CM

## 2020-10-21 DIAGNOSIS — Z01818 Encounter for other preprocedural examination: Secondary | ICD-10-CM

## 2020-10-21 DIAGNOSIS — I700 Atherosclerosis of aorta: Secondary | ICD-10-CM | POA: Diagnosis present

## 2020-10-21 DIAGNOSIS — E663 Overweight: Secondary | ICD-10-CM | POA: Diagnosis present

## 2020-10-21 DIAGNOSIS — I272 Pulmonary hypertension, unspecified: Secondary | ICD-10-CM | POA: Diagnosis present

## 2020-10-21 DIAGNOSIS — I4892 Unspecified atrial flutter: Secondary | ICD-10-CM | POA: Diagnosis not present

## 2020-10-21 DIAGNOSIS — K083 Retained dental root: Secondary | ICD-10-CM | POA: Diagnosis present

## 2020-10-21 DIAGNOSIS — M069 Rheumatoid arthritis, unspecified: Secondary | ICD-10-CM | POA: Diagnosis present

## 2020-10-21 DIAGNOSIS — E039 Hypothyroidism, unspecified: Secondary | ICD-10-CM | POA: Diagnosis present

## 2020-10-21 DIAGNOSIS — K0530 Chronic periodontitis, unspecified: Secondary | ICD-10-CM | POA: Diagnosis present

## 2020-10-21 DIAGNOSIS — K089 Disorder of teeth and supporting structures, unspecified: Secondary | ICD-10-CM | POA: Diagnosis not present

## 2020-10-21 DIAGNOSIS — Z888 Allergy status to other drugs, medicaments and biological substances status: Secondary | ICD-10-CM | POA: Diagnosis not present

## 2020-10-21 DIAGNOSIS — E785 Hyperlipidemia, unspecified: Secondary | ICD-10-CM | POA: Diagnosis present

## 2020-10-21 DIAGNOSIS — Z01810 Encounter for preprocedural cardiovascular examination: Secondary | ICD-10-CM | POA: Diagnosis not present

## 2020-10-21 DIAGNOSIS — Z95828 Presence of other vascular implants and grafts: Secondary | ICD-10-CM

## 2020-10-21 DIAGNOSIS — Z9889 Other specified postprocedural states: Secondary | ICD-10-CM

## 2020-10-21 DIAGNOSIS — E559 Vitamin D deficiency, unspecified: Secondary | ICD-10-CM | POA: Diagnosis not present

## 2020-10-21 DIAGNOSIS — Z6825 Body mass index (BMI) 25.0-25.9, adult: Secondary | ICD-10-CM | POA: Diagnosis not present

## 2020-10-21 DIAGNOSIS — K029 Dental caries, unspecified: Secondary | ICD-10-CM | POA: Diagnosis present

## 2020-10-21 DIAGNOSIS — Z8616 Personal history of COVID-19: Secondary | ICD-10-CM | POA: Diagnosis not present

## 2020-10-21 DIAGNOSIS — I340 Nonrheumatic mitral (valve) insufficiency: Principal | ICD-10-CM

## 2020-10-21 DIAGNOSIS — E871 Hypo-osmolality and hyponatremia: Secondary | ICD-10-CM | POA: Diagnosis not present

## 2020-10-21 DIAGNOSIS — I10 Essential (primary) hypertension: Secondary | ICD-10-CM | POA: Diagnosis not present

## 2020-10-21 DIAGNOSIS — J90 Pleural effusion, not elsewhere classified: Secondary | ICD-10-CM | POA: Diagnosis not present

## 2020-10-21 DIAGNOSIS — Z7901 Long term (current) use of anticoagulants: Secondary | ICD-10-CM

## 2020-10-21 DIAGNOSIS — M810 Age-related osteoporosis without current pathological fracture: Secondary | ICD-10-CM | POA: Diagnosis present

## 2020-10-21 DIAGNOSIS — R0600 Dyspnea, unspecified: Secondary | ICD-10-CM | POA: Diagnosis present

## 2020-10-21 DIAGNOSIS — I361 Nonrheumatic tricuspid (valve) insufficiency: Secondary | ICD-10-CM | POA: Diagnosis not present

## 2020-10-21 DIAGNOSIS — I5032 Chronic diastolic (congestive) heart failure: Secondary | ICD-10-CM | POA: Diagnosis not present

## 2020-10-21 DIAGNOSIS — I480 Paroxysmal atrial fibrillation: Secondary | ICD-10-CM | POA: Diagnosis not present

## 2020-10-21 DIAGNOSIS — I517 Cardiomegaly: Secondary | ICD-10-CM | POA: Diagnosis not present

## 2020-10-21 DIAGNOSIS — Z79899 Other long term (current) drug therapy: Secondary | ICD-10-CM

## 2020-10-21 DIAGNOSIS — D62 Acute posthemorrhagic anemia: Secondary | ICD-10-CM | POA: Diagnosis not present

## 2020-10-21 DIAGNOSIS — I454 Nonspecific intraventricular block: Secondary | ICD-10-CM | POA: Diagnosis present

## 2020-10-21 DIAGNOSIS — I4821 Permanent atrial fibrillation: Secondary | ICD-10-CM | POA: Diagnosis present

## 2020-10-21 DIAGNOSIS — Z953 Presence of xenogenic heart valve: Secondary | ICD-10-CM | POA: Diagnosis not present

## 2020-10-21 DIAGNOSIS — I081 Rheumatic disorders of both mitral and tricuspid valves: Secondary | ICD-10-CM | POA: Diagnosis not present

## 2020-10-21 DIAGNOSIS — J811 Chronic pulmonary edema: Secondary | ICD-10-CM | POA: Diagnosis not present

## 2020-10-21 DIAGNOSIS — I5033 Acute on chronic diastolic (congestive) heart failure: Secondary | ICD-10-CM | POA: Diagnosis not present

## 2020-10-21 DIAGNOSIS — Z8249 Family history of ischemic heart disease and other diseases of the circulatory system: Secondary | ICD-10-CM

## 2020-10-21 DIAGNOSIS — D509 Iron deficiency anemia, unspecified: Secondary | ICD-10-CM | POA: Diagnosis not present

## 2020-10-21 HISTORY — DX: Nonrheumatic mitral (valve) insufficiency: I34.0

## 2020-10-21 HISTORY — DX: Chronic diastolic (congestive) heart failure: I50.32

## 2020-10-21 HISTORY — DX: Pulmonary hypertension, unspecified: I27.20

## 2020-10-21 HISTORY — DX: Rheumatic tricuspid insufficiency: I07.1

## 2020-10-21 HISTORY — PX: RIGHT/LEFT HEART CATH AND CORONARY ANGIOGRAPHY: CATH118266

## 2020-10-21 HISTORY — PX: TEE WITHOUT CARDIOVERSION: SHX5443

## 2020-10-21 LAB — POCT I-STAT EG7
Acid-Base Excess: 0 mmol/L (ref 0.0–2.0)
Acid-Base Excess: 2 mmol/L (ref 0.0–2.0)
Bicarbonate: 25.5 mmol/L (ref 20.0–28.0)
Bicarbonate: 27.6 mmol/L (ref 20.0–28.0)
Calcium, Ion: 1.22 mmol/L (ref 1.15–1.40)
Calcium, Ion: 1.26 mmol/L (ref 1.15–1.40)
HCT: 36 % (ref 36.0–46.0)
HCT: 36 % (ref 36.0–46.0)
Hemoglobin: 12.2 g/dL (ref 12.0–15.0)
Hemoglobin: 12.2 g/dL (ref 12.0–15.0)
O2 Saturation: 58 %
O2 Saturation: 60 %
Potassium: 3.4 mmol/L — ABNORMAL LOW (ref 3.5–5.1)
Potassium: 3.4 mmol/L — ABNORMAL LOW (ref 3.5–5.1)
Sodium: 140 mmol/L (ref 135–145)
Sodium: 140 mmol/L (ref 135–145)
TCO2: 27 mmol/L (ref 22–32)
TCO2: 29 mmol/L (ref 22–32)
pCO2, Ven: 44.4 mmHg (ref 44.0–60.0)
pCO2, Ven: 48.5 mmHg (ref 44.0–60.0)
pH, Ven: 7.364 (ref 7.250–7.430)
pH, Ven: 7.367 (ref 7.250–7.430)
pO2, Ven: 32 mmHg (ref 32.0–45.0)
pO2, Ven: 32 mmHg (ref 32.0–45.0)

## 2020-10-21 LAB — POCT I-STAT 7, (LYTES, BLD GAS, ICA,H+H)
Acid-base deficit: 1 mmol/L (ref 0.0–2.0)
Bicarbonate: 23.8 mmol/L (ref 20.0–28.0)
Calcium, Ion: 1.19 mmol/L (ref 1.15–1.40)
HCT: 35 % — ABNORMAL LOW (ref 36.0–46.0)
Hemoglobin: 11.9 g/dL — ABNORMAL LOW (ref 12.0–15.0)
O2 Saturation: 90 %
Potassium: 3.3 mmol/L — ABNORMAL LOW (ref 3.5–5.1)
Sodium: 139 mmol/L (ref 135–145)
TCO2: 25 mmol/L (ref 22–32)
pCO2 arterial: 38.3 mmHg (ref 32.0–48.0)
pH, Arterial: 7.4 (ref 7.350–7.450)
pO2, Arterial: 60 mmHg — ABNORMAL LOW (ref 83.0–108.0)

## 2020-10-21 LAB — CBC
HCT: 36.3 % (ref 36.0–46.0)
Hemoglobin: 12.2 g/dL (ref 12.0–15.0)
MCH: 27.2 pg (ref 26.0–34.0)
MCHC: 33.6 g/dL (ref 30.0–36.0)
MCV: 81 fL (ref 80.0–100.0)
Platelets: 202 10*3/uL (ref 150–400)
RBC: 4.48 MIL/uL (ref 3.87–5.11)
RDW: 15.2 % (ref 11.5–15.5)
WBC: 5.6 10*3/uL (ref 4.0–10.5)
nRBC: 0 % (ref 0.0–0.2)

## 2020-10-21 LAB — ECHO TEE
MV M vel: 4.73 m/s
MV Peak grad: 89.5 mmHg
Radius: 0.9 cm

## 2020-10-21 LAB — CREATININE, SERUM
Creatinine, Ser: 0.86 mg/dL (ref 0.44–1.00)
GFR, Estimated: >60 mL/min (ref >60)

## 2020-10-21 SURGERY — ECHOCARDIOGRAM, TRANSESOPHAGEAL
Anesthesia: Monitor Anesthesia Care

## 2020-10-21 SURGERY — RIGHT/LEFT HEART CATH AND CORONARY ANGIOGRAPHY
Anesthesia: LOCAL

## 2020-10-21 MED ORDER — ONDANSETRON HCL 4 MG/2ML IJ SOLN: 4.0000 mg | Freq: Four times a day (QID) | INTRAMUSCULAR | Status: DC | PRN

## 2020-10-21 MED ORDER — FUROSEMIDE 10 MG/ML IJ SOLN
INTRAMUSCULAR | Status: DC | PRN
  Administered 2020-10-21: 40 mg via INTRAVENOUS

## 2020-10-21 MED ORDER — FUROSEMIDE 20 MG PO TABS
20.0000 mg | ORAL_TABLET | Freq: Every day | ORAL | Status: DC
  Administered 2020-10-21: 20 mg via ORAL
  Filled 2020-10-21: qty 1

## 2020-10-21 MED ORDER — VERAPAMIL HCL 2.5 MG/ML IV SOLN
INTRA_ARTERIAL | Status: DC | PRN
  Administered 2020-10-21 (×2): 5 mL via INTRA_ARTERIAL

## 2020-10-21 MED ORDER — LABETALOL HCL 5 MG/ML IV SOLN: 10.0000 mg | INTRAVENOUS | Status: AC | PRN

## 2020-10-21 MED ORDER — VITAMIN D 1000 UNITS PO TABS
1000.0000 [IU] | ORAL_TABLET | Freq: Every day | ORAL | Status: DC
  Administered 2020-10-22 – 2020-10-27 (×6): 1000 [IU] via ORAL
  Filled 2020-10-21 (×6): qty 1

## 2020-10-21 MED ORDER — HEPARIN (PORCINE) IN NACL 1000-0.9 UT/500ML-% IV SOLN
INTRAVENOUS | Status: AC
  Filled 2020-10-21: qty 500

## 2020-10-21 MED ORDER — METHOTREXATE 2.5 MG PO TABS: 15.0000 mg | ORAL_TABLET | ORAL | Status: DC

## 2020-10-21 MED ORDER — LEVOTHYROXINE SODIUM 25 MCG PO TABS
125.0000 ug | ORAL_TABLET | Freq: Every day | ORAL | Status: DC
  Administered 2020-10-22 – 2020-11-07 (×17): 125 ug via ORAL
  Filled 2020-10-21 (×18): qty 1

## 2020-10-21 MED ORDER — FUROSEMIDE 10 MG/ML IJ SOLN
INTRAMUSCULAR | Status: AC
  Filled 2020-10-21: qty 4

## 2020-10-21 MED ORDER — POTASSIUM CHLORIDE CRYS ER 20 MEQ PO TBCR
20.0000 meq | EXTENDED_RELEASE_TABLET | Freq: Every day | ORAL | Status: DC
  Administered 2020-10-21 – 2020-10-27 (×7): 20 meq via ORAL
  Filled 2020-10-21 (×7): qty 1

## 2020-10-21 MED ORDER — SODIUM CHLORIDE 0.9% FLUSH
3.0000 mL | INTRAVENOUS | Status: DC | PRN
  Administered 2020-10-22: 3 mL via INTRAVENOUS

## 2020-10-21 MED ORDER — SODIUM CHLORIDE 0.9 % IV SOLN
250.0000 mL | INTRAVENOUS | Status: DC | PRN
  Administered 2020-10-21: 250 mL via INTRAVENOUS

## 2020-10-21 MED ORDER — SODIUM CHLORIDE 0.9% FLUSH
3.0000 mL | Freq: Two times a day (BID) | INTRAVENOUS | Status: DC
  Administered 2020-10-21 – 2020-10-26 (×9): 3 mL via INTRAVENOUS

## 2020-10-21 MED ORDER — SODIUM CHLORIDE 0.9 % IV SOLN
INTRAVENOUS | Status: DC
Start: 2020-10-21 — End: 2020-10-21

## 2020-10-21 MED ORDER — FUROSEMIDE 10 MG/ML IJ SOLN
40.0000 mg | Freq: Two times a day (BID) | INTRAMUSCULAR | Status: DC
  Administered 2020-10-22 – 2020-10-23 (×3): 40 mg via INTRAVENOUS
  Filled 2020-10-21 (×3): qty 4

## 2020-10-21 MED ORDER — LIDOCAINE HCL (PF) 1 % IJ SOLN
INTRAMUSCULAR | Status: AC
  Filled 2020-10-21: qty 30

## 2020-10-21 MED ORDER — ASPIRIN EC 81 MG PO TBEC
81.0000 mg | DELAYED_RELEASE_TABLET | Freq: Once | ORAL | Status: AC
  Administered 2020-10-24: 81 mg via ORAL

## 2020-10-21 MED ORDER — SODIUM CHLORIDE 0.9 % WEIGHT BASED INFUSION: 3.0000 mL/kg/h | INTRAVENOUS | Status: DC

## 2020-10-21 MED ORDER — HEPARIN (PORCINE) IN NACL 1000-0.9 UT/500ML-% IV SOLN
INTRAVENOUS | Status: DC | PRN
  Administered 2020-10-21 (×2): 500 mL

## 2020-10-21 MED ORDER — SODIUM CHLORIDE 0.9 % WEIGHT BASED INFUSION: 1.0000 mL/kg/h | INTRAVENOUS | Status: DC

## 2020-10-21 MED ORDER — SODIUM CHLORIDE 0.9 % IV SOLN: INTRAVENOUS | Status: DC

## 2020-10-21 MED ORDER — AMLODIPINE BESYLATE 10 MG PO TABS
10.0000 mg | ORAL_TABLET | Freq: Every day | ORAL | Status: DC
  Administered 2020-10-22 – 2020-10-27 (×6): 10 mg via ORAL
  Filled 2020-10-21 (×6): qty 1

## 2020-10-21 MED ORDER — LIDOCAINE 2% (20 MG/ML) 5 ML SYRINGE
INTRAMUSCULAR | Status: DC | PRN
  Administered 2020-10-21: 40 mg via INTRAVENOUS

## 2020-10-21 MED ORDER — PROPOFOL 500 MG/50ML IV EMUL
INTRAVENOUS | Status: DC | PRN
  Administered 2020-10-21: 75 ug/kg/min via INTRAVENOUS

## 2020-10-21 MED ORDER — IOHEXOL 350 MG/ML SOLN
INTRAVENOUS | Status: DC | PRN
Start: 2020-10-21 — End: 2020-10-21
  Administered 2020-10-21: 45 mL via INTRA_ARTERIAL

## 2020-10-21 MED ORDER — RAMIPRIL 2.5 MG PO CAPS
10.0000 mg | ORAL_CAPSULE | Freq: Two times a day (BID) | ORAL | Status: DC
  Administered 2020-10-21 – 2020-10-27 (×12): 10 mg via ORAL
  Filled 2020-10-21: qty 1
  Filled 2020-10-21: qty 4
  Filled 2020-10-21: qty 1
  Filled 2020-10-21 (×4): qty 4
  Filled 2020-10-21: qty 1
  Filled 2020-10-21: qty 4
  Filled 2020-10-21 (×2): qty 1
  Filled 2020-10-21 (×2): qty 4
  Filled 2020-10-21: qty 1
  Filled 2020-10-21 (×4): qty 4

## 2020-10-21 MED ORDER — HEPARIN SODIUM (PORCINE) 1000 UNIT/ML IJ SOLN
INTRAMUSCULAR | Status: AC
  Filled 2020-10-21: qty 1

## 2020-10-21 MED ORDER — VERAPAMIL HCL 2.5 MG/ML IV SOLN
INTRAVENOUS | Status: AC
  Filled 2020-10-21: qty 2

## 2020-10-21 MED ORDER — HEPARIN SODIUM (PORCINE) 5000 UNIT/ML IJ SOLN
5000.0000 [IU] | Freq: Three times a day (TID) | INTRAMUSCULAR | Status: DC
  Administered 2020-10-21 – 2020-10-22 (×2): 5000 [IU] via SUBCUTANEOUS
  Filled 2020-10-21 (×3): qty 1

## 2020-10-21 MED ORDER — SODIUM CHLORIDE 0.9% FLUSH
3.0000 mL | Freq: Two times a day (BID) | INTRAVENOUS | Status: DC
  Administered 2020-10-21 – 2020-10-26 (×5): 3 mL via INTRAVENOUS

## 2020-10-21 MED ORDER — HYDRALAZINE HCL 20 MG/ML IJ SOLN: 10.0000 mg | INTRAMUSCULAR | Status: AC | PRN

## 2020-10-21 MED ORDER — MORPHINE SULFATE (PF) 2 MG/ML IV SOLN
2.0000 mg | INTRAVENOUS | Status: DC | PRN
Start: 2020-10-21 — End: 2020-10-28

## 2020-10-21 MED ORDER — ACETAMINOPHEN 325 MG PO TABS: 650.0000 mg | ORAL_TABLET | ORAL | Status: DC | PRN

## 2020-10-21 MED ORDER — NITROGLYCERIN 1 MG/10 ML FOR IR/CATH LAB
INTRA_ARTERIAL | Status: AC
  Filled 2020-10-21: qty 10

## 2020-10-21 MED ORDER — LIDOCAINE HCL (PF) 1 % IJ SOLN
INTRAMUSCULAR | Status: DC | PRN
  Administered 2020-10-21: 2 mL
  Administered 2020-10-21: 20 mL
  Administered 2020-10-21: 2 mL

## 2020-10-21 MED ORDER — SODIUM CHLORIDE 0.9 % IV SOLN: 250.0000 mL | INTRAVENOUS | Status: DC | PRN

## 2020-10-21 MED ORDER — METOPROLOL TARTRATE 12.5 MG HALF TABLET
37.5000 mg | ORAL_TABLET | Freq: Two times a day (BID) | ORAL | Status: DC
  Administered 2020-10-21 – 2020-10-26 (×10): 37.5 mg via ORAL
  Filled 2020-10-21 (×11): qty 3

## 2020-10-21 MED ORDER — PROPOFOL 10 MG/ML IV BOLUS
INTRAVENOUS | Status: DC | PRN
  Administered 2020-10-21 (×3): 25 mg via INTRAVENOUS

## 2020-10-21 MED ORDER — SODIUM CHLORIDE 0.9% FLUSH
3.0000 mL | INTRAVENOUS | Status: DC | PRN
Start: 2020-10-21 — End: 2020-10-21

## 2020-10-21 MED ORDER — ASPIRIN 81 MG PO CHEW: 81.0000 mg | CHEWABLE_TABLET | ORAL | Status: DC

## 2020-10-21 MED ORDER — ACETAMINOPHEN 325 MG PO TABS
650.0000 mg | ORAL_TABLET | Freq: Four times a day (QID) | ORAL | Status: DC | PRN
  Administered 2020-10-24 (×2): 650 mg via ORAL
  Filled 2020-10-21 (×2): qty 2

## 2020-10-21 MED ORDER — CALCIUM CARBONATE-VITAMIN D 500-200 MG-UNIT PO TABS
1.0000 | ORAL_TABLET | Freq: Every day | ORAL | Status: DC
  Administered 2020-10-22 – 2020-10-27 (×6): 1 via ORAL
  Filled 2020-10-21 (×6): qty 1

## 2020-10-21 SURGICAL SUPPLY — 16 items
CATH BALLN WEDGE 5F 110CM (CATHETERS) ×1 IMPLANT
CATH INFINITI 5FR MULTPACK ANG (CATHETERS) ×1 IMPLANT
CATH OPTITORQUE TIG 4.0 5F (CATHETERS) ×1 IMPLANT
CLOSURE MYNX CONTROL 5F (Vascular Products) ×1 IMPLANT
DEVICE RAD TR BAND REGULAR (VASCULAR PRODUCTS) ×1 IMPLANT
GLIDESHEATH SLEND A-KIT 6F 22G (SHEATH) ×1 IMPLANT
GUIDEWIRE .025 260CM (WIRE) ×1 IMPLANT
KIT HEART LEFT (KITS) ×2 IMPLANT
PACK CARDIAC CATHETERIZATION (CUSTOM PROCEDURE TRAY) ×2 IMPLANT
SHEATH GLIDE SLENDER 4/5FR (SHEATH) ×1 IMPLANT
SHEATH PINNACLE 5F 10CM (SHEATH) ×1 IMPLANT
SHEATH PROBE COVER 6X72 (BAG) ×1 IMPLANT
TRANSDUCER W/STOPCOCK (MISCELLANEOUS) ×2 IMPLANT
TUBING CIL FLEX 10 FLL-RA (TUBING) ×2 IMPLANT
WIRE EMERALD 3MM-J .035X150CM (WIRE) ×1 IMPLANT
WIRE HI TORQ VERSACORE-J 145CM (WIRE) ×1 IMPLANT

## 2020-10-21 NOTE — Anesthesia Postprocedure Evaluation (Signed)
Anesthesia Post Note  Patient: Joanna Hood  Procedure(s) Performed: TRANSESOPHAGEAL ECHOCARDIOGRAM (TEE) (N/A )     Patient location during evaluation: Endoscopy Anesthesia Type: MAC Level of consciousness: awake Pain management: pain level controlled Vital Signs Assessment: post-procedure vital signs reviewed and stable Respiratory status: spontaneous breathing, nonlabored ventilation, respiratory function stable and patient connected to nasal cannula oxygen Cardiovascular status: stable and blood pressure returned to baseline Postop Assessment: no apparent nausea or vomiting Anesthetic complications: no   No complications documented.  Last Vitals:  Vitals:   10/21/20 1209 10/21/20 1222  BP: (!) 145/105 (!) 147/68  Pulse: 62 60  Resp:    Temp:    SpO2: 90% 93%    Last Pain:  Vitals:   10/21/20 1146  TempSrc:   PainSc: 0-No pain                 Ariya Bohannon P Bryanah Sidell

## 2020-10-21 NOTE — Consult Note (Addendum)
Advanced Heart Failure Team Consult Note   Primary Physician: Ria Bush, MD PCP-Cardiologist:  Dr. Gwenlyn Found   Reason for Consultation: Acute on Chronic Diastolic HF/ RV Dysfunction + Severe MR    HPI:    Joanna Hood is seen today for evaluation of acute on chronic diastolic HF/ RV dysfunction + severe MR, at the request of Dr. Gwenlyn Found, Cardiology.  74 y/o AAF, followed by Dr. Gwenlyn Found, w/ h/o chronic diastolic heart failure w/ predominant RV dysfunction + pulmonary hypertension, chronic atrial fibrillation on Eliquis, mitral regurgitation,  systemic HTN and rheumatoid arthritis on Methotrexate.    2D Echo 8/21 showed normal LVEF, 60-65%, w/ mild LVH and G3DD c/w restrictive physiology. RV was mildly reduced at the time w/ severely elevated pulmonary artery pressure at 78 mmHg. LA size was severely dilated, RA mildly dilated. Also w/ findings c/w severe MR. Had f/u w/ cardiology 9/21 and, at the time, was only having mild symptoms w/ exertion, NYHA Class II. Plan was to follow closely.   She had recent f/u w/ Dr. Gwenlyn Found and noted progressive symptoms. He arranged for TEE and R/LHC, in preporation for mitral valve repair/replacement.   TEE today showed severe MR. LVEF is normal. Severe LAE noted; no LAA thrombus; RV systolic function is moderately reduced w/ mild-mod TR.   LHC today showed normal coronaries. RHC consistent with mitral valve regurgitation. Hemodynamics outlined below.   Right atrial pressure- mean 16 Right ventricular pressure- systolic 97, diastolic 6, mean 15 Pulmonary artery pressure- systolic 80, diastolic 32, mean 51 Pulmonary wedge pressure- V wave 42, mean 34 LVEDP-16 Cardiac output- 4.9 L/min with an index of 2.8 L/min/m.  CT surgery has been consulted for potential minimally invasive mitral valve repair. AHF team also asked to evaluate.   She was given a dose of 40 mg IV Lasix x 1 in the cath lab w/ good UOP thus far.  She reports recent progression  to NYHA Class IIIb symptoms w/ orthhopnea, sleeps on 2 pillows. She is currently on 3L [place] w/ O2 sats 97%. No supp O2 requirements at baseline.    2D Echo 02/19/20 1. Left ventricular ejection fraction, by estimation, is 60 to 65%. The left ventricle has normal function. The left ventricle has no regional wall motion abnormalities. There is mild left ventricular hypertrophy. Left ventricular diastolic parameters are consistent with Grade III diastolic dysfunction (restrictive). 2. Right ventricular systolic function is mildly reduced. The right ventricular size is normal. There is severely elevated pulmonary artery systolic pressure. The estimated right ventricular systolic pressure is 71.2 mmHg. 3. Left atrial size was severely dilated. 4. Right atrial size was mildly dilated. 5. The aortic valve is tricuspid. Aortic valve regurgitation is not visualized. Mild aortic valve sclerosis is present, with no evidence of aortic valve stenosis. 6. The inferior vena cava is normal in size with <50% respiratory variability, suggesting right atrial pressure of 8 mmHg. 7. The mitral valve is abnormal. Eccentric MR jet is poorly visualized on current study. However horizontal color Doppler splay is seen at the mitral valve, which is often seen in severe MR. There is further evidence of severe MR with marked LA enlargement and very high E-wave velocity. Recommend TEE for further evaluation of mitral regurgitation   RHC 10/21/20 Right Heart Pressures Hemodynamic findings consistent with mitral valve regurgitation. Right atrial pressure- mean 16 Right ventricular pressure- systolic 97, diastolic 6, mean 15 Pulmonary artery pressure- systolic 80, diastolic 32, mean 51 Pulmonary wedge pressure- V wave 42,  mean 34 LVEDP-16 Cardiac output- 4.9 L/min with an index of 2.8 L/min/m.    TEE 10/21/20 Normal LV function; severe LAE; no LAA thrombus; moderate RV dysfunction; mild bileaflet MVP with severe MR; mild  to moderate TR.  Review of Systems: [y] = yes, _0  = no   . General: Weight gain _1 ; Weight loss _2 ; Anorexia _3 ; Fatigue _4 ; Fever _5 ; Chills _6 ; Weakness _7   . Cardiac: Chest pain/pressure _8 ; Resting SOB [ Y]; Exertional SOB [Y ]; Orthopnea [Y ]; Pedal Edema _9 ; Palpitations _10 ; Syncope _11 ; Presyncope _12 ; Paroxysmal nocturnal dyspnea_13   . Pulmonary: Cough _14 ; Wheezing_15 ; Hemoptysis_16 ; Sputum _17 ; Snoring _18   . GI: Vomiting_19 ; Dysphagia_20 ; Melena_21 ; Hematochezia _22 ; Heartburn_23 ; Abdominal pain _24 ; Constipation _25 ; Diarrhea _26 ; BRBPR _27   . GU: Hematuria_28 ; Dysuria _29 ; Nocturia_30   . Vascular: Pain in legs with walking _31 ; Pain in feet with lying flat _32 ; Non-healing sores _33 ; Stroke _34 ; TIA _35 ; Slurred speech _36 ;  . Neuro: Headaches_37 ; Vertigo_38 ; Seizures_39 ; Paresthesias_40 ;Blurred vision _41 ; Diplopia _42 ; Vision changes _43   . Ortho/Skin: Arthritis [Y ]; Joint pain [Y ]; Muscle pain _44 ; Joint swelling _45 ; Back Pain _46 ; Rash _47   . Psych: Depression_48 ; Anxiety_49   . Heme: Bleeding problems _50 ; Clotting disorders _51 ; Anemia _52   . Endocrine: Diabetes _53 ; Thyroid dysfunction_54   Home Medications Prior to Admission medications   Medication Sig Start Date End Date Taking? Authorizing Provider  acetaminophen (TYLENOL) 325 MG tablet Take 650 mg by mouth every 6 (six) hours as needed (pain).   Yes [provider]  amLODipine (NORVASC) 10 MG tablet Take 1 tablet (10 mg total) by mouth daily. Patient taking differently: Take 10 mg by mouth in the morning. 09/17/20  Yes Ria Bush, MD  aspirin EC 81 MG tablet Take 81 mg by mouth once. Swallow whole. 10/21/20  Yes [provider]  Calcium Carb-Cholecalciferol (CALCIUM-VITAMIN D) 600-400 MG-UNIT TABS Take 1 tablet by mouth in the morning.   Yes [provider]  cholecalciferol (VITAMIN D) 1000 UNITS tablet Take 1,000 Units by mouth in the morning.   Yes [provider]   ELIQUIS 5 MG TABS tablet TAKE 1 TABLET TWICE DAILY Patient taking differently: Take 5 mg by mouth 2 (two) times daily. 07/16/20  Yes Lorretta Harp, MD  furosemide (LASIX) 20 MG tablet TAKE 1 TABLET EVERY DAY Patient taking differently: Take 40 mg by mouth in the morning. 06/13/20  Yes Lorretta Harp, MD  levothyroxine (SYNTHROID) 125 MCG tablet Take 1 tablet (125 mcg total) by mouth daily before breakfast. 09/17/20  Yes Ria Bush, MD  methotrexate (RHEUMATREX) 2.5 MG tablet Take 15 mg by mouth every Friday. Caution:Chemotherapy. Protect from light.   Yes [provider]  metoprolol tartrate 37.5 MG TABS Take 37.5 mg by mouth 2 (two) times daily. 09/17/20  Yes Ria Bush, MD  potassium chloride SA (KLOR-CON) 20 MEQ tablet TAKE 1 TABLET EVERY DAY Patient taking differently: Take 20 mEq by mouth in the morning. 06/10/20  Yes Lorretta Harp, MD  ramipril (ALTACE) 10 MG capsule TAKE 1 CAPSULE TWICE DAILY Patient taking differently: Take 10 mg by mouth 2 (two) times daily. 09/26/18  Yes Lorretta Harp, MD  fluticasone (FLONASE) 50 MCG/ACT nasal spray Place 2 sprays into both nostrils daily. Patient taking differently: Place 2 sprays into both nostrils daily as needed for allergies. 06/20/20   Melynda Ripple, MD    Past Medical History: Past Medical History:  Diagnosis Date  . Atrial fibrillation with RVR ([place]) 2014   persistent  . Chronic diastolic CHF (congestive heart failure) (HCC)    HTN  . COVID-19 virus infection 01/20/2019  . ESOPHAGEAL STRICTURE 02/19/2005   Qualifier: Diagnosis of  By: Amil Amen MD, Benjamine Mola    . Goiter   . Heart murmur   . History of anemia    unclear cause, now resolved  . History of chicken pox   . History of colon polyps    benign  . HLD (hyperlipidemia)   . Hypothyroidism   . Malignant hypertension longstanding  . Microcytosis   . Osteopenia 12/2013   T -2.3 hip  . Osteoporosis   . Rheumatoid arthritis ([place]) 11/2015    +RF, +CCP, ESR 49, synovitis on exam and [place] 11/2015 [facility])  . Right lower lobe pneumonia 07/24/2014  . SOB (shortness of breath) 04/29/2013  . Vitamin D deficiency     Past Surgical History: Past Surgical History:  Procedure Laterality Date  . CARDIAC CATHETERIZATION  2014   normal per patient  . COLONOSCOPY  11/2015   TA, int hem, rpt 5 yrs [facility])  . LEFT HEART CATHETERIZATION WITH CORONARY ANGIOGRAM N/A 05/01/2013   Procedure: LEFT HEART CATHETERIZATION WITH CORONARY ANGIOGRAM;  Surgeon: Leonie Man, MD;  Location: [facility] CATH LAB;  Service: Cardiovascular;  Laterality: N/A;  . PARTIAL HYSTERECTOMY  1970   fibroids    Family History: Family History  Problem Relation Age of Onset  . Cancer Mother        ovarian  . Sudden death Mother   . Diabetes Sister   . Cancer Maternal Grandmother        lung  . Heart disease Maternal Grandmother   . Hypertension Sister   . Hypertension Cousin   . CAD Neg Hx   . Stroke Neg Hx     Social History: Social History   Socioeconomic History  . Marital status: Single    Spouse name: Not on file  . Number of children: 1  . Years of education: Not on file  . Highest education level: Not on file  Occupational History  . Occupation: Retired  Tobacco Use  . Smoking status: Former Smoker    Quit date: 05/16/1969    Years since quitting: 51.4  . Smokeless tobacco: Former Network engineer  . Vaping Use: Never used  Substance and Sexual Activity  . Alcohol use: No    Alcohol/week: 0.0 standard drinks  . Drug use: No  . Sexual activity: Never  Other Topics Concern  . 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 Difficulty of Paying Living Expenses: Not hard at all  Food Insecurity: No Food Insecurity  . Worried About Ship broker in the Last Year: Never true  . Ran Out of Food in the Last Year: Never true  Transportation Needs: No Transportation Needs  . Lack of Transportation (Medical): No  . Lack of Transportation (Non-Medical): No  Physical Activity: Inactive  .  Days of Exercise per Week: 0 days  . Minutes of Exercise per Session: 0 min  Stress: No Stress Concern Present  . Feeling of Stress : Not at all  Social Connections: Not on file    Allergies:  Allergies  Allergen Reactions  . Metronidazole Itching and Rash    Objective:    Vital Signs:   Temp:  [97 F (36.1 C)-97.2 F (36.2 C)] 97 F (36.1 C) (05/02 0810) Pulse Rate:  [46-77] 60 (05/02 1222) Resp:  [15-26] 26 (05/02 0930) BP: (103-160)/(48-105) 147/68 (05/02 1222) SpO2:  [90 %-98 %] 93 % (05/02 1222) Weight:  [73 kg] 73 kg (05/02 0725)    Weight change: Filed Weights   10/21/20 0725  Weight: 73 kg    Intake/Output:   Intake/Output Summary (Last 24 hours) at 10/21/2020 1259 Last data filed at 10/21/2020 0813 Gross per 24 hour  Intake 200 ml  Output --  Net 200 ml      Physical Exam    General:  Well appearing. No resp difficulty HEENT: normal Neck: supple. JVP elevated to jaw . Carotids 2+ bilat; no bruits. No lymphadenopathy or thyromegaly appreciated. Cor: PMI nondisplaced. Irregularly irregular rhythm and rate. 3/6 MR murmur at apex  Lungs: decreased BS at the bases bilaterally  Abdomen: soft, nontender, nondistended. No hepatosplenomegaly. No bruits or masses. Good bowel sounds. Extremities: no cyanosis, clubbing, rash, edema Neuro: alert & orientedx3, cranial nerves grossly intact. moves all 4 extremities w/o difficulty. Affect pleasant   Telemetry   Afib, rate controlled 70s   EKG    No new EKG to review today, has chronic Afib.  Labs   Basic Metabolic Panel: Recent Labs  Lab 10/21/20 1024 10/21/20 1025 10/21/20 1038  NA 140 140 139  K 3.4* 3.4* 3.3*    Liver Function Tests: No results for  input(s): AST, ALT, ALKPHOS, BILITOT, PROT, ALBUMIN in the last 168 hours. No results for input(s): LIPASE, AMYLASE in the last 168 hours. No results for input(s): AMMONIA in the last 168 hours.  CBC: Recent Labs  Lab 10/21/20 1024 10/21/20 1025 10/21/20 1038  HGB 12.2 12.2 11.9*  HCT 36.0 36.0 35.0*    Cardiac Enzymes: No results for input(s): CKTOTAL, CKMB, CKMBINDEX, TROPONINI in the last 168 hours.  BNP: BNP (last 3 results) No results for input(s): BNP in the last 8760 hours.  ProBNP (last 3 results) No results for input(s): PROBNP in the last 8760 hours.   CBG: No results for input(s): GLUCAP in the last 168 hours.  Coagulation Studies: No results for input(s): LABPROT, INR in the last 72 hours.   Imaging   CARDIAC CATHETERIZATION  Result Date: 10/21/2020  Hemodynamic findings consistent with mitral valve regurgitation.  Tyreanna Amabel Stmarie is a 75 y.o. female  [id number] LOCATION:  FACILITY: [facility] PHYSICIAN: Quay Burow, M.D. Oct 25, 1945 DATE OF PROCEDURE:  10/21/2020 DATE OF DISCHARGE: CARDIAC CATHETERIZATION History obtained from chart review.Shlonda Sheralee Qazi is a 75 y.o.  moderately overweight African-American female who I initially met several years ago.I last saw her in the office 03/19/2020.Marland KitchenWe first [place] she was admitted to [facility] with diastolic heart failure, hypertension and atrial fibrillation. She ultimately underwent cardiac catheterization revealing essentially normal coronary arteries and normal LV function by Dr. Ellyn Hack. Her A. Fib converted to sinus rhythm with the aid of IV Cardizem. Her blood pressure was better controlled. She saw Ellen Henri PA-C in the office and again was in atrial fibrillation at which time her beta blocker  was adjusted and Eliquis was added because of increased CHA2DSVASC2 score of 2. Since I saw her in the office she remained clinically stable. She denies chest pain or shortness of breath.  She remains  in A. fib on Eliquis oral anticoagulation. She did contract COVID-19 in late July2020and completed her quarantine a week ago. She had mild flulike symptoms. She did have a 2D echo performed 05/08/2015 that showed normal LV function with moderate mitral regurgitation and severe left atrial enlargement.  When I saw her 6 months ago she was complaining of only mild dyspnea.She did have a 2D echo performed 02/19/2020 that revealed normal LV size and function, mild LVH, grade 3 diastolic dysfunction, severe left atrial enlargement and severe mitral regurgitation.  Since I saw her 6 months ago she was doing well until several weeks ago when she noticed increasing lower extreme edema and progressive dyspnea on exertion.   Ms. 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  She has normal LV function by 2D echo/TEE performed today.  Cardiac cath revealed normal coronary arteries.  She is symptomatic.  A right femoral angiogram was performed and a Mynx closure device was successfully deployed achieving hemostasis.  The right radial sheath was removed and a TR band was placed to achieve patent hemostasis.  The patient did receive 40 mg of IV furosemide at the end of the case.  She left the lab in stable condition.  She will start her Eliquis again tomorrow morning.  She has chronic A. fib.  I will see her back next week.  In the meantime, I have communicated these results to Dr. Roxy Manns , cardiothoracic surgery in anticipation of potential minimally invasive mitral valve repair. Quay Burow. MD, [facility] 10/21/2020 10:59 AM      Medications:     Current Medications: . amLODipine  10 mg Oral Daily  . aspirin EC  81 mg Oral Once  . Calcium-Vitamin D  1 tablet Oral q AM  . cholecalciferol  1,000 Units Oral q AM  . furosemide  20 mg Oral Daily  . heparin  5,000 Units Subcutaneous Q8H  . [START ON 10/22/2020] levothyroxine  125 mcg Oral QAC breakfast   . [START ON 10/25/2020] methotrexate  15 mg Oral Q Fri  . Metoprolol Tartrate  37.5 mg Oral BID  . potassium chloride SA  20 mEq Oral Daily  . ramipril  10 mg Oral BID  . sodium chloride flush  3 mL Intravenous Q12H  . sodium chloride flush  3 mL Intravenous Q12H     Infusions: . sodium chloride    . sodium chloride         Patient Profile   [name], followed by Dr. Gwenlyn Found, w/ h/o chronic diastolic heart failure w/ predominant RV dysfunction + pulmonary hypertension, chronic atrial fibrillation on Eliquis, severe mitral regurgitation, systemic HTN and rheumatoid arthritis on Methotrexate, being evaluated for mitral valve repair vs replacement.   Assessment/Plan   1. Severe Mitral Regurgitation  - Primary MR. LVEF normal 60-65%  - TEE today confirmed severe MR. LHC w/ normal coronaries - CT surgery consulted for potential minimally invasive mitral valve repair  2. Severe Pulmonary Hypertension  - TTE 8/21 w/ severely elevated RVSP, 78 mmHg. RV mildly reduced - TEE 10/21/20 w/ moderately reduced RV + mild-mod TR - RVSP on RHC severely elevated at 97 mmHg - suspect primarily WHO group 2, in the setting of severe Lt sided heart/valvular disease  but also ? WHO Group 1 2/2 CTD (RA)  - she will need repeat echo post MVR to reassess RVSP. If still elevated post valve repair, will need additional w/u to exclude other etiologies, including PFTs, V/Q scan, sleep study   3. Acute on Chronic Diastolic Heart Failure w/ Predominant RV Dysfunction  - 2D Echo 8/21 showed normal LVEF, 60-65%, w/ mild LVH and G3DD c/w restrictive physiology. RV was mildly reduced at the time - TEE today LVEF normal. RV systolic function is now moderately reduced w/ mild-mod TR, in setting of progressive/ now severe MR. - RHC today; RAP 16, LVEDP 16. Cardiac output- 4.9 L/min with an index of 2.8 L/min/m. - Continue IV Lasix for diuresis  - Continue  blocker and home antihypertensives for BP/HR control   4.  Chronic Afib: in setting of severe MR w/ severe LAE. - rate controlled w/ metoprolol  - on Eliquis for a/c. Per Dr. Gwenlyn Found, ok to resume tomorrow morning   5. Rheumatoid Arthritis - on MTX - followed by rheumatology   Length of Stay: 0  Lyda Jester, PA-C  10/21/2020, 12:59 PM  Advanced Heart Failure Team Pager [phone] (M-F; 7a - 5p)  Please contact [facility] for night-coverage after hours (4p -7a ) and weekends on [email or web address]  Patient seen and examined with the above-signed Advanced Practice Provider and/or Housestaff. I personally reviewed laboratory data, imaging studies and relevant notes. I independently examined the patient and formulated the important aspects of the plan. I have edited the note to reflect any of my changes or salient points. I have personally discussed the plan with the patient and/or family.  75 y/o woman as above with diastolic HF, severe MR, chronic LAE and RA.  Admitted with progressive NYHA IIIB-IV HF symptoms   TEE today with EF 55-60% mild bileaflet MVP with severe central MR. Moderate RV dysfunction with moderate TR. Cath with no CAD. LVEDP 16 PCWP with v-waves to 42 CO ok.   Dyspneic on any exertion.   General:  Elderly woman lying in bede HEENT: normal Neck: supple. JVP to jaw  Carotids 2+ bilat; no bruits. No lymphadenopathy or thryomegaly appreciated. Cor: PMI nondisplaced. Irregular rate & rhythm. 3/6 MR Lungs: clear Abdomen: soft, nontender, nondistended. No hepatosplenomegaly. No bruits or masses. Good bowel sounds. Extremities: no cyanosis, clubbing, rash, tr-1+ edema Neuro: alert & orientedx3, cranial nerves grossly intact. moves all 4 extremities w/o difficulty. Affect pleasant  She has severe, symptomatic MR due to MVP and severe LAE. Will plan IV diuresis. Consult structural heart team for MV clip.   Glori Bickers, MD  3:23 PM

## 2020-10-21 NOTE — Interval H&P Note (Signed)
History and Physical Interval Note:  10/21/2020 7:13 AM  Joanna Hood  has presented today for surgery, with the diagnosis of [place].  The various methods of treatment have been discussed with the patient and family. After consideration of risks, benefits and other options for treatment, the patient has consented to  Procedure(s): TRANSESOPHAGEAL ECHOCARDIOGRAM (TEE) (N/A) as a surgical intervention.  The patient's history has been reviewed, patient examined, no change in status, stable for surgery.  I have reviewed the patient's chart and labs.  Questions were answered to the patient's satisfaction.     Kirk Ruths

## 2020-10-21 NOTE — Anesthesia Preprocedure Evaluation (Addendum)
Anesthesia Evaluation  Patient identified by MRN, date of birth, ID band Patient awake    Reviewed: Allergy & Precautions, NPO status , Patient's Chart, lab work & pertinent test results  Airway Mallampati: III  TM Distance: >3 FB Neck ROM: Full    Dental  (+) Poor Dentition, Loose, Missing,    Pulmonary former smoker,    Pulmonary exam normal breath sounds clear to auscultation       Cardiovascular hypertension, Pt. on medications and Pt. on home beta blockers +CHF  + dysrhythmias + Valvular Problems/Murmurs MR  Rhythm:Regular Rate:Normal + Diastolic murmurs ECG: a-fib  ECHO: 1. Left ventricular ejection fraction, by estimation, is 60 to 65%. The left ventricle has normal function. The left ventricle has no regional wall motion abnormalities. There is mild left ventricular hypertrophy. Left ventricular diastolic parameters are consistent with Grade III diastolic dysfunction (restrictive). 2. Right ventricular systolic function is mildly reduced. The right ventricular size is normal. There is severely elevated pulmonary artery systolic pressure. The estimated right ventricular systolic pressure is 41.7 mmHg. 3. Left atrial size was severely dilated. 4. Right atrial size was mildly dilated. 5. The aortic valve is tricuspid. Aortic valve regurgitation is not visualized. Mild aortic valve sclerosis is present, with no evidence of aortic valve stenosis. 6. The inferior vena cava is normal in size with <50% respiratory variability, suggesting right atrial pressure of 8 mmHg. 7. The mitral valve is abnormal. Eccentric MR jet is poorly visualized on current study. However horizontal color Doppler splay is seen at the mitral valve, which is often seen in severe MR. There is further evidence of severe MR with marked LA enlargement and very high E-wave velocity. Recommend TEE for further evaluation of mitral regurgitation    Neuro/Psych negative neurological ROS  negative psych ROS   GI/Hepatic negative GI ROS, Neg liver ROS,   Endo/Other  Hypothyroidism   Renal/GU negative Renal ROS     Musculoskeletal  (+) Arthritis ,   Abdominal   Peds  Hematology negative hematology ROS (+)   Anesthesia Other Findings MITRAL VALVE REGURGIGATION  Reproductive/Obstetrics                            Anesthesia Physical Anesthesia Plan  ASA: III  Anesthesia Plan: MAC   Post-op Pain Management:    Induction: Intravenous  PONV Risk Score and Plan: 2 and Propofol infusion and Treatment may vary due to age or medical condition  Airway Management Planned: Nasal Cannula  Additional Equipment:   Intra-op Plan:   Post-operative Plan:   Informed Consent: I have reviewed the patients History and Physical, chart, labs and discussed the procedure including the risks, benefits and alternatives for the proposed anesthesia with the patient or authorized representative who has indicated his/her understanding and acceptance.     Dental advisory given  Plan Discussed with: CRNA  Anesthesia Plan Comments:        Anesthesia Quick Evaluation

## 2020-10-21 NOTE — Anesthesia Procedure Notes (Signed)
Procedure Name: MAC Date/Time: 10/21/2020 7:40 AM Performed by: Colin Benton, CRNA Pre-anesthesia Checklist: Patient identified, Emergency Drugs available, Suction available and Patient being monitored Patient Re-evaluated:Patient Re-evaluated prior to induction Oxygen Delivery Method: Nasal cannula Induction Type: IV induction Airway Equipment and Method: Bite block Placement Confirmation: positive ETCO2 Dental Injury: Teeth and Oropharynx as per pre-operative assessment

## 2020-10-21 NOTE — H&P (Signed)
Office Visit  10/09/2020 [facility] Seymour Bars, MD  Cardiology  HTN (hypertension), malignant +1 more  Dx  Edema. Shortness of Breath; Referred by Ria Bush, MD  Reason for Visit    Additional Documentation  Vitals:  BP 150/70Important   Pulse 58Important   Ht $R'5\' 4"'cG$  (1.626 m)  Wt 73 kg  SpO2 96%  BMI 27.64 kg/m  BSA 1.82 m    More Vitals  Flowsheets:  NEWS,  MEWS Score,  Anthropometrics,  Method of Visit    Encounter Info:  Billing Info,  History,  Allergies,  Detailed Report     All Notes   Progress Notes by Lorretta Harp, MD at 10/09/2020 10:45 AM  Author: Lorretta Harp, MD Author Type: Physician Filed: 10/09/2020 12:04 PM  Note Status: Signed Cosign: Cosign Not Required Encounter Date: 10/09/2020  Editor: Lorretta Harp, MD (Physician)             Expand AllCollapse All       10/09/2020 Joanna Hood   [birth year]  [id number]  Primary Physician Ria Bush, MD Primary Cardiologist: Lorretta Harp MD [name], [place]  HPI:  Joanna Hood is a 75 y.o.  moderately overweight African-American female who I initially met several years ago.I last saw her in the office 03/19/2020.Marland KitchenWe first [place] she was admitted to [facility] with diastolic heart failure, hypertension and atrial fibrillation. She ultimately underwent cardiac catheterization revealing essentially normal coronary arteries and normal LV function by Dr. Ellyn Hack. Her A. Fib converted to sinus rhythm with the aid of IV Cardizem. Her blood pressure was better controlled. She saw Ellen Henri PA-C in the office and again was in atrial fibrillation at which time her beta blocker was adjusted and Eliquis was added because of increased CHA2DSVASC2 score of 2. Since I saw her in the office she remained clinically stable. She denies chest pain or shortness of breath.  She remains in A. fib on Eliquis oral  anticoagulation. She did contract COVID-19 in late July2020and completed her quarantine a week ago. She had mild flulike symptoms. She did have a 2D echo performed 05/08/2015 that showed normal LV function with moderate mitral regurgitation and severe left atrial enlargement.  When I saw her 6 months ago she was complaining of only mild dyspnea.She did have a 2D echo performed 02/19/2020 that revealed normal LV size and function, mild LVH, grade 3 diastolic dysfunction, severe left atrial enlargement and severe mitral regurgitation.  Since I saw her 6 months ago she was doing well until several weeks ago when she noticed increasing lower extreme edema and progressive dyspnea on exertion.    Active Medications      Current Meds  Medication Sig  . amLODipine (NORVASC) 10 MG tablet Take 1 tablet (10 mg total) by mouth daily.  . Calcium Carb-Cholecalciferol (CALCIUM-VITAMIN D) 600-400 MG-UNIT TABS Take 1 tablet by mouth daily.  . cholecalciferol (VITAMIN D) 1000 UNITS tablet Take 1,000 Units by mouth daily.  Marland Kitchen ELIQUIS 5 MG TABS tablet TAKE 1 TABLET TWICE DAILY  . fluticasone (FLONASE) 50 MCG/ACT nasal spray Place 2 sprays into both nostrils daily. (Patient taking differently: Place 2 sprays into both nostrils as needed.)  . furosemide (LASIX) 20 MG tablet TAKE 1 TABLET EVERY DAY  . levothyroxine (SYNTHROID) 125 MCG tablet Take 1 tablet (125 mcg total) by mouth daily before breakfast.  . methotrexate (RHEUMATREX) 2.5 MG tablet Take 15 mg by mouth once a  week. Caution:Chemotherapy. Protect from light.  . metoprolol tartrate 37.5 MG TABS Take 37.5 mg by mouth 2 (two) times daily.  . potassium chloride SA (KLOR-CON) 20 MEQ tablet TAKE 1 TABLET EVERY DAY  . ramipril (ALTACE) 10 MG capsule TAKE 1 CAPSULE TWICE DAILY            Allergies  Allergen Reactions  . Metronidazole     REACTION: Rash, itching    Social History        Socioeconomic History  . Marital status: Single     Spouse name: Not on file  . Number of children: 1  . Years of education: Not on file  . Highest education level: Not on file  Occupational History  . Occupation: Retired  Tobacco Use  . Smoking status: Former Smoker    Quit date: 05/16/1969    Years since quitting: 51.4  . Smokeless tobacco: Former Network engineer  . Vaping Use: Never used  Substance and Sexual Activity  . Alcohol use: No    Alcohol/week: 0.0 standard drinks  . Drug use: No  . Sexual activity: Never  Other Topics Concern  . 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 Difficulty of Paying Living Expenses: Not hard at all  Food Insecurity: No Food Insecurity  . Worried About Charity fundraiser in the Last Year: Never true  . Ran Out of Food in the Last Year: Never true  Transportation Needs: No Transportation Needs  . Lack of Transportation (Medical): No  . Lack of Transportation (Non-Medical): No  Physical Activity: Inactive  . Days of Exercise per Week: 0 days  . Minutes of Exercise per Session: 0 min  Stress: No Stress Concern Present  . Feeling of Stress : Not at all  Social Connections: Not on file  Intimate Partner Violence: Not At Risk  . Fear of Current or Ex-Partner: No  . Emotionally Abused: No  . Physically Abused: No  . Sexually Abused: No     Review of Systems: General: negative for chills, fever, night sweats or weight changes.  Cardiovascular: negative for chest pain, dyspnea on exertion, edema, orthopnea, palpitations, paroxysmal nocturnal dyspnea or shortness of breath Dermatological: negative for rash Respiratory: negative for cough or wheezing Urologic: negative for hematuria Abdominal: negative for nausea, vomiting, diarrhea, bright red blood per rectum,  melena, or hematemesis Neurologic: negative for visual changes, syncope, or dizziness All other systems reviewed and are otherwise negative except as noted above.    Blood pressure (!) 150/70, pulse (!) 58, height $RemoveBe'5\' 4"'dVoJrAdPe$  (1.626 m), weight 161 lb (73 kg), SpO2 96 %.  General appearance: alert and no distress Neck: no adenopathy, no carotid bruit, no JVD, supple, symmetrical, trachea midline and thyroid not enlarged, symmetric, no tenderness/mass/nodules Lungs: clear to auscultation bilaterally Heart: irregularly irregular rhythm Extremities: Trace left ankle edema Pulses: 2+ and symmetric Skin: Skin color, texture, turgor normal. No rashes or lesions Neurologic: Alert and oriented X 3, normal strength and tone. Normal symmetric reflexes. Normal coordination and gait  EKG atrial fibrillation with a slow ventricular sponsor 58 and inferolateral T wave inversion with low limb voltage.  I personally reviewed this EKG.  ASSESSMENT AND PLAN:   HTN (hypertension), malignant History of essential hypertension  a blood pressure measured today 150/70.  She is on amlodipine and metoprolol as well as ramipril.  Mitral valve regurgitation History of severe MR by 2D echo performed 02/19/2020.  She also had severely dilated left atrium with an elevated right ventricular systolic pressure of 78 mmHg.  Over the last several weeks she is noted some lower extreme edema and some increasing dyspnea on exertion.  She is on furosemide 20 mg a day which I am going to increase to 40 mg a day.  She is also on Eliquis for persistent atrial fibrillation.  I am going to arrange for her to undergo transesophageal echo next week with right left heart cath (radial/brachial).  She may be a candidate for minimally invasive mitral valve repair.      Lorretta Harp MD FACP,FACC,FAHA, [facility] 10/09/2020 11:34 AM        For TEE; no changes Kirk Ruths

## 2020-10-21 NOTE — Interval H&P Note (Signed)
Cath Lab Visit (complete for each Cath Lab visit)  Clinical Evaluation Leading to the Procedure:   ACS: No.  Non-ACS:    Anginal Classification: No Symptoms  Anti-ischemic medical therapy: Minimal Therapy (1 class of medications)  Non-Invasive Test Results: No non-invasive testing performed  Prior CABG: No previous CABG      History and Physical Interval Note:  10/21/2020 9:54 AM  Joanna Hood  has presented today for surgery, with the diagnosis of MR.  The various methods of treatment have been discussed with the patient and family. After consideration of risks, benefits and other options for treatment, the patient has consented to  Procedure(s): RIGHT/LEFT HEART CATH AND CORONARY ANGIOGRAPHY (N/A) as a surgical intervention.  The patient's history has been reviewed, patient examined, no change in status, stable for surgery.  I have reviewed the patient's chart and labs.  Questions were answered to the patient's satisfaction.     Quay Burow

## 2020-10-21 NOTE — Progress Notes (Signed)
  Echocardiogram Echocardiogram Transesophageal has been performed.  Jennette Dubin 10/21/2020, 8:29 AM

## 2020-10-21 NOTE — Progress Notes (Signed)
    Transesophageal Echocardiogram Note  Joanna Hood [id number] 1946-05-22  Procedure: Transesophageal Echocardiogram Indications: Mitral regurgitation  Procedure Details Consent: Obtained Time Out: Verified patient identification, verified procedure, site/side was marked, verified correct patient position, special equipment/implants available, Radiology Safety Procedures followed,  medications/allergies/relevent history reviewed, required imaging and test results available.  Performed  Medications:  Pt sedated by anesthesia with diprovan 215 mg IV total.  Normal LV function; severe LAE; no LAA thrombus; moderate RV dysfunction; mild bileaflet MVP with severe MR; mild to moderate TR.   Complications: No apparent complications Patient did tolerate procedure well.  Kirk Ruths, MD

## 2020-10-21 NOTE — Progress Notes (Signed)
On arrival from cath lab, client c/o shortness of breath, O2 on at 2l/min via nasal cannula with sat 91%; lung sounds with wheezing and rhonchi bilat; Sharee Pimple, PA notified and will be in to see client

## 2020-10-21 NOTE — Consult Note (Incomplete)
[place] VALVE TEAM  Inpatient MitraClip Consultation:   Patient ID: Joanna Hood; [id number]; 1945-11-20   Admit date: 10/21/2020 Date of Consult: 10/21/2020  Primary Care Provider: Ria Bush, MD Primary Cardiologist: Dr. Gwenlyn Found Primary Electrophysiologist:  none   Patient Profile:   Kelsye Loomer is a 75 y.o. female with a hx of chronic atrial fibrillation on Eliquis, HTN, rheumatoid arthritis on methotrexate,  who is being seen today for the evaluation of severe MR at the request of Dr. [name].  History of Present Illness:   Joanna Hood ***  Her cardiac history dates back to   Echo 02/19/2020 showed EF 60-65%, mild LVH, G3DD (restrictive), severely elevated pulm pressures (RVSP 78.2 mm hg), severe LAE, mild RAE, and abnormal mitral valve with an eccentric MR jet poorly visualized and horizontal color doppler splay which is often seen with severe MR. There was further evidence of severe MR with a markedly dilated LA and very high E-wave velocity. TEE was recommended for further eval of mitral regurgitation.   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  L/RHC showed normal coronaries, LVEDP 16, PCWP with v waves to 42 and normal CO.    Past Medical History:  Diagnosis Date  . Atrial fibrillation with RVR ([place]) 2014   persistent  . Chronic diastolic CHF (congestive heart failure) (HCC)    HTN  . COVID-19 virus infection 01/20/2019  . ESOPHAGEAL STRICTURE 02/19/2005   Qualifier: Diagnosis of  By: Amil Amen MD, Benjamine Mola    . Goiter   . History of anemia    unclear cause, now resolved  . History of chicken pox   . History of colon polyps    benign  . HLD (hyperlipidemia)   . Hypothyroidism   . Malignant hypertension longstanding  . Microcytosis   . Osteopenia 12/2013   T -2.3 hip  .  Osteoporosis   . Rheumatoid arthritis ([place]) 11/2015   +RF, +CCP, ESR 49, synovitis on exam and [place] 11/2015 [facility])  . Right lower lobe pneumonia 07/24/2014  . Vitamin D deficiency     Past Surgical History:  Procedure Laterality Date  . CARDIAC CATHETERIZATION  2014   normal per patient  . COLONOSCOPY  11/2015   TA, int hem, rpt 5 yrs [facility])  . LEFT HEART CATHETERIZATION WITH CORONARY ANGIOGRAM N/A 05/01/2013   Procedure: LEFT HEART CATHETERIZATION WITH CORONARY ANGIOGRAM;  Surgeon: Leonie Man, MD;  Location: [facility] CATH LAB;  Service: Cardiovascular;  Laterality: N/A;  . PARTIAL HYSTERECTOMY  1970   fibroids     Inpatient Medications: Scheduled Meds: . amLODipine  10 mg Oral Daily  . aspirin EC  81 mg Oral Once  . [START ON 10/22/2020] calcium-vitamin D  1 tablet Oral Daily  . [START ON 10/22/2020] cholecalciferol  1,000 Units Oral Daily  . furosemide  20 mg Oral Daily  . heparin  5,000 Units Subcutaneous Q8H  . [START ON 10/22/2020] levothyroxine  125 mcg Oral Q0600  . metoprolol tartrate  37.5 mg Oral BID  . potassium chloride SA  20 mEq Oral Daily  . ramipril  10 mg Oral BID  . sodium chloride flush  3 mL Intravenous Q12H  . sodium chloride flush  3 mL Intravenous Q12H   Continuous Infusions: . sodium chloride     PRN Meds: sodium chloride, acetaminophen, hydrALAZINE, labetalol, morphine injection,  ondansetron (ZOFRAN) IV, sodium chloride flush  Allergies:    Allergies  Allergen Reactions  . Metronidazole Itching and Rash    Social History:   Social History   Socioeconomic History  . Marital status: Single    Spouse name: Not on file  . Number of children: 1  . Years of education: Not on file  . Highest education level: Not on file  Occupational History  . Occupation: Retired  Tobacco Use  . Smoking status: Former Smoker    Quit date: 05/16/1969    Years since quitting: 51.4  . Smokeless tobacco: Former Network engineer  . Vaping Use: Never used  Substance  and Sexual Activity  . Alcohol use: No    Alcohol/week: 0.0 standard drinks  . Drug use: No  . Sexual activity: Never  Other Topics Concern  . 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 Difficulty of Paying Living Expenses: Not hard at all  Food Insecurity: No Food Insecurity  . Worried About Charity fundraiser in the Last Year: Never true  . Ran Out of Food in the Last Year: Never true  Transportation Needs: No Transportation Needs  . Lack of Transportation (Medical): No  . Lack of Transportation (Non-Medical): No  Physical Activity: Inactive  . Days of Exercise per Week: 0 days  . Minutes of Exercise per Session: 0 min  Stress: No Stress Concern Present  . Feeling of Stress : Not at all  Social Connections: Not on file  Intimate Partner Violence: Not At Risk  . Fear of Current or Ex-Partner: No  . Emotionally Abused: No  . Physically Abused: No  . Sexually Abused: No    Family History:   The patient's family history includes Cancer in her maternal grandmother and mother; Diabetes in her sister; Heart disease in her maternal grandmother; Hypertension in her cousin and sister; Sudden death in her mother. There is no history of CAD or Stroke.  ROS:  Please see the history of present illness.  ROS  All other ROS reviewed and negative.     Physical Exam/Data:   Vitals:   10/21/20 1209 10/21/20 1222 10/21/20 1448 10/21/20 1456  BP: (!) 145/105 (!) 147/68 (!) 152/76 (!) 152/76  Pulse: 62 60 67 68  Resp:   18 16  Temp:    98.3 F (36.8 C)  TempSrc:      SpO2: 90% 93% 93% 94%  Weight:   68.3 kg   Height:   '5\' 4"'$  (1.626 m)     Intake/Output Summary (Last 24 hours) at 10/21/2020 1535 Last data filed at 10/21/2020 0813 Gross per 24 hour  Intake 200 ml  Output -  Net  200 ml   Filed Weights   10/21/20 0725 10/21/20 1448  Weight: 73 kg 68.3 kg   Body mass index is 25.85 kg/m.  General:  Well nourished, well developed, in no acute distress*** HEENT: normal Lymph: no adenopathy Neck: no JVD Endocrine:  No thryomegaly Vascular: No carotid bruits; FA pulses 2+ bilaterally without bruits  Cardiac:  normal S1, S2; RRR; no murmur *** Lungs:  clear to auscultation bilaterally, no wheezing, rhonchi or rales  Abd: soft, nontender, no hepatomegaly  Ext: no edema Musculoskeletal:  No deformities, BUE and  BLE strength normal and equal Skin: warm and dry  Neuro:  CNs 2-12 intact, no focal abnormalities noted Psych:  Normal affect   EKG:  The EKG was personally reviewed and demonstrates:  *** Telemetry:  Telemetry was personally reviewed and demonstrates:  ***  Relevant CV Studies:  02/19/20 TTE ECHOCARDIOGRAM REPORT   IMPRESSIONS  1. Left ventricular ejection fraction, by estimation, is 60 to 65%. The  left ventricle has normal function. The left ventricle has no regional  wall motion abnormalities. There is mild left ventricular hypertrophy.  Left ventricular diastolic parameters  are consistent with Grade III diastolic dysfunction (restrictive).  2. Right ventricular systolic function is mildly reduced. The right  ventricular size is normal. There is severely elevated pulmonary artery  systolic pressure. The estimated right ventricular systolic pressure is  78.2 mmHg.  3. Left atrial size was severely dilated.  4. Right atrial size was mildly dilated.  5. The aortic valve is tricuspid. Aortic valve regurgitation is not  visualized. Mild aortic valve sclerosis is present, with no evidence of  aortic valve stenosis.  6. The inferior vena cava is normal in size with <50% respiratory  variability, suggesting right atrial pressure of 8 mmHg.  7. The mitral valve is abnormal. Eccentric MR jet is poorly visualized on  current study. However  horizontal color Doppler splay is seen at the  mitral valve, which is often seen in severe MR. There is further evidence  of severe MR with marked LA  enlargement and very high E-wave velocity. Recommend TEE for further  evaluation of mitral regurgitation   FINDINGS  Left Ventricle: Left ventricular ejection fraction, by estimation, is 60  to 65%. The left ventricle has normal function. The left ventricle has no  regional wall motion abnormalities. The left ventricular internal cavity  size was normal in size. There is  mild left ventricular hypertrophy. Left ventricular diastolic parameters  are consistent with Grade III diastolic dysfunction (restrictive).   Right Ventricle: The right ventricular size is normal. Right vetricular  wall thickness was not assessed. Right ventricular systolic function is  mildly reduced. There is severely elevated pulmonary artery systolic  pressure. The tricuspid regurgitant  velocity is 4.19 m/s, and with an assumed right atrial pressure of 8 mmHg,  the estimated right ventricular systolic pressure is 78.2 mmHg.   Left Atrium: Left atrial size was severely dilated.   Right Atrium: Right atrial size was mildly dilated.   Pericardium: There is no evidence of pericardial effusion.   Mitral Valve: The mitral valve is abnormal. Moderate mitral annular  calcification. Moderate to severe mitral valve regurgitation. MV peak  gradient, 17.5 mmHg. The mean mitral valve gradient is 3.5 mmHg.   Tricuspid Valve: The tricuspid valve is normal in structure. Tricuspid  valve regurgitation is mild.   Aortic Valve: The aortic valve is tricuspid. Aortic valve regurgitation is  not visualized. Mild aortic valve sclerosis is present, with no evidence  of aortic valve stenosis.   Pulmonic Valve: The pulmonic valve was not well visualized. Pulmonic valve  regurgitation is trivial.   Aorta: The aortic root and ascending aorta are structurally normal, with  no  evidence of dilitation.   Venous: The inferior vena cava is normal in size with less than 50%  respiratory variability, suggesting right atrial pressure of 8 mmHg.   IAS/Shunts: The interatrial septum was not well visualized.     LEFT VENTRICLE  PLAX 2D  LVIDd:     5.10 cm Diastology  LVIDs:     3.20 cm LV e' lateral:  9.46 cm/s  LV PW:     1.20 cm LV E/e' lateral: 20.9  LV IVS:    0.70 cm LV e' medial:  6.74 cm/s  LVOT diam:   1.90 cm LV E/e' medial: 29.3  LV SV:     54  LV SV Index:  30  LVOT Area:   2.84 cm     RIGHT VENTRICLE  RV S prime:   7.62 cm/s  TAPSE (M-mode): 1.5 cm   LEFT ATRIUM       Index    RIGHT ATRIUM      Index  LA diam:    5.10 cm 2.82 cm/m  RA Area:   20.80 cm  LA Vol (A2C):  201.0 ml 111.16 ml/m RA Volume:  65.80 ml 36.39 ml/m  LA Vol (A4C):  116.0 ml 64.15 ml/m  LA Biplane Vol: 156.0 ml 86.28 ml/m  AORTIC VALVE  LVOT Vmax:  77.20 cm/s  LVOT Vmean: 52.925 cm/s  LVOT VTI:  0.190 m    AORTA  Ao Root diam: 2.90 cm  Ao Asc diam: 3.10 cm   MITRAL VALVE        TRICUSPID VALVE  MV Area (PHT) 2.93 cm   TR Peak grad:  70.2 mmHg  MV Peak grad: 17.5 mmHg  TR Vmax:    419.00 cm/s  MV Mean grad: 3.5 mmHg  MV Vmax:    2.09 m/s   SHUNTS  MV Vmean:   72.8 cm/s  Systemic VTI: 0.19 m  MV Decel Time: 191 msec   Systemic Diam: 1.90 cm  MR Peak grad:  127.2 mmHg  MR Mean grad:  82.0 mmHg  MR Vmax:    564.00 cm/s  MR Vmean:    426.0 cm/s  MR PISA:    1.57 cm  MR PISA Radius: 0.50 cm  MV E velocity: 197.50 cm/s  MV A velocity: 73.30 cm/s  MV E/A ratio: 2.69   Oswaldo Milian MD  Electronically signed by Oswaldo Milian MD  Signature Date/Time: 02/19/2020/10:49:18 AM    ________________________  TEE 10/21/20 IMPRESSIONS 1. Left ventricular ejection fraction, by estimation, is 55 to 60%. The  left ventricle has normal  function.  2. Right ventricular systolic function is moderately reduced. The right  ventricular size is normal.  3. Left atrial size was severely dilated. No left atrial/left atrial  appendage thrombus was detected.  4. The mitral valve is abnormal. Severe mitral valve regurgitation. There  is mild holosystolic prolapse of both leaflets of the mitral valve.  5. Tricuspid valve regurgitation is moderate.  6. The aortic valve is tricuspid. Aortic valve regurgitation is not  visualized.  7. There is mild (Grade II) plaque involving the descending aorta.   FINDINGS  Left Ventricle: Left ventricular ejection fraction, by estimation, is 55  to 60%. The left ventricle has normal function. The left ventricular  internal cavity size was normal in size.   Right Ventricle: The right ventricular size is normal. Right ventricular  systolic function is moderately reduced.   Left Atrium: Left atrial size was severely dilated. No left atrial/left  atrial appendage thrombus was detected.   Right Atrium: Right atrial size was normal in size.   Pericardium: There is no evidence of pericardial effusion.   Mitral Valve: The mitral valve is abnormal. There is mild holosystolic  prolapse of both leaflets of the mitral valve. Severe mitral valve  regurgitation.   Tricuspid Valve: The  tricuspid valve is grossly normal. Tricuspid valve  regurgitation is moderate.   Aortic Valve: The aortic valve is tricuspid. Aortic valve regurgitation is  not visualized.   Pulmonic Valve: The pulmonic valve was normal in structure. Pulmonic valve  regurgitation is not visualized.   Aorta: The aortic root is normal in size and structure. There is mild  (Grade II) plaque involving the descending aorta.   IAS/Shunts: No atrial level shunt detected by color flow Doppler.     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  She has normal LV function by 2D echo/TEE performed today.  Cardiac cath revealed normal coronary arteries.  She is symptomatic.  A right femoral angiogram was performed and a Mynx closure device was successfully deployed achieving hemostasis.  The right radial sheath was removed and a TR band was placed to achieve patent hemostasis.  The patient did receive 40 mg of IV furosemide at the end of the case.  She left the lab in stable condition.  She will start her Eliquis again tomorrow morning.  She has chronic A. fib.  I will see her back next week.  In the meantime, I have communicated these results to Dr. Roxy Manns , cardiothoracic surgery in anticipation of potential minimally invasive mitral valve repair.  Laboratory Data:  Chemistry Recent Labs  Lab 10/21/20 1024 10/21/20 1025 10/21/20 1038  NA 140 140 139  K 3.4* 3.4* 3.3*    No results for input(s): PROT, ALBUMIN, AST, ALT, ALKPHOS, BILITOT in the last 168 hours. Hematology Recent Labs  Lab 10/21/20 1025 10/21/20 1038 10/21/20 1454  WBC  --   --  5.6  RBC  --   --  4.48  HGB 12.2 11.9* 12.2  HCT 36.0 35.0* 36.3  MCV  --   --  81.0  MCH  --   --  27.2  MCHC  --   --  33.6  RDW  --   --  15.2  PLT  --   --  202   Cardiac EnzymesNo results for input(s): TROPONINI in the last 168 hours. No results for input(s): TROPIPOC in the last 168 hours.  BNPNo results for input(s): BNP, PROBNP in the last 168 hours.  DDimer No results for input(s): DDIMER in the last 168 hours.  Radiology/Studies:  CARDIAC CATHETERIZATION  Result Date: 10/21/2020  Hemodynamic findings consistent with mitral valve regurgitation.  Cesia Doxie Augenstein is a  75 y.o. female  [id number] LOCATION:  FACILITY: [facility] [place] PHYSICIAN: Quay Burow, M.D. 05-07-1946 DATE OF PROCEDURE:  10/21/2020 DATE OF DISCHARGE: CARDIAC CATHETERIZATION History obtained from chart review.Olamide Kihanna Kamiya is a 75 y.o.  moderately overweight African-American female who I initially met several years ago.I last saw her in the office 03/19/2020.Marland KitchenWe first [place] she was admitted to [facility] with diastolic heart failure, hypertension and atrial fibrillation. She ultimately underwent cardiac catheterization revealing essentially normal coronary arteries and normal LV function by Dr. Ellyn Hack. Her A. Fib converted to sinus rhythm with the aid  of IV Cardizem. Her blood pressure was better controlled. She saw Ellen Henri PA-C in the office and again was in atrial fibrillation at which time her beta blocker was adjusted and Eliquis was added because of increased CHA2DSVASC2 score of 2. Since I saw her in the office she remained clinically stable. She denies chest pain or shortness of breath.  She remains in A. fib on Eliquis oral anticoagulation. She did contract COVID-19 in late July2020and completed her quarantine a week ago. She had mild flulike symptoms. She did have a 2D echo performed 05/08/2015 that showed normal LV function with moderate mitral regurgitation and severe left atrial enlargement.  When I saw her 6 months ago she was complaining of only mild dyspnea.She did have a 2D echo performed 02/19/2020 that revealed normal LV size and function, mild LVH, grade 3 diastolic dysfunction, severe left atrial enlargement and severe mitral regurgitation.  Since I saw her 6 months ago she was doing well until several weeks ago when she noticed increasing lower extreme edema and progressive dyspnea on exertion.   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  She has normal LV function by 2D echo/TEE  performed today.  Cardiac cath revealed normal coronary arteries.  She is symptomatic.  A right femoral angiogram was performed and a Mynx closure device was successfully deployed achieving hemostasis.  The right radial sheath was removed and a TR band was placed to achieve patent hemostasis.  The patient did receive 40 mg of IV furosemide at the end of the case.  She left the lab in stable condition.  She will start her Eliquis again tomorrow morning.  She has chronic A. fib.  I will see her back next week.  In the meantime, I have communicated these results to Dr. Roxy Manns , cardiothoracic surgery in anticipation of potential minimally invasive mitral valve repair. Quay Burow. MD, [facility] 10/21/2020 10:59 AM   ECHO TEE  Result Date: 10/21/2020    TRANSESOPHOGEAL ECHO REPORT   Patient Name:   ELAIZA SHOBERG Pellman Date of Exam: 10/21/2020 Medical Rec #:  [id number]            Height:       64.0 in Accession #:    [id number]           Weight:       161.0 lb Date of Birth:  [birth year]            BSA:          1.784 m Patient Age:    56 years             BP:           122/57 mmHg Patient Gender: F                    HR:           61 bpm. Exam Location:  Inpatient Procedure: Transesophageal Echo, Color Doppler, Cardiac Doppler and 3D Echo Indications:     Mitral Regurgitation  History:         Patient has prior history of Echocardiogram examinations, most                  recent 02/19/2020. CHF; Risk Factors:Hypertension and                  Dyslipidemia.  Sonographer:     Mikki Santee RDCS (AE) Referring Phys:  [place] Phys: Kirk Ruths MD PROCEDURE: After discussion of the risks and benefits of a TEE, an informed consent was obtained. The transesophogeal probe was passed without difficulty through the esophogus of the patient. Sedation performed by different physician. The patient was monitored while under deep sedation. Anesthestetic sedation was provided intravenously by Anesthesiology:  215.89mg  of Propofol, 40mg  of Lidocaine. The patient developed no complications during the procedure. IMPRESSIONS  1. Left ventricular ejection fraction, by estimation, is 55 to 60%. The left ventricle has normal function.  2. Right ventricular systolic function is moderately reduced. The right ventricular size is normal.  3. Left atrial size was severely dilated. No left atrial/left atrial appendage thrombus was detected.  4. The mitral valve is abnormal. Severe mitral valve regurgitation. There is mild holosystolic prolapse of both leaflets of the mitral valve.  5. Tricuspid valve regurgitation is moderate.  6. The aortic valve is tricuspid. Aortic valve regurgitation is not visualized.  7. There is mild (Grade II) plaque involving the descending aorta. FINDINGS  Left Ventricle: Left ventricular ejection fraction, by estimation, is 55 to 60%. The left ventricle has normal function. The left ventricular internal cavity size was normal in size. Right Ventricle: The right ventricular size is normal. Right ventricular systolic function is moderately reduced. Left Atrium: Left atrial size was severely dilated. No left atrial/left atrial appendage thrombus was detected. Right Atrium: Right atrial size was normal in size. Pericardium: There is no evidence of pericardial effusion. Mitral Valve: The mitral valve is abnormal. There is mild holosystolic prolapse of both leaflets of the mitral valve. Severe mitral valve regurgitation. Tricuspid Valve: The tricuspid valve is grossly normal. Tricuspid valve regurgitation is moderate. Aortic Valve: The aortic valve is tricuspid. Aortic valve regurgitation is not visualized. Pulmonic Valve: The pulmonic valve was normal in structure. Pulmonic valve regurgitation is not visualized. Aorta: The aortic root is normal in size and structure. There is mild (Grade II) plaque involving the descending aorta. IAS/Shunts: No atrial level shunt detected by color flow Doppler.  MR Peak grad:     89.5 mmHg   TRICUSPID VALVE MR Mean grad:    60.0 mmHg   TR Peak grad:   46.2 mmHg MR Vmax:         473.00 cm/s TR Vmax:        340.00 cm/s MR Vmean:        369.0 cm/s MR PISA:         5.09 cm MR PISA Eff ROA: 38 mm MR PISA Radius:  0.90 cm Kirk Ruths MD Electronically signed by Kirk Ruths MD Signature Date/Time: 10/21/2020/2:19:01 PM    Final      STS Risk Calculator:  *** MVRepair/replacement.   Assessment and Plan:   Onetha Azile Minardi is a 75 y.o. female with symptoms of severe, stage *** mitral regurgitation with NYHA Class *** symptoms. I have reviewed the patient's recent echocardiogram which is notable for *** LV systolic function.   I have reviewed the natural history of mitral regurgitation with the patient. We have discussed the limitations of medical therapy and the poor prognosis associated with symptomatic mitral regurgitation. We have also reviewed potential treatment options, including palliative medical therapy, conventional surgical mitral valve repair or replacement, and percutaneous mitral valve repair with MitraClip. We discussed treatment options in the context of this patient's specific comorbid medical conditions.    The patient's predicted risk of mortality with conventional mitral valve replacement/repair is *** % / *** %  respectively,  primarily based on ***. Other significant comorbid conditions ***.       Signed, Angelena Form, PA-C  10/21/2020 3:35 PM

## 2020-10-21 NOTE — Transfer of Care (Signed)
Immediate Anesthesia Transfer of Care Note  Patient: Joanna Hood  Procedure(s) Performed: TRANSESOPHAGEAL ECHOCARDIOGRAM (TEE) (N/A )  Patient Location: Endoscopy Unit  Anesthesia Type:MAC  Level of Consciousness: drowsy  Airway & Oxygen Therapy: Patient Spontanous Breathing and Patient connected to nasal cannula oxygen  Post-op Assessment: Report given to RN and Post -op Vital signs reviewed and stable  Post vital signs: Reviewed and stable  Last Vitals:  Vitals Value Taken Time  BP    Temp    Pulse 46 10/21/20 0812  Resp 26 10/21/20 0812  SpO2 90 % 10/21/20 0812  Vitals shown include unvalidated device data.  Last Pain:  Vitals:   10/21/20 0725  TempSrc: Temporal  PainSc: 0-No pain         Complications: No complications documented.

## 2020-10-21 NOTE — Progress Notes (Signed)
Report called to charge RN for 3-E-29 and client transferred via stretcher

## 2020-10-22 ENCOUNTER — Observation Stay (HOSPITAL_BASED_OUTPATIENT_CLINIC_OR_DEPARTMENT_OTHER): Payer: Medicare HMO

## 2020-10-22 ENCOUNTER — Observation Stay (HOSPITAL_COMMUNITY): Payer: Medicare HMO

## 2020-10-22 ENCOUNTER — Encounter (HOSPITAL_COMMUNITY): Payer: Self-pay | Admitting: Cardiovascular Disease

## 2020-10-22 DIAGNOSIS — I051 Rheumatic mitral insufficiency: Secondary | ICD-10-CM | POA: Diagnosis not present

## 2020-10-22 DIAGNOSIS — I5033 Acute on chronic diastolic (congestive) heart failure: Secondary | ICD-10-CM | POA: Diagnosis not present

## 2020-10-22 DIAGNOSIS — I272 Pulmonary hypertension, unspecified: Secondary | ICD-10-CM | POA: Diagnosis not present

## 2020-10-22 DIAGNOSIS — I509 Heart failure, unspecified: Secondary | ICD-10-CM

## 2020-10-22 DIAGNOSIS — I4821 Permanent atrial fibrillation: Secondary | ICD-10-CM | POA: Diagnosis not present

## 2020-10-22 DIAGNOSIS — I5032 Chronic diastolic (congestive) heart failure: Secondary | ICD-10-CM | POA: Diagnosis present

## 2020-10-22 DIAGNOSIS — I071 Rheumatic tricuspid insufficiency: Secondary | ICD-10-CM | POA: Diagnosis present

## 2020-10-22 DIAGNOSIS — I480 Paroxysmal atrial fibrillation: Secondary | ICD-10-CM

## 2020-10-22 DIAGNOSIS — J811 Chronic pulmonary edema: Secondary | ICD-10-CM | POA: Diagnosis not present

## 2020-10-22 DIAGNOSIS — Z01810 Encounter for preprocedural cardiovascular examination: Secondary | ICD-10-CM | POA: Diagnosis not present

## 2020-10-22 DIAGNOSIS — I340 Nonrheumatic mitral (valve) insufficiency: Secondary | ICD-10-CM

## 2020-10-22 DIAGNOSIS — I517 Cardiomegaly: Secondary | ICD-10-CM | POA: Diagnosis not present

## 2020-10-22 DIAGNOSIS — Z01818 Encounter for other preprocedural examination: Secondary | ICD-10-CM | POA: Diagnosis not present

## 2020-10-22 LAB — BASIC METABOLIC PANEL
Anion gap: 9 (ref 5–15)
BUN: 9 mg/dL (ref 8–23)
CO2: 24 mmol/L (ref 22–32)
Calcium: 8.6 mg/dL — ABNORMAL LOW (ref 8.9–10.3)
Chloride: 103 mmol/L (ref 98–111)
Creatinine, Ser: 0.74 mg/dL (ref 0.44–1.00)
GFR, Estimated: >60 mL/min (ref >60)
Glucose, Bld: 91 mg/dL (ref 70–99)
Potassium: 3.2 mmol/L — ABNORMAL LOW (ref 3.5–5.1)
Sodium: 136 mmol/L (ref 135–145)

## 2020-10-22 LAB — CBC
HCT: 30.1 % — ABNORMAL LOW (ref 36.0–46.0)
HCT: 33.6 % — ABNORMAL LOW (ref 36.0–46.0)
Hemoglobin: 10 g/dL — ABNORMAL LOW (ref 12.0–15.0)
Hemoglobin: 11.2 g/dL — ABNORMAL LOW (ref 12.0–15.0)
MCH: 27 pg (ref 26.0–34.0)
MCH: 26.8 pg (ref 26.0–34.0)
MCHC: 33.2 g/dL (ref 30.0–36.0)
MCHC: 33.3 g/dL (ref 30.0–36.0)
MCV: 81.1 fL (ref 80.0–100.0)
MCV: 80.4 fL (ref 80.0–100.0)
Platelets: 166 10*3/uL (ref 150–400)
Platelets: 188 10*3/uL (ref 150–400)
RBC: 3.71 MIL/uL — ABNORMAL LOW (ref 3.87–5.11)
RBC: 4.18 MIL/uL (ref 3.87–5.11)
RDW: 14.9 % (ref 11.5–15.5)
RDW: 15 % (ref 11.5–15.5)
WBC: 5.8 10*3/uL (ref 4.0–10.5)
WBC: 7.1 10*3/uL (ref 4.0–10.5)
nRBC: 0 % (ref 0.0–0.2)
nRBC: 0.3 % — ABNORMAL HIGH (ref 0.0–0.2)

## 2020-10-22 MED ORDER — HEPARIN (PORCINE) 25000 UT/250ML-% IV SOLN
950.0000 [IU]/h | INTRAVENOUS | Status: AC
  Administered 2020-10-22 – 2020-10-23 (×2): 950 [IU]/h via INTRAVENOUS
  Filled 2020-10-22 (×2): qty 250

## 2020-10-22 NOTE — Progress Notes (Addendum)
Advanced Heart Failure Rounding Note  PCP-Cardiologist: Dr. Gwenlyn Found AHF: Dr. [name]   Patient Profile   [name], followed by Dr. Gwenlyn Found, w/ h/o chronic diastolic heart failure w/ predominant RV dysfunction + pulmonary hypertension, chronic atrial fibrillation on Eliquis, severe mitral regurgitation, systemic HTN and rheumatoid arthritis on Methotrexate, being evaluated for mitral valve repair vs replacement.   Subjective:    Diuresing w/ IV Lasix but remains fluid overloaded.   SCr stable. K low 3.2   CT surgery following.   Objective:   Weight Range: 67.7 kg Body mass index is 25.63 kg/m.   Vital Signs:   Temp:  [97.8 F (36.6 C)-98.6 F (37 C)] 98.4 F (36.9 C) (05/03 0910) Pulse Rate:  [55-74] 62 (05/03 0910) Resp:  [16-20] 20 (05/03 0406) BP: (122-152)/(57-76) 146/73 (05/03 0910) SpO2:  [90 %-100 %] 90 % (05/03 0910) Weight:  [67.7 kg-68.3 kg] 67.7 kg (05/03 0406) Last BM Date: 10/20/20  Weight change: Filed Weights   10/21/20 0725 10/21/20 1448 10/22/20 0406  Weight: 73 kg 68.3 kg 67.7 kg    Intake/Output:   Intake/Output Summary (Last 24 hours) at 10/22/2020 1249 Last data filed at 10/22/2020 0408 Gross per 24 hour  Intake 180 ml  Output 1400 ml  Net -1220 ml      Physical Exam    General:  Well appearing. No resp difficulty HEENT: Normal Neck: Supple. JVP elevated 8 cm . Carotids 2+ bilat; no bruits. No lymphadenopathy or thyromegaly appreciated. Cor: PMI nondisplaced. Irregularly irregular rhythm and rate. + 3/6 MR murmur at apex Lungs: Clear Abdomen: Soft, nontender, nondistended. No hepatosplenomegaly. No bruits or masses. Good bowel sounds. Extremities: No cyanosis, clubbing, rash, edema Neuro: Alert & orientedx3, cranial nerves grossly intact. moves all 4 extremities w/o difficulty. Affect pleasant   Telemetry   Chronic afib, HR 60s  EKG    No new EKG to review   Labs    CBC Recent Labs    10/21/20 1454 10/22/20 0345  WBC  5.6 5.8  HGB 12.2 10.0*  HCT 36.3 30.1*  MCV 81.0 81.1  PLT 202 270   Basic Metabolic Panel Recent Labs    10/21/20 1038 10/21/20 1454 10/22/20 0345  NA 139  --  136  K 3.3*  --  3.2*  CL  --   --  103  CO2  --   --  24  GLUCOSE  --   --  91  BUN  --   --  9  CREATININE  --  0.86 0.74  CALCIUM  --   --  8.6*   Liver Function Tests No results for input(s): AST, ALT, ALKPHOS, BILITOT, PROT, ALBUMIN in the last 72 hours. No results for input(s): LIPASE, AMYLASE in the last 72 hours. Cardiac Enzymes No results for input(s): CKTOTAL, CKMB, CKMBINDEX, TROPONINI in the last 72 hours.  BNP: BNP (last 3 results) No results for input(s): BNP in the last 8760 hours.  ProBNP (last 3 results) No results for input(s): PROBNP in the last 8760 hours.   D-Dimer No results for input(s): DDIMER in the last 72 hours. Hemoglobin A1C No results for input(s): HGBA1C in the last 72 hours. Fasting Lipid Panel No results for input(s): CHOL, HDL, LDLCALC, TRIG, CHOLHDL, LDLDIRECT in the last 72 hours. Thyroid Function Tests No results for input(s): TSH, T4TOTAL, T3FREE, THYROIDAB in the last 72 hours.  Invalid input(s): FREET3  Other results:   Imaging    DG Orthopantogram  Result  Date: 10/22/2020 CLINICAL DATA:  Preop study.  Poor dentition. EXAM: ORTHOPANTOGRAM/PANORAMIC COMPARISON:  No recent prior. FINDINGS: Absence of multiple teeth, particularly the lower teeth, noted bilaterally. Poor dentition with probable caries, particularly in the left upper incisors. No definite periapical lucency noted. IMPRESSION: Poor dentition as above.  No definite periapical lucency noted. Electronically Signed   By: Marcello Moores  Register   On: 10/22/2020 09:23   DG Chest 2 View  Result Date: 10/22/2020 CLINICAL DATA:  Preoperative study for heart surgery. EXAM: CHEST - 2 VIEW COMPARISON:  06/20/2020. FINDINGS: Stable severe cardiomegaly. Pulmonary venous congestion again noted. Mild bilateral interstitial  prominence is present. Mild bone CHF may be present. No pleural effusion or pneumothorax. Degenerative change thoracic spine. IMPRESSION: Stable severe cardiomegaly. Pulmonary venous congestion again noted. Mild bilateral interstitial prominence noted. A mild component of CHF may be present. Electronically Signed   By: Marcello Moores  Register   On: 10/22/2020 09:19      Medications:     Scheduled Medications: . amLODipine  10 mg Oral Daily  . aspirin EC  81 mg Oral Once  . calcium-vitamin D  1 tablet Oral Daily  . cholecalciferol  1,000 Units Oral Daily  . furosemide  40 mg Intravenous BID  . heparin  5,000 Units Subcutaneous Q8H  . levothyroxine  125 mcg Oral Q0600  . metoprolol tartrate  37.5 mg Oral BID  . potassium chloride SA  20 mEq Oral Daily  . ramipril  10 mg Oral BID  . sodium chloride flush  3 mL Intravenous Q12H  . sodium chloride flush  3 mL Intravenous Q12H     Infusions: . sodium chloride       PRN Medications:  sodium chloride, acetaminophen, morphine injection, ondansetron (ZOFRAN) IV, sodium chloride flush    Assessment/Plan   1. Severe Rheumatic Mitral Valve Regurgitation  - Primary MR. LVEF normal 60-65%  - TEE this admit confirmed severe MR. LHC w/ normal coronaries - Not candidate for MitraClip per Dr. Burt Knack  - CT surgery consulted for potential minimally invasive mitral valve repair - Dental Consult placed   2. Severe Pulmonary Hypertension  - TTE 8/21 w/ severely elevated RVSP, 78 mmHg. 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 WHO Group 1 2/2 CTD (RA)  - she will need repeat echo post MVR to reassess RVSP. If still elevated post valve repair, will need additional w/u to exclude other etiologies, including PFTs, V/Q scan, sleep study   3. Acute on Chronic Diastolic Heart  Failure w/ Predominant RV Dysfunction  - 2D Echo 8/21 showed normal LVEF, 60-65%, w/ mild LVH and G3DD c/w restrictive physiology. RV was mildly reduced at the time - TEE 10/21/20 LVEF normal. RV systolic function is now moderately reduced w/ mild-mod TR, in setting of progressive/ now severe MR. - RHC this admit; RAP 16, LVEDP 16. Cardiac output- 4.9 L/min with an index of 2.8 L/min/m. - Remains fluid overloaded. Continue IV Lasix for diuresis  - Continue ? blocker and home antihypertensives for BP/HR control   4. Chronic Afib: in setting of severe MR w/ severe LAE. - rate controlled w/ metoprolol  - on Eliquis for a/c, currently on hold until all invasive procedures/ surgeries completed   5. Rheumatoid Arthritis - on MTX - followed by rheumatology   6. Hyponatremia -  K 3.2  - supp w/ KCl   7. Anemia - Hgb down from baseline of 12>>10 - s/p LHC via Rt femoral artery this admit. BP stable - repeat stat CBC    Length of Stay: 0  Lyda Jester, PA-C  10/22/2020, 12:49 PM  Advanced Heart Failure Team Pager [phone] (M-F; 7a - 5p)  Please contact [facility] for night-coverage after hours (5p -7a ) and weekends on [email or web address]  Patient seen and examined with the above-signed Advanced Practice Provider and/or Housestaff. I personally reviewed laboratory data, imaging studies and relevant notes. I independently examined the patient and formulated the important aspects of the plan. I have edited the note to reflect any of my changes or salient points. I have personally discussed the plan with the patient and/or family.  Volume status improving with IV lasix. Breathing better. No orthopnea or PND. Creatinine stable. Hgb down slightly.   General:  Sitting up in bed No resp difficulty HEENT: normal Neck: supple. JBP 7-8  Carotids 2+ bilat; no bruits. No lymphadenopathy or thryomegaly appreciated. Cor: PMI nondisplaced. Irregular rate & rhythm. 2/6 MR Lungs: clear Abdomen: soft,  nontender, nondistended. No hepatosplenomegaly. No bruits or masses. Good bowel sounds. Extremities: no cyanosis, clubbing, rash, edema Neuro: alert & orientedx3, cranial nerves grossly intact. moves all 4 extremities w/o difficulty. Affect pleasant  Improving with IV diuresis. I discussed case with Structural Heart team at length this am. Dr. Roxy Manns has seen as well. MV likely rheumatic and thus not candidate for MitraClip. She has been offered high-risk MV repair/replacement. She is willing to proceed if candidate. Will need teeth extracted first. Would consider repeat RHC with possible milrinone support pre-op once surgical plans solidified.  Start heparin for AF once Hgb stable. Will need CR to keep active while in house. Glori Bickers, MD  1:11 PM

## 2020-10-22 NOTE — Progress Notes (Signed)
Pre cabg has been completed.   Preliminary results in CV Proc.   Abram Sander 10/22/2020 2:12 PM

## 2020-10-22 NOTE — Consult Note (Addendum)
[address]       [place] [address]             [phone]          CARDIOTHORACIC SURGERY CONSULTATION REPORT  PCP is Ria Bush, MD Referring Provider is Quay Burow, MD  Reason for consultation:  Severe mitral regurgitation  HPI:  Patient is a 75 year old African-American female with history of mitral regurgitation, longstanding persistent atrial fibrillation on long-term anticoagulation using Eliquis, hypertension, chronic diastolic congestive heart failure, pulmonary hypertension, rheumatoid arthritis on long-term methotrexate and chronic anemia who has been referred for surgical consultation to discuss treatment options for management of severe mitral regurgitation.  Patient states that she has been told that she had a heart murmur since she was very young.  She does not recall ever being told that she had rheumatic fever or rheumatic heart disease.  She has a long history of exertional shortness of breath and originally presented with acute exacerbation of chronic diastolic congestive heart failure in the setting of severe hypertension with new onset atrial fibrillation in 2014.  She has been followed intermittently ever since by Dr. Gwenlyn Found.    The patient states that symptoms of shortness of breath have progressed considerably over the past year or 2.  Follow-up echocardiogram performed February 19, 2020 revealed normal left ventricular size and systolic function with severe left atrial enlargement and severe mitral regurgitation.  She was seen in follow-up several weeks ago at which time she complained that she got short of breath walking very short distances.  She also had significant lower extremity edema.  Her dose of Lasix was increased at that time and she was scheduled for elective transesophageal echocardiogram and diagnostic cardiac catheterization.  TEE confirmed the presence of severe mitral regurgitation with severe left atrial enlargement.   Left ventricular ejection fraction was reported 55 to 60%.  There was evidence of moderately reduced right ventricular function and moderate tricuspid regurgitation.  Left and right heart catheterization revealed normal coronary anatomy with no significant coronary artery disease.  There was severe pulmonary hypertension (PA pressures 80/32 mmHg) with preserved cardiac output.  Mean right atrial pressure was 16 mmHg.  Cardiothoracic surgical consultation was requested.  Patient lives alone locally in [place].  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  She takes care of several of her great grandchildren on a daily basis.  She reports that she is limited only by longstanding symptoms of exertional shortness of breath which has been considerably worse recently.  Her mobility is otherwise good and she has minimal pain related to her history of arthritis.  At present she denies resting shortness of breath, PND, or orthopnea, although she has had all of the symptoms recently in the past.  Lower extremity edema has improved.  She does not experience any chest pain or chest tightness.  She denies any history of palpitations, dizzy spells, or syncope.    Past Medical History:  Diagnosis Date  . Atrial fibrillation with RVR ([place]) 2014   persistent  . Chronic diastolic CHF (congestive heart failure) (HCC)    HTN  . COVID-19 virus infection 01/20/2019  . ESOPHAGEAL STRICTURE 02/19/2005   Qualifier: Diagnosis of  By: Amil Amen MD, Benjamine Mola    . Goiter   . History of anemia    unclear cause, now resolved  . History of chicken pox   . History of colon polyps    benign  .  HLD (hyperlipidemia)   . Hypothyroidism   . Malignant hypertension longstanding  . Microcytosis   . Osteopenia 12/2013   T -2.3 hip  . Osteoporosis   . Rheumatoid arthritis ([place]) 11/2015   +RF, +CCP, ESR 49, synovitis on exam and [place] 11/2015 [facility])  . Right lower lobe pneumonia 07/24/2014  .  Vitamin D deficiency     Past Surgical History:  Procedure Laterality Date  . CARDIAC CATHETERIZATION  2014   normal per patient  . COLONOSCOPY  11/2015   TA, int hem, rpt 5 yrs [facility])  . LEFT HEART CATHETERIZATION WITH CORONARY ANGIOGRAM N/A 05/01/2013   Procedure: LEFT HEART CATHETERIZATION WITH CORONARY ANGIOGRAM;  Surgeon: Leonie Man, MD;  Location: [facility] CATH LAB;  Service: Cardiovascular;  Laterality: N/A;  . PARTIAL HYSTERECTOMY  1970   fibroids    Family History  Problem Relation Age of Onset  . Cancer Mother        ovarian  . Sudden death Mother   . Diabetes Sister   . Cancer Maternal Grandmother        lung  . Heart disease Maternal Grandmother   . Hypertension Sister   . Hypertension Cousin   . CAD Neg Hx   . Stroke Neg Hx     Social History   Socioeconomic History  . Marital status: Single    Spouse name: Not on file  . Number of children: 1  . Years of education: Not on file  . Highest education level: Not on file  Occupational History  . Occupation: Retired  Tobacco Use  . Smoking status: Former Smoker    Quit date: 05/16/1969    Years since quitting: 51.4  . Smokeless tobacco: Former Network engineer  . Vaping Use: Never used  Substance and Sexual Activity  . Alcohol use: No    Alcohol/week: 0.0 standard drinks  . Drug use: No  . Sexual activity: Never  Other Topics Concern  . 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 Difficulty of Paying Living Expenses: Not hard at all  Food Insecurity: No Food Insecurity  . Worried About Charity fundraiser in the Last Year: Never true  . Ran Out of Food in the Last Year: Never true  Transportation Needs: No Transportation Needs  . Lack of Transportation (Medical): No  . Lack of Transportation  (Non-Medical): No  Physical Activity: Inactive  . Days of Exercise per Week: 0 days  . Minutes of Exercise per Session: 0 min  Stress: No Stress Concern Present  . Feeling of Stress : Not at all  Social Connections: Not on file  Intimate Partner Violence: Not At Risk  . Fear of Current or Ex-Partner: No  . Emotionally Abused: No  . Physically Abused: No  . Sexually Abused: No    Prior to Admission medications   Medication Sig Start Date End Date Taking? Authorizing Provider  acetaminophen (TYLENOL) 325 MG tablet Take 650 mg by mouth every 6 (six) hours as needed (pain).   Yes [provider]  amLODipine (NORVASC) 10 MG tablet Take 1 tablet (10 mg total) by mouth daily. Patient taking differently: Take 10 mg by mouth in the morning. 09/17/20  Yes Ria Bush, MD  aspirin EC 81 MG  tablet Take 81 mg by mouth once. Swallow whole. 10/21/20  Yes [provider]  Calcium Carb-Cholecalciferol (CALCIUM-VITAMIN D) 600-400 MG-UNIT TABS Take 1 tablet by mouth in the morning.   Yes [provider]  cholecalciferol (VITAMIN D) 1000 UNITS tablet Take 1,000 Units by mouth in the morning.   Yes [provider]  ELIQUIS 5 MG TABS tablet TAKE 1 TABLET TWICE DAILY Patient taking differently: Take 5 mg by mouth 2 (two) times daily. 07/16/20  Yes Lorretta Harp, MD  furosemide (LASIX) 20 MG tablet TAKE 1 TABLET EVERY DAY Patient taking differently: Take 40 mg by mouth in the morning. 06/13/20  Yes Lorretta Harp, MD  levothyroxine (SYNTHROID) 125 MCG tablet Take 1 tablet (125 mcg total) by mouth daily before breakfast. 09/17/20  Yes Ria Bush, MD  methotrexate (RHEUMATREX) 2.5 MG tablet Take 15 mg by mouth every Friday. Caution:Chemotherapy. Protect from light.   Yes [provider]  metoprolol tartrate 37.5 MG TABS Take 37.5 mg by mouth 2 (two) times daily. 09/17/20  Yes Ria Bush, MD  potassium chloride SA (KLOR-CON) 20 MEQ tablet TAKE 1  TABLET EVERY DAY Patient taking differently: Take 20 mEq by mouth in the morning. 06/10/20  Yes Lorretta Harp, MD  ramipril (ALTACE) 10 MG capsule TAKE 1 CAPSULE TWICE DAILY Patient taking differently: Take 10 mg by mouth 2 (two) times daily. 09/26/18  Yes Lorretta Harp, MD  fluticasone (FLONASE) 50 MCG/ACT nasal spray Place 2 sprays into both nostrils daily. Patient taking differently: Place 2 sprays into both nostrils daily as needed for allergies. 06/20/20   Melynda Ripple, MD    Current Facility-Administered Medications  Medication Dose Route Frequency Provider Last Rate Last Admin  . 0.9 %  sodium chloride infusion  250 mL Intravenous PRN Lorretta Harp, MD      . acetaminophen (TYLENOL) tablet 650 mg  650 mg Oral Q6H PRN Lorretta Harp, MD      . amLODipine (NORVASC) tablet 10 mg  10 mg Oral Daily Lorretta Harp, MD      . aspirin EC tablet 81 mg  81 mg Oral Once Lorretta Harp, MD      . calcium-vitamin D (OSCAL WITH D) 500-200 MG-UNIT per tablet 1 tablet  1 tablet Oral Daily Lorretta Harp, MD      . cholecalciferol (VITAMIN D3) tablet 1,000 Units  1,000 Units Oral Daily Lorretta Harp, MD      . furosemide (LASIX) injection 40 mg  40 mg Intravenous BID Lyda Jester M, PA-C      . heparin injection 5,000 Units  5,000 Units Subcutaneous Q8H Kathyrn Drown D, NP   5,000 Units at 10/21/20 2136  . levothyroxine (SYNTHROID) tablet 125 mcg  125 mcg Oral Q0600 Lorretta Harp, MD      . metoprolol tartrate (LOPRESSOR) tablet 37.5 mg  37.5 mg Oral BID Lorretta Harp, MD   37.5 mg at 10/21/20 2136  . morphine 2 MG/ML injection 2 mg  2 mg Intravenous Q1H PRN Lorretta Harp, MD      . ondansetron [facility]) injection 4 mg  4 mg Intravenous Q6H PRN Lorretta Harp, MD      . potassium chloride SA (KLOR-CON) CR tablet 20 mEq  20 mEq Oral Daily Lorretta Harp, MD   20 mEq at 10/21/20 1715  . ramipril (ALTACE) capsule 10 mg  10 mg Oral BID Lorretta Harp, MD   10  mg at 10/21/20 2137  . sodium chloride flush (NS) 0.9 % injection 3 mL  3 mL Intravenous Q12H Lorretta Harp, MD   3 mL at 10/21/20 2137  . sodium chloride flush (NS) 0.9 % injection 3 mL  3 mL Intravenous Q12H Lorretta Harp, MD   3 mL at 10/21/20 2137  . sodium chloride flush (NS) 0.9 % injection 3 mL  3 mL Intravenous PRN Lorretta Harp, MD        Allergies  Allergen Reactions  . Metronidazole Itching and Rash      Review of Systems:   General:  marginal appetite, decreased energy, + weight gain, + weight loss, no fever  Cardiac:  no chest pain with exertion, no chest pain at rest, +SOB with exertion, no resting SOB, + PND, + orthopnea, no palpitations, + arrhythmia, + atrial fibrillation, + LE edema, no dizzy spells, no syncope  Respiratory:  + exertional shortness of breath, no home oxygen, no productive cough, no dry cough, no bronchitis, no wheezing, no hemoptysis, no asthma, no pain with inspiration or cough, no sleep apnea, no CPAP at night  GI:   some difficulty swallowing, + reflux, no frequent heartburn, no hiatal hernia, no abdominal pain, no constipation, no diarrhea, no hematochezia, no hematemesis, no melena  GU:   no dysuria,  no frequency, no urinary tract infection, no hematuria, no kidney stones, no kidney disease  Vascular:  no pain suggestive of claudication, no pain in feet, no leg cramps, no varicose veins, no DVT, no non-healing foot ulcer  Neuro:   no stroke, no TIA's, no seizures, no headaches, no temporary blindness one eye,  no slurred speech, no peripheral neuropathy, no chronic pain, no instability of gait, no memory/cognitive dysfunction  Musculoskeletal: + arthritis, no joint swelling, no myalgias, no difficulty walking, normal mobility   Skin:   no rash, no itching, no skin infections, no pressure sores or ulcerations  Psych:   no anxiety, no depression, no nervousness, no unusual recent stress  Eyes:   no blurry vision, no floaters, no  recent vision changes, + wears glasses or contacts  ENT:   no hearing loss, + loose or painful teeth, no dentures, last saw dentist many years ago  Hematologic:  + easy bruising, no abnormal bleeding, no clotting disorder, no frequent epistaxis  Endocrine:  no diabetes, does not check CBG's at home     Physical Exam:   BP 131/65 (BP Location: Left Arm)   Pulse (!) 58   Temp 98.5 F (36.9 C) (Oral)   Resp 20   Ht 5' 4"  (1.626 m)   Wt 67.7 kg Comment: scale b  SpO2 100%   BMI 25.63 kg/m   General:  AA female NAD,  well-appearing  HEENT:  Unremarkable   Neck:   no JVD, no bruits, no adenopathy   Chest:   clear to auscultation, symmetrical breath sounds, no wheezes, no rhonchi   CV:   Irregular rate and rhythm, grade III/VI holosystolic murmur   Abdomen:  soft, non-tender, no masses   Extremities:  warm, well-perfused, pulses diminished, no lower extremity edema  Rectal/GU  Deferred  Neuro:   Grossly non-focal and symmetrical throughout  Skin:   Clean and dry, no rashes, no breakdown  Diagnostic Tests:  Lab Results: Recent Labs    10/21/20 1454 10/22/20 0345  WBC 5.6 5.8  HGB 12.2 10.0*  HCT 36.3 30.1*  PLT 202 166   BMET:  Recent Labs  10/21/20 1038 10/21/20 1454 10/22/20 0345  NA 139  --  136  K 3.3*  --  3.2*  CL  --   --  103  CO2  --   --  24  GLUCOSE  --   --  91  BUN  --   --  9  CREATININE  --  0.86 0.74  CALCIUM  --   --  8.6*    CBG (last 3)  No results for input(s): GLUCAP in the last 72 hours. PT/INR:  No results for input(s): LABPROT, INR in the last 72 hours.  CXR:  N/A    ECHOCARDIOGRAM REPORT       Patient Name:  TAESHA GOODELL Date of Exam: 02/19/2020  Medical Rec #: [id number]      Height:    62.5 in  Accession #:  [id number]      Weight:    173.0 lb  Date of Birth: January 31, 1946      BSA:     1.808 m  Patient Age:  62 years       BP:      140/84 mmHg  Patient Gender: F           HR:      65 bpm.  Exam Location: [address]   Procedure: 2D Echo, Color Doppler and Cardiac Doppler   Indications:  I05.9 Mitral Valve Disorder         I34.0 Mitral Valve Regurgitation    History:    Patient has prior history of Echocardiogram examinations,  most         recent 02/14/2019. CHF, Arrythmias:Atrial Fibrillation,         Signs/Symptoms:Shortness of Breath and Murmur; Risk         Factors:Hypertension, Family History of Coronary Artery  Disease,         Dyslipidemia and Former Smoker. COVID (July 2020) no  Residual         Symptoms.    Sonographer:  Deliah Boston RDCS  Referring Phys: [place]    1. Left ventricular ejection fraction, by estimation, is 60 to 65%. The  left ventricle has normal function. The left ventricle has no regional  wall motion abnormalities. There is mild left ventricular hypertrophy.  Left ventricular diastolic parameters  are consistent with Grade III diastolic dysfunction (restrictive).  2. Right ventricular systolic function is mildly reduced. The right  ventricular size is normal. There is severely elevated pulmonary artery  systolic pressure. The estimated right ventricular systolic pressure is  41.7 mmHg.  3. Left atrial size was severely dilated.  4. Right atrial size was mildly dilated.  5. The aortic valve is tricuspid. Aortic valve regurgitation is not  visualized. Mild aortic valve sclerosis is present, with no evidence of  aortic valve stenosis.  6. The inferior vena cava is normal in size with <50% respiratory  variability, suggesting right atrial pressure of 8 mmHg.  7. The mitral valve is abnormal. Eccentric MR jet is poorly visualized on  current study. However horizontal color Doppler splay is seen at the  mitral valve, which is often seen in severe MR. There is further evidence  of severe MR with marked  LA  enlargement and very high E-wave velocity. Recommend TEE for further  evaluation of mitral regurgitation   FINDINGS  Left Ventricle: Left ventricular ejection fraction, by estimation, is 60  to 65%. The left ventricle has normal function. The left ventricle has  no  regional wall motion abnormalities. The left ventricular internal cavity  size was normal in size. There is  mild left ventricular hypertrophy. Left ventricular diastolic parameters  are consistent with Grade III diastolic dysfunction (restrictive).   Right Ventricle: The right ventricular size is normal. Right vetricular  wall thickness was not assessed. Right ventricular systolic function is  mildly reduced. There is severely elevated pulmonary artery systolic  pressure. The tricuspid regurgitant  velocity is 4.19 m/s, and with an assumed right atrial pressure of 8 mmHg,  the estimated right ventricular systolic pressure is 96.7 mmHg.   Left Atrium: Left atrial size was severely dilated.   Right Atrium: Right atrial size was mildly dilated.   Pericardium: There is no evidence of pericardial effusion.   Mitral Valve: The mitral valve is abnormal. Moderate mitral annular  calcification. Moderate to severe mitral valve regurgitation. MV peak  gradient, 17.5 mmHg. The mean mitral valve gradient is 3.5 mmHg.   Tricuspid Valve: The tricuspid valve is normal in structure. Tricuspid  valve regurgitation is mild.   Aortic Valve: The aortic valve is tricuspid. Aortic valve regurgitation is  not visualized. Mild aortic valve sclerosis is present, with no evidence  of aortic valve stenosis.   Pulmonic Valve: The pulmonic valve was not well visualized. Pulmonic valve  regurgitation is trivial.   Aorta: The aortic root and ascending aorta are structurally normal, with  no evidence of dilitation.   Venous: The inferior vena cava is normal in size with less than 50%  respiratory variability, suggesting right atrial  pressure of 8 mmHg.   IAS/Shunts: The interatrial septum was not well visualized.     LEFT VENTRICLE  PLAX 2D  LVIDd:     5.10 cm Diastology  LVIDs:     3.20 cm LV e' lateral:  9.46 cm/s  LV PW:     1.20 cm LV E/e' lateral: 20.9  LV IVS:    0.70 cm LV e' medial:  6.74 cm/s  LVOT diam:   1.90 cm LV E/e' medial: 29.3  LV SV:     54  LV SV Index:  30  LVOT Area:   2.84 cm     RIGHT VENTRICLE  RV S prime:   7.62 cm/s  TAPSE (M-mode): 1.5 cm   LEFT ATRIUM       Index    RIGHT ATRIUM      Index  LA diam:    5.10 cm 2.82 cm/m  RA Area:   20.80 cm  LA Vol (A2C):  201.0 ml 111.16 ml/m RA Volume:  65.80 ml 36.39 ml/m  LA Vol (A4C):  116.0 ml 64.15 ml/m  LA Biplane Vol: 156.0 ml 86.28 ml/m  AORTIC VALVE  LVOT Vmax:  77.20 cm/s  LVOT Vmean: 52.925 cm/s  LVOT VTI:  0.190 m    AORTA  Ao Root diam: 2.90 cm  Ao Asc diam: 3.10 cm   MITRAL VALVE        TRICUSPID VALVE  MV Area (PHT) 2.93 cm   TR Peak grad:  70.2 mmHg  MV Peak grad: 17.5 mmHg  TR Vmax:    419.00 cm/s  MV Mean grad: 3.5 mmHg  MV Vmax:    2.09 m/s   SHUNTS  MV Vmean:   72.8 cm/s  Systemic VTI: 0.19 m  MV Decel Time: 191 msec   Systemic Diam: 1.90 cm  MR Peak grad:  127.2 mmHg  MR Mean grad:  82.0 mmHg  MR Vmax:  564.00 cm/s  MR Vmean:    426.0 cm/s  MR PISA:    1.57 cm  MR PISA Radius: 0.50 cm  MV E velocity: 197.50 cm/s  MV A velocity: 73.30 cm/s  MV E/A ratio: 2.69   Oswaldo Milian MD  Electronically signed by Oswaldo Milian MD  Signature Date/Time: 02/19/2020/10:49:18 AM       TRANSESOPHOGEAL ECHO REPORT       Patient Name:  Crista Curb Date of Exam: 10/21/2020  Medical Rec #: [id number]      Height:    64.0 in  Accession #:  [id number]      Weight:    161.0 lb  Date of Birth: 10/02/1945      BSA:     1.784 m  Patient  Age:  22 years       BP:      122/57 mmHg  Patient Gender: F          HR:      61 bpm.  Exam Location: Inpatient   Procedure: Transesophageal Echo, Color Doppler, Cardiac Doppler and 3D  Echo   Indications:   Mitral Regurgitation    History:     Patient has prior history of Echocardiogram examinations,  most          recent 02/19/2020. CHF; Risk Factors:Hypertension and          Dyslipidemia.    Sonographer:   Mikki Santee RDCS (AE)  Referring Phys: [place]  Diagnosing Phys: Kirk Ruths MD   PROCEDURE: After discussion of the risks and benefits of a TEE, an  informed consent was obtained. The transesophogeal probe was passed  without difficulty through the esophogus of the patient. Sedation  performed by different physician. The patient was  monitored while under deep sedation. Anesthestetic sedation was provided  intravenously by Anesthesiology: 215.63m of Propofol, 472mof Lidocaine.  The patient developed no complications during the procedure.   IMPRESSIONS    1. Left ventricular ejection fraction, by estimation, is 55 to 60%. The  left ventricle has normal function.  2. Right ventricular systolic function is moderately reduced. The right  ventricular size is normal.  3. Left atrial size was severely dilated. No left atrial/left atrial  appendage thrombus was detected.  4. The mitral valve is abnormal. Severe mitral valve regurgitation. There  is mild holosystolic prolapse of both leaflets of the mitral valve.  5. Tricuspid valve regurgitation is moderate.  6. The aortic valve is tricuspid. Aortic valve regurgitation is not  visualized.  7. There is mild (Grade II) plaque involving the descending aorta.   FINDINGS  Left Ventricle: Left ventricular ejection fraction, by estimation, is 55  to 60%. The left ventricle has normal function. The left ventricular  internal cavity size was  normal in size.   Right Ventricle: The right ventricular size is normal. Right ventricular  systolic function is moderately reduced.   Left Atrium: Left atrial size was severely dilated. No left atrial/left  atrial appendage thrombus was detected.   Right Atrium: Right atrial size was normal in size.   Pericardium: There is no evidence of pericardial effusion.   Mitral Valve: The mitral valve is abnormal. There is mild holosystolic  prolapse of both leaflets of the mitral valve. Severe mitral valve  regurgitation.   Tricuspid Valve: The tricuspid valve is grossly normal. Tricuspid valve  regurgitation is moderate.   Aortic Valve: The aortic valve is tricuspid. Aortic valve regurgitation is  not visualized.  Pulmonic Valve: The pulmonic valve was normal in structure. Pulmonic valve  regurgitation is not visualized.   Aorta: The aortic root is normal in size and structure. There is mild  (Grade II) plaque involving the descending aorta.   IAS/Shunts: No atrial level shunt detected by color flow Doppler.     MR Peak grad:  89.5 mmHg  TRICUSPID VALVE  MR Mean grad:  60.0 mmHg  TR Peak grad:  46.2 mmHg  MR Vmax:     473.00 cm/s TR Vmax:    340.00 cm/s  MR Vmean:    369.0 cm/s  MR PISA:     5.09 cm  MR PISA Eff ROA: 38 mm  MR PISA Radius: 0.90 cm   Kirk Ruths MD  Electronically signed by Kirk Ruths MD  Signature Date/Time: 10/21/2020/2:19:01 PM     RIGHT/LEFT HEART CATH AND CORONARY ANGIOGRAPHY    Conclusion    Hemodynamic findings consistent with mitral valve regurgitation.   Rehanna Eliyanna Ault is a 75 y.o. female    [id number] LOCATION:  FACILITY: [facility]  PHYSICIAN: Quay Burow, M.D. [birth year]   DATE OF PROCEDURE:  10/21/2020  DATE OF DISCHARGE:     CARDIAC CATHETERIZATION     History obtained from chart review.Amada Laverne Turneris a 75 y.o.moderately overweight African-American female who I  initially met several years ago.I last saw her in the office9/28/2021.Marland KitchenWe first [place] she was admitted to [facility] with diastolic heart failure, hypertension and atrial fibrillation. She ultimately underwent cardiac catheterization revealing essentially normal coronary arteries and normal LV function by Dr. Ellyn Hack. Her A. Fib converted to sinus rhythm with the aid of IV Cardizem. Her blood pressure was better controlled. She saw Ellen Henri PA-C in the office and again was in atrial fibrillation at which time her beta blocker was adjusted and Eliquis was added because of increased CHA2DSVASC2 score of 2. Since I saw her in the office she remained clinically stable. She denies chest pain or shortness of breath.  She remains in A. fib on Eliquis oral anticoagulation. She did contract COVID-19 in late July2020and completed her quarantine a week ago. She had mild flulike symptoms. She did have a 2D echo performed 05/08/2015 that showed normal LV function with moderate mitral regurgitation and severe left atrial enlargement.  When I saw her 6 months ago she was complaining of only mild dyspnea.She did have a 2D echo performed 02/19/2020 that revealed normal LV size and function, mild LVH, grade 3 diastolic dysfunction, severe left atrial enlargement and severe mitral regurgitation.  Since I saw her 6 months ago she was doing well until several weeks ago when she noticed increasing lower extreme edema and progressive dyspnea on exertion.   IMPRESSION: Ms. 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  She has normal LV function by 2D echo/TEE performed today.  Cardiac cath revealed normal coronary arteries.  She is symptomatic.  A right femoral angiogram was performed and a Mynx closure device was successfully deployed achieving hemostasis.  The right radial sheath was removed and a TR band was placed to achieve  patent hemostasis.  The patient did receive 40 mg of IV furosemide at the end of the case.  She left the lab in stable condition.  She will start her Eliquis again tomorrow morning.  She has chronic A. fib.  I will see her back next week.  In the meantime, I have communicated these results to Dr. Roxy Manns , cardiothoracic  surgery in anticipation of potential minimally invasive mitral valve repair.  Quay Burow. MD, [facility] 10/21/2020 10:59 AM      Indications  Severe mitral regurgitation [I34.0 (ICD-10-CM)]  Pulmonary hypertension, unspecified ([place]) [I27.20 (ICD-10-CM)]   Procedural Details  Technical Details PROCEDURE DESCRIPTION:   The patient was brought to the second floor [place] cath lab in the postabsorptive state.  She was not premedicated .  Her right antecubital fossawas prepped and shaved in usual sterile fashion. Xylocaine 1% was used for local anesthesia. A 6 French sheath was inserted into the right radial  artery using standard Seldinger technique. The patient received 3500 units  of heparin intravenously.  There was a significant amount of radial artery spasm which would not allow easy passage of the diagnostic catheters and therefore the radial approach was abandoned.  A 5 French sheath was inserted into the right antecubital vein.  A 5 French balloontipped Swan-Ganz catheter was advanced to the right heart chambers obtaining sequential pressures and pulmonary blood samples for the determination of Fick cardiac output.  Ultrasound was used to identify the right common femoral artery and guide access.  A digital image was captured and placed in the patient's chart. 5 French right left Judkins diagnostic catheters on with a 5 French pigtail catheter used for selective coronary angiography and obtain left heart pressures.  Isovue dye is used for the entirety of the case.  Retroaortic, ventricular and pullback pressures were recorded.  Radial cocktail was administered via  the SideArm sheath. Estimated blood loss <50 mL.   During this procedure no sedation was administered.   Medications (Filter: Administrations occurring from 0945 to 1110 on 10/21/20) (important) Continuous medications are totaled by the amount administered until 10/21/20 1110.    Heparin (Porcine) in NaCl 1000-0.9 UT/500ML-% SOLN (mL) Total volume:  1,000 mL  Date/Time Rate/Dose/Volume Action   10/21/20 0958 500 mL Given   0958 500 mL Given    lidocaine (PF) (XYLOCAINE) 1 % injection (mL) Total volume:  24 mL  Date/Time Rate/Dose/Volume Action   10/21/20 1013 2 mL Given   1021 2 mL Given   1029 20 mL Given    Radial Cocktail (Verapamil 5 mg, NTG, Lidocaine) (mL) Total volume:  10 mL  Date/Time Rate/Dose/Volume Action   10/21/20 1025 5 mL Given   1055 5 mL Given    iohexol (OMNIPAQUE) 350 MG/ML injection (mL) Total volume:  45 mL  Date/Time Rate/Dose/Volume Action   10/21/20 1050 45 mL Given    furosemide (LASIX) injection (mg) Total dose:  40 mg  Date/Time Rate/Dose/Volume Action   10/21/20 1050 40 mg Given    amLODipine (NORVASC) tablet 10 mg (mg) Total dose:  0 mg*  *Administration not included in total Date/Time Rate/Dose/Volume Action   10/21/20 1056 *10 mg Not Given    aspirin EC tablet 81 mg (mg) Total dose:  0 mg* Dosing weight:  73  *Administration not included in total Date/Time Rate/Dose/Volume Action   10/21/20 1057 *81 mg Not Given    metoprolol tartrate (LOPRESSOR) tablet 37.5 mg (mg) Total dose:  0 mg*  *Administration not included in total Date/Time Rate/Dose/Volume Action   10/21/20 1057 *37.5 mg Not Given    ramipril (ALTACE) capsule 10 mg (mg) Total dose:  0 mg*  *Administration not included in total Date/Time Rate/Dose/Volume Action   10/21/20 1058 *10 mg Not Given    0.9 % sodium chloride infusion (mL/hr) Total volume:  7.5 mL Dosing weight:  73  Date/Time  Rate/Dose/Volume Action   10/21/20 1025 250 mL - 10 mL/hr New  Bag/Given    Contrast  Medication Name Total Dose  iohexol (OMNIPAQUE) 350 MG/ML injection 45 mL    Radiation/Fluoro  Fluoro time: 5 (min) DAP: 89211 (mGycm2) Cumulative Air Kerma: 473 (mGy)   Coronary Findings   Diagnostic Dominance: Right  No diagnostic findings have been documented.  Intervention   No interventions have been documented.  Right Heart  Right Heart Pressures Hemodynamic findings consistent with mitral valve regurgitation. Right atrial pressure- mean 16 Right ventricular pressure- systolic 97, diastolic 6, mean 15 Pulmonary artery pressure- systolic 80, diastolic 32, mean 51 Pulmonary wedge pressure- V wave 42, mean 34 LVEDP-16 Cardiac output- 4.9 L/min with an index of 2.8 L/min/m.   Coronary Diagrams   Diagnostic Dominance: Right    Intervention    Implants    Vascular Products   Closure Mynx Control 14f-- [id number] Implanted  Inventory item: [facility]URE [facility]ON[facility] 62F Model/Cat number: [id number] Manufacturer: [employer] number: [id number] Device identifier: [id number] identifier type: GS1   GUDID Information  Request status Successful    Brand name: MYNX CONTROL Version/Model: [id number] Company name: [employer] info as of 10/21/20: MR Safe  Contains dry or latex rubber: No    GMDN P.T. name: Wound hydrogel dressing, non-antimicrobial     As of 10/21/2020  Status: Implanted        Syngo Images  Show images for CARDIAC CATHETERIZATION  Images on Long Term Storage  Show images for Mckain, Korrina Laverne  Link to Procedure Log  Procedure Log     Hemo Data  Flowsheet Row Most Recent Value  Fick Cardiac Output 4.93 L/min  Fick Cardiac Output Index 2.77 (L/min)/BSA  Aortic Mean Gradient 2.7 mmHg  Aortic Peak Gradient 0 mmHg  Aortic Valve Area >3.50  Aortic Value Area Index 1.96 cm2/BSA  RA A Wave -99 mmHg  RA V Wave 19 mmHg  RA Mean 16 mmHg  RV Systolic Pressure 97 mmHg   RV Diastolic Pressure 6 mmHg  RV EDP 15 mmHg  PA Systolic Pressure 80 mmHg  PA Diastolic Pressure 32 mmHg  PA Mean 51 mmHg  PW A Wave -99 mmHg  PW V Wave 42 mmHg  PW Mean 34 mmHg  AO Systolic Pressure 1720mmHg  AO Diastolic Pressure 61 mmHg  AO Mean 84 mmHg  LV Systolic Pressure 1947mmHg  LV Diastolic Pressure 6 mmHg  LV EDP 16 mmHg  AOp Systolic Pressure 1096mmHg  AOp Diastolic Pressure 68 mmHg  AOp Mean Pressure 97 mmHg  LVp Systolic Pressure 1283mmHg  LVp Diastolic Pressure 7 mmHg  LVp EDP Pressure 16 mmHg  QP/QS 1  TPVR Index 18.45 HRUI  TSVR Index 30.75 HRUI  PVR SVR Ratio 0.25  TPVR/TSVR Ratio 0.6        Impression:  Patient has stage D severe symptomatic primary mitral regurgitation complicated by longstanding persistent atrial fibrillation, severe pulmonary hypertension, and moderate right ventricular dysfunction with moderate tricuspid regurgitation.  She describes a long history of symptoms of exertional shortness of breath consistent with chronic diastolic congestive heart failure which has accelerated considerably over the past year, recently New York Heart Association functional class IIIb.  Over the past 2 weeks she has improved with increased diuretic therapy.  I have personally reviewed the patient's transthoracic echocardiogram performed last August as well as both the TEE and diagnostic cardiac catheterization  performed yesterday.  TEE demonstrates severe mitral regurgitation with severe left atrial enlargement.  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  There is mild to moderate leaflet thickening involving both the anterior and posterior leaflet with somewhat reduced leaflet mobility.  There is some calcification of the subvalvular apparatus and there may be some foreshortening, although this was not well visualized.  There is no mitral stenosis.  There is also  significant annular dilatation suggesting that there may be a component of atrial mitral regurgitation (Carpentier a type I) there is no evidence for myxomatous disease and there is no mitral valve prolapse.  Left ventricular function appears somewhat reduced in comparison with the transthoracic echocardiogram performed last August.  There is moderate right ventricular dysfunction although the right ventricle is not significantly enlarged.  There is moderate secondary tricuspid regurgitation.  Diagnostic cardiac catheterization is notable for the absence of significant coronary artery disease but confirmed the presence of severe pulmonary hypertension with preserved cardiac output.  The patient would likely benefit from mitral valve repair or replacement although risks associated with conventional surgery would be elevated.  Based upon review of the patient's transesophageal echocardiogram I feel there is probably a 50% chance that the valve might be repairable with conventional surgical techniques, although valve replacement might be necessary.  Concomitant tricuspid valve repair may be indicated and Maze procedure or clipping of the left atrial appendage could be considered.  The patient has poor dentition with loose tooth and has not seen a dentist in many years.  This will need to be addressed.    Plan:  I discussed matters at length with the patient at the bedside this morning.  She was counseled at length regarding her diagnosis of severe primary mitral regurgitation complicated by acute on chronic diastolic congestive heart failure, longstanding persistent atrial fibrillation, severe pulmonary hypertension, and significant right ventricular dysfunction.   We discussed treatment options including high risk surgical intervention, long-term medical therapy without intervention, possible consideration of transcatheter edge-to-edge repair if technically feasible.  Possible referral to a tertiary care  center has been offered as an alternative.  All of her questions have been addressed.  The patient's TEE and other diagnostic tests will be reviewed by multidisciplinary team of specialists.  The patient will be referred to the dental service for consultation to consider dental extraction during this admission.   I spent in excess of 120 minutes during the conduct of this hospital consultation and >50% of this time involved direct face-to-face encounter for counseling and/or coordination of the patient's care.    Valentina Gu. Roxy Manns, MD 10/22/2020 5:58 AM

## 2020-10-22 NOTE — Plan of Care (Signed)
  Problem: Education: Goal: Ability to demonstrate management of disease process will improve Outcome: Progressing Goal: Ability to verbalize understanding of medication therapies will improve Outcome: Progressing   Problem: Activity: Goal: Capacity to carry out activities will improve Outcome: Progressing   Problem: Cardiac: Goal: Ability to achieve and maintain adequate cardiopulmonary perfusion will improve Outcome: Progressing   

## 2020-10-22 NOTE — Progress Notes (Signed)
Joanna Hood DOB Sep 30, 1945  This looks like a clippable valve. The fossa looks approachable for transseptal puncture in both the SAXB and Bicaval views. LA dimensions are large enough for device steering and straddle. TR is noted. The MR jet is appears to be originating from the central to lateral part of the valve and is caused in part by leaflet prolapse. The posterior leaflet is measures 1.57 cm in the 142 LVOT grasping view. MVA measures about 5.5cm2 in the Transgastric view. MVA and gradient were not provided; I'd like to verify both in the procedure prior to the start. Based on this information, an NTW or XTW could be appropriate.  Thank you for uploading!

## 2020-10-22 NOTE — Progress Notes (Signed)
Cylinder for heparin Indication: atrial fibrillation  Allergies  Allergen Reactions  . Metronidazole Itching and Rash    Patient Measurements: Height: _0  (162.6 cm) Weight: 67.7 kg (149 lb 4.8 oz) (scale b) IBW/kg (Calculated) : 54.7 Heparin Dosing Weight: 67kg  Vital Signs: Temp: 98.4 F (36.9 C) (05/03 0910) Temp Source: Oral (05/03 0910) BP: 146/73 (05/03 0910) Pulse Rate: 62 (05/03 0910)  Labs: Recent Labs    10/21/20 1454 10/22/20 0345 10/22/20 1341  HGB 12.2 10.0* 11.2*  HCT 36.3 30.1* 33.6*  PLT 202 166 188  CREATININE 0.86 0.74  --     Estimated Creatinine Clearance: 59.2 mL/min (by C-G formula based on SCr of 0.74 mg/dL).   Medical History: Past Medical History:  Diagnosis Date  . Atrial fibrillation with RVR ([place]) 2014   persistent  . Chronic diastolic CHF (congestive heart failure) (H[facility])    HTN  . Chronic diastolic congestive heart failure ([place])   . COVID-19 virus infection 01/20/2019  . ESOPHAGEAL STRICTURE 02/19/2005   Qualifier: Diagnosis of  By: Amil Amen MD, Benjamine Mola    . Goiter   . History of anemia    unclear cause, now resolved  . History of chicken pox   . History of colon polyps    benign  . HLD (hyperlipidemia)   . Hypothyroidism   . Malignant hypertension longstanding  . Microcytosis   . Mitral valve regurgitation   . Osteopenia 12/2013   T -2.3 hip  . Osteoporosis   . Pulmonary hypertension ([place])   . Rheumatoid arthritis ([place]) 11/2015   +RF, +CCP, ESR 49, synovitis on exam and [place] 11/2015 [facility])  . Right lower lobe pneumonia 07/24/2014  . Tricuspid regurgitation   . Vitamin D deficiency      Assessment: 79 yoF admitted with LHC and TEE found to have severe mitral regurgitation with plans for MVR. Pt on apixaban PTA for hx AFib (last dose 4/29), pharmacy to start IV heparin perioperatively. CBC stable, SQ heparin given earlier this morning.  Goal of Therapy:  Heparin level 0.3-0.7  units/ml Monitor platelets by anticoagulation protocol: Yes   Plan:  Heparin 950 units/h  Check heparin level in 8h Daily heparin level and CBC   Arrie Senate, PharmD, [place], [facility] Clinical Pharmacist [phone] Please check AMION for all [facility] numbers 10/22/2020

## 2020-10-23 DIAGNOSIS — Z888 Allergy status to other drugs, medicaments and biological substances status: Secondary | ICD-10-CM | POA: Diagnosis not present

## 2020-10-23 DIAGNOSIS — I5033 Acute on chronic diastolic (congestive) heart failure: Secondary | ICD-10-CM | POA: Diagnosis not present

## 2020-10-23 DIAGNOSIS — I5032 Chronic diastolic (congestive) heart failure: Secondary | ICD-10-CM | POA: Diagnosis not present

## 2020-10-23 DIAGNOSIS — I4892 Unspecified atrial flutter: Secondary | ICD-10-CM | POA: Diagnosis not present

## 2020-10-23 DIAGNOSIS — K0530 Chronic periodontitis, unspecified: Secondary | ICD-10-CM

## 2020-10-23 DIAGNOSIS — E663 Overweight: Secondary | ICD-10-CM | POA: Diagnosis present

## 2020-10-23 DIAGNOSIS — K089 Disorder of teeth and supporting structures, unspecified: Secondary | ICD-10-CM | POA: Diagnosis not present

## 2020-10-23 DIAGNOSIS — K029 Dental caries, unspecified: Secondary | ICD-10-CM | POA: Diagnosis not present

## 2020-10-23 DIAGNOSIS — I517 Cardiomegaly: Secondary | ICD-10-CM | POA: Diagnosis not present

## 2020-10-23 DIAGNOSIS — Z953 Presence of xenogenic heart valve: Secondary | ICD-10-CM | POA: Diagnosis not present

## 2020-10-23 DIAGNOSIS — I4821 Permanent atrial fibrillation: Secondary | ICD-10-CM | POA: Diagnosis not present

## 2020-10-23 DIAGNOSIS — I110 Hypertensive heart disease with heart failure: Secondary | ICD-10-CM | POA: Diagnosis present

## 2020-10-23 DIAGNOSIS — I5043 Acute on chronic combined systolic (congestive) and diastolic (congestive) heart failure: Secondary | ICD-10-CM | POA: Diagnosis not present

## 2020-10-23 DIAGNOSIS — Z6825 Body mass index (BMI) 25.0-25.9, adult: Secondary | ICD-10-CM | POA: Diagnosis not present

## 2020-10-23 DIAGNOSIS — J9811 Atelectasis: Secondary | ICD-10-CM | POA: Diagnosis not present

## 2020-10-23 DIAGNOSIS — I051 Rheumatic mitral insufficiency: Secondary | ICD-10-CM | POA: Diagnosis not present

## 2020-10-23 DIAGNOSIS — K083 Retained dental root: Secondary | ICD-10-CM | POA: Diagnosis present

## 2020-10-23 DIAGNOSIS — Z79890 Hormone replacement therapy (postmenopausal): Secondary | ICD-10-CM | POA: Diagnosis not present

## 2020-10-23 DIAGNOSIS — Z79899 Other long term (current) drug therapy: Secondary | ICD-10-CM | POA: Diagnosis not present

## 2020-10-23 DIAGNOSIS — J811 Chronic pulmonary edema: Secondary | ICD-10-CM | POA: Diagnosis not present

## 2020-10-23 DIAGNOSIS — R0600 Dyspnea, unspecified: Secondary | ICD-10-CM | POA: Diagnosis not present

## 2020-10-23 DIAGNOSIS — K045 Chronic apical periodontitis: Secondary | ICD-10-CM | POA: Diagnosis not present

## 2020-10-23 DIAGNOSIS — Z7901 Long term (current) use of anticoagulants: Secondary | ICD-10-CM | POA: Diagnosis not present

## 2020-10-23 DIAGNOSIS — E871 Hypo-osmolality and hyponatremia: Secondary | ICD-10-CM | POA: Diagnosis not present

## 2020-10-23 DIAGNOSIS — Z8616 Personal history of COVID-19: Secondary | ICD-10-CM | POA: Diagnosis not present

## 2020-10-23 DIAGNOSIS — D696 Thrombocytopenia, unspecified: Secondary | ICD-10-CM | POA: Diagnosis not present

## 2020-10-23 DIAGNOSIS — I340 Nonrheumatic mitral (valve) insufficiency: Secondary | ICD-10-CM | POA: Diagnosis not present

## 2020-10-23 DIAGNOSIS — I700 Atherosclerosis of aorta: Secondary | ICD-10-CM | POA: Diagnosis present

## 2020-10-23 DIAGNOSIS — K0889 Other specified disorders of teeth and supporting structures: Secondary | ICD-10-CM

## 2020-10-23 DIAGNOSIS — I272 Pulmonary hypertension, unspecified: Secondary | ICD-10-CM | POA: Diagnosis not present

## 2020-10-23 DIAGNOSIS — M069 Rheumatoid arthritis, unspecified: Secondary | ICD-10-CM | POA: Diagnosis present

## 2020-10-23 DIAGNOSIS — D62 Acute posthemorrhagic anemia: Secondary | ICD-10-CM | POA: Diagnosis not present

## 2020-10-23 DIAGNOSIS — E039 Hypothyroidism, unspecified: Secondary | ICD-10-CM | POA: Diagnosis present

## 2020-10-23 DIAGNOSIS — Z01818 Encounter for other preprocedural examination: Secondary | ICD-10-CM | POA: Diagnosis not present

## 2020-10-23 DIAGNOSIS — Z87891 Personal history of nicotine dependence: Secondary | ICD-10-CM | POA: Diagnosis not present

## 2020-10-23 LAB — COMPREHENSIVE METABOLIC PANEL
ALT: 26 U/L (ref 0–44)
AST: 25 U/L (ref 15–41)
Albumin: 3.1 g/dL — ABNORMAL LOW (ref 3.5–5.0)
Alkaline Phosphatase: 68 U/L (ref 38–126)
Anion gap: 9 (ref 5–15)
BUN: 11 mg/dL (ref 8–23)
CO2: 26 mmol/L (ref 22–32)
Calcium: 8.6 mg/dL — ABNORMAL LOW (ref 8.9–10.3)
Chloride: 101 mmol/L (ref 98–111)
Creatinine, Ser: 0.85 mg/dL (ref 0.44–1.00)
GFR, Estimated: >60 mL/min (ref >60)
Glucose, Bld: 94 mg/dL (ref 70–99)
Potassium: 2.8 mmol/L — ABNORMAL LOW (ref 3.5–5.1)
Sodium: 136 mmol/L (ref 135–145)
Total Bilirubin: 1.8 mg/dL — ABNORMAL HIGH (ref 0.3–1.2)
Total Protein: 6.7 g/dL (ref 6.5–8.1)

## 2020-10-23 LAB — PREALBUMIN: Prealbumin: 11.2 mg/dL — ABNORMAL LOW (ref 18–38)

## 2020-10-23 LAB — CBC
HCT: 31.9 % — ABNORMAL LOW (ref 36.0–46.0)
Hemoglobin: 10.7 g/dL — ABNORMAL LOW (ref 12.0–15.0)
MCH: 26.9 pg (ref 26.0–34.0)
MCHC: 33.5 g/dL (ref 30.0–36.0)
MCV: 80.2 fL (ref 80.0–100.0)
Platelets: 175 10*3/uL (ref 150–400)
RBC: 3.98 MIL/uL (ref 3.87–5.11)
RDW: 14.9 % (ref 11.5–15.5)
WBC: 6.6 10*3/uL (ref 4.0–10.5)
nRBC: 0 % (ref 0.0–0.2)

## 2020-10-23 LAB — SURGICAL PCR SCREEN
MRSA, PCR: NEGATIVE
Staphylococcus aureus: NEGATIVE

## 2020-10-23 LAB — BRAIN NATRIURETIC PEPTIDE: B Natriuretic Peptide: 490.4 pg/mL — ABNORMAL HIGH (ref 0.0–100.0)

## 2020-10-23 LAB — HEPARIN LEVEL (UNFRACTIONATED)
Heparin Unfractionated: 0.4 IU/mL (ref 0.30–0.70)
Heparin Unfractionated: 0.49 IU/mL (ref 0.30–0.70)

## 2020-10-23 LAB — MAGNESIUM: Magnesium: 1.4 mg/dL — ABNORMAL LOW (ref 1.7–2.4)

## 2020-10-23 LAB — POTASSIUM: Potassium: 3.2 mmol/L — ABNORMAL LOW (ref 3.5–5.1)

## 2020-10-23 LAB — APTT: aPTT: 75 seconds — ABNORMAL HIGH (ref 24–36)

## 2020-10-23 MED ORDER — CEFAZOLIN SODIUM-DEXTROSE 2-4 GM/100ML-% IV SOLN
2.0000 g | INTRAVENOUS | Status: AC
  Administered 2020-10-24: 2 g via INTRAVENOUS
  Filled 2020-10-23: qty 100

## 2020-10-23 MED ORDER — POTASSIUM CHLORIDE CRYS ER 20 MEQ PO TBCR
40.0000 meq | EXTENDED_RELEASE_TABLET | ORAL | Status: AC
  Administered 2020-10-23 (×2): 40 meq via ORAL
  Filled 2020-10-23 (×2): qty 2

## 2020-10-23 MED ORDER — POTASSIUM CHLORIDE CRYS ER 20 MEQ PO TBCR: 60.0000 meq | EXTENDED_RELEASE_TABLET | ORAL | Status: DC

## 2020-10-23 MED ORDER — POTASSIUM CHLORIDE CRYS ER 20 MEQ PO TBCR
60.0000 meq | EXTENDED_RELEASE_TABLET | ORAL | Status: AC
  Administered 2020-10-23: 60 meq via ORAL
  Filled 2020-10-23: qty 3

## 2020-10-23 MED ORDER — MAGNESIUM SULFATE 4 GM/100ML IV SOLN
4.0000 g | Freq: Once | INTRAVENOUS | Status: AC
  Administered 2020-10-23: 4 g via INTRAVENOUS
  Filled 2020-10-23: qty 100

## 2020-10-23 MED ORDER — DOCUSATE SODIUM 100 MG PO CAPS
100.0000 mg | ORAL_CAPSULE | Freq: Every day | ORAL | Status: DC
  Administered 2020-10-23 – 2020-10-27 (×5): 100 mg via ORAL
  Filled 2020-10-23 (×5): qty 1

## 2020-10-23 NOTE — Progress Notes (Addendum)
Advanced Heart Failure Rounding Note  PCP-Cardiologist: Dr. Gwenlyn Found AHF: Dr. [name]   Patient Profile   [name], followed by Dr. Gwenlyn Found, w/ h/o chronic diastolic heart failure w/ predominant RV dysfunction + pulmonary hypertension, chronic atrial fibrillation on Eliquis, severe mitral regurgitation, systemic HTN and rheumatoid arthritis on Methotrexate, being evaluated for mitral valve repair vs replacement.   Subjective:    Volume status improved. No dyspnea.   K low at 2.8   Feels ok. No complaints.   Awaiting dental consult. Orthopantogram completed yesterday showing poor dentition with probable caries in left upper incisors.   Objective:   Weight Range: 66.7 kg Body mass index is 25.23 kg/m.   Vital Signs:   Temp:  [97.5 F (36.4 C)-98.4 F (36.9 C)] 97.8 F (36.6 C) (05/04 0415) Pulse Rate:  [51-68] 60 (05/04 0812) Resp:  [18] 18 (05/04 0415) BP: (114-158)/(61-74) 130/67 (05/04 0812) SpO2:  [86 %-100 %] 100 % (05/04 0812) Weight:  [66.7 kg] 66.7 kg (05/04 0138) Last BM Date: 10/20/20  Weight change: Filed Weights   10/21/20 1448 10/22/20 0406 10/23/20 0138  Weight: 68.3 kg 67.7 kg 66.7 kg    Intake/Output:   Intake/Output Summary (Last 24 hours) at 10/23/2020 0855 Last data filed at 10/23/2020 0821 Gross per 24 hour  Intake 1216.55 ml  Output 2850 ml  Net -1633.45 ml      Physical Exam    General:  Well appearing. No resp difficulty HEENT: dentition in poor repair otherwise normal Neck: Supple. JVP not elevated . Carotids 2+ bilat; no bruits. No lymphadenopathy or thyromegaly appreciated. Cor: PMI nondisplaced. Irregularly irregular rhythm and rate. + 3/6 MR murmur at apex Lungs: Clear Abdomen: Soft, nontender, nondistended. No hepatosplenomegaly. No bruits or masses. Good bowel sounds. Extremities: No cyanosis, clubbing, rash, edema Neuro: Alert & orientedx3, cranial nerves grossly intact. moves all 4 extremities w/o difficulty. Affect  pleasant   Telemetry   Chronic afib, HR 60s  EKG    No new EKG to review   Labs    CBC Recent Labs    10/22/20 1341 10/23/20 0006  WBC 7.1 6.6  HGB 11.2* 10.7*  HCT 33.6* 31.9*  MCV 80.4 80.2  PLT 188 443   Basic Metabolic Panel Recent Labs    10/22/20 0345 10/23/20 0006  NA 136 136  K 3.2* 2.8*  CL 103 101  CO2 24 26  GLUCOSE 91 94  BUN 9 11  CREATININE 0.74 0.85  CALCIUM 8.6* 8.6*   Liver Function Tests Recent Labs    10/23/20 0006  AST 25  ALT 26  ALKPHOS 68  BILITOT 1.8*  PROT 6.7  ALBUMIN 3.1*   No results for input(s): LIPASE, AMYLASE in the last 72 hours. Cardiac Enzymes No results for input(s): CKTOTAL, CKMB, CKMBINDEX, TROPONINI in the last 72 hours.  BNP: BNP (last 3 results) Recent Labs    10/23/20 0006  BNP 490.4*    ProBNP (last 3 results) No results for input(s): PROBNP in the last 8760 hours.   D-Dimer No results for input(s): DDIMER in the last 72 hours. Hemoglobin A1C No results for input(s): HGBA1C in the last 72 hours. Fasting Lipid Panel No results for input(s): CHOL, HDL, LDLCALC, TRIG, CHOLHDL, LDLDIRECT in the last 72 hours. Thyroid Function Tests No results for input(s): TSH, T4TOTAL, T3FREE, THYROIDAB in the last 72 hours.  Invalid input(s): FREET3  Other results:   Imaging    DG Orthopantogram  Result Date: 10/22/2020 CLINICAL DATA:  Preop study.  Poor dentition. EXAM: ORTHOPANTOGRAM/PANORAMIC COMPARISON:  No recent prior. FINDINGS: Absence of multiple teeth, particularly the lower teeth, noted bilaterally. Poor dentition with probable caries, particularly in the left upper incisors. No definite periapical lucency noted. IMPRESSION: Poor dentition as above.  No definite periapical lucency noted. Electronically Signed   By: Maisie Fus  Register   On: 10/22/2020 09:23   DG Chest 2 View  Result Date: 10/22/2020 CLINICAL DATA:  Preoperative study for heart surgery. EXAM: CHEST - 2 VIEW COMPARISON:  06/20/2020.  FINDINGS: Stable severe cardiomegaly. Pulmonary venous congestion again noted. Mild bilateral interstitial prominence is present. Mild bone CHF may be present. No pleural effusion or pneumothorax. Degenerative change thoracic spine. IMPRESSION: Stable severe cardiomegaly. Pulmonary venous congestion again noted. Mild bilateral interstitial prominence noted. A mild component of CHF may be present. Electronically Signed   By: Maisie Fus  Register   On: 10/22/2020 09:19   VAS US DOPPLER PRE CABG  Result Date: 10/22/2020 PREOPERATIVE VASCULAR EVALUATION Patient Name:  Joanna Hood  Date of Exam:   10/22/2020 Medical Rec #: [id number]             Accession #:    [id number] Date of Birth: 1945-09-09             Patient Gender: F Patient Age:   20Y Exam Location:  [facility] Procedure:      VAS US DOPPLER PRE CABG Referring Phys: [id number] CLARENCE H OWEN --------------------------------------------------------------------------------  Indications:      Pre-CABG. Risk Factors:     Hypertension, hyperlipidemia. Comparison Study: no prior Performing Technologist: Blanch Media RVS  Examination Guidelines: A complete evaluation includes B-mode imaging, spectral Doppler, color Doppler, and power Doppler as needed of all accessible portions of each vessel. Bilateral testing is considered an integral part of a complete examination. Limited examinations for reoccurring indications may be performed as noted.  Right Carotid Findings: +----------+--------+--------+--------+------------+--------+           PSV cm/sEDV cm/sStenosisDescribe    Comments +----------+--------+--------+--------+------------+--------+ CCA Prox  73      16              heterogenous         +----------+--------+--------+--------+------------+--------+ CCA Distal66      11              heterogenous         +----------+--------+--------+--------+------------+--------+ ICA Prox  80      19      1-39%   heterogenous          +----------+--------+--------+--------+------------+--------+ ICA Distal124     39                                   +----------+--------+--------+--------+------------+--------+ ECA       60      8                                    +----------+--------+--------+--------+------------+--------+ Portions of this table do not appear on this page. +----------+--------+-------+--------+------------+           PSV cm/sEDV cmsDescribeArm Pressure +----------+--------+-------+--------+------------+ Subclavian157                                 +----------+--------+-------+--------+------------+ +---------+--------+--+--------+-+---------+ VertebralPSV cm/s54EDV cm/s9Antegrade +---------+--------+--+--------+-+---------+ Left Carotid Findings: +----------+--------+--------+--------+------------+--------+  PSV cm/sEDV cm/sStenosisDescribe    Comments +----------+--------+--------+--------+------------+--------+ CCA Prox  84      15              heterogenous         +----------+--------+--------+--------+------------+--------+ CCA Distal59      12              heterogenous         +----------+--------+--------+--------+------------+--------+ ICA Prox  58      18      1-39%   heterogenous         +----------+--------+--------+--------+------------+--------+ ICA Distal110     35                                   +----------+--------+--------+--------+------------+--------+ ECA       89      11                                   +----------+--------+--------+--------+------------+--------+ +----------+--------+--------+--------+------------+ SubclavianPSV cm/sEDV cm/sDescribeArm Pressure +----------+--------+--------+--------+------------+           82                                   +----------+--------+--------+--------+------------+ +---------+--------+--+--------+--+---------+ VertebralPSV cm/s49EDV cm/s11Antegrade  +---------+--------+--+--------+--+---------+  ABI Findings: +--------+------------------+-----+---------+--------+ Right   Rt Pressure (mmHg)IndexWaveform Comment  +--------+------------------+-----+---------+--------+ Brachial                       triphasic         +--------+------------------+-----+---------+--------+ +--------+------------------+-----+---------+-------+ Left    Lt Pressure (mmHg)IndexWaveform Comment +--------+------------------+-----+---------+-------+ Brachial                       triphasic        +--------+------------------+-----+---------+-------+  Right Doppler Findings: +--------+--------+-----+---------+--------+ Site    PressureIndexDoppler  Comments +--------+--------+-----+---------+--------+ Brachial             triphasic         +--------+--------+-----+---------+--------+ Radial                        occluded +--------+--------+-----+---------+--------+ Ulnar                triphasic         +--------+--------+-----+---------+--------+  Left Doppler Findings: +--------+--------+-----+---------+--------+ Site    PressureIndexDoppler  Comments +--------+--------+-----+---------+--------+ Brachial             triphasic         +--------+--------+-----+---------+--------+ Radial               triphasic         +--------+--------+-----+---------+--------+ Ulnar                triphasic         +--------+--------+-----+---------+--------+  Summary: Right Carotid: Velocities in the right ICA are consistent with a 1-39% stenosis. Left Carotid: Velocities in the left ICA are consistent with a 1-39% stenosis. Vertebrals: Bilateral vertebral arteries demonstrate antegrade flow. Right Upper Extremity: Radial compression not reliable due to radial artery occlusion. Doppler waveform obliterate with right ulnar compression. Left Upper Extremity: Doppler waveforms remain within normal limits with left radial compression.  Doppler waveforms decrease 50% with left ulnar compression.  Electronically signed by Curt Jews MD on 10/22/2020 at 8:17:34 PM.  Final      Medications:     Scheduled Medications: . amLODipine  10 mg Oral Daily  . aspirin EC  81 mg Oral Once  . calcium-vitamin D  1 tablet Oral Daily  . cholecalciferol  1,000 Units Oral Daily  . furosemide  40 mg Intravenous BID  . levothyroxine  125 mcg Oral Q0600  . metoprolol tartrate  37.5 mg Oral BID  . potassium chloride SA  20 mEq Oral Daily  . ramipril  10 mg Oral BID  . sodium chloride flush  3 mL Intravenous Q12H  . sodium chloride flush  3 mL Intravenous Q12H    Infusions: . sodium chloride    . heparin 950 Units/hr (10/22/20 1508)    PRN Medications: sodium chloride, acetaminophen, morphine injection, ondansetron (ZOFRAN) IV, sodium chloride flush    Assessment/Plan   1. Severe Rheumatic Mitral Valve Regurgitation  - Primary MR. LVEF normal 60-65%  - TEE this admit confirmed severe MR. LHC w/ normal coronaries - Not candidate for MitraClip per Dr. Burt Knack  - CT surgery consulted for potential minimally invasive mitral valve repair - Will need tooth extractions for dental carries. Dental Consult placed   2. Severe Pulmonary Hypertension  - TTE 8/21 w/ severely elevated RVSP, 78 mmHg. 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 WHO Group 1 2/2 CTD (RA)  - she will need repeat echo post MVR to reassess RVSP. If still elevated post valve repair, will need additional w/u to exclude other etiologies, including PFTs, V/Q scan, sleep study   3. Acute on Chronic Diastolic Heart Failure w/ Predominant RV Dysfunction  - 2D Echo 8/21 showed normal LVEF, 60-65%, w/ mild LVH and G3DD c/w restrictive physiology. RV was mildly reduced at the time - TEE 10/21/20 LVEF  normal. RV systolic function is now moderately reduced w/ mild-mod TR, in setting of progressive/ now severe MR. - RHC this admit; RAP 16, LVEDP 16. Cardiac output- 4.9 L/min with an index of 2.8 L/min/m. - Volume status improved w/ diuresis. Stop IV Lasix today. Transition back to PO tomorrow - Continue ? blocker and home antihypertensives for BP/HR control   4. Chronic Afib: in setting of severe MR w/ severe LAE. - rate controlled w/ metoprolol  - Eliquis currently on hold. Covering w/ heparin until invasive procedures are completed  5. Rheumatoid Arthritis - on MTX - followed by rheumatology   6. Hyponatremia - K 2.8  - aggressively supp w/ KCl d/w pharmD - repeat BMP this afternoon and check Mg level    Length of Stay: [place], PA-C  10/23/2020, 8:55 AM  Advanced Heart Failure Team Pager [phone] (M-F; 7a - 5p)  Please contact [facility] for night-coverage after hours (5p -7a ) and weekends on [email or web address]  Patient seen and examined with the above-signed Advanced Practice Provider and/or Housestaff. I personally reviewed laboratory data, imaging studies and relevant notes. I independently examined the patient and formulated the important aspects of the plan. I have edited the note to reflect any of my changes or salient points. I have personally discussed the plan with the patient and/or family.  Much improved with diuresis. Denies SOB, orthopnea or PND. Now on heparin. Awaiting dental eval. K low  General:  Well appearing. No resp difficulty HEENT: normal Neck: supple.  no JVD. Carotids 2+ bilat; no bruits. No lymphadenopathy or thryomegaly appreciated. Cor: PMI nondisplaced. Irregular rate & rhythm. 2/6 MRs. Lungs: clear Abdomen: soft, nontender, nondistended. No hepatosplenomegaly. No bruits or masses. Good bowel sounds. Extremities: no cyanosis, clubbing, rash, edema Neuro: alert & orientedx3, cranial nerves grossly intact. moves all 4 extremities w/o  difficulty. Affect pleasant  Agree with stopping IV lasix. Supp K. Continue heparin. Await dental eval. Will discuss timing of potential surgery with Dr. Roxy Manns. Consider repeat RHC prior to OR to make sure she is optimized.   Glori Bickers, MD  11:08 AM

## 2020-10-23 NOTE — Progress Notes (Signed)
CARDIAC REHAB PHASE I   PRE:  Rate/Rhythm: 57 SB  BP:  Supine:   Sitting: 136/71  Standing:    SaO2: 95%RA  MODE:  Ambulation: 550 ft   POST:  Rate/Rhythm: 62  BP:  Supine:   Sitting: 147/62  Standing:    SaO2: 94%RA 1250-1330 Pt walked 550 ft pushing IV pole and tolerated well. Gait steady. Denied SOB. Gave pt OHS booklet, care guide and wrote down how to view pre op video. Discussed staying in the tube and sternal precautions in case pt has sternal incision. Discussed importance of IS and mobility after surgery. Pt given IS and only reaching 500 ml. Encouraged pt to continue to use. Pt stated she will have someone to assist with care once discharged after surgery. Will continue to see pt as low priority. Encouraged her to walk with staff/ Mobility specialist.   Graylon Good, RN BSN  10/23/2020 1:26 PM

## 2020-10-23 NOTE — Progress Notes (Signed)
ANTICOAGULATION CONSULT NOTE - Follow Up Consult  Pharmacy Consult for heparin Indication: atrial fibrillation  Labs: Recent Labs    10/21/20 1454 10/22/20 0345 10/22/20 1341 10/23/20 0006  HGB 12.2 10.0* 11.2* 10.7*  HCT 36.3 30.1* 33.6* 31.9*  PLT 202 166 188 175  APTT  --   --   --  75*  HEPARINUNFRC  --   --   --  0.40  CREATININE 0.86 0.74  --   --     Assessment/Plan:  75yo female therapeutic on heparin with initial dosing while awaiting surgery. Will continue gtt at current rate of 950 units/hr and confirm stable with additional level.    Wynona Neat, PharmD, BCPS  10/23/2020,1:26 AM

## 2020-10-23 NOTE — TOC Initial Note (Signed)
Transition of Care (TOC) - Initial/Assessment Note  Heart Failure   Patient Details  Name: Joanna Hood MRN: [id number] Date of Birth: 06/01/1946  Transition of Care [facility]) CM/SW Contact:    [place], [place] Phone Number: 10/23/2020, 11:48 AM  Clinical Narrative:                 CSW spoke with the patient at bedside and completed a very brief SDOH screening with the patient who denied having any needs at this time. Patient reported they do have a PCP, Ria Bush, MD and they can get to the pharmacy to pick up their medications. The patient said that her daughter provides her with transportation, and she is able to get to her appointments and get medications. CSW provided the patient with the social workers name, number and position and to reach out to CSW as other social needs arise.       TOC will continue to follow for d/c needs.     Barriers to Discharge: Continued Medical Work up   Patient Goals and CMS Choice        Expected Discharge Plan and Services   In-house Referral: Clinical Social Work       Expected Discharge Date: 10/21/20                                    Prior Living Arrangements/Services   Lives with:: [place] Patient language and need for interpreter reviewed:: Yes Do you feel safe going back to the place where you live?: Yes      Need for Family Participation in Patient Care: No (Comment) Care giver support system in place?: No (comment)   Criminal Activity/Legal Involvement Pertinent to Current Situation/Hospitalization: No - Comment as needed  Activities of Daily Living      Permission Sought/Granted                  Emotional Assessment Appearance:: Appears stated age Attitude/Demeanor/Rapport: Engaged Affect (typically observed): Pleasant Orientation: : Oriented to Self,Oriented to Place,Oriented to  Time,Oriented to Situation Alcohol / Substance Use: Not Applicable Psych  Involvement: No (comment)  Admission diagnosis:  Dyspnea [R06.00] Patient Active Problem List   Diagnosis Date Noted  . Pulmonary hypertension ([place])   . Chronic diastolic congestive heart failure ([place])   . Tricuspid regurgitation   . Dyspnea 10/21/2020  . Unintentional weight loss 09/17/2020  . Renal insufficiency 09/17/2020  . Cough 06/28/2020  . Grade III diastolic dysfunction 41/93/7902  . Mitral valve regurgitation   . Rheumatoid arthritis ([place]) 12/16/2015  . Health maintenance examination 04/23/2015  . Advanced care planning/counseling discussion 04/23/2015  . Medicare annual wellness visit, subsequent 11/03/2013  . Vitamin D deficiency 11/03/2013  . Microcytic anemia 09/02/2013  . Persistent atrial fibrillation ([place]) 04/29/2013  . HTN (hypertension), malignant 04/29/2013  . Osteopenia 01/01/2010  . POSTMENOPAUSAL STATUS 12/03/2009  . SNORING 02/08/2008  . GOITER, MULTINODULAR 02/16/2007  . Hypothyroidism 02/16/2007  . 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 History of lung abscess 05/23/1999   PCP:  Ria Bush, MD Pharmacy:   [facility] [phone], [place] [address] [place] [address] Phone: [phone] Fax: [phone]  [facility] [place], [place] [address] Phone: [phone] Fax: [phone]  Social Determinants of Health (SDOH) Interventions Food Insecurity Interventions: Intervention Not Indicated Financial Strain Interventions: Intervention Not Indicated Housing Interventions: Intervention Not Indicated Transportation Interventions: Intervention Not Indicated  Readmission Risk Interventions No flowsheet data found.  [place] Devine, MSW, [place] Heart Failure Social Worker

## 2020-10-23 NOTE — Consult Note (Signed)
Department of Dental Medicine     INPATIENT CONSULTATION  Service Date:   10/23/2020 Admitted Date:  10/21/2020  Patient Name:  Joanna Hood Date of Birth:   July 06, 1945 Medical Record Number: [id number]  Referring Provider:              Darylene Price, MD  PLAN & RECOMMENDATIONS   > There are no current signs of acute odontogenic infection including abscess, edema or erythema, or suspicious lesion requiring biopsy.  The patient does have several teeth with deep caries, retained root tips and periodontal disease. >> Recommend extractions of all indicated teeth to decrease the risk of perioperative and postoperative systemic infection and complications. >>> Plan to discuss case with medical team and coordinate treatment as needed.  Plan to complete all necessary dental treatment in the OR under general anesthesia before cardiac surgery.  Heparin will need to be held 4-6 hours prior to scheduled procedure and resumed 6-8 hours afterwards.  >>  Recommend the patient establish care at a dental office of their choice for routine dental care including replacement of missing teeth, cleanings and exams. >>  Discussed in detail all treatment options with the patient and they are agreeable to the plan.   Thank you for consulting with Hospital Dentistry and for the opportunity to participate in this patient's treatment.  Should you have any questions or concerns, please contact the [facility] at [phone].   10/23/2020     CONSULT NOTE   COVID 19 SCREENING: The patient denies symptoms concerning for COVID-19 infection including fever, chills, cough, or newly developed shortness of breath.   HISTORY OF PRESENT ILLNESS: >> Joanna Hood is a very pleasant 75 y.o. female with h/o mitral regurgitation, longstanding persistent atrial fibrillation on long-term anticoagulation Eliquis, HTN, chronic diastolic CHF, pulmonary HTN, rheumatoid arthritis on long-term methotrexate  and chronic anemia who is currently admitted for management of severe mitral regurgitation and surgical consultation.  Hospital dentistry was consulted to evaluate the patient as part of their pre-cardiac surgery work-up.  DENTAL HISTORY: >The patient reports that she does not have a dentist she sees regularly.  She currently denies any dental/orofacial pain or sensitivity. >> Patient is able to manage oral secretions.  Patient denies dysphagia, odynophagia, dysphonia, SOB and neck pain.  Patient denies fever, rigors and malaise.   CHIEF COMPLAINT:  Inpatient preoperative dental consultation.   Patient Active Problem List   Diagnosis Date Noted  . Pulmonary hypertension ([place])   . Chronic diastolic congestive heart failure ([place])   . Tricuspid regurgitation   . Dyspnea 10/21/2020  . Unintentional weight loss 09/17/2020  . Renal insufficiency 09/17/2020  . Cough 06/28/2020  . Grade III diastolic dysfunction 86/57/8469  . Mitral valve regurgitation   . Rheumatoid arthritis ([place]) 12/16/2015  . Health maintenance examination 04/23/2015  . Advanced care planning/counseling discussion 04/23/2015  . Medicare annual wellness visit, subsequent 11/03/2013  . Vitamin D deficiency 11/03/2013  . Microcytic anemia 09/02/2013  . Persistent atrial fibrillation ([place]) 04/29/2013  . HTN (hypertension), malignant 04/29/2013  . Osteopenia 01/01/2010  . POSTMENOPAUSAL STATUS 12/03/2009  . SNORING 02/08/2008  . GOITER, MULTINODULAR 02/16/2007  . Hypothyroidism 02/16/2007  . 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 History of lung abscess 05/23/1999   Past Medical History:  Diagnosis Date  . Atrial fibrillation with RVR ([place]) 2014   persistent  . Chronic diastolic CHF (congestive heart failure) (HCC)    HTN  . Chronic diastolic congestive heart failure ([place])   .  COVID-19 virus infection 01/20/2019  . ESOPHAGEAL STRICTURE 02/19/2005   Qualifier: Diagnosis of  By: Amil Amen MD, Benjamine Mola    .  Goiter   . History of anemia    unclear cause, now resolved  . History of chicken pox   . History of colon polyps    benign  . HLD (hyperlipidemia)   . Hypothyroidism   . Malignant hypertension longstanding  . Microcytosis   . Mitral valve regurgitation   . Osteopenia 12/2013   T -2.3 hip  . Osteoporosis   . Pulmonary hypertension ([place])   . Rheumatoid arthritis ([place]) 11/2015   +RF, +CCP, ESR 49, synovitis on exam and [place] 11/2015 [facility])  . Right lower lobe pneumonia 07/24/2014  . Tricuspid regurgitation   . Vitamin D deficiency    Past Surgical History:  Procedure Laterality Date  . CARDIAC CATHETERIZATION  2014   normal per patient  . COLONOSCOPY  11/2015   TA, int hem, rpt 5 yrs [facility])  . LEFT HEART CATHETERIZATION WITH CORONARY ANGIOGRAM N/A 05/01/2013   Procedure: LEFT HEART CATHETERIZATION WITH CORONARY ANGIOGRAM;  Surgeon: Leonie Man, MD;  Location: [facility] CATH LAB;  Service: Cardiovascular;  Laterality: N/A;  . PARTIAL HYSTERECTOMY  1970   fibroids  . RIGHT/LEFT HEART CATH AND CORONARY ANGIOGRAPHY N/A 10/21/2020   Procedure: RIGHT/LEFT HEART CATH AND CORONARY ANGIOGRAPHY;  Surgeon: Lorretta Harp, MD;  Location: [place] CV LAB;  Service: Cardiovascular;  Laterality: N/A;  . TEE WITHOUT CARDIOVERSION N/A 10/21/2020   Procedure: TRANSESOPHAGEAL ECHOCARDIOGRAM (TEE);  Surgeon: Lelon Perla, MD;  Location: [facility] ENDOSCOPY;  Service: Cardiovascular;  Laterality: N/A;   Allergies  Allergen Reactions  . Metronidazole Itching and Rash   Current Facility-Administered Medications  Medication Dose Route Frequency Provider Last Rate Last Admin  . 0.9 %  sodium chloride infusion  250 mL Intravenous PRN Lorretta Harp, MD      . acetaminophen (TYLENOL) tablet 650 mg  650 mg Oral Q6H PRN Lorretta Harp, MD      . amLODipine (NORVASC) tablet 10 mg  10 mg Oral Daily Lorretta Harp, MD   10 mg at 10/23/20 0815  . aspirin EC tablet 81 mg  81 mg Oral Once Lorretta Harp,  MD      . calcium-vitamin D (OSCAL WITH D) 500-200 MG-UNIT per tablet 1 tablet  1 tablet Oral Daily Lorretta Harp, MD   1 tablet at 10/23/20 0815  . cholecalciferol (VITAMIN D3) tablet 1,000 Units  1,000 Units Oral Daily Lorretta Harp, MD   1,000 Units at 10/23/20 0815  . docusate sodium (COLACE) capsule 100 mg  100 mg Oral Daily Lyda Jester M, PA-C   100 mg at 10/23/20 1100  . heparin ADULT infusion 100 units/mL (25000 units/281m)  950 Units/hr Intravenous Continuous BEinar Grad RPH 9.5 mL/hr at 10/22/20 1508 950 Units/hr at 10/22/20 1508  . levothyroxine (SYNTHROID) tablet 125 mcg  125 mcg Oral Q0600 BLorretta Harp MD   125 mcg at 10/23/20 0631  . metoprolol tartrate (LOPRESSOR) tablet 37.5 mg  37.5 mg Oral BID BLorretta Harp MD   37.5 mg at 10/23/20 0817  . morphine 2 MG/ML injection 2 mg  2 mg Intravenous Q1H PRN BLorretta Harp MD      . ondansetron ([facility] injection 4 mg  4 mg Intravenous Q6H PRN BLorretta Harp MD      . potassium chloride SA (KLOR-CON) CR tablet 20 mEq  20  mEq Oral Daily Lorretta Harp, MD   20 mEq at 10/23/20 0816  . ramipril (ALTACE) capsule 10 mg  10 mg Oral BID Lorretta Harp, MD   10 mg at 10/23/20 0815  . sodium chloride flush (NS) 0.9 % injection 3 mL  3 mL Intravenous Q12H Lorretta Harp, MD   3 mL at 10/22/20 1025  . sodium chloride flush (NS) 0.9 % injection 3 mL  3 mL Intravenous Q12H Lorretta Harp, MD   3 mL at 10/23/20 0820  . sodium chloride flush (NS) 0.9 % injection 3 mL  3 mL Intravenous PRN Lorretta Harp, MD   3 mL at 10/22/20 1024   LABS: Lab Results  Component Value Date   WBC 6.6 10/23/2020   HGB 10.7 (L) 10/23/2020   HCT 31.9 (L) 10/23/2020   MCV 80.2 10/23/2020   PLT 175 10/23/2020      Component Value Date/Time   NA 136 10/23/2020 0006   NA 141 10/14/2020 1158   K 2.8 (L) 10/23/2020 0006   CL 101 10/23/2020 0006   CO2 26 10/23/2020 0006   GLUCOSE 94 10/23/2020 0006   BUN 11  10/23/2020 0006   BUN 10 10/14/2020 1158   CREATININE 0.85 10/23/2020 0006   CREATININE 0.81 09/01/2013 1512   CALCIUM 8.6 (L) 10/23/2020 0006   GFRNONAA >60 10/23/2020 0006   GFRAA 79 (L) 05/03/2013 0455   Lab Results  Component Value Date   INR 1.00 05/01/2013   No results found for: PTT  Social History   Socioeconomic History  . Marital status: Single    Spouse name: Not on file  . Number of children: 1  . Years of education: Not on file  . Highest education level: Not on file  Occupational History  . Occupation: Retired  Tobacco Use  . Smoking status: Former Smoker    Quit date: 05/16/1969    Years since quitting: 51.4  . Smokeless tobacco: Former Network engineer  . Vaping Use: Never used  Substance and Sexual Activity  . Alcohol use: No    Alcohol/week: 0.0 standard drinks  . Drug use: No  . Sexual activity: Never  Other Topics Concern  . 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 Difficulty of Paying Living Expenses: Not hard at all  Food Insecurity: No Food Insecurity  . Worried About Charity fundraiser in the Last Year: Never true  . Ran Out of Food in the Last Year: Never true  Transportation Needs: No Transportation Needs  . Lack of Transportation (Medical): No  . Lack of Transportation (Non-Medical): No  Physical Activity: Inactive  . Days of Exercise per Week: 0 days  . Minutes of Exercise per Session: 0 min  Stress: No Stress Concern Present  . Feeling of Stress : Not at all  Social Connections: Not on file  Intimate Partner Violence: Not At Risk  . Fear of Current or Ex-Partner: No  . Emotionally Abused: No  . Physically Abused: No  . Sexually Abused: No   Family History  Problem Relation Age of Onset  . Cancer Mother         ovarian  . Sudden death Mother   . Diabetes Sister   .  Cancer Maternal Grandmother        lung  . Heart disease Maternal Grandmother   . Hypertension Sister   . Hypertension Cousin   . CAD Neg Hx   . Stroke Neg Hx     REVIEW OF SYSTEMS: Reviewed with the patient as per HPI. PSYCH: Patient denies having dental phobia.  VITAL SIGNS: BP 133/61 (BP Location: Left Arm)   Pulse (!) 58   Temp 98.6 F (37 C) (Oral)   Resp 16   Ht 5' 4"  (1.626 m)   Wt 66.7 kg Comment: scale b  SpO2 94%   BMI 25.23 kg/m    PHYSICAL EXAM: >> General:  Well-developed, comfortable and in no apparent distress. >> Neurological:  Alert and oriented to person, place and  time. >> Extraoral:  Facial symmetry present without any edema or erythema.  No swelling or lymphadenopathy.  TMJ asymptomatic without clicks or crepitations. >> Intraoral:  Soft tissues appear well-perfused and mucous membranes moist.  FOM and vestibules soft and not raised. Oral cavity without mass or lesion. No signs of infection, parulis, sinus tract, edema or erythema evident upon exam.   DENTAL EXAM: All clinical findings charted.    >> Dentition:  Overall poor remaining dentition.  Missing teeth, caries, retained root tips, existing restorations. >> The patient is maintaining poor oral hygiene.  >> Periodontal: Inflamed and erythematous gingival tissue. Generalized plaque and calculus accumulation.  Gingival recession, generalized. Teeth numbers 7 and 9 have Class III mobility. >> Caries: #8, #10 and #11 >> Retained Root Tips: #8, #10 and #11 >> Endodontics: #8 has had previous root canal therapy >> Removable/Fixed Prosthodontics: Patient denies wearing partial dentures. She does report interest in having dentures made in the future to replace missing teeth. >> Occlusion: Unable to assess molar occlusion. Limited restorative space of mandible due to supra-eruption of maxillary posterior teeth.  Non-functional and supra-erupted  teeth numbers 2, 3, 4, 5, 6, 12, 13, 14 and 16   RADIOGRAPHIC EXAM:  10/22/20 Orthopanogram interpreted. >> Condyles seated bilaterally in fossas.  No evidence of abnormal pathology.  All visualized osseous structures appear WNL. >> Areas of moderate to severe horizontal bone loss. Retained root tips #8, #10 and #11. #8 previous endodontic therapy. #2, 3, 4, 5, 6, 12, 13, 14 and 16 are supra-erupted. Missing teeth, caries, existing restorations.   ASSESSMENT:  1. Severe mitral regurgitation 2. Preoperative dental consultation 3. Long-term use of anticoagulation (on Eliquis) 4. Missing teeth 5. Caries 6. Retained root tips 7. Accretions on teeth 8. Chronic periodontitis 9. Loose teeth 10. Gingival recession, generalized 11. Postoperative bleeding risk   PLAN AND RECOMMENDATIONS: > I discussed the risks, benefits, and complications of various scenarios with the patient in relationship to their medical and dental conditions, which included systemic infection such as endocarditis, bacteremia or other serious issues that could potentially occur either before, during or after their anticipated surgery if dental/oral concerns are not addressed.  I explained that if any chronic or acute dental/oral infection(s) are addressed and subsequently not maintained following medical optimization and recovery, their risk of the previously mentioned complications are just as high and could potentially occur postoperatively.  I explained all significant findings of the dental consultation with the patient including several teeth that are broken down with deep cavities and loose teeth affected by gum disease and the recommended care including extractions of all chronically infected teeth (#7, #8, #9, #10 and #11) in order to optimize them for heart surgery from  a dental standpoint.  The patient verbalized understanding of all findings, discussion, and recommendations. >> We then discussed various treatment options  to include no treatment, multiple extractions with alveoloplasty, pre-prosthetic surgery as indicated, periodontal therapy, dental restorations, root canal therapy, crown and bridge therapy, implant therapy, and replacement of missing teeth as indicated.  We also discussed the remaining upper teeth and how they are supra-erupted and almost impinging on her lower ridge which would not allow her to have dentures made unless the remaining upper teeth are also extracted at some point.  Explained that these are not infected, but she can consider/think about if she would like to go ahead and have the rest of these teeth out while we are taking out her other teeth or not.  The patient verbalized understanding of all options, and currently wishes to proceed with extractions of teeth numbers 7, 8, 9, 10 and 11 and will consider extractions of remaining maxillary teeth as well (not yet decided). >>>  Plan to discuss all findings and recommendations with medical team and coordinate future care as needed.  Plan to take patient to the OR tomorrow morning (5/5) for extractions and have heparin held 4-6 hours before surgery.  Case was discussed with Dr. Roxy Manns and he is agreeable to the plan.   <> The patient tolerated today's visit well.  All questions and concerns were addressed and answered before conclusion of the consultation.  [place] Benson [place], D.M.D.

## 2020-10-23 NOTE — Progress Notes (Signed)
[place] for heparin Indication: atrial fibrillation  Allergies  Allergen Reactions  . Metronidazole Itching and Rash    Patient Measurements: Height: 5' 4"  (162.6 cm) Weight: 66.7 kg (147 lb) (scale b) IBW/kg (Calculated) : 54.7 Heparin Dosing Weight: 67kg  Vital Signs: Temp: 97.8 F (36.6 C) (05/04 0415) Temp Source: Oral (05/04 0415) BP: 130/67 (05/04 0812) Pulse Rate: 60 (05/04 0812)  Labs: Recent Labs    10/21/20 1454 10/22/20 0345 10/22/20 1341 10/23/20 0006 10/23/20 0648  HGB 12.2 10.0* 11.2* 10.7*  --   HCT 36.3 30.1* 33.6* 31.9*  --   PLT 202 166 188 175  --   APTT  --   --   --  75*  --   HEPARINUNFRC  --   --   --  0.40 0.49  CREATININE 0.86 0.74  --  0.85  --     Estimated Creatinine Clearance: 55.4 mL/min (by C-G formula based on SCr of 0.85 mg/dL).   Medical History: Past Medical History:  Diagnosis Date  . Atrial fibrillation with RVR ([place]) 2014   persistent  . Chronic diastolic CHF (congestive heart failure) (HCC)    HTN  . Chronic diastolic congestive heart failure ([place])   . COVID-19 virus infection 01/20/2019  . ESOPHAGEAL STRICTURE 02/19/2005   Qualifier: Diagnosis of  By: Amil Amen MD, Benjamine Mola    . Goiter   . History of anemia    unclear cause, now resolved  . History of chicken pox   . History of colon polyps    benign  . HLD (hyperlipidemia)   . Hypothyroidism   . Malignant hypertension longstanding  . Microcytosis   . Mitral valve regurgitation   . Osteopenia 12/2013   T -2.3 hip  . Osteoporosis   . Pulmonary hypertension ([place])   . Rheumatoid arthritis ([place]) 11/2015   +RF, +CCP, ESR 49, synovitis on exam and [place] 11/2015 [facility])  . Right lower lobe pneumonia 07/24/2014  . Tricuspid regurgitation   . Vitamin D deficiency      Assessment: 9 yoF admitted with LHC and TEE found to have severe mitral regurgitation with plans for MVR. Pt on apixaban PTA for hx AFib (last dose 4/29), pharmacy  to start IV heparin perioperatively.   Heparin level therapeutic, CBC stable.  Goal of Therapy:  Heparin level 0.3-0.7 units/ml Monitor platelets by anticoagulation protocol: Yes   Plan:  Heparin 950 units/h  Daily heparin level and CBC   Joanna Hood, PharmD, [place], [facility] Clinical Pharmacist [phone] Please check AMION for all [facility] Pharmacy numbers 10/23/2020

## 2020-10-24 ENCOUNTER — Encounter (HOSPITAL_COMMUNITY): Payer: Self-pay | Admitting: Cardiovascular Disease

## 2020-10-24 ENCOUNTER — Encounter (HOSPITAL_COMMUNITY)
Admission: RE | Disposition: A | Discharge status: Home / Self Care | Payer: Self-pay | Source: Home / Self Care | Attending: Thoracic Surgery (Cardiothoracic Vascular Surgery)

## 2020-10-24 ENCOUNTER — Inpatient Hospital Stay (HOSPITAL_COMMUNITY): Payer: Medicare HMO | Admitting: Certified Registered Nurse Anesthetist

## 2020-10-24 DIAGNOSIS — I5033 Acute on chronic diastolic (congestive) heart failure: Secondary | ICD-10-CM | POA: Diagnosis not present

## 2020-10-24 DIAGNOSIS — I340 Nonrheumatic mitral (valve) insufficiency: Secondary | ICD-10-CM | POA: Diagnosis not present

## 2020-10-24 DIAGNOSIS — I272 Pulmonary hypertension, unspecified: Secondary | ICD-10-CM | POA: Diagnosis not present

## 2020-10-24 DIAGNOSIS — I4821 Permanent atrial fibrillation: Secondary | ICD-10-CM | POA: Diagnosis not present

## 2020-10-24 DIAGNOSIS — I051 Rheumatic mitral insufficiency: Secondary | ICD-10-CM | POA: Diagnosis not present

## 2020-10-24 HISTORY — PX: MULTIPLE EXTRACTIONS WITH ALVEOLOPLASTY: SHX5342

## 2020-10-24 LAB — CBC
HCT: 30.6 % — ABNORMAL LOW (ref 36.0–46.0)
Hemoglobin: 10.3 g/dL — ABNORMAL LOW (ref 12.0–15.0)
MCH: 27.1 pg (ref 26.0–34.0)
MCHC: 33.7 g/dL (ref 30.0–36.0)
MCV: 80.5 fL (ref 80.0–100.0)
Platelets: 165 10*3/uL (ref 150–400)
RBC: 3.8 MIL/uL — ABNORMAL LOW (ref 3.87–5.11)
RDW: 15 % (ref 11.5–15.5)
WBC: 6 10*3/uL (ref 4.0–10.5)
nRBC: 0 % (ref 0.0–0.2)

## 2020-10-24 LAB — HEPARIN LEVEL (UNFRACTIONATED): Heparin Unfractionated: 0.11 IU/mL — ABNORMAL LOW (ref 0.30–0.70)

## 2020-10-24 LAB — BASIC METABOLIC PANEL
Anion gap: 7 (ref 5–15)
BUN: 8 mg/dL (ref 8–23)
CO2: 23 mmol/L (ref 22–32)
Calcium: 8.7 mg/dL — ABNORMAL LOW (ref 8.9–10.3)
Chloride: 104 mmol/L (ref 98–111)
Creatinine, Ser: 0.9 mg/dL (ref 0.44–1.00)
GFR, Estimated: >60 mL/min (ref >60)
Glucose, Bld: 93 mg/dL (ref 70–99)
Potassium: 4.5 mmol/L (ref 3.5–5.1)
Sodium: 134 mmol/L — ABNORMAL LOW (ref 135–145)

## 2020-10-24 LAB — MAGNESIUM: Magnesium: 2.4 mg/dL (ref 1.7–2.4)

## 2020-10-24 SURGERY — MULTIPLE EXTRACTION WITH ALVEOLOPLASTY
Anesthesia: General | Site: Mouth

## 2020-10-24 MED ORDER — PROPOFOL 10 MG/ML IV BOLUS
INTRAVENOUS | Status: AC
  Filled 2020-10-24: qty 20

## 2020-10-24 MED ORDER — BUPIVACAINE-EPINEPHRINE (PF) 0.5% -1:200000 IJ SOLN
INTRAMUSCULAR | Status: AC
  Filled 2020-10-24: qty 3.6

## 2020-10-24 MED ORDER — ADULT MULTIVITAMIN W/MINERALS CH
1.0000 | ORAL_TABLET | Freq: Every day | ORAL | Status: DC
  Administered 2020-10-24 – 2020-10-27 (×4): 1 via ORAL
  Filled 2020-10-24 (×4): qty 1

## 2020-10-24 MED ORDER — LACTATED RINGERS IV SOLN: INTRAVENOUS | Status: DC

## 2020-10-24 MED ORDER — SODIUM CHLORIDE 0.9 % IV SOLN: 250.0000 mL | INTRAVENOUS | Status: DC | PRN

## 2020-10-24 MED ORDER — BOOST / RESOURCE BREEZE PO LIQD CUSTOM
1.0000 | Freq: Three times a day (TID) | ORAL | Status: DC
  Administered 2020-10-24 – 2020-10-27 (×5): 1 via ORAL

## 2020-10-24 MED ORDER — FENTANYL CITRATE (PF) 250 MCG/5ML IJ SOLN
INTRAMUSCULAR | Status: AC
  Filled 2020-10-24: qty 5

## 2020-10-24 MED ORDER — LIDOCAINE 2% (20 MG/ML) 5 ML SYRINGE
INTRAMUSCULAR | Status: DC | PRN
  Administered 2020-10-24: 20 mg via INTRAVENOUS

## 2020-10-24 MED ORDER — LIDOCAINE-EPINEPHRINE 2 %-1:100000 IJ SOLN
INTRAMUSCULAR | Status: AC
  Filled 2020-10-24: qty 3.4

## 2020-10-24 MED ORDER — OXYCODONE HCL 5 MG PO TABS: 5.0000 mg | ORAL_TABLET | Freq: Once | ORAL | Status: DC | PRN

## 2020-10-24 MED ORDER — LIDOCAINE-EPINEPHRINE 2 %-1:100000 IJ SOLN
INTRAMUSCULAR | Status: DC | PRN
  Administered 2020-10-24 (×2): 1.7 mL via INTRADERMAL

## 2020-10-24 MED ORDER — SODIUM CHLORIDE 0.9% FLUSH
3.0000 mL | Freq: Two times a day (BID) | INTRAVENOUS | Status: DC
  Administered 2020-10-24 – 2020-10-26 (×3): 3 mL via INTRAVENOUS

## 2020-10-24 MED ORDER — HEMOSTATIC AGENTS (NO CHARGE) OPTIME
TOPICAL | Status: DC | PRN
  Administered 2020-10-24: 1 via TOPICAL

## 2020-10-24 MED ORDER — ORAL CARE MOUTH RINSE: 15.0000 mL | Freq: Once | OROMUCOSAL | Status: AC

## 2020-10-24 MED ORDER — FENTANYL CITRATE (PF) 100 MCG/2ML IJ SOLN: 25.0000 ug | INTRAMUSCULAR | Status: DC | PRN

## 2020-10-24 MED ORDER — OXYMETAZOLINE HCL 0.05 % NA SOLN
NASAL | Status: DC | PRN
  Administered 2020-10-24: 1

## 2020-10-24 MED ORDER — ASPIRIN 81 MG PO CHEW
81.0000 mg | CHEWABLE_TABLET | ORAL | Status: AC
  Administered 2020-10-25: 81 mg via ORAL
  Filled 2020-10-24: qty 1

## 2020-10-24 MED ORDER — HEPARIN (PORCINE) 25000 UT/250ML-% IV SOLN
950.0000 [IU]/h | INTRAVENOUS | Status: DC
  Administered 2020-10-24: 950 [IU]/h via INTRAVENOUS
  Filled 2020-10-24: qty 250

## 2020-10-24 MED ORDER — ROCURONIUM BROMIDE 10 MG/ML (PF) SYRINGE
PREFILLED_SYRINGE | INTRAVENOUS | Status: DC | PRN
  Administered 2020-10-24: 50 mg via INTRAVENOUS

## 2020-10-24 MED ORDER — SODIUM CHLORIDE 0.9% FLUSH: 3.0000 mL | INTRAVENOUS | Status: DC | PRN

## 2020-10-24 MED ORDER — OXYCODONE HCL 5 MG/5ML PO SOLN: 5.0000 mg | Freq: Once | ORAL | Status: DC | PRN

## 2020-10-24 MED ORDER — DEXAMETHASONE SODIUM PHOSPHATE 10 MG/ML IJ SOLN
INTRAMUSCULAR | Status: DC | PRN
  Administered 2020-10-24: 5 mg via INTRAVENOUS

## 2020-10-24 MED ORDER — 0.9 % SODIUM CHLORIDE (POUR BTL) OPTIME
TOPICAL | Status: DC | PRN
  Administered 2020-10-24: 1000 mL

## 2020-10-24 MED ORDER — OXYMETAZOLINE HCL 0.05 % NA SOLN
NASAL | Status: AC
  Filled 2020-10-24: qty 30

## 2020-10-24 MED ORDER — ONDANSETRON HCL 4 MG/2ML IJ SOLN: 4.0000 mg | Freq: Once | INTRAMUSCULAR | Status: DC | PRN

## 2020-10-24 MED ORDER — PROPOFOL 10 MG/ML IV BOLUS
INTRAVENOUS | Status: DC | PRN
  Administered 2020-10-24: 100 mg via INTRAVENOUS

## 2020-10-24 MED ORDER — SODIUM CHLORIDE 0.9 % IV SOLN: INTRAVENOUS | Status: DC

## 2020-10-24 MED ORDER — SUGAMMADEX SODIUM 200 MG/2ML IV SOLN
INTRAVENOUS | Status: DC | PRN
  Administered 2020-10-24: 140 mg via INTRAVENOUS

## 2020-10-24 MED ORDER — CHLORHEXIDINE GLUCONATE 0.12 % MT SOLN: 15.0000 mL | Freq: Once | OROMUCOSAL | Status: AC

## 2020-10-24 MED ORDER — OXYMETAZOLINE HCL 0.05 % NA SOLN
NASAL | Status: DC | PRN
  Administered 2020-10-24 (×2): 2 via NASAL

## 2020-10-24 MED ORDER — FENTANYL CITRATE (PF) 250 MCG/5ML IJ SOLN
INTRAMUSCULAR | Status: DC | PRN
  Administered 2020-10-24: 100 ug via INTRAVENOUS
  Administered 2020-10-24: 25 ug via INTRAVENOUS

## 2020-10-24 MED ORDER — ONDANSETRON HCL 4 MG/2ML IJ SOLN
INTRAMUSCULAR | Status: DC | PRN
  Administered 2020-10-24: 4 mg via INTRAVENOUS

## 2020-10-24 MED ORDER — TORSEMIDE 20 MG PO TABS
40.0000 mg | ORAL_TABLET | Freq: Every day | ORAL | Status: DC
  Administered 2020-10-24 – 2020-10-27 (×4): 40 mg via ORAL
  Filled 2020-10-24 (×4): qty 2

## 2020-10-24 MED ORDER — CHLORHEXIDINE GLUCONATE 0.12 % MT SOLN
OROMUCOSAL | Status: AC
  Administered 2020-10-24: 15 mL via OROMUCOSAL
  Filled 2020-10-24: qty 15

## 2020-10-24 SURGICAL SUPPLY — 32 items
ALCOHOL 70% 16 OZ (MISCELLANEOUS) ×2 IMPLANT
BLADE SURG 15 STRL LF DISP TIS (BLADE) ×1 IMPLANT
BLADE SURG 15 STRL SS (BLADE) ×2
COVER SURGICAL LIGHT HANDLE (MISCELLANEOUS) ×2 IMPLANT
COVER WAND RF STERILE (DRAPES) ×1 IMPLANT
GAUZE 4X4 16PLY RFD (DISPOSABLE) ×1 IMPLANT
GAUZE PACKING FOLDED 2  STR (GAUZE/BANDAGES/DRESSINGS) ×2
GAUZE PACKING FOLDED 2 STR (GAUZE/BANDAGES/DRESSINGS) ×1 IMPLANT
GLOVE BIO SURGEON STRL SZ 6.5 (GLOVE) ×2 IMPLANT
GLOVE SURG SS PI 6.0 STRL IVOR (GLOVE) ×2 IMPLANT
GOWN STRL REUS W/ TWL LRG LVL3 (GOWN DISPOSABLE) ×2 IMPLANT
GOWN STRL REUS W/TWL LRG LVL3 (GOWN DISPOSABLE) ×4
KIT BASIN OR (CUSTOM PROCEDURE TRAY) ×2 IMPLANT
KIT TURNOVER KIT B (KITS) ×2 IMPLANT
MANIFOLD NEPTUNE II (INSTRUMENTS) ×2 IMPLANT
NS IRRIG 1000ML POUR BTL (IV SOLUTION) ×2 IMPLANT
PACK EENT II TURBAN DRAPE (CUSTOM PROCEDURE TRAY) ×2 IMPLANT
PAD ARMBOARD 7.5X6 YLW CONV (MISCELLANEOUS) ×2 IMPLANT
SPONGE SURGIFOAM ABS GEL 100 (HEMOSTASIS) IMPLANT
SPONGE SURGIFOAM ABS GEL 12-7 (HEMOSTASIS) IMPLANT
SPONGE SURGIFOAM ABS GEL SZ50 (HEMOSTASIS) IMPLANT
SUCTION FRAZIER HANDLE 10FR (MISCELLANEOUS) ×2
SUCTION TUBE FRAZIER 10FR DISP (MISCELLANEOUS) ×1 IMPLANT
SUT CHROMIC 3 0 PS 2 (SUTURE) ×4 IMPLANT
SUT CHROMIC 4 0 P 3 18 (SUTURE) ×1 IMPLANT
SYR 50ML SLIP (SYRINGE) ×2 IMPLANT
SYR BULB IRRIG 60ML STRL (SYRINGE) ×2 IMPLANT
TOWEL GREEN STERILE FF (TOWEL DISPOSABLE) ×2 IMPLANT
TUBE CONNECTING 12X1/4 (SUCTIONS) ×2 IMPLANT
WATER STERILE IRR 1000ML POUR (IV SOLUTION) ×1 IMPLANT
WATER TABLETS ICX (MISCELLANEOUS) ×1 IMPLANT
YANKAUER SUCT BULB TIP NO VENT (SUCTIONS) ×2 IMPLANT

## 2020-10-24 NOTE — H&P (View-Only) (Signed)
      [address]       [place] [address]             [phone]     CARDIOTHORACIC SURGERY PROGRESS NOTE  Day of Surgery  S/P Procedure(s) (LRB): MULTIPLE EXTRACTION WITH ALVEOLOPLASTY (N/A)  Subjective: Feels well.  Denies SOB  Objective: Vital signs in last 24 hours: Temp:  [98.1 F (36.7 C)-98.6 F (37 C)] 98.6 F (37 C) (05/05 0400) Pulse Rate:  [58-71] 61 (05/05 0400) Cardiac Rhythm: Atrial fibrillation (05/04 2055) Resp:  [16-19] 18 (05/05 0400) BP: (122-150)/(41-61) 122/41 (05/05 0400) SpO2:  [92 %-94 %] 92 % (05/05 0400) Weight:  [68.4 kg] 68.4 kg (05/05 0400)  Physical Exam:  Rhythm:   AFib  Breath sounds: clear  Heart sounds:  irregular  Incisions:  n/a  Abdomen:  soft  Extremities:  Warm, no edema   Intake/Output from previous day: 05/04 0701 - 05/05 0700 In: 1274.8 [P.O.:1080; I.V.:94.8; IV Piggyback:100] Out: 1575 [Urine:1575] Intake/Output this shift: No intake/output data recorded.  Lab Results: Recent Labs    10/23/20 0006 10/24/20 0258  WBC 6.6 6.0  HGB 10.7* 10.3*  HCT 31.9* 30.6*  PLT 175 165   BMET:  Recent Labs    10/23/20 0006 10/23/20 1134 10/24/20 0258  NA 136  --  134*  K 2.8* 3.2* 4.5  CL 101  --  104  CO2 26  --  23  GLUCOSE 94  --  93  BUN 11  --  8  CREATININE 0.85  --  0.90  CALCIUM 8.6*  --  8.7*    CBG (last 3)  No results for input(s): GLUCAP in the last 72 hours. PT/INR:  No results for input(s): LABPROT, INR in the last 72 hours.  CXR:  N/A  Assessment/Plan:  For dental extraction today Discussed plan for definitive surgical intervention w/ Dr [name] Tentatively plan MVR +/- TVR next Monday   Rexene Alberts, MD 10/24/2020 8:25 AM

## 2020-10-24 NOTE — Anesthesia Procedure Notes (Signed)
Procedure Name: Intubation Date/Time: 10/24/2020 11:05 AM Performed by: Dorthea Cove, CRNA Pre-anesthesia Checklist: Patient identified, Emergency Drugs available, Suction available and Patient being monitored Patient Re-evaluated:Patient Re-evaluated prior to induction Oxygen Delivery Method: Circle system utilized Preoxygenation: Pre-oxygenation with 100% oxygen Induction Type: IV induction Ventilation: Mask ventilation without difficulty Laryngoscope Size: Mac and 3 Grade View: Grade I Tube type: Oral Tube size: 7.0 mm Number of attempts: 1 Airway Equipment and Method: Stylet and Oral airway Placement Confirmation: ETT inserted through vocal cords under direct vision,  positive ETCO2 and breath sounds checked- equal and bilateral Secured at: 21 cm Tube secured with: Tape Dental Injury: Teeth and Oropharynx as per pre-operative assessment  Comments: Nasal airway attempted unsuccessfully therefore, nasal ett was aborted. Oral ETT positioned easily.

## 2020-10-24 NOTE — Progress Notes (Signed)
[place] for heparin Indication: atrial fibrillation  Allergies  Allergen Reactions  . Metronidazole Itching and Rash    Patient Measurements: Height: 5' 4"  (162.6 cm) Weight: 68.4 kg (150 lb 11.2 oz) IBW/kg (Calculated) : 54.7 Heparin Dosing Weight: 67kg  Vital Signs: Temp: 98.4 F (36.9 C) (05/05 1308) Temp Source: Oral (05/05 1308) BP: 144/67 (05/05 1308) Pulse Rate: 66 (05/05 1308)  Labs: Recent Labs    10/22/20 0345 10/22/20 1341 10/23/20 0006 10/23/20 0648 10/24/20 0258  HGB 10.0* 11.2* 10.7*  --  10.3*  HCT 30.1* 33.6* 31.9*  --  30.6*  PLT 166 188 175  --  165  APTT  --   --  75*  --   --   HEPARINUNFRC  --   --  0.40 0.49 0.11*  CREATININE 0.74  --  0.85  --  0.90    Estimated Creatinine Clearance: 52.9 mL/min (by C-G formula based on SCr of 0.9 mg/dL).   Medical History: Past Medical History:  Diagnosis Date  . Atrial fibrillation with RVR ([place]) 2014   persistent  . Chronic diastolic CHF (congestive heart failure) (HCC)    HTN  . Chronic diastolic congestive heart failure ([place])   . COVID-19 virus infection 01/20/2019  . ESOPHAGEAL STRICTURE 02/19/2005   Qualifier: Diagnosis of  By: Amil Amen MD, Benjamine Mola    . Goiter   . History of anemia    unclear cause, now resolved  . History of chicken pox   . History of colon polyps    benign  . HLD (hyperlipidemia)   . Hypothyroidism   . Malignant hypertension longstanding  . Microcytosis   . Mitral valve regurgitation   . Osteopenia 12/2013   T -2.3 hip  . Osteoporosis   . Pulmonary hypertension ([place])   . Rheumatoid arthritis ([place]) 11/2015   +RF, +CCP, ESR 49, synovitis on exam and [place] 11/2015 [facility])  . Right lower lobe pneumonia 07/24/2014  . Tricuspid regurgitation   . Vitamin D deficiency      Assessment: 60 yoF admitted with LHC and TEE found to have severe mitral regurgitation with plans for MVR. Pt on apixaban PTA for hx AFib (last dose 4/29), pharmacy  to start IV heparin perioperatively.   Heparin off this morning for dental extractions, ok per DDS to resume 8h postop.  Goal of Therapy:  Heparin level 0.3-0.7 units/ml Monitor platelets by anticoagulation protocol: Yes   Plan:  Restart heparin 950 units/h no bolus at 2030 tonight Recheck heparin level with morning labs   Arrie Senate, PharmD, BCPS, [facility] Clinical Pharmacist [phone] Please check AMION for all [place] numbers 10/24/2020

## 2020-10-24 NOTE — Anesthesia Postprocedure Evaluation (Signed)
Anesthesia Post Note  Patient: Joanna Hood  Procedure(s) Performed: MULTIPLE EXTRACTION WITH ALVEOLOPLASTY (N/A Mouth)     Patient location during evaluation: PACU Anesthesia Type: General Level of consciousness: awake and alert Pain management: pain level controlled Vital Signs Assessment: post-procedure vital signs reviewed and stable Respiratory status: spontaneous breathing, nonlabored ventilation, respiratory function stable and patient connected to nasal cannula oxygen Cardiovascular status: blood pressure returned to baseline and stable Postop Assessment: no apparent nausea or vomiting Anesthetic complications: no   No complications documented.  Last Vitals:  Vitals:   10/24/20 1308 10/24/20 1345  BP: (!) 144/67 137/68  Pulse: 66 64  Resp: 20 20  Temp: 36.9 C   SpO2: 99% 96%    Last Pain:  Vitals:   10/24/20 1425  TempSrc:   PainSc: 5                  Mashanda Ishibashi COKER

## 2020-10-24 NOTE — Op Note (Signed)
Department of Dental Medicine    OPERATIVE REPORT  DATE OF SURGERY:  10/24/2020  PATIENT NAME:   Joanna Hood DATE OF BIRTH:   12-Apr-1946 MEDICAL RECORD NUMBER: [id number]  SURGEON:  Rutha Melgoza B. Benson Norway, D.M.D.  ASSISTANT:  Molli Posey, DAII  PREOPERATIVE DIAGNOSES:  Dental caries, periodontal disease Patient Active Problem List   Diagnosis Date Noted  . Encounter for preoperative dental examination   . Poor dentition   . Dental caries   . Retained dental root   . Chronic periodontitis   . Loose, teeth   . Pulmonary hypertension ([place])   . Chronic diastolic congestive heart failure ([place])   . Tricuspid regurgitation   . Dyspnea 10/21/2020  . Unintentional weight loss 09/17/2020  . Renal insufficiency 09/17/2020  . Cough 06/28/2020  . Grade III diastolic dysfunction 02/54/2706  . Mitral valve regurgitation   . Rheumatoid arthritis ([place]) 12/16/2015  . Health maintenance examination 04/23/2015  . Advanced care planning/counseling discussion 04/23/2015  . Medicare annual wellness visit, subsequent 11/03/2013  . Vitamin D deficiency 11/03/2013  . Microcytic anemia 09/02/2013  . Persistent atrial fibrillation ([place]) 04/29/2013  . HTN (hypertension), malignant 04/29/2013  . Osteopenia 01/01/2010  . POSTMENOPAUSAL STATUS 12/03/2009  . SNORING 02/08/2008  . GOITER, MULTINODULAR 02/16/2007  . Hypothyroidism 02/16/2007  . 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 History of lung abscess 05/23/1999    POSTOPERATIVE DIAGNOSES: Same  PROCEDURES PERFORMED: 1. Extractions of teeth numbers 3, 4, 7, 8, 9, 10, 11 and 14 2. 2 Quadrants of alveoloplasty (UR & UL)  ANESTHESIA: General anesthesia via endotracheal tube.  MEDICATIONS: 1. Ancef 2 g IV prior to invasive dental procedures. 2. Local anesthesia with a total utilization of 2 cartridges of 34 mg of lidocaine with 0.018 mg of epinephrine/ea.  SPECIMENS: 8 teeth that were extracted and  discarded.  DRAINS/CULTURES: None  COMPLICATIONS: None  ESTIMATED BLOOD LOSS: 5 mL  INTRAVENOUS FLUIDS: 10 mL of Lactated ringers solution.  INDICATIONS: The patient was recently diagnosed with severe mitral regurgitation.  A medically necessary dental consult was then requested to evaluate the patient for any dental/orofacial infection and their overall oral health.  The patient was examined and subsequently treatment planned for multiple extractions of infected teeth and teeth with poor periodontal prognosis/hopeless periodontal prognosis.  This treatment plan was made to decrease the perioperative and postoperative risks and complications associated with dental/orofacial infection from affecting the patient's systemic health.  OPERATIVE FINDINGS:  The patient was examined in operating room number 7.  The indicated teeth were identified and verified for extraction. The patient was noted be affected by severe dental decay, chronic periodontitis and chronic apical periodontitis.  DESCRIPTION OF PROCEDURE: The patient was identified in the holding area and brought to the main operating room number 7 by the anesthesia team. The patient was then placed in the supine position on the operating table.  General anesthesia was then induced per the anesthesia team. The patient was then prepped and draped in the usual sterile fashion for dental medicine procedures.  A timeout was performed. The patient was identified and procedures were verified. A throat pack was placed at this time. The oral cavity was then thoroughly examined with the findings noted above. The patient was then ready for the dental medicine procedure as follows:  ROUTINE EXTRACTIONS: Local anesthesia was administered sequentially with a total utilization of 2 cartridges each containing 34 mg of lidocaine with 0.018 mg of epinephrine. Location of anesthesia included upper right and  upper left quadrants infiltration, palatal and nasopalatine  block.  The maxillary left and right quadrants were first approached. The teeth were then subluxated with a series of straight elevators. Tooth numbers 3, 4, 7, 8, 9, 10, 11 and 14 were then removed with a 150 forceps without complications. Alveoloplasty was then performed utilizing a ronguers and bone file. The surgical sites were then irrigated with copious amounts of sterile saline. The tissues were approximated and trimmed appropriately. Surgi Foam was placed in the extraction sites. The surgical sites were closed using 3-0 chromic gut sutures as follows: 3 simple interrupted and 2 figure-8 style sutures to approximate papilla and allow for primary tissue closure.  END OF PROCEDURE: Thorough oral irrigation with sterile saline was performed. The patient was examined for complications, and seeing none, the dental medicine procedure was deemed to be complete. The throat pack was removed at this time. An oral airway was then placed at the request of the anesthesia team. A series of 4 x 4 gauze were placed in the mouth to aid hemostasis as needed. The patient was then handed over to the anesthesia team for final disposition. After an appropriate amount of time, the patient was extubated and taken to the postanesthsia care unit in stable condition. All counts were correct for the dental medicine procedure.   [place] Benson Norway, D.M.D.

## 2020-10-24 NOTE — Progress Notes (Signed)
      [address]       [place] [address]             [phone]     CARDIOTHORACIC SURGERY PROGRESS NOTE  Day of Surgery  S/P Procedure(s) (LRB): MULTIPLE EXTRACTION WITH ALVEOLOPLASTY (N/A)  Subjective: Feels well.  Denies SOB  Objective: Vital signs in last 24 hours: Temp:  [98.1 F (36.7 C)-98.6 F (37 C)] 98.6 F (37 C) (05/05 0400) Pulse Rate:  [58-71] 61 (05/05 0400) Cardiac Rhythm: Atrial fibrillation (05/04 2055) Resp:  [16-19] 18 (05/05 0400) BP: (122-150)/(41-61) 122/41 (05/05 0400) SpO2:  [92 %-94 %] 92 % (05/05 0400) Weight:  [68.4 kg] 68.4 kg (05/05 0400)  Physical Exam:  Rhythm:   AFib  Breath sounds: clear  Heart sounds:  irregular  Incisions:  n/a  Abdomen:  soft  Extremities:  Warm, no edema   Intake/Output from previous day: 05/04 0701 - 05/05 0700 In: 1274.8 [P.O.:1080; I.V.:94.8; IV Piggyback:100] Out: 1575 [Urine:1575] Intake/Output this shift: No intake/output data recorded.  Lab Results: Recent Labs    10/23/20 0006 10/24/20 0258  WBC 6.6 6.0  HGB 10.7* 10.3*  HCT 31.9* 30.6*  PLT 175 165   BMET:  Recent Labs    10/23/20 0006 10/23/20 1134 10/24/20 0258  NA 136  --  134*  K 2.8* 3.2* 4.5  CL 101  --  104  CO2 26  --  23  GLUCOSE 94  --  93  BUN 11  --  8  CREATININE 0.85  --  0.90  CALCIUM 8.6*  --  8.7*    CBG (last 3)  No results for input(s): GLUCAP in the last 72 hours. PT/INR:  No results for input(s): LABPROT, INR in the last 72 hours.  CXR:  N/A  Assessment/Plan:  For dental extraction today Discussed plan for definitive surgical intervention w/ Dr Bensimhon Tentatively plan MVR +/- TVR next Monday   Joanna Hood H Elnor Renovato, MD 10/24/2020 8:25 AM  

## 2020-10-24 NOTE — Interval H&P Note (Signed)
History and Physical Interval Note:  10/24/2020 10:28 AM  Joanna Hood  has presented today for surgery, with the diagnosis of dental carries.  The various methods of treatment have been discussed with the patient and family. After consideration of risks, benefits and other options for treatment, the patient has consented to  Procedure(s): MULTIPLE EXTRACTION WITH ALVEOLOPLASTY (N/A) as a surgical intervention.  The patient's history has been reviewed, patient examined, no change in status, stable for surgery.  I have reviewed the patient's chart and labs.  Questions were answered to the patient's satisfaction.     Charlaine Dalton

## 2020-10-24 NOTE — Anesthesia Preprocedure Evaluation (Signed)
Anesthesia Evaluation  Patient identified by MRN, date of birth, ID band Patient awake    Reviewed: Allergy & Precautions, NPO status , Patient's Chart, lab work & pertinent test results  Airway Mallampati: II  TM Distance: >3 FB Neck ROM: Full    Dental  (+) Poor Dentition, Dental Advisory Given,    Pulmonary former smoker,    breath sounds clear to auscultation       Cardiovascular hypertension,  Rhythm:Irregular Rate:Normal     Neuro/Psych    GI/Hepatic   Endo/Other    Renal/GU      Musculoskeletal   Abdominal   Peds  Hematology   Anesthesia Other Findings   Reproductive/Obstetrics                             Anesthesia Physical Anesthesia Plan  ASA: III  Anesthesia Plan: General   Post-op Pain Management:    Induction: Intravenous  PONV Risk Score and Plan: Ondansetron and Dexamethasone  Airway Management Planned: Nasal ETT  Additional Equipment:   Intra-op Plan:   Post-operative Plan: Extubation in OR  Informed Consent: I have reviewed the patients History and Physical, chart, labs and discussed the procedure including the risks, benefits and alternatives for the proposed anesthesia with the patient or authorized representative who has indicated his/her understanding and acceptance.     Dental advisory given  Plan Discussed with: CRNA and Anesthesiologist  Anesthesia Plan Comments:         Anesthesia Quick Evaluation

## 2020-10-24 NOTE — Progress Notes (Signed)
Advanced Heart Failure Rounding Note  PCP-Cardiologist: Dr. Gwenlyn Found AHF: Dr. [name]   Patient Profile   [name], followed by Dr. Gwenlyn Found, w/ h/o chronic diastolic heart failure w/ predominant RV dysfunction + pulmonary hypertension, chronic atrial fibrillation on Eliquis, severe mitral regurgitation, systemic HTN and rheumatoid arthritis on Methotrexate, being evaluated for mitral valve repair vs replacement.   Subjective:    Underwent upper teeth extraction today and was uneventful.  Denies SOB, orthopnea or PND. Rhythm stable. Heparin on hold.   Objective:   Weight Range: 68.4 kg Body mass index is 25.87 kg/m.   Vital Signs:   Temp:  [97 F (36.1 C)-98.6 F (37 C)] 98.4 F (36.9 C) (05/05 1308) Pulse Rate:  [41-74] 66 (05/05 1308) Resp:  [17-20] 20 (05/05 1308) BP: (114-154)/(41-72) 144/67 (05/05 1308) SpO2:  [92 %-100 %] 99 % (05/05 1308) Weight:  [68.4 kg] 68.4 kg (05/05 0400) Last BM Date: 10/23/20  Weight change: Filed Weights   10/22/20 0406 10/23/20 0138 10/24/20 0400  Weight: 67.7 kg 66.7 kg 68.4 kg    Intake/Output:   Intake/Output Summary (Last 24 hours) at 10/24/2020 1334 Last data filed at 10/24/2020 1210 Gross per 24 hour  Intake 1386.17 ml  Output 1050 ml  Net 336.17 ml      Physical Exam     General:  Sitting up in bed No resp difficulty HEENT: normal + ice packs. Upper teeth removed Neck: supple. no JVD. Carotids 2+ bilat; no bruits. No lymphadenopathy or thryomegaly appreciated. Cor: PMI nondisplaced. Irregular rate & rhythm. 2/6 MR Lungs: clear Abdomen: soft, nontender, nondistended. No hepatosplenomegaly. No bruits or masses. Good bowel sounds. Extremities: no cyanosis, clubbing, rash, edema Neuro: alert & orientedx3, cranial nerves grossly intact. moves all 4 extremities w/o difficulty. Affect pleasant    Telemetry   Chronic afib, 60-70 Personally reviewed   Labs    CBC Recent Labs    10/23/20 0006 10/24/20 0258  WBC  6.6 6.0  HGB 10.7* 10.3*  HCT 31.9* 30.6*  MCV 80.2 80.5  PLT 175 119   Basic Metabolic Panel Recent Labs    10/23/20 0006 10/23/20 1134 10/24/20 0258  NA 136  --  134*  K 2.8* 3.2* 4.5  CL 101  --  104  CO2 26  --  23  GLUCOSE 94  --  93  BUN 11  --  8  CREATININE 0.85  --  0.90  CALCIUM 8.6*  --  8.7*  MG  --  1.4* 2.4   Liver Function Tests Recent Labs    10/23/20 0006  AST 25  ALT 26  ALKPHOS 68  BILITOT 1.8*  PROT 6.7  ALBUMIN 3.1*   No results for input(s): LIPASE, AMYLASE in the last 72 hours. Cardiac Enzymes No results for input(s): CKTOTAL, CKMB, CKMBINDEX, TROPONINI in the last 72 hours.  BNP: BNP (last 3 results) Recent Labs    10/23/20 0006  BNP 490.4*    ProBNP (last 3 results) No results for input(s): PROBNP in the last 8760 hours.   D-Dimer No results for input(s): DDIMER in the last 72 hours. Hemoglobin A1C No results for input(s): HGBA1C in the last 72 hours. Fasting Lipid Panel No results for input(s): CHOL, HDL, LDLCALC, TRIG, CHOLHDL, LDLDIRECT in the last 72 hours. Thyroid Function Tests No results for input(s): TSH, T4TOTAL, T3FREE, THYROIDAB in the last 72 hours.  Invalid input(s): FREET3  Other results:   Imaging    No results found.  Medications:     Scheduled Medications: . amLODipine  10 mg Oral Daily  . aspirin EC  81 mg Oral Once  . calcium-vitamin D  1 tablet Oral Daily  . cholecalciferol  1,000 Units Oral Daily  . docusate sodium  100 mg Oral Daily  . levothyroxine  125 mcg Oral Q0600  . metoprolol tartrate  37.5 mg Oral BID  . potassium chloride SA  20 mEq Oral Daily  . ramipril  10 mg Oral BID  . sodium chloride flush  3 mL Intravenous Q12H  . sodium chloride flush  3 mL Intravenous Q12H    Infusions: . sodium chloride    . heparin    . lactated ringers      PRN Medications: sodium chloride, acetaminophen, morphine injection, ondansetron (ZOFRAN) IV, sodium chloride  flush    Assessment/Plan   1. Severe Rheumatic Mitral Valve Regurgitation  - Primary MR. LVEF normal 60-65%  - TEE this admit confirmed severe MR. LHC w/ normal coronaries - Not candidate for MitraClip per Dr. Burt Knack  - CT surgery consulted for potential minimally invasive mitral valve repair/replacement - s/p teeth extraction today 5/5 - I spoke to Dr. Roxy Manns and will tentatively plan MVR +/- TVR for Monday. Will repeat RHC tomorrow to make sure optimized prior to surgery  2. Severe Pulmonary Hypertension  - TTE 8/21 w/ severely elevated RVSP, 78 mmHg. 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 WHO Group 1 2/2 CTD (RA)   3. Acute on Chronic Diastolic Heart Failure w/ Predominant RV Dysfunction  - 2D Echo 8/21 showed normal LVEF, 60-65%, w/ mild LVH and G3DD c/w restrictive physiology. RV was mildly reduced at the time - TEE 10/21/20 LVEF normal. RV systolic function is now moderately reduced w/ mild-mod TR, in setting of progressive/ now severe MR. - RHC this admit; RAP 16, LVEDP 16. Cardiac output- 4.9 L/min with an index of 2.8 L/min/m. - Volume status improved w/ diuresis. Switch back to po diuretics.  - RHC tomorrow - Continue ? blocker and home antihypertensives for BP/HR control   4. Chronic Afib: in setting of severe MR w/ severe LAE. - rate controlled w/ metoprolol  - Eliquis currently on hold. Covering w/ heparin until invasive procedures are completed - restart heparin later this afternoon. Discussed dosing with PharmD personally.  5. Rheumatoid Arthritis - on MTX - followed by rheumatology   6. Hyponatremia - K 4.5 today    Length of Stay: 1  Glori Bickers, MD  10/24/2020, 1:34 PM  Advanced Heart Failure Team Pager [phone] (M-F; 7a - 5p)  Please contact [facility] for  night-coverage after hours (5p -7a ) and weekends on [email or web address]

## 2020-10-24 NOTE — Progress Notes (Addendum)
Initial Nutrition Assessment  DOCUMENTATION CODES:   Not applicable  INTERVENTION:   -RD will follow for diet advancement and adjust supplement regimen as appropriate -MVI with minerals daily -Boost Breeze po TID, each supplement provides 250 kcal and 9 grams of protein  NUTRITION DIAGNOSIS:   Increased nutrient needs related to post-op healing as evidenced by estimated needs.  GOAL:   Patient will meet greater than or equal to 90% of their needs  MONITOR:   PO intake,Supplement acceptance,Diet advancement,Labs,Weight trends,Skin,I & O's  REASON FOR ASSESSMENT:   Consult Assessment of nutrition requirement/status  ASSESSMENT:   [name], followed by Dr. Gwenlyn Found, w/ h/o chronic diastolic heart failure w/ predominant RV dysfunction + pulmonary hypertension, chronic atrial fibrillation on Eliquis, severe mitral regurgitation, systemic HTN and rheumatoid arthritis on Methotrexate, being evaluated for mitral valve repair vs replacement.  Pt admitted with with malignant HTN and mitral valve regurgitation.   5/2- s/p TEE- revealed Normal LV function; severe LAE; no LAA thrombus; moderate RV dysfunction; mild bileaflet MVP with severe MR; mild to moderate TR. 5/5- s/p PROCEDURES PERFORMED: 1. Extractions of teeth numbers 3, 4, 7, 8, 9, 10, 11 and 14 2. 2 Quadrants of alveoloplasty (UR & UL)  Reviewed I/O's: -300 ml x 24 hours and -3 L since admission  UOP: 1.6 L x 24 hours  Per CVTS notes, plan for possible MV repair/ replacement.   Pt unavailable at time of visit. Attempted to speak with pt via call to hospital room phone, however, unable to reach. RD unable to obtain further nutrition-related history or complete nutrition-focused physical exam at this time.   Pt s/p dental extractions and advanced to clear liquid diet. Per dentist note, pt with many dental caries and will require multiple tooth extractions prior to cardiac surgery.   Pt previously on a regular diet with  variable intake. Noted meal completion 25-90%.   Reviewed wt hx; pt has experienced a 10.5% wt loss over the past month, which is significant for time frame. Suspect some weight loss related to diuresis (per MD notes, pt is still fluid overloaded).   Pt with increased nutritional needs to support post-op healing and would benefit from addition of oral nutrition supplements.   Medications reviewed and include vitamin D3, colace, and potassium chloride.   Labs reviewed: Na: 134.  Diet Order:   Diet Order            Diet NPO time specified  Diet effective midnight                 EDUCATION NEEDS:   No education needs have been identified at this time  Skin:  Skin Assessment: Reviewed RN Assessment  Last BM:  10/23/20  Height:   Ht Readings from Last 1 Encounters:  10/21/20 5\' 4"  (1.626 m)    Weight:   Wt Readings from Last 1 Encounters:  10/24/20 68.4 kg    Ideal Body Weight:  54.5 kg  BMI:  Body mass index is 25.87 kg/m.  Estimated Nutritional Needs:   Kcal:  2050-2250  Protein:  105-120 grams  Fluid:  > 2 L    Loistine Chance, RD, LDN, [place] Registered Dietitian II Certified Diabetes Care and Education Specialist Please refer to [facility] for RD and/or RD on-call/weekend/after hours pager

## 2020-10-24 NOTE — Transfer of Care (Signed)
Immediate Anesthesia Transfer of Care Note  Patient: Joanna Hood  Procedure(s) Performed: MULTIPLE EXTRACTION WITH ALVEOLOPLASTY (N/A Mouth)  Patient Location: PACU  Anesthesia Type:General  Level of Consciousness: awake, alert  and oriented  Airway & Oxygen Therapy: Patient Spontanous Breathing and Patient connected to face mask oxygen  Post-op Assessment: Report given to RN and Post -op Vital signs reviewed and stable  Post vital signs: Reviewed and stable  Last Vitals:  Vitals Value Taken Time  BP 126/62 10/24/20 1216  Temp    Pulse 69 10/24/20 1216  Resp 16 10/24/20 1216  SpO2 100 % 10/24/20 1216  Vitals shown include unvalidated device data.  Last Pain:  Vitals:   10/24/20 0818  TempSrc:   PainSc: 0-No pain      Patients Stated Pain Goal: 3 (23/95/32 0233)  Complications: No complications documented.

## 2020-10-24 NOTE — Progress Notes (Signed)
   Department of Dental Medicine     PREOPERATIVE NOTE  Service Date:   10/24/2020 Patient's Name:  Joanna Hood Medical Record Number:  [id number]   > Joanna Hood presents for dental procedures in the operating room. >>The patient denies any acute medical or dental changes and agrees to proceed with treatment as planned.  VITALS: BP (!) 122/41 (BP Location: Left Arm)   Pulse 61   Temp 98.6 F (37 C) (Oral)   Resp 18   Ht 5\' 4"  (1.626 m)   Wt 68.4 kg   SpO2 92%   BMI 25.87 kg/m   Lab Results  Component Value Date   WBC 6.0 10/24/2020   HGB 10.3 (L) 10/24/2020   HCT 30.6 (L) 10/24/2020   MCV 80.5 10/24/2020   PLT 165 10/24/2020   BMET    Component Value Date/Time   NA 134 (L) 10/24/2020 0258   NA 141 10/14/2020 1158   K 4.5 10/24/2020 0258   CL 104 10/24/2020 0258   CO2 23 10/24/2020 0258   GLUCOSE 93 10/24/2020 0258   BUN 8 10/24/2020 0258   BUN 10 10/14/2020 1158   CREATININE 0.90 10/24/2020 0258   CREATININE 0.81 09/01/2013 1512   CALCIUM 8.7 (L) 10/24/2020 0258   GFRNONAA >60 10/24/2020 0258   GFRAA 79 (L) 05/03/2013 0455    Lab Results  Component Value Date   INR 1.00 05/01/2013   No results found for: PTT   EXAM: No sign of acute dental changes.   ASSESSMENT:  1. Dental caries 2. Periodontal disease   PLAN: >> The patient agrees to proceed with treatment as planned in the operating room as previously discussed and accepts the risks, benefits, and complications of the proposed treatment.  She has decided to have all teeth extracted that is the clinically best decision which she is aware includes possibly all of her upper teeth or several of her upper teeth.  Patient is aware of the risk for bleeding, bruising, swelling, infection, pain, nerve damage, sinus involvement, root tip fracture, mandible fracture, and the risks of complications associated with the anesthesia. Patient also is aware of the potential for other  complications not mentioned above.   Guayanilla Benson Norway, DMD

## 2020-10-25 ENCOUNTER — Inpatient Hospital Stay (HOSPITAL_COMMUNITY)
Admission: RE | Disposition: A | Discharge status: Home / Self Care | Payer: Self-pay | Source: Home / Self Care | Attending: Thoracic Surgery (Cardiothoracic Vascular Surgery)

## 2020-10-25 ENCOUNTER — Encounter (HOSPITAL_COMMUNITY): Payer: Self-pay | Admitting: Dentistry

## 2020-10-25 DIAGNOSIS — I051 Rheumatic mitral insufficiency: Secondary | ICD-10-CM | POA: Diagnosis not present

## 2020-10-25 DIAGNOSIS — I5033 Acute on chronic diastolic (congestive) heart failure: Secondary | ICD-10-CM

## 2020-10-25 DIAGNOSIS — I340 Nonrheumatic mitral (valve) insufficiency: Secondary | ICD-10-CM

## 2020-10-25 DIAGNOSIS — I272 Pulmonary hypertension, unspecified: Secondary | ICD-10-CM | POA: Diagnosis not present

## 2020-10-25 DIAGNOSIS — I4821 Permanent atrial fibrillation: Secondary | ICD-10-CM | POA: Diagnosis not present

## 2020-10-25 HISTORY — PX: RIGHT HEART CATH: CATH118263

## 2020-10-25 LAB — POCT I-STAT EG7
Acid-Base Excess: 2 mmol/L (ref 0.0–2.0)
Acid-Base Excess: 3 mmol/L — ABNORMAL HIGH (ref 0.0–2.0)
Bicarbonate: 26.6 mmol/L (ref 20.0–28.0)
Bicarbonate: 27.8 mmol/L (ref 20.0–28.0)
Calcium, Ion: 1.21 mmol/L (ref 1.15–1.40)
Calcium, Ion: 1.23 mmol/L (ref 1.15–1.40)
HCT: 34 % — ABNORMAL LOW (ref 36.0–46.0)
HCT: 36 % (ref 36.0–46.0)
Hemoglobin: 11.6 g/dL — ABNORMAL LOW (ref 12.0–15.0)
Hemoglobin: 12.2 g/dL (ref 12.0–15.0)
O2 Saturation: 69 %
O2 Saturation: 72 %
Potassium: 3.8 mmol/L (ref 3.5–5.1)
Potassium: 3.8 mmol/L (ref 3.5–5.1)
Sodium: 137 mmol/L (ref 135–145)
Sodium: 138 mmol/L (ref 135–145)
TCO2: 28 mmol/L (ref 22–32)
TCO2: 29 mmol/L (ref 22–32)
pCO2, Ven: 40.3 mmHg — ABNORMAL LOW (ref 44.0–60.0)
pCO2, Ven: 41 mmHg — ABNORMAL LOW (ref 44.0–60.0)
pH, Ven: 7.429 (ref 7.250–7.430)
pH, Ven: 7.44 — ABNORMAL HIGH (ref 7.250–7.430)
pO2, Ven: 35 mmHg (ref 32.0–45.0)
pO2, Ven: 37 mmHg (ref 32.0–45.0)

## 2020-10-25 LAB — BASIC METABOLIC PANEL
Anion gap: 8 (ref 5–15)
BUN: 12 mg/dL (ref 8–23)
CO2: 24 mmol/L (ref 22–32)
Calcium: 9.3 mg/dL (ref 8.9–10.3)
Chloride: 99 mmol/L (ref 98–111)
Creatinine, Ser: 1.01 mg/dL — ABNORMAL HIGH (ref 0.44–1.00)
GFR, Estimated: 59 mL/min — ABNORMAL LOW (ref >60)
Glucose, Bld: 123 mg/dL — ABNORMAL HIGH (ref 70–99)
Potassium: 4.3 mmol/L (ref 3.5–5.1)
Sodium: 131 mmol/L — ABNORMAL LOW (ref 135–145)

## 2020-10-25 LAB — CBC
HCT: 32.7 % — ABNORMAL LOW (ref 36.0–46.0)
Hemoglobin: 11.1 g/dL — ABNORMAL LOW (ref 12.0–15.0)
MCH: 27.1 pg (ref 26.0–34.0)
MCHC: 33.9 g/dL (ref 30.0–36.0)
MCV: 79.8 fL — ABNORMAL LOW (ref 80.0–100.0)
Platelets: 193 10*3/uL (ref 150–400)
RBC: 4.1 MIL/uL (ref 3.87–5.11)
RDW: 14.8 % (ref 11.5–15.5)
WBC: 7.8 10*3/uL (ref 4.0–10.5)
nRBC: 0 % (ref 0.0–0.2)

## 2020-10-25 LAB — HEPARIN LEVEL (UNFRACTIONATED): Heparin Unfractionated: 0.39 IU/mL (ref 0.30–0.70)

## 2020-10-25 SURGERY — RIGHT HEART CATH
Anesthesia: LOCAL

## 2020-10-25 MED ORDER — NOREPINEPHRINE 4 MG/250ML-% IV SOLN
0.0000 ug/min | INTRAVENOUS | Status: DC
  Filled 2020-10-25: qty 250

## 2020-10-25 MED ORDER — ONDANSETRON HCL 4 MG/2ML IJ SOLN: 4.0000 mg | Freq: Four times a day (QID) | INTRAMUSCULAR | Status: DC | PRN

## 2020-10-25 MED ORDER — SODIUM CHLORIDE 0.9% FLUSH: 3.0000 mL | INTRAVENOUS | Status: DC | PRN

## 2020-10-25 MED ORDER — HYDRALAZINE HCL 20 MG/ML IJ SOLN: 10.0000 mg | INTRAMUSCULAR | Status: AC | PRN

## 2020-10-25 MED ORDER — PLASMA-LYTE 148 IV SOLN
INTRAVENOUS | Status: DC
  Filled 2020-10-25: qty 2.5

## 2020-10-25 MED ORDER — SODIUM CHLORIDE 0.9 % IV SOLN
INTRAVENOUS | Status: DC
  Filled 2020-10-25: qty 30

## 2020-10-25 MED ORDER — EPINEPHRINE HCL 5 MG/250ML IV SOLN IN NS
0.0000 ug/min | INTRAVENOUS | Status: DC
  Filled 2020-10-25: qty 250

## 2020-10-25 MED ORDER — POTASSIUM CHLORIDE 2 MEQ/ML IV SOLN
80.0000 meq | INTRAVENOUS | Status: DC
  Filled 2020-10-25: qty 40

## 2020-10-25 MED ORDER — PHENYLEPHRINE HCL-NACL 20-0.9 MG/250ML-% IV SOLN
30.0000 ug/min | INTRAVENOUS | Status: DC
  Filled 2020-10-25: qty 250

## 2020-10-25 MED ORDER — LABETALOL HCL 5 MG/ML IV SOLN: 10.0000 mg | INTRAVENOUS | Status: AC | PRN

## 2020-10-25 MED ORDER — SODIUM CHLORIDE 0.9 % IV SOLN: 250.0000 mL | INTRAVENOUS | Status: DC | PRN

## 2020-10-25 MED ORDER — SODIUM CHLORIDE 0.9% FLUSH
3.0000 mL | Freq: Two times a day (BID) | INTRAVENOUS | Status: DC
  Administered 2020-10-25 – 2020-10-27 (×3): 3 mL via INTRAVENOUS

## 2020-10-25 MED ORDER — MILRINONE LACTATE IN DEXTROSE 20-5 MG/100ML-% IV SOLN
0.2500 ug/kg/min | INTRAVENOUS | Status: DC
  Administered 2020-10-25 – 2020-10-26 (×2): 0.125 ug/kg/min via INTRAVENOUS
  Administered 2020-10-30: 0.25 ug/kg/min via INTRAVENOUS
  Administered 2020-11-01: 0.125 ug/kg/min via INTRAVENOUS
  Administered 2020-11-02: 0.25 ug/kg/min via INTRAVENOUS
  Filled 2020-10-25 (×11): qty 100

## 2020-10-25 MED ORDER — HEPARIN (PORCINE) 25000 UT/250ML-% IV SOLN
1000.0000 [IU]/h | INTRAVENOUS | Status: AC
  Administered 2020-10-25 (×2): 950 [IU]/h via INTRAVENOUS
  Administered 2020-10-27: 1000 [IU]/h via INTRAVENOUS
  Filled 2020-10-25 (×3): qty 250

## 2020-10-25 MED ORDER — INSULIN REGULAR(HUMAN) IN NACL 100-0.9 UT/100ML-% IV SOLN
INTRAVENOUS | Status: DC
  Filled 2020-10-25: qty 100

## 2020-10-25 MED ORDER — TRANEXAMIC ACID (OHS) BOLUS VIA INFUSION
15.0000 mg/kg | INTRAVENOUS | Status: DC
  Filled 2020-10-25: qty 1011

## 2020-10-25 MED ORDER — ACETAMINOPHEN 325 MG PO TABS
650.0000 mg | ORAL_TABLET | ORAL | Status: DC | PRN
  Administered 2020-10-26 – 2020-10-27 (×2): 650 mg via ORAL
  Filled 2020-10-25 (×2): qty 2

## 2020-10-25 MED ORDER — NITROGLYCERIN IN D5W 200-5 MCG/ML-% IV SOLN
2.0000 ug/min | INTRAVENOUS | Status: DC
  Filled 2020-10-25: qty 250

## 2020-10-25 MED ORDER — VANCOMYCIN HCL 1000 MG IV SOLR
INTRAVENOUS | Status: DC
  Filled 2020-10-25: qty 1000

## 2020-10-25 MED ORDER — CEFAZOLIN SODIUM-DEXTROSE 2-4 GM/100ML-% IV SOLN
2.0000 g | INTRAVENOUS | Status: DC
  Filled 2020-10-25: qty 100

## 2020-10-25 MED ORDER — LIDOCAINE HCL (PF) 1 % IJ SOLN
INTRAMUSCULAR | Status: DC | PRN
  Administered 2020-10-25: 2 mL

## 2020-10-25 MED ORDER — MANNITOL 20 % IV SOLN
Freq: Once | INTRAVENOUS | Status: DC
  Filled 2020-10-25: qty 13

## 2020-10-25 MED ORDER — DEXMEDETOMIDINE HCL IN NACL 400 MCG/100ML IV SOLN
0.1000 ug/kg/h | INTRAVENOUS | Status: DC
Start: 2020-10-28 — End: 2020-10-25
  Filled 2020-10-25: qty 100

## 2020-10-25 MED ORDER — HEPARIN (PORCINE) IN NACL 1000-0.9 UT/500ML-% IV SOLN
INTRAVENOUS | Status: DC | PRN
  Administered 2020-10-25: 500 mL

## 2020-10-25 MED ORDER — TRANEXAMIC ACID (OHS) PUMP PRIME SOLUTION
2.0000 mg/kg | INTRAVENOUS | Status: DC
  Filled 2020-10-25: qty 1.35

## 2020-10-25 MED ORDER — GLUTARALDEHYDE 0.625% SOAKING SOLUTION
TOPICAL | Status: DC
  Filled 2020-10-25: qty 50

## 2020-10-25 MED ORDER — LIDOCAINE HCL (PF) 1 % IJ SOLN
INTRAMUSCULAR | Status: AC
  Filled 2020-10-25: qty 30

## 2020-10-25 MED ORDER — TRANEXAMIC ACID 1000 MG/10ML IV SOLN
1.5000 mg/kg/h | INTRAVENOUS | Status: DC
  Filled 2020-10-25: qty 25

## 2020-10-25 MED ORDER — VANCOMYCIN HCL 1250 MG/250ML IV SOLN
1250.0000 mg | INTRAVENOUS | Status: DC
  Filled 2020-10-25: qty 250

## 2020-10-25 MED ORDER — HEPARIN (PORCINE) IN NACL 1000-0.9 UT/500ML-% IV SOLN
INTRAVENOUS | Status: AC
  Filled 2020-10-25: qty 1000

## 2020-10-25 MED ORDER — MILRINONE LACTATE IN DEXTROSE 20-5 MG/100ML-% IV SOLN
0.3000 ug/kg/min | INTRAVENOUS | Status: DC
  Filled 2020-10-25: qty 100

## 2020-10-25 MED FILL — Heparin Sodium (Porcine) Inj 1000 Unit/ML: INTRAMUSCULAR | Qty: 10 | Status: AC

## 2020-10-25 SURGICAL SUPPLY — 5 items
CATH SWAN GANZ 7F STRAIGHT (CATHETERS) ×1 IMPLANT
GLIDESHEATH SLENDER 7FR .021G (SHEATH) ×1 IMPLANT
KIT HEART LEFT (KITS) ×2 IMPLANT
PACK CARDIAC CATHETERIZATION (CUSTOM PROCEDURE TRAY) ×2 IMPLANT
TRANSDUCER W/STOPCOCK (MISCELLANEOUS) ×2 IMPLANT

## 2020-10-25 NOTE — Progress Notes (Signed)
      [address]       [place] [address]             [phone]     CARDIOTHORACIC SURGERY PROGRESS NOTE  1 Day Post-Op  S/P Procedure(s) (LRB): MULTIPLE EXTRACTION WITH ALVEOLOPLASTY (N/A)  Subjective: No complaints.  Did well w/ dental extraction  Objective: Vital signs in last 24 hours: Temp:  [97 F (36.1 C)-98.4 F (36.9 C)] 97.6 F (36.4 C) (05/06 0434) Pulse Rate:  [41-74] 62 (05/06 0434) Cardiac Rhythm: Atrial fibrillation (05/05 2025) Resp:  [14-20] 16 (05/06 0434) BP: (114-158)/(53-83) 129/69 (05/06 0434) SpO2:  [93 %-100 %] 97 % (05/06 0434) Weight:  [67.4 kg] 67.4 kg (05/06 0434)  Physical Exam:  Rhythm:   AF  Breath sounds: clear  Heart sounds:  irregular  Incisions:  n/a  Abdomen:  soft  Extremities:  Warm, no edema   Intake/Output from previous day: 05/05 0701 - 05/06 0700 In: 1791.2 [P.O.:1020; I.V.:671.2; IV Piggyback:100] Out: 2400 [Urine:2350; Blood:50] Intake/Output this shift: No intake/output data recorded.  Lab Results: Recent Labs    10/24/20 0258 10/25/20 0513  WBC 6.0 7.8  HGB 10.3* 11.1*  HCT 30.6* 32.7*  PLT 165 193   BMET:  Recent Labs    10/24/20 0258 10/25/20 0513  NA 134* 131*  K 4.5 4.3  CL 104 99  CO2 23 24  GLUCOSE 93 123*  BUN 8 12  CREATININE 0.90 1.01*  CALCIUM 8.7* 9.3    CBG (last 3)  No results for input(s): GLUCAP in the last 72 hours. PT/INR:  No results for input(s): LABPROT, INR in the last 72 hours.  CXR:  N/A  Assessment/Plan: S/P Procedure(s) (LRB): MULTIPLE EXTRACTION WITH ALVEOLOPLASTY (N/A)  Clinically stable.  For f/u right heart cath today per Dr [name].  Tentatively plan MVR +/- TVR on Monday.  Rexene Alberts, MD 10/25/2020 7:51 AM

## 2020-10-25 NOTE — Progress Notes (Signed)
Soft BP this evening. Cardiology notified. Altace and Metoprolol held per MD.

## 2020-10-25 NOTE — Interval H&P Note (Signed)
History and Physical Interval Note:  10/25/2020 12:24 PM  Joanna Hood  has presented today for surgery, with the diagnosis of heart failure.  The various methods of treatment have been discussed with the patient and family. After consideration of risks, benefits and other options for treatment, the patient has consented to  Procedure(s): RIGHT HEART CATH (N/A) as a surgical intervention.  The patient's history has been reviewed, patient examined, no change in status, stable for surgery.  I have reviewed the patient's chart and labs.  Questions were answered to the patient's satisfaction.     [name]

## 2020-10-25 NOTE — Progress Notes (Addendum)
[place] for heparin Indication: atrial fibrillation  Allergies  Allergen Reactions  . Metronidazole Itching and Rash    Patient Measurements: Height: 5' 4"  (162.6 cm) Weight: 67.4 kg (148 lb 8 oz) IBW/kg (Calculated) : 54.7 Heparin Dosing Weight: 67kg  Vital Signs: Temp: 97.6 F (36.4 C) (05/06 0434) Temp Source: Oral (05/06 0434) BP: 129/69 (05/06 0434) Pulse Rate: 62 (05/06 0434)  Labs: Recent Labs    10/23/20 0006 10/23/20 0648 10/24/20 0258 10/25/20 0513  HGB 10.7*  --  10.3* 11.1*  HCT 31.9*  --  30.6* 32.7*  PLT 175  --  165 193  APTT 75*  --   --   --   HEPARINUNFRC 0.40 0.49 0.11* 0.39  CREATININE 0.85  --  0.90 1.01*    Estimated Creatinine Clearance: 46.8 mL/min (A) (by C-G formula based on SCr of 1.01 mg/dL (H)).   Medical History: Past Medical History:  Diagnosis Date  . Atrial fibrillation with RVR ([place]) 2014   persistent  . Chronic diastolic CHF (congestive heart failure) (H[facility])    HTN  . Chronic diastolic congestive heart failure ([place])   . COVID-19 virus infection 01/20/2019  . ESOPHAGEAL STRICTURE 02/19/2005   Qualifier: Diagnosis of  By: Amil Amen MD, Benjamine Mola    . Goiter   . History of anemia    unclear cause, now resolved  . History of chicken pox   . History of colon polyps    benign  . HLD (hyperlipidemia)   . Hypothyroidism   . Malignant hypertension longstanding  . Microcytosis   . Mitral valve regurgitation   . Osteopenia 12/2013   T -2.3 hip  . Osteoporosis   . Pulmonary hypertension ([place])   . Rheumatoid arthritis ([place]) 11/2015   +RF, +CCP, ESR 49, synovitis on exam and [place] 11/2015 [facility])  . Right lower lobe pneumonia 07/24/2014  . Tricuspid regurgitation   . Vitamin D deficiency      Assessment: 43 yoF admitted with LHC and TEE found to have severe mitral regurgitation with plans for MVR. Pt on apixaban PTA for hx AFib (last dose 4/29), pharmacy to start IV heparin perioperatively.    Heparin level is therapeutic at 0.39, on 950 units/hr. Hgb 11.1, plt 193. No s/sx of bleeding or infusion issues. Plan for RHC today.  Goal of Therapy:  Heparin level 0.3-0.7 units/ml Monitor platelets by anticoagulation protocol: Yes   Plan:  Continue heparin infusion at 950 units/hr Continue daily HL, CBC, and for s/sx of bleeding  Antonietta Jewel, PharmD, [place] Pharmacist  Phone: [phone] 10/25/2020 11:46 AM  Please check AMION for all [place] phone numbers After 10:00 PM, call [place] [phone]   ADDENDUM Underwent cardiac cath 5/6 showing improved hemodynamics but still elevated PA pressures - plan to start milrinone pre-op before MVR +/- TVR + Maze on 5/9. Plan to restart heparin 2 hours after sheath removal (documented on 5/6@1351 ). Restart heparin infusion at 950 units/hr on 5/6@1600  and recheck heparin level in AM.   Antonietta Jewel, PharmD, [place] Pharmacist  Phone: [phone] 10/25/2020 3:18 PM  Please check AMION for all [place] phone numbers After 10:00 PM, call [place] [phone]

## 2020-10-25 NOTE — H&P (View-Only) (Signed)
  Advanced Heart Failure Rounding Note  PCP-Cardiologist: Dr. Berry AHF: Dr. Raiyah Hood   Patient Profile   [name], followed by Dr. Berry, w/ h/o chronic diastolic heart failure w/ predominant RV dysfunction + pulmonary hypertension, chronic atrial fibrillation on Eliquis, severe mitral regurgitation, systemic HTN and rheumatoid arthritis on Methotrexate, being evaluated for mitral valve repair vs replacement.   Subjective:    5/5 Underwent upper teeth extraction today and was uneventful.   Feels ok. Denies mouth pain.     Objective:   Weight Range: 67.4 kg Body mass index is 25.49 kg/m.   Vital Signs:   Temp:  [97 F (36.1 C)-98.6 F (37 C)] 98.6 F (37 C) (05/06 1113) Pulse Rate:  [41-74] 52 (05/06 1113) Resp:  [14-20] 16 (05/06 1113) BP: (114-158)/(53-83) 130/58 (05/06 1113) SpO2:  [93 %-100 %] 97 % (05/06 1113) Weight:  [67.4 kg] 67.4 kg (05/06 0434) Last BM Date: 10/23/20  Weight change: Filed Weights   10/23/20 0138 10/24/20 0400 10/25/20 0434  Weight: 66.7 kg 68.4 kg 67.4 kg    Intake/Output:   Intake/Output Summary (Last 24 hours) at 10/25/2020 1141 Last data filed at 10/25/2020 1047 Gross per 24 hour  Intake 1691.18 ml  Output 2850 ml  Net -1158.82 ml      Physical Exam    General:  Sitting in the chair.  No resp difficulty HEENT:  No bleeding after oral surgery.  Neck: supple. no JVD. Carotids 2+ bilat; no bruits. No lymphadenopathy or thryomegaly appreciated. Cor: PMI nondisplaced. Irregular rate & rhythm. No rubs, gallops . 2/6 MR  Lungs: clear Abdomen: soft, nontender, nondistended. No hepatosplenomegaly. No bruits or masses. Good bowel sounds. Extremities: no cyanosis, clubbing, rash, edema Neuro: alert & orientedx3, cranial nerves grossly intact. moves all 4 extremities w/o difficulty. Affect pleasant    Telemetry   Chronic  Afib 60-70s    Labs    CBC Recent Labs    10/24/20 0258 10/25/20 0513  WBC 6.0 7.8  HGB 10.3*  11.1*  HCT 30.6* 32.7*  MCV 80.5 79.8*  PLT 165 193   Basic Metabolic Panel Recent Labs    10/23/20 1134 10/24/20 0258 10/25/20 0513  NA  --  134* 131*  K 3.2* 4.5 4.3  CL  --  104 99  CO2  --  23 24  GLUCOSE  --  93 123*  BUN  --  8 12  CREATININE  --  0.90 1.01*  CALCIUM  --  8.7* 9.3  MG 1.4* 2.4  --    Liver Function Tests Recent Labs    10/23/20 0006  AST 25  ALT 26  ALKPHOS 68  BILITOT 1.8*  PROT 6.7  ALBUMIN 3.1*   No results for input(s): LIPASE, AMYLASE in the last 72 hours. Cardiac Enzymes No results for input(s): CKTOTAL, CKMB, CKMBINDEX, TROPONINI in the last 72 hours.  BNP: BNP (last 3 results) Recent Labs    10/23/20 0006  BNP 490.4*    ProBNP (last 3 results) No results for input(s): PROBNP in the last 8760 hours.   D-Dimer No results for input(s): DDIMER in the last 72 hours. Hemoglobin A1C No results for input(s): HGBA1C in the last 72 hours. Fasting Lipid Panel No results for input(s): CHOL, HDL, LDLCALC, TRIG, CHOLHDL, LDLDIRECT in the last 72 hours. Thyroid Function Tests No results for input(s): TSH, T4TOTAL, T3FREE, THYROIDAB in the last 72 hours.  Invalid input(s): FREET3  Other results:   Imaging      No results found.   Medications:     Scheduled Medications: . amLODipine  10 mg Oral Daily  . calcium-vitamin D  1 tablet Oral Daily  . cholecalciferol  1,000 Units Oral Daily  . docusate sodium  100 mg Oral Daily  . [START ON 10/28/2020] epinephrine  0-10 mcg/min Intravenous To OR  . feeding supplement  1 Container Oral TID BM  . [START ON 10/28/2020] glutaraldehyde   Topical On Call to OR  . [START ON 10/28/2020] heparin-papaverine-plasmalyte irrigation   Irrigation To OR  . [START ON 10/28/2020] insulin   Intravenous To OR  . [START ON 10/28/2020] [place] Blood Cardioplegia vial (lidocaine/magnesium/mannitol 0.26g-4g-6.4g)   Intracoronary Once  . levothyroxine  125 mcg Oral Q0600  . metoprolol tartrate  37.5 mg Oral BID   . multivitamin with minerals  1 tablet Oral Daily  . [START ON 10/28/2020] phenylephrine  30-200 mcg/min Intravenous To OR  . [START ON 10/28/2020] potassium chloride  80 mEq Other To OR  . potassium chloride SA  20 mEq Oral Daily  . ramipril  10 mg Oral BID  . sodium chloride flush  3 mL Intravenous Q12H  . sodium chloride flush  3 mL Intravenous Q12H  . sodium chloride flush  3 mL Intravenous Q12H  . torsemide  40 mg Oral Daily  . [START ON 10/28/2020] tranexamic acid  15 mg/kg Intravenous To OR  . [START ON 10/28/2020] tranexamic acid  2 mg/kg Intracatheter To OR  . [START ON 10/28/2020] vancomycin 1000 mg in NS (1000 ml) irrigation for Dr. Roxy Manns case   Irrigation To OR    Infusions: . sodium chloride    . sodium chloride    . sodium chloride 10 mL/hr at 10/25/20 0643  . [START ON 10/28/2020]  ceFAZolin (ANCEF) IV    . [START ON 10/28/2020]  ceFAZolin (ANCEF) IV    . [START ON 10/28/2020] dexmedetomidine    . [START ON 10/28/2020] heparin 30,000 units/NS 1000 mL solution for CELLSAVER    . heparin 950 Units/hr (10/24/20 2112)  . lactated ringers 10 mL/hr at 10/24/20 2109  . [START ON 10/28/2020] milrinone    . [START ON 10/28/2020] nitroGLYCERIN    . [START ON 10/28/2020] norepinephrine    . [START ON 10/28/2020] tranexamic acid (CYKLOKAPRON) infusion ([facility])    . [START ON 10/28/2020] vancomycin      PRN Medications: sodium chloride, sodium chloride, acetaminophen, morphine injection, ondansetron (ZOFRAN) IV, sodium chloride flush, sodium chloride flush    Assessment/Plan   1. Severe Rheumatic Mitral Valve Regurgitation  - Primary MR. LVEF normal 60-65%  - TEE this admit confirmed severe MR. LHC w/ normal coronaries - Not candidate for MitraClip per Dr. Burt Knack  - CT surgery consulted for potential minimally invasive mitral valve repair/replacement - s/p teeth extraction today 5/5 - Plan MVR +/- TVR for Monday.  - RHC today to optimize prior to surgery. .  2. Severe Pulmonary Hypertension  -  TTE 8/21 w/ severely elevated RVSP, 78 mmHg. 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 WHO Group 1 2/2 CTD (RA)   3. Acute on Chronic Diastolic Heart Failure w/ Predominant RV Dysfunction  - 2D Echo 8/21 showed normal LVEF, 60-65%, w/ mild LVH and G3DD c/w restrictive physiology. RV was mildly reduced at the time - TEE  10/21/20 LVEF normal. RV systolic function is now moderately reduced w/ mild-mod TR, in setting of progressive/ now severe MR. - RHC this admit; RAP 16, LVEDP 16. Cardiac output 4.9 L/min with an index of 2.8 L/min/m. -Volume status stable. Continue torsemide. Renal function stable.  Can adjust diuretics after RHC.  - Continue ? blocker and home antihypertensives for BP/HR control   4. Chronic Afib: in setting of severe MR w/ severe LAE. - rate controlled w/ metoprolol  - Eliquis currently on hold.  - Covering w/ heparin until invasive procedures are completed - restart heparin later this afternoon. Discussed dosing with PharmD personally.  5. Rheumatoid Arthritis - on MTX - followed by rheumatology   6. Hyponatremia - Sodium 131  RHC later today.      Length of Stay: 2  Joanna Clegg, NP  10/25/2020, 11:41 AM  Advanced Heart Failure Team Pager [phone] (M-F; 7a - 5p)  Please contact [facility] for night-coverage after hours (5p -7a ) and weekends on [email or web address]  Patient seen and examined with the above-signed Advanced Practice Provider and/or Housestaff. I personally reviewed laboratory data, imaging studies and relevant notes. I independently examined the patient and formulated the important aspects of the plan. I have edited the note to reflect any of my changes or salient points. I have personally discussed the plan with the patient and/or family.  Had teeth extracted yesterday. She looks  good. Denies CP or SOB. Weight down. Back on heparin without bleeding.  General:  Well appearing. No resp difficulty HEENT: normal + teeth extracted Neck: supple. no JVD. Carotids 2+ bilat; no bruits. No lymphadenopathy or thryomegaly appreciated. Cor: PMI nondisplaced. Irregular rate & rhythm. 2/6 MR Lungs: clear Abdomen: soft, nontender, nondistended. No hepatosplenomegaly. No bruits or masses. Good bowel sounds. Extremities: no cyanosis, clubbing, rash, edema Neuro: alert & orientedx3, cranial nerves grossly intact. moves all 4 extremities w/o difficulty. Affect pleasant  Scheduled for MVR +/- TVR + Maze on Monday. Plan RHC later today to assess need for pre-op milrinone for support. Continue heparin for AF. D/w Dr. Owen.   Joanna Estrin, MD  11:54 AM    

## 2020-10-25 NOTE — Progress Notes (Addendum)
Advanced Heart Failure Rounding Note  PCP-Cardiologist: Dr. Gwenlyn Found AHF: Dr. [name]   Patient Profile   [name], followed by Dr. Gwenlyn Found, w/ h/o chronic diastolic heart failure w/ predominant RV dysfunction + pulmonary hypertension, chronic atrial fibrillation on Eliquis, severe mitral regurgitation, systemic HTN and rheumatoid arthritis on Methotrexate, being evaluated for mitral valve repair vs replacement.   Subjective:    5/5 Underwent upper teeth extraction today and was uneventful.   Feels ok. Denies mouth pain.     Objective:   Weight Range: 67.4 kg Body mass index is 25.49 kg/m.   Vital Signs:   Temp:  [97 F (36.1 C)-98.6 F (37 C)] 98.6 F (37 C) (05/06 1113) Pulse Rate:  [41-74] 52 (05/06 1113) Resp:  [14-20] 16 (05/06 1113) BP: (114-158)/(53-83) 130/58 (05/06 1113) SpO2:  [93 %-100 %] 97 % (05/06 1113) Weight:  [67.4 kg] 67.4 kg (05/06 0434) Last BM Date: 10/23/20  Weight change: Filed Weights   10/23/20 0138 10/24/20 0400 10/25/20 0434  Weight: 66.7 kg 68.4 kg 67.4 kg    Intake/Output:   Intake/Output Summary (Last 24 hours) at 10/25/2020 1141 Last data filed at 10/25/2020 1047 Gross per 24 hour  Intake 1691.18 ml  Output 2850 ml  Net -1158.82 ml      Physical Exam    General:  Sitting in the chair.  No resp difficulty HEENT:  No bleeding after oral surgery.  Neck: supple. no JVD. Carotids 2+ bilat; no bruits. No lymphadenopathy or thryomegaly appreciated. Cor: PMI nondisplaced. Irregular rate & rhythm. No rubs, gallops . 2/6 MR  Lungs: clear Abdomen: soft, nontender, nondistended. No hepatosplenomegaly. No bruits or masses. Good bowel sounds. Extremities: no cyanosis, clubbing, rash, edema Neuro: alert & orientedx3, cranial nerves grossly intact. moves all 4 extremities w/o difficulty. Affect pleasant    Telemetry   Chronic  Afib 60-70s    Labs    CBC Recent Labs    10/24/20 0258 10/25/20 0513  WBC 6.0 7.8  HGB 10.3*  11.1*  HCT 30.6* 32.7*  MCV 80.5 79.8*  PLT 165 102   Basic Metabolic Panel Recent Labs    10/23/20 1134 10/24/20 0258 10/25/20 0513  NA  --  134* 131*  K 3.2* 4.5 4.3  CL  --  104 99  CO2  --  23 24  GLUCOSE  --  93 123*  BUN  --  8 12  CREATININE  --  0.90 1.01*  CALCIUM  --  8.7* 9.3  MG 1.4* 2.4  --    Liver Function Tests Recent Labs    10/23/20 0006  AST 25  ALT 26  ALKPHOS 68  BILITOT 1.8*  PROT 6.7  ALBUMIN 3.1*   No results for input(s): LIPASE, AMYLASE in the last 72 hours. Cardiac Enzymes No results for input(s): CKTOTAL, CKMB, CKMBINDEX, TROPONINI in the last 72 hours.  BNP: BNP (last 3 results) Recent Labs    10/23/20 0006  BNP 490.4*    ProBNP (last 3 results) No results for input(s): PROBNP in the last 8760 hours.   D-Dimer No results for input(s): DDIMER in the last 72 hours. Hemoglobin A1C No results for input(s): HGBA1C in the last 72 hours. Fasting Lipid Panel No results for input(s): CHOL, HDL, LDLCALC, TRIG, CHOLHDL, LDLDIRECT in the last 72 hours. Thyroid Function Tests No results for input(s): TSH, T4TOTAL, T3FREE, THYROIDAB in the last 72 hours.  Invalid input(s): FREET3  Other results:   Imaging  No results found.   Medications:     Scheduled Medications: . amLODipine  10 mg Oral Daily  . calcium-vitamin D  1 tablet Oral Daily  . cholecalciferol  1,000 Units Oral Daily  . docusate sodium  100 mg Oral Daily  . [START ON 10/28/2020] epinephrine  0-10 mcg/min Intravenous To OR  . feeding supplement  1 Container Oral TID BM  . [START ON 10/28/2020] glutaraldehyde   Topical On Call to OR  . [START ON 10/28/2020] heparin-papaverine-plasmalyte irrigation   Irrigation To OR  . [START ON 10/28/2020] insulin   Intravenous To OR  . [START ON 10/28/2020] [place] Blood Cardioplegia vial (lidocaine/magnesium/mannitol 0.26g-4g-6.4g)   Intracoronary Once  . levothyroxine  125 mcg Oral Q0600  . metoprolol tartrate  37.5 mg Oral BID   . multivitamin with minerals  1 tablet Oral Daily  . [START ON 10/28/2020] phenylephrine  30-200 mcg/min Intravenous To OR  . [START ON 10/28/2020] potassium chloride  80 mEq Other To OR  . potassium chloride SA  20 mEq Oral Daily  . ramipril  10 mg Oral BID  . sodium chloride flush  3 mL Intravenous Q12H  . sodium chloride flush  3 mL Intravenous Q12H  . sodium chloride flush  3 mL Intravenous Q12H  . torsemide  40 mg Oral Daily  . [START ON 10/28/2020] tranexamic acid  15 mg/kg Intravenous To OR  . [START ON 10/28/2020] tranexamic acid  2 mg/kg Intracatheter To OR  . [START ON 10/28/2020] vancomycin 1000 mg in NS (1000 ml) irrigation for Dr. Roxy Manns case   Irrigation To OR    Infusions: . sodium chloride    . sodium chloride    . sodium chloride 10 mL/hr at 10/25/20 0643  . [START ON 10/28/2020]  ceFAZolin (ANCEF) IV    . [START ON 10/28/2020]  ceFAZolin (ANCEF) IV    . [START ON 10/28/2020] dexmedetomidine    . [START ON 10/28/2020] heparin 30,000 units/NS 1000 mL solution for CELLSAVER    . heparin 950 Units/hr (10/24/20 2112)  . lactated ringers 10 mL/hr at 10/24/20 2109  . [START ON 10/28/2020] milrinone    . [START ON 10/28/2020] nitroGLYCERIN    . [START ON 10/28/2020] norepinephrine    . [START ON 10/28/2020] tranexamic acid (CYKLOKAPRON) infusion ([facility])    . [START ON 10/28/2020] vancomycin      PRN Medications: sodium chloride, sodium chloride, acetaminophen, morphine injection, ondansetron (ZOFRAN) IV, sodium chloride flush, sodium chloride flush    Assessment/Plan   1. Severe Rheumatic Mitral Valve Regurgitation  - Primary MR. LVEF normal 60-65%  - TEE this admit confirmed severe MR. LHC w/ normal coronaries - Not candidate for MitraClip per Dr. Burt Knack  - CT surgery consulted for potential minimally invasive mitral valve repair/replacement - s/p teeth extraction today 5/5 - Plan MVR +/- TVR for Monday.  - RHC today to optimize prior to surgery. .  2. Severe Pulmonary Hypertension  -  TTE 8/21 w/ severely elevated RVSP, 78 mmHg. 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 WHO Group 1 2/2 CTD (RA)   3. Acute on Chronic Diastolic Heart Failure w/ Predominant RV Dysfunction  - 2D Echo 8/21 showed normal LVEF, 60-65%, w/ mild LVH and G3DD c/w restrictive physiology. RV was mildly reduced at the time - TEE  10/21/20 LVEF normal. RV systolic function is now moderately reduced w/ mild-mod TR, in setting of progressive/ now severe MR. - RHC this admit; RAP 16, LVEDP 16. Cardiac output 4.9 L/min with an index of 2.8 L/min/m. -Volume status stable. Continue torsemide. Renal function stable.  Can adjust diuretics after RHC.  - Continue ? blocker and home antihypertensives for BP/HR control   4. Chronic Afib: in setting of severe MR w/ severe LAE. - rate controlled w/ metoprolol  - Eliquis currently on hold.  - Covering w/ heparin until invasive procedures are completed - restart heparin later this afternoon. Discussed dosing with PharmD personally.  5. Rheumatoid Arthritis - on MTX - followed by rheumatology   6. Hyponatremia - Sodium 131  RHC later today.      Length of Stay: 2  Darrick Grinder, NP  10/25/2020, 11:41 AM  Advanced Heart Failure Team Pager [phone] (M-F; 7a - 5p)  Please contact [facility] for night-coverage after hours (5p -7a ) and weekends on [email or web address]  Patient seen and examined with the above-signed Advanced Practice Provider and/or Housestaff. I personally reviewed laboratory data, imaging studies and relevant notes. I independently examined the patient and formulated the important aspects of the plan. I have edited the note to reflect any of my changes or salient points. I have personally discussed the plan with the patient and/or family.  Had teeth extracted yesterday. She looks  good. Denies CP or SOB. Weight down. Back on heparin without bleeding.  General:  Well appearing. No resp difficulty HEENT: normal + teeth extracted Neck: supple. no JVD. Carotids 2+ bilat; no bruits. No lymphadenopathy or thryomegaly appreciated. Cor: PMI nondisplaced. Irregular rate & rhythm. 2/6 MR Lungs: clear Abdomen: soft, nontender, nondistended. No hepatosplenomegaly. No bruits or masses. Good bowel sounds. Extremities: no cyanosis, clubbing, rash, edema Neuro: alert & orientedx3, cranial nerves grossly intact. moves all 4 extremities w/o difficulty. Affect pleasant  Scheduled for MVR +/- TVR + Maze on Monday. Plan RHC later today to assess need for pre-op milrinone for support. Continue heparin for AF. D/w Dr. Roxy Manns.   Glori Bickers, MD  11:54 AM

## 2020-10-26 LAB — BASIC METABOLIC PANEL
Anion gap: 11 (ref 5–15)
BUN: 17 mg/dL (ref 8–23)
CO2: 24 mmol/L (ref 22–32)
Calcium: 8.6 mg/dL — ABNORMAL LOW (ref 8.9–10.3)
Chloride: 99 mmol/L (ref 98–111)
Creatinine, Ser: 1.18 mg/dL — ABNORMAL HIGH (ref 0.44–1.00)
GFR, Estimated: 49 mL/min — ABNORMAL LOW (ref >60)
Glucose, Bld: 113 mg/dL — ABNORMAL HIGH (ref 70–99)
Potassium: 3.5 mmol/L (ref 3.5–5.1)
Sodium: 134 mmol/L — ABNORMAL LOW (ref 135–145)

## 2020-10-26 LAB — CBC
HCT: 28.3 % — ABNORMAL LOW (ref 36.0–46.0)
Hemoglobin: 9.4 g/dL — ABNORMAL LOW (ref 12.0–15.0)
MCH: 26.9 pg (ref 26.0–34.0)
MCHC: 33.2 g/dL (ref 30.0–36.0)
MCV: 81.1 fL (ref 80.0–100.0)
Platelets: 165 10*3/uL (ref 150–400)
RBC: 3.49 MIL/uL — ABNORMAL LOW (ref 3.87–5.11)
RDW: 14.9 % (ref 11.5–15.5)
WBC: 8.2 10*3/uL (ref 4.0–10.5)
nRBC: 0 % (ref 0.0–0.2)

## 2020-10-26 LAB — HEPARIN LEVEL (UNFRACTIONATED): Heparin Unfractionated: 0.3 IU/mL (ref 0.30–0.70)

## 2020-10-26 MED ORDER — POLYETHYLENE GLYCOL 3350 17 G PO PACK
17.0000 g | PACK | Freq: Every day | ORAL | Status: DC
  Administered 2020-10-26 – 2020-10-27 (×2): 17 g via ORAL
  Filled 2020-10-26 (×2): qty 1

## 2020-10-26 MED ORDER — SENNOSIDES-DOCUSATE SODIUM 8.6-50 MG PO TABS
1.0000 | ORAL_TABLET | Freq: Once | ORAL | Status: AC
  Administered 2020-10-26: 1 via ORAL
  Filled 2020-10-26: qty 1

## 2020-10-26 NOTE — Progress Notes (Addendum)
Advanced Heart Failure Rounding Note  PCP-Cardiologist: Dr. Gwenlyn Found AHF: Dr. [name]   Patient Profile   [name], followed by Dr. Gwenlyn Found, w/ h/o chronic diastolic heart failure w/ predominant RV dysfunction + pulmonary hypertension, chronic atrial fibrillation on Eliquis, severe mitral regurgitation, systemic HTN and rheumatoid arthritis on Methotrexate, being evaluated for mitral valve repair vs replacement.   Subjective:    5/5 Underwent upper teeth extraction 5/6 RHC -> started on milrinone for elevated PA pressures and elevated PVR.   Feels good. Denies SOB, orthopnea or PND.   RHC 5/6 RA = 4 RV = 74/5 PA = 74/18 (39) PCW = 13 (v = 25) Fick cardiac output/index = 5.4/3.2 PVR = 4.8 WU Ao sat = 99% PA sat = 69%, 72%   Objective:   Weight Range: 66.9 kg Body mass index is 25.32 kg/m.   Vital Signs:   Temp:  [97.4 F (36.3 C)-98.6 F (37 C)] 97.4 F (36.3 C) (05/07 0507) Pulse Rate:  [45-68] 52 (05/07 0951) Resp:  [0-18] 16 (05/07 0951) BP: (106-153)/(46-81) 119/54 (05/07 0951) SpO2:  [93 %-100 %] 98 % (05/07 0951) Weight:  [66.9 kg] 66.9 kg (05/07 0507) Last BM Date: 10/24/20  Weight change: Filed Weights   10/24/20 0400 10/25/20 0434 10/26/20 0507  Weight: 68.4 kg 67.4 kg 66.9 kg    Intake/Output:   Intake/Output Summary (Last 24 hours) at 10/26/2020 1043 Last data filed at 10/26/2020 0838 Gross per 24 hour  Intake 719.62 ml  Output 2175 ml  Net -1455.38 ml      Physical Exam    General:  Well appearing. No resp difficulty HEENT: normal Neck: supple. no JVD. Carotids 2+ bilat; no bruits. No lymphadenopathy or thryomegaly appreciated. Cor: PMI nondisplaced. Irregular rate & rhythm. 2/6 MR Lungs: clear Abdomen: soft, nontender, nondistended. No hepatosplenomegaly. No bruits or masses. Good bowel sounds. Extremities: no cyanosis, clubbing, rash, edema Neuro: alert & orientedx3, cranial nerves grossly intact. moves all 4 extremities w/o  difficulty. Affect pleasant    Telemetry   Chronic  AF 50-60s Personally reviewed   Labs    CBC Recent Labs    10/25/20 0513 10/25/20 1347 10/25/20 1349 10/26/20 0258  WBC 7.8  --   --  8.2  HGB 11.1*   < > 12.2 9.4*  HCT 32.7*   < > 36.0 28.3*  MCV 79.8*  --   --  81.1  PLT 193  --   --  165   < > = values in this interval not displayed.   Basic Metabolic Panel Recent Labs    10/23/20 1134 10/23/20 1134 10/24/20 0258 10/25/20 0513 10/25/20 1347 10/25/20 1349 10/26/20 0258  NA  --    < > 134* 131*   < > 137 134*  K 3.2*  --  4.5 4.3   < > 3.8 3.5  CL  --    < > 104 99  --   --  99  CO2  --    < > 23 24  --   --  24  GLUCOSE  --    < > 93 123*  --   --  113*  BUN  --    < > 8 12  --   --  17  CREATININE  --    < > 0.90 1.01*  --   --  1.18*  CALCIUM  --    < > 8.7* 9.3  --   --  8.6*  MG 1.4*  --  2.4  --   --   --   --    < > = values in this interval not displayed.   Liver Function Tests No results for input(s): AST, ALT, ALKPHOS, BILITOT, PROT, ALBUMIN in the last 72 hours. No results for input(s): LIPASE, AMYLASE in the last 72 hours. Cardiac Enzymes No results for input(s): CKTOTAL, CKMB, CKMBINDEX, TROPONINI in the last 72 hours.  BNP: BNP (last 3 results) Recent Labs    10/23/20 0006  BNP 490.4*    ProBNP (last 3 results) No results for input(s): PROBNP in the last 8760 hours.   D-Dimer No results for input(s): DDIMER in the last 72 hours. Hemoglobin A1C No results for input(s): HGBA1C in the last 72 hours. Fasting Lipid Panel No results for input(s): CHOL, HDL, LDLCALC, TRIG, CHOLHDL, LDLDIRECT in the last 72 hours. Thyroid Function Tests No results for input(s): TSH, T4TOTAL, T3FREE, THYROIDAB in the last 72 hours.  Invalid input(s): FREET3  Other results:   Imaging    CARDIAC CATHETERIZATION  Result Date: 10/25/2020 Findings: RA = 4 RV = 74/5 PA = 74/18 (39) PCW = 13 (v = 25) Fick cardiac output/index = 5.4/3.2 PVR = 4.8 WU Ao  sat = 99% PA sat = 69%, 72% Assessment/Plan: 1. Much improved hemodynamics but PA pressures and PVR still high. Will start milrinone pre-operatively/ Glori Bickers, MD 2:20 PM     Medications:     Scheduled Medications: . amLODipine  10 mg Oral Daily  . calcium-vitamin D  1 tablet Oral Daily  . cholecalciferol  1,000 Units Oral Daily  . docusate sodium  100 mg Oral Daily  . feeding supplement  1 Container Oral TID BM  . levothyroxine  125 mcg Oral Q0600  . metoprolol tartrate  37.5 mg Oral BID  . multivitamin with minerals  1 tablet Oral Daily  . potassium chloride SA  20 mEq Oral Daily  . ramipril  10 mg Oral BID  . sodium chloride flush  3 mL Intravenous Q12H  . sodium chloride flush  3 mL Intravenous Q12H  . sodium chloride flush  3 mL Intravenous Q12H  . sodium chloride flush  3 mL Intravenous Q12H  . torsemide  40 mg Oral Daily    Infusions: . sodium chloride    . sodium chloride    . heparin 1,000 Units/hr (10/26/20 0744)  . lactated ringers 10 mL/hr at 10/24/20 2109  . milrinone 0.125 mcg/kg/min (10/25/20 1535)    PRN Medications: sodium chloride, sodium chloride, acetaminophen, morphine injection, ondansetron (ZOFRAN) IV, sodium chloride flush, sodium chloride flush    Assessment/Plan   1. Severe Rheumatic Mitral Valve Regurgitation  - Primary MR. LVEF normal 60-65%  - TEE this admit confirmed severe MR. LHC w/ normal coronaries - Not candidate for MitraClip per Dr. Burt Knack  - CT surgery consulted for potential minimally invasive mitral valve repair/replacement - s/p teeth extraction today 5/5 - Plan MVR +/- TVR + Maze for Monday with Dr. Roxy Manns  2. Severe Pulmonary Hypertension  - TTE 8/21 w/ severely elevated RVSP, 78 mmHg. 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 WHO Group 1 2/2 CTD  (RA)  - Repeat RHC on 5/6 with improved hemodynamics but PA pressures and PVR still elevated. Milrinone added  3. Acute on Chronic Diastolic Heart Failure w/ Predominant RV Dysfunction  - 2D Echo 8/21 showed normal LVEF, 60-65%, w/ mild LVH and G3DD c/w restrictive physiology. RV was mildly reduced at the time - TEE 10/21/20 LVEF normal. RV systolic function is now moderately reduced w/ mild-mod TR, in setting of progressive/ now severe MR. -Volume status stable. Continue torsemide. Renal function stable.  - Continue ? blocker and home antihypertensives for BP/HR control   4. Chronic Afib: in setting of severe MR w/ severe LAE. - rate controlled w/ metoprolol  - Eliquis currently on hold.  - Covering w/ heparin until invasive procedures are completed - hgb 11 -> 9.4. No overt bleeding. Follow closely   5. Rheumatoid Arthritis - on MTX - followed by rheumatology   6. Hyponatremia - Sodium 134 today   Length of Stay: 3  Glori Bickers, MD  10/26/2020, 10:43 AM  Advanced Heart Failure Team Pager [phone] (M-F; 7a - 5p)  Please contact [facility] for night-coverage after hours (5p -7a ) and weekends on [email or web address]

## 2020-10-26 NOTE — Progress Notes (Signed)
[place] for heparin Indication: atrial fibrillation  Allergies  Allergen Reactions  . Metronidazole Itching and Rash    Patient Measurements: Height: _0  (162.6 cm) Weight: 66.9 kg (147 lb 8 oz) IBW/kg (Calculated) : 54.7 Heparin Dosing Weight: 67kg  Vital Signs: Temp: 97.4 F (36.3 C) (05/07 0507) Temp Source: Oral (05/07 0507) BP: 119/51 (05/07 0507) Pulse Rate: 45 (05/07 0507)  Labs: Recent Labs    10/24/20 0258 10/25/20 0513 10/25/20 1347 10/25/20 1349 10/26/20 0258  HGB 10.3* 11.1* 11.6* 12.2 9.4*  HCT 30.6* 32.7* 34.0* 36.0 28.3*  PLT 165 193  --   --  165  HEPARINUNFRC 0.11* 0.39  --   --  0.30  CREATININE 0.90 1.01*  --   --  1.18*    Estimated Creatinine Clearance: 40 mL/min (A) (by C-G formula based on SCr of 1.18 mg/dL (H)).   Medical History: Past Medical History:  Diagnosis Date  . Atrial fibrillation with RVR ([place]) 2014   persistent  . Chronic diastolic CHF (congestive heart failure) (H[facility])    HTN  . Chronic diastolic congestive heart failure ([place])   . COVID-19 virus infection 01/20/2019  . ESOPHAGEAL STRICTURE 02/19/2005   Qualifier: Diagnosis of  By: Amil Amen MD, Benjamine Mola    . Goiter   . History of anemia    unclear cause, now resolved  . History of chicken pox   . History of colon polyps    benign  . HLD (hyperlipidemia)   . Hypothyroidism   . Malignant hypertension longstanding  . Microcytosis   . Mitral valve regurgitation   . Osteopenia 12/2013   T -2.3 hip  . Osteoporosis   . Pulmonary hypertension ([place])   . Rheumatoid arthritis ([place]) 11/2015   +RF, +CCP, ESR 49, synovitis on exam and [place] 11/2015 [facility])  . Right lower lobe pneumonia 07/24/2014  . Tricuspid regurgitation   . Vitamin D deficiency      Assessment: 52 yoF admitted with LHC and TEE found to have severe mitral regurgitation with plans for MVR. Pt on apixaban PTA for hx AFib (last dose 4/29), pharmacy to start IV heparin  perioperatively. Patient is now s/p cardiac cath 5/6, heparin was resumed 2h post sheath removal.   Heparin level is therapeutic at 0.30 but at lower end of goal on 950 units/hr. Hgb 9.4, plt 165. No s/sx of bleeding or infusion issues per discussion with RN. Planning MVR +/- TVR + Maze on 5/9.  Goal of Therapy:  Heparin level 0.3-0.7 units/ml Monitor platelets by anticoagulation protocol: Yes   Plan:  Slight inc heparin infusion to 1000 units/hr Daily HL, CBC, monitor for s/sx of bleeding  Romilda Garret, PharmD PGY1 Acute Care Pharmacy Resident 10/26/2020 7:20 AM  Please check [email or web address] for unit specific pharmacy phone numbers.

## 2020-10-27 DIAGNOSIS — I340 Nonrheumatic mitral (valve) insufficiency: Secondary | ICD-10-CM | POA: Diagnosis not present

## 2020-10-27 DIAGNOSIS — I5032 Chronic diastolic (congestive) heart failure: Secondary | ICD-10-CM

## 2020-10-27 DIAGNOSIS — R0600 Dyspnea, unspecified: Secondary | ICD-10-CM

## 2020-10-27 LAB — CBC
HCT: 29.3 % — ABNORMAL LOW (ref 36.0–46.0)
Hemoglobin: 9.8 g/dL — ABNORMAL LOW (ref 12.0–15.0)
MCH: 27 pg (ref 26.0–34.0)
MCHC: 33.4 g/dL (ref 30.0–36.0)
MCV: 80.7 fL (ref 80.0–100.0)
Platelets: 174 10*3/uL (ref 150–400)
RBC: 3.63 MIL/uL — ABNORMAL LOW (ref 3.87–5.11)
RDW: 14.9 % (ref 11.5–15.5)
WBC: 6.6 10*3/uL (ref 4.0–10.5)
nRBC: 0 % (ref 0.0–0.2)

## 2020-10-27 LAB — COMPREHENSIVE METABOLIC PANEL
ALT: 25 U/L (ref 0–44)
AST: 29 U/L (ref 15–41)
Albumin: 2.8 g/dL — ABNORMAL LOW (ref 3.5–5.0)
Alkaline Phosphatase: 65 U/L (ref 38–126)
Anion gap: 9 (ref 5–15)
BUN: 14 mg/dL (ref 8–23)
CO2: 25 mmol/L (ref 22–32)
Calcium: 8.8 mg/dL — ABNORMAL LOW (ref 8.9–10.3)
Chloride: 100 mmol/L (ref 98–111)
Creatinine, Ser: 1.16 mg/dL — ABNORMAL HIGH (ref 0.44–1.00)
GFR, Estimated: 50 mL/min — ABNORMAL LOW (ref >60)
Glucose, Bld: 97 mg/dL (ref 70–99)
Potassium: 3.7 mmol/L (ref 3.5–5.1)
Sodium: 134 mmol/L — ABNORMAL LOW (ref 135–145)
Total Bilirubin: 0.7 mg/dL (ref 0.3–1.2)
Total Protein: 5.7 g/dL — ABNORMAL LOW (ref 6.5–8.1)

## 2020-10-27 LAB — PREALBUMIN: Prealbumin: 12.6 mg/dL — ABNORMAL LOW (ref 18–38)

## 2020-10-27 LAB — HEPARIN LEVEL (UNFRACTIONATED): Heparin Unfractionated: 0.38 IU/mL (ref 0.30–0.70)

## 2020-10-27 LAB — ABO/RH: ABO/RH(D): O POS

## 2020-10-27 LAB — BRAIN NATRIURETIC PEPTIDE: B Natriuretic Peptide: 149.3 pg/mL — ABNORMAL HIGH (ref 0.0–100.0)

## 2020-10-27 MED ORDER — CHLORHEXIDINE GLUCONATE 4 % EX LIQD
60.0000 mL | Freq: Once | CUTANEOUS | Status: AC
  Filled 2020-10-27: qty 60

## 2020-10-27 MED ORDER — TRANEXAMIC ACID (OHS) PUMP PRIME SOLUTION
2.0000 mg/kg | INTRAVENOUS | Status: DC
  Filled 2020-10-27: qty 1.33

## 2020-10-27 MED ORDER — MILRINONE LACTATE IN DEXTROSE 20-5 MG/100ML-% IV SOLN
0.3000 ug/kg/min | INTRAVENOUS | Status: DC
  Filled 2020-10-27: qty 100

## 2020-10-27 MED ORDER — CEFAZOLIN SODIUM-DEXTROSE 2-4 GM/100ML-% IV SOLN
2.0000 g | INTRAVENOUS | Status: AC
  Administered 2020-10-28: 2 g via INTRAVENOUS
  Filled 2020-10-27: qty 100

## 2020-10-27 MED ORDER — VANCOMYCIN HCL 1000 MG IV SOLR
INTRAVENOUS | Status: DC
  Filled 2020-10-27: qty 1000

## 2020-10-27 MED ORDER — MANNITOL 20 % IV SOLN
Freq: Once | INTRAVENOUS | Status: DC
  Filled 2020-10-27: qty 13

## 2020-10-27 MED ORDER — PLASMA-LYTE 148 IV SOLN
INTRAVENOUS | Status: DC
  Filled 2020-10-27: qty 2.5

## 2020-10-27 MED ORDER — GLUTARALDEHYDE 0.625% SOAKING SOLUTION
TOPICAL | Status: DC
  Filled 2020-10-27: qty 50

## 2020-10-27 MED ORDER — TRANEXAMIC ACID (OHS) BOLUS VIA INFUSION
15.0000 mg/kg | INTRAVENOUS | Status: AC
  Administered 2020-10-28: 1000.5 mg via INTRAVENOUS
  Filled 2020-10-27: qty 1001

## 2020-10-27 MED ORDER — TRANEXAMIC ACID 1000 MG/10ML IV SOLN
1.5000 mg/kg/h | INTRAVENOUS | Status: AC
  Administered 2020-10-28: 1.5 mg/kg/h via INTRAVENOUS
  Filled 2020-10-27: qty 25

## 2020-10-27 MED ORDER — NOREPINEPHRINE 4 MG/250ML-% IV SOLN
0.0000 ug/min | INTRAVENOUS | Status: DC
  Filled 2020-10-27: qty 250

## 2020-10-27 MED ORDER — CHLORHEXIDINE GLUCONATE 4 % EX LIQD
60.0000 mL | Freq: Once | CUTANEOUS | Status: DC
  Filled 2020-10-27: qty 60

## 2020-10-27 MED ORDER — VANCOMYCIN HCL 1250 MG/250ML IV SOLN
1250.0000 mg | INTRAVENOUS | Status: AC
  Administered 2020-10-28: 1250 mg via INTRAVENOUS
  Filled 2020-10-27: qty 250

## 2020-10-27 MED ORDER — CEFAZOLIN SODIUM-DEXTROSE 2-4 GM/100ML-% IV SOLN
2.0000 g | INTRAVENOUS | Status: DC
  Filled 2020-10-27: qty 100

## 2020-10-27 MED ORDER — EPINEPHRINE HCL 5 MG/250ML IV SOLN IN NS
0.0000 ug/min | INTRAVENOUS | Status: DC
  Filled 2020-10-27: qty 250

## 2020-10-27 MED ORDER — CHLORHEXIDINE GLUCONATE 0.12 % MT SOLN
15.0000 mL | Freq: Once | OROMUCOSAL | Status: AC
  Administered 2020-10-28: 15 mL via OROMUCOSAL
  Filled 2020-10-27: qty 15

## 2020-10-27 MED ORDER — DEXMEDETOMIDINE HCL IN NACL 400 MCG/100ML IV SOLN
0.1000 ug/kg/h | INTRAVENOUS | Status: AC
  Administered 2020-10-28: .7 ug/kg/h via INTRAVENOUS
  Filled 2020-10-27: qty 100

## 2020-10-27 MED ORDER — METOPROLOL TARTRATE 25 MG PO TABS
25.0000 mg | ORAL_TABLET | Freq: Two times a day (BID) | ORAL | Status: DC
  Administered 2020-10-27 (×2): 25 mg via ORAL
  Filled 2020-10-27 (×3): qty 1

## 2020-10-27 MED ORDER — MANNITOL 20 % IV SOLN: Freq: Once | INTRAVENOUS | Status: DC

## 2020-10-27 MED ORDER — BISACODYL 5 MG PO TBEC
5.0000 mg | DELAYED_RELEASE_TABLET | Freq: Once | ORAL | Status: AC
  Administered 2020-10-27: 5 mg via ORAL
  Filled 2020-10-27: qty 1

## 2020-10-27 MED ORDER — PHENYLEPHRINE HCL-NACL 20-0.9 MG/250ML-% IV SOLN
30.0000 ug/min | INTRAVENOUS | Status: AC
  Administered 2020-10-28: 20 ug/min via INTRAVENOUS
  Filled 2020-10-27: qty 250

## 2020-10-27 MED ORDER — SODIUM CHLORIDE 0.9 % IV SOLN
INTRAVENOUS | Status: DC
  Filled 2020-10-27: qty 30

## 2020-10-27 MED ORDER — NITROGLYCERIN IN D5W 200-5 MCG/ML-% IV SOLN
2.0000 ug/min | INTRAVENOUS | Status: DC
  Filled 2020-10-27: qty 250

## 2020-10-27 MED ORDER — TEMAZEPAM 15 MG PO CAPS
15.0000 mg | ORAL_CAPSULE | Freq: Once | ORAL | Status: DC | PRN
  Filled 2020-10-27: qty 1

## 2020-10-27 MED ORDER — POTASSIUM CHLORIDE 2 MEQ/ML IV SOLN
80.0000 meq | INTRAVENOUS | Status: DC
  Filled 2020-10-27: qty 40

## 2020-10-27 MED ORDER — INSULIN REGULAR(HUMAN) IN NACL 100-0.9 UT/100ML-% IV SOLN
INTRAVENOUS | Status: AC
  Administered 2020-10-28: .8 [IU]/h via INTRAVENOUS
  Filled 2020-10-27: qty 100

## 2020-10-27 NOTE — Plan of Care (Signed)
  Problem: Activity: Goal: Risk for activity intolerance will decrease Outcome: Completed/Met   Problem: Nutrition: Goal: Adequate nutrition will be maintained Outcome: Completed/Met   Problem: Coping: Goal: Level of anxiety will decrease Outcome: Completed/Met   Problem: Elimination: Goal: Will not experience complications related to bowel motility Outcome: Progressing   Problem: Elimination: Goal: Will not experience complications related to urinary retention Outcome: Completed/Met   Problem: Pain Managment: Goal: General experience of comfort will improve Outcome: Completed/Met   Problem: Safety: Goal: Ability to remain free from injury will improve Outcome: Completed/Met   Problem: Skin Integrity: Goal: Risk for impaired skin integrity will decrease Outcome: Completed/Met

## 2020-10-27 NOTE — Anesthesia Preprocedure Evaluation (Addendum)
Anesthesia Evaluation  Patient identified by MRN, date of birth, ID band Patient awake    Reviewed: Allergy & Precautions, NPO status , Patient's Chart, lab work & pertinent test results, reviewed documented beta blocker date and time   Airway Mallampati: II  TM Distance: >3 FB Neck ROM: Full    Dental  (+) Dental Advisory Given, Missing, Poor Dentition   Pulmonary shortness of breath, former smoker,    Pulmonary exam normal breath sounds clear to auscultation       Cardiovascular hypertension, Pt. on medications and Pt. on home beta blockers +CHF  + dysrhythmias Atrial Fibrillation Valvular problems/murmurs: TR. MR  Rhythm:Regular Rate:Normal + Systolic murmurs Echo 01/23/08: 1. Left ventricular ejection fraction, by estimation, is 55 to 60%. The  left ventricle has normal function.  2. Right ventricular systolic function is moderately reduced. The right  ventricular size is normal.  3. Left atrial size was severely dilated. No left atrial/left atrial  appendage thrombus was detected.  4. The mitral valve is abnormal. Severe mitral valve regurgitation. There  is mild holosystolic prolapse of both leaflets of the mitral valve.  5. Tricuspid valve regurgitation is moderate.  6. The aortic valve is tricuspid. Aortic valve regurgitation is not  visualized.  7. There is mild (Grade II) plaque involving the descending aorta.   Neuro/Psych negative neurological ROS  negative psych ROS   GI/Hepatic negative GI ROS, Neg liver ROS,   Endo/Other  Hypothyroidism   Renal/GU Renal InsufficiencyRenal disease     Musculoskeletal  (+) Arthritis , Rheumatoid disorders,    Abdominal   Peds  Hematology  (+) Blood dyscrasia (Eliquis), anemia ,   Anesthesia Other Findings   Reproductive/Obstetrics                            Anesthesia Physical Anesthesia Plan  ASA: IV  Anesthesia Plan: General    Post-op Pain Management:    Induction: Intravenous  PONV Risk Score and Plan: 3 and Midazolam and Treatment may vary due to age or medical condition  Airway Management Planned: Oral ETT  Additional Equipment: Arterial line, CVP, PA Cath, TEE, 3D TEE and Ultrasound Guidance Line Placement  Intra-op Plan:   Post-operative Plan: Post-operative intubation/ventilation  Informed Consent: I have reviewed the patients History and Physical, chart, labs and discussed the procedure including the risks, benefits and alternatives for the proposed anesthesia with the patient or authorized representative who has indicated his/her understanding and acceptance.     Dental advisory given  Plan Discussed with: CRNA  Anesthesia Plan Comments:        Anesthesia Quick Evaluation

## 2020-10-27 NOTE — Progress Notes (Signed)
[address]       ,[place] [address]             [phone]     CARDIOTHORACIC SURGERY PROGRESS NOTE  2 Days Post-Op  S/P Procedure(s) (LRB): RIGHT HEART CATH (N/A)  Subjective: Looks good.  No complaints.  Denies SOB  Objective: Vital signs in last 24 hours: Temp:  [98 F (36.7 C)-98.7 F (37.1 C)] 98.5 F (36.9 C) (05/08 0508) Pulse Rate:  [48-56] 48 (05/08 0508) Cardiac Rhythm: Atrial fibrillation (05/08 0703) Resp:  [19] 19 (05/08 0508) BP: (116-135)/(52-71) 134/71 (05/08 1005) SpO2:  [94 %-97 %] 97 % (05/08 0508) Weight:  [66.7 kg] 66.7 kg (05/08 0508)  Physical Exam:  Rhythm:   Afib  Breath sounds: clear  Heart sounds:  Irregular w/ systolic murmur  Incisions:  n/a  Abdomen:  soft  Extremities:  Warm, no edema   Intake/Output from previous day: 05/07 0701 - 05/08 0700 In: 1122.4 [P.O.:720; I.V.:402.4] Out: 2200 [Urine:2200] Intake/Output this shift: Total I/O In: 95.6 [I.V.:95.6] Out: 700 [Urine:700]  Lab Results: Recent Labs    10/26/20 0258 10/27/20 0155  WBC 8.2 6.6  HGB 9.4* 9.8*  HCT 28.3* 29.3*  PLT 165 174   BMET:  Recent Labs    10/26/20 0258 10/27/20 0155  NA 134* 134*  K 3.5 3.7  CL 99 100  CO2 24 25  GLUCOSE 113* 97  BUN 17 14  CREATININE 1.18* 1.16*  CALCIUM 8.6* 8.8*    CBG (last 3)  No results for input(s): GLUCAP in the last 72 hours. PT/INR:  No results for input(s): LABPROT, INR in the last 72 hours.  CXR:  N/A  Assessment/Plan: S/P Procedure(s) (LRB): RIGHT HEART CATH (N/A)  The patient was again counseled at length regarding the indications, risks, and potential benefits of mitral valve repair or replacement for treatment of her severe symptomatic primary mitral regurgitation.  The likelihood of successful and durable mitral valve repair has been discussed with particular reference to the findings of the most recent echocardiogram.  Based upon these findings and previous experience, I have  quoted a less than 50 percent likelihood of successful valve repair with less than 5 percent risk of mortality or major morbidity.  In the event that the patient's valve cannot be successfully repaired, we discussed the possibility of replacing the mitral valve using a mechanical prosthesis with the attendant need for long-term anticoagulation using warfarin versus the alternative of replacing it using a bioprosthetic tissue valve with its potential for late structural valve deterioration and failure, depending upon the patient's longevity.  The patient specifically requests that if the mitral valve must be replaced that it be done using a bioprosthetic tissue valve.   The rationale for concomitant clipping of left atrial appendage with or without possible tricuspid valve repair were discussed.  She understands and accepts all potential risks of surgery including but not limited to risk of death, stroke or other neurologic complication, myocardial infarction, congestive heart failure, respiratory failure, renal failure, bleeding requiring transfusion and/or reexploration, arrhythmia, heart block or bradycardia requiring permanent pacemaker insertion, infection or other wound complications, pneumonia, pleural and/or pericardial effusion, pulmonary embolus, aortic dissection or other major vascular complication, or other immediate or delayed complications related to valve repair or replacement including but not limited to recurrent or persistent mitral regurgitation and/or mitral stenosis, LV outflow tract obstruction, aortic insufficiency, paravalvular leak, posterior AV groove disruption, structural valve deterioration and failure, thrombosis,  embolization, or endocarditis.  All of her questions have been answered.  For OR tomorrow.   Rexene Alberts, MD 10/27/2020 11:15 AM

## 2020-10-27 NOTE — Progress Notes (Signed)
[place] for heparin Indication: atrial fibrillation  Allergies  Allergen Reactions  . Metronidazole Itching and Rash    Patient Measurements: Height: _0  (162.6 cm) Weight: 66.7 kg (147 lb 0.8 oz) IBW/kg (Calculated) : 54.7 Heparin Dosing Weight: 67kg  Vital Signs: Temp: 98.5 F (36.9 C) (05/08 0508) Temp Source: Oral (05/08 0508) BP: 116/63 (05/08 0508) Pulse Rate: 48 (05/08 0508)  Labs: Recent Labs    10/25/20 0513 10/25/20 1347 10/25/20 1349 10/26/20 0258 10/27/20 0155  HGB 11.1*   < > 12.2 9.4* 9.8*  HCT 32.7*   < > 36.0 28.3* 29.3*  PLT 193  --   --  165 174  HEPARINUNFRC 0.39  --   --  0.30 0.38  CREATININE 1.01*  --   --  1.18* 1.16*   < > = values in this interval not displayed.    Estimated Creatinine Clearance: 40.6 mL/min (A) (by C-G formula based on SCr of 1.16 mg/dL (H)).   Medical History: Past Medical History:  Diagnosis Date  . Atrial fibrillation with RVR ([place]) 2014   persistent  . Chronic diastolic CHF (congestive heart failure) (HCC)    HTN  . Chronic diastolic congestive heart failure ([place])   . COVID-19 virus infection 01/20/2019  . ESOPHAGEAL STRICTURE 02/19/2005   Qualifier: Diagnosis of  By: Amil Amen MD, Benjamine Mola    . Goiter   . History of anemia    unclear cause, now resolved  . History of chicken pox   . History of colon polyps    benign  . HLD (hyperlipidemia)   . Hypothyroidism   . Malignant hypertension longstanding  . Microcytosis   . Mitral valve regurgitation   . Osteopenia 12/2013   T -2.3 hip  . Osteoporosis   . Pulmonary hypertension ([place])   . Rheumatoid arthritis ([place]) 11/2015   +RF, +CCP, ESR 49, synovitis on exam and [place] 11/2015 [facility])  . Right lower lobe pneumonia 07/24/2014  . Tricuspid regurgitation   . Vitamin D deficiency      Assessment: 84 yoF admitted with LHC and TEE found to have severe mitral regurgitation with plans for MVR. Pt on apixaban PTA for hx AFib  (last dose 4/29), pharmacy to start IV heparin perioperatively. Patient is now s/p cardiac cath 5/6, heparin was resumed 2h post sheath removal.   Heparin level is therapeutic at 0.38 on 1000 units/hr. Hgb stable in 9s, PLTs WNL. No s/sx of bleeding or infusion issues. Planning MVR +/- TVR + Maze on 5/9.  Goal of Therapy:  Heparin level 0.3-0.7 units/ml Monitor platelets by anticoagulation protocol: Yes   Plan:  Heparin 1000 units/hr Daily HL, CBC, monitor for s/sx of bleeding  Romilda Garret, PharmD PGY1 Acute Care Pharmacy Resident 10/27/2020 7:41 AM  Please check [email or web address] for unit specific pharmacy phone numbers.

## 2020-10-27 NOTE — Progress Notes (Addendum)
Cardiology Rounding Note  PCP-Cardiologist: Joanna Hood. Joanna Hood Hood AHF: Joanna Hood. Joanna Hood Hood   Patient Profile   [name], followed by Joanna Hood. Joanna Hood Hood, w/ h/o chronic diastolic heart failure w/ predominant RV dysfunction + pulmonary hypertension, chronic atrial fibrillation on Eliquis, severe mitral regurgitation, systemic HTN and rheumatoid arthritis on Methotrexate, being evaluated for mitral valve repair vs replacement >> replacement planned 05/09.  Subjective:    5/5 Underwent upper teeth extraction 5/6 RHC -> started on milrinone for elevated PA pressures and elevated PVR.   05/08: feels well, no SOB, aware that she is breathing better w/ a lot of fluid off. Ready for surgery  RHC 5/6 RA = 4 RV = 74/5 PA = 74/18 (39) PCW = 13 (v = 25) Fick cardiac output/index = 5.4/3.2 PVR = 4.8 WU Ao sat = 99% PA sat = 69%, 72%  CARDIAC CATH: 10/21/2020 IMPRESSION: Joanna Hood Hood has severe bileaflet prolapse with severe MR, significantly elevated V wave and pulmonary hypertension.  She has normal LV function by 2D echo/TEE performed today.  Cardiac cath revealed normal coronary arteries.  She is symptomatic.  A right femoral angiogram was performed and a Mynx closure device was successfully deployed achieving hemostasis.  The right radial sheath was removed and a TR band was placed to achieve patent hemostasis.  The patient did receive 40 mg of IV furosemide at the end of the case.  She left the lab in stable condition.  She will start her Eliquis again tomorrow morning.  She has chronic A. fib.  I will see her back next week.  In the meantime, I have communicated these results to Joanna Hood. Joanna Hood Hood , cardiothoracic surgery in anticipation of potential minimally invasive mitral valve repair.  TEE: 10/21/2020 1. Left ventricular ejection fraction, by estimation, is 55 to 60%. The  left ventricle has normal function.  2. Right ventricular systolic function is moderately reduced. The right  ventricular size is normal.  3.  Left atrial size was severely dilated. No left atrial/left atrial  appendage thrombus was detected.  4. The mitral valve is abnormal. Severe mitral valve regurgitation. There  is mild holosystolic prolapse of both leaflets of the mitral valve.  5. Tricuspid valve regurgitation is moderate.  6. The aortic valve is tricuspid. Aortic valve regurgitation is not  visualized.  7. There is mild (Grade II) plaque involving the descending aorta.    Objective:   Weight Range: 66.7 kg Body mass index is 25.24 kg/m.   Vital Signs:   Temp:  [98 F (36.7 C)-98.7 F (37.1 C)] 98.5 F (36.9 C) (05/08 0508) Pulse Rate:  [48-56] 48 (05/08 0508) Resp:  [16-19] 19 (05/08 0508) BP: (116-135)/(52-63) 116/63 (05/08 0508) SpO2:  [94 %-98 %] 97 % (05/08 0508) Weight:  [66.7 kg] 66.7 kg (05/08 0508) Last BM Date: 10/24/20  Weight change: Filed Weights   10/25/20 0434 10/26/20 0507 10/27/20 0508  Weight: 67.4 kg 66.9 kg 66.7 kg    Intake/Output:   Intake/Output Summary (Last 24 hours) at 10/27/2020 0818 Last data filed at 10/27/2020 0715 Gross per 24 hour  Intake 1138.14 ml  Output 2200 ml  Net -1061.86 ml      Physical Exam    GEN: WD, WN female, No acute distress.   Neck: No JVD Cardiac: RRR, 2/6 murmur, no rubs, or gallops.  Respiratory: clear to auscultation bilaterally  GI: Soft, nontender, non-distended  MS: No edema; No deformity. Neuro:  Nonfocal  Psych: Normal affect   Telemetry   Atrial fib,  HR normal when awake, drops to high 30s sustained while asleep - Personally reviewed   Labs    CBC Recent Labs    10/26/20 0258 10/27/20 0155  WBC 8.2 6.6  HGB 9.4* 9.8*  HCT 28.3* 29.3*  MCV 81.1 80.7  PLT 165 161   Basic Metabolic Panel Recent Labs    10/26/20 0258 10/27/20 0155  NA 134* 134*  K 3.5 3.7  CL 99 100  CO2 24 25  GLUCOSE 113* 97  BUN 17 14  CREATININE 1.18* 1.16*  CALCIUM 8.6* 8.8*   Liver Function Tests Recent Labs    10/27/20 0155  AST  29  ALT 25  ALKPHOS 65  BILITOT 0.7  PROT 5.7*  ALBUMIN 2.8*    Cardiac Enzymes No results for input(s): CKTOTAL, CKMB, CKMBINDEX, TROPONINI in the last 72 hours.  BNP: BNP (last 3 results) Recent Labs    10/23/20 0006 10/27/20 0155  BNP 490.4* 149.3*    ProBNP (last 3 results) No results for input(s): PROBNP in the last 8760 hours.   D-Dimer No results for input(s): DDIMER in the last 72 hours. Hemoglobin A1C No results for input(s): HGBA1C in the last 72 hours. Fasting Lipid Panel No results for input(s): CHOL, HDL, LDLCALC, TRIG, CHOLHDL, LDLDIRECT in the last 72 hours. Thyroid Function Tests No results for input(s): TSH, T4TOTAL, T3FREE, THYROIDAB in the last 72 hours.  Invalid input(s): FREET3  Other results:   Imaging    No results Hood.   Medications:     Scheduled Medications: . amLODipine  10 mg Oral Daily  . calcium-vitamin D  1 tablet Oral Daily  . cholecalciferol  1,000 Units Oral Daily  . docusate sodium  100 mg Oral Daily  . feeding supplement  1 Container Oral TID BM  . levothyroxine  125 mcg Oral Q0600  . metoprolol tartrate  37.5 mg Oral BID  . multivitamin with minerals  1 tablet Oral Daily  . polyethylene glycol  17 g Oral Daily  . potassium chloride SA  20 mEq Oral Daily  . ramipril  10 mg Oral BID  . sodium chloride flush  3 mL Intravenous Q12H  . sodium chloride flush  3 mL Intravenous Q12H  . sodium chloride flush  3 mL Intravenous Q12H  . sodium chloride flush  3 mL Intravenous Q12H  . torsemide  40 mg Oral Daily    Infusions: . sodium chloride    . sodium chloride    . heparin 1,000 Units/hr (10/27/20 0156)  . lactated ringers 10 mL/hr at 10/24/20 2109  . milrinone 0.125 mcg/kg/min (10/27/20 0715)    PRN Medications: sodium chloride, sodium chloride, acetaminophen, morphine injection, ondansetron (ZOFRAN) IV, sodium chloride flush, sodium chloride flush    Assessment/Plan   1. Severe Rheumatic Mitral Valve  Regurgitation  - severe MR w/ prolapse of both leaflets by TEE - Cath w/ nl cors - per Joanna Hood Hood, not candidate for Mitraclip - s/p upper teeth extraction 05/05 - Joanna Hood Joanna Hood Hood following and tentative plan is for MVR +/- TVR on Monday, +/- MAZE procedure  2. 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 Acute on Chronic Diastolic Heart Failure w/ Predominant RV Dysfunction  - TTE 01/2020 w/  nl EF, mild LVH and grade 3 dd >> restrictive physiology, RV mildly reduced - TEE 10/21/2020 w/ nl EF, RV function mod reduced w/ mild-mod TR and severe MR - I/O net neg 6.3 L since admit, wt essentially stable on torsemide 40 mg qd - BP well-controlled but HR sustaining in the 30s while asleep, will reduce metoprolol dose to 25 mg bid and follow  4. Chronic Afib: in setting of severe MR w/ severe LAE. - rate low overnight on metoprolol 37.5 mg bid - will decrease to 25 mg bid - Eliquis >> heparin for the surgery - HGB dropped from 11.1 >> 9.4 but stabilized, now 9.8  5. Rheumatoid Arthritis - on home dose MTX  6. Hyponatremia - Na stable at 134   Length of Stay: Elbert. Quentin Ore, MD, [facility], [facility] Cardiac Electrophysiology   Please contact [facility] for night-coverage after hours (5p -7a ) and weekends on [email or web address]

## 2020-10-27 NOTE — Progress Notes (Signed)
MD, pt's HR drops to the 30-40's while sleeping, pt is on Metoprolol 37.5 2 x day, please address, thanks Arvella Nigh RN.

## 2020-10-28 ENCOUNTER — Inpatient Hospital Stay (HOSPITAL_COMMUNITY): Payer: Medicare HMO

## 2020-10-28 ENCOUNTER — Encounter (HOSPITAL_COMMUNITY)
Admission: RE | Disposition: A | Discharge status: Home / Self Care | Payer: Self-pay | Source: Home / Self Care | Attending: Thoracic Surgery (Cardiothoracic Vascular Surgery)

## 2020-10-28 ENCOUNTER — Inpatient Hospital Stay (HOSPITAL_COMMUNITY): Payer: Medicare HMO | Admitting: Certified Registered Nurse Anesthetist

## 2020-10-28 ENCOUNTER — Encounter (HOSPITAL_COMMUNITY): Payer: Self-pay | Admitting: Internal Medicine

## 2020-10-28 DIAGNOSIS — Z953 Presence of xenogenic heart valve: Secondary | ICD-10-CM

## 2020-10-28 DIAGNOSIS — I340 Nonrheumatic mitral (valve) insufficiency: Secondary | ICD-10-CM | POA: Diagnosis not present

## 2020-10-28 HISTORY — PX: MITRAL VALVE REPAIR: SHX2039

## 2020-10-28 HISTORY — PX: CLIPPING OF ATRIAL APPENDAGE: SHX5773

## 2020-10-28 HISTORY — PX: TEE WITHOUT CARDIOVERSION: SHX5443

## 2020-10-28 HISTORY — DX: Presence of xenogenic heart valve: Z95.3

## 2020-10-28 LAB — POCT I-STAT EG7
Acid-Base Excess: 4 mmol/L — ABNORMAL HIGH (ref 0.0–2.0)
Bicarbonate: 27.8 mmol/L (ref 20.0–28.0)
Calcium, Ion: 1.05 mmol/L — ABNORMAL LOW (ref 1.15–1.40)
HCT: 26 % — ABNORMAL LOW (ref 36.0–46.0)
Hemoglobin: 8.8 g/dL — ABNORMAL LOW (ref 12.0–15.0)
O2 Saturation: 91 %
Potassium: 3.4 mmol/L — ABNORMAL LOW (ref 3.5–5.1)
Sodium: 136 mmol/L (ref 135–145)
TCO2: 29 mmol/L (ref 22–32)
pCO2, Ven: 35.4 mmHg — ABNORMAL LOW (ref 44.0–60.0)
pH, Ven: 7.504 — ABNORMAL HIGH (ref 7.250–7.430)
pO2, Ven: 54 mmHg — ABNORMAL HIGH (ref 32.0–45.0)

## 2020-10-28 LAB — BLOOD GAS, ARTERIAL
Acid-Base Excess: 5.5 mmol/L — ABNORMAL HIGH (ref 0.0–2.0)
Bicarbonate: 29.3 mmol/L — ABNORMAL HIGH (ref 20.0–28.0)
FIO2: 21
O2 Saturation: 90.8 %
Patient temperature: 37
pCO2 arterial: 41.5 mmHg (ref 32.0–48.0)
pH, Arterial: 7.463 — ABNORMAL HIGH (ref 7.350–7.450)
pO2, Arterial: 60.9 mmHg — ABNORMAL LOW (ref 83.0–108.0)

## 2020-10-28 LAB — POCT I-STAT 7, (LYTES, BLD GAS, ICA,H+H)
Acid-Base Excess: 7 mmol/L — ABNORMAL HIGH (ref 0.0–2.0)
Acid-Base Excess: 8 mmol/L — ABNORMAL HIGH (ref 0.0–2.0)
Acid-Base Excess: 4 mmol/L — ABNORMAL HIGH (ref 0.0–2.0)
Acid-Base Excess: 1 mmol/L (ref 0.0–2.0)
Acid-Base Excess: 0 mmol/L (ref 0.0–2.0)
Acid-base deficit: 1 mmol/L (ref 0.0–2.0)
Bicarbonate: 24.8 mmol/L (ref 20.0–28.0)
Bicarbonate: 25.2 mmol/L (ref 20.0–28.0)
Bicarbonate: 26.2 mmol/L (ref 20.0–28.0)
Bicarbonate: 28.2 mmol/L — ABNORMAL HIGH (ref 20.0–28.0)
Bicarbonate: 29.9 mmol/L — ABNORMAL HIGH (ref 20.0–28.0)
Bicarbonate: 30.9 mmol/L — ABNORMAL HIGH (ref 20.0–28.0)
Calcium, Ion: 0.96 mmol/L — ABNORMAL LOW (ref 1.15–1.40)
Calcium, Ion: 1 mmol/L — ABNORMAL LOW (ref 1.15–1.40)
Calcium, Ion: 1.05 mmol/L — ABNORMAL LOW (ref 1.15–1.40)
Calcium, Ion: 1.17 mmol/L (ref 1.15–1.40)
Calcium, Ion: 1.17 mmol/L (ref 1.15–1.40)
Calcium, Ion: 1.26 mmol/L (ref 1.15–1.40)
HCT: 22 % — ABNORMAL LOW (ref 36.0–46.0)
HCT: 23 % — ABNORMAL LOW (ref 36.0–46.0)
HCT: 27 % — ABNORMAL LOW (ref 36.0–46.0)
HCT: 32 % — ABNORMAL LOW (ref 36.0–46.0)
HCT: 33 % — ABNORMAL LOW (ref 36.0–46.0)
HCT: 34 % — ABNORMAL LOW (ref 36.0–46.0)
Hemoglobin: 10.9 g/dL — ABNORMAL LOW (ref 12.0–15.0)
Hemoglobin: 11.2 g/dL — ABNORMAL LOW (ref 12.0–15.0)
Hemoglobin: 11.6 g/dL — ABNORMAL LOW (ref 12.0–15.0)
Hemoglobin: 7.5 g/dL — ABNORMAL LOW (ref 12.0–15.0)
Hemoglobin: 7.8 g/dL — ABNORMAL LOW (ref 12.0–15.0)
Hemoglobin: 9.2 g/dL — ABNORMAL LOW (ref 12.0–15.0)
O2 Saturation: 100 %
O2 Saturation: 100 %
O2 Saturation: 100 %
O2 Saturation: 100 %
O2 Saturation: 98 %
O2 Saturation: 99 %
Patient temperature: 34.8
Patient temperature: 36.7
Patient temperature: 36.9
Potassium: 3.1 mmol/L — ABNORMAL LOW (ref 3.5–5.1)
Potassium: 3.5 mmol/L (ref 3.5–5.1)
Potassium: 3.6 mmol/L (ref 3.5–5.1)
Potassium: 3.7 mmol/L (ref 3.5–5.1)
Potassium: 3.9 mmol/L (ref 3.5–5.1)
Potassium: 4.9 mmol/L (ref 3.5–5.1)
Sodium: 135 mmol/L (ref 135–145)
Sodium: 138 mmol/L (ref 135–145)
Sodium: 140 mmol/L (ref 135–145)
Sodium: 141 mmol/L (ref 135–145)
Sodium: 141 mmol/L (ref 135–145)
Sodium: 142 mmol/L (ref 135–145)
TCO2: 26 mmol/L (ref 22–32)
TCO2: 27 mmol/L (ref 22–32)
TCO2: 28 mmol/L (ref 22–32)
TCO2: 29 mmol/L (ref 22–32)
TCO2: 31 mmol/L (ref 22–32)
TCO2: 32 mmol/L (ref 22–32)
pCO2 arterial: 35.6 mmHg (ref 32.0–48.0)
pCO2 arterial: 36.9 mmHg (ref 32.0–48.0)
pCO2 arterial: 37.3 mmHg (ref 32.0–48.0)
pCO2 arterial: 40.1 mmHg (ref 32.0–48.0)
pCO2 arterial: 44 mmHg (ref 32.0–48.0)
pCO2 arterial: 44.1 mmHg (ref 32.0–48.0)
pH, Arterial: 7.357 (ref 7.350–7.450)
pH, Arterial: 7.366 (ref 7.350–7.450)
pH, Arterial: 7.413 (ref 7.350–7.450)
pH, Arterial: 7.491 — ABNORMAL HIGH (ref 7.350–7.450)
pH, Arterial: 7.525 — ABNORMAL HIGH (ref 7.350–7.450)
pH, Arterial: 7.533 — ABNORMAL HIGH (ref 7.350–7.450)
pO2, Arterial: 101 mmHg (ref 83.0–108.0)
pO2, Arterial: 159 mmHg — ABNORMAL HIGH (ref 83.0–108.0)
pO2, Arterial: 185 mmHg — ABNORMAL HIGH (ref 83.0–108.0)
pO2, Arterial: 319 mmHg — ABNORMAL HIGH (ref 83.0–108.0)
pO2, Arterial: 329 mmHg — ABNORMAL HIGH (ref 83.0–108.0)
pO2, Arterial: 343 mmHg — ABNORMAL HIGH (ref 83.0–108.0)

## 2020-10-28 LAB — ECHO INTRAOPERATIVE TEE
Height: 64 in
MV Vena cont: 0.71 cm
Weight: 2334.4 oz

## 2020-10-28 LAB — GLUCOSE, CAPILLARY
Glucose-Capillary: 120 mg/dL — ABNORMAL HIGH (ref 70–99)
Glucose-Capillary: 133 mg/dL — ABNORMAL HIGH (ref 70–99)
Glucose-Capillary: 139 mg/dL — ABNORMAL HIGH (ref 70–99)
Glucose-Capillary: 143 mg/dL — ABNORMAL HIGH (ref 70–99)
Glucose-Capillary: 151 mg/dL — ABNORMAL HIGH (ref 70–99)
Glucose-Capillary: 151 mg/dL — ABNORMAL HIGH (ref 70–99)
Glucose-Capillary: 160 mg/dL — ABNORMAL HIGH (ref 70–99)
Glucose-Capillary: 174 mg/dL — ABNORMAL HIGH (ref 70–99)
Glucose-Capillary: 178 mg/dL — ABNORMAL HIGH (ref 70–99)

## 2020-10-28 LAB — POCT I-STAT, CHEM 8
BUN: 12 mg/dL (ref 8–23)
BUN: 13 mg/dL (ref 8–23)
BUN: 11 mg/dL (ref 8–23)
BUN: 11 mg/dL (ref 8–23)
BUN: 11 mg/dL (ref 8–23)
Calcium, Ion: 1.14 mmol/L — ABNORMAL LOW (ref 1.15–1.40)
Calcium, Ion: 1.19 mmol/L (ref 1.15–1.40)
Calcium, Ion: 1.03 mmol/L — ABNORMAL LOW (ref 1.15–1.40)
Calcium, Ion: 1.03 mmol/L — ABNORMAL LOW (ref 1.15–1.40)
Calcium, Ion: 1.23 mmol/L (ref 1.15–1.40)
Chloride: 96 mmol/L — ABNORMAL LOW (ref 98–111)
Chloride: 97 mmol/L — ABNORMAL LOW (ref 98–111)
Chloride: 97 mmol/L — ABNORMAL LOW (ref 98–111)
Chloride: 100 mmol/L (ref 98–111)
Chloride: 102 mmol/L (ref 98–111)
Creatinine, Ser: 0.8 mg/dL (ref 0.44–1.00)
Creatinine, Ser: 0.8 mg/dL (ref 0.44–1.00)
Creatinine, Ser: 0.7 mg/dL (ref 0.44–1.00)
Creatinine, Ser: 0.6 mg/dL (ref 0.44–1.00)
Creatinine, Ser: 0.6 mg/dL (ref 0.44–1.00)
Glucose, Bld: 114 mg/dL — ABNORMAL HIGH (ref 70–99)
Glucose, Bld: 117 mg/dL — ABNORMAL HIGH (ref 70–99)
Glucose, Bld: 120 mg/dL — ABNORMAL HIGH (ref 70–99)
Glucose, Bld: 121 mg/dL — ABNORMAL HIGH (ref 70–99)
Glucose, Bld: 141 mg/dL — ABNORMAL HIGH (ref 70–99)
HCT: 31 % — ABNORMAL LOW (ref 36.0–46.0)
HCT: 33 % — ABNORMAL LOW (ref 36.0–46.0)
HCT: 26 % — ABNORMAL LOW (ref 36.0–46.0)
HCT: 26 % — ABNORMAL LOW (ref 36.0–46.0)
HCT: 26 % — ABNORMAL LOW (ref 36.0–46.0)
Hemoglobin: 10.5 g/dL — ABNORMAL LOW (ref 12.0–15.0)
Hemoglobin: 11.2 g/dL — ABNORMAL LOW (ref 12.0–15.0)
Hemoglobin: 8.8 g/dL — ABNORMAL LOW (ref 12.0–15.0)
Hemoglobin: 8.8 g/dL — ABNORMAL LOW (ref 12.0–15.0)
Hemoglobin: 8.8 g/dL — ABNORMAL LOW (ref 12.0–15.0)
Potassium: 3 mmol/L — ABNORMAL LOW (ref 3.5–5.1)
Potassium: 3.2 mmol/L — ABNORMAL LOW (ref 3.5–5.1)
Potassium: 3.2 mmol/L — ABNORMAL LOW (ref 3.5–5.1)
Potassium: 3.4 mmol/L — ABNORMAL LOW (ref 3.5–5.1)
Potassium: 3.4 mmol/L — ABNORMAL LOW (ref 3.5–5.1)
Sodium: 136 mmol/L (ref 135–145)
Sodium: 136 mmol/L (ref 135–145)
Sodium: 137 mmol/L (ref 135–145)
Sodium: 140 mmol/L (ref 135–145)
Sodium: 139 mmol/L (ref 135–145)
TCO2: 31 mmol/L (ref 22–32)
TCO2: 33 mmol/L — ABNORMAL HIGH (ref 22–32)
TCO2: 31 mmol/L (ref 22–32)
TCO2: 28 mmol/L (ref 22–32)
TCO2: 28 mmol/L (ref 22–32)

## 2020-10-28 LAB — BASIC METABOLIC PANEL
Anion gap: 7 (ref 5–15)
BUN: 10 mg/dL (ref 8–23)
CO2: 24 mmol/L (ref 22–32)
Calcium: 8.3 mg/dL — ABNORMAL LOW (ref 8.9–10.3)
Chloride: 108 mmol/L (ref 98–111)
Creatinine, Ser: 0.76 mg/dL (ref 0.44–1.00)
GFR, Estimated: >60 mL/min (ref >60)
Glucose, Bld: 147 mg/dL — ABNORMAL HIGH (ref 70–99)
Potassium: 3.8 mmol/L (ref 3.5–5.1)
Sodium: 139 mmol/L (ref 135–145)

## 2020-10-28 LAB — URINALYSIS, ROUTINE W REFLEX MICROSCOPIC
Bilirubin Urine: NEGATIVE
Glucose, UA: NEGATIVE mg/dL
Hgb urine dipstick: NEGATIVE
Ketones, ur: NEGATIVE mg/dL
Leukocytes,Ua: NEGATIVE
Nitrite: NEGATIVE
Protein, ur: NEGATIVE mg/dL
Specific Gravity, Urine: 1.002 — ABNORMAL LOW (ref 1.005–1.030)
pH: 6 (ref 5.0–8.0)

## 2020-10-28 LAB — CBC
HCT: 31.8 % — ABNORMAL LOW (ref 36.0–46.0)
HCT: 30.5 % — ABNORMAL LOW (ref 36.0–46.0)
Hemoglobin: 10.7 g/dL — ABNORMAL LOW (ref 12.0–15.0)
Hemoglobin: 10.2 g/dL — ABNORMAL LOW (ref 12.0–15.0)
MCH: 27.6 pg (ref 26.0–34.0)
MCH: 27.6 pg (ref 26.0–34.0)
MCHC: 33.6 g/dL (ref 30.0–36.0)
MCHC: 33.4 g/dL (ref 30.0–36.0)
MCV: 82.2 fL (ref 80.0–100.0)
MCV: 82.4 fL (ref 80.0–100.0)
Platelets: 89 10*3/uL — ABNORMAL LOW (ref 150–400)
Platelets: 112 10*3/uL — ABNORMAL LOW (ref 150–400)
RBC: 3.87 MIL/uL (ref 3.87–5.11)
RBC: 3.7 MIL/uL — ABNORMAL LOW (ref 3.87–5.11)
RDW: 15.2 % (ref 11.5–15.5)
RDW: 15.1 % (ref 11.5–15.5)
WBC: 23.9 10*3/uL — ABNORMAL HIGH (ref 4.0–10.5)
WBC: 23.5 10*3/uL — ABNORMAL HIGH (ref 4.0–10.5)
nRBC: 0.1 % (ref 0.0–0.2)
nRBC: 0 % (ref 0.0–0.2)

## 2020-10-28 LAB — PROTIME-INR
INR: 1.6 — ABNORMAL HIGH (ref 0.8–1.2)
Prothrombin Time: 18.7 seconds — ABNORMAL HIGH (ref 11.4–15.2)

## 2020-10-28 LAB — HEMOGLOBIN AND HEMATOCRIT, BLOOD
HCT: 22.6 % — ABNORMAL LOW (ref 36.0–46.0)
Hemoglobin: 7.6 g/dL — ABNORMAL LOW (ref 12.0–15.0)

## 2020-10-28 LAB — APTT: aPTT: 41 seconds — ABNORMAL HIGH (ref 24–36)

## 2020-10-28 LAB — PREPARE RBC (CROSSMATCH)

## 2020-10-28 LAB — SURGICAL PCR SCREEN
MRSA, PCR: NEGATIVE
Staphylococcus aureus: NEGATIVE

## 2020-10-28 LAB — HEMOGLOBIN A1C
Hgb A1c MFr Bld: 5 % (ref 4.8–5.6)
Mean Plasma Glucose: 96.8 mg/dL

## 2020-10-28 LAB — PLATELET COUNT: Platelets: 87 10*3/uL — ABNORMAL LOW (ref 150–400)

## 2020-10-28 LAB — MAGNESIUM: Magnesium: 3 mg/dL — ABNORMAL HIGH (ref 1.7–2.4)

## 2020-10-28 SURGERY — REPAIR, MITRAL VALVE
Anesthesia: General | Site: Chest

## 2020-10-28 MED ORDER — SODIUM CHLORIDE 0.9 % IR SOLN
Status: DC | PRN
  Administered 2020-10-28: 5000 mL

## 2020-10-28 MED ORDER — SODIUM CHLORIDE 0.9% FLUSH
10.0000 mL | Freq: Two times a day (BID) | INTRAVENOUS | Status: DC
  Administered 2020-10-29 – 2020-11-01 (×6): 10 mL

## 2020-10-28 MED ORDER — CHLORHEXIDINE GLUCONATE CLOTH 2 % EX PADS
6.0000 | MEDICATED_PAD | Freq: Every day | CUTANEOUS | Status: DC
  Administered 2020-10-28 – 2020-11-06 (×8): 6 via TOPICAL

## 2020-10-28 MED ORDER — DOPAMINE-DEXTROSE 3.2-5 MG/ML-% IV SOLN
0.0000 ug/kg/min | INTRAVENOUS | Status: DC
  Administered 2020-10-28: 2.5 ug/kg/min via INTRAVENOUS
  Filled 2020-10-28: qty 250

## 2020-10-28 MED ORDER — ACETAMINOPHEN 160 MG/5ML PO SOLN: 1000.0000 mg | Freq: Four times a day (QID) | ORAL | Status: DC

## 2020-10-28 MED ORDER — SODIUM CHLORIDE 0.9 % IV SOLN: 250.0000 mL | INTRAVENOUS | Status: DC

## 2020-10-28 MED ORDER — SODIUM CHLORIDE (PF) 0.9 % IJ SOLN
INTRAMUSCULAR | Status: AC
  Filled 2020-10-28: qty 10

## 2020-10-28 MED ORDER — SODIUM CHLORIDE 0.9 % IV SOLN: INTRAVENOUS | Status: AC

## 2020-10-28 MED ORDER — SODIUM CHLORIDE 0.9% FLUSH
3.0000 mL | INTRAVENOUS | Status: DC | PRN
  Administered 2020-11-01: 3 mL via INTRAVENOUS

## 2020-10-28 MED ORDER — PHENYLEPHRINE HCL-NACL 20-0.9 MG/250ML-% IV SOLN
0.0000 ug/min | INTRAVENOUS | Status: DC
  Filled 2020-10-28: qty 250

## 2020-10-28 MED ORDER — PROPOFOL 10 MG/ML IV BOLUS
INTRAVENOUS | Status: DC | PRN
  Administered 2020-10-28: 50 mg via INTRAVENOUS

## 2020-10-28 MED ORDER — SODIUM CHLORIDE 0.9% IV SOLUTION: Freq: Once | INTRAVENOUS | Status: DC

## 2020-10-28 MED ORDER — NITROGLYCERIN IN D5W 200-5 MCG/ML-% IV SOLN: 0.0000 ug/min | INTRAVENOUS | Status: DC

## 2020-10-28 MED ORDER — LACTATED RINGERS IV SOLN: INTRAVENOUS | Status: DC | PRN

## 2020-10-28 MED ORDER — SODIUM CHLORIDE 0.9% FLUSH
3.0000 mL | Freq: Two times a day (BID) | INTRAVENOUS | Status: DC
  Administered 2020-10-29 – 2020-10-31 (×3): 3 mL via INTRAVENOUS

## 2020-10-28 MED ORDER — SODIUM CHLORIDE 0.9% FLUSH: 10.0000 mL | INTRAVENOUS | Status: DC | PRN

## 2020-10-28 MED ORDER — SODIUM CHLORIDE 0.45 % IV SOLN: INTRAVENOUS | Status: DC | PRN

## 2020-10-28 MED ORDER — MIDAZOLAM HCL 5 MG/5ML IJ SOLN
INTRAMUSCULAR | Status: DC | PRN
  Administered 2020-10-28 (×6): 1 mg via INTRAVENOUS

## 2020-10-28 MED ORDER — VANCOMYCIN HCL 1000 MG IV SOLR
INTRAVENOUS | Status: DC | PRN
  Administered 2020-10-28: 1000 mL

## 2020-10-28 MED ORDER — SODIUM CHLORIDE 0.9 % IV SOLN: INTRAVENOUS | Status: DC | PRN

## 2020-10-28 MED ORDER — MORPHINE SULFATE (PF) 2 MG/ML IV SOLN
1.0000 mg | INTRAVENOUS | Status: DC | PRN
  Filled 2020-10-28: qty 1

## 2020-10-28 MED ORDER — ALBUMIN HUMAN 5 % IV SOLN
250.0000 mL | INTRAVENOUS | Status: AC | PRN
  Administered 2020-10-28 (×4): 12.5 g via INTRAVENOUS
  Filled 2020-10-28 (×2): qty 250

## 2020-10-28 MED ORDER — ORAL CARE MOUTH RINSE
15.0000 mL | OROMUCOSAL | Status: DC
  Administered 2020-10-28 – 2020-10-29 (×5): 15 mL via OROMUCOSAL

## 2020-10-28 MED ORDER — ASPIRIN 81 MG PO CHEW: 324.0000 mg | CHEWABLE_TABLET | Freq: Every day | ORAL | Status: DC

## 2020-10-28 MED ORDER — TRAMADOL HCL 50 MG PO TABS
50.0000 mg | ORAL_TABLET | ORAL | Status: DC | PRN
  Administered 2020-10-29: 50 mg via ORAL
  Filled 2020-10-28: qty 1

## 2020-10-28 MED ORDER — HEPARIN SODIUM (PORCINE) 1000 UNIT/ML IJ SOLN
INTRAMUSCULAR | Status: DC | PRN
  Administered 2020-10-28: 20000 [IU] via INTRAVENOUS

## 2020-10-28 MED ORDER — MIDAZOLAM HCL (PF) 10 MG/2ML IJ SOLN
INTRAMUSCULAR | Status: AC
  Filled 2020-10-28: qty 2

## 2020-10-28 MED ORDER — BISACODYL 5 MG PO TBEC
10.0000 mg | DELAYED_RELEASE_TABLET | Freq: Every day | ORAL | Status: DC
  Administered 2020-10-29 – 2020-11-07 (×9): 10 mg via ORAL
  Filled 2020-10-28 (×10): qty 2

## 2020-10-28 MED ORDER — PROTAMINE SULFATE 10 MG/ML IV SOLN
INTRAVENOUS | Status: AC
  Filled 2020-10-28: qty 25

## 2020-10-28 MED ORDER — PROPOFOL 10 MG/ML IV BOLUS
INTRAVENOUS | Status: AC
  Filled 2020-10-28: qty 20

## 2020-10-28 MED ORDER — SODIUM CHLORIDE (PF) 0.9 % IJ SOLN
OROMUCOSAL | Status: DC | PRN
  Administered 2020-10-28 (×3): 4 mL via TOPICAL

## 2020-10-28 MED ORDER — INSULIN REGULAR(HUMAN) IN NACL 100-0.9 UT/100ML-% IV SOLN: INTRAVENOUS | Status: DC

## 2020-10-28 MED ORDER — ROCURONIUM BROMIDE 10 MG/ML (PF) SYRINGE
PREFILLED_SYRINGE | INTRAVENOUS | Status: DC | PRN
  Administered 2020-10-28: 100 mg via INTRAVENOUS
  Administered 2020-10-28 (×2): 50 mg via INTRAVENOUS

## 2020-10-28 MED ORDER — PHENYLEPHRINE 40 MCG/ML (10ML) SYRINGE FOR IV PUSH (FOR BLOOD PRESSURE SUPPORT): PREFILLED_SYRINGE | INTRAVENOUS | Status: DC | PRN

## 2020-10-28 MED ORDER — ACETAMINOPHEN 650 MG RE SUPP
650.0000 mg | Freq: Once | RECTAL | Status: AC
  Administered 2020-10-28: 650 mg via RECTAL

## 2020-10-28 MED ORDER — HEPARIN SODIUM (PORCINE) 1000 UNIT/ML IJ SOLN
INTRAMUSCULAR | Status: AC
  Filled 2020-10-28: qty 1

## 2020-10-28 MED ORDER — PROTAMINE SULFATE 10 MG/ML IV SOLN
INTRAVENOUS | Status: DC | PRN
  Administered 2020-10-28: 200 mg via INTRAVENOUS

## 2020-10-28 MED ORDER — FENTANYL CITRATE (PF) 250 MCG/5ML IJ SOLN
INTRAMUSCULAR | Status: DC | PRN
  Administered 2020-10-28: 50 ug via INTRAVENOUS
  Administered 2020-10-28: 100 ug via INTRAVENOUS
  Administered 2020-10-28 (×2): 25 ug via INTRAVENOUS
  Administered 2020-10-28: 200 ug via INTRAVENOUS
  Administered 2020-10-28: 100 ug via INTRAVENOUS
  Administered 2020-10-28: 150 ug via INTRAVENOUS
  Administered 2020-10-28 (×2): 100 ug via INTRAVENOUS
  Administered 2020-10-28: 150 ug via INTRAVENOUS

## 2020-10-28 MED ORDER — DEXTROSE 50 % IV SOLN: 0.0000 mL | INTRAVENOUS | Status: DC | PRN

## 2020-10-28 MED ORDER — CHLORHEXIDINE GLUCONATE 4 % EX LIQD
CUTANEOUS | Status: AC
  Filled 2020-10-28: qty 15

## 2020-10-28 MED ORDER — LACTATED RINGERS IV SOLN: INTRAVENOUS | Status: DC

## 2020-10-28 MED ORDER — ROCURONIUM BROMIDE 10 MG/ML (PF) SYRINGE
PREFILLED_SYRINGE | INTRAVENOUS | Status: AC
  Filled 2020-10-28: qty 20

## 2020-10-28 MED ORDER — MIDAZOLAM HCL 2 MG/2ML IJ SOLN: 2.0000 mg | INTRAMUSCULAR | Status: DC | PRN

## 2020-10-28 MED ORDER — DEXMEDETOMIDINE HCL IN NACL 400 MCG/100ML IV SOLN
0.0000 ug/kg/h | INTRAVENOUS | Status: DC
  Administered 2020-10-28: 0.2 ug/kg/h via INTRAVENOUS
  Filled 2020-10-28: qty 100

## 2020-10-28 MED ORDER — ALBUMIN HUMAN 5 % IV SOLN: INTRAVENOUS | Status: DC | PRN

## 2020-10-28 MED ORDER — SODIUM CHLORIDE 0.9 % IV SOLN: INTRAVENOUS | Status: DC

## 2020-10-28 MED ORDER — MAGNESIUM SULFATE 4 GM/100ML IV SOLN
4.0000 g | Freq: Once | INTRAVENOUS | Status: AC
  Administered 2020-10-28: 4 g via INTRAVENOUS
  Filled 2020-10-28: qty 100

## 2020-10-28 MED ORDER — VANCOMYCIN HCL IN DEXTROSE 1-5 GM/200ML-% IV SOLN
1000.0000 mg | Freq: Once | INTRAVENOUS | Status: AC
  Administered 2020-10-28: 1000 mg via INTRAVENOUS
  Filled 2020-10-28: qty 200

## 2020-10-28 MED ORDER — CHLORHEXIDINE GLUCONATE 0.12% ORAL RINSE (MEDLINE KIT)
15.0000 mL | Freq: Two times a day (BID) | OROMUCOSAL | Status: DC
  Administered 2020-10-28: 15 mL via OROMUCOSAL

## 2020-10-28 MED ORDER — BISACODYL 10 MG RE SUPP: 10.0000 mg | Freq: Every day | RECTAL | Status: DC

## 2020-10-28 MED ORDER — FAMOTIDINE IN NACL 20-0.9 MG/50ML-% IV SOLN
20.0000 mg | Freq: Two times a day (BID) | INTRAVENOUS | Status: AC
  Administered 2020-10-28 (×2): 20 mg via INTRAVENOUS
  Filled 2020-10-28 (×3): qty 50

## 2020-10-28 MED ORDER — LACTATED RINGERS IV SOLN: 500.0000 mL | Freq: Once | INTRAVENOUS | Status: DC | PRN

## 2020-10-28 MED ORDER — ONDANSETRON HCL 4 MG/2ML IJ SOLN
4.0000 mg | Freq: Four times a day (QID) | INTRAMUSCULAR | Status: DC | PRN
  Administered 2020-10-29: 4 mg via INTRAVENOUS
  Filled 2020-10-28: qty 2

## 2020-10-28 MED ORDER — ACETAMINOPHEN 500 MG PO TABS
1000.0000 mg | ORAL_TABLET | Freq: Four times a day (QID) | ORAL | Status: AC
  Administered 2020-10-28 – 2020-11-02 (×16): 1000 mg via ORAL
  Filled 2020-10-28 (×16): qty 2

## 2020-10-28 MED ORDER — DOCUSATE SODIUM 100 MG PO CAPS
200.0000 mg | ORAL_CAPSULE | Freq: Every day | ORAL | Status: DC
  Administered 2020-10-29 – 2020-10-31 (×3): 200 mg via ORAL
  Filled 2020-10-28 (×3): qty 2

## 2020-10-28 MED ORDER — PHENYLEPHRINE 40 MCG/ML (10ML) SYRINGE FOR IV PUSH (FOR BLOOD PRESSURE SUPPORT)
PREFILLED_SYRINGE | INTRAVENOUS | Status: AC
  Filled 2020-10-28: qty 10

## 2020-10-28 MED ORDER — CHLORHEXIDINE GLUCONATE 0.12 % MT SOLN
15.0000 mL | OROMUCOSAL | Status: AC
  Administered 2020-10-28: 15 mL via OROMUCOSAL

## 2020-10-28 MED ORDER — ACETAMINOPHEN 160 MG/5ML PO SOLN: 650.0000 mg | Freq: Once | ORAL | Status: AC

## 2020-10-28 MED ORDER — FENTANYL CITRATE (PF) 250 MCG/5ML IJ SOLN
INTRAMUSCULAR | Status: AC
  Filled 2020-10-28: qty 25

## 2020-10-28 MED ORDER — CEFAZOLIN SODIUM-DEXTROSE 2-4 GM/100ML-% IV SOLN
2.0000 g | Freq: Three times a day (TID) | INTRAVENOUS | Status: AC
  Administered 2020-10-28 – 2020-10-30 (×6): 2 g via INTRAVENOUS
  Filled 2020-10-28 (×7): qty 100

## 2020-10-28 MED ORDER — PANTOPRAZOLE SODIUM 40 MG PO TBEC
40.0000 mg | DELAYED_RELEASE_TABLET | Freq: Every day | ORAL | Status: DC
  Administered 2020-10-30 – 2020-11-07 (×9): 40 mg via ORAL
  Filled 2020-10-28 (×9): qty 1

## 2020-10-28 MED ORDER — OXYCODONE HCL 5 MG PO TABS: 5.0000 mg | ORAL_TABLET | ORAL | Status: DC | PRN

## 2020-10-28 MED ORDER — POTASSIUM CHLORIDE 10 MEQ/50ML IV SOLN
10.0000 meq | INTRAVENOUS | Status: AC
  Administered 2020-10-28 (×3): 10 meq via INTRAVENOUS

## 2020-10-28 MED ORDER — ASPIRIN EC 325 MG PO TBEC: 325.0000 mg | DELAYED_RELEASE_TABLET | Freq: Every day | ORAL | Status: DC

## 2020-10-28 MED ORDER — PLASMA-LYTE 148 IV SOLN
INTRAVENOUS | Status: DC | PRN
  Administered 2020-10-28: 500 mL via INTRAVASCULAR

## 2020-10-28 MED FILL — Heparin Sod (Porcine)-NaCl IV Soln 1000 Unit/500ML-0.9%: INTRAVENOUS | Qty: 500 | Status: AC

## 2020-10-28 SURGICAL SUPPLY — 168 items
ADAPTER CARDIO PERF ANTE/RETRO (ADAPTER) ×8 IMPLANT
ADH SKN CLS APL DERMABOND .7 (GAUZE/BANDAGES/DRESSINGS) ×2
ADPR PRFSN 84XANTGRD RTRGD (ADAPTER) ×4
APL SKNCLS STERI-STRIP NONHPOA (GAUZE/BANDAGES/DRESSINGS) ×2
APL SWBSTK 6 STRL LF DISP (MISCELLANEOUS)
APPLICATOR COTTON TIP 6 STRL (MISCELLANEOUS) IMPLANT
APPLICATOR COTTON TIP 6IN STRL (MISCELLANEOUS)
ARTICLIP LAA PROCLIP II 45 (Clip) ×3 IMPLANT
ARTICLIP LAA PROCLIP II 45MM (Clip) ×1 IMPLANT
ATTRACTOMAT 16X20 MAGNETIC DRP (DRAPES) ×4 IMPLANT
BAG DECANTER FOR FLEXI CONT (MISCELLANEOUS) ×8 IMPLANT
BENZOIN TINCTURE PRP APPL 2/3 (GAUZE/BANDAGES/DRESSINGS) ×4 IMPLANT
BLADE CLIPPER SURG (BLADE) ×4 IMPLANT
BLADE STERNUM SYSTEM 6 (BLADE) ×8 IMPLANT
BLADE SURG 11 STRL SS (BLADE) ×8 IMPLANT
CABLE PACING FASLOC BIEGE (MISCELLANEOUS) ×2 IMPLANT
CABLE PACING FASLOC BLUE (MISCELLANEOUS) ×2 IMPLANT
CANISTER SUCT 3000ML PPV (MISCELLANEOUS) ×12 IMPLANT
CANN PRFSN 3/8X14X24FR PCFC (MISCELLANEOUS)
CANN PRFSN 3/8XCNCT ST RT ANG (MISCELLANEOUS)
CANNULA AORTIC ROOT 9FR (CANNULA) ×2 IMPLANT
CANNULA EZ GLIDE AORTIC 21FR (CANNULA) ×2 IMPLANT
CANNULA FEM VENOUS REMOTE 22FR (CANNULA) ×2 IMPLANT
CANNULA GUNDRY RCSP 15FR (MISCELLANEOUS) IMPLANT
CANNULA OPTISITE PERFUSION 18F (CANNULA) IMPLANT
CANNULA PRFSN 3/8X14X24FR PCFC (MISCELLANEOUS) IMPLANT
CANNULA PRFSN 3/8XCNCT RT ANG (MISCELLANEOUS) IMPLANT
CANNULA SUMP PERICARDIAL (CANNULA) ×6 IMPLANT
CANNULA VEN MTL TIP RT (MISCELLANEOUS)
CANNULA VENNOUS METAL TIP 20FR (CANNULA) ×2 IMPLANT
CARDIOBLATE CARDIAC ABLATION (MISCELLANEOUS)
CATH ROBINSON RED A/P 18FR (CATHETERS) ×4 IMPLANT
CATH THORACIC 28FR RT ANG (CATHETERS) IMPLANT
CATH THORACIC 36FR (CATHETERS) IMPLANT
CK QUICK LOAD INDIVIDUAL WRAP (Prosthesis & Implant Heart) ×5 IMPLANT
CLIP FOGARTY SPRING 6M (CLIP) IMPLANT
CLOSURE PERCLOSE PROSTYLE (VASCULAR PRODUCTS) ×6 IMPLANT
CONN 1/2X1/2X1/2  BEN (MISCELLANEOUS) ×12
CONN 1/2X1/2X1/2 BEN (MISCELLANEOUS) ×4 IMPLANT
CONN 3/8X1/2 ST GISH (MISCELLANEOUS) ×18 IMPLANT
CONN ST 1/4X3/8  BEN (MISCELLANEOUS) ×8
CONN ST 1/4X3/8 BEN (MISCELLANEOUS) IMPLANT
CONTAINER PROTECT SURGISLUSH (MISCELLANEOUS) ×8 IMPLANT
COVER MAYO STAND STRL (DRAPES) ×4 IMPLANT
COVER PROBE W GEL 5X96 (DRAPES) ×2 IMPLANT
COVER SURGICAL LIGHT HANDLE (MISCELLANEOUS) ×8 IMPLANT
DERMABOND ADVANCED (GAUZE/BANDAGES/DRESSINGS) ×2
DERMABOND ADVANCED .7 DNX12 (GAUZE/BANDAGES/DRESSINGS) ×4 IMPLANT
DEVICE ATRICLIP LAA PRCLPII 45 (Clip) IMPLANT
DEVICE CARDIOBLATE CARDIAC ABL (MISCELLANEOUS) IMPLANT
DEVICE CLOSURE PERCLS PRGLD 6F (VASCULAR PRODUCTS) IMPLANT
DEVICE SUT CK QUICK LOAD INDV (Prosthesis & Implant Heart) ×5 IMPLANT
DEVICE SUT CK QUICK LOAD MINI (Prosthesis & Implant Heart) ×6 IMPLANT
DEVICE TROCAR PUNCTURE CLOSURE (ENDOMECHANICALS) ×4 IMPLANT
DRAIN CHANNEL 32F RND 10.7 FF (WOUND CARE) ×8 IMPLANT
DRAPE CARDIOVASCULAR INCISE (DRAPES) ×4
DRAPE CV SPLIT W-CLR ANES SCRN (DRAPES) ×4 IMPLANT
DRAPE INCISE IOBAN 66X45 STRL (DRAPES) ×12 IMPLANT
DRAPE PERI GROIN 82X75IN TIB (DRAPES) ×4 IMPLANT
DRAPE SRG 135X102X78XABS (DRAPES) ×2 IMPLANT
DRAPE WARM FLUID 44X44 (DRAPES) ×4 IMPLANT
DRSG AQUACEL AG ADV 3.5X10 (GAUZE/BANDAGES/DRESSINGS) ×2 IMPLANT
DRSG AQUACEL AG ADV 3.5X14 (GAUZE/BANDAGES/DRESSINGS) ×6 IMPLANT
DRSG COVADERM 4X8 (GAUZE/BANDAGES/DRESSINGS) ×4 IMPLANT
ELECT BLADE 6.5 EXT (BLADE) ×4 IMPLANT
ELECT REM PT RETURN 9FT ADLT (ELECTROSURGICAL) ×16
ELECTRODE REM PT RTRN 9FT ADLT (ELECTROSURGICAL) ×8 IMPLANT
FELT TEFLON 1X6 (MISCELLANEOUS) ×16 IMPLANT
FIBERTAPE STERNAL CLSR 2 36IN (SUTURE) ×8 IMPLANT
FIBERTAPE STERNAL CLSR 2X36 (SUTURE) ×8 IMPLANT
GAUZE SPONGE 4X4 12PLY STRL (GAUZE/BANDAGES/DRESSINGS) ×10 IMPLANT
GLOVE BIO SURGEON STRL SZ 6 (GLOVE) IMPLANT
GLOVE BIO SURGEON STRL SZ 6.5 (GLOVE) IMPLANT
GLOVE BIO SURGEON STRL SZ7 (GLOVE) IMPLANT
GLOVE BIO SURGEON STRL SZ7.5 (GLOVE) IMPLANT
GLOVE BIO SURGEONS STRL SZ 6.5 (GLOVE)
GLOVE ORTHO TXT STRL SZ7.5 (GLOVE) ×12 IMPLANT
GLOVE SURG MICRO LTX SZ6.5 (GLOVE) ×12 IMPLANT
GLOVE SURG UNDER POLY LF SZ6.5 (GLOVE) ×8 IMPLANT
GLOVE SURG UNDER POLY LF SZ7 (GLOVE) ×4 IMPLANT
GOWN STRL REUS W/ TWL LRG LVL3 (GOWN DISPOSABLE) ×16 IMPLANT
GOWN STRL REUS W/TWL LRG LVL3 (GOWN DISPOSABLE) ×48
HEMOSTAT POWDER SURGIFOAM 1G (HEMOSTASIS) ×16 IMPLANT
INSERT FOGARTY XLG (MISCELLANEOUS) ×4 IMPLANT
KIT BASIN OR (CUSTOM PROCEDURE TRAY) ×8 IMPLANT
KIT COMBO MINI 4X17COR-KNOT (Prosthesis & Implant Heart) ×1 IMPLANT
KIT DILATOR VASC 18G NDL (KITS) ×6 IMPLANT
KIT DRAINAGE VACCUM ASSIST (KITS) ×2 IMPLANT
KIT SUCTION CATH 14FR (SUCTIONS) ×18 IMPLANT
KIT SUT CK MINI COMBO 4X17 (Prosthesis & Implant Heart) ×1 IMPLANT
KIT TURNOVER KIT B (KITS) ×8 IMPLANT
LINE VENT (MISCELLANEOUS) ×2 IMPLANT
LOAD QUICK .035MM CK MINI (Prosthesis & Implant Heart) ×6 IMPLANT
LOOP VESSEL SUPERMAXI WHITE (MISCELLANEOUS) ×4 IMPLANT
MARKER GRAFT CORONARY BYPASS (MISCELLANEOUS) IMPLANT
NDL SUT 1 .5 CRC FRENCH EYE (NEEDLE) IMPLANT
NDL SUT PASSING CERCLAG MED (SUTURE) IMPLANT
NDL SUT PASSING CERCLAGE MED (SUTURE) ×8
NEEDLE FRENCH EYE (NEEDLE) ×4
NEEDLE SUT PASSING CERCLAG MED (SUTURE) ×4 IMPLANT
NS IRRIG 1000ML POUR BTL (IV SOLUTION) ×36 IMPLANT
PACK E OPEN HEART (SUTURE) ×4 IMPLANT
PACK OPEN HEART (CUSTOM PROCEDURE TRAY) ×8 IMPLANT
PAD ARMBOARD 7.5X6 YLW CONV (MISCELLANEOUS) ×16 IMPLANT
PAD ELECT DEFIB RADIOL ZOLL (MISCELLANEOUS) ×4 IMPLANT
PENCIL BUTTON HOLSTER BLD 10FT (ELECTRODE) ×2 IMPLANT
PERCLOSE PROGLIDE 6F (VASCULAR PRODUCTS)
POSITIONER HEAD DONUT 9IN (MISCELLANEOUS) ×8 IMPLANT
RING ANLPLS SIMUFORM 26 (Prosthesis & Implant Heart) IMPLANT
RING ANLPLS SIMUFORM 28 (Prosthesis & Implant Heart) IMPLANT
RING ANNULOPLASTY SIMUFORM 26 (Prosthesis & Implant Heart) ×4 IMPLANT
RING ANNULOPLASTY SIMUFORM 28 (Prosthesis & Implant Heart) ×4 IMPLANT
SET CARDIOPLEGIA MPS 5001102 (MISCELLANEOUS) ×2 IMPLANT
SET IRRIG TUBING LAPAROSCOPIC (IRRIGATION / IRRIGATOR) ×6 IMPLANT
SHEATH PINNACLE 8F 10CM (SHEATH) ×2 IMPLANT
SOL ANTI FOG 6CC (MISCELLANEOUS) ×2 IMPLANT
SOLUTION ANTI FOG 6CC (MISCELLANEOUS) ×2
SUT BONE WAX W31G (SUTURE) ×4 IMPLANT
SUT EB EXC GRN/WHT 2-0 D/A SH (SUTURE) ×4
SUT EB EXC GRN/WHT 2-0 V-5 (SUTURE) ×2 IMPLANT
SUT ETHIBOND (SUTURE) ×4 IMPLANT
SUT ETHIBOND 2 0 SH (SUTURE) ×12 IMPLANT
SUT ETHIBOND 2 0 SH 36X2 (SUTURE) ×4 IMPLANT
SUT ETHIBOND 2 0 V4 (SUTURE) IMPLANT
SUT ETHIBOND 2 0V4 GREEN (SUTURE) IMPLANT
SUT ETHIBOND 2-0 RB-1 WHT (SUTURE) ×4 IMPLANT
SUT ETHIBOND 4 0 TF (SUTURE) IMPLANT
SUT ETHIBOND 5 0 C 1 30 (SUTURE) ×4 IMPLANT
SUT ETHIBOND X763 2 0 SH 1 (SUTURE) IMPLANT
SUT MNCRL AB 3-0 PS2 18 (SUTURE) IMPLANT
SUT MNCRL AB 4-0 PS2 18 (SUTURE) ×4 IMPLANT
SUT PDS AB 1 CTX 36 (SUTURE) IMPLANT
SUT PROLENE 3 0 SH 1 (SUTURE) ×8 IMPLANT
SUT PROLENE 3 0 SH DA (SUTURE) ×6 IMPLANT
SUT PROLENE 3 0 SH1 36 (SUTURE) ×12 IMPLANT
SUT PROLENE 4 0 RB 1 (SUTURE) ×32
SUT PROLENE 4 0 SH DA (SUTURE) ×16 IMPLANT
SUT PROLENE 4-0 RB1 .5 CRCL 36 (SUTURE) ×8 IMPLANT
SUT SILK  1 MH (SUTURE) ×20
SUT SILK 1 MH (SUTURE) ×4 IMPLANT
SUT SILK 2 0SH CR/8 30 (SUTURE) ×4 IMPLANT
SUT SILK 3 0SH CR/8 30 (SUTURE) ×4 IMPLANT
SUT STEEL 6MS V (SUTURE) IMPLANT
SUT STEEL STERNAL CCS#1 18IN (SUTURE) IMPLANT
SUT STEEL SZ 6 DBL 3X14 BALL (SUTURE) IMPLANT
SUT VIC AB 2-0 CTX 27 (SUTURE) ×8 IMPLANT
SUT VIC AB 2-0 UR6 27 (SUTURE) ×8 IMPLANT
SUT VIC AB 3-0 SH 8-18 (SUTURE) ×8 IMPLANT
SUT VICRYL 2 TP 1 (SUTURE) ×4 IMPLANT
SUTURE EB EXC GRN/WHT 2-0 D/A (SUTURE) IMPLANT
SYR 10ML LL (SYRINGE) ×4 IMPLANT
SYSTEM SAHARA CHEST DRAIN ATS (WOUND CARE) ×14 IMPLANT
TAPE CLOTH SURG 4X10 WHT LF (GAUZE/BANDAGES/DRESSINGS) ×2 IMPLANT
TAPE PAPER 2X10 WHT MICROPORE (GAUZE/BANDAGES/DRESSINGS) ×2 IMPLANT
TAPE UMBILICAL COTTON 1/8X30 (MISCELLANEOUS) ×4 IMPLANT
TOWEL GREEN STERILE (TOWEL DISPOSABLE) ×8 IMPLANT
TOWEL GREEN STERILE FF (TOWEL DISPOSABLE) ×8 IMPLANT
TRAY FOLEY SLVR 14FR TEMP STAT (SET/KITS/TRAYS/PACK) ×4 IMPLANT
TRAY FOLEY SLVR 16FR TEMP STAT (SET/KITS/TRAYS/PACK) ×4 IMPLANT
TROCAR XCEL BLADELESS 5X75MML (TROCAR) ×8 IMPLANT
TUBE SUCT INTRACARD DLP 20F (MISCELLANEOUS) ×2 IMPLANT
TUBING LAP HI FLOW INSUFFLATIO (TUBING) ×4 IMPLANT
TUNNELER SHEATH ON-Q 11GX8 DSP (PAIN MANAGEMENT) IMPLANT
UNDERPAD 30X36 HEAVY ABSORB (UNDERPADS AND DIAPERS) ×8 IMPLANT
VALVE MITRAL SZ 29 (Prosthesis & Implant Heart) ×2 IMPLANT
WATER STERILE IRR 1000ML POUR (IV SOLUTION) ×16 IMPLANT
WIRE EMERALD 3MM-J .035X150CM (WIRE) IMPLANT
WIRE HI TORQ VERSACORE-J 145CM (WIRE) ×2 IMPLANT

## 2020-10-28 NOTE — Anesthesia Procedure Notes (Signed)
Arterial Line Insertion Start/End5/02/2021 7:00 AM, 10/28/2020 7:10 AM Performed by: Reece Agar, CRNA  Patient location: Pre-op. Preanesthetic checklist: patient identified, IV checked, site marked, risks and benefits discussed, surgical consent, monitors and equipment checked, pre-op evaluation, timeout performed and anesthesia consent Lidocaine 1% used for infiltration and patient sedated Left, radial was placed Catheter size: 20 G Hand hygiene performed , maximum sterile barriers used  and Seldinger technique used Allen's test indicative of satisfactory collateral circulation Attempts: 1 Procedure performed without using ultrasound guided technique. Following insertion, dressing applied and Biopatch. Post procedure assessment: normal and unchanged

## 2020-10-28 NOTE — Anesthesia Procedure Notes (Signed)
Central Venous Catheter Insertion Performed by: Catalina Gravel, MD, anesthesiologist Start/End5/02/2021 6:55 AM, 10/28/2020 7:05 AM Patient location: Pre-op. Preanesthetic checklist: patient identified, IV checked, site marked, risks and benefits discussed, surgical consent, monitors and equipment checked, pre-op evaluation, timeout performed and anesthesia consent Position: Trendelenburg Lidocaine 1% used for infiltration and patient sedated Hand hygiene performed , maximum sterile barriers used  and Seldinger technique used Catheter size: 9 Fr Central line was placed.MAC introducer Procedure performed using ultrasound guided technique. Ultrasound Notes:anatomy identified, needle tip was noted to be adjacent to the nerve/plexus identified, no ultrasound evidence of intravascular and/or intraneural injection and image(s) printed for medical record Attempts: 1 Following insertion, line sutured, dressing applied and Biopatch. Post procedure assessment: free fluid flow, blood return through all ports and no air  Patient tolerated the procedure well with no immediate complications.

## 2020-10-28 NOTE — Op Note (Signed)
CARDIOTHORACIC SURGERY OPERATIVE NOTE  Date of Procedure:  10/28/2020  Preoperative Diagnosis:   Severe Mitral Regurgitation  Permanent Atrial Fibrillation  Postoperative Diagnosis: Same  Procedure:   Mitral Valve Replacement   Medtronic Mosaic Stented Porcine Bioprosthetic Tissue Valve (size 88mm, model #310, serial #[id number])  Clipping of left atrial appendage (Atricure Pro245 left atrial clip, size 34mm)    Surgeon: Valentina Gu. Roxy Manns, MD  Assistant: Ellwood Handler, PA-C  Anesthesia: Hoy Morn, MD  Operative Findings:  Mixed valvular pathology with pure annular dilatation (type I) and some post-inflammatory degenerative fibrous change causing restricted leaflet mobility (type IIIA) casing severe mitral regurgitation  Normal left ventricular systolic function  Normal right ventricular size and function  Severe pulmonary hypertension  Mild (1+) tricuspid regurgitation            BRIEF CLINICAL NOTE AND INDICATIONS FOR SURGERY  Patient is a 75 year old African-American female with history of mitral regurgitation, longstanding persistent atrial fibrillation on long-term anticoagulation using Eliquis, hypertension, chronic diastolic congestive heart failure, pulmonary hypertension, rheumatoid arthritis on long-term methotrexate and chronic anemia who has been referred for surgical consultation to discuss treatment options for management of severe mitral regurgitation.  Patient states that she has been told that she had a heart murmur since she was very young.  She does not recall ever being told that she had rheumatic fever or rheumatic heart disease.  She has a long history of exertional shortness of breath and originally presented with acute exacerbation of chronic diastolic congestive heart failure in the setting of severe hypertension with new onset atrial fibrillation in 2014.  She has been followed intermittently ever since by Dr. Gwenlyn Found.    The patient states  that symptoms of shortness of breath have progressed considerably over the past year or 2.  Follow-up echocardiogram performed February 19, 2020 revealed normal left ventricular size and systolic function with severe left atrial enlargement and severe mitral regurgitation.  She was seen in follow-up several weeks ago at which time she complained that she got short of breath walking very short distances.  She also had significant lower extremity edema.  Her dose of Lasix was increased at that time and she was scheduled for elective transesophageal echocardiogram and diagnostic cardiac catheterization.  TEE confirmed the presence of severe mitral regurgitation with severe left atrial enlargement.  Left ventricular ejection fraction was reported 55 to 60%.  There was evidence of moderately reduced right ventricular function and moderate tricuspid regurgitation.  Left and right heart catheterization revealed normal coronary anatomy with no significant coronary artery disease.  There was severe pulmonary hypertension (PA pressures 80/32 mmHg) with preserved cardiac output.  Mean right atrial pressure was 16 mmHg.  Cardiothoracic surgical consultation was requested.  Patient was also seen in consultation by the advanced heart failure team and aggressive preoperative medical therapy was initiated including intravenous pressors using milrinone.  Follow-up repeat right heart catheterization revealed some improvement although persistent severe pulmonary hypertension despite normal well compensated forward cardiac output and low central venous pressure.  The patient has been seen in consultation and counseled at length regarding the indications, risks and potential benefits of surgery.  All questions have been answered, and the patient provides full informed consent for the operation as described.    DETAILS OF THE OPERATIVE PROCEDURE  Preparation:  The patient is brought to the operating room on the above mentioned  date and central monitoring was established by the anesthesia team including placement of Swan-Ganz catheter and radial arterial line.  The patient is placed in the supine position on the operating table.  Intravenous antibiotics are administered. General endotracheal anesthesia is induced uneventfully. A Foley catheter is placed.  Baseline transesophageal echocardiogram was performed.  Findings were notable for severe mitral regurgitation.  Functional pathology appeared consistent with a combination of pure annular dilatation with some tendency for restricted mobility of the posterior leaflet.  There was severe left atrial enlargement.  There was normal right ventricular size and function.  There was trace to mild tricuspid regurgitation.  Tricuspid annulus measured 3.9 cm.  The patient's chest, abdomen, both groins, and both lower extremities are prepared and draped in a sterile manner. A time out procedure is performed.   Percutaneous Vascular Access:  Percutaneous venous access were obtained on the right side.  Using ultrasound guidance the right common femoral vein was cannulated using the Seldinger technique and a pair of Perclose vascular closure devises were placed at opposing 30 degree angles, after which time an 8 French sheath inserted.     Surgical Approach:  A median sternotomy incision was performed and the pericardium is opened. The ascending aorta is normal in appearance.    Extracorporeal Cardiopulmonary Bypass and Myocardial Protection:  The right common femoral vein is cannulated using the Seldinger technique and a guidewire advanced into the right atrium using TEE guidance.  The patient is heparinized systemically and the femoral vein cannulated using a 22 Fr long femoral venous cannula.  The ascending aorta is cannulated for cardiopulmonary bypass.  Adequate heparinization is verified.   A retrograde cardioplegia cannula is placed through the right atrium into the coronary  sinus.   The entire pre-bypass portion of the operation was notable for stable hemodynamics.  Cardiopulmonary bypass was begun and the surface of the heart is inspected.  A second venous cannula is placed directly into the superior vena cava.   A cardioplegia cannula is placed in the ascending aorta.  A temperature probe was placed in the interventricular septum.  The patient is cooled to 32C systemic temperature.  The aortic cross clamp is applied and cold blood cardioplegia is delivered initially in an antegrade fashion through the aortic root.  Supplemental cardioplegia is given retrograde through the coronary sinus catheter.  Iced saline slush is applied for topical hypothermia.  The initial cardioplegic arrest is rapid with early diastolic arrest.  Repeat doses of cardioplegia are administered intermittently throughout the entire cross clamp portion of the operation through the aortic root and through the coronary sinus catheter in order to maintain completely flat electrocardiogram and septal myocardial temperature below 15C.  Myocardial protection was felt to be excellent.   Clipping of Left Atrial Appendage:  The heart is retracted towards the surgeon's side the left atrial appendage was obliterated using an left atrial appendage clip (Atricure Pro245 left atrial clip size 45 mm).  The heart was replaced into the pericardial sac.   Mitral Valve Replacement:  The mitral valve was inspected and notable for mild to moderate sclerosis of the free margin of the anterior leaflet with mild to moderate sclerosis of multiple scallops of the posterior leaflet.  The valve did not have rheumatic appearance and that the subvalvular apparatus had only mild fibrous disease and there was no evidence for chordal fusion or chordal shortening.  There was no mitral stenosis whatsoever.  Both leaflets seemed potentially pliable enough to facilitate repair using ring annuloplasty.  Interrupted 2-0 Ethibond  horizontal mattress sutures were placed circumferentially around the mitral annulus.  With the  valve suspended valve function was again reassessed with saline testing.  There appeared to be adequate mobility to facilitate ring annuloplasty.  The valve was sized to support a 28 mm annuloplasty ring based upon the overall surface area of the anterior leaflet and the intra trigonal distance.  A 28 mm Medtronic Simuform annuloplasty ring was lowered into place and the valve was retested.  At this juncture there appeared to be inadequate surface area of coaptation.  The 28 mm ring was removed and downsized to a 26 mm ring.  The 26 mm ring was again lowered into place and there appeared to be adequate surface area of coaptation.  All ring sutures were secured using Cor knot clips.  With saline testing there remained residual leak posteriorly due to restricted posterior leaflet mobility.  Decision was made to proceed with valve replacement and the annuloplasty ring was removed.  The anterior leaflet of the mitral valve was excised sharply, leaving a small rim of the free margin and the associated primary chords.  The posterior leaflet split in the midline.  The mitral annulus was sized to accept a 29 mm prosthesis.  The left ventricle was irrigated with copious cold saline solution.  Mitral valve replacement was performed using interrupted horizontal mattress 2-0 Ethibond pledgeted sutures with pledgets in the supraannular position.  The remaining portions of the anterior leaflet were incorporated into the suture line laterally, thereby preserving chords to both the anterior and posterior leaflet.  A Medtronic Mosaic stented porcine bioprosthetic tissue valve (size 29 mm, model # 310, serial # [id number]) was implanted uneventfully. The valve seated appropriately with care to position the commissure posts away from the left ventricular outflow tract.  Rewarming is begun.  The atriotomy was closed using a 2-layer closure  of running 3-0 Prolene suture after placing a sump drain across the mitral valve to serve as a left ventricular vent.  One final dose of warm retrograde "reanimation dose" cardioplegia was administered retrograde through the coronary sinus catheter while all air was evacuated through the aortic root.  The aortic cross clamp was removed after a total cross clamp time of 138 minutes.   Procedure Completion:  Epicardial pacing wires are fixed to the right ventricular outflow tract. The patient is rewarmed to 37C temperature. The aortic and left ventricular vents are removed.  The patient is weaned and disconnected from cardiopulmonary bypass.  The patient's rhythm at separation from bypass was VVI paced.  The patient was weaned from cardiopulmonary bypass on low dose milrinone infusion. Total cardiopulmonary bypass time for the operation was 161 minutes.  Followup transesophageal echocardiogram performed after separation from bypass revealed a well-seated mitral valve prosthesis that was functioning normally and without any sign of paravalvular leak.  Left ventricular function was unchanged from preoperatively.  The aortic and superior vena cava cannula were removed uneventfully. Protamine was administered to reverse the anticoagulation. The femoral venous cannula was removed and Perclose sutures secured, and manual pressure held on the groin for 20 minutes.  The mediastinum and pleural space were inspected for hemostasis and irrigated with saline solution. The mediastinum and both pleural spaces were drained using 4 chest tubes placed through separate stab incisions inferiorly.  The soft tissues anterior to the aorta were reapproximated loosely. The sternum is closed with Fibertape cerclage. The soft tissues anterior to the sternum were closed in multiple layers and the skin is closed with a running subcuticular skin closure.  The post-bypass portion of the operation was notable for  stable rhythm and  hemodynamics.  The patient received 1 unit packed red blood cells during the procedure due to anemia which was present preoperatively and exacerbated by acute blood loss and hemodilution during cardiopulmonary bypass.    Patient Disposition:  The patient tolerated the procedure well and is transported to the surgical intensive care in stable condition. There are no intraoperative complications. All sponge instrument and needle counts are verified correct at completion of the operation.     Valentina Gu. Roxy Manns MD 10/28/2020 1:20 PM

## 2020-10-28 NOTE — Brief Op Note (Signed)
10/21/2020 - 10/28/2020  12:01 PM  PATIENT:  Joanna Hood  75 y.o. female  PRE-OPERATIVE DIAGNOSIS:  MR AFIB  POST-OPERATIVE DIAGNOSIS:  MR AFIB  PROCEDURE:  Procedure(s):  MITRAL VALVE REPLACEMENT   -MEDTRONIC MOSAIC VALVE SIZE 29MM (N/A)  CLIPPING OF ATRIAL APPENDAGE USING ATRICURE  CLIP SIZE 45MM   TRANSESOPHAGEAL ECHOCARDIOGRAM (TEE) (N/A)  SURGEON:  Surgeon(s) and Role:    Rexene Alberts, MD - Primary  PHYSICIAN ASSISTANT: Ellwood Handler PA-C   ASSISTANTS: Alcide Evener RNFA   ANESTHESIA:   general  EBL:  360 mL  BLOOD ADMINISTERED: CELLSAVER  DRAINS: Mediastinal Chest Drains   LOCAL MEDICATIONS USED:  NONE  SPECIMEN:  Source of Specimen:  Mitral Valve Leaflets  DISPOSITION OF SPECIMEN:  PATHOLOGY  COUNTS:  YES  TOURNIQUET:  * No tourniquets in log *  DICTATION: .Dragon Dictation  PLAN OF CARE: Admit to inpatient   PATIENT DISPOSITION:  ICU - intubated and hemodynamically stable.   Delay start of Pharmacological VTE agent (>24hrs) due to surgical blood loss or risk of bleeding: yes

## 2020-10-28 NOTE — Progress Notes (Signed)
TCTS Evening Rounds  DOS s/p MVR/maze V-pacing Good BP but lower CI PE: BP 104/71   Pulse 80   Temp (!) 95.72 F (35.4 C)   Resp 12   Ht 5\' 4"  (1.626 m)   Wt 66.2 kg   SpO2 100%   BMI 25.04 kg/m  Sedated RRR CTA  Ok chest tube output  A/p: Add DA Pace faster for CI F/u labs. Breion Novacek Z. Orvan Seen, [place]

## 2020-10-28 NOTE — Transfer of Care (Signed)
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 (N/A Chest) CLIPPING OF ATRIAL APPENDAGE USING ATRICURE  CLIP SIZE 45MM (N/A Chest) TRANSESOPHAGEAL ECHOCARDIOGRAM (TEE) (N/A )  Patient Location: ICU  Anesthesia Type:General  Level of Consciousness: Patient remains intubated per anesthesia plan  Airway & Oxygen Therapy: Patient remains intubated per anesthesia plan and Patient placed on Ventilator (see vital sign flow sheet for setting)  Post-op Assessment: Report given to RN and Post -op Vital signs reviewed and stable  Post vital signs: Reviewed and stable  Last Vitals:  Vitals Value Taken Time  BP 104/62 10/28/20 1356  Temp    Pulse 80 10/28/20 1408  Resp 14 10/28/20 1408  SpO2 99 % 10/28/20 1408  Vitals shown include unvalidated device data.  Last Pain:  Vitals:   10/28/20 0510  TempSrc: Oral  PainSc:       Patients Stated Pain Goal: 2 (49/67/59 1638)  Complications: No complications documented.

## 2020-10-28 NOTE — Progress Notes (Signed)
      [address]       [place], [address]             [phone]     CARDIOTHORACIC SURGERY PROGRESS NOTE  Subjective: March Paola Aleshire has been scheduled for MITRAL VALVE REPAIR OR REPLACEMENT WITH POSSIBLE TRICUSPID VALVE REPAIR AND CLIPPING OF LEFT ATRIAL APPENDAGE today.   Objective: Vital signs in last 24 hours: Temp:  [98.5 F (36.9 C)-98.7 F (37.1 C)] 98.7 F (37.1 C) (05/08 2042) Pulse Rate:  [48-70] 70 (05/08 2042) Cardiac Rhythm: Atrial fibrillation (05/08 1950) Resp:  [17-19] 18 (05/08 2042) BP: (116-137)/(63-71) 135/68 (05/08 2042) SpO2:  [96 %-98 %] 96 % (05/08 2042) Weight:  [66.7 kg] 66.7 kg (05/08 0508)  Physical Exam: Unchanged from previously   Intake/Output from previous day: 05/08 0701 - 05/09 0700 In: 556.4 [P.O.:280; I.V.:276.4] Out: 3550 [Urine:3550] Intake/Output this shift: Total I/O In: 175.8 [P.O.:100; I.V.:75.8] Out: 1800 [Urine:1800]  Lab Results: Recent Labs    10/26/20 0258 10/27/20 0155  WBC 8.2 6.6  HGB 9.4* 9.8*  HCT 28.3* 29.3*  PLT 165 174   BMET:  Recent Labs    10/26/20 0258 10/27/20 0155  NA 134* 134*  K 3.5 3.7  CL 99 100  CO2 24 25  GLUCOSE 113* 97  BUN 17 14  CREATININE 1.18* 1.16*  CALCIUM 8.6* 8.8*    CBG (last 3)  No results for input(s): GLUCAP in the last 72 hours. PT/INR:  No results for input(s): LABPROT, INR in the last 72 hours.  Assessment/Plan:   The various methods of treatment have been discussed with the patient. After consideration of the risks, benefits and treatment options the patient has consented to the planned procedure.   The patient has been seen and labs reviewed. There are no changes in the patient's condition to prevent proceeding with the planned procedure today.   Rexene Alberts, MD 10/28/2020 4:52 AM

## 2020-10-28 NOTE — Anesthesia Procedure Notes (Addendum)
Central Venous Catheter Insertion Performed by: Catalina Gravel, MD, anesthesiologist Start/End5/02/2021 7:05 AM, 10/28/2020 7:10 AM Patient location: Pre-op. Preanesthetic checklist: patient identified, IV checked, site marked, risks and benefits discussed, surgical consent, monitors and equipment checked, pre-op evaluation and timeout performed Position: Trendelenburg Hand hygiene performed  and maximum sterile barriers used  Total catheter length 100. PA cath was placed.Swan type:thermodilution PA Cath depth:50 Procedure performed without using ultrasound guided technique. Attempts: 1 Patient tolerated the procedure well with no immediate complications.

## 2020-10-28 NOTE — Procedures (Signed)
Extubation Procedure Note  Patient Details:   Name: Kalisa Girtman DOB: [birth year] MRN: [id number]   Airway Documentation:    Vent end date: (not recorded) Vent end time: (not recorded)   Evaluation  O2 sats: stable throughout Complications: No apparent complications Patient did tolerate procedure well. Bilateral Breath Sounds: Diminished   Yes  Xyla Leisner 10/28/2020, 10:31 PM

## 2020-10-28 NOTE — Plan of Care (Signed)
  Problem: Cardiac: Goal: Will achieve and/or maintain hemodynamic stability Outcome: Progressing   Problem: Clinical Measurements: Goal: Postoperative complications will be avoided or minimized Outcome: Progressing   Problem: Respiratory: Goal: Respiratory status will improve Outcome: Progressing   Problem: Urinary Elimination: Goal: Ability to achieve and maintain adequate renal perfusion and functioning will improve Outcome: Progressing   

## 2020-10-28 NOTE — Anesthesia Postprocedure Evaluation (Signed)
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  Last Vitals:  Vitals:   10/28/20 1549 10/28/20 1600  BP: 107/62 104/71  Pulse: 80 80  Resp: 12 17  Temp:  (!) 35.3 C  SpO2: 100% 100%    Last Pain:  Vitals:   10/28/20 1500  TempSrc: Core  PainSc:                  Catalina Gravel

## 2020-10-28 NOTE — Anesthesia Procedure Notes (Signed)
Procedure Name: Intubation Date/Time: 10/28/2020 8:18 AM Performed by: Reece Agar, CRNA Pre-anesthesia Checklist: Patient identified, Emergency Drugs available, Suction available and Patient being monitored Patient Re-evaluated:Patient Re-evaluated prior to induction Oxygen Delivery Method: Circle System Utilized Preoxygenation: Pre-oxygenation with 100% oxygen Induction Type: IV induction Ventilation: Mask ventilation without difficulty Laryngoscope Size: Mac and 3 Grade View: Grade I Tube type: Oral Tube size: 8.0 mm Number of attempts: 1 Airway Equipment and Method: Stylet and Oral airway Placement Confirmation: ETT inserted through vocal cords under direct vision,  positive ETCO2 and breath sounds checked- equal and bilateral Secured at: 21 cm Tube secured with: Tape Dental Injury: Teeth and Oropharynx as per pre-operative assessment

## 2020-10-28 NOTE — Progress Notes (Signed)
Before extubation parameters  NIF - -25cm H2O/VC- 0.9L ml with decent effort x 3 Pt extubated and on 4LPM [place] at this time with stable VS.  RT will continue to monitor.

## 2020-10-28 NOTE — Progress Notes (Signed)
  Echocardiogram Echocardiogram Transesophageal has been performed.  Joanna Hood 10/28/2020, 9:03 AM

## 2020-10-28 NOTE — Plan of Care (Signed)
  Problem: Clinical Measurements: Goal: Respiratory complications will improve Outcome: Completed/Met   Problem: Clinical Measurements: Goal: Will remain free from infection Outcome: Completed/Met

## 2020-10-28 NOTE — Hospital Course (Addendum)
History of Present Illness:  Patient is a 75 year old African-American female with history of mitral regurgitation, longstanding persistent atrial fibrillation on long-term anticoagulation using Eliquis, hypertension, chronic diastolic congestive heart failure, pulmonary hypertension, rheumatoid arthritis on long-term methotrexate and chronic anemia who has been referred for surgical consultation to discuss treatment options for management of severe mitral regurgitation.   Patient states that she has been told that she had a heart murmur since she was very young.  She does not recall ever being told that she had rheumatic fever or rheumatic heart disease.  She has a long history of exertional shortness of breath and originally presented with acute exacerbation of chronic diastolic congestive heart failure in the setting of severe hypertension with new onset atrial fibrillation in 2014.  She has been followed intermittently ever since by Dr. Gwenlyn Found.     The patient states that symptoms of shortness of breath have progressed considerably over the past year or 2.  Follow-up echocardiogram performed February 19, 2020 revealed normal left ventricular size and systolic function with severe left atrial enlargement and severe mitral regurgitation.  She was seen in follow-up several weeks ago at which time she complained that she got short of breath walking very short distances.  She also had significant lower extremity edema.  Her dose of Lasix was increased at that time and she was scheduled for elective transesophageal echocardiogram and diagnostic cardiac catheterization.  These were performed on 10/21/2020. TEE confirmed the presence of severe mitral regurgitation with severe left atrial enlargement.  Left ventricular ejection fraction was reported 55 to 60%.  There was evidence of moderately reduced right ventricular function and moderate tricuspid regurgitation.  Left and right heart catheterization revealed normal  coronary anatomy with no significant coronary artery disease.  There was severe pulmonary hypertension (PA pressures 80/32 mmHg) with preserved cardiac output.  Mean right atrial pressure was 16 mmHg.  The patient was admitted to the hospital post procedure.  Hospital Course:  Post catheterization the patient developed shortness of breath with decreased saturations.  She was felt to be in acute on chronic CHF.  She was also noted to have pulmonary hypertension. Cardiothoracic consultation was requested for intervention on her Mitral Valve.  She was evaluated by Dr. Roxy Manns who felt intervention would be appropriate.  However, she would need medical tune up by the advanced heart failure service.  The patient was treated with aggressive diuretics.  She underwent repeat right heart cath which showed elevated PA pressures and elevated PVR.  She was initiated on Milrinone therapy.  She was taken to the operating room on 10/28/2020.  She underwent Mitral Valve Replacement with a 29 Edwards Mosaic Bioprosthetic Valve.  She tolerated the procedure and was taken to the SICU in stable condition.  She was extubated the evening of surgery without difficulty.  During her stay in the ICU she was weaned off Dopamine on 5/10.  Advanced heart failure monitored the patient closely and assisted in management.  She was started on Coumadin for her mitral valve prosthesis. She was diuresed with IV Lasix for volume overloaded state.  Her chest tubes and arterial lines were removed without difficulty.  She developed a decreased Magnesium level and was treated with IV replacement.  She was started on Spironolactone for additional diuresis.  Her co-ox panel decreased, and she required increase in her Milrinone drip.  Her hemodynamics improved and her milrinone was stopped on 5/16.  She was started on Entresto which was titrated as BP allowed.  Her spironolactone dose was also titrated.  She was started on low dose Lasix.  She remains on  coumadin.  Her most recent INR is 2.0.  She will be discharged home on 4 mg of coumadin.   Her Co-OX panel decreased to 47 %.  She was observed overnight.  Her CO-OX improved to 49 %. The patients surgical incisions are healing without evidence of infection.  She is ambulating without difficulty.  The patient is medically stable for discharge home today.

## 2020-10-29 ENCOUNTER — Encounter (HOSPITAL_COMMUNITY): Payer: Self-pay | Admitting: Thoracic Surgery (Cardiothoracic Vascular Surgery)

## 2020-10-29 ENCOUNTER — Other Ambulatory Visit: Payer: Self-pay

## 2020-10-29 ENCOUNTER — Inpatient Hospital Stay (HOSPITAL_COMMUNITY): Payer: Medicare HMO

## 2020-10-29 DIAGNOSIS — I051 Rheumatic mitral insufficiency: Secondary | ICD-10-CM

## 2020-10-29 DIAGNOSIS — Z953 Presence of xenogenic heart valve: Secondary | ICD-10-CM

## 2020-10-29 LAB — BASIC METABOLIC PANEL
Anion gap: 7 (ref 5–15)
Anion gap: 8 (ref 5–15)
Anion gap: 6 (ref 5–15)
BUN: 9 mg/dL (ref 8–23)
BUN: 10 mg/dL (ref 8–23)
BUN: 10 mg/dL (ref 8–23)
CO2: 24 mmol/L (ref 22–32)
CO2: 24 mmol/L (ref 22–32)
CO2: 23 mmol/L (ref 22–32)
Calcium: 8.2 mg/dL — ABNORMAL LOW (ref 8.9–10.3)
Calcium: 8.3 mg/dL — ABNORMAL LOW (ref 8.9–10.3)
Calcium: 8.6 mg/dL — ABNORMAL LOW (ref 8.9–10.3)
Chloride: 107 mmol/L (ref 98–111)
Chloride: 105 mmol/L (ref 98–111)
Chloride: 106 mmol/L (ref 98–111)
Creatinine, Ser: 0.73 mg/dL (ref 0.44–1.00)
Creatinine, Ser: 0.73 mg/dL (ref 0.44–1.00)
Creatinine, Ser: 0.91 mg/dL (ref 0.44–1.00)
GFR, Estimated: >60 mL/min (ref >60)
GFR, Estimated: >60 mL/min (ref >60)
GFR, Estimated: >60 mL/min (ref >60)
Glucose, Bld: 122 mg/dL — ABNORMAL HIGH (ref 70–99)
Glucose, Bld: 131 mg/dL — ABNORMAL HIGH (ref 70–99)
Glucose, Bld: 156 mg/dL — ABNORMAL HIGH (ref 70–99)
Potassium: 3.6 mmol/L (ref 3.5–5.1)
Potassium: 3.6 mmol/L (ref 3.5–5.1)
Potassium: 4.2 mmol/L (ref 3.5–5.1)
Sodium: 138 mmol/L (ref 135–145)
Sodium: 137 mmol/L (ref 135–145)
Sodium: 135 mmol/L (ref 135–145)

## 2020-10-29 LAB — GLUCOSE, CAPILLARY
Glucose-Capillary: 106 mg/dL — ABNORMAL HIGH (ref 70–99)
Glucose-Capillary: 119 mg/dL — ABNORMAL HIGH (ref 70–99)
Glucose-Capillary: 119 mg/dL — ABNORMAL HIGH (ref 70–99)
Glucose-Capillary: 126 mg/dL — ABNORMAL HIGH (ref 70–99)
Glucose-Capillary: 132 mg/dL — ABNORMAL HIGH (ref 70–99)
Glucose-Capillary: 136 mg/dL — ABNORMAL HIGH (ref 70–99)
Glucose-Capillary: 138 mg/dL — ABNORMAL HIGH (ref 70–99)
Glucose-Capillary: 148 mg/dL — ABNORMAL HIGH (ref 70–99)
Glucose-Capillary: 153 mg/dL — ABNORMAL HIGH (ref 70–99)
Glucose-Capillary: 155 mg/dL — ABNORMAL HIGH (ref 70–99)
Glucose-Capillary: 161 mg/dL — ABNORMAL HIGH (ref 70–99)

## 2020-10-29 LAB — COOXEMETRY PANEL
Carboxyhemoglobin: 1.4 % (ref 0.5–1.5)
Methemoglobin: 0.9 % (ref 0.0–1.5)
O2 Saturation: 66.6 %
Total hemoglobin: 8.6 g/dL — ABNORMAL LOW (ref 12.0–16.0)

## 2020-10-29 LAB — CBC
HCT: 26.5 % — ABNORMAL LOW (ref 36.0–46.0)
HCT: 28.6 % — ABNORMAL LOW (ref 36.0–46.0)
Hemoglobin: 8.8 g/dL — ABNORMAL LOW (ref 12.0–15.0)
Hemoglobin: 9.3 g/dL — ABNORMAL LOW (ref 12.0–15.0)
MCH: 27.8 pg (ref 26.0–34.0)
MCH: 27.5 pg (ref 26.0–34.0)
MCHC: 33.2 g/dL (ref 30.0–36.0)
MCHC: 32.5 g/dL (ref 30.0–36.0)
MCV: 83.6 fL (ref 80.0–100.0)
MCV: 84.6 fL (ref 80.0–100.0)
Platelets: 93 10*3/uL — ABNORMAL LOW (ref 150–400)
Platelets: 102 10*3/uL — ABNORMAL LOW (ref 150–400)
RBC: 3.17 MIL/uL — ABNORMAL LOW (ref 3.87–5.11)
RBC: 3.38 MIL/uL — ABNORMAL LOW (ref 3.87–5.11)
RDW: 15.2 % (ref 11.5–15.5)
RDW: 15.7 % — ABNORMAL HIGH (ref 11.5–15.5)
WBC: 21.9 10*3/uL — ABNORMAL HIGH (ref 4.0–10.5)
WBC: 31.3 10*3/uL — ABNORMAL HIGH (ref 4.0–10.5)
nRBC: 0 % (ref 0.0–0.2)
nRBC: 0 % (ref 0.0–0.2)

## 2020-10-29 LAB — MAGNESIUM
Magnesium: 2.3 mg/dL (ref 1.7–2.4)
Magnesium: 2.1 mg/dL (ref 1.7–2.4)

## 2020-10-29 LAB — SURGICAL PATHOLOGY

## 2020-10-29 MED ORDER — FE FUMARATE-B12-VIT C-FA-IFC PO CAPS
1.0000 | ORAL_CAPSULE | Freq: Every day | ORAL | Status: DC
  Administered 2020-10-31 – 2020-11-07 (×8): 1 via ORAL
  Filled 2020-10-29 (×9): qty 1

## 2020-10-29 MED ORDER — METOCLOPRAMIDE HCL 5 MG/ML IJ SOLN
10.0000 mg | Freq: Four times a day (QID) | INTRAMUSCULAR | Status: DC
  Filled 2020-10-29: qty 2

## 2020-10-29 MED ORDER — POTASSIUM CHLORIDE 20 MEQ PO PACK
40.0000 meq | PACK | Freq: Once | ORAL | Status: AC
  Administered 2020-10-29: 40 meq via ORAL
  Filled 2020-10-29: qty 2

## 2020-10-29 MED ORDER — ORAL CARE MOUTH RINSE
15.0000 mL | Freq: Two times a day (BID) | OROMUCOSAL | Status: DC
  Administered 2020-10-29 – 2020-11-07 (×18): 15 mL via OROMUCOSAL

## 2020-10-29 MED ORDER — ASPIRIN EC 325 MG PO TBEC
325.0000 mg | DELAYED_RELEASE_TABLET | Freq: Every day | ORAL | Status: AC
  Administered 2020-10-29: 325 mg via ORAL
  Filled 2020-10-29: qty 1

## 2020-10-29 MED ORDER — ENOXAPARIN SODIUM 30 MG/0.3ML IJ SOSY
30.0000 mg | PREFILLED_SYRINGE | Freq: Every day | INTRAMUSCULAR | Status: DC
  Administered 2020-10-30: 30 mg via SUBCUTANEOUS
  Filled 2020-10-29: qty 0.3

## 2020-10-29 MED ORDER — FUROSEMIDE 10 MG/ML IJ SOLN
40.0000 mg | Freq: Two times a day (BID) | INTRAMUSCULAR | Status: DC
  Administered 2020-10-29 – 2020-11-03 (×10): 40 mg via INTRAVENOUS
  Filled 2020-10-29 (×10): qty 4

## 2020-10-29 MED ORDER — BOOST / RESOURCE BREEZE PO LIQD CUSTOM
1.0000 | Freq: Three times a day (TID) | ORAL | Status: DC
  Administered 2020-10-30 – 2020-11-03 (×6): 1 via ORAL

## 2020-10-29 MED ORDER — INSULIN ASPART 100 UNIT/ML IJ SOLN
0.0000 [IU] | INTRAMUSCULAR | Status: DC
  Administered 2020-10-29: 4 [IU] via SUBCUTANEOUS
  Administered 2020-10-29 – 2020-10-30 (×4): 2 [IU] via SUBCUTANEOUS

## 2020-10-29 MED ORDER — METOCLOPRAMIDE HCL 5 MG/ML IJ SOLN
10.0000 mg | Freq: Four times a day (QID) | INTRAMUSCULAR | Status: DC
  Administered 2020-10-29 – 2020-11-07 (×28): 10 mg via INTRAVENOUS
  Filled 2020-10-29 (×28): qty 2

## 2020-10-29 MED ORDER — WARFARIN SODIUM 2.5 MG PO TABS
2.5000 mg | ORAL_TABLET | Freq: Every day | ORAL | Status: DC
  Administered 2020-10-29: 2.5 mg via ORAL
  Filled 2020-10-29: qty 1

## 2020-10-29 MED ORDER — POTASSIUM CHLORIDE 10 MEQ/50ML IV SOLN
10.0000 meq | INTRAVENOUS | Status: AC
  Administered 2020-10-29 (×4): 10 meq via INTRAVENOUS
  Filled 2020-10-29 (×4): qty 50

## 2020-10-29 MED ORDER — WARFARIN - PHYSICIAN DOSING INPATIENT: Freq: Every day | Status: DC

## 2020-10-29 NOTE — Progress Notes (Addendum)
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  Pain controlled.   Objective: Vital signs in last 24 hours: Temp:  [94.3 F (34.6 C)-98.4 F (36.9 C)] 97.7 F (36.5 C) (05/10 0700) Pulse Rate:  [77-93] 92 (05/10 0700) Cardiac Rhythm: Ventricular paced (05/09 2000) Resp:  [12-29] 20 (05/10 0700) BP: (99-115)/(54-77) 110/58 (05/10 0700) SpO2:  [96 %-100 %] 100 % (05/10 0700) FiO2 (%):  [40 %-50 %] 40 % (05/09 2159) Weight:  [76.2 kg] 76.2 kg (05/10 0413)  Hemodynamic parameters for last 24 hours: PAP: (44-69)/(20-32) 56/21 CO:  [2 L/min-4.5 L/min] 4.5 L/min CI:  [1.2 L/min/m2-2.6 L/min/m2] 2.6 L/min/m2  Intake/Output from previous day: 05/09 0701 - 05/10 0700 In: 6099.9 [I.V.:3364.1; Blood:440; IV Piggyback:2295.8] Out: 1308 [Urine:4350; Blood:800; Chest Tube:855] Intake/Output this shift: Total I/O In: -  Out: 130 [Urine:40; Chest Tube:90]  General appearance: alert, cooperative and no distress Heart: irregularly irregular rhythm Lungs: clear to auscultation bilaterally Abdomen: soft, non-tender; bowel sounds normal; no masses,  no organomegaly Extremities: edema trace Wound: clean and dry, aquacel in place  Lab Results: Recent Labs    10/28/20 2005 10/28/20 2217 10/28/20 2334 10/29/20 0450  WBC 23.5*  --   --  21.9*  HGB 10.2*   < > 10.9* 8.8*  HCT 30.5*   < > 32.0* 26.5*  PLT 112*  --   --  93*   < > = values in this interval not displayed.   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 CV- A. Fib slow rate- remains on Milrinone, Dopamine, Coox- 66.6- AHF was seeing prior to surgery, coumadin for MV 2. Pulm- no pneumothorax, + atelectasis, 855 cc output since surgery, leave chest tubes in place 3. Renal-creatinine stable, K WNL, mild volume overload, hold off on Lasix for now 4. Expected post operative blood loss anemia, moderate Hgb at 8.8 5. Expected post operative thrombocytopenia, monitor K at 93 6. CBGs controlled, off insulin drip, start SSIP 7. Dispo- patient in rate controlled A. Fib on post operative EKG, remains on Mirlinone and Dopamine, wean as hemodynamics allow, coumadin started on MV prosthesis, leave chest tubes in place for now, hold off on Lasix, POD #1 progression orders per Dr. Roxy Manns   LOS: 6 days  Ellwood Handler, PA-C 10/29/2020   I have seen and examined the patient and agree with the assessment and plan as outlined.  Doing well POD1.  Rate-controlled Afib - VVI pacing to increase HR.  Stable hemodynamics on milrinone 0.375 and low dose dopamine.  Expected post op acute blood loss anemia, Hgb 8.8.  Acute on chronic combined systolic and diastolic CHF with expected post-op volume excess, weight reportedly 9-10 kg > preop.   Mobilize  D/C lines  Wean milrinone slowly  Hold diuretics until later today or tomorrow until BP and filling pressures increase  Start Coumadin   Rexene Alberts, MD 10/29/2020 11:07 AM

## 2020-10-29 NOTE — Addendum Note (Signed)
Addendum  created 10/29/20 0649 by Wilburn Cornelia, CRNA   Order list changed

## 2020-10-29 NOTE — Progress Notes (Addendum)
Advanced Heart Failure Rounding Note  PCP-Cardiologist: Dr. Gwenlyn Found AHF: Dr. [name]   Patient Profile   [name], followed by Dr. Gwenlyn Found, w/ h/o chronic diastolic heart failure w/ predominant RV dysfunction + pulmonary hypertension, chronic atrial fibrillation on Eliquis, severe mitral regurgitation, systemic HTN and rheumatoid arthritis on Methotrexate, being evaluated for mitral valve repair vs replacement.   Subjective:    5/5 Underwent upper teeth extraction 5/6 RHC -> started on milrinone for elevated PA pressures and elevated PVR.  5/9 S/P bioprosthetic MVR Clipping atrial appendage.   Remains on milrinone 0.3 + dopamine 2.5   PA 58/21 CO 6.5 CI 2.65   CO-OX 67%. CVP 18   Feels ok.    Objective:   Weight Range: 76.2 kg Body mass index is 28.84 kg/m.   Vital Signs:   Temp:  [94.3 F (34.6 C)-98.4 F (36.9 C)] 98.1 F (36.7 C) (05/10 0900) Pulse Rate:  [77-93] 80 (05/10 0900) Resp:  [12-29] 27 (05/10 0900) BP: (99-125)/(54-77) 125/62 (05/10 0900) SpO2:  [96 %-100 %] 100 % (05/10 0900) FiO2 (%):  [40 %-50 %] 40 % (05/09 2159) Weight:  [76.2 kg] 76.2 kg (05/10 0413) Last BM Date: 10/20/20  Weight change: Filed Weights   10/27/20 0508 10/28/20 0510 10/29/20 0413  Weight: 66.7 kg 66.2 kg 76.2 kg    Intake/Output:   Intake/Output Summary (Last 24 hours) at 10/29/2020 0912 Last data filed at 10/29/2020 0900 Gross per 24 hour  Intake 6031.18 ml  Output 6135 ml  Net -103.82 ml      Physical Exam  General:  No resp difficulty HEENT: normal Neck: supple. JVP 8-9 . Carotids 2+ bilat; no bruits. No lymphadenopathy or thryomegaly appreciated. LIJ  Cor: PMI nondisplaced. Irregular rate & rhythm. No rubs, gallops or murmurs. Sternal dressing.  Lungs: clear Abdomen: soft, nontender, nondistended. No hepatosplenomegaly. No bruits or masses. Good bowel sounds. Extremities: no cyanosis, clubbing, rash, edema Neuro: alert & orientedx3, cranial nerves grossly  intact. moves all 4 extremities w/o difficulty. Affect pleasant   Telemetry    A fib 50-60s   Labs    CBC Recent Labs    10/28/20 2005 10/28/20 2217 10/28/20 2334 10/29/20 0450  WBC 23.5*  --   --  21.9*  HGB 10.2*   < > 10.9* 8.8*  HCT 30.5*   < > 32.0* 26.5*  MCV 82.4  --   --  83.6  PLT 112*  --   --  93*   < > = values in this interval not displayed.   Basic Metabolic Panel Recent Labs    10/28/20 2005 10/28/20 2217 10/29/20 0209 10/29/20 0450  NA 139   < > 138 137  K 3.8   < > 3.6 3.6  CL 108  --  107 105  CO2 24  --  24 24  GLUCOSE 147*  --  122* 131*  BUN 10  --  9 10  CREATININE 0.76  --  0.73 0.73  CALCIUM 8.3*  --  8.2* 8.3*  MG 3.0*  --  2.3  --    < > = values in this interval not displayed.   Liver Function Tests Recent Labs    10/27/20 0155  AST 29  ALT 25  ALKPHOS 65  BILITOT 0.7  PROT 5.7*  ALBUMIN 2.8*   No results for input(s): LIPASE, AMYLASE in the last 72 hours. Cardiac Enzymes No results for input(s): CKTOTAL, CKMB, CKMBINDEX, TROPONINI in the last 72  hours.  BNP: BNP (last 3 results) Recent Labs    10/23/20 0006 10/27/20 0155  BNP 490.4* 149.3*    ProBNP (last 3 results) No results for input(s): PROBNP in the last 8760 hours.   D-Dimer No results for input(s): DDIMER in the last 72 hours. Hemoglobin A1C Recent Labs    10/28/20 0127  HGBA1C 5.0   Fasting Lipid Panel No results for input(s): CHOL, HDL, LDLCALC, TRIG, CHOLHDL, LDLDIRECT in the last 72 hours. Thyroid Function Tests No results for input(s): TSH, T4TOTAL, T3FREE, THYROIDAB in the last 72 hours.  Invalid input(s): FREET3  Other results:   Imaging    DG Chest Port 1 View  Result Date: 10/29/2020 CLINICAL DATA:  Chest tube EXAM: PORTABLE CHEST 1 VIEW COMPARISON:  10/28/2020 FINDINGS: Interval extubation and removal of NG tube. Swan-Ganz catheter and bilateral chest tubes remain in place. No pneumothorax. Mild vascular congestion. Left base  atelectasis. IMPRESSION: Interval extubation. Mild vascular congestion and left base atelectasis. No pneumothorax. Electronically Signed   By: Rolm Baptise M.D.   On: 10/29/2020 08:22   DG Chest Port 1 View  Result Date: 10/28/2020 CLINICAL DATA:  Post MVR EXAM: PORTABLE CHEST 1 VIEW COMPARISON:  Portable exam 1420 hours compared to 10/22/2020 FINDINGS: Tip of endotracheal tube projects 3.0 cm above carina. Nasogastric tube extends into stomach. LEFT jugular Swan-Ganz catheter tip projects over RIGHT pulmonary artery. Mediastinal drain and BILATERAL thoracostomy tubes noted. Epicardial pacing wires. Upper normal size of cardiac silhouette post MVR and LEFT atrial appendage clipping. Atherosclerotic calcification aorta. Subsegmental atelectasis at mid RIGHT lung. No acute infiltrate, pleural effusion, or pneumothorax. Osseous structures unremarkable. IMPRESSION: Postoperative changes as above. Electronically Signed   By: Lavonia Dana M.D.   On: 10/28/2020 14:51     Medications:     Scheduled Medications: . acetaminophen  1,000 mg Oral Q6H  . aspirin EC  325 mg Oral Daily  . bisacodyl  10 mg Oral Daily   Or  . bisacodyl  10 mg Rectal Daily  . Chlorhexidine Gluconate Cloth  6 each Topical Daily  . docusate sodium  200 mg Oral Daily  . [START ON 10/30/2020] enoxaparin (LOVENOX) injection  30 mg Subcutaneous QHS  . [START ON 10/31/2020] ferrous [id number] C-folic acid  1 capsule Oral Q breakfast  . insulin aspart  0-24 Units Subcutaneous Q4H  . levothyroxine  125 mcg Oral Q0600  . mouth rinse  15 mL Mouth Rinse BID  . [START ON 10/30/2020] pantoprazole  40 mg Oral Daily  . sodium chloride flush  10-40 mL Intracatheter Q12H  . sodium chloride flush  3 mL Intravenous Q12H  . warfarin  2.5 mg Oral q1600  . Warfarin - Physician Dosing Inpatient   Does not apply q1600    Infusions: . sodium chloride    .  ceFAZolin (ANCEF) IV 200 mL/hr at 10/29/20 0900  . DOPamine 2.5 mcg/kg/min  (10/29/20 0900)  . lactated ringers Stopped (10/28/20 1515)  . lactated ringers Stopped (10/28/20 1515)  . milrinone 0.3 mcg/kg/min (10/29/20 0900)  . phenylephrine (NEO-SYNEPHRINE) Adult infusion Stopped (10/29/20 0509)  . potassium chloride 50 mL/hr at 10/29/20 0900    PRN Medications: morphine injection, ondansetron (ZOFRAN) IV, oxyCODONE, sodium chloride flush, sodium chloride flush, traMADol    Assessment/Plan   1. Severe Rheumatic Mitral Valve Regurgitation --> 10/28/20 S/P MVR - Primary MR. LVEF normal 60-65%  - TEE this admit confirmed severe MR. LHC w/ normal coronaries -S/P Bioprosthetic MVR 5/9 . - Started coumadin  today per CT surgery.  - Milrinone cut back to 0.3 mcg and remains on dopamine 2.5 mcg.  - CO 6.5 CI 2.6  - Set up CVP. Follow CO-OX    2. Severe Pulmonary Hypertension  - TTE 8/21 w/ severely elevated RVSP, 78 mmHg. 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 WHO Group 1 2/2 CTD (RA)  - Repeat RHC on 5/6 with improved hemodynamics but PA pressures and PVR still elevated.  - Continue milrinone.   3. Acute on Chronic Diastolic Heart Failure w/ Predominant RV Dysfunction  - 2D Echo 8/21 showed normal LVEF, 60-65%, w/ mild LVH and G3DD c/w restrictive physiology. RV was mildly reduced at the time - TEE 10/21/20 LVEF normal. RV systolic function is now moderately reduced w/ mild-mod TR, in setting of progressive/ now severe MR. - Set up CVP.  - Continue ? blocker and home antihypertensives for BP/HR control   4. Chronic Afib: in setting of severe MR w/ severe LAE. - rate controlled. Started coumadin today per Dr Roxy Manns.  -  hgb 11 -> 9.4->8.8  . No overt bleeding. Follow closely   5. Rheumatoid Arthritis - on MTX - followed by rheumatology   6. Hyponatremia - Sodium 137 today  OOB.    Length of Stay: [place], NP  10/29/2020, 9:12 AM  Advanced Heart Failure Team Pager [phone] (M-F; 7a - 5p)  Please contact [facility] for night-coverage after hours (5p -7a ) and weekends on [email or web address]  Addendum:  CVP 17-18. Give 40 mg IV lasix twice a day. Give 40 meq K dur now.   Darrick Grinder, NP   Patient seen and examined with the above-signed Advanced Practice Provider and/or Housestaff. I personally reviewed laboratory data, imaging studies and relevant notes. I independently examined the patient and formulated the important aspects of the plan. I have edited the note to reflect any of my changes or salient points. I have personally discussed the plan with the patient and/or family.  She is s/p MVR yesterday. Extubated. Sitting up in chair. On milrinone and DA. CVP 18. Co-ox ok. Denies SOB. Starting to pass gas. CTs with minimal drainage.   General:  Sitting up in chair.  No resp difficulty HEENT: normal Neck: supple. no JVD. + LIJ cath Carotids 2+ bilat; no bruits. No lymphadenopathy or thryomegaly appreciated. Cor: sternal wound ok  + CTs  Regular rate & rhythm.  Lungs: decreased I basese Abdomen: soft, nontender, nondistended. No hepatosplenomegaly. No bruits or masses. hypoactive bowel sounds. Extremities: no cyanosis, clubbing, rash, tr-1+ edema Neuro: alert & orientedx3, cranial nerves grossly intact. moves all 4 extremities w/o difficulty. Affect pleasant   Hemodynamics stable on milrinone and DA. Co-ox 67% Now V-pacing CVP 18. Renal function stable. Will start IV lasix. Continue to mobilize.   Glori Bickers, MD  2:53 PM

## 2020-10-29 NOTE — Plan of Care (Signed)
  Problem: Education: Goal: Knowledge of General Education information will improve Description: Including pain rating scale, medication(s)/side effects and non-pharmacologic comfort measures Outcome: Progressing   Problem: Health Behavior/Discharge Planning: Goal: Ability to manage health-related needs will improve Outcome: Progressing   Problem: Clinical Measurements: Goal: Ability to maintain clinical measurements within normal limits will improve Outcome: Progressing Goal: Diagnostic test results will improve Outcome: Progressing Goal: Cardiovascular complication will be avoided Outcome: Progressing   Problem: Activity: Goal: Capacity to carry out activities will improve Outcome: Progressing   Problem: Cardiac: Goal: Ability to achieve and maintain adequate cardiopulmonary perfusion will improve Outcome: Progressing   Problem: Education: Goal: Knowledge of disease or condition will improve Outcome: Progressing Goal: Knowledge of the prescribed therapeutic regimen will improve Outcome: Progressing   Problem: Activity: Goal: Risk for activity intolerance will decrease Outcome: Progressing   Problem: Cardiac: Goal: Will achieve and/or maintain hemodynamic stability Outcome: Progressing   Problem: Clinical Measurements: Goal: Postoperative complications will be avoided or minimized Outcome: Progressing   Problem: Respiratory: Goal: Respiratory status will improve Outcome: Progressing

## 2020-10-29 NOTE — Progress Notes (Signed)
Patient ID: Joanna Hood, female   DOB: September 25, 1945, 75 y.o.   MRN: [id number]  TCTS Evening Rounds:   Hemodynamically stable on dopamine 2.5, milrinone 0.3 Sinus rhythm.  Urine output marginal today but diuresing some this evening with lasix. CT output low  CBC    Component Value Date/Time   WBC 31.3 (H) 10/29/2020 1620   RBC 3.38 (L) 10/29/2020 1620   HGB 9.3 (L) 10/29/2020 1620   HGB 11.4 10/14/2020 1158   HCT 28.6 (L) 10/29/2020 1620   HCT 34.7 10/14/2020 1158   PLT 102 (L) 10/29/2020 1620   PLT 157 10/14/2020 1158   MCV 84.6 10/29/2020 1620   MCV 81 10/14/2020 1158   MCH 27.5 10/29/2020 1620   MCHC 32.5 10/29/2020 1620   RDW 15.7 (H) 10/29/2020 1620   RDW 14.8 10/14/2020 1158   LYMPHSABS 0.8 09/10/2020 0825   MONOABS 0.4 09/10/2020 0825   EOSABS 0.1 09/10/2020 0825   BASOSABS 0.1 09/10/2020 0825     BMET    Component Value Date/Time   NA 135 10/29/2020 1620   NA 141 10/14/2020 1158   K 4.2 10/29/2020 1620   CL 106 10/29/2020 1620   CO2 23 10/29/2020 1620   GLUCOSE 156 (H) 10/29/2020 1620   BUN 10 10/29/2020 1620   BUN 10 10/14/2020 1158   CREATININE 0.91 10/29/2020 1620   CREATININE 0.81 09/01/2013 1512   CALCIUM 8.6 (L) 10/29/2020 1620   GFRNONAA >60 10/29/2020 1620   GFRAA 79 (L) 05/03/2013 0455     A/P:  Stable postop course. Continue current plans

## 2020-10-29 NOTE — Progress Notes (Signed)
Nutrition Follow Up  DOCUMENTATION CODES:   Not applicable  INTERVENTION:   No BM since 5/1? Regimen in place   Boost Breeze po TID, each supplement provides 250 kcal and 9 grams of protein  MVI daily   NUTRITION DIAGNOSIS:   Increased nutrient needs related to post-op healing as evidenced by estimated needs.  Ongoing  GOAL:   Patient will meet greater than or equal to 90% of their needs   Progressing   MONITOR:   PO intake,Supplement acceptance,Diet advancement,Labs,Weight trends,Skin,I & O's  REASON FOR ASSESSMENT:   Consult Assessment of nutrition requirement/status  ASSESSMENT:   [name], followed by Dr. Gwenlyn Found, w/ h/o chronic diastolic heart failure w/ predominant RV dysfunction + pulmonary hypertension, chronic atrial fibrillation on Eliquis, severe mitral regurgitation, systemic HTN and rheumatoid arthritis on Methotrexate, being evaluated for mitral valve repair vs replacement.  5/2- s/p TEE- moderate RV dysfunction; mild bileaflet MVP with severe MR; mild to moderate TR. 5/5- s/p extraction of multiple teeth 5/9- s/p MVR   Patient reports she has been able to tolerate water and jello post op. Willing to take Boost for extra protein. Denies loss in appetite PTA. Typically consumes two to three meals daily. Denies weight loss.   Admission weight: 68.3 kg  Current weight: 76.2 kg   UOP: 4350 ml x 24 hrs  Chest tubes: 855 ml x 24 hrs   Medications: dulcolax, colace, 40 mg lasix BID, SS novolog Labs: CBG 106-161  Flowsheet Row Most Recent Value  Orbital Region No depletion  Upper Arm Region No depletion  Thoracic and Lumbar Region Unable to assess  Buccal Region No depletion  Temple Region Mild depletion  Clavicle Bone Region Mild depletion  Clavicle and Acromion Bone Region Mild depletion  Scapular Bone Region Unable to assess  Dorsal Hand No depletion  Patellar Region Mild depletion  Anterior Thigh Region Mild depletion  Posterior Calf Region  Mild depletion  Edema (RD Assessment) Mild  Hair Reviewed  Eyes Reviewed  Mouth Reviewed  Skin Reviewed  Nails Reviewed     Diet Order:   Diet Order            Diet clear liquid Room service appropriate? Yes; Fluid consistency: Thin  Diet effective now                 EDUCATION NEEDS:   No education needs have been identified at this time  Skin:  Skin Assessment: Skin Integrity Issues: Skin Integrity Issues:: Incisions Incisions: lip, chest, groin  Last BM:  5/1  Height:   Ht Readings from Last 1 Encounters:  10/28/20 5\' 4"  (1.626 m)    Weight:   Wt Readings from Last 1 Encounters:  10/29/20 76.2 kg    Ideal Body Weight:  54.5 kg  BMI:  Body mass index is 28.84 kg/m.  Estimated Nutritional Needs:   Kcal:  2050-2250  Protein:  105-120 grams  Fluid:  > 2 L  Mariana Single RD, LDN Clinical Nutrition Pager listed in [place]

## 2020-10-30 ENCOUNTER — Inpatient Hospital Stay (HOSPITAL_COMMUNITY): Payer: Medicare HMO

## 2020-10-30 ENCOUNTER — Inpatient Hospital Stay: Payer: Self-pay

## 2020-10-30 LAB — BASIC METABOLIC PANEL
Anion gap: 5 (ref 5–15)
BUN: 12 mg/dL (ref 8–23)
CO2: 24 mmol/L (ref 22–32)
Calcium: 8.6 mg/dL — ABNORMAL LOW (ref 8.9–10.3)
Chloride: 105 mmol/L (ref 98–111)
Creatinine, Ser: 0.97 mg/dL (ref 0.44–1.00)
GFR, Estimated: >60 mL/min (ref >60)
Glucose, Bld: 130 mg/dL — ABNORMAL HIGH (ref 70–99)
Potassium: 4.2 mmol/L (ref 3.5–5.1)
Sodium: 134 mmol/L — ABNORMAL LOW (ref 135–145)

## 2020-10-30 LAB — CBC
HCT: 25 % — ABNORMAL LOW (ref 36.0–46.0)
Hemoglobin: 8.2 g/dL — ABNORMAL LOW (ref 12.0–15.0)
MCH: 27.9 pg (ref 26.0–34.0)
MCHC: 32.8 g/dL (ref 30.0–36.0)
MCV: 85 fL (ref 80.0–100.0)
Platelets: 88 10*3/uL — ABNORMAL LOW (ref 150–400)
RBC: 2.94 MIL/uL — ABNORMAL LOW (ref 3.87–5.11)
RDW: 15.9 % — ABNORMAL HIGH (ref 11.5–15.5)
WBC: 27.1 10*3/uL — ABNORMAL HIGH (ref 4.0–10.5)
nRBC: 0 % (ref 0.0–0.2)

## 2020-10-30 LAB — GLUCOSE, CAPILLARY
Glucose-Capillary: 126 mg/dL — ABNORMAL HIGH (ref 70–99)
Glucose-Capillary: 156 mg/dL — ABNORMAL HIGH (ref 70–99)
Glucose-Capillary: 117 mg/dL — ABNORMAL HIGH (ref 70–99)
Glucose-Capillary: 120 mg/dL — ABNORMAL HIGH (ref 70–99)

## 2020-10-30 LAB — COOXEMETRY PANEL
Carboxyhemoglobin: 1.4 % (ref 0.5–1.5)
Methemoglobin: 0.8 % (ref 0.0–1.5)
O2 Saturation: 82.3 %
Total hemoglobin: 8.3 g/dL — ABNORMAL LOW (ref 12.0–16.0)

## 2020-10-30 LAB — MAGNESIUM: Magnesium: 1.9 mg/dL (ref 1.7–2.4)

## 2020-10-30 LAB — PROTIME-INR
INR: 1.8 — ABNORMAL HIGH (ref 0.8–1.2)
Prothrombin Time: 20.5 seconds — ABNORMAL HIGH (ref 11.4–15.2)

## 2020-10-30 MED ORDER — ~~LOC~~ CARDIAC SURGERY, PATIENT & FAMILY EDUCATION: Freq: Once | Status: AC

## 2020-10-30 MED ORDER — WARFARIN SODIUM 1 MG PO TABS
1.0000 mg | ORAL_TABLET | Freq: Every day | ORAL | Status: DC
  Administered 2020-10-30: 1 mg via ORAL
  Filled 2020-10-30: qty 1

## 2020-10-30 MED FILL — Sodium Bicarbonate IV Soln 8.4%: INTRAVENOUS | Qty: 50 | Status: AC

## 2020-10-30 MED FILL — Mannitol IV Soln 20%: INTRAVENOUS | Qty: 500 | Status: AC

## 2020-10-30 MED FILL — Potassium Chloride Inj 2 mEq/ML: INTRAVENOUS | Qty: 40 | Status: AC

## 2020-10-30 MED FILL — Sodium Chloride IV Soln 0.9%: INTRAVENOUS | Qty: 2000 | Status: AC

## 2020-10-30 MED FILL — Heparin Sodium (Porcine) Inj 1000 Unit/ML: INTRAMUSCULAR | Qty: 30 | Status: AC

## 2020-10-30 MED FILL — Electrolyte-R (PH 7.4) Solution: INTRAVENOUS | Qty: 4000 | Status: AC

## 2020-10-30 MED FILL — Heparin Sodium (Porcine) Inj 1000 Unit/ML: INTRAMUSCULAR | Qty: 10 | Status: AC

## 2020-10-30 MED FILL — Lidocaine HCl Local Preservative Free (PF) Inj 2%: INTRAMUSCULAR | Qty: 15 | Status: AC

## 2020-10-30 MED FILL — Calcium Chloride Inj 10%: INTRAVENOUS | Qty: 10 | Status: AC

## 2020-10-30 NOTE — Evaluation (Signed)
Physical Therapy Evaluation Patient Details Name: Joanna Hood MRN: [id number] DOB: 27-Jul-1945 Today's Date: 10/30/2020   History of Present Illness  pt is a 75 y/o female admitted 5/2 for evaluation of progressive dyspnea on exertion and increasing LE edema.  Work up for MVR vs replacement.  5/2 R and Left heart Cath with angiography,   5/5 multiple teeth extractions.  5/9  mitral valve replacement.  PMHx:  tricuspid regurg, RA, pulmonary HTN, ostioporosis, MVR, CHF, persistent Afib.  Clinical Impression  Pt admitted with/for evaluation of MVR or repair.  Pt s/p mitral valve replacement 5/9 and is needing minimal assist post op..  Pt currently limited functionally due to the problems listed below.  (see problems list.)  Pt will benefit from PT to maximize function and safety to be able to get home safely with available assist.     Follow Up Recommendations Home health PT;Supervision/Assistance - 24 hour;Supervision - Intermittent    Equipment Recommendations  Other (comment) (TBA as pt progresses)    Recommendations for Other Services       Precautions / Restrictions Precautions Precautions: Fall;Sternal (mild)      Mobility  Bed Mobility Overal bed mobility: Needs Assistance Bed Mobility: Rolling;Sidelying to Sit Rolling: Min assist Sidelying to sit: Min assist       General bed mobility comments: reinforced sternal precautions and best to roll to come up with momentum.    Transfers Overall transfer level: Needs assistance Equipment used: None Transfers: Sit to/from [facility] Sit to Stand: Min assist Stand pivot transfers: Min assist;Min guard       General transfer comment: pivotal steps over 3 feet, limited by [facility]  Ambulation/Gait             General Gait Details: transfer only  Financial trader Rankin (Stroke Patients Only)       Balance Overall balance assessment: Needs  assistance Sitting-balance support: No upper extremity supported;Feet supported;Feet unsupported Sitting balance-Leahy Scale: Good     Standing balance support: No upper extremity supported;Single extremity supported Standing balance-Leahy Scale: Fair                               Pertinent Vitals/Pain Pain Assessment: Faces Faces Pain Scale: Hurts a little bit Pain Location: sternal with movement Pain Descriptors / Indicators: Aching;Guarding Pain Intervention(s): Monitored during session    Home Living Family/patient expects to be discharged to:: Private residence Living Arrangements: Other relatives Available Help at Discharge: Family;Available 24 hours/day;Available PRN/intermittently Type of Home: House Home Access: Stairs to enter Entrance Stairs-Rails: Psychiatric nurse of Steps: 3-4 Home Layout: One level Home Equipment: Other (comment) (TBA)      Prior Function Level of Independence: Independent         Comments: drove, ran errands, kept up the home.     Hand Dominance        Extremity/Trunk Assessment   Upper Extremity Assessment Upper Extremity Assessment: RUE deficits/detail;LUE deficits/detail RUE Deficits / Details: functional within sternal prec parameters LUE Deficits / Details: functional within sternal precaution parameters    Lower Extremity Assessment Lower Extremity Assessment: Overall WFL for tasks assessed (mild proximal weakness from recent low mobility.)       Communication   Communication: No difficulties  Cognition Arousal/Alertness: Awake/alert Behavior During Therapy: WFL for tasks assessed/performed Overall Cognitive Status: Within Functional Limits  for tasks assessed                                        General Comments General comments (skin integrity, edema, etc.): Sats mid to up 90's on 2L [place] during mobility.  HR in the 90's and low 100's    Exercises      Assessment/Plan    PT Assessment Patient needs continued PT services  PT Problem List Decreased strength;Decreased activity tolerance;Decreased balance;Decreased mobility;Decreased knowledge of precautions;Pain       PT Treatment Interventions Gait training;Stair training;Functional mobility training;Therapeutic activities;Patient/family education;Balance training    PT Goals (Current goals can be found in the Care Plan section)  Acute Rehab PT Goals Patient Stated Goal: home independent PT Goal Formulation: With patient Time For Goal Achievement: 11/06/20 Potential to Achieve Goals: Good    Frequency Min 3X/week   Barriers to discharge        Co-evaluation               AM-PAC PT "6 Clicks" Mobility  Outcome Measure Help needed turning from your back to your side while in a flat bed without using bedrails?: A Little Help needed moving from lying on your back to sitting on the side of a flat bed without using bedrails?: A Little Help needed moving to and from a bed to a chair (including a wheelchair)?: A Little Help needed standing up from a chair using your arms (e.g., wheelchair or bedside chair)?: A Little Help needed to walk in hospital room?: A Little Help needed climbing 3-5 steps with a railing? : A Lot 6 Click Score: 17    End of Session Equipment Utilized During Treatment: Oxygen Activity Tolerance: Patient tolerated treatment well Patient left: in chair;with call bell/phone within reach Nurse Communication: Mobility status PT Visit Diagnosis: Other abnormalities of gait and mobility (R26.89);Difficulty in walking, not elsewhere classified (R26.2);Pain Pain - part of body:  (sternal)    Time: 1732-1800 PT Time Calculation (min) (ACUTE ONLY): 28 min   Charges:   PT Evaluation $PT Eval Moderate Complexity: 1 Mod PT Treatments $Therapeutic Activity: 8-22 mins        10/30/2020  Ginger Carne., PT Acute Rehabilitation Services [phone]   (pager) [phone]  (office)  Joanna Hood 10/30/2020, 6:19 PM

## 2020-10-30 NOTE — Progress Notes (Addendum)
      [address]       [place] [address]             [phone]      2 Days Post-Op Procedure(s) (LRB): MITRAL VALVE REPLACEMENT (MVR) USING MEDTRONIC MOSAIC VALVE SIZE 29MM (N/A) CLIPPING OF ATRIAL APPENDAGE USING ATRICURE  CLIP SIZE 45MM (N/A) TRANSESOPHAGEAL ECHOCARDIOGRAM (TEE) (N/A)   Subjective:    Objective: Vital signs in last 24 hours: Temp:  [97.6 F (36.4 C)-99.1 F (37.3 C)] 98.8 F (37.1 C) (05/11 0700) Pulse Rate:  [74-81] 77 (05/11 0800) Cardiac Rhythm: Normal sinus rhythm (05/11 0300) Resp:  [18-33] 20 (05/11 0800) BP: (104-150)/(45-70) 115/49 (05/11 0800) SpO2:  [97 %-100 %] 100 % (05/11 0800) Weight:  [73.6 kg] 73.6 kg (05/11 0500)  Hemodynamic parameters for last 24 hours: PAP: (60)/(22) 60/22 CVP:  [6 mmHg-18 mmHg] 9 mmHg  Intake/Output from previous day: 05/10 0701 - 05/11 0700 In: 653.6 [P.O.:30; I.V.:205.3; IV Piggyback:418.3] Out: 1458 [Urine:628; Emesis/NG output:230; Chest Tube:600] Intake/Output this shift: Total I/O In: 13.8 [I.V.:13.8] Out: 20 [Chest Tube:20]  General appearance: alert, cooperative and no distress Heart: irregularly irregular rhythm Lungs: clear to auscultation bilaterally Abdomen: soft, non-tender; bowel sounds normal; no masses,  no organomegaly Extremities: edema trace Wound: aquacel in place  Lab Results: Recent Labs    10/29/20 1620 10/30/20 0421  WBC 31.3* 27.1*  HGB 9.3* 8.2*  HCT 28.6* 25.0*  PLT 102* 88*   BMET:  Recent Labs    10/29/20 1620 10/30/20 0421  NA 135 134*  K 4.2 4.2  CL 106 105  CO2 23 24  GLUCOSE 156* 130*  BUN 10 12  CREATININE 0.91 0.97  CALCIUM 8.6* 8.6*    PT/INR:  Recent Labs    10/30/20 0421  LABPROT 20.5*  INR 1.8*   ABG    Component Value Date/Time   PHART 7.357 10/28/2020 2334   HCO3 24.8 10/28/2020 2334   TCO2 26 10/28/2020 2334   ACIDBASEDEF 1.0 10/28/2020 2334   O2SAT 82.3 10/30/2020 0421   CBG (last 3)  Recent Labs     10/29/20 2345 10/30/20 0338 10/30/20 0803  GLUCAP 119* 126* 156*    Assessment/Plan: S/P Procedure(s) (LRB): MITRAL VALVE REPLACEMENT (MVR) USING MEDTRONIC MOSAIC VALVE SIZE 29MM (N/A) CLIPPING OF ATRIAL APPENDAGE USING ATRICURE  CLIP SIZE 45MM (N/A) TRANSESOPHAGEAL ECHOCARDIOGRAM (TEE) (N/A)  1. CV- A. Fib, rate controlled- off Dopamine, weaning Milrinone as able, AHF following, Coox 82 this morning 2. Pulm-CTs removed this morning, wean oxygen as tolerated 3. INR 1.8, will repeat coumadin this evening at 2.5 mg daily 4. Renal- creatinine WNL, remains volume overloaded, Lasix ordered, K at 4.2 5. Expected post operative blood loss anemia, hgb at 8.2 6. Expected post operative thrombocytopenia down to 88K, monitor if continues to drop, will need to check HIT panel 7. CBGs remain controlled, not a diabetic, will change to AC/HS if remain stable, can d/c SSIP tomorrow 8. Dispo- patient stable, Dopamine off, Milrinone weaning as able, currently at 0.25, CTs have been removed, IV lasix ordered, continue coumadin, watch platelet count   LOS: 7 days    Ellwood Handler, PA-C 10/30/2020   I have seen and examined the patient and agree with the assessment and plan as outlined.  Rexene Alberts, MD 10/30/2020 10:53 AM

## 2020-10-30 NOTE — Progress Notes (Signed)
Peripherally Inserted Central Catheter Placement  The IV Nurse has discussed with the patient and/or persons authorized to consent for the patient, the purpose of this procedure and the potential benefits and risks involved with this procedure.  The benefits include less needle sticks, lab draws from the catheter, and the patient may be discharged home with the catheter. Risks include, but not limited to, infection, bleeding, blood clot (thrombus formation), and puncture of an artery; nerve damage and irregular heartbeat and possibility to perform a PICC exchange if needed/ordered by physician.  Alternatives to this procedure were also discussed.  Bard Power PICC patient education guide, fact sheet on infection prevention and patient information card has been provided to patient /or left at bedside.    PICC Placement Documentation  PICC Double Lumen 63/01/60 PICC Right Basilic 40 cm 0 cm (Active)  Indication for Insertion or Continuance of Line Vasoactive infusions 10/30/20 1249  Exposed Catheter (cm) 0 cm 10/30/20 1249  Site Assessment Clean;Dry;Intact 10/30/20 1249  Lumen #1 Status Flushed;Blood return noted 10/30/20 1249  Lumen #2 Status Flushed;Blood return noted 10/30/20 1249  Dressing Type Transparent 10/30/20 1249  Dressing Status Clean;Dry;Intact 10/30/20 1249  Antimicrobial disc in place? Yes 10/30/20 1249  Dressing Intervention New dressing;Other (Comment) 10/30/20 1249  Dressing Change Due 11/06/20 10/30/20 [place], Jyquan Kenley Albarece 10/30/2020, 12:51 PM

## 2020-10-30 NOTE — Progress Notes (Signed)
      [address]       [place] [address]             [phone]      POD # 2 MVR, LAA clip  Up in chair  BP (!) 118/57   Pulse 85   Temp 98.7 F (37.1 C) (Oral)   Resp (!) 24   Ht 5\' 4"  (1.626 m)   Wt 73.6 kg   SpO2 100%   BMI 27.85 kg/m  2L [place] 100%  Intake/Output Summary (Last 24 hours) at 10/30/2020 1938 Last data filed at 10/30/2020 1900 Gross per 24 hour  Intake 442.34 ml  Output 920 ml  Net -477.66 ml   CBG well controlled PM labs pending  Remo Lipps C. Roxan Hockey, MD [facility] [phone]

## 2020-10-30 NOTE — Progress Notes (Addendum)
Advanced Heart Failure Rounding Note  PCP-Cardiologist: Dr. Gwenlyn Found AHF: Dr. [name]   Patient Profile   [name], followed by Dr. Gwenlyn Found, w/ h/o chronic diastolic heart failure w/ predominant RV dysfunction + pulmonary hypertension, chronic atrial fibrillation on Eliquis, severe mitral regurgitation, systemic HTN and rheumatoid arthritis on Methotrexate, being evaluated for mitral valve repair vs replacement.   Subjective:    5/5 Underwent upper teeth extraction. Given Decadron.  5/6 RHC -> started on milrinone for elevated PA pressures and elevated PVR.  5/9 S/P bioprosthetic MVR Clipping atrial appendage.  5/10 Started on IV lasix. Milrinone cut back to 0.3.   WBC 27K   CO-OX 82%. CVP trending down to 13.   Denies pain.   Objective:   Weight Range: 73.6 kg Body mass index is 27.85 kg/m.   Vital Signs:   Temp:  [97.6 F (36.4 C)-99.1 F (37.3 C)] 98.8 F (37.1 C) (05/11 0700) Pulse Rate:  [74-81] 77 (05/11 0800) Resp:  [18-33] 20 (05/11 0800) BP: (104-150)/(45-70) 115/49 (05/11 0800) SpO2:  [97 %-100 %] 100 % (05/11 0800) Weight:  [73.6 kg] 73.6 kg (05/11 0500) Last BM Date: 10/20/20  Weight change: Filed Weights   10/28/20 0510 10/29/20 0413 10/30/20 0500  Weight: 66.2 kg 76.2 kg 73.6 kg    Intake/Output:   Intake/Output Summary (Last 24 hours) at 10/30/2020 0833 Last data filed at 10/30/2020 0800 Gross per 24 hour  Intake 551 ml  Output 1348 ml  Net -797 ml      Physical Exam  CVP 13  General: In bed.  No resp difficulty HEENT: normal Neck: supple. JVP 11-12. . Carotids 2+ bilat; no bruits. No lymphadenopathy or thryomegaly appreciated. LIJ Cor: PMI nondisplaced. Irregular rate & rhythm. No rubs, gallops or murmurs. Lungs: clear Abdomen: soft, nontender, nondistended. No hepatosplenomegaly. No bruits or masses. Good bowel sounds. Extremities: no cyanosis, clubbing, rash, edema Neuro: alert & orientedx3, cranial nerves grossly intact. moves  all 4 extremities w/o difficulty. Affect pleasant GU: Foley    Telemetry   A fib 70-80s   Labs    CBC Recent Labs    10/29/20 1620 10/30/20 0421  WBC 31.3* 27.1*  HGB 9.3* 8.2*  HCT 28.6* 25.0*  MCV 84.6 85.0  PLT 102* 88*   Basic Metabolic Panel Recent Labs    10/29/20 1620 10/30/20 0421  NA 135 134*  K 4.2 4.2  CL 106 105  CO2 23 24  GLUCOSE 156* 130*  BUN 10 12  CREATININE 0.91 0.97  CALCIUM 8.6* 8.6*  MG 2.1 1.9   Liver Function Tests No results for input(s): AST, ALT, ALKPHOS, BILITOT, PROT, ALBUMIN in the last 72 hours. No results for input(s): LIPASE, AMYLASE in the last 72 hours. Cardiac Enzymes No results for input(s): CKTOTAL, CKMB, CKMBINDEX, TROPONINI in the last 72 hours.  BNP: BNP (last 3 results) Recent Labs    10/23/20 0006 10/27/20 0155  BNP 490.4* 149.3*    ProBNP (last 3 results) No results for input(s): PROBNP in the last 8760 hours.   D-Dimer No results for input(s): DDIMER in the last 72 hours. Hemoglobin A1C Recent Labs    10/28/20 0127  HGBA1C 5.0   Fasting Lipid Panel No results for input(s): CHOL, HDL, LDLCALC, TRIG, CHOLHDL, LDLDIRECT in the last 72 hours. Thyroid Function Tests No results for input(s): TSH, T4TOTAL, T3FREE, THYROIDAB in the last 72 hours.  Invalid input(s): FREET3  Other results:   Imaging    [place] EKG SITE  RITE  Result Date: 10/30/2020 If Site Rite image not attached, placement could not be confirmed due to current cardiac rhythm.    Medications:     Scheduled Medications: . acetaminophen  1,000 mg Oral Q6H  . bisacodyl  10 mg Oral Daily   Or  . bisacodyl  10 mg Rectal Daily  . Chlorhexidine Gluconate Cloth  6 each Topical Daily  . [facility], Patient & Family Education   Does not apply Once  . docusate sodium  200 mg Oral Daily  . enoxaparin (LOVENOX) injection  30 mg Subcutaneous QHS  . feeding supplement  1 Container Oral TID BM  . [START ON 10/31/2020] ferrous  [id number] C-folic acid  1 capsule Oral Q breakfast  . furosemide  40 mg Intravenous BID  . levothyroxine  125 mcg Oral Q0600  . mouth rinse  15 mL Mouth Rinse BID  . metoCLOPramide (REGLAN) injection  10 mg Intravenous Q6H  . pantoprazole  40 mg Oral Daily  . sodium chloride flush  10-40 mL Intracatheter Q12H  . sodium chloride flush  3 mL Intravenous Q12H  . warfarin  1 mg Oral q1600  . Warfarin - Physician Dosing Inpatient   Does not apply q1600    Infusions: . sodium chloride    . DOPamine Stopped (10/30/20 0730)  . lactated ringers Stopped (10/28/20 1515)  . lactated ringers Stopped (10/28/20 1515)  . milrinone 0.25 mcg/kg/min (10/30/20 0800)    PRN Medications: morphine injection, ondansetron (ZOFRAN) IV, oxyCODONE, sodium chloride flush, sodium chloride flush, traMADol    Assessment/Plan   1. Severe Rheumatic Mitral Valve Regurgitation --> 10/28/20 S/P MVR - Primary MR. LVEF normal 60-65%  - TEE this admit confirmed severe MR. LHC w/ normal coronaries -S/P Bioprosthetic MVR 5/9 . - Started coumadin today per CT surgery.  - Planning for PICC today.  - CO-OX stable. Milrinone cut back to 0.25 mcg today.  - CVP 13. Continue IV lasix 40 mg BID.  - Renal function stable.   2. Severe Pulmonary Hypertension  - TTE 8/21 w/ severely elevated RVSP, 78 mmHg. 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 WHO Group 1 2/2 CTD (RA)  - Repeat RHC on 5/6 with improved hemodynamics but PA pressures and PVR still elevated.  - Weaning milrinone.   3. Acute on Chronic Diastolic Heart Failure w/ Predominant RV Dysfunction  - 2D Echo 8/21 showed normal LVEF, 60-65%, w/ mild LVH and G3DD c/w restrictive physiology. RV was mildly reduced at the time - TEE 10/21/20 LVEF normal. RV systolic function is now moderately  reduced w/ mild-mod TR, in setting of progressive/ now severe MR. - Diuresing as above.   4. Chronic Afib: in setting of severe MR w/ severe LAE. -In SR today.  Started coumadin today per Dr Roxy Manns.  -  hgb 11 -> 9.4->8.8->8.2   - INR 1.8 today  -  No overt bleeding. Follow closely   5. Rheumatoid Arthritis - on MTX - followed by rheumatology   6. Hyponatremia - Sodium 134 today  7. WBC  Had dose of decadron on 5/5 27 K  T Max 99.1    Length of Stay: 7  Amy Clegg, NP  10/30/2020, 8:33 AM  Advanced Heart Failure Team Pager [phone] (M-F; 7a - 5p)  Please contact  [facility] for night-coverage after hours (5p -7a ) and weekends on [email or web address]  Patient seen and examined with the above-signed Advanced Practice Provider and/or Housestaff. I personally reviewed laboratory data, imaging studies and relevant notes. I independently examined the patient and formulated the important aspects of the plan. I have edited the note to reflect any of my changes or salient points. I have personally discussed the plan with the patient and/or family.  Continues to progress post-op. Weight down 5 pounds overnight. Denies SOB, orthopnea or PND. WBC up. O(n milrinone 0.25   General:  Sitting in chair HEENT: normal Neck: supple. no JVD. Carotids 2+ bilat; no bruits. No lymphadenopathy or thryomegaly appreciated. Cor: Sternal wound ok. CTs ok . Regular rate & rhythm. No rubs, gallops or murmurs. Lungs: clear Abdomen: soft, nontender, nondistended. No hepatosplenomegaly. No bruits or masses. Hypoactive bowel sounds. Extremities: no cyanosis, clubbing, rash, 1+ edema Neuro: alert & orientedx3, cranial nerves grossly intact. moves all 4 extremities w/o difficulty. Affect pleasant  Agree with weaning milrinone to 0.25. Continue IV diuresis. Mobilize. Coumadin per TCTS.   Glori Bickers, MD  11:14 AM

## 2020-10-31 ENCOUNTER — Inpatient Hospital Stay (HOSPITAL_COMMUNITY): Payer: Medicare HMO

## 2020-10-31 DIAGNOSIS — Z953 Presence of xenogenic heart valve: Secondary | ICD-10-CM

## 2020-10-31 LAB — BPAM RBC
Blood Product Expiration Date: 202206012359
Blood Product Expiration Date: 202206032359
Blood Product Expiration Date: 202206032359
Blood Product Expiration Date: 202206032359
Blood Product Expiration Date: 202206052359
Blood Product Expiration Date: 202206052359
ISSUE DATE / TIME: 202205062311
ISSUE DATE / TIME: 202205062311
ISSUE DATE / TIME: 202205090834
ISSUE DATE / TIME: 202205090834
ISSUE DATE / TIME: 202205091524
ISSUE DATE / TIME: 202205102253
Unit Type and Rh: 5100
Unit Type and Rh: 5100
Unit Type and Rh: 5100
Unit Type and Rh: 5100
Unit Type and Rh: 5100
Unit Type and Rh: 5100

## 2020-10-31 LAB — CBC
HCT: 22.4 % — ABNORMAL LOW (ref 36.0–46.0)
Hemoglobin: 7.5 g/dL — ABNORMAL LOW (ref 12.0–15.0)
MCH: 27.7 pg (ref 26.0–34.0)
MCHC: 33.5 g/dL (ref 30.0–36.0)
MCV: 82.7 fL (ref 80.0–100.0)
Platelets: 89 10*3/uL — ABNORMAL LOW (ref 150–400)
RBC: 2.71 MIL/uL — ABNORMAL LOW (ref 3.87–5.11)
RDW: 15.9 % — ABNORMAL HIGH (ref 11.5–15.5)
WBC: 19.2 10*3/uL — ABNORMAL HIGH (ref 4.0–10.5)
nRBC: 0 % (ref 0.0–0.2)

## 2020-10-31 LAB — PROTIME-INR
INR: 2.5 — ABNORMAL HIGH (ref 0.8–1.2)
Prothrombin Time: 26.6 seconds — ABNORMAL HIGH (ref 11.4–15.2)

## 2020-10-31 LAB — MAGNESIUM: Magnesium: 1.7 mg/dL (ref 1.7–2.4)

## 2020-10-31 LAB — TYPE AND SCREEN
ABO/RH(D): O POS
Antibody Screen: NEGATIVE
Unit division: 0
Unit division: 0
Unit division: 0
Unit division: 0
Unit division: 0
Unit division: 0

## 2020-10-31 LAB — COMPREHENSIVE METABOLIC PANEL
ALT: 9 U/L (ref 0–44)
AST: 30 U/L (ref 15–41)
Albumin: 2.6 g/dL — ABNORMAL LOW (ref 3.5–5.0)
Alkaline Phosphatase: 56 U/L (ref 38–126)
Anion gap: 7 (ref 5–15)
BUN: 15 mg/dL (ref 8–23)
CO2: 24 mmol/L (ref 22–32)
Calcium: 8.4 mg/dL — ABNORMAL LOW (ref 8.9–10.3)
Chloride: 101 mmol/L (ref 98–111)
Creatinine, Ser: 0.87 mg/dL (ref 0.44–1.00)
GFR, Estimated: >60 mL/min (ref >60)
Glucose, Bld: 110 mg/dL — ABNORMAL HIGH (ref 70–99)
Potassium: 3.7 mmol/L (ref 3.5–5.1)
Sodium: 132 mmol/L — ABNORMAL LOW (ref 135–145)
Total Bilirubin: 1 mg/dL (ref 0.3–1.2)
Total Protein: 5 g/dL — ABNORMAL LOW (ref 6.5–8.1)

## 2020-10-31 LAB — COOXEMETRY PANEL
Carboxyhemoglobin: 1.7 % — ABNORMAL HIGH (ref 0.5–1.5)
Methemoglobin: 1.3 % (ref 0.0–1.5)
O2 Saturation: 74.7 %
Total hemoglobin: 7.7 g/dL — ABNORMAL LOW (ref 12.0–16.0)

## 2020-10-31 MED ORDER — DOCUSATE SODIUM 100 MG PO CAPS
200.0000 mg | ORAL_CAPSULE | Freq: Two times a day (BID) | ORAL | Status: DC
  Administered 2020-10-31 – 2020-11-07 (×7): 200 mg via ORAL
  Filled 2020-10-31 (×13): qty 2

## 2020-10-31 MED ORDER — LACTULOSE 10 GM/15ML PO SOLN
30.0000 g | Freq: Once | ORAL | Status: AC
  Administered 2020-10-31: 30 g via ORAL
  Filled 2020-10-31: qty 45

## 2020-10-31 MED ORDER — POLYETHYLENE GLYCOL 3350 17 G PO PACK
17.0000 g | PACK | Freq: Every day | ORAL | Status: DC
  Administered 2020-10-31 – 2020-11-07 (×7): 17 g via ORAL
  Filled 2020-10-31 (×7): qty 1

## 2020-10-31 MED ORDER — POTASSIUM CHLORIDE CRYS ER 20 MEQ PO TBCR
20.0000 meq | EXTENDED_RELEASE_TABLET | ORAL | Status: AC
  Administered 2020-10-31 (×3): 20 meq via ORAL
  Filled 2020-10-31 (×3): qty 1

## 2020-10-31 MED ORDER — MAGNESIUM SULFATE 2 GM/50ML IV SOLN
2.0000 g | Freq: Once | INTRAVENOUS | Status: AC
  Administered 2020-10-31: 2 g via INTRAVENOUS
  Filled 2020-10-31: qty 50

## 2020-10-31 MED ORDER — WARFARIN SODIUM 1 MG PO TABS: 1.0000 mg | ORAL_TABLET | Freq: Every day | ORAL | Status: DC

## 2020-10-31 NOTE — Progress Notes (Addendum)
Epicardial pacing wire removed per order at 1900. EPW intact upon removal. Protocol followed - patient will maintain on bedrest for 1hr and BP will be cycled q15 mins for 1hr. Patient educated about procedure and tolerated EPW removal well. No oozing at site after removal, clean dressing applied.  [employer] RN

## 2020-10-31 NOTE — Progress Notes (Signed)
Shift Summary: decreased milrinone per order, ambulation  N: AAOx4, walking around unit twice, up in chair majority of the day. Assist x1.   R: RA at rest, requires [facility] with and after activity. Diminished lungs, no cough.   CV: NSR 80s, BP WDL, epicardial wires taped to chest with orders to remove.   GI: cardiac diet, fair appetite, no BG checks. BMx2, large and loose.   GU: Foley in place, receiving lasix with adequate response.   No new skin issues, surgical incisions WDL. K+ and Mg+ replaced per order.   Alyxander Kollmann RN

## 2020-10-31 NOTE — Progress Notes (Signed)
Physical Therapy Treatment Patient Details Name: Joanna Hood MRN: [id number] DOB: [birth year] Today's Date: 10/31/2020    History of Present Illness pt is a 75 y/o female admitted 5/2 for evaluation of progressive dyspnea on exertion and increasing LE edema.  Work up for MVR vs replacement.  5/2 R and Left heart Cath with angiography,   5/5 multiple teeth extractions.  5/9  mitral valve replacement.  PMHx:  tricuspid regurg, RA, pulmonary HTN, ostioporosis, MVR, CHF, persistent Afib.    PT Comments    Pt progressing well toward goals.  Emphasis on reinforcing sternal precautions, pt actually not moving her shoulders during functional tasks enough, afraid she will overdo the precautions, transfer training/safety and progression of gait.    Follow Up Recommendations  Home health PT;Supervision/Assistance - 24 hour;Supervision - Intermittent     Equipment Recommendations  Rolling walker with 5" wheels    Recommendations for Other Services       Precautions / Restrictions Precautions Precautions: Fall;Sternal    Mobility  Bed Mobility               General bed mobility comments: OOB in the recliner on arrival    Transfers Overall transfer level: Needs assistance Equipment used: None Transfers: Sit to/from Stand Sit to Stand: Min assist         General transfer comment: pt builds momentum well with rocking, but needs more assist forward with minimal boost.  Ambulation/Gait Ambulation/Gait assistance: Min guard Gait Distance (Feet): 120 Feet Assistive device: IV Pole Gait Pattern/deviations: Step-through pattern   Gait velocity interpretation: <1.8 ft/sec, indicate of risk for recurrent falls General Gait Details: short, mildly unsteady steps with use of the IV pole.  At about 60 feet checked O2 sats and found to be in the low 80's %.  Pt asymptomatic and not confident in the reading.  On return, pt at 88% on RA, O2 reapplied.   Stairs              Wheelchair Mobility    Modified Rankin (Stroke Patients Only)       Balance Overall balance assessment: Needs assistance Sitting-balance support: No upper extremity supported;Feet supported;Feet unsupported Sitting balance-Leahy Scale: Good     Standing balance support: No upper extremity supported;Single extremity supported Standing balance-Leahy Scale: Fair Standing balance comment: prefers to hold a stationary surface, but able to balance.                            Cognition Arousal/Alertness: Awake/alert Behavior During Therapy: WFL for tasks assessed/performed Overall Cognitive Status: Within Functional Limits for tasks assessed                                        Exercises      General Comments General comments (skin integrity, edema, etc.): SpO2 decrease into the mid 80's on RA during activity.      Pertinent Vitals/Pain Pain Assessment: Faces Faces Pain Scale: Hurts a little bit Pain Location: sternal with movement Pain Descriptors / Indicators: Aching;Guarding Pain Intervention(s): Monitored during session    Home Living                      Prior Function            PT Goals (current goals can now be found in the care plan  section) Acute Rehab PT Goals Patient Stated Goal: home independent PT Goal Formulation: With patient Time For Goal Achievement: 11/06/20 Potential to Achieve Goals: Good Progress towards PT goals: Progressing toward goals    Frequency    Min 3X/week      PT Plan Current plan remains appropriate    Co-evaluation              AM-PAC PT "6 Clicks" Mobility   Outcome Measure  Help needed turning from your back to your side while in a flat bed without using bedrails?: A Little Help needed moving from lying on your back to sitting on the side of a flat bed without using bedrails?: A Little Help needed moving to and from a bed to a chair (including a wheelchair)?: A  Little Help needed standing up from a chair using your arms (e.g., wheelchair or bedside chair)?: A Little Help needed to walk in hospital room?: A Little Help needed climbing 3-5 steps with a railing? : A Lot 6 Click Score: 17    End of Session Equipment Utilized During Treatment: Oxygen Activity Tolerance: Patient tolerated treatment well Patient left: in chair;with call bell/phone within reach Nurse Communication: Mobility status PT Visit Diagnosis: Other abnormalities of gait and mobility (R26.89);Difficulty in walking, not elsewhere classified (R26.2);Pain     Time: 7353-2992 PT Time Calculation (min) (ACUTE ONLY): 23 min  Charges:  $Gait Training: 8-22 mins $Therapeutic Activity: 8-22 mins                     10/31/2020  Ginger Carne., PT Acute Rehabilitation Services [phone]  (pager) [phone]  (office)   Joanna Hood 10/31/2020, 12:53 PM

## 2020-10-31 NOTE — Progress Notes (Signed)
EVENING ROUNDS NOTE :     [address]       [place] [address]             [phone]                 3 Days Post-Op Procedure(s) (LRB): MITRAL VALVE REPLACEMENT (MVR) USING MEDTRONIC MOSAIC VALVE SIZE 29MM (N/A) CLIPPING OF ATRIAL APPENDAGE USING ATRICURE  CLIP SIZE 45MM (N/A) TRANSESOPHAGEAL ECHOCARDIOGRAM (TEE) (N/A)   Total Length of Stay:  LOS: 8 days  Events:   No events Resting comfortably    BP 130/65   Pulse 87   Temp 98.1 F (36.7 C) (Oral)   Resp (!) 0   Ht 5\' 4"  (1.626 m)   Wt 69.7 kg   SpO2 91%   BMI 26.38 kg/m   CVP:  [4 mmHg-13 mmHg] 4 mmHg     . sodium chloride    . lactated ringers Stopped (10/28/20 1515)  . lactated ringers Stopped (10/28/20 1515)  . milrinone 0.125 mcg/kg/min (10/31/20 1400)    I/O last 3 completed shifts: In: 76 [P.O.:250; I.V.:214.7; IV Piggyback:38.3] Out: 1545 [Urine:1265; Chest Tube:280]   CBC Latest Ref Rng & Units 10/31/2020 10/30/2020 10/29/2020  WBC 4.0 - 10.5 K/uL 19.2(H) 27.1(H) 31.3(H)  Hemoglobin 12.0 - 15.0 g/dL 7.5(L) 8.2(L) 9.3(L)  Hematocrit 36.0 - 46.0 % 22.4(L) 25.0(L) 28.6(L)  Platelets 150 - 400 K/uL 89(L) 88(L) 102(L)    BMP Latest Ref Rng & Units 10/31/2020 10/30/2020 10/29/2020  Glucose 70 - 99 mg/dL 110(H) 130(H) 156(H)  BUN 8 - 23 mg/dL 15 12 10   Creatinine 0.44 - 1.00 mg/dL 0.87 0.97 0.91  BUN/Creat Ratio 12 - 28 - - -  Sodium 135 - 145 mmol/L 132(L) 134(L) 135  Potassium 3.5 - 5.1 mmol/L 3.7 4.2 4.2  Chloride 98 - 111 mmol/L 101 105 106  CO2 22 - 32 mmol/L 24 24 23   Calcium 8.9 - 10.3 mg/dL 8.4(L) 8.6(L) 8.6(L)    ABG    Component Value Date/Time   PHART 7.357 10/28/2020 2334   PCO2ART 44.1 10/28/2020 2334   PO2ART 185 (H) 10/28/2020 2334   HCO3 24.8 10/28/2020 2334   TCO2 26 10/28/2020 2334   ACIDBASEDEF 1.0 10/28/2020 2334   O2SAT 74.7 10/31/2020 0415       Melodie Bouillon, MD 10/31/2020 3:25 PM

## 2020-10-31 NOTE — Progress Notes (Signed)
      [address]       [place] [address]             [phone]      3 Days Post-Op Procedure(s) (LRB): MITRAL VALVE REPLACEMENT (MVR) USING MEDTRONIC MOSAIC VALVE SIZE 29MM (N/A) CLIPPING OF ATRIAL APPENDAGE USING ATRICURE  CLIP SIZE 45MM (N/A) TRANSESOPHAGEAL ECHOCARDIOGRAM (TEE) (N/A)   Subjective:  Up in chair, doing pretty well.  Already ambulated around the ICU.  Objective: Vital signs in last 24 hours: Temp:  [97.5 F (36.4 C)-98.8 F (37.1 C)] 98.4 F (36.9 C) (05/12 0747) Pulse Rate:  [75-86] 76 (05/12 0600) Cardiac Rhythm: Normal sinus rhythm (05/12 0747) Resp:  [0-41] 22 (05/12 0600) BP: (99-137)/(46-74) 119/72 (05/12 0600) SpO2:  [89 %-100 %] 98 % (05/12 0600) Weight:  [69.7 kg] 69.7 kg (05/12 0500)  Hemodynamic parameters for last 24 hours: CVP:  [4 mmHg-13 mmHg] 4 mmHg  Intake/Output from previous day: 05/11 0701 - 05/12 0700 In: 371.1 [P.O.:250; I.V.:121.1] Out: 1030 [Urine:1010; Chest Tube:20]  General appearance: alert, cooperative and no distress Heart: regular rate and rhythm Lungs: clear to auscultation bilaterally Abdomen: soft, non-tender; bowel sounds normal; no masses,  no organomegaly Extremities: edema trace Wound: clean and dry  Lab Results: Recent Labs    10/30/20 0421 10/31/20 0415  WBC 27.1* 19.2*  HGB 8.2* 7.5*  HCT 25.0* 22.4*  PLT 88* 89*   BMET:  Recent Labs    10/30/20 0421 10/31/20 0415  NA 134* 132*  K 4.2 3.7  CL 105 101  CO2 24 24  GLUCOSE 130* 110*  BUN 12 15  CREATININE 0.97 0.87  CALCIUM 8.6* 8.4*    PT/INR:  Recent Labs    10/31/20 0415  LABPROT 26.6*  INR 2.5*   ABG    Component Value Date/Time   PHART 7.357 10/28/2020 2334   HCO3 24.8 10/28/2020 2334   TCO2 26 10/28/2020 2334   ACIDBASEDEF 1.0 10/28/2020 2334   O2SAT 74.7 10/31/2020 0415   CBG (last 3)  Recent Labs    10/30/20 0803 10/30/20 1135 10/30/20 1619  GLUCAP 156* 117* 120*    Assessment/Plan: S/P  Procedure(s) (LRB): MITRAL VALVE REPLACEMENT (MVR) USING MEDTRONIC MOSAIC VALVE SIZE 29MM (N/A) CLIPPING OF ATRIAL APPENDAGE USING ATRICURE  CLIP SIZE 45MM (N/A) TRANSESOPHAGEAL ECHOCARDIOGRAM (TEE) (N/A)  1. CV- rate controlled A. Fib- Co-ox 74, weaning Milrinone as able, AHF team following, will d/c EPW today 2. INR 2.5, will hold coumadin this evening, restart with 1 mg tomorrow 3. Pulm- no acute issues, CXR w/o pneumothorax, + basilar atelectasis on left 4. Renal-  Creatinine WNL, weight is trending down, IV lasix as ordered per AHF 5. Expected post operative blood loss anemia,- Hgb down to 7.5, no acute source of bleeding, may benefit from 1 unit of blood to help with hemodynamics, will discuss with staff 6. Expected thrombocytopenia- stable today at 89 K 7. Dispo- patient stable, weaning Milrinone as hemodynamics allow, currently at 0.25, may be able to decrease today, management per AHF, Hgb slowly dropping, now to 7.5, no acute source of bleed, diuretics for volume overload state, d/c EPW today, hold coumadin this evening   LOS: 8 days    Ellwood Handler, PA-C 10/31/2020

## 2020-10-31 NOTE — Progress Notes (Addendum)
Advanced Heart Failure Rounding Note  PCP-Cardiologist: Dr. Gwenlyn Found AHF: Dr. [name]   Patient Profile   [name], followed by Dr. Gwenlyn Found, w/ h/o chronic diastolic heart failure w/ predominant RV dysfunction + pulmonary hypertension, chronic atrial fibrillation on Eliquis, severe mitral regurgitation, systemic HTN and rheumatoid arthritis on Methotrexate, being evaluated for mitral valve repair vs replacement.   Subjective:    5/5 Underwent upper teeth extraction. Given Decadron.  5/6 RHC -> started on milrinone for elevated PA pressures and elevated PVR.  5/9 S/P bioprosthetic MVR Clipping atrial appendage.  5/10 Started on IV lasix. Milrinone cut back to 0.3.  5/11 Milrinone cut back to 0.25 mcg.   WBC 27K->19K    CO-OX 82%. CVP trending down to 13.   Denies SOB.  Objective:   Weight Range: 69.7 kg Body mass index is 26.38 kg/m.   Vital Signs:   Temp:  [97.5 F (36.4 C)-98.8 F (37.1 C)] 98.4 F (36.9 C) (05/12 0747) Pulse Rate:  [75-86] 76 (05/12 0600) Resp:  [0-41] 22 (05/12 0600) BP: (99-137)/(46-74) 119/72 (05/12 0600) SpO2:  [89 %-100 %] 98 % (05/12 0600) Weight:  [69.7 kg] 69.7 kg (05/12 0500) Last BM Date: 10/20/20  Weight change: Filed Weights   10/29/20 0413 10/30/20 0500 10/31/20 0500  Weight: 76.2 kg 73.6 kg 69.7 kg    Intake/Output:   Intake/Output Summary (Last 24 hours) at 10/31/2020 0846 Last data filed at 10/31/2020 0600 Gross per 24 hour  Intake 357.3 ml  Output 1010 ml  Net -652.7 ml      Physical Exam  CVP 9-10  General:  Sitting in the chair. No resp difficulty HEENT: normal Neck: supple. JVP 8-9. Carotids 2+ bilat; no bruits. No lymphadenopathy or thryomegaly appreciated. Cor: PMI nondisplaced. Regular rate & rhythm. No rubs, gallops or murmurs. Sternal incision.  Lungs: Crackles in the bases Abdomen: soft, nontender, nondistended. No hepatosplenomegaly. No bruits or masses. Good bowel sounds. Extremities: no cyanosis,  clubbing, rash, edema. RUE PICC  Neuro: alert & orientedx3, cranial nerves grossly intact. moves all 4 extremities w/o difficulty. Affect pleasant   Telemetry  NSR 80s  Labs    CBC Recent Labs    10/30/20 0421 10/31/20 0415  WBC 27.1* 19.2*  HGB 8.2* 7.5*  HCT 25.0* 22.4*  MCV 85.0 82.7  PLT 88* 89*   Basic Metabolic Panel Recent Labs    10/30/20 0421 10/31/20 0415  NA 134* 132*  K 4.2 3.7  CL 105 101  CO2 24 24  GLUCOSE 130* 110*  BUN 12 15  CREATININE 0.97 0.87  CALCIUM 8.6* 8.4*  MG 1.9 1.7   Liver Function Tests Recent Labs    10/31/20 0415  AST 30  ALT 9  ALKPHOS 56  BILITOT 1.0  PROT 5.0*  ALBUMIN 2.6*   No results for input(s): LIPASE, AMYLASE in the last 72 hours. Cardiac Enzymes No results for input(s): CKTOTAL, CKMB, CKMBINDEX, TROPONINI in the last 72 hours.  BNP: BNP (last 3 results) Recent Labs    10/23/20 0006 10/27/20 0155  BNP 490.4* 149.3*    ProBNP (last 3 results) No results for input(s): PROBNP in the last 8760 hours.   D-Dimer No results for input(s): DDIMER in the last 72 hours. Hemoglobin A1C No results for input(s): HGBA1C in the last 72 hours. Fasting Lipid Panel No results for input(s): CHOL, HDL, LDLCALC, TRIG, CHOLHDL, LDLDIRECT in the last 72 hours. Thyroid Function Tests No results for input(s): TSH, T4TOTAL, T3FREE, THYROIDAB  in the last 72 hours.  Invalid input(s): FREET3  Other results:   Imaging    DG Chest Port 1 View  Result Date: 10/31/2020 CLINICAL DATA:  Sore chest, postop EXAM: PORTABLE CHEST 1 VIEW COMPARISON:  10/30/2020 FINDINGS: Heart is borderline in size. Vascular congestion. Left base atelectasis or infiltrate, increasing since prior study. Minimal right base atelectasis. Possible small left effusion. No pneumothorax. IMPRESSION: Worsening aeration at the left base could reflect atelectasis or infiltrate. Vascular congestion. Electronically Signed   By: Rolm Baptise M.D.   On: 10/31/2020  08:23   DG Chest Port 1 View  Result Date: 10/30/2020 CLINICAL DATA:  Status post PICC line placement EXAM: PORTABLE CHEST 1 VIEW COMPARISON:  10/30/2020 FINDINGS: Right-sided PICC line with the tip projecting over the SVC. Left jugular central venous sheath in unchanged position. Interval removal of bilateral chest tubes. No pneumothorax. Bilateral mild interstitial thickening. Mild lingular atelectasis. Stable cardiomegaly. Thoracic aortic atherosclerosis. Mitral valve repair. No acute osseous abnormality. IMPRESSION: 1. Interval removal of bilateral chest tubes.  No pneumothorax. 2. Right-sided PICC line with the tip projecting over the SVC. 3. Cardiomegaly with pulmonary vascular congestion. Electronically Signed   By: Kathreen Devoid   On: 10/30/2020 13:29     Medications:     Scheduled Medications: . acetaminophen  1,000 mg Oral Q6H  . bisacodyl  10 mg Oral Daily   Or  . bisacodyl  10 mg Rectal Daily  . Chlorhexidine Gluconate Cloth  6 each Topical Daily  . docusate sodium  200 mg Oral Daily  . enoxaparin (LOVENOX) injection  30 mg Subcutaneous QHS  . feeding supplement  1 Container Oral TID BM  . ferrous [id number] C-folic acid  1 capsule Oral Q breakfast  . furosemide  40 mg Intravenous BID  . levothyroxine  125 mcg Oral Q0600  . mouth rinse  15 mL Mouth Rinse BID  . metoCLOPramide (REGLAN) injection  10 mg Intravenous Q6H  . pantoprazole  40 mg Oral Daily  . potassium chloride  20 mEq Oral Q4H  . sodium chloride flush  10-40 mL Intracatheter Q12H  . sodium chloride flush  3 mL Intravenous Q12H  . [START ON 11/01/2020] warfarin  1 mg Oral q1600  . Warfarin - Physician Dosing Inpatient   Does not apply q1600    Infusions: . sodium chloride    . DOPamine Stopped (10/30/20 0730)  . lactated ringers Stopped (10/28/20 1515)  . lactated ringers Stopped (10/28/20 1515)  . magnesium sulfate bolus IVPB    . milrinone 0.25 mcg/kg/min (10/31/20 0600)    PRN  Medications: morphine injection, ondansetron (ZOFRAN) IV, oxyCODONE, sodium chloride flush, sodium chloride flush, traMADol    Assessment/Plan   1. Severe Rheumatic Mitral Valve Regurgitation --> 10/28/20 S/P MVR - Primary MR. LVEF normal 60-65%  - TEE this admit confirmed severe MR. LHC w/ normal coronaries -S/P Bioprosthetic MVR 5/9 . - Started coumadin today per CT surgery.  - Planning for PICC today.  - CO-OX stable. Cut back milrinone to 0.125 mcg.  - CVP 9-10 . Continue IV lasix 40 mg BID.  - Renal function stable.   2. Severe Pulmonary Hypertension  - TTE 8/21 w/ severely elevated RVSP, 78 mmHg. 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 WHO Group 1 2/2 CTD (RA)  -  Repeat RHC on 5/6 with improved hemodynamics but PA pressures and PVR still elevated.  - Weaning milrinone.   3. Acute on Chronic Diastolic Heart Failure w/ Predominant RV Dysfunction  - 2D Echo 8/21 showed normal LVEF, 60-65%, w/ mild LVH and G3DD c/w restrictive physiology. RV was mildly reduced at the time - TEE 10/21/20 LVEF normal. RV systolic function is now moderately reduced w/ mild-mod TR, in setting of progressive/ now severe MR. - CVP 9-10 .  - Diuresing as above.   4. Chronic Afib: in setting of severe MR w/ severe LAE. -In SR today.  Started coumadin today per Dr Roxy Manns.  -  hgb 11 -> 9.4->8.8->8.2 ->7.5   - INR  2.5  today  -  No overt bleeding. Follow closely   5. Rheumatoid Arthritis - on MTX - followed by rheumatology   6. Hyponatremia - Sodium 134 today  7. ID  Had dose of decadron on 5/5 WBC trending down 27>19 T Max 98.8   Length of Stay: Onset, NP  10/31/2020, 8:46 AM  Advanced Heart Failure Team Pager [phone] (M-F; 7a - 5p)  Please contact [facility] for night-coverage after hours (5p -7a ) and  weekends on [email or web address]  Patient seen and examined with the above-signed Advanced Practice Provider and/or Housestaff. I personally reviewed laboratory data, imaging studies and relevant notes. I independently examined the patient and formulated the important aspects of the plan. I have edited the note to reflect any of my changes or salient points. I have personally discussed the plan with the patient and/or family.  Remains on milrinone 0.25. Diuresing well. Weight down 9 pounds. Creatinine stable. Co-ox 75%  General:  Sitting up in chair. Weak appearing  HEENT: normal Neck: supple. JVP 9 Carotids 2+ bilat; no bruits. No lymphadenopathy or thryomegaly appreciated. Cor: sternal wound ok  PMI nondisplaced. Regular rate & rhythm. No rubs, gallops or murmurs. Lungs: clear Abdomen: soft, nontender, nondistended. No hepatosplenomegaly. No bruits or masses. Good bowel sounds. Extremities: no cyanosis, clubbing, rash, tr edema Neuro: alert & orientedx3, cranial nerves grossly intact. moves all 4 extremities w/o difficulty. Affect pleasant  Progressing slowly. Co-ox stable. Can decrease milrinone to 0.125. Continue IV lasix. Mobilize. Supp mag.   Glori Bickers, MD  9:15 AM

## 2020-11-01 DIAGNOSIS — I5033 Acute on chronic diastolic (congestive) heart failure: Secondary | ICD-10-CM | POA: Diagnosis not present

## 2020-11-01 DIAGNOSIS — Z953 Presence of xenogenic heart valve: Secondary | ICD-10-CM | POA: Diagnosis not present

## 2020-11-01 DIAGNOSIS — I051 Rheumatic mitral insufficiency: Secondary | ICD-10-CM | POA: Diagnosis not present

## 2020-11-01 DIAGNOSIS — I272 Pulmonary hypertension, unspecified: Secondary | ICD-10-CM | POA: Diagnosis not present

## 2020-11-01 LAB — CBC
HCT: 22.2 % — ABNORMAL LOW (ref 36.0–46.0)
Hemoglobin: 7.6 g/dL — ABNORMAL LOW (ref 12.0–15.0)
MCH: 28.4 pg (ref 26.0–34.0)
MCHC: 34.2 g/dL (ref 30.0–36.0)
MCV: 82.8 fL (ref 80.0–100.0)
Platelets: 113 10*3/uL — ABNORMAL LOW (ref 150–400)
RBC: 2.68 MIL/uL — ABNORMAL LOW (ref 3.87–5.11)
RDW: 15.9 % — ABNORMAL HIGH (ref 11.5–15.5)
WBC: 13.2 10*3/uL — ABNORMAL HIGH (ref 4.0–10.5)
nRBC: 0 % (ref 0.0–0.2)

## 2020-11-01 LAB — BASIC METABOLIC PANEL
Anion gap: 3 — ABNORMAL LOW (ref 5–15)
BUN: 9 mg/dL (ref 8–23)
CO2: 26 mmol/L (ref 22–32)
Calcium: 8.2 mg/dL — ABNORMAL LOW (ref 8.9–10.3)
Chloride: 104 mmol/L (ref 98–111)
Creatinine, Ser: 0.65 mg/dL (ref 0.44–1.00)
GFR, Estimated: >60 mL/min (ref >60)
Glucose, Bld: 90 mg/dL (ref 70–99)
Potassium: 3.8 mmol/L (ref 3.5–5.1)
Sodium: 133 mmol/L — ABNORMAL LOW (ref 135–145)

## 2020-11-01 LAB — PROTIME-INR
INR: 2 — ABNORMAL HIGH (ref 0.8–1.2)
Prothrombin Time: 22.4 seconds — ABNORMAL HIGH (ref 11.4–15.2)

## 2020-11-01 LAB — COOXEMETRY PANEL
Carboxyhemoglobin: 1.5 % (ref 0.5–1.5)
Methemoglobin: 0.9 % (ref 0.0–1.5)
O2 Saturation: 55.4 %
Total hemoglobin: 7.7 g/dL — ABNORMAL LOW (ref 12.0–16.0)

## 2020-11-01 LAB — MAGNESIUM: Magnesium: 1.5 mg/dL — ABNORMAL LOW (ref 1.7–2.4)

## 2020-11-01 MED ORDER — POTASSIUM CHLORIDE CRYS ER 20 MEQ PO TBCR
40.0000 meq | EXTENDED_RELEASE_TABLET | Freq: Once | ORAL | Status: AC
  Administered 2020-11-01: 40 meq via ORAL
  Filled 2020-11-01: qty 2

## 2020-11-01 MED ORDER — MAGNESIUM SULFATE 4 GM/100ML IV SOLN
4.0000 g | Freq: Once | INTRAVENOUS | Status: AC
  Administered 2020-11-01: 4 g via INTRAVENOUS
  Filled 2020-11-01: qty 100

## 2020-11-01 MED ORDER — SPIRONOLACTONE 12.5 MG HALF TABLET
12.5000 mg | ORAL_TABLET | Freq: Every day | ORAL | Status: DC
  Administered 2020-11-01 – 2020-11-03 (×3): 12.5 mg via ORAL
  Filled 2020-11-01 (×3): qty 1

## 2020-11-01 MED ORDER — WARFARIN SODIUM 1 MG PO TABS
1.0000 mg | ORAL_TABLET | Freq: Every day | ORAL | Status: DC
  Administered 2020-11-01: 1 mg via ORAL
  Filled 2020-11-01 (×2): qty 1

## 2020-11-01 NOTE — Progress Notes (Signed)
      [address]       [place] [address]             [phone]      4 Days Post-Op Procedure(s) (LRB): MITRAL VALVE REPLACEMENT (MVR) USING MEDTRONIC MOSAIC VALVE SIZE 29MM (N/A) CLIPPING OF ATRIAL APPENDAGE USING ATRICURE  CLIP SIZE 45MM (N/A) TRANSESOPHAGEAL ECHOCARDIOGRAM (TEE) (N/A)   Subjective:  Up in chair, no complaints.  + ambulation  + BM  Objective: Vital signs in last 24 hours: Temp:  [97.7 F (36.5 C)-98.7 F (37.1 C)] 98.7 F (37.1 C) (05/13 0800) Pulse Rate:  [72-90] 78 (05/13 0800) Cardiac Rhythm: Normal sinus rhythm (05/13 0400) Resp:  [0-28] 23 (05/13 0800) BP: (113-164)/(52-105) 159/75 (05/13 0800) SpO2:  [87 %-100 %] 98 % (05/13 0800) Weight:  [70.9 kg] 70.9 kg (05/13 0643)  Hemodynamic parameters for last 24 hours: CVP:  [2 mmHg-11 mmHg] 7 mmHg  Intake/Output from previous day: 05/12 0701 - 05/13 0700 In: 117.5 [I.V.:65.2; IV Piggyback:52.3] Out: 2635 [Urine:2635] Intake/Output this shift: Total I/O In: 2.5 [I.V.:2.5] Out: 40 [Urine:40]  General appearance: alert, cooperative and no distress Heart: RRR Lungs: clear to auscultation bilaterally Abdomen: soft, non-tender; bowel sounds normal; no masses,  no organomegaly Extremities: edema trace Wound: clean and dry  Lab Results: Recent Labs    10/31/20 0415 11/01/20 0439  WBC 19.2* 13.2*  HGB 7.5* 7.6*  HCT 22.4* 22.2*  PLT 89* 113*   BMET:  Recent Labs    10/31/20 0415 11/01/20 0439  NA 132* 133*  K 3.7 3.8  CL 101 104  CO2 24 26  GLUCOSE 110* 90  BUN 15 9  CREATININE 0.87 0.65  CALCIUM 8.4* 8.2*    PT/INR:  Recent Labs    11/01/20 0439  LABPROT 22.4*  INR 2.0*   ABG    Component Value Date/Time   PHART 7.357 10/28/2020 2334   HCO3 24.8 10/28/2020 2334   TCO2 26 10/28/2020 2334   ACIDBASEDEF 1.0 10/28/2020 2334   O2SAT 55.4 11/01/2020 0439   CBG (last 3)  Recent Labs    10/30/20 0803 10/30/20 1135 10/30/20 1619  GLUCAP 156* 117*  120*    Assessment/Plan: S/P Procedure(s) (LRB): MITRAL VALVE REPLACEMENT (MVR) USING MEDTRONIC MOSAIC VALVE SIZE 29MM (N/A) CLIPPING OF ATRIAL APPENDAGE USING ATRICURE  CLIP SIZE 45MM (N/A) TRANSESOPHAGEAL ECHOCARDIOGRAM (TEE) (N/A)  1. CV- NSR, Co-ox 55.4- Milrinone at 0.125, weaning per AHF 2. Pulm- no acute issues, off oxygen, continue IS 3. INR 2.0, will resume coumadin at 1 mg daily 4. Renal- creatinine WNL, receiving IV lasix, K is at 3.8, continue to supplement, okay to remove foley, unless needed for strict I/O 5. Expected post operative blood loss anemia, Hgb 7.6, continue iron supplementation,  6. Expected post operative thrombocytopenia, improved up to 113 7. Dispo- patient stable, in NSR, weaning Milrinone per AHF has hemodynamics allow, currently on 0.125, Coox 54 this morning, resume coumadin for MV, continue diuretics, supplement K, okay to remove foley from our standpoint, unless needed for strict I/O monitoring, continue ambulation, patient will be ready for transfer out of ICU once Milrinone is off   LOS: 9 days    Ellwood Handler, PA-C 11/01/2020

## 2020-11-01 NOTE — Progress Notes (Addendum)
Advanced Heart Failure Rounding Note  PCP-Cardiologist: Dr. Gwenlyn Found AHF: Dr. [name]   Patient Profile   [name], followed by Dr. Gwenlyn Found, w/ h/o chronic diastolic heart failure w/ predominant RV dysfunction + pulmonary hypertension, chronic atrial fibrillation on Eliquis, severe mitral regurgitation, systemic HTN and rheumatoid arthritis on Methotrexate, being evaluated for mitral valve repair vs replacement.   Subjective:    5/5 Underwent upper teeth extraction. Given Decadron.  5/6 RHC -> started on milrinone for elevated PA pressures and elevated PVR.  5/9 S/P bioprosthetic MVR Clipping atrial appendage.  5/10 Started on IV lasix. Milrinone cut back to 0.3.  5/11 Milrinone cut back to 0.25 mcg.  5/12 Milrinone reduced to 0.125 mcg   Co-ox lower today w/ milrinone wean. 75>>55%.   - 2.6L in UOP yesterday w/ IV Lasix but still ~10 lb above pre-op wt. CVP 7. Scr stable 0.65, SBPs 150s   WBC 27K->19->13K. AF   Hgb 7.6   OOB sitting up in chair. Ambulated earlier today and did ok. No complaints.    Objective:   Weight Range: 70.9 kg Body mass index is 26.85 kg/m.   Vital Signs:   Temp:  [97.7 F (36.5 C)-98.7 F (37.1 C)] 98.7 F (37.1 C) (05/13 0800) Pulse Rate:  [72-90] 78 (05/13 0800) Resp:  [0-28] 23 (05/13 0800) BP: (113-164)/(52-105) 159/75 (05/13 0800) SpO2:  [87 %-100 %] 98 % (05/13 0800) Weight:  [70.9 kg] 70.9 kg (05/13 0643) Last BM Date: 10/31/20  Weight change: Filed Weights   10/30/20 0500 10/31/20 0500 11/01/20 0643  Weight: 73.6 kg 69.7 kg 70.9 kg    Intake/Output:   Intake/Output Summary (Last 24 hours) at 11/01/2020 0841 Last data filed at 11/01/2020 0800 Gross per 24 hour  Intake 114.99 ml  Output 2425 ml  Net -2310.01 ml      Physical Exam   CVP 7  General:  Well appearing, sitting up in chair. No respiratory difficulty HEENT: normal Neck: supple. JVD 7 cm. Carotids 2+ bilat; no bruits. No lymphadenopathy or thyromegaly  appreciated. Cor: PMI nondisplaced. Regular rate & rhythm. No rubs, gallops or murmurs. + sternotomy site ok.  Lungs: clear Abdomen: soft, nontender, nondistended. No hepatosplenomegaly. No bruits or masses. Good bowel sounds. Extremities: no cyanosis, clubbing, rash, edema Neuro: alert & oriented x 3, cranial nerves grossly intact. moves all 4 extremities w/o difficulty. Affect pleasant.  Telemetry   NSR 80s  Labs    CBC Recent Labs    10/31/20 0415 11/01/20 0439  WBC 19.2* 13.2*  HGB 7.5* 7.6*  HCT 22.4* 22.2*  MCV 82.7 82.8  PLT 89* 017*   Basic Metabolic Panel Recent Labs    10/30/20 0421 10/31/20 0415 11/01/20 0439  NA 134* 132* 133*  K 4.2 3.7 3.8  CL 105 101 104  CO2 24 24 26   GLUCOSE 130* 110* 90  BUN 12 15 9   CREATININE 0.97 0.87 0.65  CALCIUM 8.6* 8.4* 8.2*  MG 1.9 1.7  --    Liver Function Tests Recent Labs    10/31/20 0415  AST 30  ALT 9  ALKPHOS 56  BILITOT 1.0  PROT 5.0*  ALBUMIN 2.6*   No results for input(s): LIPASE, AMYLASE in the last 72 hours. Cardiac Enzymes No results for input(s): CKTOTAL, CKMB, CKMBINDEX, TROPONINI in the last 72 hours.  BNP: BNP (last 3 results) Recent Labs    10/23/20 0006 10/27/20 0155  BNP 490.4* 149.3*    ProBNP (last 3 results) No  results for input(s): PROBNP in the last 8760 hours.   D-Dimer No results for input(s): DDIMER in the last 72 hours. Hemoglobin A1C No results for input(s): HGBA1C in the last 72 hours. Fasting Lipid Panel No results for input(s): CHOL, HDL, LDLCALC, TRIG, CHOLHDL, LDLDIRECT in the last 72 hours. Thyroid Function Tests No results for input(s): TSH, T4TOTAL, T3FREE, THYROIDAB in the last 72 hours.  Invalid input(s): FREET3  Other results:   Imaging    No results found.   Medications:     Scheduled Medications: . acetaminophen  1,000 mg Oral Q6H  . bisacodyl  10 mg Oral Daily   Or  . bisacodyl  10 mg Rectal Daily  . Chlorhexidine Gluconate Cloth  6 each  Topical Daily  . docusate sodium  200 mg Oral BID  . feeding supplement  1 Container Oral TID BM  . ferrous [id number] C-folic acid  1 capsule Oral Q breakfast  . furosemide  40 mg Intravenous BID  . levothyroxine  125 mcg Oral Q0600  . mouth rinse  15 mL Mouth Rinse BID  . metoCLOPramide (REGLAN) injection  10 mg Intravenous Q6H  . pantoprazole  40 mg Oral Daily  . polyethylene glycol  17 g Oral Daily  . sodium chloride flush  10-40 mL Intracatheter Q12H  . sodium chloride flush  3 mL Intravenous Q12H  . warfarin  1 mg Oral q1600  . Warfarin - Physician Dosing Inpatient   Does not apply q1600    Infusions: . sodium chloride    . lactated ringers Stopped (10/28/20 1515)  . lactated ringers Stopped (10/28/20 1515)  . milrinone 0.125 mcg/kg/min (11/01/20 0800)    PRN Medications: morphine injection, ondansetron (ZOFRAN) IV, oxyCODONE, sodium chloride flush, sodium chloride flush, traMADol    Assessment/Plan   1. Severe Rheumatic Mitral Valve Regurgitation --> 10/28/20 S/P MVR - Primary MR. LVEF normal 60-65%  - TEE this admit confirmed severe MR. LHC w/ normal coronaries - S/P Bioprosthetic MVR 5/9  - on Coumadin. INR 2.0  - CO-OX marginal at 55%. Continue milrinone at 0.125 mcg.  - CVP 7. Wt still up 10 lb above pre-op. Continue IV lasix 40 mg BID.  - Renal function stable.   2. Severe Pulmonary Hypertension  - TTE 8/21 w/ severely elevated RVSP, 78 mmHg. 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 WHO Group 1 2/2 CTD (RA)  - Repeat RHC on 5/6 with improved hemodynamics but PA pressures and PVR still elevated.  - Continue milrinone 0.125 today   3. Acute on Chronic Diastolic Heart Failure w/ Predominant RV Dysfunction  - 2D Echo 8/21 showed normal LVEF, 60-65%, w/ mild LVH and G3DD c/w  restrictive physiology. RV was mildly reduced at the time - TEE 10/21/20 LVEF normal. RV systolic function is now moderately reduced w/ mild-mod TR, in setting of progressive/ now severe MR. - CVP 7 but still up 10 lb above pre-op - Continue IV Lasix 40 mg bid - Add Spiro 12.5 mg daily  - Consider Jardiance next (Hgb A1c 5.0).   4. Chronic Afib: in setting of severe MR w/ severe LAE. - HR controlled.  Started coumadin per Dr Roxy Manns.  - hgb 11 -> 9.4->8.8->8.2 ->7.5->7.6 - INR  2.0  today  - No overt bleeding. Follow closely  5. Rheumatoid Arthritis - on MTX - followed by rheumatology   6. Hyponatremia - Sodium 133 today - c/w diuresis   7. Leukocytosis  -Had dose of decadron on 5/5 - WBC trending down 27>19>13K. AF  - CXR also w/ atelectasis. Encouraged use of IS   8. Hypomagnesemia - Mg 1.7 yesterday, IV Mg given  - f/u Mg level today  Length of Stay: [address], PA-C  11/01/2020, 8:41 AM  Advanced Heart Failure Team Pager [phone] (M-F; 7a - 5p)  Please contact [facility] for night-coverage after hours (5p -7a ) and weekends on [email or web address]  Patient seen and examined with the above-signed Advanced Practice Provider and/or Housestaff. I personally reviewed laboratory data, imaging studies and relevant notes. I independently examined the patient and formulated the important aspects of the plan. I have edited the note to reflect any of my changes or salient points. I have personally discussed the plan with the patient and/or family.  Continues to progress. Co-ox marginal on milrinone 0.125. Volume status still up. Hgb stable. Ambulating. Rhythm stable.   General:  Sitting in chair . No resp difficulty HEENT: normal Neck: supple. no JVD. Carotids 2+ bilat; no bruits. No lymphadenopathy or thryomegaly appreciated. Cor: + sternal wound ok  Regular rate & rhythm. No rubs, gallops or murmurs. Lungs: clear decreased R base Abdomen: soft, nontender, nondistended. No  hepatosplenomegaly. No bruits or masses. Good bowel sounds. Extremities: no cyanosis, clubbing, rash, tr edema Neuro: alert & orientedx3, cranial nerves grossly intact. moves all 4 extremities w/o difficulty. Affect pleasant  Improving slowly. Continue milrinone today. Continue IV lasix. Agree with spiro. Continue to mobilize. INR 2.0.   Glori Bickers, MD  9:28 AM

## 2020-11-01 NOTE — Progress Notes (Signed)
Physical Therapy Treatment Patient Details Name: Joanna Hood MRN: [id number] DOB: Feb 08, 1946 Today's Date: 11/01/2020    History of Present Illness pt is a 75 y/o female admitted 5/2 for evaluation of progressive dyspnea on exertion and increasing LE edema.  Work up for MVR vs replacement.  5/2 R and Left heart Cath with angiography,   5/5 multiple teeth extractions.  5/9  mitral valve replacement.  PMHx:  tricuspid regurg, RA, pulmonary HTN, ostioporosis, MVR, CHF, persistent Afib.    PT Comments    Pt very pleasant in chair on arrival and able to walk to bathroom, in hall, down stairs and complete HEP. Pt educated for all precautions with handout provided and education for progressive walking program. Pt has assist of family at D/C and is progressing well with encouragement to continue mobility with staff.   HR 86-94 SpO2 93-97% on RA BP pre gait148/62, post 182/64    Follow Up Recommendations  Supervision - Intermittent;No PT follow up     Equipment Recommendations  None recommended by PT    Recommendations for Other Services       Precautions / Restrictions Precautions Precautions: Fall;Sternal Precaution Booklet Issued: Yes (comment) Restrictions Weight Bearing Restrictions: Yes (Sternal)    Mobility  Bed Mobility   Bed Mobility: Sit to Supine       Sit to supine: Min assist   General bed mobility comments: in chair on arrival. min assist to return to bed with assist to lift legs to surface    Transfers Overall transfer level: Needs assistance   Transfers: Sit to/from Stand Sit to Stand: Supervision         General transfer comment: supervision for lines  Ambulation/Gait Ambulation/Gait assistance: Supervision Gait Distance (Feet): 220 Feet Assistive device: None Gait Pattern/deviations: Step-through pattern;Decreased stride length   Gait velocity interpretation: 1.31 - 2.62 ft/sec, indicative of limited community ambulator General Gait  Details: slow steady gait without UE support   Stairs Stairs: Yes Stairs assistance: Supervision Stair Management: Step to pattern;Forwards;One rail Right Number of Stairs: 3 General stair comments: pt completed stairs with use of rail without additional physical assist, supervision for line management   Wheelchair Mobility    Modified Rankin (Stroke Patients Only)       Balance Overall balance assessment: Mild deficits observed, not formally tested                                          Cognition Arousal/Alertness: Awake/alert Behavior During Therapy: WFL for tasks assessed/performed Overall Cognitive Status: Within Functional Limits for tasks assessed                                        Exercises General Exercises - Lower Extremity Long Arc Quad: AROM;Both;Seated;15 reps Hip Flexion/Marching: AROM;Both;Seated;15 reps    General Comments        Pertinent Vitals/Pain Pain Assessment: No/denies pain    Home Living                      Prior Function            PT Goals (current goals can now be found in the care plan section) Progress towards PT goals: Progressing toward goals    Frequency    Min 3X/week  PT Plan Discharge plan needs to be updated;Equipment recommendations need to be updated    Co-evaluation              AM-PAC PT "6 Clicks" Mobility   Outcome Measure  Help needed turning from your back to your side while in a flat bed without using bedrails?: A Little Help needed moving from lying on your back to sitting on the side of a flat bed without using bedrails?: A Little Help needed moving to and from a bed to a chair (including a wheelchair)?: A Little Help needed standing up from a chair using your arms (e.g., wheelchair or bedside chair)?: A Little Help needed to walk in hospital room?: A Little Help needed climbing 3-5 steps with a railing? : A Little 6 Click Score: 18     End of Session   Activity Tolerance: Patient tolerated treatment well Patient left: in bed;with call bell/phone within reach;with nursing/sitter in room Nurse Communication: Mobility status PT Visit Diagnosis: Other abnormalities of gait and mobility (R26.89)     Time: 1000-1032 PT Time Calculation (min) (ACUTE ONLY): 32 min  Charges:  $Gait Training: 8-22 mins $Therapeutic Activity: 8-22 mins                     Taimane Stimmel P, PT Acute Rehabilitation Services Pager: [phone] Office: [place] 11/01/2020, 10:36 AM

## 2020-11-01 NOTE — Progress Notes (Signed)
TCTS Evening Rounds  POD #4 s/p MVr, maze Diuresing Slow milrinone wean  PE: BP (!) 150/57   Pulse 77   Temp 98.8 F (37.1 C) (Axillary)   Resp (!) 21   Ht 5\' 4"  (1.626 m)   Wt 70.9 kg   SpO2 93%   BMI 26.85 kg/m  Alert/sitting in chair CTA RRR Incisions c/d/i  Labs reviewed Hct low-- on iron  A/p:  Continue present management Tacey Dimaggio Z. Orvan Seen, [place]

## 2020-11-02 ENCOUNTER — Inpatient Hospital Stay (HOSPITAL_COMMUNITY): Payer: Medicare HMO

## 2020-11-02 DIAGNOSIS — I051 Rheumatic mitral insufficiency: Secondary | ICD-10-CM | POA: Diagnosis not present

## 2020-11-02 DIAGNOSIS — I272 Pulmonary hypertension, unspecified: Secondary | ICD-10-CM | POA: Diagnosis not present

## 2020-11-02 DIAGNOSIS — I5033 Acute on chronic diastolic (congestive) heart failure: Secondary | ICD-10-CM | POA: Diagnosis not present

## 2020-11-02 DIAGNOSIS — I4821 Permanent atrial fibrillation: Secondary | ICD-10-CM | POA: Diagnosis not present

## 2020-11-02 LAB — BASIC METABOLIC PANEL
Anion gap: 8 (ref 5–15)
BUN: 7 mg/dL — ABNORMAL LOW (ref 8–23)
CO2: 25 mmol/L (ref 22–32)
Calcium: 8.1 mg/dL — ABNORMAL LOW (ref 8.9–10.3)
Chloride: 101 mmol/L (ref 98–111)
Creatinine, Ser: 0.73 mg/dL (ref 0.44–1.00)
GFR, Estimated: >60 mL/min (ref >60)
Glucose, Bld: 90 mg/dL (ref 70–99)
Potassium: 3.6 mmol/L (ref 3.5–5.1)
Sodium: 134 mmol/L — ABNORMAL LOW (ref 135–145)

## 2020-11-02 LAB — COOXEMETRY PANEL
Carboxyhemoglobin: 1.8 % — ABNORMAL HIGH (ref 0.5–1.5)
Carboxyhemoglobin: 1.7 % — ABNORMAL HIGH (ref 0.5–1.5)
Methemoglobin: 1 % (ref 0.0–1.5)
Methemoglobin: 0.8 % (ref 0.0–1.5)
O2 Saturation: 48.4 %
O2 Saturation: 46.6 %
Total hemoglobin: 8 g/dL — ABNORMAL LOW (ref 12.0–16.0)
Total hemoglobin: 9.1 g/dL — ABNORMAL LOW (ref 12.0–16.0)

## 2020-11-02 LAB — CBC
HCT: 23.5 % — ABNORMAL LOW (ref 36.0–46.0)
Hemoglobin: 8 g/dL — ABNORMAL LOW (ref 12.0–15.0)
MCH: 28.1 pg (ref 26.0–34.0)
MCHC: 34 g/dL (ref 30.0–36.0)
MCV: 82.5 fL (ref 80.0–100.0)
Platelets: 149 10*3/uL — ABNORMAL LOW (ref 150–400)
RBC: 2.85 MIL/uL — ABNORMAL LOW (ref 3.87–5.11)
RDW: 16.1 % — ABNORMAL HIGH (ref 11.5–15.5)
WBC: 14.2 10*3/uL — ABNORMAL HIGH (ref 4.0–10.5)
nRBC: 0 % (ref 0.0–0.2)

## 2020-11-02 LAB — MAGNESIUM: Magnesium: 2.1 mg/dL (ref 1.7–2.4)

## 2020-11-02 LAB — PROTIME-INR
INR: 1.6 — ABNORMAL HIGH (ref 0.8–1.2)
Prothrombin Time: 19 seconds — ABNORMAL HIGH (ref 11.4–15.2)

## 2020-11-02 MED ORDER — POTASSIUM CHLORIDE 10 MEQ/50ML IV SOLN
10.0000 meq | INTRAVENOUS | Status: AC
Start: 2020-11-02 — End: 2020-11-02
  Administered 2020-11-02 (×3): 10 meq via INTRAVENOUS
  Filled 2020-11-02 (×3): qty 50

## 2020-11-02 MED ORDER — SACUBITRIL-VALSARTAN 24-26 MG PO TABS
1.0000 | ORAL_TABLET | Freq: Two times a day (BID) | ORAL | Status: DC
  Administered 2020-11-02 – 2020-11-03 (×4): 1 via ORAL
  Filled 2020-11-02 (×5): qty 1

## 2020-11-02 MED ORDER — WARFARIN SODIUM 2 MG PO TABS
2.0000 mg | ORAL_TABLET | Freq: Every day | ORAL | Status: DC
  Administered 2020-11-02 – 2020-11-04 (×3): 2 mg via ORAL
  Filled 2020-11-02 (×4): qty 1

## 2020-11-02 MED ORDER — POTASSIUM CHLORIDE CRYS ER 20 MEQ PO TBCR
40.0000 meq | EXTENDED_RELEASE_TABLET | Freq: Once | ORAL | Status: AC
  Administered 2020-11-02: 40 meq via ORAL
  Filled 2020-11-02: qty 2

## 2020-11-02 NOTE — Plan of Care (Signed)
  Problem: Education: Goal: Knowledge of General Education information will improve Description: Including pain rating scale, medication(s)/side effects and non-pharmacologic comfort measures Outcome: Progressing   Problem: Health Behavior/Discharge Planning: Goal: Ability to manage health-related needs will improve Outcome: Progressing   Problem: Clinical Measurements: Goal: Ability to maintain clinical measurements within normal limits will improve Outcome: Progressing Goal: Diagnostic test results will improve Outcome: Progressing Goal: Cardiovascular complication will be avoided Outcome: Progressing   Problem: Elimination: Goal: Will not experience complications related to bowel motility Outcome: Progressing   Problem: Education: Goal: Ability to demonstrate management of disease process will improve Outcome: Progressing Goal: Ability to verbalize understanding of medication therapies will improve Outcome: Progressing Goal: Individualized Educational Video(s) Outcome: Progressing   Problem: Activity: Goal: Capacity to carry out activities will improve Outcome: Progressing   Problem: Cardiac: Goal: Ability to achieve and maintain adequate cardiopulmonary perfusion will improve Outcome: Progressing   Problem: Education: Goal: Will demonstrate proper wound care and an understanding of methods to prevent future damage Outcome: Progressing Goal: Knowledge of disease or condition will improve Outcome: Progressing Goal: Knowledge of the prescribed therapeutic regimen will improve Outcome: Progressing Goal: Individualized Educational Video(s) Outcome: Progressing   Problem: Activity: Goal: Risk for activity intolerance will decrease Outcome: Progressing   Problem: Cardiac: Goal: Will achieve and/or maintain hemodynamic stability Outcome: Progressing   Problem: Clinical Measurements: Goal: Postoperative complications will be avoided or minimized Outcome:  Progressing   Problem: Respiratory: Goal: Respiratory status will improve Outcome: Progressing   Problem: Skin Integrity: Goal: Wound healing without signs and symptoms of infection Outcome: Progressing Goal: Risk for impaired skin integrity will decrease Outcome: Progressing   Problem: Urinary Elimination: Goal: Ability to achieve and maintain adequate renal perfusion and functioning will improve Outcome: Progressing   

## 2020-11-02 NOTE — Significant Event (Signed)
Patient transferred safely to 2C01, ambulated there with RN. VS stable during the transfer. All belongings taken to new room (notepad; eye-glasses). Patient settled in chair; staff in room prior to RN leaving. Patient's daughter [name] aware of the transfer and the new room number. Report given to receiving RN.    Kathaleen Grinder, RN

## 2020-11-02 NOTE — Progress Notes (Signed)
5 Days Post-Op Procedure(s) (LRB): MITRAL VALVE REPLACEMENT (MVR) USING MEDTRONIC MOSAIC VALVE SIZE 29MM (N/A) CLIPPING OF ATRIAL APPENDAGE USING ATRICURE  CLIP SIZE 45MM (N/A) TRANSESOPHAGEAL ECHOCARDIOGRAM (TEE) (N/A) Subjective: No complaints  Objective: Vital signs in last 24 hours: Temp:  [98.3 F (36.8 C)-98.8 F (37.1 C)] 98.3 F (36.8 C) (05/14 0807) Pulse Rate:  [66-88] 76 (05/14 0900) Cardiac Rhythm: Normal sinus rhythm (05/14 0815) Resp:  [12-31] 18 (05/14 0900) BP: (112-182)/(49-148) 158/78 (05/14 0900) SpO2:  [88 %-96 %] 96 % (05/14 0900) Weight:  [68.7 kg] 68.7 kg (05/14 0600)  Hemodynamic parameters for last 24 hours: CVP:  [6 mmHg-11 mmHg] 6 mmHg  Intake/Output from previous day: 05/13 0701 - 05/14 0700 In: 540.6 [P.O.:450; I.V.:60.5; IV Piggyback:30.1] Out: 2865 [Urine:2865] Intake/Output this shift: Total I/O In: 197 [P.O.:120; I.V.:2.5; IV Piggyback:74.5] Out: -   General appearance: alert and cooperative Neurologic: intact Heart: regular rate and rhythm, S1, S2 normal, no murmur, click, rub or gallop Lungs: clear to auscultation bilaterally Abdomen: soft, non-tender; bowel sounds normal; no masses,  no organomegaly Extremities: extremities normal, atraumatic, no cyanosis or edema Wound: c/d/i  Lab Results: Recent Labs    11/01/20 0439 11/02/20 0451  WBC 13.2* 14.2*  HGB 7.6* 8.0*  HCT 22.2* 23.5*  PLT 113* 149*   BMET:  Recent Labs    11/01/20 0439 11/02/20 0435  NA 133* 134*  K 3.8 3.6  CL 104 101  CO2 26 25  GLUCOSE 90 90  BUN 9 7*  CREATININE 0.65 0.73  CALCIUM 8.2* 8.1*    PT/INR:  Recent Labs    11/02/20 0435  LABPROT 19.0*  INR 1.6*   ABG    Component Value Date/Time   PHART 7.357 10/28/2020 2334   HCO3 24.8 10/28/2020 2334   TCO2 26 10/28/2020 2334   ACIDBASEDEF 1.0 10/28/2020 2334   O2SAT 46.6 11/02/2020 0925   CBG (last 3)  Recent Labs    10/30/20 1135 10/30/20 1619  GLUCAP 117* 120*     Assessment/Plan: S/P Procedure(s) (LRB): MITRAL VALVE REPLACEMENT (MVR) USING MEDTRONIC MOSAIC VALVE SIZE 29MM (N/A) CLIPPING OF ATRIAL APPENDAGE USING ATRICURE  CLIP SIZE 45MM (N/A) TRANSESOPHAGEAL ECHOCARDIOGRAM (TEE) (N/A) Mobilize Diuresis Plan for transfer to step-down: see transfer orders  Anti-hypertensive regimen per AHF   LOS: 10 days    Wonda Olds 11/02/2020

## 2020-11-02 NOTE — Progress Notes (Addendum)
Advanced Heart Failure Rounding Note  PCP-Cardiologist: Dr. Gwenlyn Found AHF: Dr. [name]   Patient Profile   [name], followed by Dr. Gwenlyn Found, w/ h/o chronic diastolic heart failure w/ predominant RV dysfunction + pulmonary hypertension, chronic atrial fibrillation on Eliquis, severe mitral regurgitation, systemic HTN and rheumatoid arthritis on Methotrexate, being evaluated for mitral valve repair vs replacement.   Subjective:    5/5 Underwent upper teeth extraction. Given Decadron.  5/6 RHC -> started on milrinone for elevated PA pressures and elevated PVR.  5/9 S/P bioprosthetic MVR Clipping atrial appendage.  5/10 Started on IV lasix. Milrinone cut back to 0.3.  5/11 Milrinone cut back to 0.25 mcg.  5/12 Milrinone reduced to 0.125 mcg   On milrinone 0.125 co-ox 48% Diuresing well on IV lasix. Weight down 5 more pounds. Breathing ok. Ambulating halls slowly. Renal function stable  BP elevated  Objective:   Weight Range: 68.7 kg Body mass index is 26 kg/m.   Vital Signs:   Temp:  [98.3 F (36.8 C)-98.8 F (37.1 C)] 98.3 F (36.8 C) (05/14 0807) Pulse Rate:  [66-88] 79 (05/14 0824) Resp:  [12-31] 24 (05/14 0824) BP: (112-182)/(49-148) 152/64 (05/14 0800) SpO2:  [88 %-96 %] 96 % (05/14 0824) Weight:  [68.7 kg] 68.7 kg (05/14 0600) Last BM Date: 11/01/20  Weight change: Filed Weights   10/31/20 0500 11/01/20 0643 11/02/20 0600  Weight: 69.7 kg 70.9 kg 68.7 kg    Intake/Output:   Intake/Output Summary (Last 24 hours) at 11/02/2020 0838 Last data filed at 11/02/2020 0817 Gross per 24 hour  Intake 735.06 ml  Output 2825 ml  Net -2089.94 ml      Physical Exam   General:  Sitting in chair  No resp difficulty HEENT: normal Neck: supple. JVP not markedly elevated. Carotids 2+ bilat; no bruits. No lymphadenopathy or thryomegaly appreciated. Cor: Sternum well healed PMI nondisplaced. Regular rate & rhythm. No rubs, gallops or murmurs. Lungs: clear Abdomen: soft,  nontender, nondistended. No hepatosplenomegaly. No bruits or masses. Good bowel sounds. Extremities: no cyanosis, clubbing, rash, edema Neuro: alert & orientedx3, cranial nerves grossly intact. moves all 4 extremities w/o difficulty. Affect pleasant   Telemetry   NSR 70-80s Personally reviewed  Labs    CBC Recent Labs    11/01/20 0439 11/02/20 0451  WBC 13.2* 14.2*  HGB 7.6* 8.0*  HCT 22.2* 23.5*  MCV 82.8 82.5  PLT 113* 950*   Basic Metabolic Panel Recent Labs    11/01/20 0439 11/01/20 1130 11/02/20 0435  NA 133*  --  134*  K 3.8  --  3.6  CL 104  --  101  CO2 26  --  25  GLUCOSE 90  --  90  BUN 9  --  7*  CREATININE 0.65  --  0.73  CALCIUM 8.2*  --  8.1*  MG  --  1.5* 2.1   Liver Function Tests Recent Labs    10/31/20 0415  AST 30  ALT 9  ALKPHOS 56  BILITOT 1.0  PROT 5.0*  ALBUMIN 2.6*   No results for input(s): LIPASE, AMYLASE in the last 72 hours. Cardiac Enzymes No results for input(s): CKTOTAL, CKMB, CKMBINDEX, TROPONINI in the last 72 hours.  BNP: BNP (last 3 results) Recent Labs    10/23/20 0006 10/27/20 0155  BNP 490.4* 149.3*    ProBNP (last 3 results) No results for input(s): PROBNP in the last 8760 hours.   D-Dimer No results for input(s): DDIMER in the last  72 hours. Hemoglobin A1C No results for input(s): HGBA1C in the last 72 hours. Fasting Lipid Panel No results for input(s): CHOL, HDL, LDLCALC, TRIG, CHOLHDL, LDLDIRECT in the last 72 hours. Thyroid Function Tests No results for input(s): TSH, T4TOTAL, T3FREE, THYROIDAB in the last 72 hours.  Invalid input(s): FREET3  Other results:   Imaging    DG Chest Port 1 View  Result Date: 11/02/2020 CLINICAL DATA:  Status post mitral valve repair with clipping of the left atrial appendage. EXAM: PORTABLE CHEST 1 VIEW COMPARISON:  10/31/2020 FINDINGS: Right arm PICC line tip projects over the distal SVC. Prosthetic mitral valve and left atrial appendage clip are again noted.  Stable cardiac enlargement. Interval improved aeration to the left lung base which may reflect resolving atelectasis and or pleural effusion. No new findings. IMPRESSION: Interval improved aeration to the left lung base. Electronically Signed   By: Kerby Moors M.D.   On: 11/02/2020 08:32     Medications:     Scheduled Medications: . acetaminophen  1,000 mg Oral Q6H  . bisacodyl  10 mg Oral Daily   Or  . bisacodyl  10 mg Rectal Daily  . Chlorhexidine Gluconate Cloth  6 each Topical Daily  . docusate sodium  200 mg Oral BID  . feeding supplement  1 Container Oral TID BM  . ferrous [id number] C-folic acid  1 capsule Oral Q breakfast  . furosemide  40 mg Intravenous BID  . levothyroxine  125 mcg Oral Q0600  . mouth rinse  15 mL Mouth Rinse BID  . metoCLOPramide (REGLAN) injection  10 mg Intravenous Q6H  . pantoprazole  40 mg Oral Daily  . polyethylene glycol  17 g Oral Daily  . sodium chloride flush  10-40 mL Intracatheter Q12H  . sodium chloride flush  3 mL Intravenous Q12H  . spironolactone  12.5 mg Oral Daily  . warfarin  1 mg Oral q1600  . Warfarin - Physician Dosing Inpatient   Does not apply q1600    Infusions: . sodium chloride    . lactated ringers Stopped (10/28/20 1515)  . lactated ringers Stopped (10/28/20 1515)  . milrinone 0.125 mcg/kg/min (11/02/20 0800)  . potassium chloride 10 mEq (11/02/20 0817)    PRN Medications: morphine injection, ondansetron (ZOFRAN) IV, oxyCODONE, sodium chloride flush, sodium chloride flush, traMADol    Assessment/Plan   1. Severe Rheumatic Mitral Valve Regurgitation --> 10/28/20 S/P MVR - Primary MR. LVEF normal 60-65%  - TEE this admit confirmed severe MR. LHC w/ normal coronaries - S/P Bioprosthetic MVR 5/9  - on Coumadin. INR 1.6  - CO-OX marginal at 48%. Continue milrinone at 0.125 mcg. Repeat co-ox today. If low will increase milrinone - Weight still up 5 pounds but with low co-ox will give lasix holiday today -  Renal function stable.    2. Severe Pulmonary Hypertension  - TTE 8/21 w/ severely elevated RVSP, 78 mmHg. 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 WHO Group 1 2/2 CTD (RA)  - Repeat RHC on 5/6 with improved hemodynamics but PA pressures and PVR still elevated.  - Continue milrinone as above  3. Acute on Chronic Diastolic Heart Failure w/ Predominant RV Dysfunction  - 2D Echo 8/21 showed normal LVEF, 60-65%, w/ mild LVH and G3DD c/w restrictive physiology. RV was mildly reduced  at the time - TEE 10/21/20 LVEF normal. RV systolic function is now moderately reduced w/ mild-mod TR, in setting of progressive/ now severe MR. - Weight still up 5 pounds but with low co-ox will give lasix holiday today - Continue Spiro 12.5 mg daily  - Start Entresto  - Consider Jardiance next (Hgb A1c 5.0).   4. Chronic Afib: in setting of severe MR w/ severe LAE. - HR controlled.  Started coumadin per Dr Roxy Manns.  - hgb 11 -> 9.4->8.8->8.2 ->7.5->7.6 -> 8.0  - INR  1.6  today  - No overt bleeding. Follow closely   5. Rheumatoid Arthritis - on MTX - followed by rheumatology   6. Hyponatremia - Sodium 134 today - c/w diuresis   7. Hypomagnesemia - Mg 2.1 today    Length of Stay: [place], MD  11/02/2020, 8:38 AM  Advanced Heart Failure Team Pager [phone] (M-F; 7a - 5p)  Please contact [facility] for night-coverage after hours (5p -7a ) and weekends on [email or web address]

## 2020-11-03 ENCOUNTER — Inpatient Hospital Stay (HOSPITAL_COMMUNITY): Payer: Medicare HMO

## 2020-11-03 DIAGNOSIS — I4821 Permanent atrial fibrillation: Secondary | ICD-10-CM | POA: Diagnosis not present

## 2020-11-03 DIAGNOSIS — I272 Pulmonary hypertension, unspecified: Secondary | ICD-10-CM | POA: Diagnosis not present

## 2020-11-03 DIAGNOSIS — I5033 Acute on chronic diastolic (congestive) heart failure: Secondary | ICD-10-CM | POA: Diagnosis not present

## 2020-11-03 DIAGNOSIS — I051 Rheumatic mitral insufficiency: Secondary | ICD-10-CM | POA: Diagnosis not present

## 2020-11-03 LAB — BASIC METABOLIC PANEL
Anion gap: 7 (ref 5–15)
BUN: 5 mg/dL — ABNORMAL LOW (ref 8–23)
CO2: 26 mmol/L (ref 22–32)
Calcium: 8.2 mg/dL — ABNORMAL LOW (ref 8.9–10.3)
Chloride: 100 mmol/L (ref 98–111)
Creatinine, Ser: 0.66 mg/dL (ref 0.44–1.00)
GFR, Estimated: >60 mL/min (ref >60)
Glucose, Bld: 130 mg/dL — ABNORMAL HIGH (ref 70–99)
Potassium: 3.6 mmol/L (ref 3.5–5.1)
Sodium: 133 mmol/L — ABNORMAL LOW (ref 135–145)

## 2020-11-03 LAB — COOXEMETRY PANEL
Carboxyhemoglobin: 1.6 % — ABNORMAL HIGH (ref 0.5–1.5)
Methemoglobin: 1 % (ref 0.0–1.5)
O2 Saturation: 55.8 %
Total hemoglobin: 9.1 g/dL — ABNORMAL LOW (ref 12.0–16.0)

## 2020-11-03 LAB — CBC
HCT: 26.7 % — ABNORMAL LOW (ref 36.0–46.0)
Hemoglobin: 9.1 g/dL — ABNORMAL LOW (ref 12.0–15.0)
MCH: 27.7 pg (ref 26.0–34.0)
MCHC: 34.1 g/dL (ref 30.0–36.0)
MCV: 81.4 fL (ref 80.0–100.0)
Platelets: 193 10*3/uL (ref 150–400)
RBC: 3.28 MIL/uL — ABNORMAL LOW (ref 3.87–5.11)
RDW: 16.4 % — ABNORMAL HIGH (ref 11.5–15.5)
WBC: 12.5 10*3/uL — ABNORMAL HIGH (ref 4.0–10.5)
nRBC: 0 % (ref 0.0–0.2)

## 2020-11-03 LAB — PROTIME-INR
INR: 1.7 — ABNORMAL HIGH (ref 0.8–1.2)
Prothrombin Time: 19.5 seconds — ABNORMAL HIGH (ref 11.4–15.2)

## 2020-11-03 MED ORDER — MILRINONE LACTATE IN DEXTROSE 20-5 MG/100ML-% IV SOLN
0.1250 ug/kg/min | INTRAVENOUS | Status: DC
  Administered 2020-11-03 (×2): 0.125 ug/kg/min via INTRAVENOUS
  Filled 2020-11-03: qty 100

## 2020-11-03 MED ORDER — POTASSIUM CHLORIDE CRYS ER 20 MEQ PO TBCR
40.0000 meq | EXTENDED_RELEASE_TABLET | Freq: Once | ORAL | Status: AC
  Administered 2020-11-03: 40 meq via ORAL
  Filled 2020-11-03: qty 2

## 2020-11-03 NOTE — Plan of Care (Signed)
  Problem: Education: Goal: Knowledge of General Education information will improve Description: Including pain rating scale, medication(s)/side effects and non-pharmacologic comfort measures 11/03/2020 2322 by Brand Males, RN Outcome: Progressing 11/03/2020 2322 by Brand Males, RN Outcome: Progressing   Problem: Health Behavior/Discharge Planning: Goal: Ability to manage health-related needs will improve 11/03/2020 2322 by Brand Males, RN Outcome: Progressing 11/03/2020 2322 by Brand Males, RN Outcome: Progressing   Problem: Clinical Measurements: Goal: Ability to maintain clinical measurements within normal limits will improve 11/03/2020 2322 by Brand Males, RN Outcome: Progressing 11/03/2020 2322 by Brand Males, RN Outcome: Progressing Goal: Diagnostic test results will improve 11/03/2020 2322 by Brand Males, RN Outcome: Progressing 11/03/2020 2322 by Brand Males, RN Outcome: Progressing Goal: Cardiovascular complication will be avoided 11/03/2020 2322 by Brand Males, RN Outcome: Progressing 11/03/2020 2322 by Brand Males, RN Outcome: Progressing   Problem: Elimination: Goal: Will not experience complications related to bowel motility 11/03/2020 2322 by Brand Males, RN Outcome: Progressing 11/03/2020 2322 by Brand Males, RN Outcome: Progressing   Problem: Education: Goal: Ability to demonstrate management of disease process will improve Outcome: Progressing Goal: Ability to verbalize understanding of medication therapies will improve Outcome: Progressing Goal: Individualized Educational Video(s) Outcome: Progressing   Problem: Activity: Goal: Capacity to carry out activities will improve Outcome: Progressing   Problem: Cardiac: Goal: Ability to achieve and maintain adequate cardiopulmonary perfusion will improve Outcome: Progressing   Problem: Education: Goal: Will demonstrate proper wound care and an  understanding of methods to prevent future damage Outcome: Progressing Goal: Knowledge of disease or condition will improve Outcome: Progressing Goal: Knowledge of the prescribed therapeutic regimen will improve Outcome: Progressing Goal: Individualized Educational Video(s) Outcome: Progressing   Problem: Activity: Goal: Risk for activity intolerance will decrease Outcome: Progressing   Problem: Cardiac: Goal: Will achieve and/or maintain hemodynamic stability Outcome: Progressing   Problem: Clinical Measurements: Goal: Postoperative complications will be avoided or minimized Outcome: Progressing   Problem: Respiratory: Goal: Respiratory status will improve Outcome: Progressing   Problem: Skin Integrity: Goal: Wound healing without signs and symptoms of infection Outcome: Progressing Goal: Risk for impaired skin integrity will decrease Outcome: Progressing   Problem: Urinary Elimination: Goal: Ability to achieve and maintain adequate renal perfusion and functioning will improve Outcome: Progressing

## 2020-11-03 NOTE — Progress Notes (Signed)
Advanced Heart Failure Rounding Note  PCP-Cardiologist: Dr. Gwenlyn Found AHF: Dr. [name]   Patient Profile   [name], followed by Dr. Gwenlyn Found, w/ h/o chronic diastolic heart failure w/ predominant RV dysfunction + pulmonary hypertension, chronic atrial fibrillation on Eliquis, severe mitral regurgitation, systemic HTN and rheumatoid arthritis on Methotrexate, being evaluated for mitral valve repair vs replacement.   Subjective:    5/5 Underwent upper teeth extraction. Given Decadron.  5/6 RHC -> started on milrinone for elevated PA pressures and elevated PVR.  5/9 S/P bioprosthetic MVR Clipping atrial appendage.  5/10 Started on IV lasix. Milrinone cut back to 0.3.  5/11 Milrinone cut back to 0.25 mcg.  5/12 Milrinone reduced to 0.125 mcg  5/14 milrinone increased to 0.25  On milrinone 0.25 co-ox 56%. Remains on lasix 40 IV bid.  Weight down 2 pounds.   Feels good. Denies CP or SOB.   Objective:   Weight Range: 67.6 kg Body mass index is 25.58 kg/m.   Vital Signs:   Temp:  [97.8 F (36.6 C)-99 F (37.2 C)] 97.8 F (36.6 C) (05/15 1132) Pulse Rate:  [67-86] 82 (05/15 1132) Resp:  [16-28] 16 (05/15 1132) BP: (100-165)/(59-74) 100/69 (05/15 1132) SpO2:  [93 %-100 %] 94 % (05/15 1132) Weight:  [67.6 kg] 67.6 kg (05/15 0425) Last BM Date: 11/01/20  Weight change: Filed Weights   11/01/20 0643 11/02/20 0600 11/03/20 0425  Weight: 70.9 kg 68.7 kg 67.6 kg    Intake/Output:   Intake/Output Summary (Last 24 hours) at 11/03/2020 1208 Last data filed at 11/03/2020 1200 Gross per 24 hour  Intake 237 ml  Output 2350 ml  Net -2113 ml      Physical Exam   General:  Sitting in chair  No resp difficulty HEENT: normal Neck: supple. JVP 8-9 Carotids 2+ bilat; no bruits. No lymphadenopathy or thryomegaly appreciated. Cor: sternal wound ok . Regular rate & rhythm. No rubs, gallops or murmurs. Lungs: clear Abdomen: soft, nontender, nondistended. No hepatosplenomegaly. No  bruits or masses. Good bowel sounds. Extremities: no cyanosis, clubbing, rash, tr edema Neuro: alert & orientedx3, cranial nerves grossly intact. moves all 4 extremities w/o difficulty. Affect pleasant   Telemetry   NSR 70-80s Personally reviewed  Labs    CBC Recent Labs    11/02/20 0451 11/03/20 0547  WBC 14.2* 12.5*  HGB 8.0* 9.1*  HCT 23.5* 26.7*  MCV 82.5 81.4  PLT 149* 591   Basic Metabolic Panel Recent Labs    11/01/20 1130 11/02/20 0435 11/03/20 0547  NA  --  134* 133*  K  --  3.6 3.6  CL  --  101 100  CO2  --  25 26  GLUCOSE  --  90 130*  BUN  --  7* 5*  CREATININE  --  0.73 0.66  CALCIUM  --  8.1* 8.2*  MG 1.5* 2.1  --    Liver Function Tests No results for input(s): AST, ALT, ALKPHOS, BILITOT, PROT, ALBUMIN in the last 72 hours. No results for input(s): LIPASE, AMYLASE in the last 72 hours. Cardiac Enzymes No results for input(s): CKTOTAL, CKMB, CKMBINDEX, TROPONINI in the last 72 hours.  BNP: BNP (last 3 results) Recent Labs    10/23/20 0006 10/27/20 0155  BNP 490.4* 149.3*    ProBNP (last 3 results) No results for input(s): PROBNP in the last 8760 hours.   D-Dimer No results for input(s): DDIMER in the last 72 hours. Hemoglobin A1C No results for input(s): HGBA1C in  the last 72 hours. Fasting Lipid Panel No results for input(s): CHOL, HDL, LDLCALC, TRIG, CHOLHDL, LDLDIRECT in the last 72 hours. Thyroid Function Tests No results for input(s): TSH, T4TOTAL, T3FREE, THYROIDAB in the last 72 hours.  Invalid input(s): FREET3  Other results:   Imaging    DG Chest 2 View  Result Date: 11/03/2020 CLINICAL DATA:  History of open heart surgery. EXAM: CHEST - 2 VIEW COMPARISON:  11/02/2020 FINDINGS: RIGHT-sided PICC line tip overlies the superior vena cava. LEFT atrial appendage clip stable in appearance. Prior annuloplasty of the mitral valve. Heart is enlarged and stable in configuration. Mildly prominent interstitial markings are stable.  There is LEFT LOWER lobe atelectasis. No new consolidations. Small bilateral pleural effusions are present. IMPRESSION: Stable cardiomegaly. Stable LEFT LOWER lobe atelectasis. Electronically Signed   By: Nolon Nations M.D.   On: 11/03/2020 08:09     Medications:     Scheduled Medications: . bisacodyl  10 mg Oral Daily   Or  . bisacodyl  10 mg Rectal Daily  . Chlorhexidine Gluconate Cloth  6 each Topical Daily  . docusate sodium  200 mg Oral BID  . feeding supplement  1 Container Oral TID BM  . ferrous [id number] C-folic acid  1 capsule Oral Q breakfast  . furosemide  40 mg Intravenous BID  . levothyroxine  125 mcg Oral Q0600  . mouth rinse  15 mL Mouth Rinse BID  . metoCLOPramide (REGLAN) injection  10 mg Intravenous Q6H  . pantoprazole  40 mg Oral Daily  . polyethylene glycol  17 g Oral Daily  . sacubitril-valsartan  1 tablet Oral BID  . spironolactone  12.5 mg Oral Daily  . warfarin  2 mg Oral q1600  . Warfarin - Physician Dosing Inpatient   Does not apply q1600    Infusions: . milrinone 0.125 mcg/kg/min (11/03/20 1111)    PRN Medications: ondansetron (ZOFRAN) IV, oxyCODONE, sodium chloride flush, sodium chloride flush, traMADol    Assessment/Plan   1. Severe Rheumatic Mitral Valve Regurgitation --> 10/28/20 S/P MVR - Primary MR. LVEF normal 60-65%  - TEE this admit confirmed severe MR. LHC w/ normal coronaries - S/P Bioprosthetic MVR 5/9  - on Coumadin. INR 1.7 - On milrinone 0.25 co-ox 56%. Wean milrinone to 0.125 - Weight back to baseline. Can stop IV lasix. Likely start po soon  - Renal function stable.    2. Severe Pulmonary Hypertension  - TTE 8/21 w/ severely elevated RVSP, 78 mmHg. 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 WHO Group 1 2/2 CTD (RA)  - Repeat RHC on 5/6 with  improved hemodynamics but PA pressures and PVR still elevated.  - Co-ox was low on 5/14. Milrinone increased. Co-ox back up today.  - Will drop milrinone to 0.125 as above  3. Acute on Chronic Diastolic Heart Failure w/ Predominant RV Dysfunction  - 2D Echo 8/21 showed normal LVEF, 60-65%, w/ mild LVH and G3DD c/w restrictive physiology. RV was mildly reduced at the time - TEE 10/21/20 LVEF normal. RV systolic function is now moderately reduced w/ mild-mod TR, in setting of progressive/ now severe MR. - Weight still up 5 pounds but with low co-ox will give lasix holiday today - Continue Spiro 12.5 mg daily  - Continue Entresto 24/26 bid - Consider Jardiance next (Hgb  A1c 5.0).   4. Chronic Afib: in setting of severe MR w/ severe LAE. - HR controlled.  Started coumadin per Dr Roxy Manns.  - hgb 11 -> 9.4->8.8->8.2 ->7.5->7.6 -> 8.0 -> 9.1 - INR  1.7  today  - No overt bleeding. Follow closely   5. Rheumatoid Arthritis - on MTX - followed by rheumatology   6. Hyponatremia - Sodium 133 today  Wean milrinone. Ambulate. Stop lasix  Length of Stay: 11  Glori Bickers, MD  11/03/2020, 12:08 PM  Advanced Heart Failure Team Pager [phone] (M-F; 7a - 5p)  Please contact [facility] for night-coverage after hours (5p -7a ) and weekends on [email or web address]

## 2020-11-03 NOTE — Progress Notes (Signed)
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 (N/A) CLIPPING OF ATRIAL APPENDAGE USING ATRICURE  CLIP SIZE 45MM (N/A) TRANSESOPHAGEAL ECHOCARDIOGRAM (TEE) (N/A) Subjective: No complaints  Objective: Vital signs in last 24 hours: Temp:  [98.2 F (36.8 C)-99 F (37.2 C)] 99 F (37.2 C) (05/15 0800) Pulse Rate:  [67-86] 75 (05/15 0800) Cardiac Rhythm: Atrial fibrillation (05/15 0700) Resp:  [14-28] 20 (05/15 0800) BP: (141-178)/(59-74) 165/61 (05/15 0800) SpO2:  [93 %-100 %] 93 % (05/15 0800) Weight:  [67.6 kg] 67.6 kg (05/15 0425)  Hemodynamic parameters for last 24 hours: CVP:  [4 mmHg] 4 mmHg  Intake/Output from previous day: 05/14 0701 - 05/15 0700 In: 429.5 [P.O.:240; I.V.:15; IV Piggyback:174.5] Out: 2450 [Urine:2450] Intake/Output this shift: No intake/output data recorded.  General appearance: alert and cooperative Neurologic: intact Heart: regular rate and rhythm, S1, S2 normal, no murmur, click, rub or gallop Lungs: clear to auscultation bilaterally Abdomen: soft, non-tender; bowel sounds normal; no masses,  no organomegaly Extremities: extremities normal, atraumatic, no cyanosis or edema Wound: c/d/i  Lab Results: Recent Labs    11/02/20 0451 11/03/20 0547  WBC 14.2* 12.5*  HGB 8.0* 9.1*  HCT 23.5* 26.7*  PLT 149* 193   BMET:  Recent Labs    11/02/20 0435 11/03/20 0547  NA 134* 133*  K 3.6 3.6  CL 101 100  CO2 25 26  GLUCOSE 90 130*  BUN 7* 5*  CREATININE 0.73 0.66  CALCIUM 8.1* 8.2*    PT/INR:  Recent Labs    11/03/20 0547  LABPROT 19.5*  INR 1.7*   ABG    Component Value Date/Time   PHART 7.357 10/28/2020 2334   HCO3 24.8 10/28/2020 2334   TCO2 26 10/28/2020 2334   ACIDBASEDEF 1.0 10/28/2020 2334   O2SAT 55.8 11/03/2020 0547   CBG (last 3)  No results for input(s): GLUCAP in the last 72 hours.  Assessment/Plan: S/P Procedure(s) (LRB): MITRAL VALVE REPLACEMENT (MVR)  USING MEDTRONIC MOSAIC VALVE SIZE 29MM (N/A) CLIPPING OF ATRIAL APPENDAGE USING ATRICURE  CLIP SIZE 45MM (N/A) TRANSESOPHAGEAL ECHOCARDIOGRAM (TEE) (N/A) Mobilize Diuresis wean milrinone    LOS: 11 days    Joanna Hood 11/03/2020

## 2020-11-03 NOTE — Progress Notes (Signed)
Mobility Specialist - Progress Note   11/03/20 1048  Mobility  Activity Ambulated in hall  Level of Assistance Standby assist, set-up cues, supervision of patient - no hands on  Assistive Device None  Distance Ambulated (ft) 380 ft (300 ft x 1, 80 ft x 1)  Mobility Ambulated with assistance in hallway  Mobility Response Tolerated well  Mobility performed by Mobility specialist  $Mobility charge 1 Mobility   Pre-mobility: 80 HR, 145/74 BP, 97% SpO2 During mobility: 90 HR Post-mobility: 86 HR, 135/67 BP, 92% SpO2  Pt required one standing rest break due to SOB. Pt to recliner after walk, call bell at side and daughter in room.   [name] Mobility Specialist Mobility Specialist Phone: [phone]

## 2020-11-03 NOTE — Plan of Care (Signed)
  Problem: Education: Goal: Knowledge of General Education information will improve Description: Including pain rating scale, medication(s)/side effects and non-pharmacologic comfort measures Outcome: Progressing   Problem: Health Behavior/Discharge Planning: Goal: Ability to manage health-related needs will improve Outcome: Progressing   Problem: Clinical Measurements: Goal: Ability to maintain clinical measurements within normal limits will improve Outcome: Progressing Goal: Diagnostic test results will improve Outcome: Progressing Goal: Cardiovascular complication will be avoided Outcome: Progressing   Problem: Elimination: Goal: Will not experience complications related to bowel motility Outcome: Progressing   Problem: Education: Goal: Ability to demonstrate management of disease process will improve Outcome: Progressing Goal: Ability to verbalize understanding of medication therapies will improve Outcome: Progressing Goal: Individualized Educational Video(s) Outcome: Progressing   Problem: Activity: Goal: Capacity to carry out activities will improve Outcome: Progressing   Problem: Cardiac: Goal: Ability to achieve and maintain adequate cardiopulmonary perfusion will improve Outcome: Progressing   Problem: Education: Goal: Will demonstrate proper wound care and an understanding of methods to prevent future damage Outcome: Progressing Goal: Knowledge of disease or condition will improve Outcome: Progressing Goal: Knowledge of the prescribed therapeutic regimen will improve Outcome: Progressing Goal: Individualized Educational Video(s) Outcome: Progressing   Problem: Activity: Goal: Risk for activity intolerance will decrease Outcome: Progressing   Problem: Cardiac: Goal: Will achieve and/or maintain hemodynamic stability Outcome: Progressing   Problem: Clinical Measurements: Goal: Postoperative complications will be avoided or minimized Outcome:  Progressing   Problem: Respiratory: Goal: Respiratory status will improve Outcome: Progressing   Problem: Skin Integrity: Goal: Wound healing without signs and symptoms of infection Outcome: Progressing Goal: Risk for impaired skin integrity will decrease Outcome: Progressing   Problem: Urinary Elimination: Goal: Ability to achieve and maintain adequate renal perfusion and functioning will improve Outcome: Progressing   

## 2020-11-04 ENCOUNTER — Other Ambulatory Visit (HOSPITAL_COMMUNITY): Payer: Self-pay

## 2020-11-04 DIAGNOSIS — I051 Rheumatic mitral insufficiency: Secondary | ICD-10-CM | POA: Diagnosis not present

## 2020-11-04 DIAGNOSIS — I272 Pulmonary hypertension, unspecified: Secondary | ICD-10-CM | POA: Diagnosis not present

## 2020-11-04 DIAGNOSIS — I5033 Acute on chronic diastolic (congestive) heart failure: Secondary | ICD-10-CM | POA: Diagnosis not present

## 2020-11-04 DIAGNOSIS — I4821 Permanent atrial fibrillation: Secondary | ICD-10-CM | POA: Diagnosis not present

## 2020-11-04 LAB — BASIC METABOLIC PANEL
Anion gap: 3 — ABNORMAL LOW (ref 5–15)
BUN: 6 mg/dL — ABNORMAL LOW (ref 8–23)
CO2: 27 mmol/L (ref 22–32)
Calcium: 8.2 mg/dL — ABNORMAL LOW (ref 8.9–10.3)
Chloride: 104 mmol/L (ref 98–111)
Creatinine, Ser: 0.79 mg/dL (ref 0.44–1.00)
GFR, Estimated: >60 mL/min (ref >60)
Glucose, Bld: 92 mg/dL (ref 70–99)
Potassium: 4.1 mmol/L (ref 3.5–5.1)
Sodium: 134 mmol/L — ABNORMAL LOW (ref 135–145)

## 2020-11-04 LAB — COOXEMETRY PANEL
Carboxyhemoglobin: 1.6 % — ABNORMAL HIGH (ref 0.5–1.5)
Methemoglobin: 1 % (ref 0.0–1.5)
O2 Saturation: 60.7 %
Total hemoglobin: 8.3 g/dL — ABNORMAL LOW (ref 12.0–16.0)

## 2020-11-04 LAB — PROTIME-INR
INR: 1.8 — ABNORMAL HIGH (ref 0.8–1.2)
Prothrombin Time: 21 seconds — ABNORMAL HIGH (ref 11.4–15.2)

## 2020-11-04 MED ORDER — SACUBITRIL-VALSARTAN 49-51 MG PO TABS
1.0000 | ORAL_TABLET | Freq: Two times a day (BID) | ORAL | Status: DC
  Administered 2020-11-04 – 2020-11-07 (×7): 1 via ORAL
  Filled 2020-11-04 (×8): qty 1

## 2020-11-04 MED ORDER — GLUCERNA SHAKE PO LIQD
237.0000 mL | Freq: Three times a day (TID) | ORAL | Status: DC
  Administered 2020-11-04 – 2020-11-07 (×7): 237 mL via ORAL

## 2020-11-04 MED ORDER — SPIRONOLACTONE 25 MG PO TABS
25.0000 mg | ORAL_TABLET | Freq: Every day | ORAL | Status: DC
  Administered 2020-11-04 – 2020-11-07 (×4): 25 mg via ORAL
  Filled 2020-11-04 (×4): qty 1

## 2020-11-04 NOTE — Progress Notes (Signed)
Mobility Specialist: Progress Note   11/04/20 1415  Mobility  Activity Ambulated in hall  Level of Assistance Independent  Assistive Device None  Distance Ambulated (ft) 500 ft  Mobility Ambulated with assistance in hallway  Mobility Response Tolerated well  Mobility performed by Mobility specialist  Bed Position Chair  $Mobility charge 1 Mobility   Post-Mobility: 73 HR, 93% SpO2  Pt stopped for one brief standing break during ambulation due to feeling SOB, otherwise asx. Pt back to chair after walk to take a nap. Call bell at her side.   [facility] Joanna Hood Mobility Specialist Mobility Specialist Phone: [phone]

## 2020-11-04 NOTE — Progress Notes (Signed)
CARDIAC REHAB PHASE I   PRE:  Rate/Rhythm: 68 ?1HB,NSR  BP:  Supine:   Sitting: 155/66  Standing:    SaO2: 100% 2L   95% RA  MODE:  Ambulation: 380 ft   POST:  Rate/Rhythm: 83  BP:  Supine:   Sitting: 147/54  Standing:    SaO2: 100% hall, 87-88%RA BSC room  Back to 2L [id number] Waited for pt to finish breakfast to walk. Tried RA in room and pt tolerated well. Pt walked 380 ft on RA, stopping twice to rest. Had sats monitored whole walk and did not desat until back in room. Put on 2L and sats to 97%. To BSC and then back to recliner. Encouraged IS. Will continue to try to wean oxygen. Pt very motivated. Asst x1 for equipment.   Graylon Good, RN BSN  11/04/2020 8:44 AM

## 2020-11-04 NOTE — Progress Notes (Addendum)
      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  Continues to do well.  + ambulation  Objective: Vital signs in last 24 hours: Temp:  [97.8 F (36.6 C)-99 F (37.2 C)] 98.5 F (36.9 C) (05/16 0423) Pulse Rate:  [66-82] 66 (05/16 0423) Cardiac Rhythm: Atrial fibrillation (05/16 0700) Resp:  [15-20] 20 (05/16 0423) BP: (100-165)/(55-69) 117/55 (05/16 0423) SpO2:  [90 %-99 %] 97 % (05/16 0423) Weight:  [68 kg] 68 kg (05/16 0423)  Intake/Output from previous day: 05/15 0701 - 05/16 0700 In: 237 [P.O.:237] Out: 500 [Urine:500]  General appearance: alert, cooperative and no distress Heart: regular rate and rhythm Lungs: clear to auscultation bilaterally Abdomen: soft, non-tender; bowel sounds normal; no masses,  no organomegaly Extremities: extremities normal, atraumatic, no cyanosis or edema Wound: clean and dry  Lab Results: Recent Labs    11/02/20 0451 11/03/20 0547  WBC 14.2* 12.5*  HGB 8.0* 9.1*  HCT 23.5* 26.7*  PLT 149* 193   BMET:  Recent Labs    11/03/20 0547 11/04/20 0500  NA 133* 134*  K 3.6 4.1  CL 100 104  CO2 26 27  GLUCOSE 130* 92  BUN 5* 6*  CREATININE 0.66 0.79  CALCIUM 8.2* 8.2*    PT/INR:  Recent Labs    11/04/20 0500  LABPROT 21.0*  INR 1.8*   ABG    Component Value Date/Time   PHART 7.357 10/28/2020 2334   HCO3 24.8 10/28/2020 2334   TCO2 26 10/28/2020 2334   ACIDBASEDEF 1.0 10/28/2020 2334   O2SAT 60.7 11/04/2020 0546   CBG (last 3)  No results for input(s): GLUCAP in the last 72 hours.  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 CV- stable, weaning Milrinone as able, Co-ox at 60, Entresto has been started, care per AHF 2. INR 1.8, repeat 2 mg daily, if no response will increase dose tomorrow 3. Pulm- wean oxygen as tolerated, continue IS 4. Renal- creatinine WNL, IV lasix stopped yesterday, on Spironolactone 5. Hypothyroidism- on synthroid 6. Expected post operative blood loss anemia, improving, up to 9.1 7. Expected post operative thrombocytopenia, resolved 8. Dispo- patient stable, weaning Milrinone as tolerated, INR slowly trending up, will increase dose if needed, continue current care   LOS: 12 days    Ellwood Handler, PA-C 11/04/2020    I have seen and examined the patient and agree with the assessment and plan as outlined.  Probably okay to stop milrinone.  May be ready for d/c soon.  Rexene Alberts, MD 11/04/2020 9:34 AM

## 2020-11-04 NOTE — Discharge Instructions (Signed)
Discharge Instructions:  1. You may shower, please wash incisions daily with soap and water and keep dry.  If you wish to cover wounds with dressing you may do so but please keep clean and change daily.  No tub baths or swimming until incisions have completely healed.  If your incisions become red or develop any drainage please call our office at [phone]  2. No Driving until cleared by Dr. Guy Sandifer office and you are no longer using narcotic pain medications  3. Monitor your weight daily.. Please use the same scale and weigh at same time... If you gain 5-10 lbs in 48 hours with associated lower extremity swelling, please contact our office at [phone]  4. Fever of 101.5 for at least 24 hours with no source, please contact our office at [phone]  5. Activity- up as tolerated, please walk at least 3 times per day.  Avoid strenuous activity, no lifting, pushing, or pulling with your arms over 8-10 lbs for a minimum of 6 weeks  6. If any questions or concerns arise, please do not hesitate to contact our office at [facility] [place], D.M.D. Phone: [phone] Fax: [phone]    MOUTH CARE AFTER SURGERY   FACTS:  Ice used in ice bag helps keep the swelling down, and can help lessen the pain.  It is easier to treat pain BEFORE it happens.  Spitting disturbs the clot and may cause bleeding to start again, or to get worse.  Smoking delays healing and can cause complications.  Sharing prescriptions can be dangerous.  Do not take medications not recently prescribed for you.  Antibiotics may stop birth control pills from working.  Use other means of birth control while on antibiotics.  Warm salt water rinses after the first 24 hours will help lessen the swelling:  Use 1/2 teaspoonful of table salt per oz.of water.  DO NOT:  Do not spit.    Do not drink through a straw.  Strongly advised not to smoke, dip  snuff or chew tobacco at least for 3 days.  Do not eat sharp or crunchy foods.  Avoid the area of surgery when chewing.  Do not stop your antibiotics before your instructions say to do so.  Do not eat hot foods until bleeding has stopped.  If you need to, let your food cool down to room temperature.  EXPECT:  Some swelling, especially first 2-3 days.  Soreness or discomfort in varying degrees.  Follow your dentist's instructions about how to handle pain before it starts.  Pinkish saliva or light blood in saliva, or on your pillow in the morning.  This can last around 24 hours.  Bruising inside or outside the mouth.  This may not show up until 2-3 days after surgery.  Don't worry, it will go away in time.  Pieces of "bone" may work themselves loose.  It's OK.  If they bother you, let us know.    WHAT TO DO IMMEDIATELY AFTER SURGERY:  Bite on gauze with steady pressure for 30-45 minutes at a time.  Switch out the gauze after 30-45 minutes for clean gauze, and continue this for 1-2 hours or until bleeding subsides.  Do not chew on the gauze.  Do not lie down flat.  Raise your head support especially for the first 24 hours.  Apply ice to your face on the side of the surgery.  You may apply it 20 minutes on and  a few minutes off.  Ice for 8-12 hours.  You may use ice up to 24 hours.  Before the numbness wears off, take a pain pill as instructed.  Prescription pain medication is not always required.  SWELLING:  Expect swelling for the first couple of days.  It should get better after that.  If swelling increases 3 days or so after surgery, let us know as soon as possible.  FEVER:  Take Tylenol every 4 hours if needed to lower your temperature, especially if it is at 100F or higher.  Drink lots of fluids.  If the fever does not go away, let us know.  BREATHING TROUBLE:  Any unusual difficulty breathing means you have to have someone bring you to the emergency room  ASAP.  BLEEDING:  Light oozing is expected for 24 hours or so.  Prop head up with pillows.  Do not spit.  Do not confuse bright red fresh flowing blood with lots of saliva colored with a little bit of blood.  If you notice some bleeding, place gauze or a tea bag where it is bleeding and apply CONSTANT pressure by biting down for 1 hour.  Avoid talking during this time.  Do not remove the gauze or tea bag during this hour to "check" the bleeding.  If you notice bright RED bleeding FLOWING out of particular area, and filling the floor of your mouth, put a wad of gauze on that area, bite down firmly and constantly.  Call us immediately.  If we're closed, have someone bring you to the emergency room.  ORAL HYGIENE:  Brush your teeth as usual after meals and before bedtime.  Use a soft toothbrush around the area of surgery.  DO NOT AVOID BRUSHING.  Otherwise bacteria(germs) will grow and may delay healing or encourage infection.  Since you cannot spit, just gently rinse and let the water flow out of your mouth.  DO NOT SWISH HARD.  EATING:  Cool liquids are a good point to start.  Increase to soft foods as tolerated.   PRESCRIPTIONS:  Follow the directions for your prescriptions exactly as written.  If your doctor gave you a narcotic pain medication, do not drive, operate machinery or drink alcohol when on that medication.   QUESTIONS?  Call our office during office hours [phone] or call the Emergency Room at [phone].  Information on my medicine - Coumadin   (Warfarin)  This medication education was reviewed with me or my healthcare representative as part of my discharge preparation.  The pharmacist that spoke with me during my hospital stay was:  Einar Grad, [facility]  Why was Coumadin prescribed for you? Coumadin was prescribed for you because you have a blood clot or a medical condition that can cause an increased risk of forming blood clots. Blood clots  can cause serious health problems by blocking the flow of blood to the heart, lung, or brain. Coumadin can prevent harmful blood clots from forming. As a reminder your indication for Coumadin is:   Blood Clot Prevention After Heart Valve Surgery  What test will check on my response to Coumadin? While on Coumadin (warfarin) you will need to have an INR test regularly to ensure that your dose is keeping you in the desired range. The INR (international normalized ratio) number is calculated from the result of the laboratory test called prothrombin time (PT).  If an INR APPOINTMENT HAS NOT ALREADY BEEN MADE FOR YOU please schedule an appointment to have this  lab work done by your health care provider within 7 days. Your INR goal is usually a number between:  2 to 3 or your provider may give you a more narrow range like 2-2.5.  Ask your health care provider during an office visit what your goal INR is.  What  do you need to  know  About  COUMADIN? Take Coumadin (warfarin) exactly as prescribed by your healthcare provider about the same time each day.  DO NOT stop taking without talking to the doctor who prescribed the medication.  Stopping without other blood clot prevention medication to take the place of Coumadin may increase your risk of developing a new clot or stroke.  Get refills before you run out.  What do you do if you miss a dose? If you miss a dose, take it as soon as you remember on the same day then continue your regularly scheduled regimen the next day.  Do not take two doses of Coumadin at the same time.  Important Safety Information A possible side effect of Coumadin (Warfarin) is an increased risk of bleeding. You should call your healthcare provider right away if you experience any of the following: ? Bleeding from an injury or your nose that does not stop. ? Unusual colored urine (red or dark brown) or unusual colored stools (red or black). ? Unusual bruising for unknown  reasons. ? A serious fall or if you hit your head (even if there is no bleeding).  Some foods or medicines interact with Coumadin (warfarin) and might alter your response to warfarin. To help avoid this: ? Eat a balanced diet, maintaining a consistent amount of Vitamin K. ? Notify your provider about major diet changes you plan to make. ? Avoid alcohol or limit your intake to 1 drink for women and 2 drinks for men per day. (1 drink is 5 oz. wine, 12 oz. beer, or 1.5 oz. liquor.)  Make sure that ANY health care provider who prescribes medication for you knows that you are taking Coumadin (warfarin).  Also make sure the healthcare provider who is monitoring your Coumadin knows when you have started a new medication including herbals and non-prescription products.  Coumadin (Warfarin)  Major Drug Interactions  Increased Warfarin Effect Decreased Warfarin Effect  Alcohol (large quantities) Antibiotics (esp. Septra/Bactrim, Flagyl, Cipro) Amiodarone (Cordarone) Aspirin (ASA) Cimetidine (Tagamet) Megestrol (Megace) NSAIDs (ibuprofen, naproxen, etc.) Piroxicam (Feldene) Propafenone (Rythmol SR) Propranolol (Inderal) Isoniazid (INH) Posaconazole (Noxafil) Barbiturates (Phenobarbital) Carbamazepine (Tegretol) Chlordiazepoxide (Librium) Cholestyramine (Questran) Griseofulvin Oral Contraceptives Rifampin Sucralfate (Carafate) Vitamin K   Coumadin (Warfarin) Major Herbal Interactions  Increased Warfarin Effect Decreased Warfarin Effect  Garlic Ginseng Ginkgo biloba Coenzyme Q10 Green tea St. John's wort    Coumadin (Warfarin) FOOD Interactions  Eat a consistent number of servings per week of foods HIGH in Vitamin K (1 serving =  cup)  Collards (cooked, or boiled & drained) Kale (cooked, or boiled & drained) Mustard greens (cooked, or boiled & drained) Parsley *serving size only =  cup Spinach (cooked, or boiled & drained) Swiss chard (cooked, or boiled & drained) Turnip  greens (cooked, or boiled & drained)  Eat a consistent number of servings per week of foods MEDIUM-HIGH in Vitamin K (1 serving = 1 cup)  Asparagus (cooked, or boiled & drained) Broccoli (cooked, boiled & drained, or raw & chopped) Brussel sprouts (cooked, or boiled & drained) *serving size only =  cup Lettuce, raw (green leaf, endive, romaine) Spinach, raw Turnip greens, raw & chopped  These websites have more information on Coumadin (warfarin):  FailFactory.se; VeganReport.com.au;

## 2020-11-04 NOTE — Progress Notes (Signed)
Nutrition Follow-up  DOCUMENTATION CODES:   Not applicable  INTERVENTION:   - d/c Boost Breeze  - Glucerna Shake po TID, each supplement provides 220 kcal and 10 grams of protein  - Continue MVI daily  NUTRITION DIAGNOSIS:   Increased nutrient needs related to post-op healing as evidenced by estimated needs.  Ongoing, being addressed via supplements  GOAL:   Patient will meet greater than or equal to 90% of their needs  Progressing  MONITOR:   PO intake,Supplement acceptance,Labs,Weight trends,Skin,I & O's  REASON FOR ASSESSMENT:   Consult Assessment of nutrition requirement/status  ASSESSMENT:   [name], followed by Dr. Gwenlyn Found, w/ h/o chronic diastolic heart failure w/ predominant RV dysfunction + pulmonary hypertension, chronic atrial fibrillation on Eliquis, severe mitral regurgitation, systemic HTN and rheumatoid arthritis on Methotrexate, being evaluated for mitral valve repair vs replacement.  5/02 - s/p TEE showing moderate RV dysfunction, mild bileaflet MVP with severe MR, mild to moderate TR 5/05 - s/p extraction of multiple teeth 5/09 - s/p MVR 5/10 - clear liquids 5/11 - heart healthy/carb modified diet  Spoke with pt at bedside. Pt reports appetite has been improving over the last few days. Pt with lunch tray at bedside and had completed ~75% of her meal (ate 100% of every food item on her meal tray except for meat). Pt aware of the importance of adequate nutrition in healing. She likes the [facility] but is willing to try Glucerna to increase kcal and protein.  Admission weight: 68.3 kg  Current weight: 68 kg  Meal Completion: 25-100% (no meal completions charted since 5/14)  Medications reviewed and include: dulcolax, colace, Boost Breeze TID, trinsicon, IV reglan q 6 hours, protonix, miralax, spironolactone, warfarin  Labs reviewed: hemoglobin 134, hemoglobin 9.1  Diet Order:   Diet Order            Diet heart healthy/carb modified Room  service appropriate? Yes; Fluid consistency: Thin  Diet effective now                 EDUCATION NEEDS:   Education needs have been addressed  Skin:  Skin Assessment: Skin Integrity Issues: Incisions: lip, chest, groin  Last BM:  11/03/20  Height:   Ht Readings from Last 1 Encounters:  10/28/20 5\' 4"  (1.626 m)    Weight:   Wt Readings from Last 1 Encounters:  11/04/20 68 kg    Ideal Body Weight:  54.5 kg  BMI:  Body mass index is 25.73 kg/m.  Estimated Nutritional Needs:   Kcal:  2050-2250  Protein:  105-120 grams  Fluid:  > 2 L    Gustavus Bryant, MS, RD, LDN Inpatient Clinical Dietitian Please see AMiON for contact information.

## 2020-11-04 NOTE — Progress Notes (Addendum)
Advanced Heart Failure Rounding Note  PCP-Cardiologist: Dr. Gwenlyn Found AHF: Dr. [name]   Patient Profile   [name], followed by Dr. Gwenlyn Found, w/ h/o chronic diastolic heart failure w/ predominant RV dysfunction + pulmonary hypertension, chronic atrial fibrillation on Eliquis, severe mitral regurgitation, systemic HTN and rheumatoid arthritis on Methotrexate, being evaluated for mitral valve repair vs replacement.   Subjective:    5/5 Underwent upper teeth extraction. Given Decadron.  5/6 RHC -> started on milrinone for elevated PA pressures and elevated PVR.  5/9 S/P bioprosthetic MVR Clipping atrial appendage.  5/10 Started on IV lasix. Milrinone cut back to 0.3.  5/11 Milrinone cut back to 0.25 mcg.  5/12 Milrinone reduced to 0.125 mcg  5/14 milrinone increased to 0.25 5/15 Milrinone reduced to 0.125   On milrinone 0.125 Co-ox 61%. SBPs  140s-160s  Back at pre-op wt 149 lb.   Sitting up in chair. No complaints currently.   Objective:   Weight Range: 68 kg Body mass index is 25.73 kg/m.   Vital Signs:   Temp:  [97.8 F (36.6 C)-98.7 F (37.1 C)] 98.7 F (37.1 C) (05/16 0738) Pulse Rate:  [63-82] 63 (05/16 0738) Resp:  [15-23] 23 (05/16 0738) BP: (100-157)/(55-69) 155/66 (05/16 0738) SpO2:  [90 %-99 %] 93 % (05/16 0738) Weight:  [68 kg] 68 kg (05/16 0423) Last BM Date: 11/03/20  Weight change: Filed Weights   11/02/20 0600 11/03/20 0425 11/04/20 0423  Weight: 68.7 kg 67.6 kg 68 kg    Intake/Output:   Intake/Output Summary (Last 24 hours) at 11/04/2020 0856 Last data filed at 11/03/2020 1200 Gross per 24 hour  Intake 237 ml  Output 500 ml  Net -263 ml      Physical Exam   PHYSICAL EXAM: General:  Well appearing. No respiratory difficulty HEENT: normal Neck: supple. no JVD. Carotids 2+ bilat; no bruits. No lymphadenopathy or thyromegaly appreciated. Cor: PMI nondisplaced. Regular rate & rhythm. No rubs, gallops or murmurs. Lungs: clear Abdomen:  soft, nontender, nondistended. No hepatosplenomegaly. No bruits or masses. Good bowel sounds. Extremities: no cyanosis, clubbing, rash, edema Neuro: alert & oriented x 3, cranial nerves grossly intact. moves all 4 extremities w/o difficulty. Affect pleasant.  Telemetry   NSR 60s-70s Personally reviewed  Labs    CBC Recent Labs    11/02/20 0451 11/03/20 0547  WBC 14.2* 12.5*  HGB 8.0* 9.1*  HCT 23.5* 26.7*  MCV 82.5 81.4  PLT 149* 062   Basic Metabolic Panel Recent Labs    11/01/20 1130 11/02/20 0435 11/02/20 0435 11/03/20 0547 11/04/20 0500  NA  --  134*   < > 133* 134*  K  --  3.6   < > 3.6 4.1  CL  --  101   < > 100 104  CO2  --  25   < > 26 27  GLUCOSE  --  90   < > 130* 92  BUN  --  7*   < > 5* 6*  CREATININE  --  0.73   < > 0.66 0.79  CALCIUM  --  8.1*   < > 8.2* 8.2*  MG 1.5* 2.1  --   --   --    < > = values in this interval not displayed.   Liver Function Tests No results for input(s): AST, ALT, ALKPHOS, BILITOT, PROT, ALBUMIN in the last 72 hours. No results for input(s): LIPASE, AMYLASE in the last 72 hours. Cardiac Enzymes No results for input(s): CKTOTAL,  CKMB, CKMBINDEX, TROPONINI in the last 72 hours.  BNP: BNP (last 3 results) Recent Labs    10/23/20 0006 10/27/20 0155  BNP 490.4* 149.3*    ProBNP (last 3 results) No results for input(s): PROBNP in the last 8760 hours.   D-Dimer No results for input(s): DDIMER in the last 72 hours. Hemoglobin A1C No results for input(s): HGBA1C in the last 72 hours. Fasting Lipid Panel No results for input(s): CHOL, HDL, LDLCALC, TRIG, CHOLHDL, LDLDIRECT in the last 72 hours. Thyroid Function Tests No results for input(s): TSH, T4TOTAL, T3FREE, THYROIDAB in the last 72 hours.  Invalid input(s): FREET3  Other results:   Imaging    No results found.   Medications:     Scheduled Medications: . bisacodyl  10 mg Oral Daily   Or  . bisacodyl  10 mg Rectal Daily  . Chlorhexidine Gluconate  Cloth  6 each Topical Daily  . docusate sodium  200 mg Oral BID  . feeding supplement  1 Container Oral TID BM  . ferrous [id number] C-folic acid  1 capsule Oral Q breakfast  . levothyroxine  125 mcg Oral Q0600  . mouth rinse  15 mL Mouth Rinse BID  . metoCLOPramide (REGLAN) injection  10 mg Intravenous Q6H  . pantoprazole  40 mg Oral Daily  . polyethylene glycol  17 g Oral Daily  . sacubitril-valsartan  1 tablet Oral BID  . spironolactone  12.5 mg Oral Daily  . warfarin  2 mg Oral q1600  . Warfarin - Physician Dosing Inpatient   Does not apply q1600    Infusions: . milrinone 0.125 mcg/kg/min (11/03/20 2125)    PRN Medications: ondansetron (ZOFRAN) IV, oxyCODONE, sodium chloride flush, sodium chloride flush, traMADol    Assessment/Plan   1. Severe Rheumatic Mitral Valve Regurgitation --> 10/28/20 S/P MVR - Primary MR. LVEF normal 60-65%  - TEE this admit confirmed severe MR. LHC w/ normal coronaries - S/P Bioprosthetic MVR 5/9  - on Coumadin. INR 1.8 - On milrinone 0.125 co-ox 61%. Stop milrinone today  - Weight back to baseline.  - Renal function stable.   2. Severe Pulmonary Hypertension  - TTE 8/21 w/ severely elevated RVSP, 78 mmHg. 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 WHO Group 1 2/2 CTD (RA)  - Repeat RHC on 5/6 with improved hemodynamics but PA pressures and PVR still elevated.  - Co-ox 61%. Stop milrinone   3. Acute on Chronic Diastolic Heart Failure w/ Predominant RV Dysfunction  - 2D Echo 8/21 showed normal LVEF, 60-65%, w/ mild LVH and G3DD c/w restrictive physiology. RV was mildly reduced at the time - TEE 10/21/20 LVEF normal. RV systolic function is now moderately reduced w/ mild-mod TR, in setting of progressive/ now severe MR. - Co-ox 61%. Stop milrinone  - Back at pre-op  wt. BP elevated - Increase Spiro to 25 mg daily  - Increase Entresto to 49-51 mg bid - Consider Jardiance next (Hgb A1c 5.0).   4. Chronic Afib: in setting of severe MR w/ severe LAE. - HR controlled.  Started coumadin per Dr Roxy Manns.  - hgb 11 -> 9.4->8.8->8.2 ->7.5->7.6 -> 8.0 -> 9.1 - INR  1.8  today  - No overt bleeding. Follow closely   5. Rheumatoid Arthritis - on MTX - followed by rheumatology  6. Hyponatremia - Sodium 134 today   Length of Stay: [address] PA-C  11/04/2020, 8:56 AM  Advanced Heart Failure Team Pager [phone] (M-F; 7a - 5p)  Please contact [facility] for night-coverage after hours (5p -7a ) and weekends on [email or web address]  Patient seen and examined with the above-signed Advanced Practice Provider and/or Housestaff. I personally reviewed laboratory data, imaging studies and relevant notes. I independently examined the patient and formulated the important aspects of the plan. I have edited the note to reflect any of my changes or salient points. I have personally discussed the plan with the patient and/or family.  Remains on milrinone. Co-ox 61% Volume status much improved. Renal function stable. INR 1.8. Hgb stable  General:  Sitting in chair  No resp difficulty HEENT: normal Neck: supple. no JVD. Carotids 2+ bilat; no bruits. No lymphadenopathy or thryomegaly appreciated. Cor: Sternal wound ok  Regular rate & rhythm. No rubs, gallops or murmurs. Lungs: clear Abdomen: soft, nontender, nondistended. No hepatosplenomegaly. No bruits or masses. Good bowel sounds. Extremities: no cyanosis, clubbing, rash, edema Neuro: alert & orientedx3, cranial nerves grossly intact. moves all 4 extremities w/o difficulty. Affect pleasant  Looks good. Stop milrinone today. Increase entesto and spiro. Continue to ambulate.   Glori Bickers, MD  10:24 AM

## 2020-11-04 NOTE — Progress Notes (Signed)
Physical Therapy Treatment Patient Details Name: Joanna Hood MRN: [id number] DOB: August 09, 1945 Today's Date: 11/04/2020    History of Present Illness pt is a 75 y/o female admitted 5/2 for evaluation of progressive dyspnea on exertion and increasing LE edema.  Work up for MVR vs replacement.  5/2 R and Left heart Cath with angiography,   5/5 multiple teeth extractions.  5/9  mitral valve replacement.  PMHx:  tricuspid regurg, RA, pulmonary HTN, ostioporosis, MVR, CHF, persistent Afib.    PT Comments    Pt very pleasant and moving well with increased gait and activity tolerance with pt educated for all precautions, transfers, pericare and bed mobility. Pt requires reinforcement of being able to state precautions but maintains without cues during activity.  HR 77-79 SpO2 90-96% on RA    Follow Up Recommendations  Supervision - Intermittent;No PT follow up     Equipment Recommendations  None recommended by PT    Recommendations for Other Services       Precautions / Restrictions Precautions Precautions: Fall;Sternal Precaution Comments: pt able to state 2/4 precautions with education for all    Mobility  Bed Mobility Overal bed mobility: Modified Independent Bed Mobility: Sit to Supine;Supine to Sit           General bed mobility comments: bed flat, no rail with pt able to transition supine<>sit without assist    Transfers Overall transfer level: Modified independent               General transfer comment: pt stood from bed, BSC and recliner with good hand placement  Ambulation/Gait Ambulation/Gait assistance: Supervision Gait Distance (Feet): 400 Feet Assistive device: None Gait Pattern/deviations: Step-through pattern;Decreased stride length   Gait velocity interpretation: 1.31 - 2.62 ft/sec, indicative of limited community ambulator General Gait Details: slow steady gait without UE support with cues for direction with Spo2 92-95% on RA   Stairs              Wheelchair Mobility    Modified Rankin (Stroke Patients Only)       Balance Overall balance assessment: Mild deficits observed, not formally tested                                          Cognition Arousal/Alertness: Awake/alert Behavior During Therapy: WFL for tasks assessed/performed Overall Cognitive Status: Within Functional Limits for tasks assessed                                        Exercises      General Comments        Pertinent Vitals/Pain Pain Assessment: No/denies pain    Home Living                      Prior Function            PT Goals (current goals can now be found in the care plan section) Acute Rehab PT Goals Time For Goal Achievement: 11/11/20 Potential to Achieve Goals: Good Progress towards PT goals: Progressing toward goals    Frequency           PT Plan Current plan remains appropriate    Co-evaluation              AM-PAC PT "6 Clicks"  Mobility   Outcome Measure  Help needed turning from your back to your side while in a flat bed without using bedrails?: None Help needed moving from lying on your back to sitting on the side of a flat bed without using bedrails?: None Help needed moving to and from a bed to a chair (including a wheelchair)?: None Help needed standing up from a chair using your arms (e.g., wheelchair or bedside chair)?: None Help needed to walk in hospital room?: A Little Help needed climbing 3-5 steps with a railing? : A Little 6 Click Score: 22    End of Session   Activity Tolerance: Patient tolerated treatment well Patient left: in chair;with call bell/phone within reach Nurse Communication: Mobility status PT Visit Diagnosis: Other abnormalities of gait and mobility (R26.89)     Time: 5638-7564 PT Time Calculation (min) (ACUTE ONLY): 27 min  Charges:  $Gait Training: 8-22 mins $Therapeutic Activity: 8-22 mins                      Lynann Demetrius P, PT Acute Rehabilitation Services Pager: [phone] Office: [place] 11/04/2020, 1:04 PM

## 2020-11-05 ENCOUNTER — Ambulatory Visit: Payer: Medicare HMO | Admitting: Cardiovascular Disease

## 2020-11-05 DIAGNOSIS — I5033 Acute on chronic diastolic (congestive) heart failure: Secondary | ICD-10-CM | POA: Diagnosis not present

## 2020-11-05 DIAGNOSIS — I051 Rheumatic mitral insufficiency: Secondary | ICD-10-CM | POA: Diagnosis not present

## 2020-11-05 DIAGNOSIS — I272 Pulmonary hypertension, unspecified: Secondary | ICD-10-CM | POA: Diagnosis not present

## 2020-11-05 DIAGNOSIS — Z953 Presence of xenogenic heart valve: Secondary | ICD-10-CM | POA: Diagnosis not present

## 2020-11-05 LAB — COOXEMETRY PANEL
Carboxyhemoglobin: 1.4 % (ref 0.5–1.5)
Methemoglobin: 1 % (ref 0.0–1.5)
O2 Saturation: 60.3 %
Total hemoglobin: 8.8 g/dL — ABNORMAL LOW (ref 12.0–16.0)

## 2020-11-05 LAB — PROTIME-INR
INR: 1.8 — ABNORMAL HIGH (ref 0.8–1.2)
Prothrombin Time: 21 seconds — ABNORMAL HIGH (ref 11.4–15.2)

## 2020-11-05 LAB — BASIC METABOLIC PANEL
Anion gap: 5 (ref 5–15)
BUN: 6 mg/dL — ABNORMAL LOW (ref 8–23)
CO2: 26 mmol/L (ref 22–32)
Calcium: 8.5 mg/dL — ABNORMAL LOW (ref 8.9–10.3)
Chloride: 104 mmol/L (ref 98–111)
Creatinine, Ser: 0.71 mg/dL (ref 0.44–1.00)
GFR, Estimated: >60 mL/min (ref >60)
Glucose, Bld: 82 mg/dL (ref 70–99)
Potassium: 4 mmol/L (ref 3.5–5.1)
Sodium: 135 mmol/L (ref 135–145)

## 2020-11-05 MED ORDER — FUROSEMIDE 20 MG PO TABS
20.0000 mg | ORAL_TABLET | Freq: Every day | ORAL | Status: DC
  Administered 2020-11-05 – 2020-11-06 (×2): 20 mg via ORAL
  Filled 2020-11-05 (×2): qty 1

## 2020-11-05 MED ORDER — WARFARIN SODIUM 4 MG PO TABS
4.0000 mg | ORAL_TABLET | Freq: Every day | ORAL | Status: DC
  Administered 2020-11-05 – 2020-11-06 (×2): 4 mg via ORAL
  Filled 2020-11-05 (×2): qty 1

## 2020-11-05 NOTE — Progress Notes (Addendum)
Advanced Heart Failure Rounding Note  PCP-Cardiologist: Dr. Gwenlyn Found AHF: Dr. [name]   Patient Profile   [name], followed by Dr. Gwenlyn Found, w/ h/o chronic diastolic heart failure w/ predominant RV dysfunction + pulmonary hypertension, chronic atrial fibrillation on Eliquis, severe mitral regurgitation, systemic HTN and rheumatoid arthritis on Methotrexate, being evaluated for mitral valve repair vs replacement.   Subjective:    5/5 Underwent upper teeth extraction. Given Decadron.  5/6 RHC -> started on milrinone for elevated PA pressures and elevated PVR.  5/9 S/P bioprosthetic MVR Clipping atrial appendage.  5/10 Started on IV lasix. Milrinone cut back to 0.3.  5/11 Milrinone cut back to 0.25 mcg.  5/12 Milrinone reduced to 0.125 mcg  5/14 milrinone increased to 0.25 5/15 Milrinone reduced to 0.125  5/16 Milrinone stopped   Co-ox stable off milrinone, 60%. Wt stable. INR 1.8   BP better, 144/50 prior to AM meds.   Feels well. No complaints.     Objective:   Weight Range: 68.3 kg Body mass index is 25.85 kg/m.   Vital Signs:   Temp:  [97.8 F (36.6 C)-98.6 F (37 C)] 98.6 F (37 C) (05/17 0729) Pulse Rate:  [54-79] 54 (05/17 0440) Resp:  [14-20] 17 (05/17 0729) BP: (127-164)/(58-73) 164/64 (05/17 0729) SpO2:  [92 %-100 %] 100 % (05/17 0440) Weight:  [68.3 kg] 68.3 kg (05/17 0440) Last BM Date: 11/03/20  Weight change: Filed Weights   11/03/20 0425 11/04/20 0423 11/05/20 0440  Weight: 67.6 kg 68 kg 68.3 kg    Intake/Output:   Intake/Output Summary (Last 24 hours) at 11/05/2020 0931 Last data filed at 11/05/2020 0300 Gross per 24 hour  Intake --  Output 900 ml  Net -900 ml      Physical Exam   PHYSICAL EXAM: General:  Well appearing. No respiratory difficulty HEENT: normal Neck: supple. no JVD. Carotids 2+ bilat; no bruits. No lymphadenopathy or thyromegaly appreciated. Cor: PMI nondisplaced. Irregularly irregular rhythm. No rubs, gallops or  murmurs. Lungs: clear Abdomen: soft, nontender, nondistended. No hepatosplenomegaly. No bruits or masses. Good bowel sounds. Extremities: no cyanosis, clubbing, rash, edema Neuro: alert & oriented x 3, cranial nerves grossly intact. moves all 4 extremities w/o difficulty. Affect pleasant.  Telemetry   NSR 60s-70s Personally reviewed  Labs    CBC Recent Labs    11/03/20 0547  WBC 12.5*  HGB 9.1*  HCT 26.7*  MCV 81.4  PLT 650   Basic Metabolic Panel Recent Labs    11/04/20 0500 11/05/20 0500  NA 134* 135  K 4.1 4.0  CL 104 104  CO2 27 26  GLUCOSE 92 82  BUN 6* 6*  CREATININE 0.79 0.71  CALCIUM 8.2* 8.5*   Liver Function Tests No results for input(s): AST, ALT, ALKPHOS, BILITOT, PROT, ALBUMIN in the last 72 hours. No results for input(s): LIPASE, AMYLASE in the last 72 hours. Cardiac Enzymes No results for input(s): CKTOTAL, CKMB, CKMBINDEX, TROPONINI in the last 72 hours.  BNP: BNP (last 3 results) Recent Labs    10/23/20 0006 10/27/20 0155  BNP 490.4* 149.3*    ProBNP (last 3 results) No results for input(s): PROBNP in the last 8760 hours.   D-Dimer No results for input(s): DDIMER in the last 72 hours. Hemoglobin A1C No results for input(s): HGBA1C in the last 72 hours. Fasting Lipid Panel No results for input(s): CHOL, HDL, LDLCALC, TRIG, CHOLHDL, LDLDIRECT in the last 72 hours. Thyroid Function Tests No results for input(s): TSH, T4TOTAL, T3FREE,  THYROIDAB in the last 72 hours.  Invalid input(s): FREET3  Other results:   Imaging    No results found.   Medications:     Scheduled Medications: . bisacodyl  10 mg Oral Daily   Or  . bisacodyl  10 mg Rectal Daily  . Chlorhexidine Gluconate Cloth  6 each Topical Daily  . docusate sodium  200 mg Oral BID  . feeding supplement (GLUCERNA SHAKE)  237 mL Oral TID BM  . ferrous [id number] C-folic acid  1 capsule Oral Q breakfast  . levothyroxine  125 mcg Oral Q0600  . mouth rinse   15 mL Mouth Rinse BID  . metoCLOPramide (REGLAN) injection  10 mg Intravenous Q6H  . pantoprazole  40 mg Oral Daily  . polyethylene glycol  17 g Oral Daily  . sacubitril-valsartan  1 tablet Oral BID  . spironolactone  25 mg Oral Daily  . warfarin  4 mg Oral q1600  . Warfarin - Physician Dosing Inpatient   Does not apply q1600    Infusions:   PRN Medications: ondansetron (ZOFRAN) IV, oxyCODONE, sodium chloride flush, sodium chloride flush, traMADol    Assessment/Plan   1. Severe Rheumatic Mitral Valve Regurgitation --> 10/28/20 S/P MVR - Primary MR. LVEF normal 60-65%  - TEE this admit confirmed severe MR. LHC w/ normal coronaries - S/P Bioprosthetic MVR 5/9  - on Coumadin. INR 1.8 - Co-ox stable off milrinone, 60%  - Weight back to baseline.  - Renal function stable.   2. Severe Pulmonary Hypertension  - TTE 8/21 w/ severely elevated RVSP, 78 mmHg. 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 WHO Group 1 2/2 CTD (RA)  - Repeat RHC on 5/6 with improved hemodynamics but PA pressures and PVR still elevated.  - now off milrinone   3. Acute on Chronic Diastolic Heart Failure w/ Predominant RV Dysfunction  - 2D Echo 8/21 showed normal LVEF, 60-65%, w/ mild LVH and G3DD c/w restrictive physiology. RV was mildly reduced at the time - TEE 10/21/20 LVEF normal. RV systolic function is now moderately reduced w/ mild-mod TR, in setting of progressive/ now severe MR. - Co-ox stable off milrinone, 60%  - Back at pre-op wt.  - Continue Spiro 25 mg daily  - Continue Entresto 49-51 mg bid - Consider Jardiance next (Hgb A1c 5.0).   4. Chronic Afib: in setting of severe MR w/ severe LAE. - HR controlled.  Started coumadin per Dr Roxy Manns.  - hgb 11 -> 9.4->8.8->8.2 ->7.5->7.6 -> 8.0 -> 9.1 - INR  1.8  today  - No overt  bleeding.  - CT surgery to increase coumadin to 4 mg  5. Rheumatoid Arthritis - on MTX - followed by rheumatology   6. Hyponatremia - Resolved, Sodium 135 today  Continue to ambulate. Per CT surgery, likely home next 24-48 hr   Length of Stay: [address], PA-C  11/05/2020, 9:31 AM  Advanced Heart Failure Team Pager [phone] (M-F; 7a - 5p)  Please contact [facility] for night-coverage after hours (5p -7a ) and weekends on [email or web address]  Patient seen and examined with the above-signed Advanced Practice Provider and/or Housestaff. I personally reviewed laboratory data, imaging studies and relevant notes. I independently examined the patient and formulated the important aspects of the plan. I have edited the note  to reflect any of my changes or salient points. I have personally discussed the plan with the patient and/or family.  Doing well off milrinone. Co-ox stable. Rhythm stable. INR 1.8  General:  Well appearing. No resp difficulty HEENT: normal Neck: supple. no JVD. Carotids 2+ bilat; no bruits. No lymphadenopathy or thryomegaly appreciated. Cor: PMI nondisplaced. Regular rate & rhythm. No rubs, gallops or murmurs. Lungs: clear Abdomen: soft, nontender, nondistended. No hepatosplenomegaly. No bruits or masses. Good bowel sounds. Extremities: no cyanosis, clubbing, rash, edema Neuro: alert & orientedx3, cranial nerves grossly intact. moves all 4 extremities w/o difficulty. Affect pleasant   Looks good. Continue to ambulate. Will give low dose lasix 20 daily for a week or so. Hopefully home tomorrow.   Glori Bickers, MD  10:25 AM

## 2020-11-05 NOTE — Discharge Summary (Signed)
[address]       [place] [address]             [phone]    Physician Discharge Summary  Patient ID: Joanna Hood MRN: [id number] DOB/AGE: 1945/07/29 75 y.o.  Admit date: 10/21/2020 Discharge date: 11/07/2020  Admission Diagnoses:  Patient Active Problem List   Diagnosis Date Noted  . Encounter for preoperative dental examination   . Poor dentition   . Dental caries   . Retained dental root   . Chronic periodontitis   . Loose, teeth   . Pulmonary hypertension ([place])   . Chronic diastolic congestive heart failure ([place])   . Dyspnea 10/21/2020  . Unintentional weight loss 09/17/2020  . Renal insufficiency 09/17/2020  . Cough 06/28/2020  . Grade III diastolic dysfunction 41/74/0814  . Mitral valve regurgitation   . Rheumatoid arthritis ([place]) 12/16/2015  . Health maintenance examination 04/23/2015  . Advanced care planning/counseling discussion 04/23/2015  . Medicare annual wellness visit, subsequent 11/03/2013  . Vitamin D deficiency 11/03/2013  . Microcytic anemia 09/02/2013  . Persistent atrial fibrillation ([place]) 04/29/2013  . HTN (hypertension), malignant 04/29/2013  . Osteopenia 01/01/2010  . POSTMENOPAUSAL STATUS 12/03/2009  . SNORING 02/08/2008  . GOITER, MULTINODULAR 02/16/2007  . Hypothyroidism 02/16/2007  . 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 History of lung abscess 05/23/1999   Discharge Diagnoses:  Patient Active Problem List   Diagnosis Date Noted  . 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  . Encounter for preoperative dental examination   . Poor dentition   . Dental caries   . Retained dental root   . Chronic periodontitis   . Loose, teeth   . Pulmonary hypertension ([place])   . Chronic diastolic congestive heart failure ([place])   . Dyspnea 10/21/2020  . Unintentional weight loss 09/17/2020  . Renal insufficiency 09/17/2020  . Cough 06/28/2020  . Grade III diastolic dysfunction  48/18/5631  . Mitral valve regurgitation   . Rheumatoid arthritis ([place]) 12/16/2015  . Health maintenance examination 04/23/2015  . Advanced care planning/counseling discussion 04/23/2015  . Medicare annual wellness visit, subsequent 11/03/2013  . Vitamin D deficiency 11/03/2013  . Microcytic anemia 09/02/2013  . Persistent atrial fibrillation ([place]) 04/29/2013  . HTN (hypertension), malignant 04/29/2013  . Osteopenia 01/01/2010  . POSTMENOPAUSAL STATUS 12/03/2009  . SNORING 02/08/2008  . GOITER, MULTINODULAR 02/16/2007  . Hypothyroidism 02/16/2007  . 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 History of lung abscess 05/23/1999   Discharged Condition: good  History of Present Illness:  Patient is a 75 year old African-American female with history of mitral regurgitation, longstanding persistent atrial fibrillation on long-term anticoagulation using Eliquis, hypertension, chronic diastolic congestive heart failure, pulmonary hypertension, rheumatoid arthritis on long-term methotrexate and chronic anemia who has been referred for surgical consultation to discuss treatment options for management of severe mitral regurgitation.   Patient states that she has been told that she had a heart murmur since she was very young.  She does not recall ever being told that she had rheumatic fever or rheumatic heart disease.  She has a long history of exertional shortness of breath and originally presented with acute exacerbation of chronic diastolic congestive heart failure in the setting of severe hypertension with new onset atrial fibrillation in 2014.  She has been followed intermittently ever since by Dr. Gwenlyn Found.     The patient states that symptoms of shortness of breath have progressed considerably over the past year or  74.  Follow-up echocardiogram performed February 19, 2020 revealed normal left ventricular size and systolic function with severe left atrial enlargement and severe mitral  regurgitation.  She was seen in follow-up several weeks ago at which time she complained that she got short of breath walking very short distances.  She also had significant lower extremity edema.  Her dose of Lasix was increased at that time and she was scheduled for elective transesophageal echocardiogram and diagnostic cardiac catheterization.  These were performed on 10/21/2020. TEE confirmed the presence of severe mitral regurgitation with severe left atrial enlargement.  Left ventricular ejection fraction was reported 55 to 60%.  There was evidence of moderately reduced right ventricular function and moderate tricuspid regurgitation.  Left and right heart catheterization revealed normal coronary anatomy with no significant coronary artery disease.  There was severe pulmonary hypertension (PA pressures 80/32 mmHg) with preserved cardiac output.  Mean right atrial pressure was 16 mmHg.  The patient was admitted to the hospital post procedure.  Hospital Course:  Post catheterization the patient developed shortness of breath with decreased saturations.  She was felt to be in acute on chronic CHF.  She was also noted to have pulmonary hypertension. Cardiothoracic consultation was requested for intervention on her Mitral Valve.  She was evaluated by Dr. Roxy Manns who felt intervention would be appropriate.  However, she would need medical tune up by the advanced heart failure service.  The patient was treated with aggressive diuretics.  She underwent repeat right heart cath which showed elevated PA pressures and elevated PVR.  She was initiated on Milrinone therapy.  She was taken to the operating room on 10/28/2020.  She underwent Mitral Valve Replacement with a 29 Edwards Mosaic Bioprosthetic Valve.  She tolerated the procedure and was taken to the SICU in stable condition.  She was extubated the evening of surgery without difficulty.  During her stay in the ICU she was weaned off Dopamine on 5/10.  Advanced heart  failure monitored the patient closely and assisted in management.  She was started on Coumadin for her mitral valve prosthesis. She was diuresed with IV Lasix for volume overloaded state.  Her chest tubes and arterial lines were removed without difficulty.  She developed a decreased Magnesium level and was treated with IV replacement.  She was started on Spironolactone for additional diuresis.  Her co-ox panel decreased, and she required increase in her Milrinone drip.  Her hemodynamics improved and her milrinone was stopped on 5/16.  She was started on Entresto which was titrated as BP allowed.  Her spironolactone dose was also titrated.  She was started on low dose Lasix.  She remains on coumadin.  Her most recent INR is 2.0.  She will be discharged home on 4 mg of coumadin.   Her Co-OX panel decreased to 47 %.  She was observed overnight.  Her CO-OX improved to 49 %. The patients surgical incisions are healing without evidence of infection.  She is ambulating without difficulty.  The patient is medically stable for discharge home today.  Consults: Advanced Heart Failure Service  Significant Diagnostic Studies: cardiac graphics:   Echocardiogram:   IMPRESSIONS    1. Left ventricular ejection fraction, by estimation, is 55 to 60%. The  left ventricle has normal function.  2. Right ventricular systolic function is moderately reduced. The right  ventricular size is normal.  3. Left atrial size was severely dilated. No left atrial/left atrial  appendage thrombus was detected.  4. The mitral valve is  abnormal. Severe mitral valve regurgitation. There  is mild holosystolic prolapse of both leaflets of the mitral valve.  5. Tricuspid valve regurgitation is moderate.  6. The aortic valve is tricuspid. Aortic valve regurgitation is not  visualized.  7. There is mild (Grade II) plaque involving the descending aorta.   Treatments: surgery:   Preoperative Diagnosis:        Severe Mitral  Regurgitation  Permanent Atrial Fibrillation  Postoperative Diagnosis:    Same  Procedure:       Mitral Valve Replacement              Medtronic Mosaic Stented Porcine Bioprosthetic Tissue Valve (size 25mm, model #310, serial [id number])             Clipping of left atrial appendage (Atricure Pro245 left atrial clip, size 59mm)               Surgeon:        Valentina Gu. Roxy Manns, MD  Assistant:       Ellwood Handler, PA-C  Discharge Exam: Blood pressure 106/61, pulse 60, temperature 99 F (37.2 C), temperature source Oral, resp. rate (!) 31, height 5\' 4"  (1.626 m), weight 68.8 kg, SpO2 93 %.  General appearance: alert, cooperative and no distress Heart: regular rate and rhythm Lungs: clear to auscultation bilaterally Abdomen: soft, non-tender; bowel sounds normal; no masses,  no organomegaly Extremities: edema trace Wound: clean and dry   Discharge Medications:  The patient has been discharged on:   1.Beta Blocker:  Yes [   ]                              No   [ x  ]                              If No, reason: heart failure  2.Ace Inhibitor/ARB: Yes Valu.[place]   ]                                     No  [    ]                                     If No, reason:  3.Statin:   Yes [   ]                  No  [ x  ]                  If No, reason: no CAD  4.[place]:  Yes  [ x  ]                  No   [   ]                  If No, reason:  Discharge Instructions    Amb Referral to Cardiac Rehabilitation   Complete by: As directed    Diagnosis: Valve Replacement   Valve: Mitral   After initial evaluation and assessments completed: Virtual Based Care may be provided alone or in conjunction with Phase 2 Cardiac Rehab based on patient barriers.: Yes     Allergies as of 11/07/2020  Reactions   Metronidazole Itching, Rash      Medication List    STOP taking these medications   amLODipine 10 MG tablet Commonly known as: NORVASC   Eliquis 5 MG Tabs tablet Generic drug:  apixaban   Metoprolol Tartrate 37.5 MG Tabs   potassium chloride SA 20 MEQ tablet Commonly known as: KLOR-CON   ramipril 10 MG capsule Commonly known as: ALTACE     TAKE these medications   acetaminophen 325 MG tablet Commonly known as: TYLENOL Take 650 mg by mouth every 6 (six) hours as needed (pain).   aspirin EC 81 MG tablet Take 81 mg by mouth once. Swallow whole.   Calcium-Vitamin D 600-400 MG-UNIT Tabs Take 1 tablet by mouth in the morning.   cholecalciferol 1000 units tablet Commonly known as: VITAMIN D Take 1,000 Units by mouth in the morning.   fluticasone 50 MCG/ACT nasal spray Commonly known as: FLONASE Place 2 sprays into both nostrils daily. What changed:   when to take this  reasons to take this   furosemide 20 MG tablet Commonly known as: Lasix Take 1 tablet (20 mg total) by mouth daily. What changed:   how much to take  when to take this   levothyroxine 125 MCG tablet Commonly known as: SYNTHROID Take 1 tablet (125 mcg total) by mouth daily before breakfast.   methotrexate 2.5 MG tablet Commonly known as: RHEUMATREX Take 15 mg by mouth every Friday. Caution:Chemotherapy. Protect from light.   sacubitril-valsartan 49-51 MG Commonly known as: ENTRESTO Take 1 tablet by mouth 2 (two) times daily.   spironolactone 25 MG tablet Commonly known as: ALDACTONE Take 1 tablet (25 mg total) by mouth daily.   traMADol 50 MG tablet Commonly known as: ULTRAM Take 1 tablet (50 mg total) by mouth every 6 (six) hours as needed for moderate pain.   warfarin 4 MG tablet Commonly known as: COUMADIN Take 1 tablet (4 mg total) by mouth daily at 4 PM.       Follow-up Information    Owsley, Madison B, DMD. Call in 1 week.   Specialty: Dentistry Why: As needed, For suture removal Contact information: [place] [address] [phone]        [facility] [place] Follow up on 11/25/2020.   Specialty:  Cardiothoracic Surgery Why: Appointment is at 2:00, please get CXR at 1:30 at [facility] located on first floor of our office building Contact information: [place], [place] Follow up.   Specialty: Cardiology Why: Nov 19, 2020 at 11:30 AM [facility] at [facility], [place] [phone] Contact information: [address] [id number] Mabel Morley       Ria Bush, MD Follow up.   Specialty: Family Medicine Why: Hospital follow up scheduled for Tuesday 11/12/20 at 11:00am Contact information: [place] [address] [phone]        [facility] Follow up.   Specialty: Cardiology Why: Your coumadin appointment is on 5/20 @10 :30am.  Contact information: [address] [place] [phone]              Signed:  Ellwood Handler, PA-C 11/07/2020, 8:30 AM

## 2020-11-05 NOTE — Plan of Care (Signed)
  Problem: Education: Goal: Knowledge of General Education information will improve Description: Including pain rating scale, medication(s)/side effects and non-pharmacologic comfort measures Outcome: Progressing   Problem: Health Behavior/Discharge Planning: Goal: Ability to manage health-related needs will improve Outcome: Progressing   Problem: Clinical Measurements: Goal: Ability to maintain clinical measurements within normal limits will improve Outcome: Progressing Goal: Diagnostic test results will improve Outcome: Progressing Goal: Cardiovascular complication will be avoided Outcome: Progressing   Problem: Elimination: Goal: Will not experience complications related to bowel motility Outcome: Progressing   Problem: Education: Goal: Ability to verbalize understanding of medication therapies will improve Outcome: Progressing   Problem: Activity: Goal: Capacity to carry out activities will improve Outcome: Progressing   Problem: Cardiac: Goal: Ability to achieve and maintain adequate cardiopulmonary perfusion will improve Outcome: Progressing

## 2020-11-05 NOTE — Progress Notes (Signed)
CARDIAC REHAB PHASE I   PRE:  Rate/Rhythm: 65 SR  BP:  Supine:   Sitting: 125/60  Standing:    SaO2: 95%RA  MODE:  Ambulation: 380 ft   POST:  Rate/Rhythm: 83  BP:  Supine:   Sitting: 168/1  Standing:    SaO2: 90-91%RA 1320-1400 Pt walked 380 ft on RA with hand held asst. Slightly SOB. Pt tolerated well though. Sats good on RA. To recliner with call bell. Discussed sternal precautions, IS, walking for ex, 2000 mg sodium restriction, weighing daily, signs of CHF, and CRP 2. Referred to [facility] program. Pt is ok with CRP 2 calling daughter if they cannot reach her.   Graylon Good, RN BSN  11/05/2020 1:58 PM

## 2020-11-05 NOTE — Progress Notes (Addendum)
      [address]       [place] [address]             [phone]      8 Days Post-Op Procedure(s) (LRB): MITRAL VALVE REPLACEMENT (MVR) USING MEDTRONIC MOSAIC VALVE SIZE 29MM (N/A) CLIPPING OF ATRIAL APPENDAGE USING ATRICURE  CLIP SIZE 45MM (N/A) TRANSESOPHAGEAL ECHOCARDIOGRAM (TEE) (N/A)   Subjective:  No new complaints.  She is feeling well.  + ambulation  + BM  Objective: Vital signs in last 24 hours: Temp:  [97.8 F (36.6 C)-98.6 F (37 C)] 98.6 F (37 C) (05/17 0729) Pulse Rate:  [54-79] 54 (05/17 0440) Cardiac Rhythm: Atrial flutter (05/17 0710) Resp:  [13-20] 17 (05/17 0729) BP: (127-164)/(58-73) 164/64 (05/17 0729) SpO2:  [92 %-100 %] 100 % (05/17 0440) Weight:  [68.3 kg] 68.3 kg (05/17 0440)  Intake/Output from previous day: 05/16 0701 - 05/17 0700 In: -  Out: 900 [Urine:900]  General appearance: alert, cooperative and no distress Heart: regular rate and rhythm Lungs: clear to auscultation bilaterally Abdomen: soft, non-tender; bowel sounds normal; no masses,  no organomegaly Extremities: edema trace Wound: clean and dry  Lab Results: Recent Labs    11/03/20 0547  WBC 12.5*  HGB 9.1*  HCT 26.7*  PLT 193   BMET:  Recent Labs    11/04/20 0500 11/05/20 0500  NA 134* 135  K 4.1 4.0  CL 104 104  CO2 27 26  GLUCOSE 92 82  BUN 6* 6*  CREATININE 0.79 0.71  CALCIUM 8.2* 8.5*    PT/INR:  Recent Labs    11/05/20 0500  LABPROT 21.0*  INR 1.8*   ABG    Component Value Date/Time   PHART 7.357 10/28/2020 2334   HCO3 24.8 10/28/2020 2334   TCO2 26 10/28/2020 2334   ACIDBASEDEF 1.0 10/28/2020 2334   O2SAT 60.3 11/05/2020 0522   CBG (last 3)  No results for input(s): GLUCAP in the last 72 hours.  Assessment/Plan: S/P Procedure(s) (LRB): MITRAL VALVE REPLACEMENT (MVR) USING MEDTRONIC MOSAIC VALVE SIZE 29MM (N/A) CLIPPING OF ATRIAL APPENDAGE USING ATRICURE  CLIP SIZE 45MM (N/A) TRANSESOPHAGEAL ECHOCARDIOGRAM (TEE)  (N/A)  1. CV- stable, off Milrinone, Coox at 60.3- continue Entresto, Spironolactone, medical optimization per AHF 2. INR 1.8, will increase coumadin to 4 mg daily 3. Pulm- no acute issues, continue IS 4. Renal-creatinine WNL, weight is trending down, diuretics per AHF 5.Expected post operative blood loss anemia- Hgb improved on iron 6. Dispo- patient stable, off Milrinone, Co-ox 60%, medication tuning per AHF, increase coumadin to 4 mg daily, continue current care, likely ready for d/c in the next 24-48 hours   LOS: 13 days    Ellwood Handler, PA-C 11/05/2020   I have seen and examined the patient and agree with the assessment and plan as outlined. Doing very well and appears ready for d/c home pending recommendations by Dr [name] and his team  Rexene Alberts, MD 11/05/2020 8:00 AM

## 2020-11-05 NOTE — TOC Progression Note (Signed)
Transition of Care (TOC) - Progression Note  Heart Failure   Patient Details  Name: Joanna Hood MRN: [id number] Date of Birth: 16-Nov-1945  [facility] of [facility]) CM/SW Early, [place] Phone Number: 11/05/2020, 12:42 PM  Clinical Narrative:    CSW spoke with patient at bedside to bring her an appointment card for the [facility] outpatient clinic and encouraged her to follow up and to attend the appointment and bring their medications and if anything changes to please reach out so that CSW/HV clinic team can provide support. Joanna Hood reported her daughter will bring her to the appointment and she will also pick her up when she is ready for discharge. CSW scheduled Joanna Hood a hospital follow up with her primary care doctor for 11/12/20 Tuesday at 11am and provided Joanna Hood with that information as well.  CSW will continue to follow throughout discharge.    Barriers to Discharge: Continued Medical Work up  Expected Discharge Plan and Services   In-house Referral: Clinical Social Work       Expected Discharge Date: 10/21/20                                     Social Determinants of Health (SDOH) Interventions Food Insecurity Interventions: Intervention Not Indicated Financial Strain Interventions: Intervention Not Indicated Housing Interventions: Intervention Not Indicated Transportation Interventions: Intervention Not Indicated  Readmission Risk Interventions No flowsheet data found.  Joanna Hood, MSW, [place] Heart Failure Social Worker

## 2020-11-05 NOTE — Progress Notes (Signed)
Mobility Specialist - Progress Note   11/05/20 1044  Mobility  Activity Ambulated in hall  Level of Assistance Independent  Assistive Device None  Distance Ambulated (ft) 340 ft  Mobility Ambulated with assistance in hallway  Mobility Response Tolerated well  Mobility performed by Mobility specialist  $Mobility charge 1 Mobility   Pt c/o blurry vision halfway through ambulation, she took off her reading glasses and endorsed instant relief. SpO2 remained >90% on RA and BP was 178/73 after mobility. Pt to recliner after walk, call bell at side.   [name] Mobility Specialist Mobility Specialist Phone: [phone]

## 2020-11-05 NOTE — Care Management Important Message (Signed)
Important Message  Patient Details  Name: Joanna Hood MRN: [id number] Date of Birth: 23-Nov-1945   Medicare Important Message Given:  Yes     Evelen Vazguez Montine Circle 11/05/2020, 4:32 PM

## 2020-11-05 NOTE — Plan of Care (Signed)
  Problem: Education: Goal: Knowledge of General Education information will improve Description: Including pain rating scale, medication(s)/side effects and non-pharmacologic comfort measures Outcome: Progressing   Problem: Health Behavior/Discharge Planning: Goal: Ability to manage health-related needs will improve Outcome: Progressing   Problem: Clinical Measurements: Goal: Ability to maintain clinical measurements within normal limits will improve Outcome: Progressing Goal: Diagnostic test results will improve Outcome: Progressing Goal: Cardiovascular complication will be avoided Outcome: Progressing   Problem: Elimination: Goal: Will not experience complications related to bowel motility Outcome: Progressing   Problem: Education: Goal: Ability to demonstrate management of disease process will improve Outcome: Progressing Goal: Ability to verbalize understanding of medication therapies will improve Outcome: Progressing Goal: Individualized Educational Video(s) Outcome: Progressing   Problem: Activity: Goal: Capacity to carry out activities will improve Outcome: Progressing   Problem: Cardiac: Goal: Ability to achieve and maintain adequate cardiopulmonary perfusion will improve Outcome: Progressing   Problem: Education: Goal: Will demonstrate proper wound care and an understanding of methods to prevent future damage Outcome: Progressing Goal: Knowledge of disease or condition will improve Outcome: Progressing Goal: Knowledge of the prescribed therapeutic regimen will improve Outcome: Progressing Goal: Individualized Educational Video(s) Outcome: Progressing   Problem: Activity: Goal: Risk for activity intolerance will decrease Outcome: Progressing   Problem: Cardiac: Goal: Will achieve and/or maintain hemodynamic stability Outcome: Progressing   Problem: Clinical Measurements: Goal: Postoperative complications will be avoided or minimized Outcome:  Progressing   Problem: Respiratory: Goal: Respiratory status will improve Outcome: Progressing   Problem: Skin Integrity: Goal: Wound healing without signs and symptoms of infection Outcome: Progressing Goal: Risk for impaired skin integrity will decrease Outcome: Progressing   Problem: Urinary Elimination: Goal: Ability to achieve and maintain adequate renal perfusion and functioning will improve Outcome: Progressing

## 2020-11-06 DIAGNOSIS — I051 Rheumatic mitral insufficiency: Secondary | ICD-10-CM | POA: Diagnosis not present

## 2020-11-06 DIAGNOSIS — Z953 Presence of xenogenic heart valve: Secondary | ICD-10-CM | POA: Diagnosis not present

## 2020-11-06 DIAGNOSIS — I5033 Acute on chronic diastolic (congestive) heart failure: Secondary | ICD-10-CM | POA: Diagnosis not present

## 2020-11-06 DIAGNOSIS — I272 Pulmonary hypertension, unspecified: Secondary | ICD-10-CM | POA: Diagnosis not present

## 2020-11-06 LAB — COOXEMETRY PANEL
Carboxyhemoglobin: 1.2 % (ref 0.5–1.5)
Carboxyhemoglobin: 1.2 % (ref 0.5–1.5)
Methemoglobin: 0.7 % (ref 0.0–1.5)
Methemoglobin: 0.6 % (ref 0.0–1.5)
O2 Saturation: 48.4 %
O2 Saturation: 47 %
Total hemoglobin: 9.1 g/dL — ABNORMAL LOW (ref 12.0–16.0)
Total hemoglobin: 9.2 g/dL — ABNORMAL LOW (ref 12.0–16.0)

## 2020-11-06 LAB — BASIC METABOLIC PANEL
Anion gap: 5 (ref 5–15)
BUN: 6 mg/dL — ABNORMAL LOW (ref 8–23)
CO2: 26 mmol/L (ref 22–32)
Calcium: 8.8 mg/dL — ABNORMAL LOW (ref 8.9–10.3)
Chloride: 102 mmol/L (ref 98–111)
Creatinine, Ser: 0.82 mg/dL (ref 0.44–1.00)
GFR, Estimated: >60 mL/min (ref >60)
Glucose, Bld: 89 mg/dL (ref 70–99)
Potassium: 4.3 mmol/L (ref 3.5–5.1)
Sodium: 133 mmol/L — ABNORMAL LOW (ref 135–145)

## 2020-11-06 LAB — PROTIME-INR
INR: 1.9 — ABNORMAL HIGH (ref 0.8–1.2)
Prothrombin Time: 21.4 seconds — ABNORMAL HIGH (ref 11.4–15.2)

## 2020-11-06 NOTE — Progress Notes (Signed)
Mobility Specialist - Progress Note   11/06/20 1341  Mobility  Activity Refused mobility   Pt states within the past hour she experienced an episode of blurry vision while sitting up in her recliner and would like to hold on mobility due to this.   [name] Mobility Specialist Mobility Specialist Phone: [phone]

## 2020-11-06 NOTE — Progress Notes (Addendum)
      [address]       [place] [address]             [phone]      9 Days Post-Op Procedure(s) (LRB): MITRAL VALVE REPLACEMENT (MVR) USING MEDTRONIC MOSAIC VALVE SIZE 29MM (N/A) CLIPPING OF ATRIAL APPENDAGE USING ATRICURE  CLIP SIZE 45MM (N/A) TRANSESOPHAGEAL ECHOCARDIOGRAM (TEE) (N/A) Subjective Feels okay this morning, no complaints  Objective: Vital signs in last 24 hours: Temp:  [97.8 F (36.6 C)-99.3 F (37.4 C)] 98.3 F (36.8 C) (05/18 0336) Pulse Rate:  [59-65] 59 (05/18 0336) Cardiac Rhythm: Atrial fibrillation (05/18 0700) Resp:  [15-20] 17 (05/18 0336) BP: (128-164)/(54-66) 128/61 (05/18 0336) SpO2:  [92 %-99 %] 99 % (05/18 0336) Weight:  [68.8 kg] 68.8 kg (05/18 0402)     Intake/Output from previous day: 05/17 0701 - 05/18 0700 In: 158.9 [I.V.:158.9] Out: 950 [Urine:950] Intake/Output this shift: No intake/output data recorded.  General appearance: alert, cooperative and no distress Heart: regular rate and rhythm, S1, S2 normal, no murmur, click, rub or gallop Lungs: clear to auscultation bilaterally Abdomen: soft, non-tender; bowel sounds normal; no masses,  no organomegaly Extremities: extremities normal, atraumatic, no cyanosis or edema Wound: clean and dry  Lab Results: No results for input(s): WBC, HGB, HCT, PLT in the last 72 hours. BMET: Recent Labs    11/05/20 0500 11/06/20 0343  NA 135 133*  K 4.0 4.3  CL 104 102  CO2 26 26  GLUCOSE 82 89  BUN 6* 6*  CREATININE 0.71 0.82  CALCIUM 8.5* 8.8*    PT/INR:  Recent Labs    11/06/20 0343  LABPROT 21.4*  INR 1.9*   ABG    Component Value Date/Time   PHART 7.357 10/28/2020 2334   HCO3 24.8 10/28/2020 2334   TCO2 26 10/28/2020 2334   ACIDBASEDEF 1.0 10/28/2020 2334   O2SAT 48.4 11/06/2020 0343   CBG (last 3)  No results for input(s): GLUCAP in the last 72 hours.  Assessment/Plan: S/P Procedure(s) (LRB): MITRAL VALVE REPLACEMENT (MVR) USING MEDTRONIC MOSAIC  VALVE SIZE 29MM (N/A) CLIPPING OF ATRIAL APPENDAGE USING ATRICURE  CLIP SIZE 45MM (N/A) TRANSESOPHAGEAL ECHOCARDIOGRAM (TEE) (N/A)  1. CV- stable, off Milrinone, Coox at 48- continue Entresto, Spironolactone, medical optimization per AHF 2. INR 1.9, on coumadin to 4 mg daily 3. Pulm- no acute issues, continue IS 4. Renal-creatinine 0.82, weight is trending down, diuretics per AHF 5.Expected post operative blood loss anemia- Hgb improved on iron   Plan: Repeat coox and possible discharge later today.     LOS: 14 days    Elgie Collard 11/06/2020   I have seen and examined the patient and agree with the assessment and plan as outlined.  Rexene Alberts, MD 11/06/2020 9:36 AM

## 2020-11-06 NOTE — Progress Notes (Signed)
Physical Therapy Treatment Patient Details Name: Joanna Hood MRN: [id number] DOB: 11-10-1945 Today's Date: 11/06/2020    History of Present Illness pt is a 75 y/o female admitted 5/2 for evaluation of progressive dyspnea on exertion and increasing LE edema.  Work up for MVR vs replacement.  5/2 R and Left heart Cath with angiography,   5/5 multiple teeth extractions.  5/9  mitral valve replacement.  PMHx:  tricuspid regurg, RA, pulmonary HTN, ostioporosis, MVR, CHF, persistent Afib.    PT Comments    Pt tolerates treatment well, ambulating for increased distance. Pt demonstrates mild lateral drift with ambulation and will benefit from further assessment of dynamic gait tasks, although declining assessment at this time. Pt is able to recall and implement sternal precautions well during session. Pt will continue to benefit from acute PT POC to aide in a return to independence.  Follow Up Recommendations  Supervision - Intermittent;No PT follow up     Equipment Recommendations  None recommended by PT    Recommendations for Other Services       Precautions / Restrictions Precautions Precautions: Fall;Sternal Precaution Booklet Issued: Yes (comment) Precaution Comments: pt is able to verbalize all sternal precautions with increased time Restrictions Weight Bearing Restrictions:  (sternal precautions)    Mobility  Bed Mobility               General bed mobility comments: received and left sitting in recliner    Transfers Overall transfer level: Independent Equipment used: None Transfers: Sit to/from Stand Sit to Stand: Independent            Ambulation/Gait Ambulation/Gait assistance: Supervision Gait Distance (Feet): 600 Feet Assistive device: None Gait Pattern/deviations: Step-through pattern Gait velocity: functional Gait velocity interpretation: 1.31 - 2.62 ft/sec, indicative of limited community ambulator General Gait Details: pt with slowed  step-through gait, mild lateral drift. Pt declines PT suggestion for dynamic gait assessment   Stairs             Wheelchair Mobility    Modified Rankin (Stroke Patients Only)       Balance Overall balance assessment: Needs assistance Sitting-balance support: No upper extremity supported;Feet supported Sitting balance-Leahy Scale: Good     Standing balance support: No upper extremity supported Standing balance-Leahy Scale: Good                              Cognition Arousal/Alertness: Awake/alert Behavior During Therapy: WFL for tasks assessed/performed Overall Cognitive Status: Within Functional Limits for tasks assessed                                        Exercises      General Comments General comments (skin integrity, edema, etc.): pt reports mild SOB at times with ambulation, SpO2 reading 93% upon completion of walk      Pertinent Vitals/Pain Pain Assessment: No/denies pain    Home Living                      Prior Function            PT Goals (current goals can now be found in the care plan section) Acute Rehab PT Goals Patient Stated Goal: home independent Progress towards PT goals: Progressing toward goals    Frequency    Min 3X/week  PT Plan Current plan remains appropriate    Co-evaluation              AM-PAC PT "6 Clicks" Mobility   Outcome Measure  Help needed turning from your back to your side while in a flat bed without using bedrails?: None Help needed moving from lying on your back to sitting on the side of a flat bed without using bedrails?: None Help needed moving to and from a bed to a chair (including a wheelchair)?: None Help needed standing up from a chair using your arms (e.g., wheelchair or bedside chair)?: None Help needed to walk in hospital room?: A Little Help needed climbing 3-5 steps with a railing? : A Little 6 Click Score: 22    End of Session   Activity  Tolerance: Patient tolerated treatment well Patient left: in chair;with call bell/phone within reach Nurse Communication: Mobility status PT Visit Diagnosis: Other abnormalities of gait and mobility (R26.89)     Time: 7616-0737 PT Time Calculation (min) (ACUTE ONLY): 14 min  Charges:  $Therapeutic Activity: 8-22 mins                     Zenaida Niece, PT, DPT Acute Rehabilitation Pager: [email or web address] 11/06/2020, 11:10 AM

## 2020-11-06 NOTE — Progress Notes (Signed)
Advanced Heart Failure Rounding Note  PCP-Cardiologist: Dr. Gwenlyn Found AHF: Dr. [name]   Patient Profile   [name], followed by Dr. Gwenlyn Found, w/ h/o chronic diastolic heart failure w/ predominant RV dysfunction + pulmonary hypertension, chronic atrial fibrillation on Eliquis, severe mitral regurgitation, systemic HTN and rheumatoid arthritis on Methotrexate, being evaluated for mitral valve repair vs replacement.   Subjective:    5/5 Underwent upper teeth extraction. Given Decadron.  5/6 RHC -> started on milrinone for elevated PA pressures and elevated PVR.  5/9 S/P bioprosthetic MVR Clipping atrial appendage.  5/10 Started on IV lasix. Milrinone cut back to 0.3.  5/11 Milrinone cut back to 0.25 mcg.  5/12 Milrinone reduced to 0.125 mcg  5/14 milrinone increased to 0.25 5/15 Milrinone reduced to 0.125  5/16 Milrinone stopped   Feels good. No SOB. Eager to go home. Early am co-ox 48%. No orthopnea or PND. Weight stable    Objective:   Weight Range: 68.8 kg Body mass index is 26.04 kg/m.   Vital Signs:   Temp:  [97.8 F (36.6 C)-99.3 F (37.4 C)] 98.4 F (36.9 C) (05/18 0742) Pulse Rate:  [59-65] 60 (05/18 0742) Resp:  [13-20] 13 (05/18 0742) BP: (128-159)/(54-67) 159/67 (05/18 0742) SpO2:  [92 %-99 %] 99 % (05/18 0336) Weight:  [68.8 kg] 68.8 kg (05/18 0402) Last BM Date: 11/03/20  Weight change: Filed Weights   11/04/20 0423 11/05/20 0440 11/06/20 0402  Weight: 68 kg 68.3 kg 68.8 kg    Intake/Output:   Intake/Output Summary (Last 24 hours) at 11/06/2020 0821 Last data filed at 11/06/2020 0402 Gross per 24 hour  Intake 158.9 ml  Output 950 ml  Net -791.1 ml      Physical Exam   General:  Sitting in chair. No resp difficulty HEENT: normal Neck: supple. no JVD. Carotids 2+ bilat; no bruits. No lymphadenopathy or thryomegaly appreciated. Cor: Sternal wound ok Regular rate & rhythm. No rubs, gallops or murmurs. Lungs: clear Abdomen: soft, nontender,  nondistended. No hepatosplenomegaly. No bruits or masses. Good bowel sounds. Extremities: no cyanosis, clubbing, rash, edema Neuro: alert & orientedx3, cranial nerves grossly intact. moves all 4 extremities w/o difficulty. Affect pleasant   Telemetry   NSR 60-70s Personally reviewed  Labs    CBC No results for input(s): WBC, NEUTROABS, HGB, HCT, MCV, PLT in the last 72 hours. Basic Metabolic Panel Recent Labs    11/05/20 0500 11/06/20 0343  NA 135 133*  K 4.0 4.3  CL 104 102  CO2 26 26  GLUCOSE 82 89  BUN 6* 6*  CREATININE 0.71 0.82  CALCIUM 8.5* 8.8*   Liver Function Tests No results for input(s): AST, ALT, ALKPHOS, BILITOT, PROT, ALBUMIN in the last 72 hours. No results for input(s): LIPASE, AMYLASE in the last 72 hours. Cardiac Enzymes No results for input(s): CKTOTAL, CKMB, CKMBINDEX, TROPONINI in the last 72 hours.  BNP: BNP (last 3 results) Recent Labs    10/23/20 0006 10/27/20 0155  BNP 490.4* 149.3*    ProBNP (last 3 results) No results for input(s): PROBNP in the last 8760 hours.   D-Dimer No results for input(s): DDIMER in the last 72 hours. Hemoglobin A1C No results for input(s): HGBA1C in the last 72 hours. Fasting Lipid Panel No results for input(s): CHOL, HDL, LDLCALC, TRIG, CHOLHDL, LDLDIRECT in the last 72 hours. Thyroid Function Tests No results for input(s): TSH, T4TOTAL, T3FREE, THYROIDAB in the last 72 hours.  Invalid input(s): FREET3  Other results:  Imaging    No results found.   Medications:     Scheduled Medications: . bisacodyl  10 mg Oral Daily   Or  . bisacodyl  10 mg Rectal Daily  . Chlorhexidine Gluconate Cloth  6 each Topical Daily  . docusate sodium  200 mg Oral BID  . feeding supplement (GLUCERNA SHAKE)  237 mL Oral TID BM  . ferrous [id number] C-folic acid  1 capsule Oral Q breakfast  . furosemide  20 mg Oral Daily  . levothyroxine  125 mcg Oral Q0600  . mouth rinse  15 mL Mouth Rinse BID  .  metoCLOPramide (REGLAN) injection  10 mg Intravenous Q6H  . pantoprazole  40 mg Oral Daily  . polyethylene glycol  17 g Oral Daily  . sacubitril-valsartan  1 tablet Oral BID  . spironolactone  25 mg Oral Daily  . warfarin  4 mg Oral q1600  . Warfarin - Physician Dosing Inpatient   Does not apply q1600    Infusions:   PRN Medications: ondansetron (ZOFRAN) IV, oxyCODONE, sodium chloride flush, sodium chloride flush, traMADol    Assessment/Plan   1. Severe Rheumatic Mitral Valve Regurgitation --> 10/28/20 S/P MVR - Primary MR. LVEF normal 60-65%  - TEE this admit confirmed severe MR. LHC w/ normal coronaries - S/P Bioprosthetic MVR 5/9  - on Coumadin. INR 1.9 - Early am co-ox 48% off milrinone. Will repeat. If co-ox >= 55% think she will be stable for d/c   2. Severe Pulmonary Hypertension  - TTE 8/21 w/ severely elevated RVSP, 78 mmHg. 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 WHO Group 1 2/2 CTD (RA)  - Repeat RHC on 5/6 with improved hemodynamics but PA pressures and PVR still elevated.  - now off milrinone. Plan as above  3. Acute on Chronic Diastolic Heart Failure w/ Predominant RV Dysfunction  - 2D Echo 8/21 showed normal LVEF, 60-65%, w/ mild LVH and G3DD c/w restrictive physiology. RV was mildly reduced at the time - TEE 10/21/20 LVEF normal. RV systolic function is now moderately reduced w/ mild-mod TR, in setting of progressive/ now severe MR. - Co-ox low this am.  - Continue Spiro 25 mg daily  - Continue Entresto 49-51 mg bid - Continue lasix 20 daily - Hold off on b-blocker with low co-ox - Switch lasix to SGLT2i at f/u  4. Chronic Afib: in setting of severe MR w/ severe LAE. - HR controlled.  Started coumadin per Dr Roxy Manns.  - hgb stable 9.1 - INR  1.9  today    5. Rheumatoid Arthritis -  on MTX - followed by rheumatology   6. Hyponatremia - Resolved, Sodium 13 today  Await repeat co-ox. If co-ox >= 55% can send home on  Spiro 25 mg daily  Entresto 49-51 mg bid Lasix 20 daily   Length of Stay: [place], MD  11/06/2020, 8:21 AM  Advanced Heart Failure Team Pager [phone] (M-F; 7a - 5p)  Please contact [facility] for night-coverage after hours (5p -7a ) and weekends on [email or web address]

## 2020-11-07 ENCOUNTER — Other Ambulatory Visit: Payer: Self-pay | Admitting: Physician Assistant

## 2020-11-07 ENCOUNTER — Telehealth: Payer: Self-pay

## 2020-11-07 LAB — BASIC METABOLIC PANEL
Anion gap: 4 — ABNORMAL LOW (ref 5–15)
BUN: 10 mg/dL (ref 8–23)
CO2: 27 mmol/L (ref 22–32)
Calcium: 8.4 mg/dL — ABNORMAL LOW (ref 8.9–10.3)
Chloride: 103 mmol/L (ref 98–111)
Creatinine, Ser: 1.01 mg/dL — ABNORMAL HIGH (ref 0.44–1.00)
GFR, Estimated: 59 mL/min — ABNORMAL LOW (ref >60)
Glucose, Bld: 100 mg/dL — ABNORMAL HIGH (ref 70–99)
Potassium: 4 mmol/L (ref 3.5–5.1)
Sodium: 134 mmol/L — ABNORMAL LOW (ref 135–145)

## 2020-11-07 LAB — PROTIME-INR
INR: 2 — ABNORMAL HIGH (ref 0.8–1.2)
Prothrombin Time: 22.7 seconds — ABNORMAL HIGH (ref 11.4–15.2)

## 2020-11-07 LAB — COOXEMETRY PANEL
Carboxyhemoglobin: 1.3 % (ref 0.5–1.5)
Methemoglobin: 0.6 % (ref 0.0–1.5)
O2 Saturation: 49.7 %
Total hemoglobin: 8.5 g/dL — ABNORMAL LOW (ref 12.0–16.0)

## 2020-11-07 MED ORDER — TRAMADOL HCL 50 MG PO TABS: 50.0000 mg | ORAL_TABLET | Freq: Four times a day (QID) | ORAL | 0 refills | Status: DC | PRN

## 2020-11-07 MED ORDER — SACUBITRIL-VALSARTAN 49-51 MG PO TABS: 1.0000 | ORAL_TABLET | Freq: Two times a day (BID) | ORAL | 3 refills | Status: DC

## 2020-11-07 MED ORDER — FUROSEMIDE 20 MG PO TABS: 20.0000 mg | ORAL_TABLET | Freq: Every day | ORAL | 3 refills | Status: DC

## 2020-11-07 MED ORDER — SPIRONOLACTONE 25 MG PO TABS: 25.0000 mg | ORAL_TABLET | Freq: Every day | ORAL | 3 refills | Status: DC

## 2020-11-07 MED ORDER — WARFARIN SODIUM 4 MG PO TABS: 4.0000 mg | ORAL_TABLET | Freq: Every day | ORAL | 1 refills | Status: DC

## 2020-11-07 NOTE — Telephone Encounter (Signed)
Transition Care Management Unsuccessful Follow-up Telephone Call  Date of discharge and from where:  11/07/2020, Joanna Hood  Attempts:  1st Attempt  Reason for unsuccessful TCM follow-up call:  Left voice message

## 2020-11-07 NOTE — Progress Notes (Signed)
Pt provided with verbal discharge instructions. Daughter at bedside. RN answered all questions. Pt belongings sent with patient. VSS at discharge. PICC removed. Pt discharged via wheelchair to [employer] entrance to private vehicle.

## 2020-11-07 NOTE — Plan of Care (Signed)
  Problem: Education: Goal: Knowledge of General Education information will improve Description: Including pain rating scale, medication(s)/side effects and non-pharmacologic comfort measures Outcome: Adequate for Discharge   Problem: Health Behavior/Discharge Planning: Goal: Ability to manage health-related needs will improve Outcome: Adequate for Discharge   Problem: Clinical Measurements: Goal: Ability to maintain clinical measurements within normal limits will improve Outcome: Adequate for Discharge Goal: Diagnostic test results will improve Outcome: Adequate for Discharge Goal: Cardiovascular complication will be avoided Outcome: Adequate for Discharge   Problem: Elimination: Goal: Will not experience complications related to bowel motility Outcome: Adequate for Discharge   Problem: Education: Goal: Ability to demonstrate management of disease process will improve Outcome: Adequate for Discharge Goal: Ability to verbalize understanding of medication therapies will improve Outcome: Adequate for Discharge Goal: Individualized Educational Video(s) Outcome: Adequate for Discharge   Problem: Activity: Goal: Capacity to carry out activities will improve Outcome: Adequate for Discharge   Problem: Cardiac: Goal: Ability to achieve and maintain adequate cardiopulmonary perfusion will improve Outcome: Adequate for Discharge   Problem: Education: Goal: Will demonstrate proper wound care and an understanding of methods to prevent future damage Outcome: Adequate for Discharge Goal: Knowledge of disease or condition will improve Outcome: Adequate for Discharge Goal: Knowledge of the prescribed therapeutic regimen will improve Outcome: Adequate for Discharge Goal: Individualized Educational Video(s) Outcome: Adequate for Discharge   Problem: Activity: Goal: Risk for activity intolerance will decrease Outcome: Adequate for Discharge   Problem: Cardiac: Goal: Will achieve  and/or maintain hemodynamic stability Outcome: Adequate for Discharge   Problem: Clinical Measurements: Goal: Postoperative complications will be avoided or minimized Outcome: Adequate for Discharge   Problem: Respiratory: Goal: Respiratory status will improve Outcome: Adequate for Discharge   Problem: Skin Integrity: Goal: Wound healing without signs and symptoms of infection Outcome: Adequate for Discharge Goal: Risk for impaired skin integrity will decrease Outcome: Adequate for Discharge   Problem: Urinary Elimination: Goal: Ability to achieve and maintain adequate renal perfusion and functioning will improve Outcome: Adequate for Discharge

## 2020-11-07 NOTE — Progress Notes (Addendum)
Advanced Heart Failure Rounding Note  PCP-Cardiologist: Dr. Gwenlyn Found AHF: Dr. [name]   Patient Profile   [name], followed by Dr. Gwenlyn Found, w/ h/o chronic diastolic heart failure w/ predominant RV dysfunction + pulmonary hypertension, chronic atrial fibrillation on Eliquis, severe mitral regurgitation, systemic HTN and rheumatoid arthritis on Methotrexate, being evaluated for mitral valve repair vs replacement.   Subjective:    5/5 Underwent upper teeth extraction. Given Decadron.  5/6 RHC -> started on milrinone for elevated PA pressures and elevated PVR.  5/9 S/P bioprosthetic MVR Clipping atrial appendage.  5/10 Started on IV lasix. Milrinone cut back to 0.3.  5/11 Milrinone cut back to 0.25 mcg.  5/12 Milrinone reduced to 0.125 mcg  5/14 milrinone increased to 0.25 5/15 Milrinone reduced to 0.125  5/16 Milrinone stopped    Feels good. Wants to go home.    Objective:   Weight Range: 68.8 kg Body mass index is 26.04 kg/m.   Vital Signs:   Temp:  [98.1 F (36.7 C)-98.7 F (37.1 C)] 98.2 F (36.8 C) (05/19 0300) Pulse Rate:  [60-65] 60 (05/18 2300) Resp:  [13-25] 20 (05/19 0300) BP: (136-159)/(60-67) 142/60 (05/19 0300) SpO2:  [92 %-93 %] 93 % (05/19 0300) Weight:  [68.8 kg] 68.8 kg (05/19 0650) Last BM Date: 11/03/20  Weight change: Filed Weights   11/05/20 0440 11/06/20 0402 11/07/20 0650  Weight: 68.3 kg 68.8 kg 68.8 kg    Intake/Output:   Intake/Output Summary (Last 24 hours) at 11/07/2020 0718 Last data filed at 11/07/2020 0500 Gross per 24 hour  Intake 650 ml  Output 1800 ml  Net -1150 ml      Physical Exam   General:  Well appearing. No resp difficulty. Sitting in the chair.  HEENT: normal Neck: supple. no JVD. Carotids 2+ bilat; no bruits. No lymphadenopathy or thryomegaly appreciated. Cor: PMI nondisplaced. Regular rate & rhythm. No rubs, gallops or murmurs. Lungs: clear Abdomen: soft, nontender, nondistended. No hepatosplenomegaly. No  bruits or masses. Good bowel sounds. Extremities: no cyanosis, clubbing, rash, edema. RUE PICC  Neuro: alert & orientedx3, cranial nerves grossly intact. moves all 4 extremities w/o difficulty. Affect pleasant   Telemetry   SR 60-70s   Labs    CBC No results for input(s): WBC, NEUTROABS, HGB, HCT, MCV, PLT in the last 72 hours. Basic Metabolic Panel Recent Labs    11/06/20 0343 11/07/20 0500  NA 133* 134*  K 4.3 4.0  CL 102 103  CO2 26 27  GLUCOSE 89 100*  BUN 6* 10  CREATININE 0.82 1.01*  CALCIUM 8.8* 8.4*   Liver Function Tests No results for input(s): AST, ALT, ALKPHOS, BILITOT, PROT, ALBUMIN in the last 72 hours. No results for input(s): LIPASE, AMYLASE in the last 72 hours. Cardiac Enzymes No results for input(s): CKTOTAL, CKMB, CKMBINDEX, TROPONINI in the last 72 hours.  BNP: BNP (last 3 results) Recent Labs    10/23/20 0006 10/27/20 0155  BNP 490.4* 149.3*    ProBNP (last 3 results) No results for input(s): PROBNP in the last 8760 hours.   D-Dimer No results for input(s): DDIMER in the last 72 hours. Hemoglobin A1C No results for input(s): HGBA1C in the last 72 hours. Fasting Lipid Panel No results for input(s): CHOL, HDL, LDLCALC, TRIG, CHOLHDL, LDLDIRECT in the last 72 hours. Thyroid Function Tests No results for input(s): TSH, T4TOTAL, T3FREE, THYROIDAB in the last 72 hours.  Invalid input(s): FREET3  Other results:   Imaging    No results  found.   Medications:     Scheduled Medications: . bisacodyl  10 mg Oral Daily   Or  . bisacodyl  10 mg Rectal Daily  . Chlorhexidine Gluconate Cloth  6 each Topical Daily  . docusate sodium  200 mg Oral BID  . feeding supplement (GLUCERNA SHAKE)  237 mL Oral TID BM  . ferrous [id number] C-folic acid  1 capsule Oral Q breakfast  . levothyroxine  125 mcg Oral Q0600  . mouth rinse  15 mL Mouth Rinse BID  . metoCLOPramide (REGLAN) injection  10 mg Intravenous Q6H  . pantoprazole  40 mg  Oral Daily  . polyethylene glycol  17 g Oral Daily  . sacubitril-valsartan  1 tablet Oral BID  . spironolactone  25 mg Oral Daily  . warfarin  4 mg Oral q1600  . Warfarin - Physician Dosing Inpatient   Does not apply q1600    Infusions:   PRN Medications: ondansetron (ZOFRAN) IV, oxyCODONE, sodium chloride flush, sodium chloride flush, traMADol    Assessment/Plan   1. Severe Rheumatic Mitral Valve Regurgitation --> 10/28/20 S/P MVR - Primary MR. LVEF normal 60-65%  - TEE this admit confirmed severe MR. LHC w/ normal coronaries - S/P Bioprosthetic MVR 5/9  - on Coumadin. INR 2 - CO-OX 50%.  2. Severe Pulmonary Hypertension  - TTE 8/21 w/ severely elevated RVSP, 78 mmHg. 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 WHO Group 1 2/2 CTD (RA)  - Repeat RHC on 5/6 with improved hemodynamics but PA pressures and PVR still elevated.  - now off milrinone. Plan as above  3. Acute on Chronic Diastolic Heart Failure w/ Predominant RV Dysfunction  - 2D Echo 8/21 showed normal LVEF, 60-65%, w/ mild LVH and G3DD c/w restrictive physiology. RV was mildly reduced at the time - TEE 10/21/20 LVEF normal. RV systolic function is now moderately reduced w/ mild-mod TR, in setting of progressive/ now severe MR. - Co-ox 50% lower CO-OX may be from anemia.  - Volume status stable.  - Continue Spiro 25 mg daily  - Continue Entresto 49-51 mg bid - Continue lasix 20 daily - Hold off on b-blocker with low co-ox - Switch lasix to SGLT2i at f/u  4. Chronic Afib: in setting of severe MR w/ severe LAE. - HR controlled.  Started coumadin per Dr Roxy Manns.  - hgb 8.5  - INR  2  today   5. Rheumatoid Arthritis - on MTX - followed by rheumatology   6. Hyponatremia - Resolved, Sodium 134  today  Discussed with Dr [name] ok for d/c  today. Will need PICC removed prior to discharge.   Spiro 25 mg daily  Entresto 49-51 mg bid Lasix 20 daily   Length of Stay: [place], NP  11/07/2020, 7:18 AM  Advanced Heart Failure Team Pager [phone] (M-F; 7a - 5p)  Please contact [facility] for night-coverage after hours (5p -7a ) and weekends on [email or web address]  Patient seen and examined with the above-signed Advanced Practice Provider and/or Housestaff. I personally reviewed laboratory data, imaging studies and relevant notes. I independently examined the patient and formulated the important aspects of the plan. I have edited the note to reflect any of my changes or salient points. I have personally discussed the plan with the patient and/or family.  Feels good. Remains in NSR. Co-ox remains marginal. Wants to go home.   General:  Well appearing. No resp difficulty HEENT: normal Neck: supple. no JVD. Carotids 2+ bilat; no bruits. No lymphadenopathy or thryomegaly appreciated. Cor: PMI nondisplaced. Regular rate & rhythm. No rubs, gallops or murmurs. Lungs: clear Abdomen: soft, nontender, nondistended. No hepatosplenomegaly. No bruits or masses. Good bowel sounds. Extremities: no cyanosis, clubbing, rash, edema Neuro: alert & orientedx3, cranial nerves grossly intact. moves all 4 extremities w/o difficulty. Affect pleasant  Co-ox remains marginal but otherwise doing great. Labs ok. Ok to go home today with close f/u in HF Clinic.   Glori Bickers, MD  8:55 AM

## 2020-11-07 NOTE — Progress Notes (Addendum)
      [address]       [place] [address]             [phone]      10 Days Post-Op Procedure(s) (LRB): MITRAL VALVE REPLACEMENT (MVR) USING MEDTRONIC MOSAIC VALVE SIZE 29MM (N/A) CLIPPING OF ATRIAL APPENDAGE USING ATRICURE  CLIP SIZE 45MM (N/A) TRANSESOPHAGEAL ECHOCARDIOGRAM (TEE) (N/A)   Subjective:  Patient sitting up in chair.  She has no complaints, states she is feeling good.  Objective: Vital signs in last 24 hours: Temp:  [98.1 F (36.7 C)-98.7 F (37.1 C)] 98.2 F (36.8 C) (05/19 0300) Pulse Rate:  [60-65] 60 (05/18 2300) Cardiac Rhythm: Atrial flutter;Bundle branch block (05/19 0704) Resp:  [13-25] 20 (05/19 0300) BP: (136-159)/(60-67) 142/60 (05/19 0300) SpO2:  [92 %-93 %] 93 % (05/19 0300) Weight:  [68.8 kg] 68.8 kg (05/19 0650)  Intake/Output from previous day: 05/18 0701 - 05/19 0700 In: 650 [P.O.:650] Out: 1800 [Urine:1800]  General appearance: alert, cooperative and no distress Heart: regular rate and rhythm Lungs: clear to auscultation bilaterally Abdomen: soft, non-tender; bowel sounds normal; no masses,  no organomegaly Extremities: edema trace Wound: clean and dry  Lab Results: No results for input(s): WBC, HGB, HCT, PLT in the last 72 hours. BMET: Recent Labs    11/06/20 0343 11/07/20 0500  NA 133* 134*  K 4.3 4.0  CL 102 103  CO2 26 27  GLUCOSE 89 100*  BUN 6* 10  CREATININE 0.82 1.01*  CALCIUM 8.8* 8.4*    PT/INR:  Recent Labs    11/07/20 0500  LABPROT 22.7*  INR 2.0*   ABG    Component Value Date/Time   PHART 7.357 10/28/2020 2334   HCO3 24.8 10/28/2020 2334   TCO2 26 10/28/2020 2334   ACIDBASEDEF 1.0 10/28/2020 2334   O2SAT 49.7 11/07/2020 0454   CBG (last 3)  No results for input(s): GLUCAP in the last 72 hours.  Assessment/Plan: S/P Procedure(s) (LRB): MITRAL VALVE REPLACEMENT (MVR) USING MEDTRONIC MOSAIC VALVE SIZE 29MM (N/A) CLIPPING OF ATRIAL APPENDAGE USING ATRICURE  CLIP SIZE 45MM  (N/A) TRANSESOPHAGEAL ECHOCARDIOGRAM (TEE) (N/A)  1. CV- NSR, Coox- improved to 49- on Entresto, Spironolactone- management per AHF 2. INR 2.0, will continue coumadin at 4 mg daily 3. Pulm- off oxygen, continue IS 4. Renal- creatinine relatively stable at 1.01, not currently on Lasix 5. Dispo- patient stable, continue Entresto, Spironolactone, INR at 2.0 continue coumadin at current regimen, Co-ox up to 49 this morning, will await AHF followup and discharge once they feel patient is safe and medically optimized   LOS: 15 days    Ellwood Handler, PA-C 11/07/2020   I have seen and examined the patient and agree with the assessment and plan as outlined.  I think she is ready for hospital d/c  Rexene Alberts, MD 11/07/2020 7:49 AM

## 2020-11-07 NOTE — Telephone Encounter (Signed)
Transition Care Management Unsuccessful Follow-up Telephone Call  Date of discharge and from where:  [facility]   Attempts:  2nd Attempt  Reason for unsuccessful TCM follow-up call:  No answer/busy

## 2020-11-08 ENCOUNTER — Other Ambulatory Visit: Payer: Self-pay

## 2020-11-08 DIAGNOSIS — I4819 Other persistent atrial fibrillation: Secondary | ICD-10-CM

## 2020-11-08 DIAGNOSIS — Z953 Presence of xenogenic heart valve: Secondary | ICD-10-CM

## 2020-11-08 NOTE — Telephone Encounter (Signed)
Transition Care Management Follow-up Telephone Call  Date of discharge and from where: [facility]  How have you been since you were released from the hospital? Patient is doing better. Resting at home comfortably.  Any questions or concerns? No  Items Reviewed:  Did the pt receive and understand the discharge instructions provided? Yes   Medications obtained and verified? Yes   Other? No   Any new allergies since your discharge? No   Dietary orders reviewed? Yes  Do you have support at home? Yes   Home Care and Equipment/Supplies: Were home health services ordered? not applicable If so, what is the name of the agency? N/A  Has the agency set up a time to come to the patient's home? not applicable Were any new equipment or medical supplies ordered?  No What is the name of the medical supply agency? N/A Were you able to get the supplies/equipment? not applicable Do you have any questions related to the use of the equipment or supplies? No  Functional Questionnaire: (I = Independent and D = Dependent) ADLs: I  Bathing/Dressing- I  Meal Prep- I  Eating- I  Maintaining continence- I  Transferring/Ambulation- I  Managing Meds- I  Follow up appointments reviewed:   PCP Hospital f/u appt confirmed? Yes  Scheduled to see Dr. Danise Mina on 11/12/2020 @ 11 am.  [facility] f/u appt confirmed? Yes  Scheduled to see neurology.  Are transportation arrangements needed? No   If their condition worsens, is the pt aware to call PCP or go to the Emergency Dept.? Yes  Was the patient provided with contact information for the PCP's office or ED? Yes  Was to pt encouraged to call back with questions or concerns? Yes

## 2020-11-11 ENCOUNTER — Telehealth: Payer: Self-pay | Admitting: Family Medicine

## 2020-11-11 ENCOUNTER — Ambulatory Visit (INDEPENDENT_AMBULATORY_CARE_PROVIDER_SITE_OTHER): Payer: Medicare HMO

## 2020-11-11 ENCOUNTER — Other Ambulatory Visit: Payer: Self-pay

## 2020-11-11 DIAGNOSIS — Z7901 Long term (current) use of anticoagulants: Secondary | ICD-10-CM | POA: Insufficient documentation

## 2020-11-11 DIAGNOSIS — Z953 Presence of xenogenic heart valve: Secondary | ICD-10-CM

## 2020-11-11 DIAGNOSIS — I4819 Other persistent atrial fibrillation: Secondary | ICD-10-CM

## 2020-11-11 LAB — POCT INR: INR: 2.4 (ref 2.0–3.0)

## 2020-11-11 NOTE — Telephone Encounter (Signed)
Called pt for a reminder of her appt.  Pt told me she got out of hospital on Thursday and she had a cough for a couple days. Pt has no other symptoms.They tested her at the hospital and she was negative.

## 2020-11-11 NOTE — Patient Instructions (Signed)
Take 1 tablet Daily.  Recheck 1 week.  A full discussion of the nature of anticoagulants has been carried out.  A benefit risk analysis has been presented to the patient, so that they understand the justification for choosing anticoagulation at this time. The need for frequent and regular monitoring, precise dosage adjustment and compliance is stressed.  Side effects of potential bleeding are discussed.  The patient should avoid any OTC items containing aspirin or ibuprofen, and should avoid great swings in general diet.  Avoid alcohol consumption.  Call if any signs of abnormal bleeding.  [phone]

## 2020-11-11 NOTE — Telephone Encounter (Signed)
Noted.  Ok to keep OV.

## 2020-11-12 ENCOUNTER — Other Ambulatory Visit: Payer: Self-pay

## 2020-11-12 ENCOUNTER — Encounter: Payer: Self-pay | Admitting: Family Medicine

## 2020-11-12 ENCOUNTER — Ambulatory Visit (INDEPENDENT_AMBULATORY_CARE_PROVIDER_SITE_OTHER): Payer: Medicare HMO | Admitting: Family Medicine

## 2020-11-12 VITALS — BP 156/80 | HR 57 | Temp 97.5°F | Ht 64.0 in | Wt 147.0 lb

## 2020-11-12 DIAGNOSIS — D509 Iron deficiency anemia, unspecified: Secondary | ICD-10-CM

## 2020-11-12 DIAGNOSIS — N289 Disorder of kidney and ureter, unspecified: Secondary | ICD-10-CM

## 2020-11-12 DIAGNOSIS — Z7901 Long term (current) use of anticoagulants: Secondary | ICD-10-CM

## 2020-11-12 DIAGNOSIS — Z953 Presence of xenogenic heart valve: Secondary | ICD-10-CM

## 2020-11-12 DIAGNOSIS — I10 Essential (primary) hypertension: Secondary | ICD-10-CM

## 2020-11-12 DIAGNOSIS — M0579 Rheumatoid arthritis with rheumatoid factor of multiple sites without organ or systems involvement: Secondary | ICD-10-CM | POA: Diagnosis not present

## 2020-11-12 DIAGNOSIS — I4819 Other persistent atrial fibrillation: Secondary | ICD-10-CM | POA: Diagnosis not present

## 2020-11-12 DIAGNOSIS — R0609 Other forms of dyspnea: Secondary | ICD-10-CM

## 2020-11-12 DIAGNOSIS — R0600 Dyspnea, unspecified: Secondary | ICD-10-CM

## 2020-11-12 DIAGNOSIS — I272 Pulmonary hypertension, unspecified: Secondary | ICD-10-CM

## 2020-11-12 DIAGNOSIS — R634 Abnormal weight loss: Secondary | ICD-10-CM | POA: Diagnosis not present

## 2020-11-12 DIAGNOSIS — I5032 Chronic diastolic (congestive) heart failure: Secondary | ICD-10-CM

## 2020-11-12 LAB — RENAL FUNCTION PANEL
Albumin: 3.5 g/dL (ref 3.5–5.2)
BUN: 10 mg/dL (ref 6–23)
CO2: 28 mEq/L (ref 19–32)
Calcium: 9.7 mg/dL (ref 8.4–10.5)
Chloride: 99 mEq/L (ref 96–112)
Creatinine, Ser: 0.77 mg/dL (ref 0.40–1.20)
GFR: 76.51 mL/min (ref >60.00)
Glucose, Bld: 94 mg/dL (ref 70–99)
Phosphorus: 3.8 mg/dL (ref 2.3–4.6)
Potassium: 3.9 mEq/L (ref 3.5–5.1)
Sodium: 135 mEq/L (ref 135–145)

## 2020-11-12 LAB — CBC WITH DIFFERENTIAL/PLATELET
Basophils Absolute: 0.1 10*3/uL (ref 0.0–0.1)
Basophils Relative: 1.6 % (ref 0.0–3.0)
Eosinophils Absolute: 0.2 10*3/uL (ref 0.0–0.7)
Eosinophils Relative: 2.8 % (ref 0.0–5.0)
HCT: 29.8 % — ABNORMAL LOW (ref 36.0–46.0)
Hemoglobin: 9.7 g/dL — ABNORMAL LOW (ref 12.0–15.0)
Lymphocytes Relative: 12.2 % (ref 12.0–46.0)
Lymphs Abs: 0.8 10*3/uL (ref 0.7–4.0)
MCHC: 32.5 g/dL (ref 30.0–36.0)
MCV: 84.7 fl (ref 78.0–100.0)
Monocytes Absolute: 0.8 10*3/uL (ref 0.1–1.0)
Monocytes Relative: 11.4 % (ref 3.0–12.0)
Neutro Abs: 4.8 10*3/uL (ref 1.4–7.7)
Neutrophils Relative %: 72 % (ref 43.0–77.0)
Platelets: 290 10*3/uL (ref 150.0–400.0)
RBC: 3.52 Mil/uL — ABNORMAL LOW (ref 3.87–5.11)
RDW: 17.1 % — ABNORMAL HIGH (ref 11.5–15.5)
WBC: 6.6 10*3/uL (ref 4.0–10.5)

## 2020-11-12 NOTE — Patient Instructions (Addendum)
Labs today I'm glad you're recovering so well!  Blood pressures are staying elevated - start monitoring at home.

## 2020-11-12 NOTE — Progress Notes (Signed)
Patient ID: Joanna Hood, female    DOB: 1945/12/21, 75 y.o.   MRN: [id number]  This visit was conducted in person.  BP (!) 156/80   Pulse (!) 57   Temp (!) 97.5 F (36.4 C) (Temporal)   Ht 5\' 4"  (1.626 m)   Wt 147 lb (66.7 kg)   SpO2 96%   BMI 25.23 kg/m   168/80 on repeat testing  BP Readings from Last 3 Encounters:  11/12/20 (!) 156/80  11/07/20 (!) 152/60  10/09/20 (!) 150/70    CC: hosp f/u visit - TCM visit not done as she has cardiology f/u planned next week Subjective:   HPI: Joanna Hood is a 75 y.o. female presenting on 11/12/2020 for Hospitalization Follow-up (Admitted on 10/21/20 to [facility], dx nonrheumatic mitrial valve regurgitation.  Pt accompanied by daughter, Joanna Hood- temp 97.9.)   Recent hospitalization for MV replacement Roxy Manns) for known severe MR associated with severely dilated LA with elevated RV systolic pressures - chronic HFpEF with RV dysfunction and pulmonary hypertension as well as chronic atrial fibrillation previously on Eliquis. Prior to surgery she was noting increased dyspnea on exertion and pedal edema. She underwent Mitral Valve Replacement with a 29 Edwards Mosaic Bioprosthetic Valve on 10/28/2020. She also had ppx clipping of left atrial appendage. Prior to surgery she had LHC with normal coronary arteries. Eliquis was transitioned to coumadin (4mg  daily). Started on spironolactone and entresto, low dose lasix continued. She did have dental extractions prior to surgery.   While hospitalized did have acute on chronic CHF exacerbation treated with aggressive diuresis.   Latest INR 2.4 yesterday.   Since home no chest pain, dyspnea, leg swelling, dizziness or fevers/chills.  30+ lb weight loss over last 8 months, unintentional, however assoicated with marked diuresis of 17 lbs since last seen here.  Appetite is picking up - continues 3 meals a day with snacks.  Goal dry weight is mid 140s.    Admit date: 10/21/2020 Discharge date:  11/07/2020 TCM hosp f/u phone call completed 11/08/2020.   Preoperative Diagnosis:  Severe Mitral Regurgitation  PermanentAtrial Fibrillation Postoperative Diagnosis:Same Procedure:  Mitral Valve Replacement Medtronic Mosaic Stented PorcineBioprosthetic Tissue Valve (size 18mm, model #310, serial #[id number]) Clipping of left atrial appendage (Atricure Pro245 left atrial clip,size 26mm Surgeon:Clarence H. Roxy Manns, MD Assistant:Erin Barrett, PA-C     Relevant past medical, surgical, family and social history reviewed and updated as indicated. Interim medical history since our last visit reviewed. Allergies and medications reviewed and updated. Outpatient Medications Prior to Visit  Medication Sig Dispense Refill  . acetaminophen (TYLENOL) 325 MG tablet Take 650 mg by mouth every 6 (six) hours as needed (pain).    Marland Kitchen aspirin EC 81 MG tablet Take 81 mg by mouth once. Swallow whole.    . Calcium Carb-Cholecalciferol (CALCIUM-VITAMIN D) 600-400 MG-UNIT TABS Take 1 tablet by mouth in the morning.    . cholecalciferol (VITAMIN D) 1000 UNITS tablet Take 1,000 Units by mouth in the morning.    . fluticasone (FLONASE) 50 MCG/ACT nasal spray Place 2 sprays into both nostrils daily. (Patient taking differently: Place 2 sprays into both nostrils daily as needed for allergies.) 16 g 0  . furosemide (LASIX) 20 MG tablet Take 1 tablet (20 mg total) by mouth daily. 30 tablet 3  . levothyroxine (SYNTHROID) 125 MCG tablet Take 1 tablet (125 mcg total) by mouth daily before breakfast. 90 tablet 3  . methotrexate (RHEUMATREX) 2.5 MG tablet Take 15 mg by mouth every  Friday. Caution:Chemotherapy. Protect from light.    . sacubitril-valsartan (ENTRESTO) 49-51 MG Take 1 tablet by mouth 2 (two) times daily. 60 tablet 3  . spironolactone (ALDACTONE) 25 MG tablet Take 1 tablet (25 mg total) by mouth daily. 30 tablet 3  . traMADol (ULTRAM) 50 MG tablet Take 1  tablet (50 mg total) by mouth every 6 (six) hours as needed for moderate pain. 30 tablet 0  . warfarin (COUMADIN) 4 MG tablet Take 1 tablet (4 mg total) by mouth daily at 4 PM. 30 tablet 1   No facility-administered medications prior to visit.     Per HPI unless specifically indicated in ROS section below Review of Systems Objective:  BP (!) 156/80   Pulse (!) 57   Temp (!) 97.5 F (36.4 C) (Temporal)   Ht 5\' 4"  (1.626 m)   Wt 147 lb (66.7 kg)   SpO2 96%   BMI 25.23 kg/m   Wt Readings from Last 3 Encounters:  11/12/20 147 lb (66.7 kg)  11/07/20 151 lb 10.8 oz (68.8 kg)  10/09/20 161 lb (73 kg)      Physical Exam Vitals and nursing note reviewed.  Constitutional:      Appearance: Normal appearance. She is not ill-appearing.     Comments: Sitting in wheelchair  Cardiovascular:     Rate and Rhythm: Normal rate and regular rhythm.     Pulses: Normal pulses.     Heart sounds: Murmur (3/6 systolic) heard.    Pulmonary:     Effort: Pulmonary effort is normal. No respiratory distress.     Breath sounds: Normal breath sounds. No wheezing, rhonchi or rales.  Musculoskeletal:     Right lower leg: No edema.     Left lower leg: No edema.  Skin:    General: Skin is warm and dry.     Findings: No rash.  Neurological:     Mental Status: She is alert.  Psychiatric:        Mood and Affect: Mood normal.        Behavior: Behavior normal.       Results for orders placed or performed in visit on 11/12/20  Renal function panel  Result Value Ref Range   Sodium 135 135 - 145 mEq/L   Potassium 3.9 3.5 - 5.1 mEq/L   Chloride 99 96 - 112 mEq/L   CO2 28 19 - 32 mEq/L   Albumin 3.5 3.5 - 5.2 g/dL   BUN 10 6 - 23 mg/dL   Creatinine, Ser 0.77 0.40 - 1.20 mg/dL   Glucose, Bld 94 70 - 99 mg/dL   Phosphorus 3.8 2.3 - 4.6 mg/dL   GFR 76.51 >60.00 mL/min   Calcium 9.7 8.4 - 10.5 mg/dL  CBC with Differential/Platelet  Result Value Ref Range   WBC 6.6 4.0 - 10.5 K/uL   RBC 3.52 (L) 3.87  - 5.11 Mil/uL   Hemoglobin 9.7 (L) 12.0 - 15.0 g/dL   HCT 29.8 (L) 36.0 - 46.0 %   MCV 84.7 78.0 - 100.0 fl   MCHC 32.5 30.0 - 36.0 g/dL   RDW 17.1 (H) 11.5 - 15.5 %   Platelets 290.0 150.0 - 400.0 K/uL   Neutrophils Relative % 72.0 43.0 - 77.0 %   Lymphocytes Relative 12.2 12.0 - 46.0 %   Monocytes Relative 11.4 3.0 - 12.0 %   Eosinophils Relative 2.8 0.0 - 5.0 %   Basophils Relative 1.6 0.0 - 3.0 %   Neutro Abs 4.8 1.4 -  7.7 K/uL   Lymphs Abs 0.8 0.7 - 4.0 K/uL   Monocytes Absolute 0.8 0.1 - 1.0 K/uL   Eosinophils Absolute 0.2 0.0 - 0.7 K/uL   Basophils Absolute 0.1 0.0 - 0.1 K/uL    Assessment & Plan:  This visit occurred during the SARS-CoV-2 public health emergency.  Safety protocols were in place, including screening questions prior to the visit, additional usage of staff PPE, and extensive cleaning of exam room while observing appropriate contact time as indicated for disinfecting solutions.   Problem List Items Addressed This Visit    Pulmonary hypertension ([place]) (Chronic)   Persistent atrial fibrillation (HCC)    eliquis transitioned to coumadin.  Off beta blocker.       HTN (hypertension), malignant    BP remaining elevated despite current regimen of entresto 49/51 bid, spironolactone 25mg  daily, and lasix 20mg  daily.  Avoid beta blocker as already bradycardic, consider CCB in with preserved EF.  No changes made today given she has short term CHF clinic appt, but I did ask her to start tracking BP readings at home to bring log to her upcoming cardiology appointment.       Microcytic anemia    Update CBC after recent open heart surgery.  H/o chronic normocytic anemia, baseline was previously around 11 g/dL. Will see if we can add B12 folate and iron panel to blood in lab (in chronic MTX use).       Relevant Medications   folic acid (FOLVITE) 1 MG tablet   Rheumatoid arthritis ([place])    Followed by rheum Trudie Reed) on weekly MTX.  Will need to verify she continues  folate 1mg  daily.       Unintentional weight loss    Ongoing in setting of recent diuresis. Goal dry weight is mid 140lbs. Continue to monitor, advised let me know if drops into 130s.       Renal insufficiency    This is better after mitral valve replacement. Recheck today.      Dyspnea    Improved after MV replacement.       Chronic heart failure with preserved ejection fraction (HFpEF) ([place])    Appreciate CHF clinic care.  Has been diuresed recently, new goal dry weight mid 140s.       S/P mitral valve replacement with bioprosthetic valve - Primary    Recent mitral valve replacement with bioprosthetic tissue valve, now on coumadin with goal INR range 2-3. Recovering markedly well after recent heart surgery, already has f/u with cardiology, CHF clinic, and thoracic surgery planned.       Relevant Orders   Renal function panel (Completed)   CBC with Differential/Platelet (Completed)   Long term (current) use of anticoagulants    Now followed by cardiology coumadin clinic          Meds ordered this encounter  Medications  . folic acid (FOLVITE) 1 MG tablet    Sig: Take 1 tablet (1 mg total) by mouth daily.   Orders Placed This Encounter  Procedures  . Renal function panel  . CBC with Differential/Platelet    Patient Instructions  Labs today I'm glad you're recovering so well!  Blood pressures are staying elevated - start monitoring at home.    Follow up plan: Return if symptoms worsen or fail to improve.  Ria Bush, MD

## 2020-11-13 ENCOUNTER — Other Ambulatory Visit: Payer: Self-pay | Admitting: Cardiovascular Disease

## 2020-11-14 ENCOUNTER — Other Ambulatory Visit (INDEPENDENT_AMBULATORY_CARE_PROVIDER_SITE_OTHER): Payer: Medicare HMO

## 2020-11-14 ENCOUNTER — Telehealth (HOSPITAL_COMMUNITY): Payer: Self-pay

## 2020-11-14 DIAGNOSIS — D509 Iron deficiency anemia, unspecified: Secondary | ICD-10-CM

## 2020-11-14 LAB — FOLATE: Folate: 7.2 ng/mL (ref >5.9)

## 2020-11-14 LAB — IBC + FERRITIN
Ferritin: 368.2 ng/mL — ABNORMAL HIGH (ref 10.0–291.0)
Iron: 23 ug/dL — ABNORMAL LOW (ref 42–145)
Saturation Ratios: 7.4 % — ABNORMAL LOW (ref 20.0–50.0)
Transferrin: 221 mg/dL (ref 212.0–360.0)

## 2020-11-14 MED ORDER — FOLIC ACID 1 MG PO TABS: 1.0000 mg | ORAL_TABLET | Freq: Every day | ORAL | Status: DC

## 2020-11-14 NOTE — Telephone Encounter (Signed)
Called patient to see if she is interested in the Cardiac Rehab Program. Patient expressed interest. Explained scheduling process and went over insurance, patient verbalized understanding. Will contact patient for scheduling once f/u has been completed. 

## 2020-11-14 NOTE — Assessment & Plan Note (Addendum)
Update CBC after recent open heart surgery.  H/o chronic normocytic anemia, baseline was previously around 11 g/dL. Will see if we can add B12 folate and iron panel to blood in lab (in chronic MTX use).

## 2020-11-14 NOTE — Assessment & Plan Note (Signed)
eliquis transitioned to coumadin.  Off beta blocker.

## 2020-11-14 NOTE — Assessment & Plan Note (Signed)
BP remaining elevated despite current regimen of entresto 49/51 bid, spironolactone 25mg  daily, and lasix 20mg  daily.  Avoid beta blocker as already bradycardic, consider CCB in with preserved EF.  No changes made today given she has short term CHF clinic appt, but I did ask her to start tracking BP readings at home to bring log to her upcoming cardiology appointment.

## 2020-11-14 NOTE — Assessment & Plan Note (Addendum)
This is better after mitral valve replacement. Recheck today.

## 2020-11-14 NOTE — Assessment & Plan Note (Addendum)
Now followed by cardiology coumadin clinic

## 2020-11-14 NOTE — Assessment & Plan Note (Addendum)
Appreciate CHF clinic care.  Has been diuresed recently, new goal dry weight mid 140s.

## 2020-11-14 NOTE — Assessment & Plan Note (Signed)
Improved after MV replacement.

## 2020-11-14 NOTE — Assessment & Plan Note (Addendum)
Followed by rheum Trudie Reed) on weekly MTX.  Will need to verify she continues folate 1mg  daily.

## 2020-11-14 NOTE — Telephone Encounter (Signed)
Pt insurance is active and benefits verified through [facility]. Co-pay $10.00, DED $0.00/$0.00 met, out of pocket $3,900.00/$60.00 met, co-insurance 0%. No pre-authorization required. Passport, 11/14/20 _0 :56PM, REF#[id number]  Will contact patient to see if she is interested in the Cardiac Rehab Program. If interested, patient will need to complete follow up appt. Once completed, patient will be contacted for scheduling upon review by the RN Navigator.

## 2020-11-14 NOTE — Assessment & Plan Note (Addendum)
Ongoing in setting of recent diuresis. Goal dry weight is mid 140lbs. Continue to monitor, advised let me know if drops into 130s.

## 2020-11-14 NOTE — Assessment & Plan Note (Addendum)
Recent mitral valve replacement with bioprosthetic tissue valve, now on coumadin with goal INR range 2-3. Recovering markedly well after recent heart surgery, already has f/u with cardiology, CHF clinic, and thoracic surgery planned.

## 2020-11-16 ENCOUNTER — Other Ambulatory Visit: Payer: Self-pay | Admitting: Family Medicine

## 2020-11-16 MED ORDER — IRON (FERROUS SULFATE) 325 (65 FE) MG PO TABS: 325.0000 mg | ORAL_TABLET | ORAL | Status: DC

## 2020-11-16 NOTE — Telephone Encounter (Signed)
Opened in error

## 2020-11-18 NOTE — Progress Notes (Signed)
PCP: Dr Leo Grosser Primary Cardiologist: Dr Gwenlyn Found  HF MD: Dr [name]   HPI: [name], followed by Dr. Gwenlyn Found, w/ h/o chronic diastolic heart failure w/ predominant RV dysfunction + pulmonary hypertension, chronic atrial fibrillation on Eliquis,severe mitral regurgitation,systemic HTNandrheumatoid arthritis on Methotrexate, and  5/9 bioprosthetic MVR Clipping atrial appendage.  Had Echo 8/21 showed normal LVEF, 60-65%, w/ mild LVH and G3DD c/w restrictive physiology. RV was mildly reduced at the time w/ severely elevated pulmonary artery pressure at 78 mmHg. LA size was severely dilated, RA mildly dilated. Also w/ findings c/w severe MR.  Developed increased shortness of breath and was set up for TEE/cath to further evaluate mitral valve. Admitted 10/21/2020 for valve work up. TEE with severe MR. LHC with normal cors and RHC with elevated PA pressures/PVR. CT surgery deemed appropriate for MVR. Optimized with milrinone and on 10/28/20 underwent bioprosthetic MVR and clipping atrial  appendage. Milrinone gradually weaned off. Started on GDMT but no bb due to low CO-OX. Discharged on 11/07/20  Today she returns for post hospital follow up with her daughter. Overall feeling fine. Denies SOB/PND/Orthopnea. Able to perform ADLs. Needs minimal assistance to dress. Appetite ok. No fever or chills. Weight at home trending down 148-->142 pounds. Taking all medications.  Cardiac Test   10/25/20 RHC -> started on milrinone for elevated PA pressures and elevated PVR.  10/21/20 RHC/LHC normal cors,  10/28/20 S/P bioprosthetic MVR Clipping atrial appendage.   10/28/20 Introp ECHO  Left Ventricle: has mildly reduced systolic function, with an ejection fraction of 50%. The cavity size was normal. The wall motion is normal. . Right Ventricle: The right ventricle appears unchanged from pre-bypass. . Aorta: The aorta appears unchanged from pre-bypass. . Left Atrial Appendage: The left atrial appendage appears  unchanged from pre-bypass. . Aortic Valve: The aortic valve appears unchanged from pre-bypass. . Mitral Valve: No stenosis present. A bioprosthetic bioprosthetic valve was placed, leaflets are not freely mobile and leaflets thin Manufactured by; Medtronic Size; 29 mm . No regurgitation post repair. The gradient recorded across the prosthetic valve is within the expected range. No perivalvular leak noted. . Tricuspid Valve: There is mild regurgitation. . Pulmonic Valve: The pulmonic valve appears unchanged from pre-bypass. . Interatrial Septum: The interatrial septum appears unchanged from pre-bypass. . Pericardium: The pericardium appears unchanged from pre-by   10/21/20 TEE LVEF normal. RV systolic function is now moderately reduced w/ mild-mod TR, in setting of severe MR.  ROS: All systems negative except as listed in HPI, PMH and Problem List.  SH:  Social History   Socioeconomic History  . Marital status: Single    Spouse name: Not on file  . Number of children: 1  . Years of education: Not on file  . Highest education level: Not on file  Occupational History  . Occupation: Retired  Tobacco Use  . Smoking status: Former Smoker    Quit date: 05/16/1969    Years since quitting: 51.5  . Smokeless tobacco: Former Network engineer  . Vaping Use: Never used  Substance and Sexual Activity  . Alcohol use: No    Alcohol/week: 0.0 standard drinks  . Drug use: No  . Sexual activity: Never  Other Topics Concern  . 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  Difficulty of Paying Living Expenses: Not hard at all  Food Insecurity: No Food Insecurity  . Worried About Charity fundraiser in the Last Year: Never true  . Ran Out of Food in the Last Year: Never true  Transportation  Needs: No Transportation Needs  . Lack of Transportation (Medical): No  . Lack of Transportation (Non-Medical): No  Physical Activity: Inactive  . Days of Exercise per Week: 0 days  . Minutes of Exercise per Session: 0 min  Stress: No Stress Concern Present  . Feeling of Stress : Not at all  Social Connections: Not on file  Intimate Partner Violence: Not At Risk  . Fear of Current or Ex-Partner: No  . Emotionally Abused: No  . Physically Abused: No  . Sexually Abused: No    FH:  Family History  Problem Relation Age of Onset  . Cancer Mother        ovarian  . Sudden death Mother   . Diabetes Sister   . Cancer Maternal Grandmother        lung  . Heart disease Maternal Grandmother   . Hypertension Sister   . Hypertension Cousin   . CAD Neg Hx   . Stroke Neg Hx     Past Medical History:  Diagnosis Date  . Atrial fibrillation with RVR ([place]) 2014   persistent  . Chronic diastolic CHF (congestive heart failure) (HCC)    HTN  . Chronic diastolic congestive heart failure ([place])   . COVID-19 virus infection 01/20/2019  . ESOPHAGEAL STRICTURE 02/19/2005   Qualifier: Diagnosis of  By: Amil Amen MD, Benjamine Mola    . Goiter   . History of anemia    unclear cause, now resolved  . History of chicken pox   . History of colon polyps    benign  . HLD (hyperlipidemia)   . Hypothyroidism   . Malignant hypertension longstanding  . Microcytosis   . Mitral valve regurgitation   . Osteopenia 12/2013   T -2.3 hip  . Osteoporosis   . Pulmonary hypertension ([place])   . Rheumatoid arthritis ([place]) 11/2015   +RF, +CCP, ESR 49, synovitis on exam and [place] 11/2015 [facility])  . Right lower lobe pneumonia 07/24/2014  . 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   29 mm Medtronic Mosaic stented porcine bioprosthetic tissue valve  . Tricuspid regurgitation   . Vitamin D deficiency     Current Outpatient Medications  Medication Sig Dispense Refill  . acetaminophen (TYLENOL) 325 MG  tablet Take 650 mg by mouth every 6 (six) hours as needed (pain).    Marland Kitchen aspirin EC 81 MG tablet Take 81 mg by mouth once. Swallow whole.    . Calcium Carb-Cholecalciferol (CALCIUM-VITAMIN D) 600-400 MG-UNIT TABS Take 1 tablet by mouth in the morning.    . cholecalciferol (VITAMIN D) 1000 UNITS tablet Take 1,000 Units by mouth in the morning.    . fluticasone (FLONASE) 50 MCG/ACT nasal spray Place 2 sprays into both nostrils daily. (Patient taking differently: Place 2 sprays into both nostrils daily as needed for allergies.) 16 g 0  . furosemide (LASIX) 20 MG tablet TAKE 1 TABLET EVERY DAY 90 tablet 3  . levothyroxine (SYNTHROID) 125 MCG tablet Take 1 tablet (125 mcg total) by mouth daily before breakfast. 90 tablet 3  . methotrexate (RHEUMATREX) 2.5 MG tablet Take 15 mg by mouth every Friday. Caution:Chemotherapy. Protect from light.    . sacubitril-valsartan (ENTRESTO) 49-51 MG Take 1 tablet by mouth 2 (two)  times daily. 60 tablet 3  . spironolactone (ALDACTONE) 25 MG tablet Take 1 tablet (25 mg total) by mouth daily. 30 tablet 3  . traMADol (ULTRAM) 50 MG tablet Take 1 tablet (50 mg total) by mouth every 6 (six) hours as needed for moderate pain. 30 tablet 0  . warfarin (COUMADIN) 4 MG tablet Take 1 tablet (4 mg total) by mouth daily at 4 PM. 30 tablet 1  . folic acid (FOLVITE) 1 MG tablet Take 1 tablet (1 mg total) by mouth daily. (Patient not taking: Reported on 11/19/2020)     No current facility-administered medications for this encounter.    Vitals:   11/19/20 1142  BP: (!) 160/90  Pulse: 65  SpO2: 94%  Weight: 66 kg   Wt Readings from Last 3 Encounters:  11/19/20 66 kg  11/12/20 66.7 kg  11/07/20 68.8 kg    PHYSICAL EXAM: General:  Well appearing. No resp difficulty HEENT: normal Neck: supple. JVP flat. Carotids 2+ bilaterally; no bruits. No lymphadenopathy or thryomegaly appreciated. Cor: PMI normal. Regular rate & rhythm. No rubs, gallops or murmurs. Lungs: clear Abdomen:  soft, nontender, nondistended. No hepatosplenomegaly. No bruits or masses. Good bowel sounds. Extremities: no cyanosis, clubbing, rash, edema Neuro: alert & orientedx3, cranial nerves grossly intact. Moves all 4 extremities w/o difficulty. Affect pleasant.  ASSESSMENT & PLAN: 1. Severe Rheumatic Mitral Valve Regurgitation --> 10/28/20 S/P MVR - Primary MR. LVEF normal 60-65%  - TEE 5/2/2022confirmed severe MR. LHC w/ normal coronaries - S/P Bioprosthetic MVR 10/28/20 . Repeat ECHO set up 11/2020  - INR followed at [facility].  - Doing great. Starting cardiac rehab soon.   2. Severe Pulmonary Hypertension  - TTE 8/21 w/ severely elevated RVSP, 78 mmHg. 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 WHO Group 1 2/2 CTD (RA)  - Repeat RHC on 5/6 with improved hemodynamics but PA pressures and PVR still elevated.   3.Chronic Diastolic Heart Failure w/ Predominant RV Dysfunction  -2D Echo 8/21 showed normal LVEF, 60-65%, w/ mild LVH and G3DD c/w restrictive physiology. RV was mildly reduced at the time - TEE 10/21/20 LVEF normal. RV systolic function is now moderately reduced w/ mild-mod TR, in setting of progressive/ now severe MR. - NYHA II. Weight trending down. Can change lasix to as needed for weight 144 or greater - Asked to hold lasix if weight < 140 pounds.  - Continue Spiro 25 mg daily  -Increased entresto 97-103 mg twice a day.  - Hold off on b-blocker  - Consider SGLT2i next visit.   4. Chronic Afib: in setting of severe MR w/ severe LAE. - On coumadin  -Check CBC  5. Rheumatoid Arthritis - on MTX - followed by rheumatology  6. Hypertension  Continue the above and as noted increase entresto to 97-103 mg twice a day  Check BMET in 7 days   I reviewed D/C summary and recent lab work from PCP  11/12/20.   Follow up with Dr [name] in 6-8 weeks. She will continue to follow with Dr Gwenlyn Found as her primary cardiologist.   Darrick Grinder NP-C   12:55 PM

## 2020-11-19 ENCOUNTER — Ambulatory Visit (HOSPITAL_COMMUNITY)
Admit: 2020-11-19 | Discharge: 2020-11-19 | Disposition: A | Discharge status: Home / Self Care | Payer: Medicare HMO | Attending: Cardiology | Admitting: Cardiology

## 2020-11-19 ENCOUNTER — Other Ambulatory Visit: Payer: Self-pay

## 2020-11-19 ENCOUNTER — Encounter (HOSPITAL_COMMUNITY): Payer: Self-pay

## 2020-11-19 VITALS — BP 160/90 | HR 65 | Wt 145.4 lb

## 2020-11-19 DIAGNOSIS — Z87891 Personal history of nicotine dependence: Secondary | ICD-10-CM | POA: Insufficient documentation

## 2020-11-19 DIAGNOSIS — I10 Essential (primary) hypertension: Secondary | ICD-10-CM

## 2020-11-19 DIAGNOSIS — I272 Pulmonary hypertension, unspecified: Secondary | ICD-10-CM | POA: Diagnosis not present

## 2020-11-19 DIAGNOSIS — I5032 Chronic diastolic (congestive) heart failure: Secondary | ICD-10-CM | POA: Diagnosis not present

## 2020-11-19 DIAGNOSIS — Z79899 Other long term (current) drug therapy: Secondary | ICD-10-CM | POA: Insufficient documentation

## 2020-11-19 DIAGNOSIS — Z7901 Long term (current) use of anticoagulants: Secondary | ICD-10-CM | POA: Diagnosis not present

## 2020-11-19 DIAGNOSIS — Z79890 Hormone replacement therapy (postmenopausal): Secondary | ICD-10-CM | POA: Diagnosis not present

## 2020-11-19 DIAGNOSIS — I482 Chronic atrial fibrillation, unspecified: Secondary | ICD-10-CM | POA: Insufficient documentation

## 2020-11-19 DIAGNOSIS — M069 Rheumatoid arthritis, unspecified: Secondary | ICD-10-CM | POA: Diagnosis not present

## 2020-11-19 DIAGNOSIS — Z953 Presence of xenogenic heart valve: Secondary | ICD-10-CM | POA: Insufficient documentation

## 2020-11-19 DIAGNOSIS — I4819 Other persistent atrial fibrillation: Secondary | ICD-10-CM | POA: Diagnosis not present

## 2020-11-19 DIAGNOSIS — Z8249 Family history of ischemic heart disease and other diseases of the circulatory system: Secondary | ICD-10-CM | POA: Insufficient documentation

## 2020-11-19 DIAGNOSIS — Z7982 Long term (current) use of aspirin: Secondary | ICD-10-CM | POA: Diagnosis not present

## 2020-11-19 DIAGNOSIS — I110 Hypertensive heart disease with heart failure: Secondary | ICD-10-CM | POA: Diagnosis not present

## 2020-11-19 DIAGNOSIS — I50810 Right heart failure, unspecified: Secondary | ICD-10-CM | POA: Insufficient documentation

## 2020-11-19 DIAGNOSIS — Z8616 Personal history of COVID-19: Secondary | ICD-10-CM | POA: Insufficient documentation

## 2020-11-19 DIAGNOSIS — I051 Rheumatic mitral insufficiency: Secondary | ICD-10-CM | POA: Diagnosis not present

## 2020-11-19 MED ORDER — SACUBITRIL-VALSARTAN 97-103 MG PO TABS
1.0000 | ORAL_TABLET | Freq: Two times a day (BID) | ORAL | 3 refills | Status: DC
Start: 2020-11-19 — End: 2020-12-09

## 2020-11-19 NOTE — Patient Instructions (Signed)
Increase Entresto to 97/103 mg twice daily Labs in 7 days  Follow-up appt in 2 months

## 2020-11-21 ENCOUNTER — Other Ambulatory Visit: Payer: Self-pay

## 2020-11-21 ENCOUNTER — Ambulatory Visit (INDEPENDENT_AMBULATORY_CARE_PROVIDER_SITE_OTHER): Payer: Medicare HMO

## 2020-11-21 DIAGNOSIS — Z7901 Long term (current) use of anticoagulants: Secondary | ICD-10-CM

## 2020-11-21 DIAGNOSIS — Z953 Presence of xenogenic heart valve: Secondary | ICD-10-CM | POA: Diagnosis not present

## 2020-11-21 DIAGNOSIS — I4819 Other persistent atrial fibrillation: Secondary | ICD-10-CM | POA: Diagnosis not present

## 2020-11-21 LAB — POCT INR: INR: 3.5 — AB (ref 2.0–3.0)

## 2020-11-21 NOTE — Patient Instructions (Signed)
Hold today only and then decrease to take 1 tablet Daily, except 0.5 on Wednesday  Recheck 1 week.   [phone]

## 2020-11-22 ENCOUNTER — Other Ambulatory Visit: Payer: Self-pay | Admitting: Thoracic Surgery (Cardiothoracic Vascular Surgery)

## 2020-11-22 DIAGNOSIS — Z953 Presence of xenogenic heart valve: Secondary | ICD-10-CM

## 2020-11-25 ENCOUNTER — Other Ambulatory Visit: Payer: Self-pay

## 2020-11-25 ENCOUNTER — Ambulatory Visit (INDEPENDENT_AMBULATORY_CARE_PROVIDER_SITE_OTHER): Payer: Self-pay | Admitting: Surgical

## 2020-11-25 ENCOUNTER — Ambulatory Visit
Admission: RE | Admit: 2020-11-25 | Discharge: 2020-11-25 | Disposition: A | Discharge status: Home / Self Care | Payer: Medicare HMO | Source: Ambulatory Visit | Attending: Thoracic Surgery (Cardiothoracic Vascular Surgery) | Admitting: Thoracic Surgery (Cardiothoracic Vascular Surgery)

## 2020-11-25 VITALS — BP 163/66 | HR 69 | Resp 20 | Ht 64.0 in | Wt 143.0 lb

## 2020-11-25 DIAGNOSIS — I700 Atherosclerosis of aorta: Secondary | ICD-10-CM | POA: Diagnosis not present

## 2020-11-25 DIAGNOSIS — Z953 Presence of xenogenic heart valve: Secondary | ICD-10-CM

## 2020-11-25 DIAGNOSIS — Z952 Presence of prosthetic heart valve: Secondary | ICD-10-CM | POA: Diagnosis not present

## 2020-11-25 DIAGNOSIS — M47814 Spondylosis without myelopathy or radiculopathy, thoracic region: Secondary | ICD-10-CM | POA: Diagnosis not present

## 2020-11-25 NOTE — Patient Instructions (Signed)
Routine postsurgical instructions including activity progression and driving.

## 2020-11-25 NOTE — Progress Notes (Signed)
[address]       [place] [address]             [phone]      Joanna Hood Medical Record #[id number] Date of Birth: 06/08/1946  Referring: Lorretta Harp, MD Primary Care: Ria Bush, MD Primary Cardiologist: None   Chief Complaint:   POST OP FOLLOW UP         CARDIOTHORACIC SURGERY OPERATIVE NOTE  Date of Procedure:                10/28/2020  Preoperative Diagnosis:        Severe Mitral Regurgitation  Permanent Atrial Fibrillation  Postoperative Diagnosis:    Same  Procedure:       Mitral Valve Replacement              Medtronic Mosaic Stented Porcine Bioprosthetic Tissue Valve (size 68m, model #310, serial ##[id number]             Clipping of left atrial appendage (Atricure Pro245 left atrial clip, size 433m               Surgeon:        ClValentina GuOwRoxy MannsMD  Assistant:       ErEllwood HandlerPA-C  Anesthesia:    StHoy MornMD  Operative Findings: ? 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 Normal left ventricular systolic function ? Normal right ventricular size and function ? Severe pulmonary hypertension ? Mild (1+) tricuspid regurgitation         History of Present Illness:    The patient is a [age]ear old female status post the above described procedure seen in the office on today's date and routine postsurgical follow-up.  Patient reports that overall she is feeling well.  She denies fevers, chills or other significant constitutional symptoms.  She denies shortness of breath or chest pain.  She has had no difficulties with her incisions.  She denies palpitations.  She denies lower extremity edema.  She is being followed in the heart failure clinic.  She is being followed in the Coumadin clinic.  Overall she is pleased with her  general progress and has no specific complaints.      Past Medical History:  Diagnosis Date  . Atrial fibrillation with RVR ([facility]   persistent  . Chronic diastolic CHF (congestive heart failure) (HCC)    HTN  . Chronic diastolic congestive heart failure ([facility]  . COVID-19 virus infection 01/20/2019  . ESOPHAGEAL STRICTURE 02/19/2005   Qualifier: Diagnosis of  By: MuAmil AmenD, ElBenjamine Mola  . Goiter   . History of anemia    unclear cause, now resolved  . History of chicken pox   . History of colon polyps    benign  . HLD (hyperlipidemia)   . Hypothyroidism   . Malignant hypertension longstanding  . Microcytosis   . Mitral valve regurgitation   . Osteopenia 12/2013   T -2.3 hip  . Osteoporosis   . Pulmonary hypertension ([facility]  . Rheumatoid arthritis ([facility]/2017   +RF, +CCP, ESR 49, synovitis on exam [facility] [place] . Right lower lobe pneumonia 07/24/2014  . 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   29 mm Medtronic Mosaic stented porcine bioprosthetic tissue valve  . Tricuspid regurgitation   .  Vitamin D deficiency      Social History   Tobacco Use  Smoking Status Former Smoker  . Quit date: 05/16/1969  . Years since quitting: 51.5  Smokeless Tobacco Former Systems developer    Social History   Substance and Sexual Activity  Alcohol Use No  . Alcohol/week: 0.0 standard drinks     Allergies  Allergen Reactions  . Metronidazole Itching and Rash    Current Outpatient Medications  Medication Sig Dispense Refill  . acetaminophen (TYLENOL) 325 MG tablet Take 650 mg by mouth every 6 (six) hours as needed (pain).    Marland Kitchen aspirin EC 81 MG tablet Take 81 mg by mouth once. Swallow whole.    . Calcium Carb-Cholecalciferol (CALCIUM-VITAMIN D) 600-400 MG-UNIT TABS Take 1 tablet by mouth in the morning.    . cholecalciferol (VITAMIN D) 1000 UNITS tablet Take 1,000 Units by mouth in the morning.    . fluticasone (FLONASE) 50 MCG/ACT nasal spray Place 2  sprays into both nostrils daily. (Patient taking differently: Place 2 sprays into both nostrils daily as needed for allergies.) 16 g 0  . folic acid (FOLVITE) 1 MG tablet Take 1 tablet (1 mg total) by mouth daily.    . furosemide (LASIX) 20 MG tablet TAKE 1 TABLET EVERY DAY 90 tablet 3  . levothyroxine (SYNTHROID) 125 MCG tablet Take 1 tablet (125 mcg total) by mouth daily before breakfast. 90 tablet 3  . methotrexate (RHEUMATREX) 2.5 MG tablet Take 15 mg by mouth every Friday. Caution:Chemotherapy. Protect from light.    . sacubitril-valsartan (ENTRESTO) 97-103 MG Take 1 tablet by mouth 2 (two) times daily. 60 tablet 3  . spironolactone (ALDACTONE) 25 MG tablet Take 1 tablet (25 mg total) by mouth daily. 30 tablet 3  . traMADol (ULTRAM) 50 MG tablet Take 1 tablet (50 mg total) by mouth every 6 (six) hours as needed for moderate pain. 30 tablet 0  . warfarin (COUMADIN) 4 MG tablet Take 1 tablet (4 mg total) by mouth daily at 4 PM. 30 tablet 1   No current facility-administered medications for this visit.       Physical Exam: Ht 5' 4"  (1.626 m)   BMI 24.96 kg/m   General appearance: alert, cooperative and no distress Heart: regular rate and rhythm Lungs: clear to auscultation bilaterally Abdomen: benign Extremities: no edema Wound: incis healing well without signs of infection   Diagnostic Studies & Laboratory data:     Recent Radiology Findings:   DG Chest 2 View  Result Date: 11/25/2020 CLINICAL DATA:  Status post mitral valve replacement EXAM: CHEST - 2 VIEW COMPARISON:  Nov 03, 2020 FINDINGS: Lungs are clear. Heart is enlarged with pulmonary vascularity normal. Patient is status post mitral valve replacement. There is a left atrial appendage clamp. There is aortic atherosclerosis. No adenopathy. There are foci of degenerative change in the thoracic spine. No pneumothorax. IMPRESSION: Lungs clear. Cardiomegaly with evidence of prior mitral valve replacement and left atrial  appendage clamp placement. Aortic Atherosclerosis (ICD10-I70.0). Electronically Signed   By: Lowella Grip III M.D.   On: 11/25/2020 13:36      Recent Lab Findings: Lab Results  Component Value Date   WBC 6.6 11/12/2020   HGB 9.7 (L) 11/12/2020   HCT 29.8 (L) 11/12/2020   PLT 290.0 11/12/2020   GLUCOSE 94 11/12/2020   CHOL 144 09/10/2020   TRIG 81.0 09/10/2020   HDL 32.60 (L) 09/10/2020   LDLCALC 96 09/10/2020   ALT 9 10/31/2020   AST  30 10/31/2020   NA 135 11/12/2020   K 3.9 11/12/2020   CL 99 11/12/2020   CREATININE 0.77 11/12/2020   BUN 10 11/12/2020   CO2 28 11/12/2020   TSH 0.64 09/10/2020   INR 3.5 (A) 11/21/2020   HGBA1C 5.0 10/28/2020      Assessment / Plan: The patient is doing quite well.  She has no specific surgically related issues.  He does have some elevated blood pressure readings and will follow up with cardiology in regards to medication management.  She does state that she is no longer taking Lasix.  As there are no significant surgically related issues currently we will see her again on a as needed basis for any surgically related issues or at request.      Medication Changes: No orders of the defined types were placed in this encounter.     John Giovanni, PA-C 11/25/2020 1:45 PM

## 2020-11-26 ENCOUNTER — Other Ambulatory Visit (HOSPITAL_COMMUNITY): Payer: Self-pay | Admitting: Internal Medicine

## 2020-11-26 ENCOUNTER — Ambulatory Visit (HOSPITAL_COMMUNITY)
Admission: RE | Admit: 2020-11-26 | Discharge: 2020-11-26 | Disposition: A | Discharge status: Home / Self Care | Payer: Medicare HMO | Source: Ambulatory Visit | Attending: Cardiology | Admitting: Cardiology

## 2020-11-26 DIAGNOSIS — I272 Pulmonary hypertension, unspecified: Secondary | ICD-10-CM

## 2020-11-26 LAB — BASIC METABOLIC PANEL
Anion gap: 5 (ref 5–15)
BUN: 15 mg/dL (ref 8–23)
CO2: 26 mmol/L (ref 22–32)
Calcium: 9.4 mg/dL (ref 8.9–10.3)
Chloride: 103 mmol/L (ref 98–111)
Creatinine, Ser: 0.88 mg/dL (ref 0.44–1.00)
GFR, Estimated: >60 mL/min (ref >60)
Glucose, Bld: 94 mg/dL (ref 70–99)
Potassium: 4.7 mmol/L (ref 3.5–5.1)
Sodium: 134 mmol/L — ABNORMAL LOW (ref 135–145)

## 2020-11-29 ENCOUNTER — Ambulatory Visit (INDEPENDENT_AMBULATORY_CARE_PROVIDER_SITE_OTHER): Payer: Medicare HMO

## 2020-11-29 ENCOUNTER — Other Ambulatory Visit: Payer: Self-pay

## 2020-11-29 DIAGNOSIS — I4819 Other persistent atrial fibrillation: Secondary | ICD-10-CM

## 2020-11-29 DIAGNOSIS — Z7901 Long term (current) use of anticoagulants: Secondary | ICD-10-CM | POA: Diagnosis not present

## 2020-11-29 DIAGNOSIS — Z953 Presence of xenogenic heart valve: Secondary | ICD-10-CM

## 2020-11-29 LAB — POCT INR: INR: 3 (ref 2.0–3.0)

## 2020-11-29 NOTE — Patient Instructions (Signed)
Continue taking 1 tablet Daily, except 0.5 on Wednesday  Recheck 3 weeks.   [phone]

## 2020-12-06 ENCOUNTER — Other Ambulatory Visit: Payer: Self-pay | Admitting: Family Medicine

## 2020-12-06 MED ORDER — FOLIC ACID 1 MG PO TABS: 1.0000 mg | ORAL_TABLET | Freq: Every day | ORAL | 3 refills | Status: DC

## 2020-12-06 NOTE — Telephone Encounter (Signed)
Plz notify this has been ERx.

## 2020-12-06 NOTE — Addendum Note (Signed)
Addended by: Brenton Grills on: 02/01/7516 00:17 AM   Modules accepted: Orders

## 2020-12-06 NOTE — Addendum Note (Signed)
Addended by: Ria Bush on: 12/06/2020 01:14 PM   Modules accepted: Orders

## 2020-12-06 NOTE — Telephone Encounter (Signed)
Spoke with pt notifying her rx was sent.  Pt expresses her thanks.

## 2020-12-06 NOTE — Telephone Encounter (Signed)
Patient called in stating that she has not been able to find folic acid in the local pharmacies. Patient states that she can get it from [facility] but they have not received a order request for it. Can we please resend to [facility] for the patient EM

## 2020-12-09 ENCOUNTER — Other Ambulatory Visit (HOSPITAL_COMMUNITY): Payer: Self-pay | Admitting: Unknown Physician Specialty

## 2020-12-09 MED ORDER — SACUBITRIL-VALSARTAN 97-103 MG PO TABS: 1.0000 | ORAL_TABLET | Freq: Two times a day (BID) | ORAL | 6 refills | Status: DC

## 2020-12-11 ENCOUNTER — Other Ambulatory Visit (HOSPITAL_COMMUNITY): Payer: Self-pay | Admitting: *Deleted

## 2020-12-11 DIAGNOSIS — I10 Essential (primary) hypertension: Secondary | ICD-10-CM

## 2020-12-11 MED ORDER — SACUBITRIL-VALSARTAN 97-103 MG PO TABS: 1.0000 | ORAL_TABLET | Freq: Two times a day (BID) | ORAL | 6 refills | Status: DC

## 2020-12-16 ENCOUNTER — Other Ambulatory Visit: Payer: Self-pay

## 2020-12-16 ENCOUNTER — Ambulatory Visit (HOSPITAL_COMMUNITY): Payer: Medicare HMO | Attending: Cardiovascular Disease

## 2020-12-16 DIAGNOSIS — Z953 Presence of xenogenic heart valve: Secondary | ICD-10-CM | POA: Diagnosis not present

## 2020-12-16 DIAGNOSIS — I051 Rheumatic mitral insufficiency: Secondary | ICD-10-CM | POA: Diagnosis not present

## 2020-12-16 LAB — ECHOCARDIOGRAM COMPLETE
AR max vel: 1.29 cm2
AV Area VTI: 1.28 cm2
AV Area mean vel: 1.23 cm2
AV Mean grad: 12 mmHg
AV Peak grad: 17.5 mmHg
Ao pk vel: 2.09 m/s
Area-P 1/2: 3.82 cm2
MV VTI: 1.24 cm2
S' Lateral: 3 cm

## 2020-12-16 MED ORDER — PERFLUTREN LIPID MICROSPHERE
1.0000 mL | INTRAVENOUS | Status: AC | PRN
  Administered 2020-12-16: 1 mL via INTRAVENOUS

## 2020-12-17 ENCOUNTER — Other Ambulatory Visit: Payer: Self-pay

## 2020-12-17 DIAGNOSIS — Z953 Presence of xenogenic heart valve: Secondary | ICD-10-CM

## 2020-12-17 DIAGNOSIS — I051 Rheumatic mitral insufficiency: Secondary | ICD-10-CM

## 2020-12-17 NOTE — Progress Notes (Signed)
eho

## 2020-12-18 ENCOUNTER — Encounter: Payer: Self-pay | Admitting: Family Medicine

## 2020-12-18 ENCOUNTER — Other Ambulatory Visit: Payer: Self-pay

## 2020-12-18 ENCOUNTER — Ambulatory Visit (INDEPENDENT_AMBULATORY_CARE_PROVIDER_SITE_OTHER): Payer: Medicare HMO | Admitting: Family Medicine

## 2020-12-18 VITALS — BP 154/84 | HR 53 | Temp 97.5°F | Ht 64.0 in | Wt 143.1 lb

## 2020-12-18 DIAGNOSIS — I10 Essential (primary) hypertension: Secondary | ICD-10-CM

## 2020-12-18 DIAGNOSIS — I4819 Other persistent atrial fibrillation: Secondary | ICD-10-CM

## 2020-12-18 DIAGNOSIS — Z953 Presence of xenogenic heart valve: Secondary | ICD-10-CM

## 2020-12-18 DIAGNOSIS — M0579 Rheumatoid arthritis with rheumatoid factor of multiple sites without organ or systems involvement: Secondary | ICD-10-CM | POA: Diagnosis not present

## 2020-12-18 DIAGNOSIS — D509 Iron deficiency anemia, unspecified: Secondary | ICD-10-CM

## 2020-12-18 DIAGNOSIS — Z7901 Long term (current) use of anticoagulants: Secondary | ICD-10-CM | POA: Diagnosis not present

## 2020-12-18 MED ORDER — FERROUS SULFATE 325 (65 FE) MG PO TABS: 325.0000 mg | ORAL_TABLET | ORAL | Status: DC

## 2020-12-18 NOTE — Assessment & Plan Note (Signed)
Sees rheum, on methotrexate.  Had not been taking folate. Has had trouble finding OTC folate as well as insurance coverage for this - I asked her to let me know what she finds out about preferred insurance dosing, and suggested she also touch base with rheum regarding this.

## 2020-12-18 NOTE — Assessment & Plan Note (Signed)
Continues oral iron MWF.

## 2020-12-18 NOTE — Assessment & Plan Note (Signed)
Stable period after surgery.

## 2020-12-18 NOTE — Assessment & Plan Note (Signed)
Remains in atrial fibrillation - continue coumadin managed through cardiology clinic.

## 2020-12-18 NOTE — Patient Instructions (Addendum)
Check on preferred dose for folic acid by [facility]).  Blood pressure is staying too high. Restart lasix 10-20mg  daily. Let us know if any trouble with this.  Bring in home cuff to next visit to compare cuffs.  Return in 3 months for follow up visit.

## 2020-12-18 NOTE — Progress Notes (Signed)
Patient ID: Joanna Hood, female    DOB: August 18, 1945, 75 y.o.   MRN: [id number]  This visit was conducted in person.  BP (!) 154/84 (BP Location: Right Arm, Cuff Size: Normal)   Pulse (!) 53   Temp (!) 97.5 F (36.4 C) (Temporal)   Ht 5\' 4"  (1.626 m)   Wt 143 lb 1 oz (64.9 kg)   SpO2 99%   BMI 24.56 kg/m   BP Readings from Last 3 Encounters:  12/18/20 (!) 154/84  11/25/20 (!) 163/66  11/19/20 (!) 160/90   CC: HTN Subjective:   HPI: Joanna Hood is a 76 y.o. 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 Continues entresto and coumadin as well as aspirin. Recent echocardiogram reassuring, following closely with CHF clinic.   HTN - Compliant with current antihypertensive regimen of entresto 97/103 bid, spironolactone 25mg  daily. She stopped her lasix 20mg  due to urinary frequency. Off BB due to bradycardia. Does check blood pressures at home: range 130-150/60-70. No low blood pressure readings or symptoms of dizziness/syncope. Denies HA, vision changes, CP/tightness, SOB, leg swelling.  She continues limiting salt/sodium in the diet.   Known rheumatoid arthritis - upcoming labs next month through rheumatology Trudie Reed). Has had difficulty finding folic acid, our Rx was not covered by insurance - she will check on preferred dose by insurance and let me know.      Relevant past medical, surgical, family and social history reviewed and updated as indicated. Interim medical history since our last visit reviewed. Allergies and medications reviewed and updated. Outpatient Medications Prior to Visit  Medication Sig Dispense Refill   acetaminophen (TYLENOL) 325 MG tablet Take 650 mg by mouth every 6 (six) hours as needed (pain).     aspirin EC 81 MG tablet Take 81 mg by mouth once. Swallow whole.      Calcium Carb-Cholecalciferol (CALCIUM-VITAMIN D) 600-400 MG-UNIT TABS Take 1 tablet by mouth in the morning.     cholecalciferol (VITAMIN D) 1000 UNITS tablet Take 1,000 Units by mouth in the morning.     fluticasone (FLONASE) 50 MCG/ACT nasal spray Place 2 sprays into both nostrils daily. (Patient taking differently: Place 2 sprays into both nostrils daily as needed for allergies.) 16 g 0   folic acid (FOLVITE) 1 MG tablet Take 1 tablet (1 mg total) by mouth daily. 90 tablet 3   levothyroxine (SYNTHROID) 125 MCG tablet Take 1 tablet (125 mcg total) by mouth daily before breakfast. 90 tablet 3   methotrexate (RHEUMATREX) 2.5 MG tablet Take 15 mg by mouth every Friday. Caution:Chemotherapy. Protect from light.     sacubitril-valsartan (ENTRESTO) 97-103 MG Take 1 tablet by mouth 2 (two) times daily. 60 tablet 6   spironolactone (ALDACTONE) 25 MG tablet Take 1 tablet (25 mg total) by mouth daily. 30 tablet 3   traMADol (ULTRAM) 50 MG tablet Take 1 tablet (50 mg total) by mouth every 6 (six) hours as needed for moderate pain. 30 tablet 0   warfarin (COUMADIN) 4 MG tablet Take 1 tablet (4 mg total) by mouth daily at 4 PM. 30 tablet 1   ferrous sulfate 325 (65 FE) MG tablet Take 325 mg by mouth daily with breakfast.     furosemide (LASIX) 20 MG tablet TAKE 1 TABLET EVERY DAY 90 tablet 3   No facility-administered  medications prior to visit.     Per HPI unless specifically indicated in ROS section below Review of Systems  Objective:  BP (!) 154/84 (BP Location: Right Arm, Cuff Size: Normal)   Pulse (!) 53   Temp (!) 97.5 F (36.4 C) (Temporal)   Ht 5\' 4"  (1.626 m)   Wt 143 lb 1 oz (64.9 kg)   SpO2 99%   BMI 24.56 kg/m   Wt Readings from Last 3 Encounters:  12/18/20 143 lb 1 oz (64.9 kg)  11/25/20 143 lb (64.9 kg)  11/19/20 145 lb 6.4 oz (66 kg)      Physical Exam Vitals and nursing note reviewed.  Constitutional:      Appearance: Normal appearance. She is not ill-appearing.   Cardiovascular:     Rate and Rhythm: Normal rate. Rhythm irregularly irregular.     Pulses: Normal pulses.     Heart sounds: Normal heart sounds. No murmur heard. Pulmonary:     Effort: Pulmonary effort is normal. No respiratory distress.     Breath sounds: Normal breath sounds. No wheezing, rhonchi or rales.  Skin:    General: Skin is warm and dry.     Findings: No rash.  Neurological:     Mental Status: She is alert.  Psychiatric:        Mood and Affect: Mood normal.        Behavior: Behavior normal.      Results for orders placed or performed in visit on 12/16/20  ECHOCARDIOGRAM COMPLETE  Result Value Ref Range   S' Lateral 3.00 cm   AR max vel 1.29 cm2   AV Area VTI 1.28 cm2   AV Mean grad 12.0 mmHg   AV Peak grad 17.5 mmHg   Ao pk vel 2.09 m/s   Area-P 1/2 3.82 cm2   AV Area mean vel 1.23 cm2   MV VTI 1.24 cm2   Lab Results  Component Value Date   CREATININE 0.88 11/26/2020   BUN 15 11/26/2020   NA 134 (L) 11/26/2020   K 4.7 11/26/2020   CL 103 11/26/2020   CO2 26 11/26/2020    Assessment & Plan:  This visit occurred during the SARS-CoV-2 public health emergency.  Safety protocols were in place, including screening questions prior to the visit, additional usage of staff PPE, and extensive cleaning of exam room while observing appropriate contact time as indicated for disinfecting solutions.   Problem List Items Addressed This Visit     Persistent atrial fibrillation ([place])    Remains in atrial fibrillation - continue coumadin managed through cardiology clinic.        HTN (hypertension), malignant - Primary    Chronic, remains uncontrolled.  Has been off lasix due to polyuria - recommend she restart this at 10-20 mg daily as she preferred not to increase spironolactone at this time.  I asked her to bring in home cuff to next visit to compare to our readings.  RTC 3 mo f/u visit.        Microcytic anemia    Continues oral iron MWF.        Relevant  Medications   ferrous sulfate 325 (65 FE) MG tablet   Rheumatoid arthritis (HCC)    Sees rheum, on methotrexate.  Had not been taking folate. Has had trouble finding OTC folate as well as insurance coverage for this - I asked her to let me know what she finds out about preferred insurance dosing, and suggested she also touch  base with rheum regarding this.        S/P mitral valve replacement with bioprosthetic valve    Stable period after surgery.        Long term (current) use of anticoagulants     Meds ordered this encounter  Medications   ferrous sulfate 325 (65 FE) MG tablet    Sig: Take 1 tablet (325 mg total) by mouth every Monday, Wednesday, and Friday.   No orders of the defined types were placed in this encounter.   Patient Instructions  Check on preferred dose for folic acid by [facility]).  Blood pressure is staying too high. Restart lasix 10-20mg  daily. Let us know if any trouble with this.  Bring in home cuff to next visit to compare cuffs.  Return in 3 months for follow up visit.   Follow up plan: Return in about 3 months (around 03/20/2021), or if symptoms worsen or fail to improve, for follow up visit.  Ria Bush, MD

## 2020-12-18 NOTE — Assessment & Plan Note (Signed)
Chronic, remains uncontrolled.  Has been off lasix due to polyuria - recommend she restart this at 10-20 mg daily as she preferred not to increase spironolactone at this time.  I asked her to bring in home cuff to next visit to compare to our readings.  RTC 3 mo f/u visit.

## 2020-12-19 ENCOUNTER — Encounter: Payer: Self-pay | Admitting: Internal Medicine

## 2020-12-20 ENCOUNTER — Other Ambulatory Visit: Payer: Self-pay

## 2020-12-20 ENCOUNTER — Ambulatory Visit (INDEPENDENT_AMBULATORY_CARE_PROVIDER_SITE_OTHER): Payer: Medicare HMO

## 2020-12-20 DIAGNOSIS — Z7901 Long term (current) use of anticoagulants: Secondary | ICD-10-CM

## 2020-12-20 DIAGNOSIS — I4819 Other persistent atrial fibrillation: Secondary | ICD-10-CM | POA: Diagnosis not present

## 2020-12-20 DIAGNOSIS — Z953 Presence of xenogenic heart valve: Secondary | ICD-10-CM

## 2020-12-20 LAB — POCT INR: INR: 2.4 (ref 2.0–3.0)

## 2020-12-20 NOTE — Patient Instructions (Signed)
Continue taking 1 tablet Daily, except 0.5 on Wednesday  Recheck 6 weeks.   [phone]

## 2020-12-26 DIAGNOSIS — M0579 Rheumatoid arthritis with rheumatoid factor of multiple sites without organ or systems involvement: Secondary | ICD-10-CM | POA: Diagnosis not present

## 2020-12-30 ENCOUNTER — Telehealth: Payer: Self-pay | Admitting: Cardiovascular Disease

## 2020-12-30 NOTE — Telephone Encounter (Signed)
*  STAT* If patient is at the pharmacy, call can be transferred to refill team.   1. Which medications need to be refilled? (please list name of each medication and dose if known) warfarin (COUMADIN) 4 MG tablet  2. Which pharmacy/location (including street and city if local pharmacy) is medication to be sent to? [facility] ([facility] - [place], [place]  3. Do they need a 30 day or 90 day supply? 90 day   Patient has a coumadin appt 8/12 and an appt with Dr. Gwenlyn Found 9/30. Patient has enough for this week only.

## 2020-12-31 MED ORDER — WARFARIN SODIUM 4 MG PO TABS: 4.0000 mg | ORAL_TABLET | Freq: Every day | ORAL | 1 refills | Status: DC

## 2020-12-31 NOTE — Telephone Encounter (Signed)
Refill sent as requested. 

## 2021-01-06 ENCOUNTER — Other Ambulatory Visit: Payer: Self-pay | Admitting: *Deleted

## 2021-01-06 NOTE — Telephone Encounter (Signed)
I denied request as I've never prescribed this for patient. What is this for?

## 2021-01-06 NOTE — Telephone Encounter (Signed)
Message was left on voicemail stating that they sent over a faxed refill request for Tramadol for patient on 12/30/20 and was following up on the request. Message requested that a new script be sent in to them.  Last refill 11/07/20 #30 Ellwood Handler, PA-C Last office visit 12/18/20

## 2021-01-07 NOTE — Telephone Encounter (Signed)
Lvm asking pt to call back. Need to relay Dr. G's message and get answer to his question.  

## 2021-01-07 NOTE — Telephone Encounter (Signed)
Pt returning call.  States med was prescribed after heart surgery in Apr or May 2022.  Pt no longer needs med.  FYI to Dr. Darnell Level.

## 2021-01-08 ENCOUNTER — Encounter (HOSPITAL_COMMUNITY): Payer: Self-pay

## 2021-01-08 ENCOUNTER — Telehealth (HOSPITAL_COMMUNITY): Payer: Self-pay

## 2021-01-08 NOTE — Telephone Encounter (Signed)
Attempted to call patient in regards to Cardiac Rehab - LM on VM Mailed letter 

## 2021-01-21 NOTE — Telephone Encounter (Signed)
Called pt to see if she wanted to come in earlier for her cardiac rehab. LMTCB

## 2021-01-22 ENCOUNTER — Ambulatory Visit: Payer: Medicare HMO | Admitting: Internal Medicine

## 2021-01-22 ENCOUNTER — Encounter: Payer: Self-pay | Admitting: Internal Medicine

## 2021-01-22 ENCOUNTER — Other Ambulatory Visit (INDEPENDENT_AMBULATORY_CARE_PROVIDER_SITE_OTHER): Payer: Medicare HMO

## 2021-01-22 VITALS — BP 148/80 | HR 64 | Ht 64.0 in | Wt 140.8 lb

## 2021-01-22 DIAGNOSIS — R634 Abnormal weight loss: Secondary | ICD-10-CM

## 2021-01-22 DIAGNOSIS — D649 Anemia, unspecified: Secondary | ICD-10-CM | POA: Diagnosis not present

## 2021-01-22 DIAGNOSIS — K7689 Other specified diseases of liver: Secondary | ICD-10-CM | POA: Diagnosis not present

## 2021-01-22 DIAGNOSIS — Z7901 Long term (current) use of anticoagulants: Secondary | ICD-10-CM | POA: Diagnosis not present

## 2021-01-22 LAB — CBC WITH DIFFERENTIAL/PLATELET
Basophils Absolute: 0.1 10*3/uL (ref 0.0–0.1)
Basophils Relative: 1.3 % (ref 0.0–3.0)
Eosinophils Absolute: 0 10*3/uL (ref 0.0–0.7)
Eosinophils Relative: 1 % (ref 0.0–5.0)
HCT: 37.3 % (ref 36.0–46.0)
Hemoglobin: 11.8 g/dL — ABNORMAL LOW (ref 12.0–15.0)
Lymphocytes Relative: 18.4 % (ref 12.0–46.0)
Lymphs Abs: 0.9 10*3/uL (ref 0.7–4.0)
MCHC: 31.5 g/dL (ref 30.0–36.0)
MCV: 80.5 fl (ref 78.0–100.0)
Monocytes Absolute: 0.5 10*3/uL (ref 0.1–1.0)
Monocytes Relative: 9.6 % (ref 3.0–12.0)
Neutro Abs: 3.4 10*3/uL (ref 1.4–7.7)
Neutrophils Relative %: 69.7 % (ref 43.0–77.0)
Platelets: 175 10*3/uL (ref 150.0–400.0)
RBC: 4.64 Mil/uL (ref 3.87–5.11)
RDW: 15.1 % (ref 11.5–15.5)
WBC: 4.9 10*3/uL (ref 4.0–10.5)

## 2021-01-22 LAB — BASIC METABOLIC PANEL
BUN: 22 mg/dL (ref 6–23)
CO2: 25 mEq/L (ref 19–32)
Calcium: 10.3 mg/dL (ref 8.4–10.5)
Chloride: 104 mEq/L (ref 96–112)
Creatinine, Ser: 0.94 mg/dL (ref 0.40–1.20)
GFR: 60.14 mL/min (ref >60.00)
Glucose, Bld: 92 mg/dL (ref 70–99)
Potassium: 4.5 mEq/L (ref 3.5–5.1)
Sodium: 139 mEq/L (ref 135–145)

## 2021-01-22 NOTE — Patient Instructions (Signed)
Your provider has requested that you go to the basement level for lab work before leaving today. Press "B" on the elevator. The lab is located at the first door on the left as you exit the elevator.  You have been scheduled for an endoscopy. Please follow written instructions given to you at your visit today. If you use inhalers (even only as needed), please bring them with you on the day of your procedure.  You have been scheduled for a CT scan of the abdomen and pelvis at [facility] ([address]---this is in the same building as [employer]).   You are scheduled on 01/28/2021 at 2:00pm. You should arrive 15 minutes prior to your appointment time for registration. Please follow the written instructions below on the day of your exam:  WARNING: IF YOU ARE ALLERGIC TO IODINE/X-RAY DYE, PLEASE NOTIFY RADIOLOGY IMMEDIATELY AT [phone]! YOU WILL BE GIVEN A 13 HOUR PREMEDICATION PREP.  1) Do not eat anything after 10:00am (4 hours prior to your test) 2) You have been given 2 bottles of oral contrast to drink. The solution may taste better if refrigerated, but do NOT add ice or any other liquid to this solution. Shake well before drinking.    Drink 1 bottle of contrast @ 12:00pm (2 hours prior to your exam)  Drink 1 bottle of contrast @ 1:00pm (1 hour prior to your exam)  You may take any medications as prescribed with a small amount of water, if necessary. If you take any of the following medications: METFORMIN, GLUCOPHAGE, GLUCOVANCE, AVANDAMET, RIOMET, FORTAMET, Ransom MET, JANUMET, GLUMETZA or METAGLIP, you MAY be asked to HOLD this medication 48 hours AFTER the exam.  The purpose of you drinking the oral contrast is to aid in the visualization of your intestinal tract. The contrast solution may cause some diarrhea. Depending on your individual set of symptoms, you may also receive an intravenous injection of x-ray contrast/dye. Plan on being at [facility] for  30 minutes or longer, depending on the type of exam you are having performed.  This test typically takes 30-45 minutes to complete.  If you have any questions regarding your exam or if you need to reschedule, you may call the CT department at [phone] between the hours of 8:00 am and 5:00 pm, Monday-Friday.  ________________________________________________________________________

## 2021-01-22 NOTE — Progress Notes (Signed)
HISTORY OF PRESENT ILLNESS:  Joanna Hood is a 75 y.o. female with multiple medical problems including history of atrial fibrillation, congestive heart failure, mitral valve disease status post mitral valve replacement surgery Oct 28, 2020, chronic anticoagulation therapy, history of adenomatous colon polyps.  She is sent today by her primary care provider Dr. Danise Mina regarding progressive weight loss.  Review of the patient's record shows that her weight in August 2020 was 176 pounds.  In September 2021 was 176 pounds.  In April 2022 was 161 pounds.  Today, is 140 pounds.  Patient tells me that her appetite is reasonable, though she does not eat as much as she used to.  She also tells me that she has lost a little weight in the form of fluid related to heart failure.  This has improved since her surgery.  Her GI review of systems is quite unremarkable.  As a matter fact, it is entirely negative.  She has undergone prior colonoscopy in 2006, 2011, and most recently June 2017.  She is noted to have a small adenomatous colon polyps and sessile serrated polyp.  She is due for follow-up surveillance this year.  Review of outside blood work from Nov 12, 2020 shows anemia with hemoglobin 9.7.  Ferritin was elevated at 368.  Iron saturation 7.4%.  Patient was placed on iron.  Patient did undergo abdominal ultrasound April 2002 which revealed cholelithiasis without cholecystitis.  Also septated liver cyst measuring up to 2.7 cm  REVIEW OF SYSTEMS:  All non-GI ROS negative unless otherwise stated in the HPI except for heart murmur,  Past Medical History:  Diagnosis Date   Atrial fibrillation with RVR ([place]) 2014   persistent   Chronic diastolic CHF (congestive heart failure) ([place])    HTN   Chronic diastolic congestive heart failure ([place])    COVID-19 virus infection 01/20/2019   ESOPHAGEAL STRICTURE 02/19/2005   Qualifier: Diagnosis of  By: Amil Amen MD, Caleen Essex    History of anemia     unclear cause, now resolved   History of chicken pox    History of colon polyps    benign   HLD (hyperlipidemia)    Hypothyroidism    Malignant hypertension longstanding   Microcytosis    Mitral valve regurgitation    Osteopenia 12/2013   T -2.3 hip   Osteoporosis    Pulmonary hypertension (HCC)    Rheumatoid arthritis ([place]) 11/2015   +RF, +CCP, ESR 49, synovitis on exam and [place] 11/2015 Trudie Reed)   Right lower lobe pneumonia 07/24/2014   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   29 mm Medtronic Mosaic stented porcine bioprosthetic tissue valve   Tricuspid regurgitation    Vitamin D deficiency     Past Surgical History:  Procedure Laterality Date   CARDIAC CATHETERIZATION  2014   normal per patient   CLIPPING OF ATRIAL APPENDAGE N/A 10/28/2020   Procedure: CLIPPING OF ATRIAL APPENDAGE USING ATRICURE  CLIP SIZE 45MM;  Surgeon: Rexene Alberts, MD;  Location: [place];  Service: Open Heart Surgery;  Laterality: N/A;   COLONOSCOPY  11/2015   TA, int hem, rpt 5 yrs Henrene Pastor)   LEFT HEART CATHETERIZATION WITH CORONARY ANGIOGRAM N/A 05/01/2013   Procedure: LEFT HEART CATHETERIZATION WITH CORONARY ANGIOGRAM;  Surgeon: Leonie Man, MD;  Location: [facility] CATH LAB;  Service: Cardiovascular;  Laterality: N/A;   MITRAL VALVE REPAIR N/A 10/28/2020   Procedure: MITRAL VALVE REPLACEMENT (MVR) USING MEDTRONIC MOSAIC VALVE SIZE  29MM;  Surgeon: Rexene Alberts, MD;  Location: [place];  Service: Open Heart Surgery;  Laterality: N/A;   MULTIPLE EXTRACTIONS WITH ALVEOLOPLASTY N/A 10/24/2020   Procedure: MULTIPLE EXTRACTION WITH ALVEOLOPLASTY;  Surgeon: Charlaine Dalton, DMD;  Location: [place];  Service: Dentistry;  Laterality: N/A;   MULTIPLE TOOTH EXTRACTIONS  10/2020   Extractions of teeth numbers 3, 4, 7, 8, 9, 10, 11 and 14 - prior to valvular surgery   PARTIAL HYSTERECTOMY  1970   fibroids   RIGHT HEART CATH N/A 10/25/2020   Procedure: RIGHT HEART CATH;  Surgeon: Jolaine Artist, MD;   Location: [place] CV LAB;  Service: Cardiovascular;  Laterality: N/A;   RIGHT/LEFT HEART CATH AND CORONARY ANGIOGRAPHY N/A 10/21/2020   Procedure: RIGHT/LEFT HEART CATH AND CORONARY ANGIOGRAPHY;  Surgeon: Lorretta Harp, MD;  Location: [place] CV LAB;  Service: Cardiovascular;  Laterality: N/A;   TEE WITHOUT CARDIOVERSION N/A 10/21/2020   Procedure: TRANSESOPHAGEAL ECHOCARDIOGRAM (TEE);  Surgeon: Lelon Perla, MD;  Location: [facility] ENDOSCOPY;  Service: Cardiovascular;  Laterality: N/A;   TEE WITHOUT CARDIOVERSION N/A 10/28/2020   Procedure: TRANSESOPHAGEAL ECHOCARDIOGRAM (TEE);  Surgeon: Rexene Alberts, MD;  Location: [place];  Service: Open Heart Surgery;  Laterality: N/A;    Social History Natlie Iesha Summerhill  reports that she quit smoking about 51 years ago. She has quit using smokeless tobacco. She reports that she does not drink alcohol and does not use drugs.  family history includes Cancer in her maternal grandmother and mother; Diabetes in her sister; Heart disease in her maternal grandmother; Hypertension in her cousin and sister; Sudden death in her mother.  Allergies  Allergen Reactions   Metronidazole Itching and Rash       PHYSICAL EXAMINATION: Vital signs: BP (!) 148/80   Pulse 64   Ht 5' 4"  (1.626 m)   Wt 140 lb 12.8 oz (63.9 kg)   BMI 24.17 kg/m   Constitutional: generally well-appearing, no acute distress Psychiatric: alert and oriented x3, cooperative Eyes: extraocular movements intact, anicteric, conjunctiva pink Mouth: oral pharynx moist, no lesions Chest: Midline incision well-healed Neck: supple no lymphadenopathy Cardiovascular: Irregular rate and rhythm, soft murmur, mechanical heart sounds Lungs: clear to auscultation bilaterally Abdomen: soft, nontender, nondistended, no obvious ascites, no peritoneal signs, normal bowel sounds, no organomegaly Rectal: Deferred Extremities: no clubbing, cyanosis, or lower extremity edema bilaterally Skin: no  lesions on visible extremities Neuro: No focal deficits.  Cranial nerves intact  ASSESSMENT:  1.  Unexplained weight loss.  This is likely multifactorial and secondary to resolution of volume overload related to heart failure, weight loss associated with major surgery, and decrease caloric intake.  GI review of systems unremarkable.  Rule out occult process. 2.  History of adenomatous colon polyps and sessile serrated polyps.  Multiple prior colonoscopies.  Last examination 2017.  Due for follow-up this year 3.  Small hepatic cyst on ultrasound. 4.  Multiple significant medical problems 5.  Chronic anticoagulation therapy 6.  Anemia  PLAN:  1.  CBC today to follow-up anemia. 2.  Continue iron 3.  Schedule diagnostic upper endoscopy to evaluate weight loss.  Patient is high risk given her comorbidities and chronic anticoagulation status.The nature of the procedure, as well as the risks, benefits, and alternatives were carefully and thoroughly reviewed with the patient. Ample time for discussion and questions allowed. The patient understood, was satisfied, and agreed to proceed.  4.  Patient may stay on Coumadin 5.  Schedule CT of  the abdomen pelvis to evaluate unexplained weight loss. 6.  Colonoscopy sometime this year.  Hold off for now she continues to recover from surgery. ADDENDUM: The patient's blood work has returned.  Hemoglobin 11.8.  Improved.  She was notified  A total time of 60 minutes was spent preparing to see the patient, reviewing laboratories, x-rays, operative reports, pathology, endoscopy reports.  Also obtain comprehensive history and performing medically appropriate physical examination.  In addition, counseling the patient regarding her above listed issues and ordering blood work, advanced radiology, and endoscopic procedure.  Finally, documenting clinical information in the health record

## 2021-01-23 ENCOUNTER — Encounter: Payer: Self-pay | Admitting: Internal Medicine

## 2021-01-23 ENCOUNTER — Ambulatory Visit (AMBULATORY_SURGERY_CENTER): Payer: Medicare HMO | Admitting: Internal Medicine

## 2021-01-23 ENCOUNTER — Other Ambulatory Visit: Payer: Self-pay

## 2021-01-23 VITALS — BP 154/69 | HR 57 | Temp 97.0°F | Resp 15 | Ht 64.0 in | Wt 140.0 lb

## 2021-01-23 DIAGNOSIS — D649 Anemia, unspecified: Secondary | ICD-10-CM

## 2021-01-23 DIAGNOSIS — R634 Abnormal weight loss: Secondary | ICD-10-CM

## 2021-01-23 DIAGNOSIS — I10 Essential (primary) hypertension: Secondary | ICD-10-CM | POA: Diagnosis not present

## 2021-01-23 DIAGNOSIS — M069 Rheumatoid arthritis, unspecified: Secondary | ICD-10-CM | POA: Diagnosis not present

## 2021-01-23 MED ORDER — SODIUM CHLORIDE 0.9 % IV SOLN: 500.0000 mL | INTRAVENOUS | Status: DC

## 2021-01-23 MED ORDER — OMEPRAZOLE 20 MG PO CPDR
20.0000 mg | DELAYED_RELEASE_CAPSULE | Freq: Every day | ORAL | 11 refills | Status: DC
Start: 2021-01-23 — End: 2021-11-12

## 2021-01-23 NOTE — Op Note (Signed)
[place] Patient Name: Joanna Hood Procedure Date: 01/23/2021 2:38 PM MRN: [id number] Endoscopist: Docia Chuck. Henrene Pastor , MD Age: 75 Referring MD:  Date of Birth: June 23, 1945 Gender: Female Account #: [id number] Procedure:                Upper GI endoscopy Indications:              anemia, Weight loss Medicines:                Monitored Anesthesia Care Procedure:                Pre-Anesthesia Assessment:                           - Prior to the procedure, a History and Physical                            was performed, and patient medications and                            allergies were reviewed. The patient's tolerance of                            previous anesthesia was also reviewed. The risks                            and benefits of the procedure and the sedation                            options and risks were discussed with the patient.                            All questions were answered, and informed consent                            was obtained. Prior Anticoagulants: The patient has                            taken Coumadin (warfarin), last dose was 1 day                            prior to procedure. ASA Grade Assessment: III - A                            patient with severe systemic disease. After                            reviewing the risks and benefits, the patient was                            deemed in satisfactory condition to undergo the                            procedure.  After obtaining informed consent, the endoscope was                            passed under direct vision. Throughout the                            procedure, the patient's blood pressure, pulse, and                            oxygen saturations were monitored continuously. The                            GIF [id number] was introduced through the                            mouth, and advanced to the second part of duodenum.                             The upper GI endoscopy was accomplished without                            difficulty. The patient tolerated the procedure                            well. Scope In: Scope Out: Findings:                 The esophagus was normal.                           The stomach revealed a few superficial erosions.                            There was a small hiatal hernia. Stomach was                            otherwise normal                           The examined duodenum was normal.                           The cardia and gastric fundus were normal on                            retroflexion. Complications:            No immediate complications. Estimated Blood Loss:     Estimated blood loss: none. Impression:               1. Few superficial gastric erosions.                           2. Otherwise unremarkable EGD. Recommendation:           1. Patient has a contact number available for  emergencies. The signs and symptoms of potential                            delayed complications were discussed with the                            patient. Return to normal activities tomorrow.                            Written discharge instructions were provided to the                            patient.                           2. Resume previous diet.                           3. Continue present medications.                           4. Resume Coumadin today                           5. Prescribe omeprazole 20 mg daily; #30; 11                            refills. This medication will protect your stomach                            against inflammation and reduce the risk of                            gastrointestinal bleeding (particularly since you                            are on chronic anticoagulation therapy).                           6. Please make a follow-up appointment in my office                            in 2 to 3 months (as we discussed) to set up your                             surveillance colonoscopy. Docia Chuck. Henrene Pastor, MD 01/23/2021 2:56:58 PM This report has been signed electronically.

## 2021-01-23 NOTE — Progress Notes (Signed)
Report to PACU, RN, vss, BBS= Clear.  

## 2021-01-23 NOTE — Patient Instructions (Addendum)
YOU HAD AN ENDOSCOPIC PROCEDURE TODAY AT [facility]:   Refer to the procedure report that was given to you for any specific questions about what was found during the examination.  If the procedure report does not answer your questions, please call your gastroenterologist to clarify.  If you requested that your care partner not be given the details of your procedure findings, then the procedure report has been included in a sealed envelope for you to review at your convenience later.  YOU SHOULD EXPECT: Some feelings of bloating in the abdomen. Passage of more gas than usual.  Walking can help get rid of the air that was put into your GI tract during the procedure and reduce the bloating. If you had a lower endoscopy (such as a colonoscopy or flexible sigmoidoscopy) you may notice spotting of blood in your stool or on the toilet paper. If you underwent a bowel prep for your procedure, you may not have a normal bowel movement for a few days.  Please Note:  You might notice some irritation and congestion in your nose or some drainage.  This is from the oxygen used during your procedure.  There is no need for concern and it should clear up in a day or so.  SYMPTOMS TO REPORT IMMEDIATELY:   Following upper endoscopy (EGD)  Vomiting of blood or coffee ground material  New chest pain or pain under the shoulder blades  Painful or persistently difficult swallowing  New shortness of breath  Fever of 100F or higher  Black, tarry-looking stools  For urgent or emergent issues, a gastroenterologist can be reached at any hour by calling [phone]. Do not use MyChart messaging for urgent concerns.    DIET:  We do recommend a small meal at first, but then you may proceed to your regular diet.  Drink plenty of fluids but you should avoid alcoholic beverages for 24 hours.  MEDICATIONS: Continue present medications. Start Omeprazole 20 mg by mouth daily (#30 with 11 refills), this  prescription has been sent electronically to your pharmacy of choice. This medication will protect your stomach against inflammation and reduce the risk of gastrointestinal bleeding (especially since you are on chronic anticoagulation therapy). Resume Coumadin as previously prescribed.  Please make a follow-up appointment in Dr. Blanch Media office in 2 to 3 months (as Dr. Henrene Pastor discussed with you) to set up your surveillance  colonoscopy.  Thank you for allowing [place] to provide for your healthcare needs today.  ACTIVITY:  You should plan to take it easy for the rest of today and you should NOT DRIVE or use heavy machinery until tomorrow (because of the sedation medicines used during the test).    FOLLOW UP: Our staff will call the number listed on your records 48-72 hours following your procedure to check on you and address any questions or concerns that you may have regarding the information given to you following your procedure. If we do not reach you, we will leave a message.  We will attempt to reach you two times.  During this call, we will ask if you have developed any symptoms of COVID 19. If you develop any symptoms (ie: fever, flu-like symptoms, shortness of breath, cough etc.) before then, please call [phone].  If you test positive for Covid 19 in the 2 weeks post procedure, please call and report this information to [place].    If any biopsies were taken you will be contacted by phone or by letter within  the next 1-3 weeks.  Please call us at [phone] if you have not heard about the biopsies in 3 weeks.    SIGNATURES/CONFIDENTIALITY: You and/or your care partner have signed paperwork which will be entered into your electronic medical record.  These signatures attest to the fact that that the information above on your After Visit Summary has been reviewed and is understood.  Full responsibility of the confidentiality of this discharge information lies with you and/or your care-partner.

## 2021-01-23 NOTE — Progress Notes (Signed)
Vs CW ° °

## 2021-01-27 ENCOUNTER — Telehealth: Payer: Self-pay | Admitting: *Deleted

## 2021-01-27 ENCOUNTER — Telehealth: Payer: Self-pay

## 2021-01-27 NOTE — Telephone Encounter (Signed)
Attempted f/u call. No answer, VM not setup. 

## 2021-01-27 NOTE — Telephone Encounter (Signed)
Message left

## 2021-01-28 ENCOUNTER — Ambulatory Visit (INDEPENDENT_AMBULATORY_CARE_PROVIDER_SITE_OTHER)
Admission: RE | Admit: 2021-01-28 | Discharge: 2021-01-28 | Disposition: A | Discharge status: Home / Self Care | Payer: Medicare HMO | Source: Ambulatory Visit | Attending: Internal Medicine | Admitting: Internal Medicine

## 2021-01-28 ENCOUNTER — Encounter (HOSPITAL_COMMUNITY)
Admission: RE | Admit: 2021-01-28 | Discharge: 2021-01-28 | Disposition: A | Discharge status: Home / Self Care | Payer: Medicare HMO | Source: Ambulatory Visit | Attending: Cardiovascular Disease | Admitting: Cardiovascular Disease

## 2021-01-28 ENCOUNTER — Other Ambulatory Visit: Payer: Self-pay

## 2021-01-28 ENCOUNTER — Encounter (HOSPITAL_COMMUNITY): Payer: Self-pay

## 2021-01-28 VITALS — BP 172/80 | HR 82 | Ht 62.75 in | Wt 144.0 lb

## 2021-01-28 DIAGNOSIS — Z952 Presence of prosthetic heart valve: Secondary | ICD-10-CM | POA: Insufficient documentation

## 2021-01-28 DIAGNOSIS — R630 Anorexia: Secondary | ICD-10-CM | POA: Diagnosis not present

## 2021-01-28 DIAGNOSIS — R634 Abnormal weight loss: Secondary | ICD-10-CM | POA: Diagnosis not present

## 2021-01-28 DIAGNOSIS — D649 Anemia, unspecified: Secondary | ICD-10-CM | POA: Diagnosis not present

## 2021-01-28 DIAGNOSIS — K8020 Calculus of gallbladder without cholecystitis without obstruction: Secondary | ICD-10-CM | POA: Diagnosis not present

## 2021-01-28 DIAGNOSIS — K7689 Other specified diseases of liver: Secondary | ICD-10-CM

## 2021-01-28 MED ORDER — IOHEXOL 300 MG/ML  SOLN
100.0000 mL | Freq: Once | INTRAMUSCULAR | Status: AC | PRN
  Administered 2021-01-28: 100 mL via INTRAVENOUS

## 2021-01-28 NOTE — Progress Notes (Signed)
Cardiac Rehab Medication Review by a Nurse  Does the patient  feel that his/her medications are working for him/her?  yes  Has the patient been experiencing any side effects to the medications prescribed?  yes  Does the patient measure his/her own blood pressure or blood glucose at home?  yes   Does the patient have any problems obtaining medications due to transportation or finances?   no  Understanding of regimen: good Understanding of indications: good Potential of compliance: good    Nurse comments: Joanna Hood says she is taking her medications as prescribed and has a good understanding of what her medications are for. Joanna Hood says she monitors her blood pressures at home and that her blood pressures are in the 130's to 140's.    Christa See Joanna Shryock RN 01/28/2021 9:53 AM

## 2021-01-28 NOTE — Progress Notes (Signed)
Cardiac Individual Treatment Plan  Patient Details  Name: Joanna Hood MRN: [id number] Date of Birth: 1945/08/31 Referring Provider:   Flowsheet Row CARDIAC REHAB PHASE II ORIENTATION from 01/28/2021 in [place]  Referring Provider Lorretta Harp, MD       Initial Encounter Date:  [place] from 01/28/2021 in [place]  Date 01/28/21       Visit Diagnosis: 10/28/20 MVR (mitral valve replacement)  Patient's Home Medications on Admission:  Current Outpatient Medications:    acetaminophen (TYLENOL) 325 MG tablet, Take 650 mg by mouth every 6 (six) hours as needed (pain)., Disp: , Rfl:    aspirin EC 81 MG tablet, Take 81 mg by mouth daily. Swallow whole., Disp: , Rfl:    Calcium Carb-Cholecalciferol (CALCIUM-VITAMIN D) 600-400 MG-UNIT TABS, Take 1 tablet by mouth daily., Disp: , Rfl:    cholecalciferol (VITAMIN D) 1000 UNITS tablet, Take 1,000 Units by mouth daily., Disp: , Rfl:    ferrous sulfate 325 (65 FE) MG tablet, Take 1 tablet (325 mg total) by mouth every Monday, Wednesday, and Friday., Disp: , Rfl:    folic acid (FOLVITE) 1 MG tablet, Take 1 tablet (1 mg total) by mouth daily., Disp: 90 tablet, Rfl: 3   furosemide (LASIX) 20 MG tablet, Take 10 mg by mouth daily., Disp: , Rfl:    levothyroxine (SYNTHROID) 125 MCG tablet, Take 1 tablet (125 mcg total) by mouth daily before breakfast., Disp: 90 tablet, Rfl: 3   methotrexate (RHEUMATREX) 2.5 MG tablet, Take 15 mg by mouth every Friday. Caution:Chemotherapy. Protect from light., Disp: , Rfl:    omeprazole (PRILOSEC) 20 MG capsule, Take 1 capsule (20 mg total) by mouth daily., Disp: 30 capsule, Rfl: 11   sacubitril-valsartan (ENTRESTO) 97-103 MG, Take 1 tablet by mouth 2 (two) times daily., Disp: 60 tablet, Rfl: 6   spironolactone (ALDACTONE) 25 MG tablet, Take 1 tablet (25 mg total) by mouth daily., Disp: 30 tablet, Rfl:  3   warfarin (COUMADIN) 4 MG tablet, Take 1 tablet (4 mg total) by mouth daily at 4 PM. (Patient taking differently: Take 2-4 mg by mouth See admin instructions. Take 1/2 tablet on Wednesday then take 1 tablet all the other days), Disp: 30 tablet, Rfl: 1  Current Facility-Administered Medications:    0.9 %  sodium chloride infusion, 500 mL, Intravenous, Continuous, Irene Shipper, MD  Past Medical History: Past Medical History:  Diagnosis Date   Anemia    Atrial fibrillation [facility])    Atrial fibrillation with RVR ([place]) 2014   persistent   Chronic diastolic CHF (congestive heart failure) (HCC)    HTN   Chronic diastolic congestive heart failure ([place])    COVID-19 virus infection 01/20/2019   ESOPHAGEAL STRICTURE 02/19/2005   Qualifier: Diagnosis of  By: Amil Amen MD, Caleen Essex    Heart murmur    History of anemia    unclear cause, now resolved   History of chicken pox    History of colon polyps    benign   HLD (hyperlipidemia)    Hypothyroidism    Malignant hypertension longstanding   Microcytosis    Mitral valve regurgitation    Osteopenia 12/2013   T -2.3 hip   Osteoporosis    Pulmonary hypertension (HCC)    Rheumatoid arthritis ([place]) 11/2015   +RF, +CCP, ESR 49, synovitis on exam and [place] 11/2015 [facility])   Right lower lobe  pneumonia 07/24/2014   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   29 mm Medtronic Mosaic stented porcine bioprosthetic tissue valve   Tricuspid regurgitation    Vitamin D deficiency     Tobacco Use: Social History   Tobacco Use  Smoking Status Former   Types: Cigarettes   Quit date: 05/16/1969   Years since quitting: 51.7  Smokeless Tobacco Former    Labs: Recent Government social research officer for ITP Cardiac and Pulmonary Rehab Latest Ref Rng & Units 11/04/2020 11/05/2020 11/06/2020 11/06/2020 11/07/2020   Cholestrol 0 - 200 mg/dL - - - - -   LDLCALC 0 - 99 mg/dL - - - - -   HDL >39.00 mg/dL - - - - -    Trlycerides 0.0 - 149.0 mg/dL - - - - -   Hemoglobin A1c 4.8 - 5.6 % - - - - -   PHART 7.350 - 7.450 - - - - -   PCO2ART 32.0 - 48.0 mmHg - - - - -   HCO3 20.0 - 28.0 mmol/L - - - - -   TCO2 22 - 32 mmol/L - - - - -   ACIDBASEDEF 0.0 - 2.0 mmol/L - - - - -   O2SAT % 60.7 60.3 48.4 47.0 49.7       Capillary Blood Glucose: Lab Results  Component Value Date   GLUCAP 120 (H) 10/30/2020   GLUCAP 117 (H) 10/30/2020   GLUCAP 156 (H) 10/30/2020   GLUCAP 126 (H) 10/30/2020   GLUCAP 119 (H) 10/29/2020     Exercise Target Goals: Exercise Program Goal: Individual exercise prescription set using results from initial 6 min walk test and THRR while considering  patient's activity barriers and safety.   Exercise Prescription Goal: Starting with aerobic activity 30 plus minutes a day, 3 days per week for initial exercise prescription. Provide home exercise prescription and guidelines that participant acknowledges understanding prior to discharge.  Activity Barriers & Risk Stratification:  Activity Barriers & Cardiac Risk Stratification - 01/28/21 0958       Activity Barriers & Cardiac Risk Stratification   Activity Barriers Arthritis;Other (comment)    Comments Rheumatoid arthritis. Left hip pain, occassionally "gives out".    Cardiac Risk Stratification High             6 Minute Walk:  6 Minute Walk     Row Name 01/28/21 1009         6 Minute Walk   Phase Initial     Distance 1138 feet     Walk Time 6 minutes     # of Rest Breaks 0     MPH 2.15     METS 3.19     RPE 11     Perceived Dyspnea  0     VO2 Peak 11.16     Symptoms No     Resting HR 82 bpm     Resting BP 172/80     Resting Oxygen Saturation  99 %     Exercise Oxygen Saturation  during 6 min walk 93 %     Max Ex. HR 119 bpm     Max Ex. BP 192/78     2 Minute Post BP 176/88              Oxygen Initial Assessment:   Oxygen Re-Evaluation:   Oxygen Discharge (Final Oxygen  Re-Evaluation):   Initial Exercise Prescription:  Initial Exercise Prescription - 01/28/21 1000  Date of Initial Exercise RX and Referring Provider   Date 01/28/21    Referring Provider Lorretta Harp, MD    Expected Discharge Date 03/28/21      NuStep   Level 2    SPM 75    Minutes 15    METs 2      Track   Laps 13    Minutes 15    METs 2.51      Prescription Details   Frequency (times per week) 3    Duration Progress to 30 minutes of continuous aerobic without signs/symptoms of physical distress      Intensity   THRR 40-80% of Max Heartrate 59-118    Ratings of Perceived Exertion 11-13    Perceived Dyspnea 0-4      Progression   Progression Continue to progress workloads to maintain intensity without signs/symptoms of physical distress.      Resistance Training   Training Prescription Yes    Weight 2 lbs    Reps 10-15             Perform Capillary Blood Glucose checks as needed.  Exercise Prescription Changes:   Exercise Comments:   Exercise Goals and Review:   Exercise Goals     Row Name 01/28/21 0939             Exercise Goals   Increase Physical Activity Yes       Intervention Provide advice, education, support and counseling about physical activity/exercise needs.;Develop an individualized exercise prescription for aerobic and resistive training based on initial evaluation findings, risk stratification, comorbidities and participant's personal goals.       Expected Outcomes Short Term: Attend rehab on a regular basis to increase amount of physical activity.;Long Term: Exercising regularly at least 3-5 days a week.;Long Term: Add in home exercise to make exercise part of routine and to increase amount of physical activity.       Increase Strength and Stamina Yes       Intervention Provide advice, education, support and counseling about physical activity/exercise needs.;Develop an individualized exercise prescription for aerobic and  resistive training based on initial evaluation findings, risk stratification, comorbidities and participant's personal goals.       Expected Outcomes Short Term: Increase workloads from initial exercise prescription for resistance, speed, and METs.;Short Term: Perform resistance training exercises routinely during rehab and add in resistance training at home;Long Term: Improve cardiorespiratory fitness, muscular endurance and strength as measured by increased METs and functional capacity (6MWT)       Able to understand and use rate of perceived exertion (RPE) scale Yes       Intervention Provide education and explanation on how to use RPE scale       Expected Outcomes Short Term: Able to use RPE daily in rehab to express subjective intensity level;Long Term:  Able to use RPE to guide intensity level when exercising independently       Knowledge and understanding of Target Heart Rate Range (THRR) Yes       Intervention Provide education and explanation of THRR including how the numbers were predicted and where they are located for reference       Expected Outcomes Short Term: Able to state/look up THRR;Long Term: Able to use THRR to govern intensity when exercising independently;Short Term: Able to use daily as guideline for intensity in rehab       Able to check pulse independently Yes       Intervention Provide education  and demonstration on how to check pulse in carotid and radial arteries.;Review the importance of being able to check your own pulse for safety during independent exercise       Expected Outcomes Short Term: Able to explain why pulse checking is important during independent exercise;Long Term: Able to check pulse independently and accurately       Understanding of Exercise Prescription Yes       Intervention Provide education, explanation, and written materials on patient's individual exercise prescription       Expected Outcomes Short Term: Able to explain program exercise  prescription;Long Term: Able to explain home exercise prescription to exercise independently                Exercise Goals Re-Evaluation :    Discharge Exercise Prescription (Final Exercise Prescription Changes):   Nutrition:  Target Goals: Understanding of nutrition guidelines, daily intake of sodium <1581m, cholesterol <2063m calories 30% from fat and 7% or less from saturated fats, daily to have 5 or more servings of fruits and vegetables.  Biometrics:  Pre Biometrics - 01/28/21 0937       Pre Biometrics   Waist Circumference 35.75 inches    Hip Circumference 40 inches    Waist to Hip Ratio 0.89 %    Triceps Skinfold 30 mm    % Body Fat 39.2 %    Grip Strength 18 kg    Flexibility 16 in    Single Leg Stand 1.53 seconds              Nutrition Therapy Plan and Nutrition Goals:   Nutrition Assessments:  MEDIFICTS Score Key: ?70 Need to make dietary changes  40-70 Heart Healthy Diet ? 40 Therapeutic Level Cholesterol Diet   Picture Your Plate Scores: <4<35nhealthy dietary pattern with much room for improvement. 41-50 Dietary pattern unlikely to meet recommendations for good health and room for improvement. 51-60 More healthful dietary pattern, with some room for improvement.  >60 Healthy dietary pattern, although there may be some specific behaviors that could be improved.    Nutrition Goals Re-Evaluation:   Nutrition Goals Discharge (Final Nutrition Goals Re-Evaluation):   Psychosocial: Target Goals: Acknowledge presence or absence of significant depression and/or stress, maximize coping skills, provide positive support system. Participant is able to verbalize types and ability to use techniques and skills needed for reducing stress and depression.  Initial Review & Psychosocial Screening:  Initial Psych Review & Screening - 01/28/21 1228       Initial Review   Current issues with None Identified      Family Dynamics   Good Support System?  Yes   Joanna Hood lives with her daughter and great grand children     Barriers   Psychosocial barriers to participate in program There are no identifiable barriers or psychosocial needs.      Screening Interventions   Interventions Encouraged to exercise             Quality of Life Scores:  Quality of Life - 01/28/21 1102       Quality of Life   Select Quality of Life      Quality of Life Scores   Health/Function Pre 29.1 %    Socioeconomic Pre 27 %    Psych/Spiritual Pre 29.14 %    Family Pre 27.6 %    GLOBAL Pre 28.4 %            Scores of 19 and below usually indicate a poorer quality of  life in these areas.  A difference of  2-3 points is a clinically meaningful difference.  A difference of 2-3 points in the total score of the Quality of Life Index has been associated with significant improvement in overall quality of life, self-image, physical symptoms, and general health in studies assessing change in quality of life.  PHQ-9: Recent Review Flowsheet Data     Depression screen [facility] 2/9 01/28/2021 09/04/2020 06/28/2020 06/02/2019 05/26/2018   Decreased Interest 0 0 0 0 0   Down, Depressed, Hopeless 0 0 0 0 0   PHQ - 2 Score 0 0 0 0 0   Altered sleeping - 0 - 0 0   Tired, decreased energy - 0 - 0 0   Change in appetite - 0 - 0 0   Feeling bad or failure about yourself  - 0 - 0 0   Trouble concentrating - 0 - 0 0   Moving slowly or fidgety/restless - 0 - 0 0   Suicidal thoughts - 0 - 0 0   PHQ-9 Score - 0 - 0 0   Difficult doing work/chores - Not difficult at all - Not difficult at all Not difficult at all      Interpretation of Total Score  Total Score Depression Severity:  1-4 = Minimal depression, 5-9 = Mild depression, 10-14 = Moderate depression, 15-19 = Moderately severe depression, 20-27 = Severe depression   Psychosocial Evaluation and Intervention:   Psychosocial Re-Evaluation:   Psychosocial Discharge (Final Psychosocial Re-Evaluation):   Vocational  Rehabilitation: Provide vocational rehab assistance to qualifying candidates.   Vocational Rehab Evaluation & Intervention:  Vocational Rehab - 01/28/21 1230       Initial Vocational Rehab Evaluation & Intervention   Assessment shows need for Vocational Rehabilitation No   Joanna Hood is retired and does not need vocational rehab at this time            Education: Education Goals: Education classes will be provided on a weekly basis, covering required topics. Participant will state understanding/return demonstration of topics presented.  Learning Barriers/Preferences:  Learning Barriers/Preferences - 01/28/21 1229       Learning Barriers/Preferences   Learning Barriers Sight   Wears glasses   Learning Preferences Pictoral;Skilled Demonstration             Education Topics: Hypertension, Hypertension Reduction -Define heart disease and high blood pressure. Discus how high blood pressure affects the body and ways to reduce high blood pressure.   Exercise and Your Heart -Discuss why it is important to exercise, the FITT principles of exercise, normal and abnormal responses to exercise, and how to exercise safely.   Angina -Discuss definition of angina, causes of angina, treatment of angina, and how to decrease risk of having angina.   Cardiac Medications -Review what the following cardiac medications are used for, how they affect the body, and side effects that may occur when taking the medications.  Medications include Aspirin, Beta blockers, calcium channel blockers, ACE Inhibitors, angiotensin receptor blockers, diuretics, digoxin, and antihyperlipidemics.   Congestive Heart Failure -Discuss the definition of CHF, how to live with CHF, the signs and symptoms of CHF, and how keep track of weight and sodium intake.   Heart Disease and Intimacy -Discus the effect sexual activity has on the heart, how changes occur during intimacy as we age, and safety during sexual  activity.   Smoking Cessation / COPD -Discuss different methods to quit smoking, the health benefits of quitting smoking, and the definition  of COPD.   Nutrition I: Fats -Discuss the types of cholesterol, what cholesterol does to the heart, and how cholesterol levels can be controlled.   Nutrition II: Labels -Discuss the different components of food labels and how to read food label   Heart Parts/Heart Disease and PAD -Discuss the anatomy of the heart, the pathway of blood circulation through the heart, and these are affected by heart disease.   Stress I: Signs and Symptoms -Discuss the causes of stress, how stress may lead to anxiety and depression, and ways to limit stress.   Stress II: Relaxation -Discuss different types of relaxation techniques to limit stress.   Warning Signs of Stroke / TIA -Discuss definition of a stroke, what the signs and symptoms are of a stroke, and how to identify when someone is having stroke.   Knowledge Questionnaire Score:  Knowledge Questionnaire Score - 01/28/21 1100       Knowledge Questionnaire Score   Pre Score 21/24             Core Components/Risk Factors/Patient Goals at Admission:  Personal Goals and Risk Factors at Admission - 01/28/21 0938       Core Components/Risk Factors/Patient Goals on Admission    Weight Management Yes;Weight Maintenance    Intervention Weight Management: Develop a combined nutrition and exercise program designed to reach desired caloric intake, while maintaining appropriate intake of nutrient and fiber, sodium and fats, and appropriate energy expenditure required for the weight goal.;Weight Management: Provide education and appropriate resources to help participant work on and attain dietary goals.    Admit Weight 143 lb 15.4 oz (65.3 kg)    Goal Weight: Short Term 140 lb (63.5 kg)    Goal Weight: Long Term 140 lb (63.5 kg)    Expected Outcomes Short Term: Continue to assess and modify  interventions until short term weight is achieved;Long Term: Adherence to nutrition and physical activity/exercise program aimed toward attainment of established weight goal;Weight Maintenance: Understanding of the daily nutrition guidelines, which includes 25-35% calories from fat, 7% or less cal from saturated fats, less than 232m cholesterol, less than 1.5gm of sodium, & 5 or more servings of fruits and vegetables daily    Heart Failure Yes    Intervention Provide a combined exercise and nutrition program that is supplemented with education, support and counseling about heart failure. Directed toward relieving symptoms such as shortness of breath, decreased exercise tolerance, and extremity edema.    Expected Outcomes Improve functional capacity of life;Short term: Attendance in program 2-3 days a week with increased exercise capacity. Reported lower sodium intake. Reported increased fruit and vegetable intake. Reports medication compliance.;Short term: Daily weights obtained and reported for increase. Utilizing diuretic protocols set by physician.;Long term: Adoption of self-care skills and reduction of barriers for early signs and symptoms recognition and intervention leading to self-care maintenance.    Hypertension Yes    Intervention Provide education on lifestyle modifcations including regular physical activity/exercise, weight management, moderate sodium restriction and increased consumption of fresh fruit, vegetables, and low fat dairy, alcohol moderation, and smoking cessation.;Monitor prescription use compliance.    Expected Outcomes Short Term: Continued assessment and intervention until BP is < 140/937mHG in hypertensive participants. < 130/8019mG in hypertensive participants with diabetes, heart failure or chronic kidney disease.;Long Term: Maintenance of blood pressure at goal levels.    Stress Yes    Intervention Offer individual and/or small group education and counseling on adjustment  to heart disease, stress management and health-related  lifestyle change. Teach and support self-help strategies.;Refer participants experiencing significant psychosocial distress to appropriate mental health specialists for further evaluation and treatment. When possible, include family members and significant others in education/counseling sessions.    Expected Outcomes Short Term: Participant demonstrates changes in health-related behavior, relaxation and other stress management skills, ability to obtain effective social support, and compliance with psychotropic medications if prescribed.;Long Term: Emotional wellbeing is indicated by absence of clinically significant psychosocial distress or social isolation.    Personal Goal Other Yes    Personal Goal Be able to walk more comfortably.    Intervention Increase walking duration at home in addition to walking at cardiac rehab to build walking tolerance.    Expected Outcomes Patient will be able to walk more comforatbly. Patient's distance on 6-minute walk test will increase.             Core Components/Risk Factors/Patient Goals Review:    Core Components/Risk Factors/Patient Goals at Discharge (Final Review):    ITP Comments:  ITP Comments     Row Name 01/28/21 0938           ITP Comments Medical Director- Dr. Fransico Him, MD                Comments: Rubylee attended orientation on 01/28/2021 to review rules and guidelines for program.  Completed 6 minute walk test, Intitial ITP, and exercise prescription. Resting and exertional BP's were elevated today please see previous note in EPIC. Telemetry-Chronic Atrial Fibrillation .  Asymptomatic. Safety measures and social distancing in place per CDC guidelines. Patient was instructed to hold off on exercise until after seen at the heart failure clinic by Dr [name].Barnet Pall, RN,BSN 01/28/2021 12:48 PM

## 2021-01-28 NOTE — Progress Notes (Addendum)
Blood pressures from cardiac rehab are as follows.  Heart rate 82 Blood pressure 172/80 recheck BP 142/84 Proceeded with 6 minute walk test. HR 119 BP 192/78 post walk test patient asymptomatic.  2 minute post walk test HR 74 BP 176/88   Roby Lofts PAC paged and notified about elevated BP. No new order received. Medications reviewed this AM patient reported taking her medications prior to coming to cardiac rehab orientation. Will hold off on beginning exercise until seen by Dr [name] on 02/06/21. Daleen Snook instructed the patient to keep her upcoming appointments. Will forward today's BP's to Dr Gwenlyn Found, Dr [name] and Dr Danise Mina. Patient agreeable to the plan.  Recheck BP 160/90  Exit BP 152/78  Barnet Pall, RN,BSN 01/28/2021 11:47 AM

## 2021-01-29 ENCOUNTER — Telehealth (HOSPITAL_COMMUNITY): Payer: Self-pay | Admitting: *Deleted

## 2021-01-29 NOTE — Telephone Encounter (Signed)
-----   Message from Jolaine Artist, MD sent at 01/28/2021  6:26 PM EDT ----- Regarding: RE: Elevated BP's at cardiac rehab oreintation Let's just add amlodipine '10mg'$  daily and see how she does.   [name] - can you call this in for her?   Thanks  ----- Message ----- From: Magda Kiel, RN Sent: 01/28/2021  11:54 AM EDT To: Jolaine Artist, MD, Lorretta Harp, MD, # Subject: Elevated BP's at cardiac rehab oreintation     Good morning Dr [name], Dr Gwenlyn Found and Dr Danise Mina,   Joanna Hood attended cardiac rehab orientation this morning. Blood pressures from cardiac rehab are as follows.   Heart rate 82 Blood pressure 172/80 recheck BP 142/84 Proceeded with 6 minute walk test. HR 119 BP 192/78 post walk test patient asymptomatic. 2 minute post walk test HR 74 BP 176/88  Roby Lofts PAC paged and notified about elevated BP. No new orders received. Medications reviewed this AM patient reported taking her medications prior to coming to cardiac rehab orientation. Will hold off on beginning exercise until seen by Dr [name] on 02/06/21. Daleen Snook instructed the patient to keep her upcoming appointments.  Recheck BP 160/90 Exit BP 152/78 after rest.  Thanks for your input!  Sincerely, Barnet Pall RN Cardiac Rehab

## 2021-01-30 MED ORDER — AMLODIPINE BESYLATE 10 MG PO TABS: 10.0000 mg | ORAL_TABLET | Freq: Every day | ORAL | 3 refills | Status: DC

## 2021-01-30 NOTE — Addendum Note (Signed)
Addended by: Scarlette Calico on: 01/30/2021 02:03 PM   Modules accepted: Orders

## 2021-01-30 NOTE — Telephone Encounter (Signed)
Pt aware and agreeable with Dr Bensimhon's recommendation, rx sent in, pt has f/u appt on 8/18

## 2021-01-31 ENCOUNTER — Other Ambulatory Visit: Payer: Self-pay

## 2021-01-31 ENCOUNTER — Ambulatory Visit: Payer: Medicare HMO

## 2021-01-31 DIAGNOSIS — I4819 Other persistent atrial fibrillation: Secondary | ICD-10-CM

## 2021-01-31 DIAGNOSIS — Z953 Presence of xenogenic heart valve: Secondary | ICD-10-CM | POA: Diagnosis not present

## 2021-01-31 DIAGNOSIS — Z7901 Long term (current) use of anticoagulants: Secondary | ICD-10-CM

## 2021-01-31 LAB — POCT INR: INR: 2.4 (ref 2.0–3.0)

## 2021-01-31 NOTE — Patient Instructions (Signed)
Continue taking 1 tablet Daily, except 0.5 on Wednesday  Recheck 7 weeks.   [phone]

## 2021-02-03 ENCOUNTER — Encounter (HOSPITAL_COMMUNITY): Payer: Medicare HMO

## 2021-02-04 ENCOUNTER — Other Ambulatory Visit: Payer: Self-pay | Admitting: Family Medicine

## 2021-02-05 ENCOUNTER — Encounter (HOSPITAL_COMMUNITY): Payer: Medicare HMO

## 2021-02-06 ENCOUNTER — Encounter (HOSPITAL_COMMUNITY): Payer: Self-pay | Admitting: Internal Medicine

## 2021-02-06 ENCOUNTER — Other Ambulatory Visit: Payer: Self-pay

## 2021-02-06 ENCOUNTER — Ambulatory Visit (HOSPITAL_COMMUNITY)
Admission: RE | Admit: 2021-02-06 | Discharge: 2021-02-06 | Disposition: A | Discharge status: Home / Self Care | Payer: Medicare HMO | Source: Ambulatory Visit | Attending: Internal Medicine | Admitting: Internal Medicine

## 2021-02-06 VITALS — BP 134/67 | HR 64 | Wt 142.8 lb

## 2021-02-06 DIAGNOSIS — Z7901 Long term (current) use of anticoagulants: Secondary | ICD-10-CM | POA: Diagnosis not present

## 2021-02-06 DIAGNOSIS — I10 Essential (primary) hypertension: Secondary | ICD-10-CM

## 2021-02-06 DIAGNOSIS — Z8249 Family history of ischemic heart disease and other diseases of the circulatory system: Secondary | ICD-10-CM | POA: Diagnosis not present

## 2021-02-06 DIAGNOSIS — I4819 Other persistent atrial fibrillation: Secondary | ICD-10-CM

## 2021-02-06 DIAGNOSIS — Z8616 Personal history of COVID-19: Secondary | ICD-10-CM | POA: Diagnosis not present

## 2021-02-06 DIAGNOSIS — Z953 Presence of xenogenic heart valve: Secondary | ICD-10-CM | POA: Insufficient documentation

## 2021-02-06 DIAGNOSIS — I5032 Chronic diastolic (congestive) heart failure: Secondary | ICD-10-CM | POA: Diagnosis not present

## 2021-02-06 DIAGNOSIS — Z87891 Personal history of nicotine dependence: Secondary | ICD-10-CM | POA: Insufficient documentation

## 2021-02-06 DIAGNOSIS — I051 Rheumatic mitral insufficiency: Secondary | ICD-10-CM | POA: Diagnosis not present

## 2021-02-06 DIAGNOSIS — M069 Rheumatoid arthritis, unspecified: Secondary | ICD-10-CM | POA: Insufficient documentation

## 2021-02-06 DIAGNOSIS — I110 Hypertensive heart disease with heart failure: Secondary | ICD-10-CM | POA: Insufficient documentation

## 2021-02-06 DIAGNOSIS — I482 Chronic atrial fibrillation, unspecified: Secondary | ICD-10-CM | POA: Diagnosis not present

## 2021-02-06 DIAGNOSIS — Z7982 Long term (current) use of aspirin: Secondary | ICD-10-CM | POA: Insufficient documentation

## 2021-02-06 DIAGNOSIS — I272 Pulmonary hypertension, unspecified: Secondary | ICD-10-CM | POA: Diagnosis not present

## 2021-02-06 DIAGNOSIS — I5022 Chronic systolic (congestive) heart failure: Secondary | ICD-10-CM | POA: Diagnosis not present

## 2021-02-06 MED ORDER — AMOXICILLIN 500 MG PO TABS: 2000.0000 mg | ORAL_TABLET | ORAL | 3 refills | Status: DC | PRN

## 2021-02-06 NOTE — Patient Instructions (Signed)
EKG was done today  Prescription was sent to your pharmacy to take antibiotic prior to dental work  Your physician recommends that you schedule a follow-up appointment in: 4 months  milAt the [facility], you and your health needs are our priority. As part of our continuing mission to provide you with exceptional heart care, we have created designated Provider Care Teams. These Care Teams include your primary Cardiologist (physician) and Advanced Practice Providers (APPs- Physician Assistants and Nurse Practitioners) who all work together to provide you with the care you need, when you need it.   You may see any of the following providers on your designated Care Team at your next follow up: Dr Glori Bickers Dr Loralie Champagne Dr Patrice Paradise, NP Lyda Jester, [place] Ginnie Smart Audry Riles, PharmD   Please be sure to bring in all your medications bottles to every appointment.    If you have any questions or concerns before your next appointment please send [place] a message through [place] or call our office at [phone].    TO LEAVE A MESSAGE FOR THE NURSE SELECT OPTION 2, PLEASE LEAVE A MESSAGE INCLUDING: YOUR NAME DATE OF BIRTH CALL BACK NUMBER REASON FOR CALL**this is important as we prioritize the call backs  YOU WILL RECEIVE A CALL BACK THE SAME DAY AS LONG AS YOU CALL BEFORE 4:00 PM

## 2021-02-06 NOTE — Progress Notes (Addendum)
ADVANCED HF CLINIC NOTE  PCP: Dr Joanna Hood Primary Cardiologist: Dr Joanna Hood  HF MD: Dr Joanna Hood   HPI:  [name], followed by Dr. Gwenlyn Hood, w/ h/o chronic diastolic heart failure w/ predominant RV dysfunction + pulmonary hypertension, chronic atrial fibrillation on Eliquis, severe mitral regurgitation, systemic HTN and rheumatoid arthritis on Methotrexate s/p bioprosthetic MVR with clipping atrial appendage 5/22  Had Echo 8/21 showed normal LVEF, 60-65%, w/ mild LVH and G3DD c/w restrictive physiology. RV was mildly reduced at the time w/ severely elevated pulmonary artery pressure at 78 mmHg. LA size was severely dilated, RA mildly dilated. Also w/ findings c/w severe MR.  Developed increased shortness of breath and was set up for TEE/cath to further evaluate mitral valve. Admitted 10/21/2020 for valve work up. TEE with severe MR. LHC with normal cors and RHC with elevated PA pressures/PVR. Optimized with milrinone and on 10/28/20 underwent bioprosthetic MVR and clipping atrial  appendage. Milrinone gradually weaned off. Started on GDMT but no bb due to low CO-OX.  Echo 12/16/20 EF 50-55% MVR ok RV mildly down   Today she returns for post hospital follow up with her daughter. Was referred to CR but went to orientation and BP was elevated so not allowed to start. Amlodipine added. BP better. Will start Monday. Taking BP regularly at home. BP up and down.  But now SBP running mostly 120-130s. Breathing is better. No SOB, orthopnea or PND. No palpitations.   Cardiac Testing 10/21/20 RHC/LHC normal cors,  10/28/20 S/P bioprosthetic MVR Clipping atrial appendage.   10/28/20 ECHO EF 50% bioprosthetic MV (normal)   10/21/20 TEE LVEF normal. RV systolic function is now moderately reduced w/ mild-mod TR, in setting of severe MR.  ROS: All systems negative except as listed in HPI, PMH and Problem List.  SH:  Social History   Socioeconomic History   Marital status: Single    Spouse name: Not on file    Number of children: 1   Years of education: Not on file   Highest education level: Not on file  Occupational History   Occupation: Retired  Tobacco Use   Smoking status: Former    Types: Cigarettes    Quit date: 05/16/1969    Years since quitting: 51.7   Smokeless tobacco: Former  Scientific laboratory technician Use: Never used  Substance and Sexual Activity   Alcohol use: No    Alcohol/week: 0.0 standard drinks   Drug use: No   Sexual activity: Not Currently  Other Topics Concern   Not on file  Social History Narrative   Lives with grand-daughter and godson   Occupation: retired, 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   Edu: HS   Social Determinants of Radio broadcast assistant Strain: Low Risk    Difficulty of Paying Living Expenses: Not hard at all  Food Insecurity: No Food Insecurity   Worried About Charity fundraiser in the Last Year: Never true   Arboriculturist in the Last Year: Never true  Transportation Needs: No Transportation Needs   Lack of Transportation (Medical): No   Lack of Transportation (Non-Medical): No  Physical Activity: Inactive   Days of Exercise per Week: 0 days   Minutes of Exercise per Session: 0 min  Stress: No Stress Concern Present   Feeling of Stress : Not at all  Social Connections: Not on file  Intimate Partner Violence: Not At Risk   Fear of Current or Ex-Partner:  No   Emotionally Abused: No   Physically Abused: No   Sexually Abused: No    FH:  Family History  Problem Relation Age of Onset   Cancer Mother        ovarian   Sudden death Mother    Diabetes Sister    Hypertension Sister    Cancer Maternal Grandmother        lung   Heart disease Maternal Grandmother    Hypertension Cousin    CAD Neg Hx    Stroke Neg Hx    Colon cancer Neg Hx    Esophageal cancer Neg Hx    Rectal cancer Neg Hx     Past Medical History:  Diagnosis Date   Anemia    Atrial fibrillation (HCC)    Atrial fibrillation with  RVR ([place]) 2014   persistent   Chronic diastolic CHF (congestive heart failure) (HCC)    HTN   Chronic diastolic congestive heart failure ([place])    COVID-19 virus infection 01/20/2019   ESOPHAGEAL STRICTURE 02/19/2005   Qualifier: Diagnosis of  By: Joanna Amen MD, Joanna Hood    Heart murmur    History of anemia    unclear cause, now resolved   History of chicken pox    History of colon polyps    benign   HLD (hyperlipidemia)    Hypothyroidism    Malignant hypertension longstanding   Microcytosis    Mitral valve regurgitation    Osteopenia 12/2013   T -2.3 hip   Osteoporosis    Pulmonary hypertension (HCC)    Rheumatoid arthritis ([place]) 11/2015   +RF, +CCP, ESR 49, synovitis on exam and [place] 11/2015 Joanna Hood)   Right lower lobe pneumonia 07/24/2014   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   29 mm Medtronic Mosaic stented porcine bioprosthetic tissue valve   Tricuspid regurgitation    Vitamin D deficiency     Current Outpatient Medications  Medication Sig Dispense Refill   acetaminophen (TYLENOL) 325 MG tablet Take 650 mg by mouth every 6 (six) hours as needed (pain).     amLODipine (NORVASC) 10 MG tablet Take 1 tablet (10 mg total) by mouth daily. 30 tablet 3   aspirin EC 81 MG tablet Take 81 mg by mouth daily. Swallow whole.     Calcium Carb-Cholecalciferol (CALCIUM-VITAMIN D) 600-400 MG-UNIT TABS Take 1 tablet by mouth daily.     cholecalciferol (VITAMIN D) 1000 UNITS tablet Take 1,000 Units by mouth daily.     ferrous sulfate 325 (65 FE) MG tablet Take 1 tablet (325 mg total) by mouth every Monday, Wednesday, and Friday.     folic acid (FOLVITE) 1 MG tablet Take 1 tablet (1 mg total) by mouth daily. 90 tablet 3   furosemide (LASIX) 20 MG tablet Take 10 mg by mouth daily.     levothyroxine (SYNTHROID) 125 MCG tablet Take 1 tablet (125 mcg total) by mouth daily before breakfast. 90 tablet 3   methotrexate (RHEUMATREX) 2.5 MG tablet Take 15 mg by  mouth every Friday. Caution:Chemotherapy. Protect from light.     omeprazole (PRILOSEC) 20 MG capsule Take 1 capsule (20 mg total) by mouth daily. 30 capsule 11   sacubitril-valsartan (ENTRESTO) 97-103 MG Take 1 tablet by mouth 2 (two) times daily. 60 tablet 6   spironolactone (ALDACTONE) 25 MG tablet Take 1 tablet (25 mg total) by mouth daily. 30 tablet 3   warfarin (COUMADIN) 4 MG tablet Take 1 tablet (4 mg  total) by mouth daily at 4 PM. (Patient taking differently: Take 2-4 mg by mouth See admin instructions. Take 1/2 tablet on Wednesday then take 1 tablet all the other days) 30 tablet 1   Current Facility-Administered Medications  Medication Dose Route Frequency Provider Last Rate Last Admin   0.9 %  sodium chloride infusion  500 mL Intravenous Continuous Irene Shipper, MD        Vitals:   02/06/21 1431  BP: 134/67  Pulse: 64  SpO2: 98%  Weight: 64.8 kg (142 lb 12.8 oz)   Wt Readings from Last 3 Encounters:  02/06/21 64.8 kg (142 lb 12.8 oz)  01/28/21 65.3 kg (143 lb 15.4 oz)  01/23/21 63.5 kg (140 lb)    PHYSICAL EXAM: General:  Well appearing. No resp difficulty HEENT: normal Neck: supple. no JVD. Carotids 2+ bilat; no bruits. No lymphadenopathy or thryomegaly appreciated. Cor: PMI nondisplaced. Irregular rate & rhythm. No rubs, gallops or murmurs. Lungs: clear Abdomen: soft, nontender, nondistended. No hepatosplenomegaly. No bruits or masses. Good bowel sounds. Extremities: no cyanosis, clubbing, rash, edema Neuro: alert & orientedx3, cranial nerves grossly intact. moves all 4 extremities w/o difficulty. Affect pleasant  ECG: AF 63 Personally reviewed  ASSESSMENT & PLAN:  1. Severe Rheumatic Mitral Valve Regurgitation --> 10/28/20 S/P MVR - Primary MR. LVEF normal 60-65%  - LHC w/ normal coronaries 5/22 - S/P Bioprosthetic MVR 10/28/20 -  Echo 12/16/20 EF 50-55% MVR ok RV mildly down  - INR followed at [facility]. INR 2.4 - Doing great. Starting cardiac  rehab next week  - Discussed SBE prophylaxis with procedures   2. Severe Pulmonary Hypertension  - TTE 8/21 w/ severely elevated RVSP, 78 mmHg. RV mildly reduced - TEE 10/21/20 w/ moderately reduced RV + mild-mod TR - RVSP on RHC severely elevated at 97 mmHg pre-MVR - Repeat RHC on 5/6 with improved hemodynamics but PA pressures and PVR still elevated.    3. Chronic Diastolic Heart Failure w/ Predominant RV Dysfunction  - 2D Echo 8/21 showed normal LVEF, 60-65%, w/ mild LVH and G3DD c/w restrictive physiology. RV was mildly reduced at the time - TEE 10/21/20 LVEF normal. RV systolic function is now moderately reduced w/ mild-mod TR, in setting of progressive/ now severe MR. - Echo 6/22 EF 50-55% mild RV dysfunction. MVR ok  - NYHA II. - Volume status looks good - Continue Spiro 25 mg daily  - Continue entresto 97-103 mg tbid - HR too low for b-blocker - Wants to defer SGLT2i at this time - Recent labs ok    4. Chronic Afib: in setting of severe MR w/ severe LAE. - Rate controlled. On coumadin. Followed by [facility] Clinic  - Check CBC   5. Rheumatoid Arthritis - on MTX - followed by Rheumatology    6. Hypertension  - Blood pressure improved controlled with amlodipine. Continue current regimen.   Glori Bickers MD  2:45 PM

## 2021-02-07 ENCOUNTER — Encounter (HOSPITAL_COMMUNITY): Payer: Medicare HMO

## 2021-02-10 ENCOUNTER — Other Ambulatory Visit: Payer: Self-pay | Admitting: Cardiovascular Disease

## 2021-02-10 ENCOUNTER — Encounter (HOSPITAL_COMMUNITY)
Admission: RE | Admit: 2021-02-10 | Discharge: 2021-02-10 | Disposition: A | Discharge status: Home / Self Care | Payer: Medicare HMO | Source: Ambulatory Visit | Attending: Cardiovascular Disease | Admitting: Cardiovascular Disease

## 2021-02-10 ENCOUNTER — Other Ambulatory Visit: Payer: Self-pay

## 2021-02-10 DIAGNOSIS — Z952 Presence of prosthetic heart valve: Secondary | ICD-10-CM

## 2021-02-10 NOTE — Progress Notes (Signed)
Cardiac Individual Treatment Plan  Patient Details  Name: Joanna Hood MRN: [id number] Date of Birth: [birth year] Referring Provider:   Flowsheet Row CARDIAC REHAB PHASE II ORIENTATION from 01/28/2021 in [facility]  Referring Provider Lorretta Harp, MD       Initial Encounter Date:  [place] from 01/28/2021 in [facility]  Date 01/28/21       Visit Diagnosis: 10/28/20 MVR (mitral valve replacement)  Patient's Home Medications on Admission:  Current Outpatient Medications:    acetaminophen (TYLENOL) 325 MG tablet, Take 650 mg by mouth every 6 (six) hours as needed (pain)., Disp: , Rfl:    amLODipine (NORVASC) 10 MG tablet, Take 1 tablet (10 mg total) by mouth daily., Disp: 30 tablet, Rfl: 3   amoxicillin (AMOXIL) 500 MG tablet, Take 4 tablets (2,000 mg total) by mouth as needed (1 hour prior to dental work)., Disp: 4 tablet, Rfl: 3   aspirin EC 81 MG tablet, Take 81 mg by mouth daily. Swallow whole., Disp: , Rfl:    Calcium Carb-Cholecalciferol (CALCIUM-VITAMIN D) 600-400 MG-UNIT TABS, Take 1 tablet by mouth daily., Disp: , Rfl:    cholecalciferol (VITAMIN D) 1000 UNITS tablet, Take 1,000 Units by mouth daily., Disp: , Rfl:    ferrous sulfate 325 (65 FE) MG tablet, Take 1 tablet (325 mg total) by mouth every Monday, Wednesday, and Friday., Disp: , Rfl:    folic acid (FOLVITE) 1 MG tablet, Take 1 tablet (1 mg total) by mouth daily., Disp: 90 tablet, Rfl: 3   furosemide (LASIX) 20 MG tablet, Take 10 mg by mouth daily., Disp: , Rfl:    levothyroxine (SYNTHROID) 125 MCG tablet, Take 1 tablet (125 mcg total) by mouth daily before breakfast., Disp: 90 tablet, Rfl: 3   methotrexate (RHEUMATREX) 2.5 MG tablet, Take 15 mg by mouth every Friday. Caution:Chemotherapy. Protect from light., Disp: , Rfl:    omeprazole (PRILOSEC) 20 MG capsule, Take 1 capsule (20 mg total) by mouth daily.,  Disp: 30 capsule, Rfl: 11   sacubitril-valsartan (ENTRESTO) 97-103 MG, Take 1 tablet by mouth 2 (two) times daily., Disp: 60 tablet, Rfl: 6   spironolactone (ALDACTONE) 25 MG tablet, Take 1 tablet (25 mg total) by mouth daily., Disp: 30 tablet, Rfl: 3   warfarin (COUMADIN) 4 MG tablet, Take 1 tablet (4 mg total) by mouth daily at 4 PM. (Patient taking differently: Take 2-4 mg by mouth See admin instructions. Take 1/2 tablet on Wednesday then take 1 tablet all the other days), Disp: 30 tablet, Rfl: 1  Current Facility-Administered Medications:    0.9 %  sodium chloride infusion, 500 mL, Intravenous, Continuous, Irene Shipper, MD  Past Medical History: Past Medical History:  Diagnosis Date   Anemia    Atrial fibrillation [facility])    Atrial fibrillation with RVR ([place]) 2014   persistent   Chronic diastolic CHF (congestive heart failure) (HCC)    HTN   Chronic diastolic congestive heart failure ([place])    COVID-19 virus infection 01/20/2019   ESOPHAGEAL STRICTURE 02/19/2005   Qualifier: Diagnosis of  By: Amil Amen MD, Caleen Essex    Heart murmur    History of anemia    unclear cause, now resolved   History of chicken pox    History of colon polyps    benign   HLD (hyperlipidemia)    Hypothyroidism    Malignant hypertension longstanding   Microcytosis  Mitral valve regurgitation    Osteopenia 12/2013   T -2.3 hip   Osteoporosis    Pulmonary hypertension (HCC)    Rheumatoid arthritis ([place]) 11/2015   +RF, +CCP, ESR 49, synovitis on exam and [place] 11/2015 [facility])   Right lower lobe pneumonia 07/24/2014   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   29 mm Medtronic Mosaic stented porcine bioprosthetic tissue valve   Tricuspid regurgitation    Vitamin D deficiency     Tobacco Use: Social History   Tobacco Use  Smoking Status Former   Types: Cigarettes   Quit date: 05/16/1969   Years since quitting: 51.7  Smokeless Tobacco Former    Labs: Recent  Government social research officer for [facility] Cardiac and Pulmonary Rehab Latest Ref Rng & Units 11/04/2020 11/05/2020 11/06/2020 11/06/2020 11/07/2020   Cholestrol 0 - 200 mg/dL - - - - -   LDLCALC 0 - 99 mg/dL - - - - -   HDL >39.00 mg/dL - - - - -   Trlycerides 0.0 - 149.0 mg/dL - - - - -   Hemoglobin A1c 4.8 - 5.6 % - - - - -   PHART 7.350 - 7.450 - - - - -   PCO2ART 32.0 - 48.0 mmHg - - - - -   HCO3 20.0 - 28.0 mmol/L - - - - -   TCO2 22 - 32 mmol/L - - - - -   ACIDBASEDEF 0.0 - 2.0 mmol/L - - - - -   O2SAT % 60.7 60.3 48.4 47.0 49.7       Capillary Blood Glucose: Lab Results  Component Value Date   GLUCAP 120 (H) 10/30/2020   GLUCAP 117 (H) 10/30/2020   GLUCAP 156 (H) 10/30/2020   GLUCAP 126 (H) 10/30/2020   GLUCAP 119 (H) 10/29/2020     Exercise Target Goals: Exercise Program Goal: Individual exercise prescription set using results from initial 6 min walk test and THRR while considering  patient's activity barriers and safety.   Exercise Prescription Goal: Initial exercise prescription builds to 30-45 minutes a day of aerobic activity, 2-3 days per week.  Home exercise guidelines will be given to patient during program as part of exercise prescription that the participant will acknowledge.  Activity Barriers & Risk Stratification:  Activity Barriers & Cardiac Risk Stratification - 01/28/21 0958       Activity Barriers & Cardiac Risk Stratification   Activity Barriers Arthritis;Other (comment)    Comments Rheumatoid arthritis. Left hip pain, occassionally "gives out".    Cardiac Risk Stratification High             6 Minute Walk:  6 Minute Walk     Row Name 01/28/21 1009         6 Minute Walk   Phase Initial     Distance 1138 feet     Walk Time 6 minutes     # of Rest Breaks 0     MPH 2.15     METS 3.19     RPE 11     Perceived Dyspnea  0     VO2 Peak 11.16     Symptoms No     Resting HR 82 bpm     Resting BP 172/80     Resting Oxygen Saturation  99 %      Exercise Oxygen Saturation  during 6 min walk 93 %     Max Ex. HR 119 bpm  Max Ex. BP 192/78     2 Minute Post BP 176/88              Oxygen Initial Assessment:   Oxygen Re-Evaluation:   Oxygen Discharge (Final Oxygen Re-Evaluation):   Initial Exercise Prescription:  Initial Exercise Prescription - 01/28/21 1000       Date of Initial Exercise RX and Referring Provider   Date 01/28/21    Referring Provider Lorretta Harp, MD    Expected Discharge Date 03/28/21      NuStep   Level 2    SPM 75    Minutes 15    METs 2      Track   Laps 13    Minutes 15    METs 2.51      Prescription Details   Frequency (times per week) 3    Duration Progress to 30 minutes of continuous aerobic without signs/symptoms of physical distress      Intensity   THRR 40-80% of Max Heartrate 59-118    Ratings of Perceived Exertion 11-13    Perceived Dyspnea 0-4      Progression   Progression Continue to progress workloads to maintain intensity without signs/symptoms of physical distress.      Resistance Training   Training Prescription Yes    Weight 2 lbs    Reps 10-15             Perform Capillary Blood Glucose checks as needed.  Exercise Prescription Changes:   Exercise Prescription Changes     Row Name 02/10/21 1000             Response to Exercise   Blood Pressure (Admit) 138/64       Blood Pressure (Exercise) 162/60       Blood Pressure (Exit) 138/78       Heart Rate (Admit) 70 bpm       Heart Rate (Exercise) 104 bpm       Heart Rate (Exit) 75 bpm       Rating of Perceived Exertion (Exercise) 10       Symptoms None       Comments Pt's first day in the [facility] program       Duration Continue with 30 min of aerobic exercise without signs/symptoms of physical distress.       Intensity THRR unchanged               Progression   Progression Continue to progress workloads to maintain intensity without signs/symptoms of physical distress.       Average  METs 2.4               Resistance Training   Training Prescription Yes       Weight 2 lbs       Reps 10-15       Time 10 Minutes               Interval Training   Interval Training No               NuStep   Level 2       SPM 85       Minutes 15       METs 2.2               Track   Laps 14       Minutes 15       METs 2.62  Exercise Comments:   Exercise Comments     Row Name 02/10/21 1024           Exercise Comments Pt's first day in the [facility] program. Pt tolerated session well with no complaints.                Exercise Goals and Review:   Exercise Goals     Row Name 01/28/21 0939             Exercise Goals   Increase Physical Activity Yes       Intervention Provide advice, education, support and counseling about physical activity/exercise needs.;Develop an individualized exercise prescription for aerobic and resistive training based on initial evaluation findings, risk stratification, comorbidities and participant's personal goals.       Expected Outcomes Short Term: Attend rehab on a regular basis to increase amount of physical activity.;Long Term: Exercising regularly at least 3-5 days a week.;Long Term: Add in home exercise to make exercise part of routine and to increase amount of physical activity.       Increase Strength and Stamina Yes       Intervention Provide advice, education, support and counseling about physical activity/exercise needs.;Develop an individualized exercise prescription for aerobic and resistive training based on initial evaluation findings, risk stratification, comorbidities and participant's personal goals.       Expected Outcomes Short Term: Increase workloads from initial exercise prescription for resistance, speed, and METs.;Short Term: Perform resistance training exercises routinely during rehab and add in resistance training at home;Long Term: Improve cardiorespiratory fitness, muscular endurance and  strength as measured by increased METs and functional capacity (6MWT)       Able to understand and use rate of perceived exertion (RPE) scale Yes       Intervention Provide education and explanation on how to use RPE scale       Expected Outcomes Short Term: Able to use RPE daily in rehab to express subjective intensity level;Long Term:  Able to use RPE to guide intensity level when exercising independently       Knowledge and understanding of Target Heart Rate Range (THRR) Yes       Intervention Provide education and explanation of THRR including how the numbers were predicted and where they are located for reference       Expected Outcomes Short Term: Able to state/look up THRR;Long Term: Able to use THRR to govern intensity when exercising independently;Short Term: Able to use daily as guideline for intensity in rehab       Able to check pulse independently Yes       Intervention Provide education and demonstration on how to check pulse in carotid and radial arteries.;Review the importance of being able to check your own pulse for safety during independent exercise       Expected Outcomes Short Term: Able to explain why pulse checking is important during independent exercise;Long Term: Able to check pulse independently and accurately       Understanding of Exercise Prescription Yes       Intervention Provide education, explanation, and written materials on patient's individual exercise prescription       Expected Outcomes Short Term: Able to explain program exercise prescription;Long Term: Able to explain home exercise prescription to exercise independently                Exercise Goals Re-Evaluation :  Exercise Goals Re-Evaluation     [place] Name 02/10/21 1022  Exercise Goal Re-Evaluation   Exercise Goals Review Increase Physical Activity;Increase Strength and Stamina;Able to understand and use rate of perceived exertion (RPE) scale;Knowledge and understanding of Target  Heart Rate Range (THRR);Understanding of Exercise Prescription       Comments Pt's first day in the [facility] program. Pt understands the exercise Rx, THRR, and RPE scale.       Expected Outcomes WIll continue to monitor patient and progress exercise workloads was tolerated.                Discharge Exercise Prescription (Final Exercise Prescription Changes):  Exercise Prescription Changes - 02/10/21 1000       Response to Exercise   Blood Pressure (Admit) 138/64    Blood Pressure (Exercise) 162/60    Blood Pressure (Exit) 138/78    Heart Rate (Admit) 70 bpm    Heart Rate (Exercise) 104 bpm    Heart Rate (Exit) 75 bpm    Rating of Perceived Exertion (Exercise) 10    Symptoms None    Comments Pt's first day in the [facility] program    Duration Continue with 30 min of aerobic exercise without signs/symptoms of physical distress.    Intensity THRR unchanged      Progression   Progression Continue to progress workloads to maintain intensity without signs/symptoms of physical distress.    Average METs 2.4      Resistance Training   Training Prescription Yes    Weight 2 lbs    Reps 10-15    Time 10 Minutes      Interval Training   Interval Training No      NuStep   Level 2    SPM 85    Minutes 15    METs 2.2      Track   Laps 14    Minutes 15    METs 2.62             Nutrition:  Target Goals: Understanding of nutrition guidelines, daily intake of sodium <1567m, cholesterol <2025m calories 30% from fat and 7% or less from saturated fats, daily to have 5 or more servings of fruits and vegetables.  Biometrics:  Pre Biometrics - 01/28/21 0937       Pre Biometrics   Waist Circumference 35.75 inches    Hip Circumference 40 inches    Waist to Hip Ratio 0.89 %    Triceps Skinfold 30 mm    % Body Fat 39.2 %    Grip Strength 18 kg    Flexibility 16 in    Single Leg Stand 1.53 seconds              Nutrition Therapy Plan and Nutrition Goals:   Nutrition  Assessments:  MEDIFICTS Score Key: ?70 Need to make dietary changes  40-70 Heart Healthy Diet ? 40 Therapeutic Level Cholesterol Diet    Picture Your Plate Scores: <4<15nhealthy dietary pattern with much room for improvement. 41-50 Dietary pattern unlikely to meet recommendations for good health and room for improvement. 51-60 More healthful dietary pattern, with some room for improvement.  >60 Healthy dietary pattern, although there may be some specific behaviors that could be improved.    Nutrition Goals Re-Evaluation:   Nutrition Goals Re-Evaluation:   Nutrition Goals Discharge (Final Nutrition Goals Re-Evaluation):   Psychosocial: Target Goals: Acknowledge presence or absence of significant depression and/or stress, maximize coping skills, provide positive support system. Participant is able to verbalize types and ability to use techniques and skills needed for  reducing stress and depression.  Initial Review & Psychosocial Screening:  Initial Psych Review & Screening - 01/28/21 1228       Initial Review   Current issues with None Identified      Family Dynamics   Good Support System? Yes   Cindel lives with her daughter and great grand children     Barriers   Psychosocial barriers to participate in program There are no identifiable barriers or psychosocial needs.      Screening Interventions   Interventions Encouraged to exercise             Quality of Life Scores:  Quality of Life - 01/28/21 1102       Quality of Life   Select Quality of Life      Quality of Life Scores   Health/Function Pre 29.1 %    Socioeconomic Pre 27 %    Psych/Spiritual Pre 29.14 %    Family Pre 27.6 %    GLOBAL Pre 28.4 %            Scores of 19 and below usually indicate a poorer quality of life in these areas.  A difference of  2-3 points is a clinically meaningful difference.  A difference of 2-3 points in the total score of the Quality of Life Index has been  associated with significant improvement in overall quality of life, self-image, physical symptoms, and general health in studies assessing change in quality of life.  PHQ-9: Recent Review Flowsheet Data     Depression screen [facility] 2/9 01/28/2021 09/04/2020 06/28/2020 06/02/2019 05/26/2018   Decreased Interest 0 0 0 0 0   Down, Depressed, Hopeless 0 0 0 0 0   PHQ - 2 Score 0 0 0 0 0   Altered sleeping - 0 - 0 0   Tired, decreased energy - 0 - 0 0   Change in appetite - 0 - 0 0   Feeling bad or failure about yourself  - 0 - 0 0   Trouble concentrating - 0 - 0 0   Moving slowly or fidgety/restless - 0 - 0 0   Suicidal thoughts - 0 - 0 0   PHQ-9 Score - 0 - 0 0   Difficult doing work/chores - Not difficult at all - Not difficult at all Not difficult at all      Interpretation of Total Score  Total Score Depression Severity:  1-4 = Minimal depression, 5-9 = Mild depression, 10-14 = Moderate depression, 15-19 = Moderately severe depression, 20-27 = Severe depression   Psychosocial Evaluation and Intervention:   Psychosocial Re-Evaluation:   Psychosocial Discharge (Final Psychosocial Re-Evaluation):   Vocational Rehabilitation: Provide vocational rehab assistance to qualifying candidates.   Vocational Rehab Evaluation & Intervention:  Vocational Rehab - 01/28/21 1230       Initial Vocational Rehab Evaluation & Intervention   Assessment shows need for Vocational Rehabilitation No   Easter is retired and does not need vocational rehab at this time            Education: Education Goals: Education classes will be provided on a weekly basis, covering required topics. Participant will state understanding/return demonstration of topics presented.  Learning Barriers/Preferences:  Learning Barriers/Preferences - 01/28/21 1229       Learning Barriers/Preferences   Learning Barriers Sight   Wears glasses   Learning Preferences Pictoral;Skilled Demonstration              Education Topics: Count Your Pulse:  -Group  instruction provided by verbal instruction, demonstration, patient participation and written materials to support subject.  Instructors address importance of being able to find your pulse and how to count your pulse when at home without a heart monitor.  Patients get hands on experience counting their pulse with staff help and individually.   Heart Attack, Angina, and Risk Factor Modification:  -Group instruction provided by verbal instruction, video, and written materials to support subject.  Instructors address signs and symptoms of angina and heart attacks.    Also discuss risk factors for heart disease and how to make changes to improve heart health risk factors.   Functional Fitness:  -Group instruction provided by verbal instruction, demonstration, patient participation, and written materials to support subject.  Instructors address safety measures for doing things around the house.  Discuss how to get up and down off the floor, how to pick things up properly, how to safely get out of a chair without assistance, and balance training.   Meditation and Mindfulness:  -Group instruction provided by verbal instruction, patient participation, and written materials to support subject.  Instructor addresses importance of mindfulness and meditation practice to help reduce stress and improve awareness.  Instructor also leads participants through a meditation exercise.    Stretching for Flexibility and Mobility:  -Group instruction provided by verbal instruction, patient participation, and written materials to support subject.  Instructors lead participants through series of stretches that are designed to increase flexibility thus improving mobility.  These stretches are additional exercise for major muscle groups that are typically performed during regular warm up and cool down.   Hands Only CPR:  -Group verbal, video, and participation provides a  basic overview of AHA guidelines for community CPR. Role-play of emergencies allow participants the opportunity to practice calling for help and chest compression technique with discussion of AED use.   Hypertension: -Group verbal and written instruction that provides a basic overview of hypertension including the most recent diagnostic guidelines, risk factor reduction with self-care instructions and medication management.    Nutrition I class: Heart Healthy Eating:  -Group instruction provided by PowerPoint slides, verbal discussion, and written materials to support subject matter. The instructor gives an explanation and review of the Therapeutic Lifestyle Changes diet recommendations, which includes a discussion on lipid goals, dietary fat, sodium, fiber, plant stanol/sterol esters, sugar, and the components of a well-balanced, healthy diet.   Nutrition II class: Lifestyle Skills:  -Group instruction provided by PowerPoint slides, verbal discussion, and written materials to support subject matter. The instructor gives an explanation and review of label reading, grocery shopping for heart health, heart healthy recipe modifications, and ways to make healthier choices when eating out.   Diabetes Question & Answer:  -Group instruction provided by PowerPoint slides, verbal discussion, and written materials to support subject matter. The instructor gives an explanation and review of diabetes co-morbidities, pre- and post-prandial blood glucose goals, pre-exercise blood glucose goals, signs, symptoms, and treatment of hypoglycemia and hyperglycemia, and foot care basics.   Diabetes Blitz:  -Group instruction provided by PowerPoint slides, verbal discussion, and written materials to support subject matter. The instructor gives an explanation and review of the physiology behind type 1 and type 2 diabetes, diabetes medications and rational behind using different medications, pre- and post-prandial  blood glucose recommendations and Hemoglobin A1c goals, diabetes diet, and exercise including blood glucose guidelines for exercising safely.    Portion Distortion:  -Group instruction provided by PowerPoint slides, verbal discussion, written materials, and food models  to support subject matter. The instructor gives an explanation of serving size versus portion size, changes in portions sizes over the last 20 years, and what consists of a serving from each food group.   Stress Management:  -Group instruction provided by verbal instruction, video, and written materials to support subject matter.  Instructors review role of stress in heart disease and how to cope with stress positively.     Exercising on Your Own:  -Group instruction provided by verbal instruction, power point, and written materials to support subject.  Instructors discuss benefits of exercise, components of exercise, frequency and intensity of exercise, and end points for exercise.  Also discuss use of nitroglycerin and activating EMS.  Review options of places to exercise outside of rehab.  Review guidelines for sex with heart disease.   Cardiac Drugs I:  -Group instruction provided by verbal instruction and written materials to support subject.  Instructor reviews cardiac drug classes: antiplatelets, anticoagulants, beta blockers, and statins.  Instructor discusses reasons, side effects, and lifestyle considerations for each drug class.   Cardiac Drugs II:  -Group instruction provided by verbal instruction and written materials to support subject.  Instructor reviews cardiac drug classes: angiotensin converting enzyme inhibitors (ACE-I), angiotensin II receptor blockers (ARBs), nitrates, and calcium channel blockers.  Instructor discusses reasons, side effects, and lifestyle considerations for each drug class.   Anatomy and Physiology of the Circulatory System:  Group verbal and written instruction and models provide basic  cardiac anatomy and physiology, with the coronary electrical and arterial systems. Review of: AMI, Angina, Valve disease, Heart Failure, Peripheral Artery Disease, Cardiac Arrhythmia, Pacemakers, and the ICD.   Other Education:  -Group or individual verbal, written, or video instructions that support the educational goals of the cardiac rehab program.   Holiday Eating Survival Tips:  -Group instruction provided by PowerPoint slides, verbal discussion, and written materials to support subject matter. The instructor gives patients tips, tricks, and techniques to help them not only survive but enjoy the holidays despite the onslaught of food that accompanies the holidays.   Knowledge Questionnaire Score:  Knowledge Questionnaire Score - 01/28/21 1100       Knowledge Questionnaire Score   Pre Score 21/24             Core Components/Risk Factors/Patient Goals at Admission:  Personal Goals and Risk Factors at Admission - 01/28/21 0938       Core Components/Risk Factors/Patient Goals on Admission    Weight Management Yes;Weight Maintenance    Intervention Weight Management: Develop a combined nutrition and exercise program designed to reach desired caloric intake, while maintaining appropriate intake of nutrient and fiber, sodium and fats, and appropriate energy expenditure required for the weight goal.;Weight Management: Provide education and appropriate resources to help participant work on and attain dietary goals.    Admit Weight 143 lb 15.4 oz (65.3 kg)    Goal Weight: Short Term 140 lb (63.5 kg)    Goal Weight: Long Term 140 lb (63.5 kg)    Expected Outcomes Short Term: Continue to assess and modify interventions until short term weight is achieved;Long Term: Adherence to nutrition and physical activity/exercise program aimed toward attainment of established weight goal;Weight Maintenance: Understanding of the daily nutrition guidelines, which includes 25-35% calories from fat, 7% or  less cal from saturated fats, less than 268m cholesterol, less than 1.5gm of sodium, & 5 or more servings of fruits and vegetables daily    Heart Failure Yes    Intervention Provide a  combined exercise and nutrition program that is supplemented with education, support and counseling about heart failure. Directed toward relieving symptoms such as shortness of breath, decreased exercise tolerance, and extremity edema.    Expected Outcomes Improve functional capacity of life;Short term: Attendance in program 2-3 days a week with increased exercise capacity. Reported lower sodium intake. Reported increased fruit and vegetable intake. Reports medication compliance.;Short term: Daily weights obtained and reported for increase. Utilizing diuretic protocols set by physician.;Long term: Adoption of self-care skills and reduction of barriers for early signs and symptoms recognition and intervention leading to self-care maintenance.    Hypertension Yes    Intervention Provide education on lifestyle modifcations including regular physical activity/exercise, weight management, moderate sodium restriction and increased consumption of fresh fruit, vegetables, and low fat dairy, alcohol moderation, and smoking cessation.;Monitor prescription use compliance.    Expected Outcomes Short Term: Continued assessment and intervention until BP is < 140/79m HG in hypertensive participants. < 130/8105mHG in hypertensive participants with diabetes, heart failure or chronic kidney disease.;Long Term: Maintenance of blood pressure at goal levels.    Stress Yes    Intervention Offer individual and/or small group education and counseling on adjustment to heart disease, stress management and health-related lifestyle change. Teach and support self-help strategies.;Refer participants experiencing significant psychosocial distress to appropriate mental health specialists for further evaluation and treatment. When possible, include family  members and significant others in education/counseling sessions.    Expected Outcomes Short Term: Participant demonstrates changes in health-related behavior, relaxation and other stress management skills, ability to obtain effective social support, and compliance with psychotropic medications if prescribed.;Long Term: Emotional wellbeing is indicated by absence of clinically significant psychosocial distress or social isolation.    Personal Goal Other Yes    Personal Goal Be able to walk more comfortably.    Intervention Increase walking duration at home in addition to walking at cardiac rehab to build walking tolerance.    Expected Outcomes Patient will be able to walk more comforatbly. Patient's distance on 6-minute walk test will increase.             Core Components/Risk Factors/Patient Goals Review:    Core Components/Risk Factors/Patient Goals at Discharge (Final Review):    ITP Comments:  ITP Comments     Row Name 01/28/21 0938           ITP Comments Medical Director- Dr. TrFransico HimMD                Comments: [facility]ed cardiac rehab today.  Pt tolerated light exercise without difficulty. VSS, telemetry-Atrial fibrillation, asymptomatic.  Medication list reconciled. Pt denies barriers to medicaiton compliance.  PSYCHOSOCIAL ASSESSMENT:  PHQ-0. Pt exhibits positive coping skills, hopeful outlook with supportive family. No psychosocial needs identified at this time, no psychosocial interventions necessary.    Pt enjoys spending time with her great grandchildren.   Pt oriented to exercise equipment and routine.    Understanding verbalized. MaBarnet PallRN,BSN 02/10/2021 11:15 AM

## 2021-02-12 ENCOUNTER — Encounter (HOSPITAL_COMMUNITY)
Admission: RE | Admit: 2021-02-12 | Discharge: 2021-02-12 | Disposition: A | Discharge status: Home / Self Care | Payer: Medicare HMO | Source: Ambulatory Visit | Attending: Cardiovascular Disease | Admitting: Cardiovascular Disease

## 2021-02-12 ENCOUNTER — Other Ambulatory Visit: Payer: Self-pay

## 2021-02-12 DIAGNOSIS — Z952 Presence of prosthetic heart valve: Secondary | ICD-10-CM | POA: Diagnosis not present

## 2021-02-12 NOTE — Progress Notes (Signed)
Entry BP 142/72 heart rate 76. Joanna Hood blood pressure was 180/80 on the walking track this morning. Patient asymptomatic. Joanna Hood admitted that she had not taken her blood pressure medications this morning. I asked Joanna Hood to take her BP medications at least an hour before coming to exercise at cardiac rehab. Patient states understanding. Exit BP 118/70. Heart rate 76.Barnet Pall, RN,BSN 02/12/2021 1:58 PM

## 2021-02-14 ENCOUNTER — Encounter (HOSPITAL_COMMUNITY)
Admission: RE | Admit: 2021-02-14 | Discharge: 2021-02-14 | Disposition: A | Discharge status: Home / Self Care | Payer: Medicare HMO | Source: Ambulatory Visit | Attending: Cardiovascular Disease | Admitting: Cardiovascular Disease

## 2021-02-14 ENCOUNTER — Other Ambulatory Visit: Payer: Self-pay

## 2021-02-14 DIAGNOSIS — Z952 Presence of prosthetic heart valve: Secondary | ICD-10-CM | POA: Diagnosis not present

## 2021-02-14 NOTE — Progress Notes (Signed)
Joanna Hood 75 y.o. female Nutrition Note  Diagnosis:MVR  Past Medical History:  Diagnosis Date   Anemia    Atrial fibrillation ([place])    Atrial fibrillation with RVR ([place]) 2014   persistent   Chronic diastolic CHF (congestive heart failure) ([facility])    HTN   Chronic diastolic congestive heart failure ([place])    COVID-19 virus infection 01/20/2019   ESOPHAGEAL STRICTURE 02/19/2005   Qualifier: Diagnosis of  By: Amil Amen MD, Caleen Essex    Heart murmur    History of anemia    unclear cause, now resolved   History of chicken pox    History of colon polyps    benign   HLD (hyperlipidemia)    Hypothyroidism    Malignant hypertension longstanding   Microcytosis    Mitral valve regurgitation    Osteopenia 12/2013   T -2.3 hip   Osteoporosis    Pulmonary hypertension ([place])    Rheumatoid arthritis ([place]) 11/2015   +RF, +CCP, ESR 49, synovitis on exam and [place] 11/2015 Trudie Reed)   Right lower lobe pneumonia 07/24/2014   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   Current Outpatient Medications:    acetaminophen (TYLENOL) 325 MG tablet, Take 650 mg by mouth every 6 (six) hours as needed (pain)., Disp: , Rfl:    amLODipine (NORVASC) 10 MG tablet, Take 1 tablet (10 mg total) by mouth daily., Disp: 30 tablet, Rfl: 3   amoxicillin (AMOXIL) 500 MG tablet, Take 4 tablets (2,000 mg total) by mouth as needed (1 hour prior to dental work)., Disp: 4 tablet, Rfl: 3   aspirin EC 81 MG tablet, Take 81 mg by mouth daily. Swallow whole., Disp: , Rfl:    Calcium Carb-Cholecalciferol (CALCIUM-VITAMIN D) 600-400 MG-UNIT TABS, Take 1 tablet by mouth daily., Disp: , Rfl:    cholecalciferol (VITAMIN D) 1000 UNITS tablet, Take 1,000 Units by mouth daily., Disp: , Rfl:    ferrous sulfate 325 (65 FE) MG tablet, Take 1 tablet  (325 mg total) by mouth every Monday, Wednesday, and Friday., Disp: , Rfl:    folic acid (FOLVITE) 1 MG tablet, Take 1 tablet (1 mg total) by mouth daily., Disp: 90 tablet, Rfl: 3   furosemide (LASIX) 20 MG tablet, Take 10 mg by mouth daily., Disp: , Rfl:    levothyroxine (SYNTHROID) 125 MCG tablet, Take 1 tablet (125 mcg total) by mouth daily before breakfast., Disp: 90 tablet, Rfl: 3   methotrexate (RHEUMATREX) 2.5 MG tablet, Take 15 mg by mouth every Friday. Caution:Chemotherapy. Protect from light., Disp: , Rfl:    omeprazole (PRILOSEC) 20 MG capsule, Take 1 capsule (20 mg total) by mouth daily., Disp: 30 capsule, Rfl: 11   sacubitril-valsartan (ENTRESTO) 97-103 MG, Take 1 tablet by mouth 2 (two) times daily., Disp: 60 tablet, Rfl: 6   spironolactone (ALDACTONE) 25 MG tablet, Take 1 tablet (25 mg total) by mouth daily., Disp: 30 tablet, Rfl: 3   warfarin (COUMADIN) 4 MG tablet, Use 1 to 2 tablets daily or as directed by the coumadin clinic, Disp: 60 tablet, Rfl: 0  Current Facility-Administered Medications:    0.9 %  sodium chloride infusion, 500 mL, Intravenous, Continuous, Irene Shipper, MD   Ht Readings from Last 1 Encounters:  01/28/21 5' 2.75" (1.594 m)     Wt  Readings from Last 3 Encounters:  02/06/21 142 lb 12.8 oz (64.8 kg)  01/28/21 143 lb 15.4 oz (65.3 kg)  01/23/21 140 lb (63.5 kg)     There is no height or weight on file to calculate BMI.   Social History   Tobacco Use  Smoking Status Former   Types: Cigarettes   Quit date: 05/16/1969   Years since quitting: 51.7  Smokeless Tobacco Former     Lab Results  Component Value Date   CHOL 144 09/10/2020   Lab Results  Component Value Date   HDL 32.60 (L) 09/10/2020   Lab Results  Component Value Date   LDLCALC 96 09/10/2020   Lab Results  Component Value Date   TRIG 81.0 09/10/2020     Lab Results  Component Value Date   HGBA1C 5.0 10/28/2020     CBG (last 3)  No results for input(s): GLUCAP in  the last 72 hours.   Nutrition Note  Spoke with pt. Nutrition Plan and Nutrition Survey goals reviewed with pt. Pt is following a Heart Healthy diet. She is on coumadin. She has consistent intake of vitamin k.  Pt with dx of HFpRF. Per discussion, pt does not use canned/convenience foods often. Pt does not add salt to food. Pt does not eat out frequently.  Discussed pt unintentional weight loss. Her appetite is fine. No N/V/diarrhea. She knows she eats a little less. She has maintained weight since May 2022.   Pt expressed understanding of the information reviewed.   Nutrition Diagnosis Food-and nutrition-related knowledge deficit related to lack of exposure to information as related to diagnosis of: ? CVD ?   Nutrition Intervention Pt's individual nutrition plan reviewed with pt. Benefits of adopting Heart Healthy diet discussed when Picture Your Plate reviewed.  Continue client-centered nutrition education by RD, as part of interdisciplinary care.  Goal(s) Pt to build a healthy plate including vegetables, fruits, whole grains, and low-fat dairy products in a heart healthy meal plan.  Plan:  Will provide client-centered nutrition education as part of interdisciplinary care Monitor and evaluate progress toward nutrition goal with team.   Michaele Offer, MS, RDN, LDN, CDCES

## 2021-02-17 ENCOUNTER — Encounter (HOSPITAL_COMMUNITY)
Admission: RE | Admit: 2021-02-17 | Discharge: 2021-02-17 | Disposition: A | Discharge status: Home / Self Care | Payer: Medicare HMO | Source: Ambulatory Visit | Attending: Cardiovascular Disease | Admitting: Cardiovascular Disease

## 2021-02-17 ENCOUNTER — Other Ambulatory Visit: Payer: Self-pay

## 2021-02-17 DIAGNOSIS — Z952 Presence of prosthetic heart valve: Secondary | ICD-10-CM | POA: Diagnosis not present

## 2021-02-18 ENCOUNTER — Inpatient Hospital Stay (HOSPITAL_COMMUNITY): Admission: RE | Admit: 2021-02-18 | Payer: Medicare HMO | Source: Ambulatory Visit

## 2021-02-19 ENCOUNTER — Telehealth (HOSPITAL_COMMUNITY): Payer: Self-pay | Admitting: *Deleted

## 2021-02-19 ENCOUNTER — Encounter (HOSPITAL_COMMUNITY): Payer: Medicare HMO

## 2021-02-19 NOTE — Telephone Encounter (Signed)
Patient called to say she is feeling lightheaded and will not be coming to cardiac rehab today. Patient plans to contact her PCP regarding symptoms, which she thinks may be related to her medications. Patient plans to return on Friday, 02/21/21 if she's feeling better.  Sol Passer, [place], ACSM CEP 02/19/21 [phone]

## 2021-02-20 ENCOUNTER — Telehealth (HOSPITAL_COMMUNITY): Payer: Self-pay | Admitting: Cardiology

## 2021-02-20 NOTE — Telephone Encounter (Signed)
Patient left voicemail on triage line with reports of elevated b/p

## 2021-02-21 ENCOUNTER — Encounter (HOSPITAL_COMMUNITY)
Admission: RE | Admit: 2021-02-21 | Discharge: 2021-02-21 | Disposition: A | Discharge status: Home / Self Care | Payer: Medicare HMO | Source: Ambulatory Visit | Attending: Cardiovascular Disease | Admitting: Cardiovascular Disease

## 2021-02-21 ENCOUNTER — Other Ambulatory Visit: Payer: Self-pay

## 2021-02-21 DIAGNOSIS — Z952 Presence of prosthetic heart valve: Secondary | ICD-10-CM | POA: Diagnosis not present

## 2021-02-25 ENCOUNTER — Other Ambulatory Visit (HOSPITAL_COMMUNITY): Payer: Medicare HMO

## 2021-02-26 ENCOUNTER — Encounter (HOSPITAL_COMMUNITY)
Admission: RE | Admit: 2021-02-26 | Discharge: 2021-02-26 | Disposition: A | Discharge status: Home / Self Care | Payer: Medicare HMO | Source: Ambulatory Visit | Attending: Cardiovascular Disease | Admitting: Cardiovascular Disease

## 2021-02-26 ENCOUNTER — Ambulatory Visit (HOSPITAL_COMMUNITY): Payer: Medicare HMO

## 2021-02-26 ENCOUNTER — Other Ambulatory Visit: Payer: Self-pay

## 2021-02-26 DIAGNOSIS — Z952 Presence of prosthetic heart valve: Secondary | ICD-10-CM

## 2021-02-27 NOTE — Progress Notes (Signed)
Reviewed home exercise RX. Encouraged patient to exercise 2-3 x/week for 30-45 minutes in addition to the CRP2 program. Pt is currently walking 2 x/week for 20 minutes. Encouraged warm-up, cool-down and stretching. Reviewed THRR of 59-118 and keeping RPE between 11-13. Pt encouraged to hydrate during exercise. Reviewed weather parameters for temperature and humidity for safe exercise outdoors. Reviewed S/S that would require patient to terminate exercise and when to MD vs 911. Pt verbalized understanding of the home exercise Rx and was provided a copy.  Lesly Rubenstein MS, ACSM-CEP, CCRP

## 2021-02-28 ENCOUNTER — Ambulatory Visit (HOSPITAL_COMMUNITY): Payer: Medicare HMO

## 2021-02-28 ENCOUNTER — Encounter (HOSPITAL_COMMUNITY)
Admission: RE | Admit: 2021-02-28 | Discharge: 2021-02-28 | Disposition: A | Discharge status: Home / Self Care | Payer: Medicare HMO | Source: Ambulatory Visit | Attending: Cardiovascular Disease | Admitting: Cardiovascular Disease

## 2021-02-28 ENCOUNTER — Other Ambulatory Visit: Payer: Self-pay

## 2021-02-28 DIAGNOSIS — Z952 Presence of prosthetic heart valve: Secondary | ICD-10-CM

## 2021-03-03 ENCOUNTER — Other Ambulatory Visit: Payer: Self-pay | Admitting: Physician Assistant

## 2021-03-03 ENCOUNTER — Ambulatory Visit (HOSPITAL_COMMUNITY): Payer: Medicare HMO

## 2021-03-03 ENCOUNTER — Other Ambulatory Visit: Payer: Self-pay

## 2021-03-03 ENCOUNTER — Encounter (HOSPITAL_COMMUNITY)
Admission: RE | Admit: 2021-03-03 | Discharge: 2021-03-03 | Disposition: A | Discharge status: Home / Self Care | Payer: Medicare HMO | Source: Ambulatory Visit | Attending: Cardiovascular Disease | Admitting: Cardiovascular Disease

## 2021-03-03 DIAGNOSIS — Z952 Presence of prosthetic heart valve: Secondary | ICD-10-CM | POA: Diagnosis not present

## 2021-03-04 NOTE — Progress Notes (Signed)
Cardiac Individual Treatment Plan  Patient Details  Name: Joanna Hood MRN: [id number] Date of Birth: 1945/09/03 Referring Provider:   Flowsheet Row CARDIAC REHAB PHASE II ORIENTATION from 01/28/2021 in [place]  Referring Provider Lorretta Harp, MD       Initial Encounter Date:  [place] from 01/28/2021 in [place]  Date 01/28/21       Visit Diagnosis: 10/28/20 MVR (mitral valve replacement)  Patient's Home Medications on Admission:  Current Outpatient Medications:    acetaminophen (TYLENOL) 325 MG tablet, Take 650 mg by mouth every 6 (six) hours as needed (pain)., Disp: , Rfl:    amLODipine (NORVASC) 10 MG tablet, Take 1 tablet (10 mg total) by mouth daily., Disp: 30 tablet, Rfl: 3   amoxicillin (AMOXIL) 500 MG tablet, Take 4 tablets (2,000 mg total) by mouth as needed (1 hour prior to dental work)., Disp: 4 tablet, Rfl: 3   aspirin EC 81 MG tablet, Take 81 mg by mouth daily. Swallow whole., Disp: , Rfl:    Calcium Carb-Cholecalciferol (CALCIUM-VITAMIN D) 600-400 MG-UNIT TABS, Take 1 tablet by mouth daily., Disp: , Rfl:    cholecalciferol (VITAMIN D) 1000 UNITS tablet, Take 1,000 Units by mouth daily., Disp: , Rfl:    ferrous sulfate 325 (65 FE) MG tablet, Take 1 tablet (325 mg total) by mouth every Monday, Wednesday, and Friday., Disp: , Rfl:    folic acid (FOLVITE) 1 MG tablet, Take 1 tablet (1 mg total) by mouth daily., Disp: 90 tablet, Rfl: 3   furosemide (LASIX) 20 MG tablet, Take 10 mg by mouth daily., Disp: , Rfl:    levothyroxine (SYNTHROID) 125 MCG tablet, Take 1 tablet (125 mcg total) by mouth daily before breakfast., Disp: 90 tablet, Rfl: 3   methotrexate (RHEUMATREX) 2.5 MG tablet, Take 15 mg by mouth every Friday. Caution:Chemotherapy. Protect from light., Disp: , Rfl:    omeprazole (PRILOSEC) 20 MG capsule, Take 1 capsule (20 mg total) by mouth daily.,  Disp: 30 capsule, Rfl: 11   sacubitril-valsartan (ENTRESTO) 97-103 MG, Take 1 tablet by mouth 2 (two) times daily., Disp: 60 tablet, Rfl: 6   spironolactone (ALDACTONE) 25 MG tablet, Take 1 tablet (25 mg total) by mouth daily., Disp: 30 tablet, Rfl: 3   warfarin (COUMADIN) 4 MG tablet, Use 1 to 2 tablets daily or as directed by the coumadin clinic, Disp: 60 tablet, Rfl: 0  Current Facility-Administered Medications:    0.9 %  sodium chloride infusion, 500 mL, Intravenous, Continuous, Irene Shipper, MD  Past Medical History: Past Medical History:  Diagnosis Date   Anemia    Atrial fibrillation ([place])    Atrial fibrillation with RVR ([place]) 2014   persistent   Chronic diastolic CHF (congestive heart failure) ([place])    HTN   Chronic diastolic congestive heart failure ([place])    COVID-19 virus infection 01/20/2019   ESOPHAGEAL STRICTURE 02/19/2005   Qualifier: Diagnosis of  By: Amil Amen MD, Caleen Essex    Heart murmur    History of anemia    unclear cause, now resolved   History of chicken pox    History of colon polyps    benign   HLD (hyperlipidemia)    Hypothyroidism    Malignant hypertension longstanding   Microcytosis    Mitral valve regurgitation    Osteopenia 12/2013   T -2.3 hip   Osteoporosis    Pulmonary hypertension (  [place])    Rheumatoid arthritis ([place]) 11/2015   +RF, +CCP, ESR 49, synovitis on exam and [place] 11/2015 [facility])   Right lower lobe pneumonia 07/24/2014   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   29 mm Medtronic Mosaic stented porcine bioprosthetic tissue valve   Tricuspid regurgitation    Vitamin D deficiency     Tobacco Use: Social History   Tobacco Use  Smoking Status Former   Types: Cigarettes   Quit date: 05/16/1969   Years since quitting: 51.8  Smokeless Tobacco Former    Labs: Recent Government social research officer for [facility] Cardiac and Pulmonary Rehab Latest Ref Rng & Units 11/04/2020 11/05/2020 11/06/2020 11/06/2020  11/07/2020   Cholestrol 0 - 200 mg/dL - - - - -   LDLCALC 0 - 99 mg/dL - - - - -   HDL >39.00 mg/dL - - - - -   Trlycerides 0.0 - 149.0 mg/dL - - - - -   Hemoglobin A1c 4.8 - 5.6 % - - - - -   PHART 7.350 - 7.450 - - - - -   PCO2ART 32.0 - 48.0 mmHg - - - - -   HCO3 20.0 - 28.0 mmol/L - - - - -   TCO2 22 - 32 mmol/L - - - - -   ACIDBASEDEF 0.0 - 2.0 mmol/L - - - - -   O2SAT % 60.7 60.3 48.4 47.0 49.7       Capillary Blood Glucose: Lab Results  Component Value Date   GLUCAP 120 (H) 10/30/2020   GLUCAP 117 (H) 10/30/2020   GLUCAP 156 (H) 10/30/2020   GLUCAP 126 (H) 10/30/2020   GLUCAP 119 (H) 10/29/2020     Exercise Target Goals: Exercise Program Goal: Individual exercise prescription set using results from initial 6 min walk test and THRR while considering  patient's activity barriers and safety.   Exercise Prescription Goal: Starting with aerobic activity 30 plus minutes a day, 3 days per week for initial exercise prescription. Provide home exercise prescription and guidelines that participant acknowledges understanding prior to discharge.  Activity Barriers & Risk Stratification:  Activity Barriers & Cardiac Risk Stratification - 01/28/21 0958       Activity Barriers & Cardiac Risk Stratification   Activity Barriers Arthritis;Other (comment)    Comments Rheumatoid arthritis. Left hip pain, occassionally "gives out".    Cardiac Risk Stratification High             6 Minute Walk:  6 Minute Walk     Row Name 01/28/21 1009         6 Minute Walk   Phase Initial     Distance 1138 feet     Walk Time 6 minutes     # of Rest Breaks 0     MPH 2.15     METS 3.19     RPE 11     Perceived Dyspnea  0     VO2 Peak 11.16     Symptoms No     Resting HR 82 bpm     Resting BP 172/80     Resting Oxygen Saturation  99 %     Exercise Oxygen Saturation  during 6 min walk 93 %     Max Ex. HR 119 bpm     Max Ex. BP 192/78     2 Minute Post BP 176/88  Oxygen Initial Assessment:   Oxygen Re-Evaluation:   Oxygen Discharge (Final Oxygen Re-Evaluation):   Initial Exercise Prescription:  Initial Exercise Prescription - 01/28/21 1000       Date of Initial Exercise RX and Referring Provider   Date 01/28/21    Referring Provider Lorretta Harp, MD    Expected Discharge Date 03/28/21      NuStep   Level 2    SPM 75    Minutes 15    METs 2      Track   Laps 13    Minutes 15    METs 2.51      Prescription Details   Frequency (times per week) 3    Duration Progress to 30 minutes of continuous aerobic without signs/symptoms of physical distress      Intensity   THRR 40-80% of Max Heartrate 59-118    Ratings of Perceived Exertion 11-13    Perceived Dyspnea 0-4      Progression   Progression Continue to progress workloads to maintain intensity without signs/symptoms of physical distress.      Resistance Training   Training Prescription Yes    Weight 2 lbs    Reps 10-15             Perform Capillary Blood Glucose checks as needed.  Exercise Prescription Changes:   Exercise Prescription Changes     Row Name 02/10/21 1000 02/26/21 1539           Response to Exercise   Blood Pressure (Admit) 138/64 130/68      Blood Pressure (Exercise) 162/60 148/78      Blood Pressure (Exit) 138/78 114/66      Heart Rate (Admit) 70 bpm 82 bpm      Heart Rate (Exercise) 104 bpm 116 bpm      Heart Rate (Exit) 75 bpm 74 bpm      Rating of Perceived Exertion (Exercise) 10 10      Symptoms None None      Comments Pt's first day in the CRP2 program Reviewed METs and home exercise Rx      Duration Continue with 30 min of aerobic exercise without signs/symptoms of physical distress. Continue with 30 min of aerobic exercise without signs/symptoms of physical distress.      Intensity THRR unchanged THRR unchanged             Progression   Progression Continue to progress workloads to maintain intensity without  signs/symptoms of physical distress. Continue to progress workloads to maintain intensity without signs/symptoms of physical distress.      Average METs 2.4 2.5             Resistance Training   Training Prescription Yes No      Weight 2 lbs No weights on Wednesdays      Reps 10-15 --      Time 10 Minutes --             Interval Training   Interval Training No No             NuStep   Level 2 2      SPM 85 85      Minutes 15 15      METs 2.2 2.1             Track   Laps 14 16      Minutes 15 15      METs 2.62 2.86  Home Exercise Plan   Plans to continue exercise at -- Home (comment)      Frequency -- Add 2 additional days to program exercise sessions.      Initial Home Exercises Provided -- 02/26/21               Exercise Comments:   Exercise Comments     Row Name 02/10/21 1024 02/26/21 1548         Exercise Comments Pt's first day in the CRP2 program. Pt tolerated session well with no complaints. Reviewed home exercise Rx with patient today. Pt verbalized understanding of the home exercise Rx and was provided a copy.               Exercise Goals and Review:   Exercise Goals     Row Name 01/28/21 0939             Exercise Goals   Increase Physical Activity Yes       Intervention Provide advice, education, support and counseling about physical activity/exercise needs.;Develop an individualized exercise prescription for aerobic and resistive training based on initial evaluation findings, risk stratification, comorbidities and participant's personal goals.       Expected Outcomes Short Term: Attend rehab on a regular basis to increase amount of physical activity.;Long Term: Exercising regularly at least 3-5 days a week.;Long Term: Add in home exercise to make exercise part of routine and to increase amount of physical activity.       Increase Strength and Stamina Yes       Intervention Provide advice, education, support and counseling about  physical activity/exercise needs.;Develop an individualized exercise prescription for aerobic and resistive training based on initial evaluation findings, risk stratification, comorbidities and participant's personal goals.       Expected Outcomes Short Term: Increase workloads from initial exercise prescription for resistance, speed, and METs.;Short Term: Perform resistance training exercises routinely during rehab and add in resistance training at home;Long Term: Improve cardiorespiratory fitness, muscular endurance and strength as measured by increased METs and functional capacity (6MWT)       Able to understand and use rate of perceived exertion (RPE) scale Yes       Intervention Provide education and explanation on how to use RPE scale       Expected Outcomes Short Term: Able to use RPE daily in rehab to express subjective intensity level;Long Term:  Able to use RPE to guide intensity level when exercising independently       Knowledge and understanding of Target Heart Rate Range (THRR) Yes       Intervention Provide education and explanation of THRR including how the numbers were predicted and where they are located for reference       Expected Outcomes Short Term: Able to state/look up THRR;Long Term: Able to use THRR to govern intensity when exercising independently;Short Term: Able to use daily as guideline for intensity in rehab       Able to check pulse independently Yes       Intervention Provide education and demonstration on how to check pulse in carotid and radial arteries.;Review the importance of being able to check your own pulse for safety during independent exercise       Expected Outcomes Short Term: Able to explain why pulse checking is important during independent exercise;Long Term: Able to check pulse independently and accurately       Understanding of Exercise Prescription Yes       Intervention Provide education, explanation,  and written materials on patient's individual  exercise prescription       Expected Outcomes Short Term: Able to explain program exercise prescription;Long Term: Able to explain home exercise prescription to exercise independently                Exercise Goals Re-Evaluation :  Exercise Goals Re-Evaluation     [place] Name 02/10/21 1022 02/26/21 1544           Exercise Goal Re-Evaluation   Exercise Goals Review Increase Physical Activity;Increase Strength and Stamina;Able to understand and use rate of perceived exertion (RPE) scale;Knowledge and understanding of Target Heart Rate Range (THRR);Understanding of Exercise Prescription Increase Physical Activity;Increase Strength and Stamina;Able to understand and use rate of perceived exertion (RPE) scale;Knowledge and understanding of Target Heart Rate Range (THRR);Able to check pulse independently;Understanding of Exercise Prescription      Comments Pt's first day in the CRP2 program. Pt understands the exercise Rx, THRR, and RPE scale. Reviewed METs and home exercise Rx. Pt walks at home 2x/week with her cousin for about 20 minutes. Encouraged patient to increase to 30 minutes to achieve goal of exercise 5x/week 30-45 minutes.      Expected Outcomes WIll continue to monitor patient and progress exercise workloads was tolerated. Pt will continue to walk at home on her own.                Discharge Exercise Prescription (Final Exercise Prescription Changes):  Exercise Prescription Changes - 02/26/21 1539       Response to Exercise   Blood Pressure (Admit) 130/68    Blood Pressure (Exercise) 148/78    Blood Pressure (Exit) 114/66    Heart Rate (Admit) 82 bpm    Heart Rate (Exercise) 116 bpm    Heart Rate (Exit) 74 bpm    Rating of Perceived Exertion (Exercise) 10    Symptoms None    Comments Reviewed METs and home exercise Rx    Duration Continue with 30 min of aerobic exercise without signs/symptoms of physical distress.    Intensity THRR unchanged      Progression    Progression Continue to progress workloads to maintain intensity without signs/symptoms of physical distress.    Average METs 2.5      Resistance Training   Training Prescription No    Weight No weights on Wednesdays      Interval Training   Interval Training No      NuStep   Level 2    SPM 85    Minutes 15    METs 2.1      Track   Laps 16    Minutes 15    METs 2.86      Home Exercise Plan   Plans to continue exercise at Home (comment)    Frequency Add 2 additional days to program exercise sessions.    Initial Home Exercises Provided 02/26/21             Nutrition:  Target Goals: Understanding of nutrition guidelines, daily intake of sodium <1582m, cholesterol <2027m calories 30% from fat and 7% or less from saturated fats, daily to have 5 or more servings of fruits and vegetables.  Biometrics:  Pre Biometrics - 01/28/21 0937       Pre Biometrics   Waist Circumference 35.75 inches    Hip Circumference 40 inches    Waist to Hip Ratio 0.89 %    Triceps Skinfold 30 mm    % Body Fat 39.2 %  Grip Strength 18 kg    Flexibility 16 in    Single Leg Stand 1.53 seconds              Nutrition Therapy Plan and Nutrition Goals:  Nutrition Therapy & Goals - 02/14/21 1005       Nutrition Therapy   Diet TLC      Personal Nutrition Goals   Nutrition Goal Pt to build a healthy plate including vegetables, fruits, whole grains, and low-fat dairy products in a heart healthy meal plan.      Intervention Plan   Intervention Prescribe, educate and counsel regarding individualized specific dietary modifications aiming towards targeted core components such as weight, hypertension, lipid management, diabetes, heart failure and other comorbidities.;Nutrition handout(s) given to patient.    Expected Outcomes Long Term Goal: Adherence to prescribed nutrition plan.;Short Term Goal: A plan has been developed with personal nutrition goals set during dietitian appointment.              Nutrition Assessments:  MEDIFICTS Score Key: ?70 Need to make dietary changes  40-70 Heart Healthy Diet ? 40 Therapeutic Level Cholesterol Diet  Flowsheet Row CARDIAC REHAB PHASE II EXERCISE from 02/14/2021 in [place]  Picture Your Plate Total Score on Admission 74      Picture Your Plate Scores: <71 Unhealthy dietary pattern with much room for improvement. 41-50 Dietary pattern unlikely to meet recommendations for good health and room for improvement. 51-60 More healthful dietary pattern, with some room for improvement.  >60 Healthy dietary pattern, although there may be some specific behaviors that could be improved.    Nutrition Goals Re-Evaluation:  Nutrition Goals Re-Evaluation     [place] Name 02/14/21 1005 03/03/21 0943           Goals   Current Weight 142 lb (64.4 kg) 142 lb (64.4 kg)      Nutrition Goal -- Pt to build a healthy plate including vegetables, fruits, whole grains, and low-fat dairy products in a heart healthy meal plan.               Nutrition Goals Discharge (Final Nutrition Goals Re-Evaluation):  Nutrition Goals Re-Evaluation - 03/03/21 0943       Goals   Current Weight 142 lb (64.4 kg)    Nutrition Goal Pt to build a healthy plate including vegetables, fruits, whole grains, and low-fat dairy products in a heart healthy meal plan.             Psychosocial: Target Goals: Acknowledge presence or absence of significant depression and/or stress, maximize coping skills, provide positive support system. Participant is able to verbalize types and ability to use techniques and skills needed for reducing stress and depression.  Initial Review & Psychosocial Screening:  Initial Psych Review & Screening - 01/28/21 1228       Initial Review   Current issues with None Identified      Family Dynamics   Good Support System? Yes   Briannia lives with her daughter and great grand children     Barriers    Psychosocial barriers to participate in program There are no identifiable barriers or psychosocial needs.      Screening Interventions   Interventions Encouraged to exercise             Quality of Life Scores:  Quality of Life - 01/28/21 1102       Quality of Life   Select Quality of Life  Quality of Life Scores   Health/Function Pre 29.1 %    Socioeconomic Pre 27 %    Psych/Spiritual Pre 29.14 %    Family Pre 27.6 %    GLOBAL Pre 28.4 %            Scores of 19 and below usually indicate a poorer quality of life in these areas.  A difference of  2-3 points is a clinically meaningful difference.  A difference of 2-3 points in the total score of the Quality of Life Index has been associated with significant improvement in overall quality of life, self-image, physical symptoms, and general health in studies assessing change in quality of life.  PHQ-9: Recent Review Flowsheet Data     Depression screen [facility] 2/9 01/28/2021 09/04/2020 06/28/2020 06/02/2019 05/26/2018   Decreased Interest 0 0 0 0 0   Down, Depressed, Hopeless 0 0 0 0 0   PHQ - 2 Score 0 0 0 0 0   Altered sleeping - 0 - 0 0   Tired, decreased energy - 0 - 0 0   Change in appetite - 0 - 0 0   Feeling bad or failure about yourself  - 0 - 0 0   Trouble concentrating - 0 - 0 0   Moving slowly or fidgety/restless - 0 - 0 0   Suicidal thoughts - 0 - 0 0   PHQ-9 Score - 0 - 0 0   Difficult doing work/chores - Not difficult at all - Not difficult at all Not difficult at all      Interpretation of Total Score  Total Score Depression Severity:  1-4 = Minimal depression, 5-9 = Mild depression, 10-14 = Moderate depression, 15-19 = Moderately severe depression, 20-27 = Severe depression   Psychosocial Evaluation and Intervention:   Psychosocial Re-Evaluation:  Psychosocial Re-Evaluation     Row Name 02/10/21 1206 03/04/21 1352           Psychosocial Re-Evaluation   Current issues with None Identified None  Identified      Interventions Encouraged to attend Cardiac Rehabilitation for the exercise Encouraged to attend Cardiac Rehabilitation for the exercise      Continue Psychosocial Services  No Follow up required No Follow up required               Psychosocial Discharge (Final Psychosocial Re-Evaluation):  Psychosocial Re-Evaluation - 03/04/21 1352       Psychosocial Re-Evaluation   Current issues with None Identified    Interventions Encouraged to attend Cardiac Rehabilitation for the exercise    Continue Psychosocial Services  No Follow up required             Vocational Rehabilitation: Provide vocational rehab assistance to qualifying candidates.   Vocational Rehab Evaluation & Intervention:  Vocational Rehab - 01/28/21 1230       Initial Vocational Rehab Evaluation & Intervention   Assessment shows need for Vocational Rehabilitation No   Alynna is retired and does not need vocational rehab at this time            Education: Education Goals: Education classes will be provided on a weekly basis, covering required topics. Participant will state understanding/return demonstration of topics presented.  Learning Barriers/Preferences:  Learning Barriers/Preferences - 01/28/21 1229       Learning Barriers/Preferences   Learning Barriers Sight   Wears glasses   Learning Preferences Pictoral;Skilled Demonstration             Education Topics: Hypertension, Hypertension  Reduction -Define heart disease and high blood pressure. Discus how high blood pressure affects the body and ways to reduce high blood pressure.   Exercise and Your Heart -Discuss why it is important to exercise, the FITT principles of exercise, normal and abnormal responses to exercise, and how to exercise safely.   Angina -Discuss definition of angina, causes of angina, treatment of angina, and how to decrease risk of having angina.   Cardiac Medications -Review what the following  cardiac medications are used for, how they affect the body, and side effects that may occur when taking the medications.  Medications include Aspirin, Beta blockers, calcium channel blockers, ACE Inhibitors, angiotensin receptor blockers, diuretics, digoxin, and antihyperlipidemics.   Congestive Heart Failure -Discuss the definition of CHF, how to live with CHF, the signs and symptoms of CHF, and how keep track of weight and sodium intake.   Heart Disease and Intimacy -Discus the effect sexual activity has on the heart, how changes occur during intimacy as we age, and safety during sexual activity.   Smoking Cessation / COPD -Discuss different methods to quit smoking, the health benefits of quitting smoking, and the definition of COPD.   Nutrition I: Fats -Discuss the types of cholesterol, what cholesterol does to the heart, and how cholesterol levels can be controlled.   Nutrition II: Labels -Discuss the different components of food labels and how to read food label   Heart Parts/Heart Disease and PAD -Discuss the anatomy of the heart, the pathway of blood circulation through the heart, and these are affected by heart disease.   Stress I: Signs and Symptoms -Discuss the causes of stress, how stress may lead to anxiety and depression, and ways to limit stress.   Stress II: Relaxation -Discuss different types of relaxation techniques to limit stress.   Warning Signs of Stroke / TIA -Discuss definition of a stroke, what the signs and symptoms are of a stroke, and how to identify when someone is having stroke.   Knowledge Questionnaire Score:  Knowledge Questionnaire Score - 01/28/21 1100       Knowledge Questionnaire Score   Pre Score 21/24             Core Components/Risk Factors/Patient Goals at Admission:  Personal Goals and Risk Factors at Admission - 01/28/21 0938       Core Components/Risk Factors/Patient Goals on Admission    Weight Management Yes;Weight  Maintenance    Intervention Weight Management: Develop a combined nutrition and exercise program designed to reach desired caloric intake, while maintaining appropriate intake of nutrient and fiber, sodium and fats, and appropriate energy expenditure required for the weight goal.;Weight Management: Provide education and appropriate resources to help participant work on and attain dietary goals.    Admit Weight 143 lb 15.4 oz (65.3 kg)    Goal Weight: Short Term 140 lb (63.5 kg)    Goal Weight: Long Term 140 lb (63.5 kg)    Expected Outcomes Short Term: Continue to assess and modify interventions until short term weight is achieved;Long Term: Adherence to nutrition and physical activity/exercise program aimed toward attainment of established weight goal;Weight Maintenance: Understanding of the daily nutrition guidelines, which includes 25-35% calories from fat, 7% or less cal from saturated fats, less than 296m cholesterol, less than 1.5gm of sodium, & 5 or more servings of fruits and vegetables daily    Heart Failure Yes    Intervention Provide a combined exercise and nutrition program that is supplemented with education, support and counseling  about heart failure. Directed toward relieving symptoms such as shortness of breath, decreased exercise tolerance, and extremity edema.    Expected Outcomes Improve functional capacity of life;Short term: Attendance in program 2-3 days a week with increased exercise capacity. Reported lower sodium intake. Reported increased fruit and vegetable intake. Reports medication compliance.;Short term: Daily weights obtained and reported for increase. Utilizing diuretic protocols set by physician.;Long term: Adoption of self-care skills and reduction of barriers for early signs and symptoms recognition and intervention leading to self-care maintenance.    Hypertension Yes    Intervention Provide education on lifestyle modifcations including regular physical  activity/exercise, weight management, moderate sodium restriction and increased consumption of fresh fruit, vegetables, and low fat dairy, alcohol moderation, and smoking cessation.;Monitor prescription use compliance.    Expected Outcomes Short Term: Continued assessment and intervention until BP is < 140/42m HG in hypertensive participants. < 130/86mHG in hypertensive participants with diabetes, heart failure or chronic kidney disease.;Long Term: Maintenance of blood pressure at goal levels.    Stress Yes    Intervention Offer individual and/or small group education and counseling on adjustment to heart disease, stress management and health-related lifestyle change. Teach and support self-help strategies.;Refer participants experiencing significant psychosocial distress to appropriate mental health specialists for further evaluation and treatment. When possible, include family members and significant others in education/counseling sessions.    Expected Outcomes Short Term: Participant demonstrates changes in health-related behavior, relaxation and other stress management skills, ability to obtain effective social support, and compliance with psychotropic medications if prescribed.;Long Term: Emotional wellbeing is indicated by absence of clinically significant psychosocial distress or social isolation.    Personal Goal Other Yes    Personal Goal Be able to walk more comfortably.    Intervention Increase walking duration at home in addition to walking at cardiac rehab to build walking tolerance.    Expected Outcomes Patient will be able to walk more comforatbly. Patient's distance on 6-minute walk test will increase.             Core Components/Risk Factors/Patient Goals Review:   Goals and Risk Factor Review     Row Name 03/04/21 1353             Core Components/Risk Factors/Patient Goals Review   Personal Goals Review Weight Management/Obesity;Hypertension;Lipids       Review Sharna  has been doing well with exercise. Jamelia's vital signs and CBG's have been stable. Avayah's blood pressures continued to be better controlled.       Expected Outcomes Lavinia will continue to participate in phase 2 cardiac rehab for exercise, nutrition and lifestyle modificatoins.                Core Components/Risk Factors/Patient Goals at Discharge (Final Review):   Goals and Risk Factor Review - 03/04/21 1353       Core Components/Risk Factors/Patient Goals Review   Personal Goals Review Weight Management/Obesity;Hypertension;Lipids    Review Verline has been doing well with exercise. Tanveer's vital signs and CBG's have been stable. Jaden's blood pressures continued to be better controlled.    Expected Outcomes Jeannelle will continue to participate in phase 2 cardiac rehab for exercise, nutrition and lifestyle modificatoins.             ITP Comments:  ITP Comments     Row Name 01/28/21 [id number]/22/22 [id number] 03/04/21 1351       ITP Comments Medical Director- Dr. TrFransico HimMD 30 Day ITP Review, Reneshia started CR on 02/10/21  and did well with exercise. 30 Day ITP Review, Boneta has good attendance and participation in phase 2 cardiac rehab. Dorisann's blood pressures have been better controlled.              Comments: See ITP comments.Harrell Gave RN BSN

## 2021-03-05 ENCOUNTER — Encounter (HOSPITAL_COMMUNITY)
Admission: RE | Admit: 2021-03-05 | Discharge: 2021-03-05 | Disposition: A | Discharge status: Home / Self Care | Payer: Medicare HMO | Source: Ambulatory Visit | Attending: Cardiovascular Disease | Admitting: Cardiovascular Disease

## 2021-03-05 ENCOUNTER — Ambulatory Visit (HOSPITAL_COMMUNITY): Payer: Medicare HMO

## 2021-03-05 ENCOUNTER — Other Ambulatory Visit: Payer: Self-pay

## 2021-03-05 DIAGNOSIS — Z952 Presence of prosthetic heart valve: Secondary | ICD-10-CM

## 2021-03-07 ENCOUNTER — Encounter (HOSPITAL_COMMUNITY)
Admission: RE | Admit: 2021-03-07 | Discharge: 2021-03-07 | Disposition: A | Discharge status: Home / Self Care | Payer: Medicare HMO | Source: Ambulatory Visit | Attending: Cardiovascular Disease | Admitting: Cardiovascular Disease

## 2021-03-07 ENCOUNTER — Other Ambulatory Visit: Payer: Self-pay

## 2021-03-07 ENCOUNTER — Ambulatory Visit (HOSPITAL_COMMUNITY): Payer: Medicare HMO

## 2021-03-07 DIAGNOSIS — Z952 Presence of prosthetic heart valve: Secondary | ICD-10-CM

## 2021-03-10 ENCOUNTER — Ambulatory Visit (HOSPITAL_COMMUNITY): Payer: Medicare HMO

## 2021-03-10 ENCOUNTER — Other Ambulatory Visit: Payer: Self-pay

## 2021-03-10 ENCOUNTER — Encounter (HOSPITAL_COMMUNITY)
Admission: RE | Admit: 2021-03-10 | Discharge: 2021-03-10 | Disposition: A | Discharge status: Home / Self Care | Payer: Medicare HMO | Source: Ambulatory Visit | Attending: Cardiovascular Disease | Admitting: Cardiovascular Disease

## 2021-03-10 DIAGNOSIS — Z952 Presence of prosthetic heart valve: Secondary | ICD-10-CM

## 2021-03-12 ENCOUNTER — Other Ambulatory Visit: Payer: Self-pay | Admitting: Family Medicine

## 2021-03-12 ENCOUNTER — Encounter (HOSPITAL_COMMUNITY)
Admission: RE | Admit: 2021-03-12 | Discharge: 2021-03-12 | Disposition: A | Discharge status: Home / Self Care | Payer: Medicare HMO | Source: Ambulatory Visit | Attending: Cardiovascular Disease | Admitting: Cardiovascular Disease

## 2021-03-12 ENCOUNTER — Ambulatory Visit (HOSPITAL_COMMUNITY): Payer: Medicare HMO

## 2021-03-12 ENCOUNTER — Other Ambulatory Visit: Payer: Self-pay

## 2021-03-12 DIAGNOSIS — Z952 Presence of prosthetic heart valve: Secondary | ICD-10-CM | POA: Diagnosis not present

## 2021-03-12 MED ORDER — SPIRONOLACTONE 25 MG PO TABS: 25.0000 mg | ORAL_TABLET | Freq: Every day | ORAL | 3 refills | Status: DC

## 2021-03-12 NOTE — Telephone Encounter (Signed)
Last refill 11/07/20 #30/2 Dr. Roxy Manns Last office visit 12/18/20 Upcoming appointment 03/24/21

## 2021-03-12 NOTE — Telephone Encounter (Signed)
Pt called in requesting refills . Stated she;s all out of her medication    Encourage patient to contact the pharmacy for refills or they can request refills through [place]:  Please schedule appointment if longer than 1 year  NEXT APPOINTMENT DATE:  MEDICATION:spironolactone (ALDACTONE) 25 MG tablet   Is the patient out of medication?   [place]:  Let patient know to contact pharmacy at the end of the day to make sure medication is ready.  Please notify patient to allow 48-72 hours to process  CLINICAL FILLS OUT ALL BELOW:   LAST REFILL:  QTY:  REFILL DATE:    OTHER COMMENTS:    Okay for refill?  Please advise

## 2021-03-12 NOTE — Telephone Encounter (Signed)
I've refilled spironolactone.

## 2021-03-14 ENCOUNTER — Other Ambulatory Visit: Payer: Self-pay

## 2021-03-14 ENCOUNTER — Encounter (HOSPITAL_COMMUNITY)
Admission: RE | Admit: 2021-03-14 | Discharge: 2021-03-14 | Disposition: A | Discharge status: Home / Self Care | Payer: Medicare HMO | Source: Ambulatory Visit | Attending: Cardiovascular Disease | Admitting: Cardiovascular Disease

## 2021-03-14 ENCOUNTER — Ambulatory Visit (HOSPITAL_COMMUNITY): Payer: Medicare HMO

## 2021-03-14 DIAGNOSIS — Z952 Presence of prosthetic heart valve: Secondary | ICD-10-CM

## 2021-03-17 ENCOUNTER — Encounter (HOSPITAL_COMMUNITY)
Admission: RE | Admit: 2021-03-17 | Discharge: 2021-03-17 | Disposition: A | Discharge status: Home / Self Care | Payer: Medicare HMO | Source: Ambulatory Visit | Attending: Cardiovascular Disease | Admitting: Cardiovascular Disease

## 2021-03-17 ENCOUNTER — Other Ambulatory Visit: Payer: Self-pay

## 2021-03-17 ENCOUNTER — Ambulatory Visit (HOSPITAL_COMMUNITY): Payer: Medicare HMO

## 2021-03-17 DIAGNOSIS — Z952 Presence of prosthetic heart valve: Secondary | ICD-10-CM | POA: Diagnosis not present

## 2021-03-19 ENCOUNTER — Encounter (HOSPITAL_COMMUNITY)
Admission: RE | Admit: 2021-03-19 | Discharge: 2021-03-19 | Disposition: A | Discharge status: Home / Self Care | Payer: Medicare HMO | Source: Ambulatory Visit | Attending: Cardiovascular Disease | Admitting: Cardiovascular Disease

## 2021-03-19 ENCOUNTER — Other Ambulatory Visit: Payer: Self-pay

## 2021-03-19 ENCOUNTER — Ambulatory Visit (HOSPITAL_COMMUNITY): Payer: Medicare HMO

## 2021-03-19 DIAGNOSIS — Z952 Presence of prosthetic heart valve: Secondary | ICD-10-CM

## 2021-03-21 ENCOUNTER — Ambulatory Visit (INDEPENDENT_AMBULATORY_CARE_PROVIDER_SITE_OTHER): Payer: Medicare HMO

## 2021-03-21 ENCOUNTER — Other Ambulatory Visit: Payer: Self-pay

## 2021-03-21 ENCOUNTER — Ambulatory Visit (HOSPITAL_COMMUNITY): Payer: Medicare HMO

## 2021-03-21 ENCOUNTER — Ambulatory Visit: Payer: Medicare HMO | Admitting: Cardiovascular Disease

## 2021-03-21 ENCOUNTER — Encounter (HOSPITAL_COMMUNITY): Payer: Medicare HMO

## 2021-03-21 ENCOUNTER — Encounter: Payer: Self-pay | Admitting: Cardiovascular Disease

## 2021-03-21 VITALS — BP 149/74 | HR 56 | Ht 64.0 in | Wt 143.4 lb

## 2021-03-21 DIAGNOSIS — I10 Essential (primary) hypertension: Secondary | ICD-10-CM

## 2021-03-21 DIAGNOSIS — Z953 Presence of xenogenic heart valve: Secondary | ICD-10-CM | POA: Diagnosis not present

## 2021-03-21 DIAGNOSIS — I4819 Other persistent atrial fibrillation: Secondary | ICD-10-CM

## 2021-03-21 DIAGNOSIS — I272 Pulmonary hypertension, unspecified: Secondary | ICD-10-CM

## 2021-03-21 DIAGNOSIS — Z7901 Long term (current) use of anticoagulants: Secondary | ICD-10-CM

## 2021-03-21 DIAGNOSIS — I5032 Chronic diastolic (congestive) heart failure: Secondary | ICD-10-CM

## 2021-03-21 LAB — POCT INR: INR: 3.2 — AB (ref 2.0–3.0)

## 2021-03-21 NOTE — Progress Notes (Signed)
03/21/2021 Joanna Hood   [birth year]  [id number]  Primary Physician Ria Bush, MD Primary Cardiologist: Lorretta Harp MD Joanna Hood, [place]  HPI:  Joanna Hood is a 75 y.o.  moderately overweight African-American female who I initially met several years ago. I last saw her in the office  10/09/2020.  She is accompanied by her daughter Joanna Hood today.  We first met when she was admitted to [facility] with diastolic heart failure, hypertension and atrial fibrillation. She ultimately underwent cardiac catheterization revealing essentially normal coronary arteries and normal LV function by Dr. Ellyn Hack. Her A. Fib converted to sinus rhythm with the aid of IV Cardizem. Her blood pressure was better controlled. She saw Ellen Henri PA-C in the office  and again was in atrial fibrillation at which time her beta blocker was adjusted and Eliquis was added because of increased CHA2DSVASC2 score of 2. Since I saw her in the office she remained clinically stable. She denies chest pain or shortness of breath.   She remains in A. fib on Eliquis oral anticoagulation.  She did contract COVID-19 in late July 2020 and completed her quarantine a week ago.  She had mild flulike symptoms.  She did have a 2D echo performed 05/08/2015 that showed normal LV function with moderate mitral regurgitation and severe left atrial enlargement.    When I saw her 6 months ago she was complaining of dyspnea on exertion.  Her 2D echo confirmed severe MR.  I ultimately performed right and left heart cath on her 65/2/22 revealing normal coronary arteries and severe MR.  1 week later she underwent mitral valve replacement by Dr. Roxy Manns via median sternotomy on 10/28/2020.  She had a Medtronic Mosaic stented porcine bioprosthetic tissue valve placed (29 mm) along with a clipping of her left atrial appendage.  Her postop course was somewhat protracted.  She was discharged home 1 week later.  She was  difficult to wean off pressors postoperatively.  She has seen Dr. [name], her heart [facility] cardiologist approximonth ago and was doing well at that time.  She is participating cardiac rehab.  She no longer has peripheral edema like she did preoperatively.  She is on Coumadin anticoagulation.   Current Meds  Medication Sig   acetaminophen (TYLENOL) 325 MG tablet Take 650 mg by mouth every 6 (six) hours as needed (pain).   amLODipine (NORVASC) 10 MG tablet Take 1 tablet (10 mg total) by mouth daily.   amoxicillin (AMOXIL) 500 MG tablet Take 4 tablets (2,000 mg total) by mouth as needed (1 hour prior to dental work).   Calcium Carb-Cholecalciferol (CALCIUM-VITAMIN D) 600-400 MG-UNIT TABS Take 1 tablet by mouth daily.   cholecalciferol (VITAMIN D) 1000 UNITS tablet Take 1,000 Units by mouth daily.   ferrous sulfate 325 (65 FE) MG tablet Take 1 tablet (325 mg total) by mouth every Monday, Wednesday, and Friday.   folic acid (FOLVITE) 1 MG tablet Take 1 tablet (1 mg total) by mouth daily.   furosemide (LASIX) 20 MG tablet Take 10 mg by mouth daily.   levothyroxine (SYNTHROID) 125 MCG tablet Take 1 tablet (125 mcg total) by mouth daily before breakfast.   methotrexate (RHEUMATREX) 2.5 MG tablet Take 15 mg by mouth every Friday. Caution:Chemotherapy. Protect from light.   omeprazole (PRILOSEC) 20 MG capsule Take 1 capsule (20 mg total) by mouth daily.   sacubitril-valsartan (ENTRESTO) 97-103 MG Take 1 tablet by mouth 2 (two) times daily.   spironolactone (ALDACTONE)  25 MG tablet Take 1 tablet (25 mg total) by mouth daily.   warfarin (COUMADIN) 4 MG tablet Use 1 to 2 tablets daily or as directed by the coumadin clinic   Current Facility-Administered Medications for the 03/21/21 encounter (Office Visit) with Lorretta Harp, MD  Medication   0.9 %  sodium chloride infusion     Allergies  Allergen Reactions   Metronidazole Itching and Rash    Social History   Socioeconomic History   Marital  status: Single    Spouse name: Not on file   Number of children: 1   Years of education: Not on file   Highest education level: Not on file  Occupational History   Occupation: Retired  Tobacco Use   Smoking status: Former    Types: Cigarettes    Quit date: 05/16/1969    Years since quitting: 51.8   Smokeless tobacco: Former  Scientific laboratory technician Use: Never used  Substance and Sexual Activity   Alcohol use: No    Alcohol/week: 0.0 standard drinks   Drug use: No   Sexual activity: Not Currently  Other Topics Concern   Not on file  Social History Narrative   Lives with grand-daughter and godson   Occupation: retired, 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   Edu: [employer] Strain: Low Risk    Difficulty of Paying Living Expenses: Not hard at all  Food Insecurity: No Food Insecurity   Worried About Charity fundraiser in the Last Year: Never true   Arboriculturist in the Last Year: Never true  Transportation Needs: No Transportation Needs   Lack of Transportation (Medical): No   Lack of Transportation (Non-Medical): No  Physical Activity: Inactive   Days of Exercise per Week: 0 days   Minutes of Exercise per Session: 0 min  Stress: No Stress Concern Present   Feeling of Stress : Not at all  Social Connections: Not on file  Intimate Partner Violence: Not At Risk   Fear of Current or Ex-Partner: No   Emotionally Abused: No   Physically Abused: No   Sexually Abused: No     Review of Systems: General: negative for chills, fever, night sweats or weight changes.  Cardiovascular: negative for chest pain, dyspnea on exertion, edema, orthopnea, palpitations, paroxysmal nocturnal dyspnea or shortness of breath Dermatological: negative for rash Respiratory: negative for cough or wheezing Urologic: negative for hematuria Abdominal: negative for nausea, vomiting, diarrhea, bright red blood per rectum, melena,  or hematemesis Neurologic: negative for visual changes, syncope, or dizziness All other systems reviewed and are otherwise negative except as noted above.    Blood pressure (!) 160/64, pulse (!) 56, height 5' 4"  (1.626 m), weight 143 lb 6.4 oz (65 kg).  General appearance: alert and no distress Neck: no adenopathy, no JVD, supple, symmetrical, trachea midline, thyroid not enlarged, symmetric, no tenderness/mass/nodules, and soft bilateral carotid bruits Lungs: clear to auscultation bilaterally Heart: irregularly irregular rhythm and soft outflow tract murmur Extremities: extremities normal, atraumatic, no cyanosis or edema Pulses: 2+ and symmetric Skin: Skin color, texture, turgor normal. No rashes or lesions Neurologic: Grossly normal  EKG atrial fibrillation with ventricular sponsor 56 and nonspecific ST and T wave changes.  Personally reviewed this EKG.  ASSESSMENT AND PLAN:   Pulmonary hypertension ([place]) History of pulmonary hypertension probably secondary from severe MR most recently assessed by 2D echo 12/16/2020 revealing an RV  systolic pressure of 57 mmHg, significantly improved from prior echoes.  Persistent atrial fibrillation (HCC) History of persistent A. fib rate controlled on Coumadin anticoagulation.  She had her INR checked this morning.  HTN (hypertension), malignant History of essential hypertension a blood pressure measured today at 160/64.  She is on amlodipine, spironolactone and Entresto.  She does check her blood pressure at home and usually it is in the 852 systolic range.  Chronic heart failure with preserved ejection fraction (HFpEF) (HCC) Grade 3 diastolic dysfunction with normal systolic function by 2D echo 12/16/2020 on oral diuretics.  S/P mitral valve replacement with bioprosthetic valve Ms. Nott underwent right and left heart cath by myself 10/21/2020 revealing normal coronary arteries and severe MR.  1 week later she underwent mitral valve repair by Dr.  Roxy Manns with a Medtronic Mosaic stented porcine bioprosthetic tissue valve (29 mm) with clipping of her left atrial appendage as well.  This was done via a median sternotomy.  Her postop course was somewhat prolonged and she was discharged home approxi-1 week later.  She has followed up with Dr. [name] , her advanced heart failure cardiologist as well.  She is participating cardiac rehab.  2D echo performed 12/16/2020 revealed normal LV systolic function with a well-functioning mitral bioprosthesis.     Lorretta Harp MD FACP,FACC,FAHA, [facility] 03/21/2021 8:46 AM

## 2021-03-21 NOTE — Assessment & Plan Note (Signed)
Joanna Hood underwent right and left heart cath by myself 10/21/2020 revealing normal coronary arteries and severe MR.  1 week later she underwent mitral valve repair by Dr. Roxy Hood with a Medtronic Mosaic stented porcine bioprosthetic tissue valve (29 mm) with clipping of her left atrial appendage as well.  This was done via a median sternotomy.  Her postop course was somewhat prolonged and she was discharged home approxi-1 week later.  She has followed up with Dr. Haroldine Hood , her advanced heart failure cardiologist as well.  She is participating cardiac rehab.  2D echo performed 12/16/2020 revealed normal LV systolic function with a well-functioning mitral bioprosthesis.

## 2021-03-21 NOTE — Assessment & Plan Note (Signed)
History of essential hypertension a blood pressure measured today at 160/64.  She is on amlodipine, spironolactone and Entresto.  She does check her blood pressure at home and usually it is in the 829 systolic range.

## 2021-03-21 NOTE — Patient Instructions (Signed)
Continue taking 1 tablet Daily, except 0.5 on Wednesday  Recheck 5 weeks. Eat greens tonight.   [phone]

## 2021-03-21 NOTE — Assessment & Plan Note (Signed)
Grade 3 diastolic dysfunction with normal systolic function by 2D echo 12/16/2020 on oral diuretics.

## 2021-03-21 NOTE — Assessment & Plan Note (Signed)
History of pulmonary hypertension probably secondary from severe MR most recently assessed by 2D echo 12/16/2020 revealing an RV systolic pressure of 57 mmHg, significantly improved from prior echoes.

## 2021-03-21 NOTE — Assessment & Plan Note (Signed)
History of persistent A. fib rate controlled on Coumadin anticoagulation.  She had her INR checked this morning.

## 2021-03-21 NOTE — Patient Instructions (Signed)
Medication Instructions:  Your physician recommends that you continue on your current medications as directed. Please refer to the Current Medication list given to you today.  *If you need a refill on your cardiac medications before your next appointment, please call your pharmacy*   Testing/Procedures: Your physician has requested that you have an echocardiogram. Echocardiography is a painless test that uses sound waves to create images of your heart. It provides your doctor with information about the size and shape of your heart and how well your heart's chambers and valves are working. This procedure takes approximately one hour. There are no restrictions for this procedure. To be done June 2023. This procedure is done at [address].     Follow-Up: At [facility], you and your health needs are our priority.  As part of our continuing mission to provide you with exceptional heart care, we have created designated Provider Care Teams.  These Care Teams include your primary Cardiologist (physician) and Advanced Practice Providers (APPs -  Physician Assistants and Nurse Practitioners) who all work together to provide you with the care you need, when you need it.  We recommend signing up for the patient portal called "MyChart".  Sign up information is provided on this After Visit Summary.  MyChart is used to connect with patients for Virtual Visits (Telemedicine).  Patients are able to view lab/test results, encounter notes, upcoming appointments, etc.  Non-urgent messages can be sent to your provider as well.   To learn more about what you can do with MyChart, go to [email or web address].    Your next appointment:   12 month(s)  The format for your next appointment:   In Person  Provider:   Quay Burow, MD

## 2021-03-24 ENCOUNTER — Ambulatory Visit (HOSPITAL_COMMUNITY): Payer: Medicare HMO

## 2021-03-24 ENCOUNTER — Other Ambulatory Visit: Payer: Self-pay

## 2021-03-24 ENCOUNTER — Ambulatory Visit (INDEPENDENT_AMBULATORY_CARE_PROVIDER_SITE_OTHER): Payer: Medicare HMO | Admitting: Family Medicine

## 2021-03-24 ENCOUNTER — Encounter: Payer: Self-pay | Admitting: Family Medicine

## 2021-03-24 ENCOUNTER — Encounter (HOSPITAL_COMMUNITY): Payer: Medicare HMO

## 2021-03-24 VITALS — BP 140/64 | HR 54 | Temp 97.5°F | Ht 62.75 in | Wt 142.2 lb

## 2021-03-24 DIAGNOSIS — Z953 Presence of xenogenic heart valve: Secondary | ICD-10-CM | POA: Diagnosis not present

## 2021-03-24 DIAGNOSIS — I10 Essential (primary) hypertension: Secondary | ICD-10-CM | POA: Diagnosis not present

## 2021-03-24 DIAGNOSIS — I4819 Other persistent atrial fibrillation: Secondary | ICD-10-CM | POA: Diagnosis not present

## 2021-03-24 DIAGNOSIS — Z7901 Long term (current) use of anticoagulants: Secondary | ICD-10-CM

## 2021-03-24 DIAGNOSIS — Z23 Encounter for immunization: Secondary | ICD-10-CM | POA: Diagnosis not present

## 2021-03-24 DIAGNOSIS — M0579 Rheumatoid arthritis with rheumatoid factor of multiple sites without organ or systems involvement: Secondary | ICD-10-CM | POA: Diagnosis not present

## 2021-03-24 NOTE — Patient Instructions (Addendum)
Flu shot today  You are doing well today Return as needed or in 6 months (after 09/17/2020) for physical  Continue current medicines.

## 2021-03-24 NOTE — Assessment & Plan Note (Addendum)
Remains in rate controlled afib, continues coumadin.  INR managed through cardiology coumadin clinic.

## 2021-03-24 NOTE — Progress Notes (Signed)
Patient ID: Joanna Hood, female    DOB: [birth year], 75 y.o.   MRN: [id number]  This visit was conducted in person.  BP 140/64   Pulse (!) 54   Temp (!) 97.5 F (36.4 C) (Temporal)   Ht 5' 2.75" (1.594 m)   Wt 142 lb 3 oz (64.5 kg)   SpO2 95%   BMI 25.39 kg/m    CC: HTN f/u visit  Subjective:   HPI: Joanna Hood is a 75 y.o. 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 Continues cardiac rehab, followed by cards as well.   HTN - Compliant with current antihypertensive regimen of amlodipine 10mg  daily, lasix 10mg  daily, spironolactone 25mg  daily and entresto. Does check blood pressures at home: 130-140/60s.  No low blood pressure readings or symptoms of dizziness/syncope.  Denies HA, vision changes, CP/tightness, SOB, leg swelling.    Saw Dr Gwenlyn Found last week - good report, cleared for 1 year f/u.  Saw Dr Henrene Pastor - good report, stable CT scan. Planning colonoscopy later this year. Continues oral iron MWF.  Seeing rheum this Friday - continues MTX weekly.      Relevant past medical, surgical, family and social history reviewed and updated as indicated. Interim medical history since our last visit reviewed. Allergies and medications reviewed and updated. Outpatient Medications Prior to Visit  Medication Sig Dispense Refill   acetaminophen (TYLENOL) 325 MG tablet Take 650 mg by mouth every 6 (six) hours as needed (pain).     amLODipine (NORVASC) 10 MG tablet Take 1 tablet (10 mg total) by mouth daily. 30 tablet 3   amoxicillin (AMOXIL) 500 MG tablet Take 4 tablets (2,000 mg total) by mouth as needed (1 hour prior to dental work). 4 tablet 3   aspirin EC 81 MG tablet Take 81 mg by mouth daily. Swallow whole.     Calcium Carb-Cholecalciferol (CALCIUM-VITAMIN D)  600-400 MG-UNIT TABS Take 1 tablet by mouth daily.     cholecalciferol (VITAMIN D) 1000 UNITS tablet Take 1,000 Units by mouth daily.     ferrous sulfate 325 (65 FE) MG tablet Take 1 tablet (325 mg total) by mouth every Monday, Wednesday, and Friday.     folic acid (FOLVITE) 1 MG tablet Take 1 tablet (1 mg total) by mouth daily. 90 tablet 3   furosemide (LASIX) 20 MG tablet Take 10 mg by mouth daily.     levothyroxine (SYNTHROID) 125 MCG tablet Take 1 tablet (125 mcg total) by mouth daily before breakfast. 90 tablet 3   methotrexate (RHEUMATREX) 2.5 MG tablet Take 15 mg by mouth every Friday. Caution:Chemotherapy. Protect from light.     omeprazole (PRILOSEC) 20 MG capsule Take 1 capsule (20 mg total) by mouth daily. 30 capsule 11   sacubitril-valsartan (ENTRESTO) 97-103 MG Take 1 tablet by mouth 2 (two) times daily. 60 tablet 6   spironolactone (ALDACTONE) 25 MG tablet Take 1 tablet (25 mg total) by mouth daily. 30 tablet 3   warfarin (COUMADIN) 4 MG tablet Use 1 to 2 tablets daily or as directed by the coumadin clinic 60 tablet 0   Facility-Administered Medications Prior to Visit  Medication Dose Route Frequency Provider Last Rate Last Admin   0.9 %  sodium chloride infusion  500 mL Intravenous Continuous Irene Shipper, MD  Per HPI unless specifically indicated in ROS section below Review of Systems  Objective:  BP 140/64   Pulse (!) 54   Temp (!) 97.5 F (36.4 C) (Temporal)   Ht 5' 2.75" (1.594 m)   Wt 142 lb 3 oz (64.5 kg)   SpO2 95%   BMI 25.39 kg/m   Wt Readings from Last 3 Encounters:  03/24/21 142 lb 3 oz (64.5 kg)  03/21/21 143 lb 6.4 oz (65 kg)  02/06/21 142 lb 12.8 oz (64.8 kg)      Physical Exam Vitals and nursing note reviewed.  Constitutional:      Appearance: Normal appearance. She is not ill-appearing.  Cardiovascular:     Rate and Rhythm: Normal rate. Rhythm irregularly irregular.     Pulses: Normal pulses.     Heart sounds: Murmur (3/6 systolic best  at apex) heard.  Pulmonary:     Effort: Pulmonary effort is normal. No respiratory distress.     Breath sounds: Normal breath sounds. No wheezing, rhonchi or rales.  Musculoskeletal:     Right lower leg: No edema.     Left lower leg: No edema.  Skin:    General: Skin is warm and dry.     Findings: No rash.  Neurological:     Mental Status: She is alert.  Psychiatric:        Mood and Affect: Mood normal.        Behavior: Behavior normal.      Results for orders placed or performed in visit on 03/21/21  POCT INR  Result Value Ref Range   INR 3.2 (A) 2.0 - 3.0    Assessment & Plan:  This visit occurred during the SARS-CoV-2 public health emergency.  Safety protocols were in place, including screening questions prior to the visit, additional usage of staff PPE, and extensive cleaning of exam room while observing appropriate contact time as indicated for disinfecting solutions.   Problem List Items Addressed This Visit     Persistent atrial fibrillation ([place])    Remains in rate controlled afib, continues coumadin.  INR managed through cardiology coumadin clinic.       HTN (hypertension), malignant - Primary    Significant improvement on current regimen including daily lasix 10mg . Continue current regimen. States home readings stable at 283-151 systolic      Rheumatoid arthritis ([place])    Upcoming rheum appt. Continues MTX.      S/P mitral valve replacement with bioprosthetic valve   Long term (current) use of anticoagulants   Other Visit Diagnoses     Need for influenza vaccination       Relevant Orders   Flu Vaccine QUAD High Dose(Fluad) (Completed)        No orders of the defined types were placed in this encounter.  Orders Placed This Encounter  Procedures   Flu Vaccine QUAD High Dose(Fluad)     Patient Instructions  Flu shot today  You are doing well today Return as needed or in 6 months (after 09/17/2020) for physical  Continue current medicines.    Follow up plan: Return in about 6 months (around 09/22/2021) for annual exam, prior fasting for blood work, medicare wellness visit.  Ria Bush, MD

## 2021-03-24 NOTE — Assessment & Plan Note (Signed)
Upcoming rheum appt. Continues MTX.

## 2021-03-24 NOTE — Assessment & Plan Note (Addendum)
Significant improvement on current regimen including daily lasix 10mg . Continue current regimen. States home readings stable at 888-280 systolic

## 2021-03-26 ENCOUNTER — Encounter (HOSPITAL_COMMUNITY)
Admission: RE | Admit: 2021-03-26 | Discharge: 2021-03-26 | Disposition: A | Discharge status: Home / Self Care | Payer: Medicare HMO | Source: Ambulatory Visit | Attending: Cardiovascular Disease | Admitting: Cardiovascular Disease

## 2021-03-26 ENCOUNTER — Ambulatory Visit (HOSPITAL_COMMUNITY): Payer: Medicare HMO

## 2021-03-26 ENCOUNTER — Other Ambulatory Visit: Payer: Self-pay

## 2021-03-26 ENCOUNTER — Other Ambulatory Visit: Payer: Self-pay | Admitting: Cardiovascular Disease

## 2021-03-26 DIAGNOSIS — Z48812 Encounter for surgical aftercare following surgery on the circulatory system: Secondary | ICD-10-CM | POA: Insufficient documentation

## 2021-03-26 DIAGNOSIS — Z952 Presence of prosthetic heart valve: Secondary | ICD-10-CM | POA: Diagnosis not present

## 2021-03-28 ENCOUNTER — Encounter (HOSPITAL_COMMUNITY)
Admission: RE | Admit: 2021-03-28 | Discharge: 2021-03-28 | Disposition: A | Discharge status: Home / Self Care | Payer: Medicare HMO | Source: Ambulatory Visit | Attending: Cardiovascular Disease | Admitting: Cardiovascular Disease

## 2021-03-28 ENCOUNTER — Ambulatory Visit (HOSPITAL_COMMUNITY): Payer: Medicare HMO

## 2021-03-28 ENCOUNTER — Other Ambulatory Visit: Payer: Self-pay

## 2021-03-28 DIAGNOSIS — Z952 Presence of prosthetic heart valve: Secondary | ICD-10-CM

## 2021-03-28 DIAGNOSIS — Z6824 Body mass index (BMI) 24.0-24.9, adult: Secondary | ICD-10-CM | POA: Diagnosis not present

## 2021-03-28 DIAGNOSIS — Z48812 Encounter for surgical aftercare following surgery on the circulatory system: Secondary | ICD-10-CM | POA: Diagnosis not present

## 2021-03-28 DIAGNOSIS — Z1589 Genetic susceptibility to other disease: Secondary | ICD-10-CM | POA: Diagnosis not present

## 2021-03-28 DIAGNOSIS — M2550 Pain in unspecified joint: Secondary | ICD-10-CM | POA: Diagnosis not present

## 2021-03-28 DIAGNOSIS — Z79899 Other long term (current) drug therapy: Secondary | ICD-10-CM | POA: Diagnosis not present

## 2021-03-28 DIAGNOSIS — M0579 Rheumatoid arthritis with rheumatoid factor of multiple sites without organ or systems involvement: Secondary | ICD-10-CM | POA: Diagnosis not present

## 2021-03-31 ENCOUNTER — Ambulatory Visit (HOSPITAL_COMMUNITY): Payer: Medicare HMO

## 2021-03-31 ENCOUNTER — Other Ambulatory Visit: Payer: Self-pay

## 2021-03-31 ENCOUNTER — Encounter (HOSPITAL_COMMUNITY)
Admission: RE | Admit: 2021-03-31 | Discharge: 2021-03-31 | Disposition: A | Discharge status: Home / Self Care | Payer: Medicare HMO | Source: Ambulatory Visit | Attending: Cardiovascular Disease | Admitting: Cardiovascular Disease

## 2021-03-31 VITALS — Ht 62.75 in | Wt 141.5 lb

## 2021-03-31 DIAGNOSIS — Z952 Presence of prosthetic heart valve: Secondary | ICD-10-CM | POA: Diagnosis not present

## 2021-03-31 DIAGNOSIS — Z48812 Encounter for surgical aftercare following surgery on the circulatory system: Secondary | ICD-10-CM | POA: Diagnosis not present

## 2021-04-01 ENCOUNTER — Ambulatory Visit (HOSPITAL_COMMUNITY): Payer: Medicare HMO

## 2021-04-01 NOTE — Progress Notes (Signed)
Cardiac Individual Treatment Plan  Patient Details  Name: Joanna Hood MRN: [id number] Date of Birth: 1946-04-05 Referring Provider:   Flowsheet Row CARDIAC REHAB PHASE II ORIENTATION from 01/28/2021 in [facility]  Referring Provider Lorretta Harp, MD       Initial Encounter Date:  [place] from 01/28/2021 in [facility]  Date 01/28/21       Visit Diagnosis: 10/28/20 MVR (mitral valve replacement)  Patient's Home Medications on Admission:  Current Outpatient Medications:    acetaminophen (TYLENOL) 325 MG tablet, Take 650 mg by mouth every 6 (six) hours as needed (pain)., Disp: , Rfl:    amLODipine (NORVASC) 10 MG tablet, Take 1 tablet (10 mg total) by mouth daily., Disp: 30 tablet, Rfl: 3   amoxicillin (AMOXIL) 500 MG tablet, Take 4 tablets (2,000 mg total) by mouth as needed (1 hour prior to dental work)., Disp: 4 tablet, Rfl: 3   aspirin EC 81 MG tablet, Take 81 mg by mouth daily. Swallow whole., Disp: , Rfl:    Calcium Carb-Cholecalciferol (CALCIUM-VITAMIN D) 600-400 MG-UNIT TABS, Take 1 tablet by mouth daily., Disp: , Rfl:    cholecalciferol (VITAMIN D) 1000 UNITS tablet, Take 1,000 Units by mouth daily., Disp: , Rfl:    ferrous sulfate 325 (65 FE) MG tablet, Take 1 tablet (325 mg total) by mouth every Monday, Wednesday, and Friday., Disp: , Rfl:    folic acid (FOLVITE) 1 MG tablet, Take 1 tablet (1 mg total) by mouth daily., Disp: 90 tablet, Rfl: 3   furosemide (LASIX) 20 MG tablet, Take 10 mg by mouth daily., Disp: , Rfl:    levothyroxine (SYNTHROID) 125 MCG tablet, Take 1 tablet (125 mcg total) by mouth daily before breakfast., Disp: 90 tablet, Rfl: 3   methotrexate (RHEUMATREX) 2.5 MG tablet, Take 15 mg by mouth every Friday. Caution:Chemotherapy. Protect from light., Disp: , Rfl:    omeprazole (PRILOSEC) 20 MG capsule, Take 1 capsule (20 mg total) by mouth daily.,  Disp: 30 capsule, Rfl: 11   sacubitril-valsartan (ENTRESTO) 97-103 MG, Take 1 tablet by mouth 2 (two) times daily., Disp: 60 tablet, Rfl: 6   spironolactone (ALDACTONE) 25 MG tablet, Take 1 tablet (25 mg total) by mouth daily., Disp: 30 tablet, Rfl: 3   warfarin (COUMADIN) 4 MG tablet, TAKE 1 TO 2 TABLETS EVERY DAY  OR AS DIRECTED  BY  THE  COUMADIN  CLINIC, Disp: 120 tablet, Rfl: 1  Current Facility-Administered Medications:    0.9 %  sodium chloride infusion, 500 mL, Intravenous, Continuous, Irene Shipper, MD  Past Medical History: Past Medical History:  Diagnosis Date   Anemia    Atrial fibrillation ([place])    Atrial fibrillation with RVR ([place]) 2014   persistent   Chronic diastolic CHF (congestive heart failure) (HCC)    HTN   Chronic diastolic congestive heart failure ([place])    COVID-19 virus infection 01/20/2019   ESOPHAGEAL STRICTURE 02/19/2005   Qualifier: Diagnosis of  By: Amil Amen MD, Caleen Essex    Heart murmur    History of anemia    unclear cause, now resolved   History of chicken pox    History of colon polyps    benign   HLD (hyperlipidemia)    Hypothyroidism    Malignant hypertension longstanding   Microcytosis    Mitral valve regurgitation    Osteopenia 12/2013   T -2.3 hip  Osteoporosis    Pulmonary hypertension (HCC)    Rheumatoid arthritis ([place]) 11/2015   +RF, +CCP, ESR 49, synovitis on exam and [place] 11/2015 [facility])   Right lower lobe pneumonia 07/24/2014   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   29 mm Medtronic Mosaic stented porcine bioprosthetic tissue valve   Tricuspid regurgitation    Vitamin D deficiency     Tobacco Use: Social History   Tobacco Use  Smoking Status Former   Types: Cigarettes   Quit date: 05/16/1969   Years since quitting: 51.9  Smokeless Tobacco Former    Labs: Recent Government social research officer for [facility] Cardiac and Pulmonary Rehab Latest Ref Rng & Units 11/04/2020 11/05/2020  11/06/2020 11/06/2020 11/07/2020   Cholestrol 0 - 200 mg/dL - - - - -   LDLCALC 0 - 99 mg/dL - - - - -   HDL >39.00 mg/dL - - - - -   Trlycerides 0.0 - 149.0 mg/dL - - - - -   Hemoglobin A1c 4.8 - 5.6 % - - - - -   PHART 7.350 - 7.450 - - - - -   PCO2ART 32.0 - 48.0 mmHg - - - - -   HCO3 20.0 - 28.0 mmol/L - - - - -   TCO2 22 - 32 mmol/L - - - - -   ACIDBASEDEF 0.0 - 2.0 mmol/L - - - - -   O2SAT % 60.7 60.3 48.4 47.0 49.7       Capillary Blood Glucose: Lab Results  Component Value Date   GLUCAP 120 (H) 10/30/2020   GLUCAP 117 (H) 10/30/2020   GLUCAP 156 (H) 10/30/2020   GLUCAP 126 (H) 10/30/2020   GLUCAP 119 (H) 10/29/2020     Exercise Target Goals: Exercise Program Goal: Individual exercise prescription set using results from initial 6 min walk test and THRR while considering  patient's activity barriers and safety.   Exercise Prescription Goal: Initial exercise prescription builds to 30-45 minutes a day of aerobic activity, 2-3 days per week.  Home exercise guidelines will be given to patient during program as part of exercise prescription that the participant will acknowledge.  Activity Barriers & Risk Stratification:  Activity Barriers & Cardiac Risk Stratification - 01/28/21 0958       Activity Barriers & Cardiac Risk Stratification   Activity Barriers Arthritis;Other (comment)    Comments Rheumatoid arthritis. Left hip pain, occassionally "gives out".    Cardiac Risk Stratification High             6 Minute Walk:  6 Minute Walk     Row Name 01/28/21 1009 03/31/21 0915       6 Minute Walk   Phase Initial Discharge    Distance 1138 feet 1573 feet    Distance % Change -- 38.2 %    Distance Feet Change -- 435 ft    Walk Time 6 minutes 6 minutes    # of Rest Breaks 0 0    MPH 2.15 2.37    METS 3.19 3.25    RPE 11 9.5    Perceived Dyspnea  0 0    VO2 Peak 11.16 11.38    Symptoms No No    Resting HR 82 bpm 81 bpm    Resting BP 172/80 134/62    Resting  Oxygen Saturation  99 % 96 %    Exercise Oxygen Saturation  during 6 min walk 93 % 96 %  Max Ex. HR 119 bpm 125 bpm    Max Ex. BP 192/78 166/80    2 Minute Post BP 176/88 --             Oxygen Initial Assessment:   Oxygen Re-Evaluation:   Oxygen Discharge (Final Oxygen Re-Evaluation):   Initial Exercise Prescription:  Initial Exercise Prescription - 01/28/21 1000       Date of Initial Exercise RX and Referring Provider   Date 01/28/21    Referring Provider Lorretta Harp, MD    Expected Discharge Date 03/28/21      NuStep   Level 2    SPM 75    Minutes 15    METs 2      Track   Laps 13    Minutes 15    METs 2.51      Prescription Details   Frequency (times per week) 3    Duration Progress to 30 minutes of continuous aerobic without signs/symptoms of physical distress      Intensity   THRR 40-80% of Max Heartrate 59-118    Ratings of Perceived Exertion 11-13    Perceived Dyspnea 0-4      Progression   Progression Continue to progress workloads to maintain intensity without signs/symptoms of physical distress.      Resistance Training   Training Prescription Yes    Weight 2 lbs    Reps 10-15             Perform Capillary Blood Glucose checks as needed.  Exercise Prescription Changes:   Exercise Prescription Changes     Row Name 02/10/21 1000 02/26/21 1539 03/13/21 1100 03/31/21 0955       Response to Exercise   Blood Pressure (Admit) 138/64 130/68 138/60 134/62    Blood Pressure (Exercise) 162/60 148/78 148/72 166/80    Blood Pressure (Exit) 138/78 114/66 124/60 106/60    Heart Rate (Admit) 70 bpm 82 bpm 74 bpm 70 bpm    Heart Rate (Exercise) 104 bpm 116 bpm 114 bpm 125 bpm    Heart Rate (Exit) 75 bpm 74 bpm 74 bpm 60 bpm    Rating of Perceived Exertion (Exercise) _0 9.5    Symptoms None None None None    Comments Pt's first day in the CRP2 program Reviewed METs and home exercise Rx Reviewed METs and goals Post six minute walk  test    Duration Continue with 30 min of aerobic exercise without signs/symptoms of physical distress. Continue with 30 min of aerobic exercise without signs/symptoms of physical distress. Continue with 30 min of aerobic exercise without signs/symptoms of physical distress. Continue with 30 min of aerobic exercise without signs/symptoms of physical distress.    Intensity THRR unchanged THRR unchanged THRR unchanged THRR unchanged         Progression   Progression Continue to progress workloads to maintain intensity without signs/symptoms of physical distress. Continue to progress workloads to maintain intensity without signs/symptoms of physical distress. Continue to progress workloads to maintain intensity without signs/symptoms of physical distress. Continue to progress workloads to maintain intensity without signs/symptoms of physical distress.    Average METs 2.4 2.5 2.75 3.2         Resistance Training   Training Prescription Yes No No Yes    Weight 2 lbs No weights on Wednesdays No weights on Wednesdays 2 lbs    Reps 10-15 -- -- 10-15    Time 10 Minutes -- -- 10 Minutes  Interval Training   Interval Training No No No No         NuStep   Level _0 SPM 85 85 85 100    Minutes _1 METs 2.2 2.1 2.4 3.3         Track   Laps _2 Minutes _3 METs 2.62 2.86 3.09 3.09         Home Exercise Plan   Plans to continue exercise at -- Home (comment) Home (comment) Home (comment)    Frequency -- Add 2 additional days to program exercise sessions. Add 2 additional days to program exercise sessions. Add 2 additional days to program exercise sessions.    Initial Home Exercises Provided -- 02/26/21 02/26/21 02/26/21             Exercise Comments:   Exercise Comments     Row Name 02/10/21 1024 02/26/21 1548 03/12/21 1208 03/31/21 1015     Exercise Comments Pt's first day in the CRP2 program. Pt tolerated session well with no  complaints. Reviewed home exercise Rx with patient today. Pt verbalized understanding of the home exercise Rx and was provided a copy. Reviewed METS and goals. Progressing in her workloads and her ability to walk more comfortably. Pt achived 18 laps today. Pt completed post 6 minute walk test today. Pt has made outstanding progress here in the [facility] program. She she made a 38% (435 ft) improvement on the walk test with an RPE of 9.5. Pt will complete the program on 04/04/21.             Exercise Goals and Review:   Exercise Goals     Row Name 01/28/21 0939             Exercise Goals   Increase Physical Activity Yes       Intervention Provide advice, education, support and counseling about physical activity/exercise needs.;Develop an individualized exercise prescription for aerobic and resistive training based on initial evaluation findings, risk stratification, comorbidities and participant's personal goals.       Expected Outcomes Short Term: Attend rehab on a regular basis to increase amount of physical activity.;Long Term: Exercising regularly at least 3-5 days a week.;Long Term: Add in home exercise to make exercise part of routine and to increase amount of physical activity.       Increase Strength and Stamina Yes       Intervention Provide advice, education, support and counseling about physical activity/exercise needs.;Develop an individualized exercise prescription for aerobic and resistive training based on initial evaluation findings, risk stratification, comorbidities and participant's personal goals.       Expected Outcomes Short Term: Increase workloads from initial exercise prescription for resistance, speed, and METs.;Short Term: Perform resistance training exercises routinely during rehab and add in resistance training at home;Long Term: Improve cardiorespiratory fitness, muscular endurance and strength as measured by increased METs and functional capacity (6MWT)       Able to  understand and use rate of perceived exertion (RPE) scale Yes       Intervention Provide education and explanation on how to use RPE scale       Expected Outcomes Short Term: Able to use RPE daily in rehab to express subjective intensity level;Long Term:  Able to use RPE to guide intensity level when exercising independently       Knowledge and understanding of  Target Heart Rate Range (THRR) Yes       Intervention Provide education and explanation of THRR including how the numbers were predicted and where they are located for reference       Expected Outcomes Short Term: Able to state/look up THRR;Long Term: Able to use THRR to govern intensity when exercising independently;Short Term: Able to use daily as guideline for intensity in rehab       Able to check pulse independently Yes       Intervention Provide education and demonstration on how to check pulse in carotid and radial arteries.;Review the importance of being able to check your own pulse for safety during independent exercise       Expected Outcomes Short Term: Able to explain why pulse checking is important during independent exercise;Long Term: Able to check pulse independently and accurately       Understanding of Exercise Prescription Yes       Intervention Provide education, explanation, and written materials on patient's individual exercise prescription       Expected Outcomes Short Term: Able to explain program exercise prescription;Long Term: Able to explain home exercise prescription to exercise independently                Exercise Goals Re-Evaluation :  Exercise Goals Re-Evaluation     Row Name 02/10/21 1022 02/26/21 1544 03/12/21 1205         Exercise Goal Re-Evaluation   Exercise Goals Review Increase Physical Activity;Increase Strength and Stamina;Able to understand and use rate of perceived exertion (RPE) scale;Knowledge and understanding of Target Heart Rate Range (THRR);Understanding of Exercise Prescription  Increase Physical Activity;Increase Strength and Stamina;Able to understand and use rate of perceived exertion (RPE) scale;Knowledge and understanding of Target Heart Rate Range (THRR);Able to check pulse independently;Understanding of Exercise Prescription Increase Physical Activity;Increase Strength and Stamina;Able to understand and use rate of perceived exertion (RPE) scale;Knowledge and understanding of Target Heart Rate Range (THRR);Able to check pulse independently;Understanding of Exercise Prescription     Comments Pt's first day in the CRP2 program. Pt understands the exercise Rx, THRR, and RPE scale. Reviewed METs and home exercise Rx. Pt walks at home 2x/week with her cousin for about 20 minutes. Encouraged patient to increase to 30 minutes to achieve goal of exercise 5x/week 30-45 minutes. Reviewed METs and goals. Pt has been walking at home an addtional 2-3x/[place]eek for 20-30 minutes. Pt  voices making progress in her goal to walk more comfortably.     Expected Outcomes WIll continue to monitor patient and progress exercise workloads was tolerated. Pt will continue to walk at home on her own. Pt will continue to walk at home on her own.              Discharge Exercise Prescription (Final Exercise Prescription Changes):  Exercise Prescription Changes - 03/31/21 0955       Response to Exercise   Blood Pressure (Admit) 134/62    Blood Pressure (Exercise) 166/80    Blood Pressure (Exit) 106/60    Heart Rate (Admit) 70 bpm    Heart Rate (Exercise) 125 bpm    Heart Rate (Exit) 60 bpm    Rating of Perceived Exertion (Exercise) 9.5    Symptoms None    Comments Post six minute walk test    Duration Continue with 30 min of aerobic exercise without signs/symptoms of physical distress.    Intensity THRR unchanged      Progression   Progression Continue to progress workloads to  maintain intensity without signs/symptoms of physical distress.    Average METs 3.2      Resistance Training    Training Prescription Yes    Weight 2 lbs    Reps 10-15    Time 10 Minutes      Interval Training   Interval Training No      NuStep   Level 3    SPM 100    Minutes 15    METs 3.3      Track   Laps 18    Minutes 15    METs 3.09      Home Exercise Plan   Plans to continue exercise at Home (comment)    Frequency Add 2 additional days to program exercise sessions.    Initial Home Exercises Provided 02/26/21             Nutrition:  Target Goals: Understanding of nutrition guidelines, daily intake of sodium <1560m, cholesterol <2016m calories 30% from fat and 7% or less from saturated fats, daily to have 5 or more servings of fruits and vegetables.  Biometrics:  Pre Biometrics - 01/28/21 0937       Pre Biometrics   Waist Circumference 35.75 inches    Hip Circumference 40 inches    Waist to Hip Ratio 0.89 %    Triceps Skinfold 30 mm    % Body Fat 39.2 %    Grip Strength 18 kg    Flexibility 16 in    Single Leg Stand 1.53 seconds             Post Biometrics - 03/31/21 1010        Post  Biometrics   Height 5' 2.75" (1.594 m)    Weight 64.2 kg    Waist Circumference 37 inches    Hip Circumference 39.5 inches    Waist to Hip Ratio 0.94 %    BMI (Calculated) 25.27    Triceps Skinfold 29 mm    % Body Fat 39.3 %    Grip Strength 27.5 kg    Flexibility 17 in    Single Leg Stand 2.43 seconds             Nutrition Therapy Plan and Nutrition Goals:  Nutrition Therapy & Goals - 02/14/21 1005       Nutrition Therapy   Diet TLC      Personal Nutrition Goals   Nutrition Goal Pt to build a healthy plate including vegetables, fruits, whole grains, and low-fat dairy products in a heart healthy meal plan.      Intervention Plan   Intervention Prescribe, educate and counsel regarding individualized specific dietary modifications aiming towards targeted core components such as weight, hypertension, lipid management, diabetes, heart failure and other  comorbidities.;Nutrition handout(s) given to patient.    Expected Outcomes Long Term Goal: Adherence to prescribed nutrition plan.;Short Term Goal: A plan has been developed with personal nutrition goals set during dietitian appointment.             Nutrition Assessments:  MEDIFICTS Score Key: ?70 Need to make dietary changes  40-70 Heart Healthy Diet ? 40 Therapeutic Level Cholesterol Diet   Flowsheet Row CARDIAC REHAB PHASE II [facility] Total Score on Admission 74      Picture Your Plate Scores: <4<30nhealthy dietary pattern with much room for improvement. 41-50 Dietary pattern unlikely to meet recommendations for good health and room for improvement. 51-60  More healthful dietary pattern, with some room for improvement.  >60 Healthy dietary pattern, although there may be some specific behaviors that could be improved.    Nutrition Goals Re-Evaluation:  Nutrition Goals Re-Evaluation     [place] Name 02/14/21 1005 03/03/21 0943           Goals   Current Weight 142 lb (64.4 kg) 142 lb (64.4 kg)      Nutrition Goal -- Pt to build a healthy plate including vegetables, fruits, whole grains, and low-fat dairy products in a heart healthy meal plan.               Nutrition Goals Re-Evaluation:  Nutrition Goals Re-Evaluation     [place] Name 02/14/21 1005 03/03/21 0943           Goals   Current Weight 142 lb (64.4 kg) 142 lb (64.4 kg)      Nutrition Goal -- Pt to build a healthy plate including vegetables, fruits, whole grains, and low-fat dairy products in a heart healthy meal plan.               Nutrition Goals Discharge (Final Nutrition Goals Re-Evaluation):  Nutrition Goals Re-Evaluation - 03/03/21 0943       Goals   Current Weight 142 lb (64.4 kg)    Nutrition Goal Pt to build a healthy plate including vegetables, fruits, whole grains, and low-fat dairy products in a heart  healthy meal plan.             Psychosocial: Target Goals: Acknowledge presence or absence of significant depression and/or stress, maximize coping skills, provide positive support system. Participant is able to verbalize types and ability to use techniques and skills needed for reducing stress and depression.  Initial Review & Psychosocial Screening:  Initial Psych Review & Screening - 01/28/21 1228       Initial Review   Current issues with None Identified      Family Dynamics   Good Support System? Yes   Tasneem lives with her daughter and great grand children     Barriers   Psychosocial barriers to participate in program There are no identifiable barriers or psychosocial needs.      Screening Interventions   Interventions Encouraged to exercise             Quality of Life Scores:  Quality of Life - 01/28/21 1102       Quality of Life   Select Quality of Life      Quality of Life Scores   Health/Function Pre 29.1 %    Socioeconomic Pre 27 %    Psych/Spiritual Pre 29.14 %    Family Pre 27.6 %    GLOBAL Pre 28.4 %            Scores of 19 and below usually indicate a poorer quality of life in these areas.  A difference of  2-3 points is a clinically meaningful difference.  A difference of 2-3 points in the total score of the Quality of Life Index has been associated with significant improvement in overall quality of life, self-image, physical symptoms, and general health in studies assessing change in quality of life.  PHQ-9: Recent Review Flowsheet Data     Depression screen [facility] 2/9 03/28/2021 01/28/2021 09/04/2020 06/28/2020 06/02/2019   Decreased Interest 0 0 0 0 0   Down, Depressed, Hopeless 0 0 0 0 0   PHQ - 2 Score 0 0 0 0 0   Altered  sleeping - - 0 - 0   Tired, decreased energy - - 0 - 0   Change in appetite - - 0 - 0   Feeling bad or failure about yourself  - - 0 - 0   Trouble concentrating - - 0 - 0   Moving slowly or fidgety/restless - - 0 - 0    Suicidal thoughts - - 0 - 0   PHQ-9 Score - - 0 - 0   Difficult doing work/chores - - Not difficult at all - Not difficult at all      Interpretation of Total Score  Total Score Depression Severity:  1-4 = Minimal depression, 5-9 = Mild depression, 10-14 = Moderate depression, 15-19 = Moderately severe depression, 20-27 = Severe depression   Psychosocial Evaluation and Intervention:   Psychosocial Re-Evaluation:  Psychosocial Re-Evaluation     [place] Name 02/10/21 1206 03/04/21 1352 03/28/21 1147         Psychosocial Re-Evaluation   Current issues with None Identified None Identified None Identified     Interventions Encouraged to attend Cardiac Rehabilitation for the exercise Encouraged to attend Cardiac Rehabilitation for the exercise Encouraged to attend Cardiac Rehabilitation for the exercise     Continue Psychosocial Services  No Follow up required No Follow up required No Follow up required              Psychosocial Discharge (Final Psychosocial Re-Evaluation):  Psychosocial Re-Evaluation - 03/28/21 1147       Psychosocial Re-Evaluation   Current issues with None Identified    Interventions Encouraged to attend Cardiac Rehabilitation for the exercise    Continue Psychosocial Services  No Follow up required             Vocational Rehabilitation: Provide vocational rehab assistance to qualifying candidates.   Vocational Rehab Evaluation & Intervention:  Vocational Rehab - 01/28/21 1230       Initial Vocational Rehab Evaluation & Intervention   Assessment shows need for Vocational Rehabilitation No   Carmelia is retired and does not need vocational rehab at this time            Education: Education Goals: Education classes will be provided on a weekly basis, covering required topics. Participant will state understanding/return demonstration of topics presented.  Learning Barriers/Preferences:  Learning Barriers/Preferences - 01/28/21 1229        Learning Barriers/Preferences   Learning Barriers Sight   Wears glasses   Learning Preferences Pictoral;Skilled Demonstration             Education Topics: Count Your Pulse:  -Group instruction provided by verbal instruction, demonstration, patient participation and written materials to support subject.  Instructors address importance of being able to find your pulse and how to count your pulse when at home without a heart monitor.  Patients get hands on experience counting their pulse with staff help and individually.   Heart Attack, Angina, and Risk Factor Modification:  -Group instruction provided by verbal instruction, video, and written materials to support subject.  Instructors address signs and symptoms of angina and heart attacks.    Also discuss risk factors for heart disease and how to make changes to improve heart health risk factors.   Functional Fitness:  -Group instruction provided by verbal instruction, demonstration, patient participation, and written materials to support subject.  Instructors address safety measures for doing things around the house.  Discuss how to get up and down off the floor, how to pick things up properly, how to  safely get out of a chair without assistance, and balance training.   Meditation and Mindfulness:  -Group instruction provided by verbal instruction, patient participation, and written materials to support subject.  Instructor addresses importance of mindfulness and meditation practice to help reduce stress and improve awareness.  Instructor also leads participants through a meditation exercise.    Stretching for Flexibility and Mobility:  -Group instruction provided by verbal instruction, patient participation, and written materials to support subject.  Instructors lead participants through series of stretches that are designed to increase flexibility thus improving mobility.  These stretches are additional exercise for major muscle groups  that are typically performed during regular warm up and cool down.   Hands Only CPR:  -Group verbal, video, and participation provides a basic overview of AHA guidelines for community CPR. Role-play of emergencies allow participants the opportunity to practice calling for help and chest compression technique with discussion of AED use.   Hypertension: -Group verbal and written instruction that provides a basic overview of hypertension including the most recent diagnostic guidelines, risk factor reduction with self-care instructions and medication management.    Nutrition I class: Heart Healthy Eating:  -Group instruction provided by PowerPoint slides, verbal discussion, and written materials to support subject matter. The instructor gives an explanation and review of the Therapeutic Lifestyle Changes diet recommendations, which includes a discussion on lipid goals, dietary fat, sodium, fiber, plant stanol/sterol esters, sugar, and the components of a well-balanced, healthy diet.   Nutrition II class: Lifestyle Skills:  -Group instruction provided by PowerPoint slides, verbal discussion, and written materials to support subject matter. The instructor gives an explanation and review of label reading, grocery shopping for heart health, heart healthy recipe modifications, and ways to make healthier choices when eating out.   Diabetes Question & Answer:  -Group instruction provided by PowerPoint slides, verbal discussion, and written materials to support subject matter. The instructor gives an explanation and review of diabetes co-morbidities, pre- and post-prandial blood glucose goals, pre-exercise blood glucose goals, signs, symptoms, and treatment of hypoglycemia and hyperglycemia, and foot care basics.   Diabetes Blitz:  -Group instruction provided by PowerPoint slides, verbal discussion, and written materials to support subject matter. The instructor gives an explanation and review of the  physiology behind type 1 and type 2 diabetes, diabetes medications and rational behind using different medications, pre- and post-prandial blood glucose recommendations and Hemoglobin A1c goals, diabetes diet, and exercise including blood glucose guidelines for exercising safely.    Portion Distortion:  -Group instruction provided by PowerPoint slides, verbal discussion, written materials, and food models to support subject matter. The instructor gives an explanation of serving size versus portion size, changes in portions sizes over the last 20 years, and what consists of a serving from each food group.   Stress Management:  -Group instruction provided by verbal instruction, video, and written materials to support subject matter.  Instructors review role of stress in heart disease and how to cope with stress positively.     Exercising on Your Own:  -Group instruction provided by verbal instruction, power point, and written materials to support subject.  Instructors discuss benefits of exercise, components of exercise, frequency and intensity of exercise, and end points for exercise.  Also discuss use of nitroglycerin and activating EMS.  Review options of places to exercise outside of rehab.  Review guidelines for sex with heart disease.   Cardiac Drugs I:  -Group instruction provided by verbal instruction and written materials to support subject.  Instructor reviews cardiac drug classes: antiplatelets, anticoagulants, beta blockers, and statins.  Instructor discusses reasons, side effects, and lifestyle considerations for each drug class.   Cardiac Drugs II:  -Group instruction provided by verbal instruction and written materials to support subject.  Instructor reviews cardiac drug classes: angiotensin converting enzyme inhibitors (ACE-I), angiotensin II receptor blockers (ARBs), nitrates, and calcium channel blockers.  Instructor discusses reasons, side effects, and lifestyle considerations  for each drug class.   Anatomy and Physiology of the Circulatory System:  Group verbal and written instruction and models provide basic cardiac anatomy and physiology, with the coronary electrical and arterial systems. Review of: AMI, Angina, Valve disease, Heart Failure, Peripheral Artery Disease, Cardiac Arrhythmia, Pacemakers, and the ICD.   Other Education:  -Group or individual verbal, written, or video instructions that support the educational goals of the cardiac rehab program.   Holiday Eating Survival Tips:  -Group instruction provided by PowerPoint slides, verbal discussion, and written materials to support subject matter. The instructor gives patients tips, tricks, and techniques to help them not only survive but enjoy the holidays despite the onslaught of food that accompanies the holidays.   Knowledge Questionnaire Score:  Knowledge Questionnaire Score - 01/28/21 1100       Knowledge Questionnaire Score   Pre Score 21/24             Core Components/Risk Factors/Patient Goals at Admission:  Personal Goals and Risk Factors at Admission - 01/28/21 0938       Core Components/Risk Factors/Patient Goals on Admission    Weight Management Yes;Weight Maintenance    Intervention Weight Management: Develop a combined nutrition and exercise program designed to reach desired caloric intake, while maintaining appropriate intake of nutrient and fiber, sodium and fats, and appropriate energy expenditure required for the weight goal.;Weight Management: Provide education and appropriate resources to help participant work on and attain dietary goals.    Admit Weight 143 lb 15.4 oz (65.3 kg)    Goal Weight: Short Term 140 lb (63.5 kg)    Goal Weight: Long Term 140 lb (63.5 kg)    Expected Outcomes Short Term: Continue to assess and modify interventions until short term weight is achieved;Long Term: Adherence to nutrition and physical activity/exercise program aimed toward attainment of  established weight goal;Weight Maintenance: Understanding of the daily nutrition guidelines, which includes 25-35% calories from fat, 7% or less cal from saturated fats, less than 251m cholesterol, less than 1.5gm of sodium, & 5 or more servings of fruits and vegetables daily    Heart Failure Yes    Intervention Provide a combined exercise and nutrition program that is supplemented with education, support and counseling about heart failure. Directed toward relieving symptoms such as shortness of breath, decreased exercise tolerance, and extremity edema.    Expected Outcomes Improve functional capacity of life;Short term: Attendance in program 2-3 days a week with increased exercise capacity. Reported lower sodium intake. Reported increased fruit and vegetable intake. Reports medication compliance.;Short term: Daily weights obtained and reported for increase. Utilizing diuretic protocols set by physician.;Long term: Adoption of self-care skills and reduction of barriers for early signs and symptoms recognition and intervention leading to self-care maintenance.    Hypertension Yes    Intervention Provide education on lifestyle modifcations including regular physical activity/exercise, weight management, moderate sodium restriction and increased consumption of fresh fruit, vegetables, and low fat dairy, alcohol moderation, and smoking cessation.;Monitor prescription use compliance.    Expected Outcomes Short Term: Continued assessment and intervention until BP is <  140/42m HG in hypertensive participants. < 130/866mHG in hypertensive participants with diabetes, heart failure or chronic kidney disease.;Long Term: Maintenance of blood pressure at goal levels.    Stress Yes    Intervention Offer individual and/or small group education and counseling on adjustment to heart disease, stress management and health-related lifestyle change. Teach and support self-help strategies.;Refer participants experiencing  significant psychosocial distress to appropriate mental health specialists for further evaluation and treatment. When possible, include family members and significant others in education/counseling sessions.    Expected Outcomes Short Term: Participant demonstrates changes in health-related behavior, relaxation and other stress management skills, ability to obtain effective social support, and compliance with psychotropic medications if prescribed.;Long Term: Emotional wellbeing is indicated by absence of clinically significant psychosocial distress or social isolation.    Personal Goal Other Yes    Personal Goal Be able to walk more comfortably.    Intervention Increase walking duration at home in addition to walking at cardiac rehab to build walking tolerance.    Expected Outcomes Patient will be able to walk more comforatbly. Patient's distance on 6-minute walk test will increase.             Core Components/Risk Factors/Patient Goals Review:   Goals and Risk Factor Review     Row Name 03/04/21 1353 03/28/21 1147           Core Components/Risk Factors/Patient Goals Review   Personal Goals Review Weight Management/Obesity;Hypertension;Lipids Weight Management/Obesity;Hypertension;Lipids      Review Justyne has been doing well with exercise. Kristel's vital signs and CBG's have been stable. Devita's blood pressures continued to be better controlled. Kynedi has been doing well with exercise. Delita's vital signs and CBG's have been stable. Lemmie's will complete cardiac rehab on 04/04/21 and reports feeling stronger.      Expected Outcomes Damya will continue to participate in phase 2 cardiac rehab for exercise, nutrition and lifestyle modificatoins. Annis will continue to participate in phase 2 cardiac rehab for exercise, nutrition and lifestyle modificatoins.               Core Components/Risk Factors/Patient Goals at Discharge (Final Review):   Goals and Risk Factor Review - 03/28/21 1147        Core Components/Risk Factors/Patient Goals Review   Personal Goals Review Weight Management/Obesity;Hypertension;Lipids    Review Lucella has been doing well with exercise. Marisabel's vital signs and CBG's have been stable. Brei's will complete cardiac rehab on 04/04/21 and reports feeling stronger.    Expected Outcomes Akia will continue to participate in phase 2 cardiac rehab for exercise, nutrition and lifestyle modificatoins.             ITP Comments:  ITP Comments     Row Name 01/28/21 0937108/22/22 1205 03/04/21 1351 03/28/21 1144     ITP Comments Medical Director- Dr. TrFransico HimMD 30 Day ITP Review, Rebie started CR on 02/10/21 and did well with exercise. 30 Day ITP Review, Norie has good attendance and participation in phase 2 cardiac rehab. Hila's blood pressures have been better controlled. 30 Day ITP Review, Evgenia has good attendance and participation in phase 2 cardiac rehab. Jaila's vitals signs remain stable with an occasional exertional BP elevation. Devanie will complete phase 2 cardiac rehab on 04/04/21.             Comments: See ITP comments

## 2021-04-02 ENCOUNTER — Ambulatory Visit (HOSPITAL_COMMUNITY): Payer: Medicare HMO

## 2021-04-02 ENCOUNTER — Encounter (HOSPITAL_COMMUNITY)
Admission: RE | Admit: 2021-04-02 | Discharge: 2021-04-02 | Disposition: A | Discharge status: Home / Self Care | Payer: Medicare HMO | Source: Ambulatory Visit | Attending: Cardiovascular Disease | Admitting: Cardiovascular Disease

## 2021-04-02 ENCOUNTER — Other Ambulatory Visit: Payer: Self-pay

## 2021-04-02 DIAGNOSIS — Z952 Presence of prosthetic heart valve: Secondary | ICD-10-CM | POA: Diagnosis not present

## 2021-04-02 DIAGNOSIS — Z48812 Encounter for surgical aftercare following surgery on the circulatory system: Secondary | ICD-10-CM | POA: Diagnosis not present

## 2021-04-03 ENCOUNTER — Other Ambulatory Visit: Payer: Self-pay | Admitting: Family Medicine

## 2021-04-03 DIAGNOSIS — Z1231 Encounter for screening mammogram for malignant neoplasm of breast: Secondary | ICD-10-CM

## 2021-04-04 ENCOUNTER — Ambulatory Visit (HOSPITAL_COMMUNITY): Payer: Medicare HMO

## 2021-04-04 ENCOUNTER — Other Ambulatory Visit: Payer: Self-pay

## 2021-04-04 ENCOUNTER — Encounter (HOSPITAL_COMMUNITY)
Admission: RE | Admit: 2021-04-04 | Discharge: 2021-04-04 | Disposition: A | Discharge status: Home / Self Care | Payer: Medicare HMO | Source: Ambulatory Visit | Attending: Cardiovascular Disease | Admitting: Cardiovascular Disease

## 2021-04-04 DIAGNOSIS — Z48812 Encounter for surgical aftercare following surgery on the circulatory system: Secondary | ICD-10-CM | POA: Diagnosis not present

## 2021-04-04 DIAGNOSIS — Z952 Presence of prosthetic heart valve: Secondary | ICD-10-CM | POA: Diagnosis not present

## 2021-04-07 ENCOUNTER — Ambulatory Visit (HOSPITAL_COMMUNITY): Payer: Medicare HMO

## 2021-04-09 ENCOUNTER — Ambulatory Visit (HOSPITAL_COMMUNITY): Payer: Medicare HMO

## 2021-04-11 ENCOUNTER — Ambulatory Visit (HOSPITAL_COMMUNITY): Payer: Medicare HMO

## 2021-04-11 NOTE — Progress Notes (Signed)
Discharge Progress Report  Patient Details  Name: Joanna Hood MRN: [id number] Date of Birth: [birth year] Referring Provider:   Flowsheet Row CARDIAC REHAB PHASE II ORIENTATION from 01/28/2021 in [place]  Referring Provider Lorretta Harp, MD        Number of Visits: 20  Reason for Discharge:  Patient reached a stable level of exercise. Patient independent in their exercise. Patient has met program and personal goals.  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)  ADL [facility]:   Initial Exercise Prescription:  Initial Exercise Prescription - 01/28/21 1000       Date of Initial Exercise RX and Referring Provider   Date 01/28/21    Referring Provider Lorretta Harp, MD    Expected Discharge Date 03/28/21      NuStep   Level 2    SPM 75    Minutes 15    METs 2      Track   Laps 13    Minutes 15    METs 2.51      Prescription Details   Frequency (times per week) 3    Duration Progress to 30 minutes of continuous aerobic without signs/symptoms of physical distress      Intensity   THRR 40-80% of Max Heartrate 59-118    Ratings of Perceived Exertion 11-13    Perceived Dyspnea 0-4      Progression   Progression Continue to progress workloads to maintain intensity without signs/symptoms of physical distress.      Resistance Training   Training Prescription Yes    Weight 2 lbs    Reps 10-15             Discharge Exercise Prescription (Final Exercise Prescription Changes):  Exercise Prescription Changes - 04/04/21 1000       Response to Exercise   Blood Pressure (Admit) 130/70    Blood Pressure (Exercise) 178/80    Blood Pressure (Exit) 118/66    Heart Rate (Admit) 68 bpm    Heart Rate (Exercise) 118 bpm    Heart Rate  (Exit) 67 bpm    Rating of Perceived Exertion (Exercise) 9    Symptoms None    Comments Pt graduated from the CRP2 program today    Duration Continue with 30 min of aerobic exercise without signs/symptoms of physical distress.    Intensity THRR unchanged      Progression   Progression Continue to progress workloads to maintain intensity without signs/symptoms of physical distress.    Average METs 3.55      Resistance Training   Training Prescription Yes    Weight 3 lbs    Reps 10-15    Time 10 Minutes      Interval Training   Interval Training No      NuStep   Level 3    SPM 100    Minutes 15    METs 3.9      Track   Laps 19    Minutes 15    METs 3.2      Home Exercise Plan   Plans to continue exercise at Home (comment)    Frequency Add 2 additional days to program exercise sessions.    Initial Home Exercises Provided 02/26/21  Functional Capacity:  6 Minute Walk     Row Name 01/28/21 1009 03/31/21 0915       6 Minute Walk   Phase Initial Discharge    Distance 1138 feet 1573 feet    Distance % Change -- 38.2 %    Distance Feet Change -- 435 ft    Walk Time 6 minutes 6 minutes    # of Rest Breaks 0 0    MPH 2.15 2.37    METS 3.19 3.25    RPE 11 9.5    Perceived Dyspnea  0 0    VO2 Peak 11.16 11.38    Symptoms No No    Resting HR 82 bpm 81 bpm    Resting BP 172/80 134/62    Resting Oxygen Saturation  99 % 96 %    Exercise Oxygen Saturation  during 6 min walk 93 % 96 %    Max Ex. HR 119 bpm 125 bpm    Max Ex. BP 192/78 166/80    2 Minute Post BP 176/88 --             Psychological, QOL, Others - Outcomes: PHQ 2/9: Depression screen [facility] 2/9 03/28/2021 01/28/2021 09/04/2020 06/28/2020 06/02/2019  Decreased Interest 0 0 0 0 0  Down, Depressed, Hopeless 0 0 0 0 0  PHQ - 2 Score 0 0 0 0 0  Altered sleeping - - 0 - 0  Tired, decreased energy - - 0 - 0  Change in appetite - - 0 - 0  Feeling bad or failure about yourself  - - 0 - 0   Trouble concentrating - - 0 - 0  Moving slowly or fidgety/restless - - 0 - 0  Suicidal thoughts - - 0 - 0  PHQ-9 Score - - 0 - 0  Difficult doing work/chores - - Not difficult at all - Not difficult at all  Some recent data might be hidden    Quality of Life:  Quality of Life - 04/04/21 1038       Quality of Life Scores   Health/Function Pre 29.1 %    Health/Function Post 29.83 %    Health/Function % Change 2.51 %    Socioeconomic Pre 27 %    Socioeconomic Post 29.14 %    Socioeconomic % Change  7.93 %    Psych/Spiritual Pre 29.14 %    Psych/Spiritual Post 30 %    Psych/Spiritual % Change 2.95 %    Family Pre 27.6 %    Family Post 30 %    Family % Change 8.7 %    GLOBAL Pre 28.4 %    GLOBAL Post 29.75 %    GLOBAL % Change 4.75 %             Personal Goals: Goals established at orientation with interventions provided to work toward goal.  Personal Goals and Risk Factors at Admission - 01/28/21 0938       Core Components/Risk Factors/Patient Goals on Admission    Weight Management Yes;Weight Maintenance    Intervention Weight Management: Develop a combined nutrition and exercise program designed to reach desired caloric intake, while maintaining appropriate intake of nutrient and fiber, sodium and fats, and appropriate energy expenditure required for the weight goal.;Weight Management: Provide education and appropriate resources to help participant work on and attain dietary goals.    Admit Weight 143 lb 15.4 oz (65.3 kg)    Goal Weight: Short Term 140 lb (63.5 kg)  Goal Weight: Long Term 140 lb (63.5 kg)    Expected Outcomes Short Term: Continue to assess and modify interventions until short term weight is achieved;Long Term: Adherence to nutrition and physical activity/exercise program aimed toward attainment of established weight goal;Weight Maintenance: Understanding of the daily nutrition guidelines, which includes 25-35% calories from fat, 7% or less cal from  saturated fats, less than 262m cholesterol, less than 1.5gm of sodium, & 5 or more servings of fruits and vegetables daily    Heart Failure Yes    Intervention Provide a combined exercise and nutrition program that is supplemented with education, support and counseling about heart failure. Directed toward relieving symptoms such as shortness of breath, decreased exercise tolerance, and extremity edema.    Expected Outcomes Improve functional capacity of life;Short term: Attendance in program 2-3 days a week with increased exercise capacity. Reported lower sodium intake. Reported increased fruit and vegetable intake. Reports medication compliance.;Short term: Daily weights obtained and reported for increase. Utilizing diuretic protocols set by physician.;Long term: Adoption of self-care skills and reduction of barriers for early signs and symptoms recognition and intervention leading to self-care maintenance.    Hypertension Yes    Intervention Provide education on lifestyle modifcations including regular physical activity/exercise, weight management, moderate sodium restriction and increased consumption of fresh fruit, vegetables, and low fat dairy, alcohol moderation, and smoking cessation.;Monitor prescription use compliance.    Expected Outcomes Short Term: Continued assessment and intervention until BP is < 140/933mHG in hypertensive participants. < 130/8036mG in hypertensive participants with diabetes, heart failure or chronic kidney disease.;Long Term: Maintenance of blood pressure at goal levels.    Stress Yes    Intervention Offer individual and/or small group education and counseling on adjustment to heart disease, stress management and health-related lifestyle change. Teach and support self-help strategies.;Refer participants experiencing significant psychosocial distress to appropriate mental health specialists for further evaluation and treatment. When possible, include family members and  significant others in education/counseling sessions.    Expected Outcomes Short Term: Participant demonstrates changes in health-related behavior, relaxation and other stress management skills, ability to obtain effective social support, and compliance with psychotropic medications if prescribed.;Long Term: Emotional wellbeing is indicated by absence of clinically significant psychosocial distress or social isolation.    Personal Goal Other Yes    Personal Goal Be able to walk more comfortably.    Intervention Increase walking duration at home in addition to walking at cardiac rehab to build walking tolerance.    Expected Outcomes Patient will be able to walk more comforatbly. Patient's distance on 6-minute walk test will increase.              Personal Goals Discharge:  Goals and Risk Factor Review     Row Name 03/04/21 1353 03/28/21 1147 04/04/21 1114         Core Components/Risk Factors/Patient Goals Review   Personal Goals Review Weight Management/Obesity;Hypertension;Lipids Weight Management/Obesity;Hypertension;Lipids Weight Management/Obesity;Hypertension;Lipids     Review Shaelyn has been doing well with exercise. Deniese's vital signs and CBG's have been stable. Penney's blood pressures continued to be better controlled. Gene has been doing well with exercise. Judene's vital signs and CBG's have been stable. Renise's will complete cardiac rehab on 04/04/21 and reports feeling stronger. Damya graduates today with the completion of 20 exercise sessions.  Shaquana is commeded on her progress she made as evident by increased MET levels and distance ambulated during the post walk test. Nhyira's vital signs stable with the maintenance of her weight.  Expected Outcomes Jannice will continue to participate in phase 2 cardiac rehab for exercise, nutrition and lifestyle modificatoins. Trinidi will continue to participate in phase 2 cardiac rehab for exercise, nutrition and lifestyle modificatoins. Marvalene  plans to continue exercising with walking with her cousin. Has a plan for cold winter days to walk at the mall.              Exercise Goals and Review:  Exercise Goals     Row Name 01/28/21 0939             Exercise Goals   Increase Physical Activity Yes       Intervention Provide advice, education, support and counseling about physical activity/exercise needs.;Develop an individualized exercise prescription for aerobic and resistive training based on initial evaluation findings, risk stratification, comorbidities and participant's personal goals.       Expected Outcomes Short Term: Attend rehab on a regular basis to increase amount of physical activity.;Long Term: Exercising regularly at least 3-5 days a week.;Long Term: Add in home exercise to make exercise part of routine and to increase amount of physical activity.       Increase Strength and Stamina Yes       Intervention Provide advice, education, support and counseling about physical activity/exercise needs.;Develop an individualized exercise prescription for aerobic and resistive training based on initial evaluation findings, risk stratification, comorbidities and participant's personal goals.       Expected Outcomes Short Term: Increase workloads from initial exercise prescription for resistance, speed, and METs.;Short Term: Perform resistance training exercises routinely during rehab and add in resistance training at home;Long Term: Improve cardiorespiratory fitness, muscular endurance and strength as measured by increased METs and functional capacity (6MWT)       Able to understand and use rate of perceived exertion (RPE) scale Yes       Intervention Provide education and explanation on how to use RPE scale       Expected Outcomes Short Term: Able to use RPE daily in rehab to express subjective intensity level;Long Term:  Able to use RPE to guide intensity level when exercising independently       Knowledge and understanding of  Target Heart Rate Range (THRR) Yes       Intervention Provide education and explanation of THRR including how the numbers were predicted and where they are located for reference       Expected Outcomes Short Term: Able to state/look up THRR;Long Term: Able to use THRR to govern intensity when exercising independently;Short Term: Able to use daily as guideline for intensity in rehab       Able to check pulse independently Yes       Intervention Provide education and demonstration on how to check pulse in carotid and radial arteries.;Review the importance of being able to check your own pulse for safety during independent exercise       Expected Outcomes Short Term: Able to explain why pulse checking is important during independent exercise;Long Term: Able to check pulse independently and accurately       Understanding of Exercise Prescription Yes       Intervention Provide education, explanation, and written materials on patient's individual exercise prescription       Expected Outcomes Short Term: Able to explain program exercise prescription;Long Term: Able to explain home exercise prescription to exercise independently                Exercise Goals Re-Evaluation:  Exercise Goals Re-Evaluation  [place] Name 02/10/21 1022 02/26/21 1544 03/12/21 1205 04/04/21 1039       Exercise Goal Re-Evaluation   Exercise Goals Review Increase Physical Activity;Increase Strength and Stamina;Able to understand and use rate of perceived exertion (RPE) scale;Knowledge and understanding of Target Heart Rate Range (THRR);Understanding of Exercise Prescription Increase Physical Activity;Increase Strength and Stamina;Able to understand and use rate of perceived exertion (RPE) scale;Knowledge and understanding of Target Heart Rate Range (THRR);Able to check pulse independently;Understanding of Exercise Prescription Increase Physical Activity;Increase Strength and Stamina;Able to understand and use rate of perceived  exertion (RPE) scale;Knowledge and understanding of Target Heart Rate Range (THRR);Able to check pulse independently;Understanding of Exercise Prescription Increase Physical Activity;Increase Strength and Stamina;Able to understand and use rate of perceived exertion (RPE) scale;Knowledge and understanding of Target Heart Rate Range (THRR);Able to check pulse independently    Comments Pt's first day in the CRP2 program. Pt understands the exercise Rx, THRR, and RPE scale. Reviewed METs and home exercise Rx. Pt walks at home 2x/week with her cousin for about 20 minutes. Encouraged patient to increase to 30 minutes to achieve goal of exercise 5x/week 30-45 minutes. Reviewed METs and goals. Pt has been walking at home an addtional 2-3x/weeek for 20-30 minutes. Pt  voices making progress in her goal to walk more comfortably. Pt graduated from the [place] program today. She had peak METs of 3.9. Pt made excellent progress in the CRP2 program. She achieved her personal goals of walking better and maintaining her weight. She averaged (.73-.81 miles) on her 15 minute walks here in [place].    Expected Outcomes WIll continue to monitor patient and progress exercise workloads was tolerated. Pt will continue to walk at home on her own. Pt will continue to walk at home on her own. Pt will continue to walk at home on her own.             Nutrition & Weight - Outcomes:  Pre Biometrics - 01/28/21 0937       Pre Biometrics   Waist Circumference 35.75 inches    Hip Circumference 40 inches    Waist to Hip Ratio 0.89 %    Triceps Skinfold 30 mm    % Body Fat 39.2 %    Grip Strength 18 kg    Flexibility 16 in    Single Leg Stand 1.53 seconds             Post Biometrics - 03/31/21 1010        Post  Biometrics   Height 5' 2.75" (1.594 m)    Weight 64.2 kg    Waist Circumference 37 inches    Hip Circumference 39.5 inches    Waist to Hip Ratio 0.94 %    BMI (Calculated) 25.27    Triceps Skinfold 29 mm    %  Body Fat 39.3 %    Grip Strength 27.5 kg    Flexibility 17 in    Single Leg Stand 2.43 seconds             Nutrition:  Nutrition Therapy & Goals - 02/14/21 1005       Nutrition Therapy   Diet TLC      Personal Nutrition Goals   Nutrition Goal Pt to build a healthy plate including vegetables, fruits, whole grains, and low-fat dairy products in a heart healthy meal plan.      Intervention Plan   Intervention Prescribe, educate and counsel regarding individualized specific dietary modifications aiming towards targeted core components such  as weight, hypertension, lipid management, diabetes, heart failure and other comorbidities.;Nutrition handout(s) given to patient.    Expected Outcomes Long Term Goal: Adherence to prescribed nutrition plan.;Short Term Goal: A plan has been developed with personal nutrition goals set during dietitian appointment.             Nutrition Discharge:   Education Questionnaire Score:  Knowledge Questionnaire Score - 04/04/21 1037       Knowledge Questionnaire Score   Post Score 23/24             Goals reviewed with patient; copy given to patient.Pt graduated from cardiac rehab program on 04/04/21 with completion of 20 exercise sessions in Phase II. Pt maintained good attendance and progressed nicely during his participation in rehab as evidenced by increased MET level.   Medication list reconciled. Repeat  PHQ score- 0 .  Pt has made significant lifestyle changes and should be commended for her success. Pt feels she has achieved her goals during cardiac rehab. [place] increased her distance on her post exercise walk test by 435 feet.  Taqwa made excellent progress and maintained her weight. Sophiya plans to continue exercising by walking. We are proud of Citlali's progress!Harrell Gave RN BSN

## 2021-04-14 ENCOUNTER — Ambulatory Visit (HOSPITAL_COMMUNITY): Payer: Medicare HMO

## 2021-04-16 ENCOUNTER — Ambulatory Visit (HOSPITAL_COMMUNITY): Payer: Medicare HMO

## 2021-04-18 ENCOUNTER — Ambulatory Visit (HOSPITAL_COMMUNITY): Payer: Medicare HMO

## 2021-04-21 ENCOUNTER — Telehealth: Payer: Self-pay | Admitting: Family Medicine

## 2021-04-21 NOTE — Chronic Care Management (AMB) (Signed)
  Chronic Care Management   Outreach Note  04/21/2021 Name: Joanna Hood MRN: [id number] DOB: [birth year]  Referred by: Ria Bush, MD Reason for referral : No chief complaint on file.   An unsuccessful telephone outreach was attempted today. The patient was referred to the pharmacist for assistance with care management and care coordination.   Follow Up Plan:   Tatjana Dellinger Upstream Scheduler

## 2021-04-22 ENCOUNTER — Telehealth: Payer: Self-pay | Admitting: Family Medicine

## 2021-04-22 NOTE — Progress Notes (Signed)
  Chronic Care Management   Note  04/22/2021 Name: Joanna Hood MRN: [id number] DOB: 1945/07/03  Joanna Hood is a 75 y.o. year old female who is a primary care patient of Joanna Bush, MD. I reached out to Joanna Hood by phone today in response to a referral sent by Joanna Hood PCP, Joanna Bush, MD.   Joanna Hood was given information about Chronic Care Management services today including:  CCM service includes personalized support from designated clinical staff supervised by her physician, including individualized plan of care and coordination with other care providers 24/7 contact phone numbers for assistance for urgent and routine care needs. Service will only be billed when office clinical staff spend 20 minutes or more in a month to coordinate care. Only one practitioner may furnish and bill the service in a calendar month. The patient may stop CCM services at any time (effective at the end of the month) by phone call to the office staff.   Patient agreed to services and verbal consent obtained.   Follow up plan:PT [place] 2023 MAY HAVE A COPAY SHE WILL LET us KNOW WHAT PLAN SHE CHOSES   Tatjana Dellinger Upstream Scheduler

## 2021-04-25 ENCOUNTER — Other Ambulatory Visit: Payer: Self-pay

## 2021-04-25 ENCOUNTER — Ambulatory Visit: Payer: Medicare HMO

## 2021-04-25 DIAGNOSIS — Z7901 Long term (current) use of anticoagulants: Secondary | ICD-10-CM | POA: Diagnosis not present

## 2021-04-25 DIAGNOSIS — I4819 Other persistent atrial fibrillation: Secondary | ICD-10-CM | POA: Diagnosis not present

## 2021-04-25 DIAGNOSIS — Z953 Presence of xenogenic heart valve: Secondary | ICD-10-CM

## 2021-04-25 LAB — POCT INR: INR: 1.7 — AB (ref 2.0–3.0)

## 2021-04-25 NOTE — Addendum Note (Signed)
Encounter addended by: Lesly Rubenstein on: 04/25/2021 8:27 AM  Actions taken: Flowsheet accepted

## 2021-04-25 NOTE — Patient Instructions (Signed)
Take 2 tablets tonight only and then Continue taking 1 tablet Daily, except 0.5 on Wednesday  Recheck 3 weeks.    [id number]

## 2021-04-29 DIAGNOSIS — H11823 Conjunctivochalasis, bilateral: Secondary | ICD-10-CM | POA: Diagnosis not present

## 2021-04-29 DIAGNOSIS — H04123 Dry eye syndrome of bilateral lacrimal glands: Secondary | ICD-10-CM | POA: Diagnosis not present

## 2021-05-06 ENCOUNTER — Ambulatory Visit
Admission: RE | Admit: 2021-05-06 | Discharge: 2021-05-06 | Disposition: A | Discharge status: Home / Self Care | Payer: Medicare HMO | Source: Ambulatory Visit | Attending: Family Medicine | Admitting: Family Medicine

## 2021-05-06 ENCOUNTER — Encounter: Payer: Self-pay | Admitting: Internal Medicine

## 2021-05-06 ENCOUNTER — Ambulatory Visit: Payer: Medicare HMO | Admitting: Internal Medicine

## 2021-05-06 VITALS — BP 142/80 | HR 64 | Ht 62.75 in | Wt 144.0 lb

## 2021-05-06 DIAGNOSIS — Z1231 Encounter for screening mammogram for malignant neoplasm of breast: Secondary | ICD-10-CM

## 2021-05-06 DIAGNOSIS — R634 Abnormal weight loss: Secondary | ICD-10-CM

## 2021-05-06 DIAGNOSIS — Z86010 Personal history of colonic polyps: Secondary | ICD-10-CM

## 2021-05-06 DIAGNOSIS — D649 Anemia, unspecified: Secondary | ICD-10-CM | POA: Diagnosis not present

## 2021-05-06 DIAGNOSIS — Z7901 Long term (current) use of anticoagulants: Secondary | ICD-10-CM | POA: Diagnosis not present

## 2021-05-06 MED ORDER — SUTAB 1479-225-188 MG PO TABS: 1.0000 | ORAL_TABLET | Freq: Once | ORAL | 0 refills | Status: AC

## 2021-05-06 NOTE — Progress Notes (Signed)
HISTORY OF PRESENT ILLNESS:  Joanna Hood is a 75 y.o. female with multiple significant medical problems including history of atrial fibrillation, congestive heart failure, mitral valve disease status post mitral valve replacement surgery Oct 28, 2020, chronic anticoagulation therapy, and a history of multiple adenomatous colon polyps.  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  See that dictation for details.  CT scan of the abdomen and pelvis revealed no acute abnormalities.  Follow-up CBC showed improvement in hemoglobin at 11.8.  She did undergo upper endoscopy January 23, 2021.  She was found to have a few superficial gastric erosions.  Otherwise normal.  Placed on omeprazole.  She continues on this medication.  No complaints today.  Feeling well.  Recovered nicely from her surgery.  She has gained a few pounds.  REVIEW OF SYSTEMS:  All non-GI ROS negative unless otherwise stated in the HPI except for heart murmur, shortness of breath  Past Medical History:  Diagnosis Date   Anemia    Atrial fibrillation ([place])    Atrial fibrillation with RVR ([place]) 2014   persistent   Chronic diastolic CHF (congestive heart failure) (HCC)    HTN   Chronic diastolic congestive heart failure ([place])    COVID-19 virus infection 01/20/2019   ESOPHAGEAL STRICTURE 02/19/2005   Qualifier: Diagnosis of  By: Amil Amen MD, Caleen Essex    Heart murmur    History of anemia    unclear cause, now resolved   History of chicken pox    History of colon polyps    benign   HLD (hyperlipidemia)    Hypothyroidism    Malignant hypertension longstanding   Microcytosis    Mitral valve regurgitation    Osteopenia 12/2013   T -2.3 hip   Osteoporosis    Pulmonary hypertension (HCC)    Rheumatoid arthritis ([place]) 11/2015   +RF, +CCP, ESR 49, synovitis on  exam and [place] 11/2015 Trudie Reed)   Right lower lobe pneumonia 07/24/2014   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   29 mm Medtronic Mosaic stented porcine bioprosthetic tissue valve   Tricuspid regurgitation    Vitamin D deficiency     Past Surgical History:  Procedure Laterality Date   CARDIAC CATHETERIZATION  2014   normal per patient   CLIPPING OF ATRIAL APPENDAGE N/A 10/28/2020   Procedure: CLIPPING OF ATRIAL APPENDAGE USING ATRICURE  CLIP SIZE 45MM;  Surgeon: Rexene Alberts, MD;  Location: [facility];  Service: Open Heart Surgery;  Laterality: N/A;   COLONOSCOPY  11/2015   TA, int hem, rpt 5 yrs Henrene Pastor)   LEFT HEART CATHETERIZATION WITH CORONARY ANGIOGRAM N/A 05/01/2013   Procedure: LEFT HEART CATHETERIZATION WITH CORONARY ANGIOGRAM;  Surgeon: Leonie Man, MD;  Location: [facility] CATH LAB;  Service: Cardiovascular;  Laterality: N/A;   MITRAL VALVE REPAIR N/A 10/28/2020   Procedure: MITRAL VALVE REPLACEMENT (MVR) USING MEDTRONIC MOSAIC VALVE SIZE 29MM;  Surgeon: Rexene Alberts, MD;  Location: [facility];  Service: Open Heart Surgery;  Laterality: N/A;   MULTIPLE EXTRACTIONS WITH ALVEOLOPLASTY N/A 10/24/2020   Procedure: MULTIPLE EXTRACTION WITH ALVEOLOPLASTY;  Surgeon: Charlaine Dalton, DMD;  Location: [facility];  Service: Dentistry;  Laterality: N/A;   MULTIPLE TOOTH EXTRACTIONS  10/2020   Extractions of teeth numbers 3, 4, 7, 8, 9, 10, 11 and 14 - prior to valvular  surgery   PARTIAL HYSTERECTOMY  1970   fibroids   RIGHT HEART CATH N/A 10/25/2020   Procedure: RIGHT HEART CATH;  Surgeon: Jolaine Artist, MD;  Location: [facility] CV LAB;  Service: Cardiovascular;  Laterality: N/A;   RIGHT/LEFT HEART CATH AND CORONARY ANGIOGRAPHY N/A 10/21/2020   Procedure: RIGHT/LEFT HEART CATH AND CORONARY ANGIOGRAPHY;  Surgeon: Lorretta Harp, MD;  Location: [facility] CV LAB;  Service: Cardiovascular;  Laterality: N/A;   TEE WITHOUT CARDIOVERSION N/A 10/21/2020   Procedure: TRANSESOPHAGEAL  ECHOCARDIOGRAM (TEE);  Surgeon: Lelon Perla, MD;  Location: [facility] ENDOSCOPY;  Service: Cardiovascular;  Laterality: N/A;   TEE WITHOUT CARDIOVERSION N/A 10/28/2020   Procedure: TRANSESOPHAGEAL ECHOCARDIOGRAM (TEE);  Surgeon: Rexene Alberts, MD;  Location: [facility];  Service: Open Heart Surgery;  Laterality: N/A;    Social History Joanna Hood  reports that she quit smoking about 52 years ago. Her smoking use included cigarettes. She has quit using smokeless tobacco. She reports that she does not drink alcohol and does not use drugs.  family history includes Cancer in her maternal grandmother and mother; Diabetes in her sister; Heart disease in her maternal grandmother; Hypertension in her cousin and sister; Sudden death in her mother.  Allergies  Allergen Reactions   Metronidazole Itching and Rash        PHYSICAL EXAMINATION: Vital signs: BP (!) 142/80   Pulse 64   Ht 5' 2.75" (1.594 m)   Wt 144 lb (65.3 kg)   SpO2 98%   BMI 25.71 kg/m   Constitutional: generally well-appearing, no acute distress Psychiatric: alert and oriented x3, cooperative Eyes: extraocular movements intact, anicteric, conjunctiva pink Mouth: oral pharynx moist, no lesions Neck: supple no lymphadenopathy Cardiovascular: Irregular with obvious systolic murmur Lungs: clear to auscultation bilaterally Abdomen: soft, nontender, nondistended, no obvious ascites, no peritoneal signs, normal bowel sounds, no organomegaly Rectal: Omitted Extremities: no clubbing or cyanosis.  Trace lower extremity edema bilaterally Skin: no lesions on visible extremities Neuro: No focal deficits.  Cranial nerves intact  ASSESSMENT:  1.  History of anemia.  On iron. 2.  Recent problems with weight loss.  Situational.  No ominous diagnoses.  Improving. 3.  History of multiple colon polyps.  Adenomatous and sessile serrated.  Due for surveillance.  Last examination June 2017.  She is interested in scheduling this  today.   PLAN:  1.  CBC today 2.  Schedule colonoscopy.  The patient is high risk given her comorbidities and the need to address anticoagulation therapy.The nature of the procedure, as well as the risks, benefits, and alternatives were carefully and thoroughly reviewed with the patient. Ample time for discussion and questions allowed. The patient understood, was satisfied, and agreed to proceed.  3.  Patient will remain on Coumadin throughout the procedure.  The risks and benefits of this strategy reviewed. 4.  Hold iron 1 week prior to the procedure

## 2021-05-06 NOTE — Patient Instructions (Signed)
If you are age 74 or older, your body mass index should be between 23-30. Your Body mass index is 25.71 kg/m. If this is out of the aforementioned range listed, please consider follow up with your Primary Care Provider.  If you are age 51 or younger, your body mass index should be between 19-25. Your Body mass index is 25.71 kg/m. If this is out of the aformentioned range listed, please consider follow up with your Primary Care Provider.   ________________________________________________________  The [facility] providers would like to encourage you to use [facility] to communicate with providers for non-urgent requests or questions.  Due to long hold times on the telephone, sending your provider a message by [facility] may be a faster and more efficient way to get a response.  Please allow 48 business hours for a response.  Please remember that this is for non-urgent requests.  _______________________________________________________  Dennis Bast have been scheduled for a colonoscopy. Please follow written instructions given to you at your visit today.  Please pick up your prep supplies at the pharmacy within the next 1-3 days. If you use inhalers (even only as needed), please bring them with you on the day of your procedure.

## 2021-05-13 ENCOUNTER — Ambulatory Visit: Payer: Medicare HMO | Admitting: *Deleted

## 2021-05-13 ENCOUNTER — Other Ambulatory Visit: Payer: Self-pay

## 2021-05-13 DIAGNOSIS — Z953 Presence of xenogenic heart valve: Secondary | ICD-10-CM | POA: Diagnosis not present

## 2021-05-13 DIAGNOSIS — Z5181 Encounter for therapeutic drug level monitoring: Secondary | ICD-10-CM

## 2021-05-13 DIAGNOSIS — I4819 Other persistent atrial fibrillation: Secondary | ICD-10-CM | POA: Diagnosis not present

## 2021-05-13 LAB — POCT INR: INR: 2.1 (ref 2.0–3.0)

## 2021-05-13 NOTE — Patient Instructions (Signed)
Description   Continue taking  warfarin 1 tablet daily, except 1/2 a tablet on Wednesdays.  Recheck 3 weeks.    [phone]

## 2021-05-22 HISTORY — PX: COLONOSCOPY: SHX174

## 2021-05-30 DIAGNOSIS — H52209 Unspecified astigmatism, unspecified eye: Secondary | ICD-10-CM | POA: Diagnosis not present

## 2021-05-30 DIAGNOSIS — H5203 Hypermetropia, bilateral: Secondary | ICD-10-CM | POA: Diagnosis not present

## 2021-05-30 DIAGNOSIS — H524 Presbyopia: Secondary | ICD-10-CM | POA: Diagnosis not present

## 2021-06-03 ENCOUNTER — Ambulatory Visit: Payer: Medicare HMO | Admitting: *Deleted

## 2021-06-03 ENCOUNTER — Other Ambulatory Visit: Payer: Self-pay

## 2021-06-03 DIAGNOSIS — Z953 Presence of xenogenic heart valve: Secondary | ICD-10-CM | POA: Diagnosis not present

## 2021-06-03 DIAGNOSIS — I4819 Other persistent atrial fibrillation: Secondary | ICD-10-CM

## 2021-06-03 LAB — POCT INR: INR: 2.3 (ref 2.0–3.0)

## 2021-06-03 NOTE — Patient Instructions (Signed)
Description   Continue taking  warfarin 1 tablet daily, except 1/2 a tablet on Wednesdays.  Recheck 4 weeks.  Coumadin Clinic [phone]

## 2021-06-05 NOTE — Progress Notes (Signed)
ADVANCED HF CLINIC NOTE  PCP: Dr Leo Grosser Primary Cardiologist: Dr Gwenlyn Found  HF MD: Dr [name]   HPI:  75 y.o. AAF, followed by Dr. Gwenlyn Found, w/ h/o chronic diastolic heart failure w/ predominant RV dysfunction + pulmonary hypertension, chronic atrial fibrillation on Eliquis, severe mitral regurgitation, systemic HTN and rheumatoid arthritis on Methotrexate s/p bioprosthetic MVR with clipping atrial appendage 5/22  Had Echo 8/21 showed normal LVEF, 60-65%, w/ mild LVH and G3DD c/w restrictive physiology. RV was mildly reduced at the time w/ severely elevated pulmonary artery pressure at 78 mmHg. LA size was severely dilated, RA mildly dilated. Also w/ findings c/w severe MR.  Developed increased shortness of breath and was set up for TEE/cath to further evaluate mitral valve. Admitted 10/21/2020 for valve work up. TEE with severe MR. LHC with normal cors and RHC with elevated PA pressures/PVR. Optimized with milrinone and on 10/28/20 underwent bioprosthetic MVR and clipping atrial  appendage. Milrinone gradually weaned off. Started on GDMT but no bb due to low CO-OX.  Echo 12/16/20 EF 50-55% MVR ok RV mildly down.   Today she returns for HF follow up with her daughter. Overall feeling fine. She has finished CR. She is not SOB walking on flat ground or with ADLs. Denies abnormal bleeding, CP, dizziness, edema, or PND/Orthopnea. Appetite ok. No fever or chills. Taking all medications. Has not needed extra Lasix. Has a colonoscopy on Friday. Detailed BP log from home shows BP 120-140s/70-80s and weight 141-143 lbs.   Cardiac Testing - Echo (6/22): EF 50-55%, MVR ok, RV mildly down  - R/LHC (5/22): normal cors  - S/P bioprosthetic MVR Clipping atrial appendage (5/22).   - Echo (5/22): EF 50% bioprosthetic MV (normal)  - TEE (5/22): LVEF normal. RV systolic function is now moderately reduced w/ mild-mod TR, in setting of severe MR.  - Echo (8/21): EF 60-65%, w/ mild LVH and G3DD c/w restrictive  physiology. RV was mildly reduced at the time  ROS: All systems negative except as listed in HPI, PMH and Problem List.  SH:  Social History   Socioeconomic History   Marital status: Single    Spouse name: Not on file   Number of children: 1   Years of education: Not on file   Highest education level: Not on file  Occupational History   Occupation: Retired  Tobacco Use   Smoking status: Former    Types: Cigarettes    Quit date: 05/16/1969    Years since quitting: 52.1   Smokeless tobacco: Former  Scientific laboratory technician Use: Never used  Substance and Sexual Activity   Alcohol use: No    Alcohol/week: 0.0 standard drinks   Drug use: No   Sexual activity: Not Currently  Other Topics Concern   Not on file  Social History Narrative   Lives with grand-daughter and godson   Occupation: retired, 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   Edu: [place] Strain: Low Risk    Difficulty of Paying Living Expenses: Not hard at all  Food Insecurity: No Food Insecurity   Worried About Charity fundraiser in the Last Year: Never true   Arboriculturist in the Last Year: Never true  Transportation Needs: No Transportation Needs   Lack of Transportation (Medical): No   Lack of Transportation (Non-Medical): No  Physical Activity: Inactive   Days of Exercise per Week: 0 days   Minutes  of Exercise per Session: 0 min  Stress: No Stress Concern Present   Feeling of Stress : Not at all  Social Connections: Not on file  Intimate Partner Violence: Not At Risk   Fear of Current or Ex-Partner: No   Emotionally Abused: No   Physically Abused: No   Sexually Abused: No    FH:  Family History  Problem Relation Age of Onset   Cancer Mother        ovarian   Sudden death Mother    Diabetes Sister    Hypertension Sister    Cancer Maternal Grandmother        lung   Heart disease Maternal Grandmother    Hypertension  Cousin    CAD Neg Hx    Stroke Neg Hx    Colon cancer Neg Hx    Esophageal cancer Neg Hx    Rectal cancer Neg Hx     Past Medical History:  Diagnosis Date   Anemia    Atrial fibrillation ([place])    Atrial fibrillation with RVR ([place]) 2014   persistent   Chronic diastolic CHF (congestive heart failure) (HCC)    HTN   Chronic diastolic congestive heart failure ([place])    COVID-19 virus infection 01/20/2019   ESOPHAGEAL STRICTURE 02/19/2005   Qualifier: Diagnosis of  By: Amil Amen MD, Caleen Essex    Heart murmur    History of anemia    unclear cause, now resolved   History of chicken pox    History of colon polyps    benign   HLD (hyperlipidemia)    Hypothyroidism    Malignant hypertension longstanding   Microcytosis    Mitral valve regurgitation    Osteopenia 12/2013   T -2.3 hip   Osteoporosis    Pulmonary hypertension (HCC)    Rheumatoid arthritis ([place]) 11/2015   +RF, +CCP, ESR 49, synovitis on exam and [place] 11/2015 Trudie Reed)   Right lower lobe pneumonia 07/24/2014   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   29 mm Medtronic Mosaic stented porcine bioprosthetic tissue valve   Tricuspid regurgitation    Vitamin D deficiency     Current Outpatient Medications  Medication Sig Dispense Refill   acetaminophen (TYLENOL) 325 MG tablet Take 650 mg by mouth every 6 (six) hours as needed (pain).     amLODipine (NORVASC) 10 MG tablet Take 1 tablet (10 mg total) by mouth daily. 30 tablet 3   amoxicillin (AMOXIL) 500 MG tablet Take 4 tablets (2,000 mg total) by mouth as needed (1 hour prior to dental work). 4 tablet 3   aspirin EC 81 MG tablet Take 81 mg by mouth daily. Swallow whole.     Calcium Carb-Cholecalciferol (CALCIUM-VITAMIN D) 600-400 MG-UNIT TABS Take 1 tablet by mouth daily.     cholecalciferol (VITAMIN D) 1000 UNITS tablet Take 1,000 Units by mouth daily.     ferrous sulfate 325 (65 FE) MG tablet Take 1 tablet (325 mg total) by mouth every  Monday, Wednesday, and Friday.     folic acid (FOLVITE) 1 MG tablet Take 1 tablet (1 mg total) by mouth daily. 90 tablet 3   furosemide (LASIX) 20 MG tablet Take 10 mg by mouth daily.     levothyroxine (SYNTHROID) 125 MCG tablet Take 1 tablet (125 mcg total) by mouth daily before breakfast. 90 tablet 3   methotrexate (RHEUMATREX) 2.5 MG tablet Take 15 mg by mouth every Friday. Caution:Chemotherapy. Protect from light.  omeprazole (PRILOSEC) 20 MG capsule Take 1 capsule (20 mg total) by mouth daily. 30 capsule 11   sacubitril-valsartan (ENTRESTO) 97-103 MG Take 1 tablet by mouth 2 (two) times daily. 60 tablet 6   spironolactone (ALDACTONE) 25 MG tablet Take 1 tablet (25 mg total) by mouth daily. 30 tablet 3   warfarin (COUMADIN) 4 MG tablet TAKE 1 TO 2 TABLETS EVERY DAY  OR AS DIRECTED  BY  THE  COUMADIN  CLINIC 120 tablet 1   Current Facility-Administered Medications  Medication Dose Route Frequency Provider Last Rate Last Admin   0.9 %  sodium chloride infusion  500 mL Intravenous Continuous Irene Shipper, MD       BP (!) 154/76    Pulse 60    Wt 65.2 kg (143 lb 12.8 oz)    SpO2 99%    BMI 25.68 kg/m   Wt Readings from Last 3 Encounters:  06/09/21 65.2 kg (143 lb 12.8 oz)  05/06/21 65.3 kg (144 lb)  03/31/21 64.2 kg (141 lb 8.6 oz)   PHYSICAL EXAM: General:  NAD. No resp difficulty HEENT: Normal Neck: Supple. No JVD. Carotids 2+ bilat; no bruits. No lymphadenopathy or thryomegaly appreciated. Cor: PMI nondisplaced. Irregular rate & rhythm. No rubs, gallops or murmurs. Lungs: Clear Abdomen: Soft, nontender, nondistended. No hepatosplenomegaly. No bruits or masses. Good bowel sounds. Extremities: No cyanosis, clubbing, rash, edema Neuro: Alert & oriented x 3, cranial nerves grossly intact. Moves all 4 extremities w/o difficulty. Affect pleasant.  ASSESSMENT & PLAN:  1. Severe Rheumatic Mitral Valve Regurgitation --> 10/28/20 S/P MVR - Primary MR. LVEF normal 60-65%  - LHC w/  normal coronaries 5/22 - S/P Bioprosthetic MVR 10/28/20 - Echo 12/16/20 EF 50-55% MVR ok RV mildly down  - INR followed at [facility]. INR 2.3. - Discussed SBE prophylaxis with procedures. - Doing great, finished CR.   2. Severe Pulmonary Hypertension  - TTE 8/21 w/ severely elevated RVSP, 78 mmHg. RV mildly reduced - TEE 10/21/20 w/ moderately reduced RV + mild-mod TR - RVSP on RHC severely elevated at 97 mmHg pre-MVR - Repeat RHC on 5/6 with improved hemodynamics but PA pressures and PVR still elevated.    3. Chronic Diastolic Heart Failure w/ Predominant RV Dysfunction  - Echo (8/21): showed normal LVEF, 60-65%, w/ mild LVH and G3DD c/w restrictive physiology. RV was mildly reduced at the time - TEE (10/21/20): EF normal. RV systolic function is now moderately reduced w/ mild-mod TR, in setting of progressive/ now severe MR. - Echo (6/22): EF 50-55% mild RV dysfunction. MVR ok  - NYHA II. Volume status looks good. - Continue Lasix 10 mg daily. - Continue spironolactone 25 mg daily.  - Continue Entresto 97-103 mg bid. - HR too low for b-blocker. - Discussed SGLT2i, want to think about it (90 day supply would be $9.85). - BMET today.   4. Chronic Afib:  - In setting of severe MR w/ severe LAE. - Rate controlled. On Coumadin. Followed by Coumadin Clinic  - CBC today.   5. Rheumatoid Arthritis - On MTX - Followed by Rheumatology    6. Hypertension  - Mildly elevated today. Improved after amlodipine added, BP 120-140s at home. - Continue current regimen.  Doing great! Follow up with Dr. [name] in 3 months.  [place] FNP 06/09/21 9:23 AM

## 2021-06-06 ENCOUNTER — Encounter: Payer: Self-pay | Admitting: Internal Medicine

## 2021-06-09 ENCOUNTER — Encounter: Payer: Medicare HMO | Admitting: Internal Medicine

## 2021-06-09 ENCOUNTER — Other Ambulatory Visit (HOSPITAL_COMMUNITY): Payer: Self-pay

## 2021-06-09 ENCOUNTER — Other Ambulatory Visit: Payer: Self-pay

## 2021-06-09 ENCOUNTER — Telehealth (HOSPITAL_COMMUNITY): Payer: Self-pay | Admitting: Surgery

## 2021-06-09 ENCOUNTER — Ambulatory Visit (HOSPITAL_COMMUNITY)
Admission: RE | Admit: 2021-06-09 | Discharge: 2021-06-09 | Disposition: A | Discharge status: Home / Self Care | Payer: Medicare HMO | Source: Ambulatory Visit | Attending: Internal Medicine | Admitting: Internal Medicine

## 2021-06-09 ENCOUNTER — Encounter (HOSPITAL_COMMUNITY): Payer: Self-pay

## 2021-06-09 VITALS — BP 154/76 | HR 60 | Wt 143.8 lb

## 2021-06-09 DIAGNOSIS — Z953 Presence of xenogenic heart valve: Secondary | ICD-10-CM | POA: Diagnosis not present

## 2021-06-09 DIAGNOSIS — I482 Chronic atrial fibrillation, unspecified: Secondary | ICD-10-CM | POA: Diagnosis not present

## 2021-06-09 DIAGNOSIS — Z7901 Long term (current) use of anticoagulants: Secondary | ICD-10-CM | POA: Insufficient documentation

## 2021-06-09 DIAGNOSIS — I110 Hypertensive heart disease with heart failure: Secondary | ICD-10-CM | POA: Diagnosis not present

## 2021-06-09 DIAGNOSIS — Z79899 Other long term (current) drug therapy: Secondary | ICD-10-CM | POA: Insufficient documentation

## 2021-06-09 DIAGNOSIS — I10 Essential (primary) hypertension: Secondary | ICD-10-CM | POA: Diagnosis not present

## 2021-06-09 DIAGNOSIS — I051 Rheumatic mitral insufficiency: Secondary | ICD-10-CM | POA: Insufficient documentation

## 2021-06-09 DIAGNOSIS — I4819 Other persistent atrial fibrillation: Secondary | ICD-10-CM | POA: Diagnosis not present

## 2021-06-09 DIAGNOSIS — I272 Pulmonary hypertension, unspecified: Secondary | ICD-10-CM | POA: Diagnosis not present

## 2021-06-09 DIAGNOSIS — I5032 Chronic diastolic (congestive) heart failure: Secondary | ICD-10-CM | POA: Diagnosis not present

## 2021-06-09 DIAGNOSIS — M069 Rheumatoid arthritis, unspecified: Secondary | ICD-10-CM

## 2021-06-09 LAB — CBC
HCT: 33.9 % — ABNORMAL LOW (ref 36.0–46.0)
Hemoglobin: 10.8 g/dL — ABNORMAL LOW (ref 12.0–15.0)
MCH: 24.8 pg — ABNORMAL LOW (ref 26.0–34.0)
MCHC: 31.9 g/dL (ref 30.0–36.0)
MCV: 77.8 fL — ABNORMAL LOW (ref 80.0–100.0)
Platelets: 164 10*3/uL (ref 150–400)
RBC: 4.36 MIL/uL (ref 3.87–5.11)
RDW: 14.2 % (ref 11.5–15.5)
WBC: 3.9 10*3/uL — ABNORMAL LOW (ref 4.0–10.5)
nRBC: 0 % (ref 0.0–0.2)

## 2021-06-09 LAB — BASIC METABOLIC PANEL
Anion gap: 6 (ref 5–15)
BUN: 21 mg/dL (ref 8–23)
CO2: 27 mmol/L (ref 22–32)
Calcium: 9.5 mg/dL (ref 8.9–10.3)
Chloride: 107 mmol/L (ref 98–111)
Creatinine, Ser: 1.01 mg/dL — ABNORMAL HIGH (ref 0.44–1.00)
GFR, Estimated: 58 mL/min — ABNORMAL LOW (ref >60)
Glucose, Bld: 98 mg/dL (ref 70–99)
Potassium: 4.2 mmol/L (ref 3.5–5.1)
Sodium: 140 mmol/L (ref 135–145)

## 2021-06-09 NOTE — Telephone Encounter (Signed)
I called patient regarding labs and need for follow-up.  I left a message for a return call.

## 2021-06-09 NOTE — Progress Notes (Signed)
Opened in error

## 2021-06-09 NOTE — Patient Instructions (Signed)
It was great to see you today! No medication changes are needed at this time.  Labs today We will only contact you if something comes back abnormal or we need to make some changes. Otherwise no news is good news!  Your physician recommends that you schedule a follow-up appointment in: 3-4 months with Dr [name]   Do the following things EVERYDAY: Weigh yourself in the morning before breakfast. Write it down and keep it in a log. Take your medicines as prescribed Eat low salt foods--Limit salt (sodium) to 2000 mg per day.  Stay as active as you can everyday Limit all fluids for the day to less than 2 liters  At the [facility], you and your health needs are our priority. As part of our continuing mission to provide you with exceptional heart care, we have created designated Provider Care Teams. These Care Teams include your primary Cardiologist (physician) and Advanced Practice Providers (APPs- Physician Assistants and Nurse Practitioners) who all work together to provide you with the care you need, when you need it.   You may see any of the following providers on your designated Care Team at your next follow up: Dr Glori Bickers Dr Haynes Kerns, NP Lyda Jester, [place] [facility] [place], [place] Audry Riles, PharmD   Please be sure to bring in all your medications bottles to every appointment.    If you have any questions or concerns before your next appointment please send [place] a message through [place] or call our office at [phone].    TO LEAVE A MESSAGE FOR THE NURSE SELECT OPTION 2, PLEASE LEAVE A MESSAGE INCLUDING: YOUR NAME DATE OF BIRTH CALL BACK NUMBER REASON FOR CALL**this is important as we prioritize the call backs  YOU WILL RECEIVE A CALL BACK THE SAME DAY AS LONG AS YOU CALL BEFORE 4:00 PM

## 2021-06-09 NOTE — Telephone Encounter (Signed)
-----   Message from Rafael Bihari, [place] sent at 06/09/2021 11:46 AM EST ----- Labs stable, Hgb mildly lower than previous 4 months ago, 11.8-->10.8. Watch for signs of bleeding as she is on Coumadin.   PCP and GI can follow up. She has colonoscopy scheduled this week.

## 2021-06-13 ENCOUNTER — Ambulatory Visit (AMBULATORY_SURGERY_CENTER): Payer: Medicare HMO | Admitting: Internal Medicine

## 2021-06-13 ENCOUNTER — Encounter: Payer: Self-pay | Admitting: Internal Medicine

## 2021-06-13 VITALS — BP 133/52 | HR 61 | Temp 97.6°F | Resp 10 | Ht 62.0 in | Wt 144.0 lb

## 2021-06-13 DIAGNOSIS — K219 Gastro-esophageal reflux disease without esophagitis: Secondary | ICD-10-CM | POA: Diagnosis not present

## 2021-06-13 DIAGNOSIS — I4891 Unspecified atrial fibrillation: Secondary | ICD-10-CM | POA: Diagnosis not present

## 2021-06-13 DIAGNOSIS — M069 Rheumatoid arthritis, unspecified: Secondary | ICD-10-CM | POA: Diagnosis not present

## 2021-06-13 DIAGNOSIS — I10 Essential (primary) hypertension: Secondary | ICD-10-CM | POA: Diagnosis not present

## 2021-06-13 DIAGNOSIS — Z86010 Personal history of colonic polyps: Secondary | ICD-10-CM

## 2021-06-13 MED ORDER — SODIUM CHLORIDE 0.9 % IV SOLN: 500.0000 mL | Freq: Once | INTRAVENOUS | Status: DC

## 2021-06-13 NOTE — Progress Notes (Signed)
HISTORY OF PRESENT ILLNESS:   Joanna Hood is a 75 y.o. female with multiple significant medical problems including history of atrial fibrillation, congestive heart failure, mitral valve disease status post mitral valve replacement surgery Oct 28, 2020, chronic anticoagulation therapy, and a history of multiple adenomatous colon polyps.  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  See that dictation for details.  CT scan of the abdomen and pelvis revealed no acute abnormalities.  Follow-up CBC showed improvement in hemoglobin at 11.8.  She did undergo upper endoscopy January 23, 2021.  She was found to have a few superficial gastric erosions.  Otherwise normal.  Placed on omeprazole.  She continues on this medication.  No complaints today.  Feeling well.  Recovered nicely from her surgery.  She has gained a few pounds.   REVIEW OF SYSTEMS:   All non-GI ROS negative unless otherwise stated in the HPI except for heart murmur, shortness of breath       Past Medical History:  Diagnosis Date   Anemia     Atrial fibrillation ([place])     Atrial fibrillation with RVR ([place]) 2014    persistent   Chronic diastolic CHF (congestive heart failure) (HCC)      HTN   Chronic diastolic congestive heart failure ([place])     COVID-19 virus infection 01/20/2019   ESOPHAGEAL STRICTURE 02/19/2005    Qualifier: Diagnosis of  By: Amil Amen MD, Caleen Essex     Heart murmur     History of anemia      unclear cause, now resolved   History of chicken pox     History of colon polyps      benign   HLD (hyperlipidemia)     Hypothyroidism     Malignant hypertension longstanding   Microcytosis     Mitral valve regurgitation     Osteopenia 12/2013    T -2.3 hip   Osteoporosis     Pulmonary hypertension (HCC)     Rheumatoid arthritis ([place]) 11/2015     +RF, +CCP, ESR 49, synovitis on exam and [place] 11/2015 Trudie Reed)   Right lower lobe pneumonia 07/24/2014   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    29 mm Medtronic Mosaic stented porcine bioprosthetic tissue valve   Tricuspid regurgitation     Vitamin D deficiency             Past Surgical History:  Procedure Laterality Date   CARDIAC CATHETERIZATION   2014    normal per patient   CLIPPING OF ATRIAL APPENDAGE N/A 10/28/2020    Procedure: CLIPPING OF ATRIAL APPENDAGE USING ATRICURE  CLIP SIZE 45MM;  Surgeon: Rexene Alberts, MD;  Location: [facility];  Service: Open Heart Surgery;  Laterality: N/A;   COLONOSCOPY   11/2015    TA, int hem, rpt 5 yrs Henrene Pastor)   LEFT HEART CATHETERIZATION WITH CORONARY ANGIOGRAM N/A 05/01/2013    Procedure: LEFT HEART CATHETERIZATION WITH CORONARY ANGIOGRAM;  Surgeon: Leonie Man, MD;  Location: [facility] CATH LAB;  Service: Cardiovascular;  Laterality: N/A;   MITRAL VALVE REPAIR N/A 10/28/2020    Procedure: MITRAL VALVE REPLACEMENT (MVR) USING MEDTRONIC MOSAIC VALVE SIZE 29MM;  Surgeon: Rexene Alberts, MD;  Location: [facility];  Service: Open Heart Surgery;  Laterality: N/A;   MULTIPLE EXTRACTIONS WITH ALVEOLOPLASTY N/A 10/24/2020  Procedure: MULTIPLE EXTRACTION WITH ALVEOLOPLASTY;  Surgeon: Charlaine Dalton, DMD;  Location: [place];  Service: Dentistry;  Laterality: N/A;   MULTIPLE TOOTH EXTRACTIONS   10/2020    Extractions of teeth numbers 3, 4, 7, 8, 9, 10, 11 and 14 - prior to valvular surgery   PARTIAL HYSTERECTOMY   1970    fibroids   RIGHT HEART CATH N/A 10/25/2020    Procedure: RIGHT HEART CATH;  Surgeon: Jolaine Artist, MD;  Location: [place] CV LAB;  Service: Cardiovascular;  Laterality: N/A;   RIGHT/LEFT HEART CATH AND CORONARY ANGIOGRAPHY N/A 10/21/2020    Procedure: RIGHT/LEFT HEART CATH AND CORONARY ANGIOGRAPHY;  Surgeon: Lorretta Harp, MD;  Location: [facility] CV LAB;  Service: Cardiovascular;  Laterality: N/A;   TEE WITHOUT  CARDIOVERSION N/A 10/21/2020    Procedure: TRANSESOPHAGEAL ECHOCARDIOGRAM (TEE);  Surgeon: Lelon Perla, MD;  Location: [facility] ENDOSCOPY;  Service: Cardiovascular;  Laterality: N/A;   TEE WITHOUT CARDIOVERSION N/A 10/28/2020    Procedure: TRANSESOPHAGEAL ECHOCARDIOGRAM (TEE);  Surgeon: Rexene Alberts, MD;  Location: [place];  Service: Open Heart Surgery;  Laterality: N/A;      Social History Joanna Hood  reports that she quit smoking about 52 years ago. Her smoking use included cigarettes. She has quit using smokeless tobacco. She reports that she does not drink alcohol and does not use drugs.   family history includes Cancer in her maternal grandmother and mother; Diabetes in her sister; Heart disease in her maternal grandmother; Hypertension in her cousin and sister; Sudden death in her mother.       Allergies  Allergen Reactions   Metronidazole Itching and Rash            PHYSICAL EXAMINATION: Vital signs: BP (!) 142/80    Pulse 64    Ht 5' 2.75" (1.594 m)    Wt 144 lb (65.3 kg)    SpO2 98%    BMI 25.71 kg/m   Constitutional: generally well-appearing, no acute distress Psychiatric: alert and oriented x3, cooperative Eyes: extraocular movements intact, anicteric, conjunctiva pink Mouth: oral pharynx moist, no lesions Neck: supple no lymphadenopathy Cardiovascular: Irregular with obvious systolic murmur Lungs: clear to auscultation bilaterally Abdomen: soft, nontender, nondistended, no obvious ascites, no peritoneal signs, normal bowel sounds, no organomegaly Rectal: Omitted Extremities: no clubbing or cyanosis.  Trace lower extremity edema bilaterally Skin: no lesions on visible extremities Neuro: No focal deficits.  Cranial nerves intact   ASSESSMENT:   1.  History of anemia.  On iron. 2.  Recent problems with weight loss.  Situational.  No ominous diagnoses.  Improving. 3.  History of multiple colon polyps.  Adenomatous and sessile serrated.  Due for surveillance.   Last examination June 2017.  She is interested in scheduling this today.     PLAN:   1.  CBC today 2.  Schedule colonoscopy.  The patient is high risk given her comorbidities and the need to address anticoagulation therapy.The nature of the procedure, as well as the risks, benefits, and alternatives were carefully and thoroughly reviewed with the patient. Ample time for discussion and questions allowed. The patient understood, was satisfied, and agreed to proceed.  3.  Patient will remain on Coumadin throughout the procedure.  The risks and benefits of this strategy reviewed. 4.  Hold iron 1 week prior to the procedure  The patient was evaluated by myself in the office May 06, 2021 regarding anemia and the need for surveillance colonoscopy.  See the  complete H&P as outlined above.  No interval change in the history or physical examination.

## 2021-06-13 NOTE — Patient Instructions (Signed)
Impression/Recommendations:  Resume previous diet. Continue present medications.  Resume Coumadin (warfarin) today at prior dose.  YOU HAD AN ENDOSCOPIC PROCEDURE TODAY AT [facility]:   Refer to the procedure report that was given to you for any specific questions about what was found during the examination.  If the procedure report does not answer your questions, please call your gastroenterologist to clarify.  If you requested that your care partner not be given the details of your procedure findings, then the procedure report has been included in a sealed envelope for you to review at your convenience later.  YOU SHOULD EXPECT: Some feelings of bloating in the abdomen. Passage of more gas than usual.  Walking can help get rid of the air that was put into your GI tract during the procedure and reduce the bloating. If you had a lower endoscopy (such as a colonoscopy or flexible sigmoidoscopy) you may notice spotting of blood in your stool or on the toilet paper. If you underwent a bowel prep for your procedure, you may not have a normal bowel movement for a few days.  Please Note:  You might notice some irritation and congestion in your nose or some drainage.  This is from the oxygen used during your procedure.  There is no need for concern and it should clear up in a day or so.  SYMPTOMS TO REPORT IMMEDIATELY:  Following lower endoscopy (colonoscopy or flexible sigmoidoscopy):  Excessive amounts of blood in the stool  Significant tenderness or worsening of abdominal pains  Swelling of the abdomen that is new, acute  Fever of 100F or higher For urgent or emergent issues, a gastroenterologist can be reached at any hour by calling [phone]. Do not use MyChart messaging for urgent concerns.    DIET:  We do recommend a small meal at first, but then you may proceed to your regular diet.  Drink plenty of fluids but you should avoid alcoholic beverages for 24  hours.  ACTIVITY:  You should plan to take it easy for the rest of today and you should NOT DRIVE or use heavy machinery until tomorrow (because of the sedation medicines used during the test).    FOLLOW UP: Our staff will call the number listed on your records 48-72 hours following your procedure to check on you and address any questions or concerns that you may have regarding the information given to you following your procedure. If we do not reach you, we will leave a message.  We will attempt to reach you two times.  During this call, we will ask if you have developed any symptoms of COVID 19. If you develop any symptoms (ie: fever, flu-like symptoms, shortness of breath, cough etc.) before then, please call [phone].  If you test positive for Covid 19 in the 2 weeks post procedure, please call and report this information to [place].    If any biopsies were taken you will be contacted by phone or by letter within the next 1-3 weeks.  Please call us at [phone] if you have not heard about the biopsies in 3 weeks.    SIGNATURES/CONFIDENTIALITY: You and/or your care partner have signed paperwork which will be entered into your electronic medical record.  These signatures attest to the fact that that the information above on your After Visit Summary has been reviewed and is understood.  Full responsibility of the confidentiality of this discharge information lies with you and/or your care-partner.

## 2021-06-13 NOTE — Op Note (Signed)
[facility] Patient Name: Joanna Hood Procedure Date: 06/13/2021 2:31 PM MRN: [id number] Endoscopist: Docia Chuck. Henrene Pastor , MD Age: 75 Referring MD:  Date of Birth: 08-24-1945 Gender: Female Account #: [id number] Procedure:                Colonoscopy Indications:              High risk colon cancer surveillance: Personal                            history of non-advanced adenomas, High risk colon                            cancer surveillance: Personal history of sessile                            serrated colon polyp (less than 10 mm in size) with                            no dysplasia. Previous examinations 2006, 2011, 2017 Medicines:                Monitored Anesthesia Care Procedure:                Pre-Anesthesia Assessment:                           - Prior to the procedure, a History and Physical                            was performed, and patient medications and                            allergies were reviewed. The patient's tolerance of                            previous anesthesia was also reviewed. The risks                            and benefits of the procedure and the sedation                            options and risks were discussed with the patient.                            All questions were answered, and informed consent                            was obtained. Prior Anticoagulants: The patient has                            taken Coumadin (warfarin), last dose was day of                            procedure. ASA Grade Assessment: III - A patient  with severe systemic disease. After reviewing the                            risks and benefits, the patient was deemed in                            satisfactory condition to undergo the procedure.                           After obtaining informed consent, the colonoscope                            was passed under direct vision. Throughout the                             procedure, the patient's blood pressure, pulse, and                            oxygen saturations were monitored continuously. The                            Colonoscope was introduced through the anus and                            advanced to the the cecum, identified by                            appendiceal orifice and ileocecal valve. The                            ileocecal valve, appendiceal orifice, and rectum                            were photographed. The quality of the bowel                            preparation was excellent. The colonoscopy was                            performed without difficulty. The patient tolerated                            the procedure well. The bowel preparation used was                            SUPREP via split dose instruction. Scope In: 2:52:07 PM Scope Out: 3:05:22 PM Scope Withdrawal Time: 0 hours 9 minutes 33 seconds  Total Procedure Duration: 0 hours 13 minutes 15 seconds  Findings:                 The entire examined colon appeared normal on direct                            and retroflexion views. Complications:  No immediate complications. Estimated blood loss:                            None. Estimated Blood Loss:     Estimated blood loss: none. Impression:               - The entire examined colon is normal on direct and                            retroflexion views.                           - No specimens collected. Recommendation:           - Repeat colonoscopy is not recommended for                            surveillance.                           - Resume Coumadin (warfarin) today at prior dose.                           - Patient has a contact number available for                            emergencies. The signs and symptoms of potential                            delayed complications were discussed with the                            patient. Return to normal activities tomorrow.                             Written discharge instructions were provided to the                            patient.                           - Resume previous diet.                           - Continue present medications. Docia Chuck. Henrene Pastor, MD 06/13/2021 3:11:35 PM This report has been signed electronically.

## 2021-06-13 NOTE — Progress Notes (Signed)
Check-in-am  V/s-cw

## 2021-06-13 NOTE — Progress Notes (Signed)
Report to PACU, RN, vss, BBS= Clear.  

## 2021-06-16 ENCOUNTER — Encounter: Payer: Self-pay | Admitting: Family Medicine

## 2021-06-18 ENCOUNTER — Telehealth: Payer: Self-pay

## 2021-06-18 NOTE — Telephone Encounter (Signed)
°  Follow up Call-  Call back number 06/13/2021 01/23/2021  Post procedure Call Back phone  # [phone]  Permission to leave phone message Yes Yes  Some recent data might be hidden     Patient questions:  Do you have a fever, pain , or abdominal swelling? No. Pain Score  0 *  Have you tolerated food without any problems? Yes.    Have you been able to return to your normal activities? Yes.    Do you have any questions about your discharge instructions: Diet   No. Medications  No. Follow up visit  No.  Do you have questions or concerns about your Care? No.  Actions: * If pain score is 4 or above: No action needed, pain <4.

## 2021-06-18 NOTE — Telephone Encounter (Signed)
Left message on follow up call. 

## 2021-06-26 DIAGNOSIS — M0579 Rheumatoid arthritis with rheumatoid factor of multiple sites without organ or systems involvement: Secondary | ICD-10-CM | POA: Diagnosis not present

## 2021-06-26 DIAGNOSIS — Z79899 Other long term (current) drug therapy: Secondary | ICD-10-CM | POA: Diagnosis not present

## 2021-06-30 ENCOUNTER — Telehealth: Payer: Self-pay

## 2021-06-30 NOTE — Chronic Care Management (AMB) (Addendum)
Chronic Care Management Pharmacy Assistant   Name: Joanna Hood  MRN: [id number] DOB: 1946/06/02  Joanna Hood is an 76 y.o. year old female who presents for his initial CCM visit with the clinical pharmacist.  Reason for Encounter: Initial Questions   Conditions to be addressed/monitored: HTN, AFIB, CHF, Hypothyroidism   Recent office visits:  03/24/21-PCP-Patient presented for follow up hypertension.Flu shot given,stable on current medications no changes.   Recent consult visits:  06/13/21-[facility]-Patient presented for colonoscopy 06/03/21-[facility] 05/06/21-Gastroenterology-Patient presented for  follow up anemia. Labs ordered,schedule colonoscopy.Hold iron for 1 week, continue anticoagulation therapy. 04/04/21-Cardiac Rehab- multiple visits for therapy with exercise. 03/21/21-Cardiology-Patient presented for follow up heart failure.Continue with cardiac rehab,no medication changes, echocardiogram to be scheduled.  01/23/21-[facility]-Patient presented for procedure 01/22/21-Gastroenterology-Patient presented for weight loss.Labs reviewed,schedule diagnostic upper endoscopy. No medication changes.  Hospital visits:  None in previous 6 months  Medications: Outpatient Encounter Medications as of 06/30/2021  Medication Sig   acetaminophen (TYLENOL) 325 MG tablet Take 650 mg by mouth every 6 (six) hours as needed (pain).   amLODipine (NORVASC) 10 MG tablet Take 1 tablet (10 mg total) by mouth daily.   amoxicillin (AMOXIL) 500 MG tablet Take 4 tablets (2,000 mg total) by mouth as needed (1 hour prior to dental work). (Patient not taking: Reported on 06/13/2021)   aspirin EC 81 MG tablet Take 81 mg by mouth daily. Swallow whole.   Calcium Carb-Cholecalciferol (CALCIUM-VITAMIN D) 600-400 MG-UNIT TABS Take 1 tablet by mouth daily.   cholecalciferol (VITAMIN D) 1000 UNITS tablet Take 1,000 Units by mouth daily.   ferrous sulfate 325 (65  FE) MG tablet Take 1 tablet (325 mg total) by mouth every Monday, Wednesday, and Friday.   folic acid (FOLVITE) 1 MG tablet Take 1 tablet (1 mg total) by mouth daily.   furosemide (LASIX) 20 MG tablet Take 10 mg by mouth daily.   levothyroxine (SYNTHROID) 125 MCG tablet Take 1 tablet (125 mcg total) by mouth daily before breakfast.   methotrexate (RHEUMATREX) 2.5 MG tablet Take 15 mg by mouth every Friday. Caution:Chemotherapy. Protect from light.   omeprazole (PRILOSEC) 20 MG capsule Take 1 capsule (20 mg total) by mouth daily.   sacubitril-valsartan (ENTRESTO) 97-103 MG Take 1 tablet by mouth 2 (two) times daily.   spironolactone (ALDACTONE) 25 MG tablet Take 1 tablet (25 mg total) by mouth daily.   warfarin (COUMADIN) 4 MG tablet TAKE 1 TO 2 TABLETS EVERY DAY  OR AS DIRECTED  BY  THE  COUMADIN  CLINIC   Facility-Administered Encounter Medications as of 06/30/2021  Medication   0.9 %  sodium chloride infusion    Lab Results  Component Value Date/Time   HGBA1C 5.0 10/28/2020 01:27 AM   HGBA1C 5.5 11/30/2006 12:00 AM   MICROALBUR 37.3 (H) 09/10/2020 08:25 AM   MICROALBUR 15.2 (H) 06/02/2019 08:04 AM     BP Readings from Last 3 Encounters:  06/13/21 (!) 133/52  06/09/21 (!) 154/76  05/06/21 (!) 142/80    Patient contacted to review initial questions prior to visit with Debbora Dus.  Have you seen any other providers since your last visit with PCP? Yes  Gastroenterology, Cardiac Rehab.  Any changes in your medications or health? No  Any side effects from any medications? No  Do you have an symptoms or problems not managed by your medications? No  Any concerns about your health right now? No  Has your provider asked that you check blood  pressure, blood sugar, or follow special diet at home? Yes The patient will take her BP at home   Do you get any type of exercise on a regular basis? The patient finished cardiac rehab and is doing HEP at her home and walks when weather  permits.  Can you think of a goal you would like to reach for your health? No  Do you have any problems getting your medications? No The patient will use mail order and also local [facility] for short dose medications   Is there anything that you would like to discuss during the appointment? No   Spoke with patient and reminded them to have all medications, supplements and any blood glucose and blood pressure readings available for review with pharmacist, at their telephone visit on 07/07/21 at 11:00am.   Star Rating Drugs:  Medication:  Last Fill: Day Supply Entresto 97-103 mg 04/29/21 90    Care Gaps: Annual wellness visit in last year? Yes Most Recent BP reading:142/80  64-P 05/06/21  Debbora Dus, CPP notified  Avel Sensor, [place] Assistant [phone]  I have reviewed the care management and care coordination activities outlined in this encounter and I am certifying that I agree with the content of this note. No further action required.  Debbora Dus, PharmD Clinical Pharmacist [facility] [phone]

## 2021-07-01 ENCOUNTER — Other Ambulatory Visit: Payer: Self-pay

## 2021-07-01 ENCOUNTER — Ambulatory Visit: Payer: Medicare HMO | Admitting: *Deleted

## 2021-07-01 DIAGNOSIS — Z5181 Encounter for therapeutic drug level monitoring: Secondary | ICD-10-CM | POA: Diagnosis not present

## 2021-07-01 DIAGNOSIS — I4819 Other persistent atrial fibrillation: Secondary | ICD-10-CM | POA: Diagnosis not present

## 2021-07-01 DIAGNOSIS — Z953 Presence of xenogenic heart valve: Secondary | ICD-10-CM | POA: Diagnosis not present

## 2021-07-01 LAB — POCT INR: INR: 2.4 (ref 2.0–3.0)

## 2021-07-01 NOTE — Patient Instructions (Signed)
Description   Continue taking  warfarin 1 tablet daily, except 1/2 a tablet on Wednesdays.  Recheck 5 weeks.  [facility] [phone]

## 2021-07-04 ENCOUNTER — Other Ambulatory Visit: Payer: Self-pay | Admitting: Family Medicine

## 2021-07-04 NOTE — Telephone Encounter (Addendum)
See drug warning with Joanna Hood Last refill 03/12/21 #30/3 Last office visit  03/24/21 Upcoming 09/24/21

## 2021-07-04 NOTE — Telephone Encounter (Signed)
Pt needs a refill on spironolactone (ALDACTONE) 25 MG tablet sent to [facility]

## 2021-07-04 NOTE — Progress Notes (Signed)
Chronic Care Management Pharmacy Note  07/07/2021 Name:  Liyanna Cartwright MRN:  [id number] DOB:  1946-05-14  Summary: Initial CCM visit. Reviewed chronic health conditions - HTN, controlled (home readings 120-140/60-70). Osteopenia, on vitamin D and calcium supplements, no falls. Hypothyroidism, stable on levothyroxine 125 mcg. CHF and AFIB, stable, managed by cardiology. RA, managed by rheumatology. Anemia, managed by GI. Patient is doing well with current medications.  No barriers to adherence identified.   Recommendations/Changes made from today's visit: None   Plan: CCM follow up 12 months  CCM health concierge HTN review in 6 months   Subjective: Trine Windie Marasco is an 76 y.o. year old female who is a primary patient of Ria Bush, MD.  The CCM team was consulted for assistance with disease management and care coordination needs.    Engaged with patient by telephone for initial visit in response to provider referral for pharmacy case management and/or care coordination services.   Consent to Services:  The patient was given the following information about Chronic Care Management services today, agreed to services, and gave verbal consent: 1. CCM service includes personalized support from designated clinical staff supervised by the primary care provider, including individualized plan of care and coordination with other care providers 2. 24/7 contact phone numbers for assistance for urgent and routine care needs. 3. Service will only be billed when office clinical staff spend 20 minutes or more in a month to coordinate care. 4. Only one practitioner may furnish and bill the service in a calendar month. 5.The patient may stop CCM services at any time (effective at the end of the month) by phone call to the office staff. 6. The patient will be responsible for cost sharing (co-pay) of up to 20% of the service fee (after annual deductible is met). Patient agreed to services and  consent obtained.  Patient Care Team: Ria Bush, MD as PCP - General (Family Medicine) Lorretta Harp, MD as Consulting Physician (Cardiology) Gavin Pound, MD as Consulting Physician (Rheumatology) Shirl Harris, [place] as Consulting Physician (Optometry) Debbora Dus, [facility] as Pharmacist (Pharmacist)  Recent office visits: 03/24/21 - Ria Bush, PCP - Patient presented for follow up hypertension. Flu shot given. Stable on current medications. No changes.    Recent consult visits:  06/13/21 - Scarlette Shorts, MD, Gastroenterology - Patient presented for colonoscopy. 06/09/21 - Allena Katz, FNP, Cardiology - Patient presented for CHF follow up and other chronic conditions. Discussed SGLT2i, want to think about it (90 day supply would be $9.85) - BMET today. Doing great! Follow up 3 months. 05/06/21 - Scarlette Shorts, MD,Gastroenterology - Patient presented for follow up anemia. Labs ordered, schedule colonoscopy. Hold iron for 1 week, continue anticoagulation therapy. 03/21/21 - Cardiology - Patient presented for follow up heart failure. Continue with cardiac rehab, no medication changes, echocardiogram to be scheduled.  01/23/21 - [place] - Patient presented for procedure 01/22/21 - Gastroenterology - Patient presented for weight loss. Labs reviewed, schedule diagnostic upper endoscopy. No medication changes.   Hospital visits:  None in previous 6 months   Objective:  Lab Results  Component Value Date   CREATININE 1.01 (H) 06/09/2021   BUN 21 06/09/2021   GFR 60.14 01/22/2021   GFRNONAA 58 (L) 06/09/2021   GFRAA 79 (L) 05/03/2013   NA 140 06/09/2021   K 4.2 06/09/2021   CALCIUM 9.5 06/09/2021   CO2 27 06/09/2021   GLUCOSE 98 06/09/2021    Lab Results  Component Value Date/Time  HGBA1C 5.0 10/28/2020 01:27 AM   HGBA1C 5.5 11/30/2006 12:00 AM   GFR 60.14 01/22/2021 11:44 AM   GFR 76.51 11/12/2020 11:54 AM   MICROALBUR 37.3 (H) 09/10/2020 08:25 AM    MICROALBUR 15.2 (H) 06/02/2019 08:04 AM    Lab Results  Component Value Date   CHOL 144 09/10/2020   HDL 32.60 (L) 09/10/2020   LDLCALC 96 09/10/2020   TRIG 81.0 09/10/2020   CHOLHDL 4 09/10/2020    Hepatic Function Latest Ref Rng & Units 11/12/2020 10/31/2020 10/27/2020  Total Protein 6.5 - 8.1 g/dL - 5.0(L) 5.7(L)  Albumin 3.5 - 5.2 g/dL 3.5 2.6(L) 2.8(L)  AST 15 - 41 U/L - 30 29  ALT 0 - 44 U/L - 9 25  Alk Phosphatase 38 - 126 U/L - 56 65  Total Bilirubin 0.3 - 1.2 mg/dL - 1.0 0.7    Lab Results  Component Value Date/Time   TSH 0.64 09/10/2020 08:25 AM   TSH 5.03 (H) 06/02/2019 07:55 AM   FREET4 1.73 (H) 09/10/2020 08:25 AM   FREET4 1.29 10/27/2018 09:22 AM    CBC Latest Ref Rng & Units 06/09/2021 01/22/2021 11/12/2020  WBC 4.0 - 10.5 K/uL 3.9(L) 4.9 6.6  Hemoglobin 12.0 - 15.0 g/dL 10.8(L) 11.8(L) 9.7(L)  Hematocrit 36.0 - 46.0 % 33.9(L) 37.3 29.8(L)  Platelets 150 - 400 K/uL 164 175.0 290.0    Lab Results  Component Value Date/Time   VD25OH 46.38 09/10/2020 08:25 AM   VD25OH 30.25 06/02/2019 07:55 AM    Clinical ASCVD: No  The 10-year ASCVD risk score (Arnett DK, et al., 2019) is: 9.8%   Values used to calculate the score:     Age: 76 years     Sex: Female     Is Non-Hispanic African American: Yes     Diabetic: No     Tobacco smoker: No     Systolic Blood Pressure: 767 mmHg     Is BP treated: Yes     HDL Cholesterol: 32.6 mg/dL     Total Cholesterol: 144 mg/dL    Depression screen [facility] 2/9 03/28/2021 01/28/2021 09/04/2020  Decreased Interest 0 0 0  Down, Depressed, Hopeless 0 0 0  PHQ - 2 Score 0 0 0  Altered sleeping - - 0  Tired, decreased energy - - 0  Change in appetite - - 0  Feeling bad or failure about yourself  - - 0  Trouble concentrating - - 0  Moving slowly or fidgety/restless - - 0  Suicidal thoughts - - 0  PHQ-9 Score - - 0  Difficult doing work/chores - - Not difficult at all  Some recent data might be hidden    Social History   Tobacco  Use  Smoking Status Former   Types: Cigarettes   Quit date: 05/16/1969   Years since quitting: 52.1  Smokeless Tobacco Former   BP Readings from Last 3 Encounters:  06/13/21 (!) 133/52  06/09/21 (!) 154/76  05/06/21 (!) 142/80   Pulse Readings from Last 3 Encounters:  06/13/21 61  06/09/21 60  05/06/21 64   Wt Readings from Last 3 Encounters:  06/13/21 144 lb (65.3 kg)  06/09/21 143 lb 12.8 oz (65.2 kg)  05/06/21 144 lb (65.3 kg)   BMI Readings from Last 3 Encounters:  06/13/21 26.34 kg/m  06/09/21 25.68 kg/m  05/06/21 25.71 kg/m    Assessment/Interventions: Review of patient past medical history, allergies, medications, health status, including review of consultants reports, laboratory and other test data,  was performed as part of comprehensive evaluation and provision of chronic care management services.   SDOH:  (Social Determinants of Health) assessments and interventions performed: Yes SDOH Interventions    Flowsheet Row Most Recent Value  SDOH Interventions   Financial Strain Interventions Intervention Not Indicated      SDOH Screenings   Alcohol Screen: Low Risk    Last Alcohol Screening Score (AUDIT): 0  Depression (PHQ2-9): Low Risk    PHQ-2 Score: 0  Financial Resource Strain: Low Risk    Difficulty of Paying Living Expenses: Not very hard  Food Insecurity: No Food Insecurity   Worried About Charity fundraiser in the Last Year: Never true   Ran Out of Food in the Last Year: Never true  Housing: Low Risk    Last Housing Risk Score: 0  Physical Activity: Inactive   Days of Exercise per Week: 0 days   Minutes of Exercise per Session: 0 min  Social Connections: Not on file  Stress: No Stress Concern Present   Feeling of Stress : Not at all  Tobacco Use: Medium Risk   Smoking Tobacco Use: Former   Smokeless Tobacco Use: Former   Passive Exposure: Not on Pensions consultant Needs: No Transportation Needs   Lack of Transportation (Medical): No    Lack of Transportation (Non-Medical): No    CCM Care Plan  Allergies  Allergen Reactions   Metronidazole Itching and Rash    Medications Reviewed Today     Reviewed by Debbora Dus, [facility] (Pharmacist) on 07/07/21 at 1123  Med List Status: <None>   Medication Order Taking? Sig Documenting Provider Last Dose Status Informant  0.9 %  sodium chloride infusion [id number]   Irene Shipper, MD  Active   acetaminophen (TYLENOL) 325 MG tablet [id number] Yes Take 650 mg by mouth every 6 (six) hours as needed (pain). [provider] Taking Active Self  amLODipine (NORVASC) 10 MG tablet [id number] Yes Take 1 tablet (10 mg total) by mouth daily. Bensimhon, Shaune Pascal, MD Taking Active   amoxicillin (AMOXIL) 500 MG tablet [id number] No Take 4 tablets (2,000 mg total) by mouth as needed (1 hour prior to dental work).  Patient not taking: Reported on 06/13/2021   Jolaine Artist, MD Not Taking Active   aspirin EC 81 MG tablet [id number] Yes Take 81 mg by mouth daily. Swallow whole. [provider] Taking Active   Calcium Carb-Cholecalciferol (CALCIUM-VITAMIN D) 600-400 MG-UNIT TABS [id number] Yes Take 1 tablet by mouth daily. [provider] Taking Active Self  cholecalciferol (VITAMIN D) 1000 UNITS tablet [id number] Yes Take 1,000 Units by mouth daily. [provider] Taking Active Self  ferrous sulfate 325 (65 FE) MG tablet [id number] Yes Take 1 tablet (325 mg total) by mouth every Monday, Wednesday, and Friday. Ria Bush, MD Taking Active Self  folic acid (FOLVITE) 1 MG tablet [id number] Yes Take 1 tablet (1 mg total) by mouth daily. Ria Bush, MD Taking Active Self  furosemide (LASIX) 20 MG tablet [id number] Yes Take 10 mg by mouth daily. [provider] Taking Active Self  levothyroxine (SYNTHROID) 125 MCG tablet [id number] Yes Take 1 tablet (125 mcg total) by mouth daily before breakfast. Ria Bush, MD Taking Active Self  methotrexate  [facility]) 2.5 MG tablet [id number] Yes Take 15 mg by mouth every Friday. Caution:Chemotherapy. Protect from light. [provider] Taking Active Self  omeprazole (PRILOSEC) 20 MG capsule [id number] Yes Take 1 capsule (20 mg total) by mouth daily. Scarlette Shorts  N, MD Taking Active Self  sacubitril-valsartan (ENTRESTO) 97-103 MG [id number] Yes Take 1 tablet by mouth 2 (two) times daily. Darrick Grinder D, NP Taking Active Self  spironolactone (ALDACTONE) 25 MG tablet [id number] Yes Take 1 tablet (25 mg total) by mouth daily. Ria Bush, MD Taking Active   warfarin (COUMADIN) 4 MG tablet [id number] Yes TAKE 1 TO 2 TABLETS EVERY DAY  OR AS DIRECTED  BY  THE  COUMADIN  CLINIC Lorretta Harp, MD Taking Active             Patient Active Problem List   Diagnosis Date Noted   Long term (current) use of anticoagulants 11/11/2020   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   Encounter for preoperative dental examination    Poor dentition    Dental caries    Retained dental root    Chronic periodontitis    Pulmonary hypertension (HCC)    Chronic heart failure with preserved ejection fraction (HFpEF) ([place])    Dyspnea 10/21/2020   Unintentional weight loss 09/17/2020   Renal insufficiency 09/17/2020   Grade III diastolic dysfunction 71/24/5809   Mitral valve regurgitation    Rheumatoid arthritis ([place]) 12/16/2015   Health maintenance examination 04/23/2015   Advanced care planning/counseling discussion 04/23/2015   Medicare annual wellness visit, subsequent 11/03/2013   Vitamin D deficiency 11/03/2013   Microcytic anemia 09/02/2013   Persistent atrial fibrillation ([place]) 04/29/2013   HTN (hypertension), malignant 04/29/2013   Osteopenia 01/01/2010   POSTMENOPAUSAL STATUS 12/03/2009   SNORING 02/08/2008   GOITER, MULTINODULAR 02/16/2007   Hypothyroidism 02/16/2007   COLONIC POLYPS, ADENOMATOUS, HX OF 02/19/2005   History of lung abscess 05/23/1999     Immunization History  Administered Date(s) Administered   Fluad Quad(high Dose 65+) 03/24/2021   Influenza Whole 07/05/2009, 05/14/2010   Influenza, High Dose Seasonal PF 04/03/2014, 03/17/2017, 04/05/2018, 04/04/2019, 03/26/2020   Influenza,inj,Quad PF,6+ Mos 04/30/2013, 04/23/2016   Influenza-Unspecified 04/12/2015   PFIZER(Purple Top)SARS-COV-2 Vaccination 08/18/2019, 09/12/2019, 04/01/2020   Pneumococcal Conjugate-13 04/23/2015   Pneumococcal Polysaccharide-23 04/30/2013   Td 06/23/2003   Tdap 04/23/2016, 11/01/2017    Conditions to be addressed/monitored:  Hypertension, Atrial Fibrillation, Heart Failure, Hypothyroidism, and Anemia  Care Plan : [place]  Updates made by Debbora Dus, [place] since 07/07/2021 12:00 AM     Problem: CHL AMB "PATIENT-SPECIFIC PROBLEM"      Long-Range Goal: Disease Management   Start Date: 07/07/2021  Priority: High  Note:   Current Barriers:  None identified  Pharmacist Clinical Goal(s):  Patient will contact provider office for questions/concerns as evidenced notation of same in electronic health record through collaboration with PharmD and provider.   Interventions: 1:1 collaboration with Ria Bush, MD regarding development and update of comprehensive plan of care as evidenced by provider attestation and co-signature Inter-disciplinary care team collaboration (see longitudinal plan of care) Comprehensive medication review performed; medication list updated in electronic medical record  Hypertension (BP goal <140/90) -Controlled, per home readings  -Current treatment: Amlodipine 10 mg - 1 tablet daily Appropriate, Effective, Safe, Accessible -Medications previously tried: none reported  -Current home readings: checks home blood pressure twice daily  - ranges from 120/61 - 141/72 -Current dietary habits: She limits sugar in diet. She uses a salt substitute. -Current exercise habits: She walks for exercise ~30  minutes several days per week. She completed cardiac rehab recently. She enjoyed learning about importance of activity. -Denies hypotensive/hypertensive symptoms -Educated on Importance of home blood pressure monitoring; Proper BP monitoring  technique; -Counseled to monitor BP at home daily or as directed by cardiologist, document, and provide log at future appointments -Recommended to continue current medication  Atrial Fibrillation (Goal: prevent stroke and major bleeding) -Controlled, followed by cardiology -On warfarin S/P mitral valve replacement with bioprosthetic valve. INR monitored by cardiology. Pt reports INR has been stable recently, 2.4 on 07/01/2021. She knows her goal range (2-3). Next INR July 28, 2021. -CHADSVASC: 4 (age, CHF, HTN) This patients CHA2DS2-VASc Score and unadjusted Ischemic Stroke Rate (% per year) is equal to 4.8 % stroke rate/year from a score of 4 -Current treatment: Rate control: None Anticoagulation: Warfarin 4 mg - as directed (1 tablet daily except 1/2 tablet on Wednesday) Appropriate, Effective, Safe, Accessible -Medications previously tried: Eliquis - prior to valve replacement -Home BP and HR readings: see HTN section  -Denies any abnormal bruising/bleeding. -Counseled on increased risk of stroke due to Afib and benefits of anticoagulation for stroke prevention; -Recommended to continue current medication  Heart Failure (Goal: manage symptoms and prevent exacerbations) -Controlled, followed by cardiology. Reports she used to have SOB and swelling, but none in a long time. Symptoms controlled. -Last ejection fraction: 50-55% (Date: 12/16/20) - lowest EF 50%, never < 40% -HF type: Diastolic -NYHA Class: III (marked limitation of activity) -AHA HF Stage: C (Heart disease and symptoms present) -Current treatment: Furosemide 20 mg - 1 tablet daily Appropriate, Effective, Safe, Accessible Entresto 97-103 mg - 1 tablet twice daily Appropriate,  Effective, Safe, Accessible Spironolactone 25 mg - 1 tablet daily Appropriate, Effective, Safe, Accessible -Medications previously tried: none   -She checks her weight daily after using restroom. This morning 140.6 lbs and yesterday 139.8 lbs.  -Educated on Importance of weighing daily; if you gain more than 3 pounds in one day or 5 pounds in one week, contact cardiology -Recommended to continue current medication  Osteopenia (Goal: Prevent fractures and improve bone density) -Controlled, on appropriate therapy. No recent falls. -Last DEXA Scan: DEXA 08/2019 - T -2.0 R hip, 0.7 spine   T-Score femoral neck: -1.0  T-Score total hip: -2.0  T-Score lumbar spine: 0.7  T-Score forearm radius: N/A  10-year probability of major osteoporotic fracture: 7%  10-year probability of hip fracture: 1.6% -Patient is not a candidate for pharmacologic treatment -Current treatment  Vitamin D3 1000 IU daily  Appropriate, Effective, Safe, Accessible Calcium-Vitamin D 600-400 mg daily Appropriate, Effective, Safe, Accessible -Medications previously tried: none   -Recommend weight-bearing and muscle strengthening exercises for building and maintaining bone density. -Recommended to continue current medication  Hypothyroidism (Goal: TSH, T4 WNL) -Controlled - Labs updated 08/2020, stable -Current treatment  Levothyroxine 125 mcg - 1 tablet daily Appropriate, Effective, Safe, Accessible -Medications previously tried: none reported  -Recommended to continue current medication  Anemia (Goal: Labs WNL, symptom control) -Controlled, followed by gastroenterology  -Current treatment  Ferrous sulfate 325 mg - 1 tablet Mon, Wed, Friday Appropriate, Effective, Safe, Accessible -Medications previously tried: none reported  -Recommended to continue current medication  Other -  - She reports seeing rheumatologist for RA every 6 months. Reports medications work well. She continues methotrexate 15 mg weekly and folic  acid 1 mg daily. - She reports taking omeprazole 20 mg 1 capsule daily. No GERD history. She denies symptoms of acid reflux. Reports PPI started by cardiology for GI protection.  Patient Goals/Self-Care Activities Patient will:  - take medications as prescribed as evidenced by patient report and record review  Follow Up Plan: Telephone follow up appointment with care  management team member scheduled for:  - CCM visit in 28 months - Health concierge BP review in 6 months     Medication Assistance: None required.  Patient affirms current coverage meets needs.  Compliance/Adherence/Medication fill history: Care Gaps: None  Star-Rating Drugs: Medication:                Last Fill:         Day Supply Entresto 97-103 mg    04/29/21            90           Patient's preferred pharmacy is: [place], [address] [place] [address] Phone: [phone] Fax: [place], [place] [address] AT [facility] [address] [place] [address] Phone: [phone] Fax: [phone]  [place] for maintenance meds [facility] for acute meds Uses pill box? Yes Pt endorses 100% compliance - she does not miss doses  We discussed: Current pharmacy is preferred with insurance plan and patient is satisfied with pharmacy services Patient decided to: Continue current medication management strategy  Care Plan and Follow Up Patient Decision:  Patient agrees to Care Plan and Follow-up.  Debbora Dus, PharmD Clinical Pharmacist Practitioner [facility] [phone]

## 2021-07-04 NOTE — Addendum Note (Signed)
Addended by: Emelia Salisbury C on: 07/04/2021 01:17 PM   Modules accepted: Orders

## 2021-07-07 ENCOUNTER — Ambulatory Visit (INDEPENDENT_AMBULATORY_CARE_PROVIDER_SITE_OTHER): Payer: Medicare HMO

## 2021-07-07 ENCOUNTER — Telehealth: Payer: Self-pay

## 2021-07-07 ENCOUNTER — Other Ambulatory Visit: Payer: Self-pay

## 2021-07-07 DIAGNOSIS — M85851 Other specified disorders of bone density and structure, right thigh: Secondary | ICD-10-CM

## 2021-07-07 DIAGNOSIS — E039 Hypothyroidism, unspecified: Secondary | ICD-10-CM

## 2021-07-07 DIAGNOSIS — I5032 Chronic diastolic (congestive) heart failure: Secondary | ICD-10-CM

## 2021-07-07 DIAGNOSIS — I4819 Other persistent atrial fibrillation: Secondary | ICD-10-CM

## 2021-07-07 DIAGNOSIS — I10 Essential (primary) hypertension: Secondary | ICD-10-CM

## 2021-07-07 MED ORDER — SPIRONOLACTONE 25 MG PO TABS: 25.0000 mg | ORAL_TABLET | Freq: Every day | ORAL | 6 refills | Status: DC

## 2021-07-07 MED ORDER — SPIRONOLACTONE 25 MG PO TABS: 25.0000 mg | ORAL_TABLET | Freq: Every day | ORAL | 1 refills | Status: DC

## 2021-07-07 NOTE — Telephone Encounter (Signed)
Patient aware spironolactone was sent to [facility] today, she appreciated this. She would like a long term rx sent to mail order.  Debbora Dus, PharmD Clinical Pharmacist Practitioner [facility] [phone]

## 2021-07-07 NOTE — Addendum Note (Signed)
Addended by: Ria Bush on: 07/07/2021 08:09 AM   Modules accepted: Orders

## 2021-07-07 NOTE — Patient Instructions (Addendum)
July 07, 2021  Dear Joanna Hood,  It was a pleasure meeting you during our initial appointment on July 07, 2021. Below is a summary of the goals we discussed and components of chronic care management. Please contact me anytime with questions or concerns.   Visit Information  Patient Care Plan: CCM Pharmacy Care Plan     Problem Identified: CHL AMB "PATIENT-SPECIFIC PROBLEM"      Long-Range Goal: Disease Management   Start Date: 07/07/2021  Priority: High  Note:   Current Barriers:  None identified  Pharmacist Clinical Goal(s):  Patient will contact provider office for questions/concerns as evidenced notation of same in electronic health record through collaboration with PharmD and provider.   Interventions: 1:1 collaboration with Ria Bush, MD regarding development and update of comprehensive plan of care as evidenced by provider attestation and co-signature Inter-disciplinary care team collaboration (see longitudinal plan of care) Comprehensive medication review performed; medication list updated in electronic medical record  Hypertension (BP goal <140/90) -Controlled, per home readings  -Current treatment: Amlodipine 10 mg - 1 tablet daily Appropriate, Effective, Safe, Accessible -Medications previously tried: none reported  -Current home readings: checks home blood pressure twice daily  - ranges from 120/61 - 141/72 -Current dietary habits: She limits sugar in diet. She uses a salt substitute. -Current exercise habits: She walks for exercise ~30 minutes several days per week. She completed cardiac rehab recently. She enjoyed learning about importance of activity. -Denies hypotensive/hypertensive symptoms -Educated on Importance of home blood pressure monitoring; Proper BP monitoring technique; -Counseled to monitor BP at home daily or as directed by cardiologist, document, and provide log at future appointments -Recommended to continue current  medication  Atrial Fibrillation (Goal: prevent stroke and major bleeding) -Controlled, followed by cardiology -On warfarin S/P mitral valve replacement with bioprosthetic valve. INR monitored by cardiology. Pt reports INR has been stable recently, 2.4 on 07/01/2021. She knows her goal range (2-3). Next INR July 28, 2021. -CHADSVASC: 4 (age, CHF, HTN) This patients CHA2DS2-VASc Score and unadjusted Ischemic Stroke Rate (% per year) is equal to 4.8 % stroke rate/year from a score of 4 -Current treatment: Rate control: None Anticoagulation: Warfarin 4 mg - as directed (1 tablet daily except 1/2 tablet on Wednesday) Appropriate, Effective, Safe, Accessible -Medications previously tried: Eliquis - prior to valve replacement -Home BP and HR readings: see HTN section  -Denies any abnormal bruising/bleeding. -Counseled on increased risk of stroke due to Afib and benefits of anticoagulation for stroke prevention; -Recommended to continue current medication  Heart Failure (Goal: manage symptoms and prevent exacerbations) -Controlled, followed by cardiology. Reports she used to have SOB and swelling, but none in a long time. Symptoms controlled. -Last ejection fraction: 50-55% (Date: 12/16/20) - lowest EF 50%, never < 40% -HF type: Diastolic -NYHA Class: III (marked limitation of activity) -AHA HF Stage: C (Heart disease and symptoms present) -Current treatment: Furosemide 20 mg - 1 tablet daily Appropriate, Effective, Safe, Accessible Entresto 97-103 mg - 1 tablet twice daily Appropriate, Effective, Safe, Accessible Spironolactone 25 mg - 1 tablet daily Appropriate, Effective, Safe, Accessible -Medications previously tried: none   -She checks her weight daily after using restroom. This morning 140.6 lbs and yesterday 139.8 lbs.  -Educated on Importance of weighing daily; if you gain more than 3 pounds in one day or 5 pounds in one week, contact cardiology -Recommended to continue current  medication  Osteopenia (Goal: Prevent fractures and improve bone density) -Controlled, on appropriate therapy. No recent falls. -Last DEXA  Scan: DEXA 08/2019 - T -2.0 R hip, 0.7 spine   T-Score femoral neck: -1.0  T-Score total hip: -2.0  T-Score lumbar spine: 0.7  T-Score forearm radius: N/A  10-year probability of major osteoporotic fracture: 7%  10-year probability of hip fracture: 1.6% -Patient is not a candidate for pharmacologic treatment -Current treatment  Vitamin D3 1000 IU daily  Appropriate, Effective, Safe, Accessible Calcium-Vitamin D 600-400 mg daily Appropriate, Effective, Safe, Accessible -Medications previously tried: none   -Recommend weight-bearing and muscle strengthening exercises for building and maintaining bone density. -Recommended to continue current medication  Hypothyroidism (Goal: TSH, T4 WNL) -Controlled - Labs updated 08/2020, stable -Current treatment  Levothyroxine 125 mcg - 1 tablet daily Appropriate, Effective, Safe, Accessible -Medications previously tried: none reported  -Recommended to continue current medication  Anemia (Goal: Labs WNL, symptom control) -Controlled, followed by gastroenterology  -Current treatment  Ferrous sulfate 325 mg - 1 tablet Mon, Wed, Friday Appropriate, Effective, Safe, Accessible -Medications previously tried: none reported  -Recommended to continue current medication  Other -  - She reports seeing rheumatologist for RA every 6 months. Reports medications work well. She continues methotrexate 15 mg weekly and folic acid 1 mg daily. - She reports taking omeprazole 20 mg 1 capsule daily. No GERD history. She denies symptoms of acid reflux. Reports PPI started by cardiology for GI protection.  Patient Goals/Self-Care Activities Patient will:  - take medications as prescribed as evidenced by patient report and record review  Follow Up Plan: Telephone follow up appointment with care management team member scheduled  for:  - CCM visit in 12 months - Health concierge BP review in 6 months     Ms. Bertucci was given information about Chronic Care Management services today including:  CCM service includes personalized support from designated clinical staff supervised by her physician, including individualized plan of care and coordination with other care providers 24/7 contact phone numbers for assistance for urgent and routine care needs. Standard insurance, coinsurance, copays and deductibles apply for chronic care management only during months in which we provide at least 20 minutes of these services. Most insurances cover these services at 100%, however patients may be responsible for any copay, coinsurance and/or deductible if applicable. This service may help you avoid the need for more expensive face-to-face services. Only one practitioner may furnish and bill the service in a calendar month. The patient may stop CCM services at any time (effective at the end of the month) by phone call to the office staff.  Patient agreed to services and verbal consent obtained.   Patient verbalizes understanding of instructions and care plan provided today and agrees to view in [place]. Active MyChart status confirmed with patient.    Debbora Dus, PharmD Clinical Pharmacist Practitioner [facility] [phone]

## 2021-07-07 NOTE — Telephone Encounter (Signed)
E-scribed refill to [facility] order pharmacy, #90/1.

## 2021-07-14 ENCOUNTER — Other Ambulatory Visit (HOSPITAL_COMMUNITY): Payer: Self-pay | Admitting: Adult Health

## 2021-07-14 DIAGNOSIS — I10 Essential (primary) hypertension: Secondary | ICD-10-CM

## 2021-07-22 DIAGNOSIS — I10 Essential (primary) hypertension: Secondary | ICD-10-CM

## 2021-07-22 DIAGNOSIS — I5032 Chronic diastolic (congestive) heart failure: Secondary | ICD-10-CM | POA: Diagnosis not present

## 2021-07-22 DIAGNOSIS — E039 Hypothyroidism, unspecified: Secondary | ICD-10-CM | POA: Diagnosis not present

## 2021-07-22 DIAGNOSIS — I4819 Other persistent atrial fibrillation: Secondary | ICD-10-CM

## 2021-08-05 ENCOUNTER — Ambulatory Visit: Payer: Medicare HMO | Admitting: *Deleted

## 2021-08-05 ENCOUNTER — Other Ambulatory Visit: Payer: Self-pay

## 2021-08-05 DIAGNOSIS — I4819 Other persistent atrial fibrillation: Secondary | ICD-10-CM

## 2021-08-05 DIAGNOSIS — Z5181 Encounter for therapeutic drug level monitoring: Secondary | ICD-10-CM | POA: Diagnosis not present

## 2021-08-05 DIAGNOSIS — Z953 Presence of xenogenic heart valve: Secondary | ICD-10-CM | POA: Diagnosis not present

## 2021-08-05 LAB — POCT INR: INR: 5.2 — AB (ref 2.0–3.0)

## 2021-08-05 NOTE — Patient Instructions (Signed)
Description   Hold warfarin today (2/14) and tomorrow (2/15) On 2/16 take 1/2 a tablet.  Then continue taking  warfarin 1 tablet daily, except 1/2 a tablet on Wednesdays.  Recheck 2 weeks.  [facility] [phone]

## 2021-08-12 ENCOUNTER — Other Ambulatory Visit (HOSPITAL_COMMUNITY): Payer: Self-pay

## 2021-08-12 MED ORDER — AMLODIPINE BESYLATE 10 MG PO TABS: 10.0000 mg | ORAL_TABLET | Freq: Every day | ORAL | 3 refills | Status: DC

## 2021-08-19 ENCOUNTER — Other Ambulatory Visit: Payer: Self-pay

## 2021-08-19 ENCOUNTER — Ambulatory Visit (INDEPENDENT_AMBULATORY_CARE_PROVIDER_SITE_OTHER): Payer: Medicare HMO | Admitting: *Deleted

## 2021-08-19 DIAGNOSIS — I4819 Other persistent atrial fibrillation: Secondary | ICD-10-CM

## 2021-08-19 DIAGNOSIS — Z953 Presence of xenogenic heart valve: Secondary | ICD-10-CM

## 2021-08-19 DIAGNOSIS — Z5181 Encounter for therapeutic drug level monitoring: Secondary | ICD-10-CM | POA: Diagnosis not present

## 2021-08-19 LAB — POCT INR: INR: 2.9 (ref 2.0–3.0)

## 2021-08-19 NOTE — Patient Instructions (Addendum)
Description    Then continue taking  warfarin 1 tablet daily, except 1/2 a tablet on Wednesdays.  Recheck 3 weeks.  [facility] [phone]

## 2021-08-28 ENCOUNTER — Other Ambulatory Visit: Payer: Self-pay | Admitting: Family Medicine

## 2021-08-28 ENCOUNTER — Other Ambulatory Visit: Payer: Self-pay | Admitting: Cardiovascular Disease

## 2021-09-03 ENCOUNTER — Telehealth (HOSPITAL_COMMUNITY): Payer: Self-pay | Admitting: Vascular Surgery

## 2021-09-03 NOTE — Telephone Encounter (Signed)
Left pt message to change 3/23 to another day/ db is out of office ?

## 2021-09-04 NOTE — Progress Notes (Signed)
? ?Subjective:  ? Joanna Hood is a 76 y.o. female who presents for Medicare Annual (Subsequent) preventive examination. ? ?I connected with Crista Curb today by telephone and verified that I am speaking with the correct person using two identifiers. ?Location patient: home ?Location provider: work ?Persons participating in the virtual visit: patient, nurse.  ?  ?I discussed the limitations, risks, security and privacy concerns of performing an evaluation and management service by telephone and the availability of in person appointments. I also discussed with the patient that there may be a patient responsible charge related to this service. The patient expressed understanding and verbally consented to this telephonic visit.  ?  ?Interactive audio and video telecommunications were attempted between this provider and patient, however failed, due to patient having technical difficulties OR patient did not have access to video capability.  We continued and completed visit with audio only. ? ?Some vital signs may be absent or patient reported.  ? ?Time Spent with patient on telephone encounter: 20 minutes ? ?Review of Systems    ? ?Cardiac Risk Factors include: advanced age (>46mn, >>40women);hypertension ? ?   ?Objective:  ?  ?Today's Vitals  ? 09/05/21 1402  ?Weight: 135 lb (61.2 kg)  ?Height: _0  (1.575 m)  ? ?Body mass index is 24.69 kg/m?. ? ?Advanced Directives 09/05/2021 10/24/2020 10/21/2020 09/04/2020 06/02/2019 05/26/2018 04/23/2016  ?Does Patient Have a Medical Advance Directive? _1  No No  ?Does patient want to make changes to medical advance directive? Yes (MAU/Ambulatory/Procedural Areas - Information given) - - No - Patient declined - - -  ?Would patient like information on creating a medical advance directive? - No - Patient declined - - No - Patient declined No - Patient declined No - patient declined information  ?Pre-existing out of facility DNR order (yellow form or pink MOST  form) - - - - - - -  ? ? ?Current Medications (verified) ?Outpatient Encounter Medications as of 09/05/2021  ?Medication Sig  ? acetaminophen (TYLENOL) 325 MG tablet Take 650 mg by mouth every 6 (six) hours as needed (pain).  ? amLODipine (NORVASC) 10 MG tablet Take 1 tablet (10 mg total) by mouth daily.  ? aspirin EC 81 MG tablet Take 81 mg by mouth daily. Swallow whole.  ? Calcium Carb-Cholecalciferol (CALCIUM-VITAMIN D) 600-400 MG-UNIT TABS Take 1 tablet by mouth daily.  ? cholecalciferol (VITAMIN D) 1000 UNITS tablet Take 1,000 Units by mouth daily.  ? ENTRESTO 97-103 MG TAKE 1 TABLET TWICE DAILY  ? ferrous sulfate 325 (65 FE) MG tablet Take 1 tablet (325 mg total) by mouth every Monday, Wednesday, and Friday.  ? folic acid (FOLVITE) 1 MG tablet Take 1 tablet (1 mg total) by mouth daily.  ? furosemide (LASIX) 20 MG tablet Take 10 mg by mouth daily.  ? levothyroxine (SYNTHROID) 125 MCG tablet TAKE 1 TABLET  DAILY BEFORE BREAKFAST.  ? methotrexate (RHEUMATREX) 2.5 MG tablet Take 15 mg by mouth every Friday. Caution:Chemotherapy. Protect from light.  ? omeprazole (PRILOSEC) 20 MG capsule Take 1 capsule (20 mg total) by mouth daily.  ? spironolactone (ALDACTONE) 25 MG tablet Take 1 tablet (25 mg total) by mouth daily.  ? warfarin (COUMADIN) 4 MG tablet TAKE 1 TO 2 TABLETS EVERY DAY  OR AS DIRECTED  BY  THE  COUMADIN  CLINIC  ? amoxicillin (AMOXIL) 500 MG tablet Take 4 tablets (2,000 mg total) by mouth as needed (1 hour prior to dental work). (Patient  not taking: Reported on 06/13/2021)  ? ?Facility-Administered Encounter Medications as of 09/05/2021  ?Medication  ? 0.9 %  sodium chloride infusion  ? ? ?Allergies (verified) ?Metronidazole  ? ?History: ?Past Medical History:  ?Diagnosis Date  ? Anemia   ? Atrial fibrillation ([place])   ? Atrial fibrillation with RVR ([place]) 2014  ? persistent  ? Cataract   ? Chronic diastolic CHF (congestive heart failure) ([place])   ? HTN  ? Chronic diastolic congestive heart failure ([place])   ?  COVID-19 virus infection 01/20/2019  ? ESOPHAGEAL STRICTURE 02/19/2005  ? Qualifier: Diagnosis of  By: Amil Amen MD, Benjamine Mola    ? Goiter   ? Heart murmur   ? History of anemia   ? unclear cause, now resolved  ? History of chicken pox   ? History of colon polyps   ? benign  ? HLD (hyperlipidemia)   ? Hypothyroidism   ? Malignant hypertension longstanding  ? Microcytosis   ? Mitral valve regurgitation   ? Osteopenia 12/2013  ? T -2.3 hip  ? Osteoporosis   ? Pulmonary hypertension ([place])   ? Rheumatoid arthritis ([place]) 11/2015  ? +RF, +CCP, ESR 49, synovitis on exam and [place] 11/2015 Trudie Reed)  ? Right lower lobe pneumonia 07/24/2014  ? 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 29 mm Medtronic Mosaic stented porcine bioprosthetic tissue valve  ? Tricuspid regurgitation   ? Vitamin D deficiency   ? ?Past Surgical History:  ?Procedure Laterality Date  ? CARDIAC CATHETERIZATION  2014  ? normal per patient  ? CLIPPING OF ATRIAL APPENDAGE N/A 10/28/2020  ? Procedure: CLIPPING OF ATRIAL APPENDAGE USING ATRICURE  CLIP SIZE 45MM;  Surgeon: Rexene Alberts, MD;  Location: [facility];  Service: Open Heart Surgery;  Laterality: N/A;  ? COLONOSCOPY  11/2015  ? TA, int hem, rpt 5 yrs Henrene Pastor)  ? COLONOSCOPY  05/2021  ? WNL no repeat recommended Henrene Pastor)  ? LEFT HEART CATHETERIZATION WITH CORONARY ANGIOGRAM N/A 05/01/2013  ? Procedure: LEFT HEART CATHETERIZATION WITH CORONARY ANGIOGRAM;  Surgeon: Leonie Man, MD;  [facility] CATH LAB;  Service: Cardiovascular;  Laterality: N/A;  ? MITRAL VALVE REPAIR N/A 10/28/2020  ? Procedure: MITRAL VALVE REPLACEMENT (MVR) USING MEDTRONIC MOSAIC VALVE SIZE 29MM;  Surgeon: Rexene Alberts, MD;  Location: [place];  Service: Open Heart Surgery;  Laterality: N/A;  ? MULTIPLE EXTRACTIONS WITH ALVEOLOPLASTY N/A 10/24/2020  ? Procedure: MULTIPLE EXTRACTION WITH ALVEOLOPLASTY;  Surgeon: Charlaine Dalton, DMD;  Location: [place];  Service: Dentistry;  Laterality: N/A;  ? MULTIPLE TOOTH  EXTRACTIONS  10/2020  ? Extractions of teeth numbers 3, 4, 7, 8, 9, 10, 11 and 14 - prior to valvular surgery  ? PARTIAL HYSTERECTOMY  1970  ? fibroids  ? RIGHT HEART CATH N/A 10/25/2020  ? Procedure: RIGHT HEART CATH;  Surgeon: Jolaine Artist, MD;  Location: [place] CV LAB;  Service: Cardiovascular;  Laterality: N/A;  ? RIGHT/LEFT HEART CATH AND CORONARY ANGIOGRAPHY N/A 10/21/2020  ? Procedure: RIGHT/LEFT HEART CATH AND CORONARY ANGIOGRAPHY;  Surgeon: Lorretta Harp, MD;  Location: [place] CV LAB;  Service: Cardiovascular;  Laterality: N/A;  ? TEE WITHOUT CARDIOVERSION N/A 10/21/2020  ? Procedure: TRANSESOPHAGEAL ECHOCARDIOGRAM (TEE);  Surgeon: Lelon Perla, MD;  Location: [facility] ENDOSCOPY;  Service: Cardiovascular;  Laterality: N/A;  ? TEE WITHOUT CARDIOVERSION N/A 10/28/2020  ? Procedure: TRANSESOPHAGEAL ECHOCARDIOGRAM (TEE);  Surgeon: Rexene Alberts, MD;  Location: [place];  Service: Open Heart Surgery;  Laterality: N/A;  ? ?Family History  ?Problem Relation Age of Onset  ? Cancer Mother   ?     ovarian  ? Sudden death Mother   ? Diabetes Sister   ? Hypertension Sister   ? Cancer Maternal Grandmother   ?     lung  ? Heart disease Maternal Grandmother   ? Hypertension Cousin   ? CAD Neg Hx   ? Stroke Neg Hx   ? Colon cancer Neg Hx   ? Esophageal cancer Neg Hx   ? Rectal cancer Neg Hx   ? Colon polyps Neg Hx   ? Stomach cancer Neg Hx   ? ?Social History  ? ?Socioeconomic History  ? Marital status: Single  ?  Spouse name: Not on file  ? Number of children: 1  ? Years of education: Not on file  ? Highest education level: Not on file  ?Occupational History  ? Occupation: Retired  ?Tobacco Use  ? Smoking status: Former  ?  Types: Cigarettes  ?  Quit date: 05/16/1969  ?  Years since quitting: 52.3  ? Smokeless tobacco: Former  ?Vaping Use  ? Vaping Use: Never used  ?Substance and Sexual Activity  ? Alcohol use: No  ?  Alcohol/week: 0.0 standard drinks  ? Drug use: No  ? Sexual activity: Not Currently   ?Other Topics Concern  ? Not on file  ?Social History Narrative  ? Lives with grand-daughter and godson  ? 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 Edu:

## 2021-09-05 ENCOUNTER — Ambulatory Visit (INDEPENDENT_AMBULATORY_CARE_PROVIDER_SITE_OTHER): Payer: Medicare HMO

## 2021-09-05 ENCOUNTER — Other Ambulatory Visit: Payer: Self-pay

## 2021-09-05 VITALS — Ht 62.0 in | Wt 135.0 lb

## 2021-09-05 DIAGNOSIS — Z0000 Encounter for general adult medical examination without abnormal findings: Secondary | ICD-10-CM

## 2021-09-05 DIAGNOSIS — Z780 Asymptomatic menopausal state: Secondary | ICD-10-CM | POA: Diagnosis not present

## 2021-09-05 NOTE — Patient Instructions (Signed)
[name] , ?Thank you for taking time to complete  your Medicare Wellness Visit. I appreciate your ongoing commitment to your health goals. Please review the following plan we discussed and let me know if I can assist you in the future.  ? ?Screening recommendations/referrals: ?Colonoscopy: up to date, completed 06/13/21 ?Mammogram: up to date , due 05/06/21 ?Bone Density: due, ordered today someone will call to schedule an appointment ?ophthalmology/optometry visit : up to date ?Recommended yearly dental visit for hygiene and checkup ? ?Vaccinations: ?Influenza vaccine: up to date  ?Pneumococcal vaccine: up to date ?Tdap vaccine: up to date , due 04/23/26 ?Shingles vaccine: Discuss with pharmacy, if you change your mind   ?Covid-19:newest booster available at your local pharmacy ? ?Advanced directives: information available at your next appointment  ? ?Conditions/risks identified: see problem list  ? ?Next appointment: Follow up in one year for your annual wellness visit  ? ? ?Preventive Care 75 Years and Older, Female ?Preventive care refers to lifestyle choices and visits with your health care provider that can promote health and wellness. ?What does preventive care include? ?A yearly physical exam. This is also called an annual well check. ?Dental exams once or twice a year. ?Routine eye exams. Ask your health care provider how often you should have your eyes checked. ?Personal lifestyle choices, including: ?Daily care of your teeth and gums. ?Regular physical activity. ?Eating a healthy diet. ?Avoiding tobacco and drug use. ?Limiting alcohol use. ?Practicing safe sex. ?Taking low-dose aspirin every day. ?Taking vitamin and mineral supplements as recommended by your health care provider. ?What happens during an annual well check? ?The services and screenings done by your health care provider during your annual well check will depend on your age, overall health, lifestyle risk factors, and family history of  disease. ?Counseling  ?Your health care provider may ask you questions about your: ?Alcohol use. ?Tobacco use. ?Drug use. ?Emotional well-being. ?Home and relationship well-being. ?Sexual activity. ?Eating habits. ?History of falls. ?Memory and ability to understand (cognition). ?Work and work Statistician. ?Reproductive health. ?Screening  ?You may have the following tests or measurements: ?Height, weight, and BMI. ?Blood pressure. ?Lipid and cholesterol levels. These may be checked every 5 years, or more frequently if you are over 24 years old. ?Skin check. ?Lung cancer screening. You may have this screening every year starting at age 56 if you have a 30-pack-year history of smoking and currently smoke or have quit within the past 15 years. ?Fecal occult blood test (FOBT) of the stool. You may have this test every year starting at age 17. ?Flexible sigmoidoscopy or colonoscopy. You may have a sigmoidoscopy every 5 years or a colonoscopy every 10 years starting at age 58. ?Hepatitis C blood test. ?Hepatitis B blood test. ?Sexually transmitted disease (STD) testing. ?Diabetes screening. This is done by checking your blood sugar (glucose) after you have not eaten for a while (fasting). You may have this done every 1-3 years. ?Bone density scan. This is done to screen for osteoporosis. You may have this done starting at age 25. ?Mammogram. This may be done every 1-2 years. Talk to your health care provider about how often you should have regular mammograms. ?Talk with your health care provider about your test results, treatment options, and if necessary, the need for more tests. ?Vaccines  ?Your health care provider may recommend certain vaccines, such as: ?Influenza vaccine. This is recommended every year. ?Tetanus, diphtheria, and acellular pertussis (Tdap, Td) vaccine. You may need a Td booster  every 10 years. ?Zoster vaccine. You may need this after age 54. ?Pneumococcal 13-valent conjugate (PCV13) vaccine. One  dose is recommended after age 61. ?Pneumococcal polysaccharide (PPSV23) vaccine. One dose is recommended after age 27. ?Talk to your health care provider about which screenings and vaccines you need and how often you need them. ?This information is not intended to replace advice given to you by your health care provider. Make sure you discuss any questions you have with your health care provider. ?Document Released: 07/05/2015 Document Revised: 02/26/2016 Document Reviewed: 04/09/2015 ?Elsevier Interactive Patient Education ? 2017 [place]. ? ?Fall Prevention in the Home ?Falls can cause injuries. They can happen to people of all ages. There are many things you can do to make your home safe and to help prevent falls. ?What can I do on the outside of my home? ?Regularly fix the edges of walkways and driveways and fix any cracks. ?Remove anything that might make you trip as you walk through a door, such as a raised step or threshold. ?Trim any bushes or trees on the path to your home. ?Use bright outdoor lighting. ?Clear any walking paths of anything that might make someone trip, such as rocks or tools. ?Regularly check to see if handrails are loose or broken. Make sure that both sides of any steps have handrails. ?Any raised decks and porches should have guardrails on the edges. ?Have any leaves, snow, or ice cleared regularly. ?Use sand or salt on walking paths during winter. ?Clean up any spills in your garage right away. This includes oil or grease spills. ?What can I do in the bathroom? ?Use night lights. ?Install grab bars by the toilet and in the tub and shower. Do not use towel bars as grab bars. ?Use non-skid mats or decals in the tub or shower. ?If you need to sit down in the shower, use a plastic, non-slip stool. ?Keep the floor dry. Clean up any water that spills on the floor as soon as it happens. ?Remove soap buildup in the tub or shower regularly. ?Attach bath mats securely with double-sided  non-slip rug tape. ?Do not have throw rugs and other things on the floor that can make you trip. ?What can I do in the bedroom? ?Use night lights. ?Make sure that you have a light by your bed that is easy to reach. ?Do not use any sheets or blankets that are too big for your bed. They should not hang down onto the floor. ?Have a firm chair that has side arms. You can use this for support while you get dressed. ?Do not have throw rugs and other things on the floor that can make you trip. ?What can I do in the kitchen? ?Clean up any spills right away. ?Avoid walking on wet floors. ?Keep items that you use a lot in easy-to-reach places. ?If you need to reach something above you, use a strong step stool that has a grab bar. ?Keep electrical cords out of the way. ?Do not use floor polish or wax that makes floors slippery. If you must use wax, use non-skid floor wax. ?Do not have throw rugs and other things on the floor that can make you trip. ?What can I do with my stairs? ?Do not leave any items on the stairs. ?Make sure that there are handrails on both sides of the stairs and use them. Fix handrails that are broken or loose. Make sure that handrails are as long as the stairways. ?Check any carpeting to  make sure that it is firmly attached to the stairs. Fix any carpet that is loose or worn. ?Avoid having throw rugs at the top or bottom of the stairs. If you do have throw rugs, attach them to the floor with carpet tape. ?Make sure that you have a light switch at the top of the stairs and the bottom of the stairs. If you do not have them, ask someone to add them for you. ?What else can I do to help prevent falls? ?Wear shoes that: ?Do not have high heels. ?Have rubber bottoms. ?Are comfortable and fit you well. ?Are closed at the toe. Do not wear sandals. ?If you use a stepladder: ?Make sure that it is fully opened. Do not climb a closed stepladder. ?Make sure that both sides of the stepladder are locked into place. ?Ask  someone to hold it for you, if possible. ?Clearly mark and make sure that you can see: ?Any grab bars or handrails. ?First and last steps. ?Where the edge of each step is. ?Use tools that help you move around (mob

## 2021-09-08 ENCOUNTER — Other Ambulatory Visit: Payer: Self-pay

## 2021-09-08 ENCOUNTER — Ambulatory Visit (INDEPENDENT_AMBULATORY_CARE_PROVIDER_SITE_OTHER): Payer: Medicare HMO

## 2021-09-08 DIAGNOSIS — I4819 Other persistent atrial fibrillation: Secondary | ICD-10-CM | POA: Diagnosis not present

## 2021-09-08 DIAGNOSIS — Z953 Presence of xenogenic heart valve: Secondary | ICD-10-CM

## 2021-09-08 DIAGNOSIS — Z7901 Long term (current) use of anticoagulants: Secondary | ICD-10-CM

## 2021-09-08 LAB — POCT INR: INR: 7.6 — AB (ref 2.0–3.0)

## 2021-09-08 NOTE — Patient Instructions (Signed)
HOLD TODAY, Tuesday, Wednesday AND Thursday.  Eat greens tonight. ?Then continue taking  warfarin 1 tablet daily, except 1/2 a tablet on Wednesdays.  Recheck  weeks.  [facility] [phone] ? ?

## 2021-09-11 ENCOUNTER — Encounter (HOSPITAL_COMMUNITY): Payer: Medicare HMO | Admitting: Internal Medicine

## 2021-09-14 ENCOUNTER — Other Ambulatory Visit: Payer: Self-pay | Admitting: Family Medicine

## 2021-09-14 DIAGNOSIS — M069 Rheumatoid arthritis, unspecified: Secondary | ICD-10-CM

## 2021-09-14 DIAGNOSIS — I4819 Other persistent atrial fibrillation: Secondary | ICD-10-CM

## 2021-09-14 DIAGNOSIS — E039 Hypothyroidism, unspecified: Secondary | ICD-10-CM

## 2021-09-14 DIAGNOSIS — E559 Vitamin D deficiency, unspecified: Secondary | ICD-10-CM

## 2021-09-14 DIAGNOSIS — I10 Essential (primary) hypertension: Secondary | ICD-10-CM

## 2021-09-14 DIAGNOSIS — D509 Iron deficiency anemia, unspecified: Secondary | ICD-10-CM

## 2021-09-14 DIAGNOSIS — N289 Disorder of kidney and ureter, unspecified: Secondary | ICD-10-CM

## 2021-09-15 ENCOUNTER — Other Ambulatory Visit: Payer: Self-pay

## 2021-09-15 ENCOUNTER — Ambulatory Visit (INDEPENDENT_AMBULATORY_CARE_PROVIDER_SITE_OTHER): Payer: Medicare HMO

## 2021-09-15 ENCOUNTER — Other Ambulatory Visit (INDEPENDENT_AMBULATORY_CARE_PROVIDER_SITE_OTHER): Payer: Medicare HMO

## 2021-09-15 DIAGNOSIS — N289 Disorder of kidney and ureter, unspecified: Secondary | ICD-10-CM | POA: Diagnosis not present

## 2021-09-15 DIAGNOSIS — I4819 Other persistent atrial fibrillation: Secondary | ICD-10-CM

## 2021-09-15 DIAGNOSIS — D509 Iron deficiency anemia, unspecified: Secondary | ICD-10-CM | POA: Diagnosis not present

## 2021-09-15 DIAGNOSIS — Z7901 Long term (current) use of anticoagulants: Secondary | ICD-10-CM | POA: Diagnosis not present

## 2021-09-15 DIAGNOSIS — E559 Vitamin D deficiency, unspecified: Secondary | ICD-10-CM | POA: Diagnosis not present

## 2021-09-15 DIAGNOSIS — M069 Rheumatoid arthritis, unspecified: Secondary | ICD-10-CM

## 2021-09-15 DIAGNOSIS — Z953 Presence of xenogenic heart valve: Secondary | ICD-10-CM

## 2021-09-15 DIAGNOSIS — I10 Essential (primary) hypertension: Secondary | ICD-10-CM | POA: Diagnosis not present

## 2021-09-15 DIAGNOSIS — E039 Hypothyroidism, unspecified: Secondary | ICD-10-CM

## 2021-09-15 LAB — POCT INR: INR: 3.3 — AB (ref 2.0–3.0)

## 2021-09-15 NOTE — Patient Instructions (Signed)
HOLD TODAY ONLY and then Decrease warfarin to 1 tablet daily, except 1/2 a tablet on Monday, Wednesday and Friday. Recheck 2 weeks.  [facility] [phone] ? ?

## 2021-09-16 ENCOUNTER — Other Ambulatory Visit: Payer: Medicare HMO

## 2021-09-16 LAB — CBC WITH DIFFERENTIAL/PLATELET
Basophils Absolute: 0.1 10*3/uL (ref 0.0–0.1)
Basophils Relative: 1 % (ref 0.0–3.0)
Eosinophils Absolute: 0.1 10*3/uL (ref 0.0–0.7)
Eosinophils Relative: 2 % (ref 0.0–5.0)
HCT: 30.7 % — ABNORMAL LOW (ref 36.0–46.0)
Hemoglobin: 9.7 g/dL — ABNORMAL LOW (ref 12.0–15.0)
Lymphocytes Relative: 15.8 % (ref 12.0–46.0)
Lymphs Abs: 1 10*3/uL (ref 0.7–4.0)
MCHC: 31.6 g/dL (ref 30.0–36.0)
MCV: 77 fl — ABNORMAL LOW (ref 78.0–100.0)
Monocytes Absolute: 0.4 10*3/uL (ref 0.1–1.0)
Monocytes Relative: 6.5 % (ref 3.0–12.0)
Neutro Abs: 4.8 10*3/uL (ref 1.4–7.7)
Neutrophils Relative %: 74.7 % (ref 43.0–77.0)
Platelets: 305 10*3/uL (ref 150.0–400.0)
RBC: 3.99 Mil/uL (ref 3.87–5.11)
RDW: 15.4 % (ref 11.5–15.5)
WBC: 6.5 10*3/uL (ref 4.0–10.5)

## 2021-09-16 LAB — LIPID PANEL
Cholesterol: 194 mg/dL (ref 0–200)
HDL: 34.4 mg/dL — ABNORMAL LOW (ref >39.00)
LDL Cholesterol: 143 mg/dL — ABNORMAL HIGH (ref 0–99)
NonHDL: 159.77
Total CHOL/HDL Ratio: 6
Triglycerides: 84 mg/dL (ref 0.0–149.0)
VLDL: 16.8 mg/dL (ref 0.0–40.0)

## 2021-09-16 LAB — COMPREHENSIVE METABOLIC PANEL
ALT: 13 U/L (ref 0–35)
AST: 20 U/L (ref 0–37)
Albumin: 3.7 g/dL (ref 3.5–5.2)
Alkaline Phosphatase: 84 U/L (ref 39–117)
BUN: 10 mg/dL (ref 6–23)
CO2: 27 mEq/L (ref 19–32)
Calcium: 9.3 mg/dL (ref 8.4–10.5)
Chloride: 100 mEq/L (ref 96–112)
Creatinine, Ser: 0.91 mg/dL (ref 0.40–1.20)
GFR: 62.24 mL/min (ref >60.00)
Glucose, Bld: 97 mg/dL (ref 70–99)
Potassium: 3.7 mEq/L (ref 3.5–5.1)
Sodium: 136 mEq/L (ref 135–145)
Total Bilirubin: 0.6 mg/dL (ref 0.2–1.2)
Total Protein: 7.7 g/dL (ref 6.0–8.3)

## 2021-09-16 LAB — TSH: TSH: 0.08 u[IU]/mL — ABNORMAL LOW (ref 0.35–5.50)

## 2021-09-16 LAB — IBC PANEL
Iron: 50 ug/dL (ref 42–145)
Saturation Ratios: 18.5 % — ABNORMAL LOW (ref 20.0–50.0)
TIBC: 270.2 ug/dL (ref 250.0–450.0)
Transferrin: 193 mg/dL — ABNORMAL LOW (ref 212.0–360.0)

## 2021-09-16 LAB — FERRITIN: Ferritin: 613.6 ng/mL — ABNORMAL HIGH (ref 10.0–291.0)

## 2021-09-16 LAB — FOLATE: Folate: >24.2 ng/mL (ref >5.9)

## 2021-09-16 LAB — MICROALBUMIN / CREATININE URINE RATIO
Creatinine,U: 20.6 mg/dL
Microalb Creat Ratio: 7.8 mg/g (ref 0.0–30.0)
Microalb, Ur: 1.6 mg/dL (ref 0.0–1.9)

## 2021-09-16 LAB — VITAMIN D 25 HYDROXY (VIT D DEFICIENCY, FRACTURES): VITD: 48.11 ng/mL (ref 30.00–100.00)

## 2021-09-18 ENCOUNTER — Other Ambulatory Visit (INDEPENDENT_AMBULATORY_CARE_PROVIDER_SITE_OTHER): Payer: Medicare HMO

## 2021-09-18 ENCOUNTER — Other Ambulatory Visit: Payer: Self-pay | Admitting: Family Medicine

## 2021-09-18 DIAGNOSIS — D649 Anemia, unspecified: Secondary | ICD-10-CM

## 2021-09-18 DIAGNOSIS — E039 Hypothyroidism, unspecified: Secondary | ICD-10-CM

## 2021-09-18 LAB — T4, FREE: Free T4: 2.06 ng/dL — ABNORMAL HIGH (ref 0.60–1.60)

## 2021-09-18 NOTE — Progress Notes (Signed)
? ?ADVANCED HF CLINIC NOTE ? ?PCP: Dr Leo Grosser ?Primary Cardiologist: Dr Gwenlyn Found  ?HF MD: Dr [name]  ? ?HPI: ? ?76 y.o. AAF, followed by Dr. Gwenlyn Found, w/ h/o chronic diastolic HF w/ predominant RV dysfunction + PH, chronic AFon Eliquis, severe MR, HTN and RA on Methotrexate s/p bioprosthetic MVR with clipping atrial appendage 5/22 ? ?Had Echo 8/21 showed normal LVEF, 60-65%, w/ mild LVH and G3DD c/w restrictive physiology. RV was mildly reduced at the time w/ severely elevated pulmonary artery pressure at 78 mmHg. LA size was severely dilated, RA mildly dilated. Also w/ findings c/w severe MR. ? ?Developed increased shortness of breath and was set up for TEE/cath to further evaluate mitral valve. Admitted 10/21/2020 for valve work up. TEE with severe MR. LHC with normal cors and RHC with elevated PA pressures/PVR. Optimized with milrinone and on 10/28/20 underwent bioprosthetic MVR and clipping atrial appendage. Milrinone gradually weaned off. Started on GDMT but no bb due to low CO-OX. ? ?Echo 12/16/20 EF 50-55% MVR ok RV mildly down.  ? ?Today she returns for HF follow up with her daughter. Doing well. Feels great. Doing all activities without problem. Very detailed log of BP/HR and weight. BP 110-135/50-60s Weights 131-136. No edema, orthopnea or PND. No problems with meds. On warfarin. No bleeding.  ? ? ?Cardiac Testing ?- Echo (6/22): EF 50-55%, MVR ok, RV mildly down ? ?- R/LHC (5/22): normal cors ? ?- S/P bioprosthetic MVR Clipping atrial appendage (5/22).  ? ?- Echo (5/22): EF 50% bioprosthetic MV (normal) ? ?- TEE (5/22): LVEF normal. RV systolic function is now moderately reduced w/ mild-mod TR, in setting of severe MR. ? ?- Echo (8/21): EF 60-65%, w/ mild LVH and G3DD c/w restrictive physiology. RV was mildly reduced at the time ? ?ROS: All systems negative except as listed in HPI, PMH and Problem List. ? ?SH:  ?Social History  ? ?Socioeconomic History  ? Marital status: Single  ?  Spouse name: Not on file  ?  Number of children: 1  ? Years of education: Not on file  ? Highest education level: Not on file  ?Occupational History  ? Occupation: Retired  ?Tobacco Use  ? Smoking status: Former  ?  Types: Cigarettes  ?  Quit date: 05/16/1969  ?  Years since quitting: 52.3  ? Smokeless tobacco: Former  ?Vaping Use  ? Vaping Use: Never used  ?Substance and Sexual Activity  ? Alcohol use: No  ?  Alcohol/week: 0.0 standard drinks  ? Drug use: No  ? Sexual activity: Not Currently  ?Other Topics Concern  ? Not on file  ?Social History Narrative  ? Lives with grand-daughter and godson  ? 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 Edu: HS  ? ?Social Determinants of Health  ? ?Financial Resource Strain: Low Risk   ? Difficulty of Paying Living Expenses: Not hard at all  ?Food Insecurity: No Food Insecurity  ? Worried About Charity fundraiser in the Last Year: Never true  ? Ran Out of Food in the Last Year: Never true  ?Transportation Needs: No Transportation Needs  ? Lack of Transportation (Medical): No  ? Lack of Transportation (Non-Medical): No  ?Physical Activity: Insufficiently Active  ? Days of Exercise per Week: 3 days  ? Minutes of Exercise per Session: 30 min  ?Stress: No Stress Concern Present  ? Feeling of Stress : Not at all  ?Social Connections: Moderately Integrated  ? Frequency of  Communication with Friends and Family: More than three times a week  ? Frequency of Social Gatherings with Friends and Family: More than three times a week  ? Attends Religious Services: More than 4 times per year  ? Active Member of Clubs or Organizations: Yes  ? Attends Archivist Meetings: More than 4 times per year  ? Marital Status: Divorced  ?Intimate Partner Violence: Not At Risk  ? Fear of Current or Ex-Partner: No  ? Emotionally Abused: No  ? Physically Abused: No  ? Sexually Abused: No  ? ? ?FH:  ?Family History  ?Problem Relation Age of Onset  ? Cancer Mother   ?     ovarian  ?  Sudden death Mother   ? Diabetes Sister   ? Hypertension Sister   ? Cancer Maternal Grandmother   ?     lung  ? Heart disease Maternal Grandmother   ? Hypertension Cousin   ? CAD Neg Hx   ? Stroke Neg Hx   ? Colon cancer Neg Hx   ? Esophageal cancer Neg Hx   ? Rectal cancer Neg Hx   ? Colon polyps Neg Hx   ? Stomach cancer Neg Hx   ? ? ?Past Medical History:  ?Diagnosis Date  ? Anemia   ? Atrial fibrillation ([place])   ? Atrial fibrillation with RVR ([place]) 2014  ? persistent  ? Cataract   ? Chronic diastolic CHF (congestive heart failure) ([place])   ? HTN  ? Chronic diastolic congestive heart failure ([place])   ? COVID-19 virus infection 01/20/2019  ? ESOPHAGEAL STRICTURE 02/19/2005  ? Qualifier: Diagnosis of  By: Amil Amen MD, Benjamine Mola    ? Goiter   ? Heart murmur   ? History of anemia   ? unclear cause, now resolved  ? History of chicken pox   ? History of colon polyps   ? benign  ? HLD (hyperlipidemia)   ? Hypothyroidism   ? Malignant hypertension longstanding  ? Microcytosis   ? Mitral valve regurgitation   ? Osteopenia 12/2013  ? T -2.3 hip  ? Osteoporosis   ? Pulmonary hypertension ([place])   ? Rheumatoid arthritis ([place]) 11/2015  ? +RF, +CCP, ESR 49, synovitis on exam and [place] 11/2015 Trudie Reed)  ? Right lower lobe pneumonia 07/24/2014  ? 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 29 mm Medtronic Mosaic stented porcine bioprosthetic tissue valve  ? Tricuspid regurgitation   ? Vitamin D deficiency   ? ? ?Current Outpatient Medications  ?Medication Sig Dispense Refill  ? acetaminophen (TYLENOL) 325 MG tablet Take 650 mg by mouth every 6 (six) hours as needed (pain).    ? amLODipine (NORVASC) 10 MG tablet Take 1 tablet (10 mg total) by mouth daily. 30 tablet 3  ? amoxicillin (AMOXIL) 500 MG tablet Take 4 tablets (2,000 mg total) by mouth as needed (1 hour prior to dental work). 4 tablet 3  ? aspirin EC 81 MG tablet Take 81 mg by mouth daily. Swallow whole.    ? Calcium Carb-Cholecalciferol (CALCIUM-VITAMIN  D) 600-400 MG-UNIT TABS Take 1 tablet by mouth daily.    ? cholecalciferol (VITAMIN D) 1000 UNITS tablet Take 1,000 Units by mouth daily.    ? ENTRESTO 97-103 MG TAKE 1 TABLET TWICE DAILY 180 tablet 3  ? ferrous sulfate 325 (65 FE) MG tablet Take 1 tablet (325 mg total) by mouth every Monday, Wednesday, and Friday.    ? folic acid (FOLVITE) 1 MG tablet  Take 1 tablet (1 mg total) by mouth daily. 90 tablet 3  ? furosemide (LASIX) 20 MG tablet Take 10 mg by mouth daily.    ? levothyroxine (SYNTHROID) 125 MCG tablet Take 1 tablet (125 mcg total) by mouth daily before breakfast. Twice weekly take only 1/2 tablet.    ? methotrexate (RHEUMATREX) 2.5 MG tablet Take 15 mg by mouth every Friday. Caution:Chemotherapy. Protect from light.    ? omeprazole (PRILOSEC) 20 MG capsule Take 1 capsule (20 mg total) by mouth daily. 30 capsule 11  ? spironolactone (ALDACTONE) 25 MG tablet Take 1 tablet (25 mg total) by mouth daily. 90 tablet 1  ? warfarin (COUMADIN) 4 MG tablet TAKE 1 TO 2 TABLETS EVERY DAY  OR AS DIRECTED  BY  THE  COUMADIN  CLINIC 180 tablet 0  ? ?Current Facility-Administered Medications  ?Medication Dose Route Frequency Provider Last Rate Last Admin  ? 0.9 %  sodium chloride infusion  500 mL Intravenous Once Irene Shipper, MD      ? ?BP (!) 160/90   Pulse 67   Wt 63.5 kg (140 lb)   SpO2 98%   BMI 25.61 kg/m?  ? ?Wt Readings from Last 3 Encounters:  ?09/19/21 63.5 kg (140 lb)  ?09/05/21 61.2 kg (135 lb)  ?06/13/21 65.3 kg (144 lb)  ? ?PHYSICAL EXAM: ?General:  Well appearing. No resp difficulty ?HEENT: normal ?Neck: supple. no JVD. Carotids 2+ bilat; no bruits. No lymphadenopathy or thryomegaly appreciated. ?Cor: PMI nondisplaced. Irregular rate & rhythm. No rubs, gallops or murmurs. ?Lungs: clear ?Abdomen: soft, nontender, nondistended. No hepatosplenomegaly. No bruits or masses. Good bowel sounds. ?Extremities: no cyanosis, clubbing, rash, edema ?Neuro: alert & orientedx3, cranial nerves grossly intact. moves  all 4 extremities w/o difficulty. Affect pleasant ? ? ?ASSESSMENT & PLAN: ? ?1. Severe Rheumatic Mitral Valve Regurgitation --> 10/28/20 S/P MVR ?- Primary MR. LVEF normal 60-65%  ?- LHC w/ normal coronaries

## 2021-09-19 ENCOUNTER — Encounter (HOSPITAL_COMMUNITY): Payer: Self-pay | Admitting: Internal Medicine

## 2021-09-19 ENCOUNTER — Other Ambulatory Visit: Payer: Self-pay | Admitting: Family Medicine

## 2021-09-19 ENCOUNTER — Ambulatory Visit (HOSPITAL_COMMUNITY)
Admission: RE | Admit: 2021-09-19 | Discharge: 2021-09-19 | Disposition: A | Discharge status: Home / Self Care | Payer: Medicare HMO | Source: Ambulatory Visit | Attending: Internal Medicine | Admitting: Internal Medicine

## 2021-09-19 VITALS — BP 160/90 | HR 67 | Wt 140.0 lb

## 2021-09-19 DIAGNOSIS — I482 Chronic atrial fibrillation, unspecified: Secondary | ICD-10-CM | POA: Insufficient documentation

## 2021-09-19 DIAGNOSIS — I110 Hypertensive heart disease with heart failure: Secondary | ICD-10-CM | POA: Insufficient documentation

## 2021-09-19 DIAGNOSIS — I5032 Chronic diastolic (congestive) heart failure: Secondary | ICD-10-CM | POA: Diagnosis not present

## 2021-09-19 DIAGNOSIS — Z79899 Other long term (current) drug therapy: Secondary | ICD-10-CM | POA: Diagnosis not present

## 2021-09-19 DIAGNOSIS — I272 Pulmonary hypertension, unspecified: Secondary | ICD-10-CM | POA: Insufficient documentation

## 2021-09-19 DIAGNOSIS — I051 Rheumatic mitral insufficiency: Secondary | ICD-10-CM | POA: Diagnosis not present

## 2021-09-19 DIAGNOSIS — Z953 Presence of xenogenic heart valve: Secondary | ICD-10-CM

## 2021-09-19 DIAGNOSIS — D509 Iron deficiency anemia, unspecified: Secondary | ICD-10-CM | POA: Insufficient documentation

## 2021-09-19 DIAGNOSIS — M069 Rheumatoid arthritis, unspecified: Secondary | ICD-10-CM | POA: Insufficient documentation

## 2021-09-19 DIAGNOSIS — I10 Essential (primary) hypertension: Secondary | ICD-10-CM | POA: Diagnosis not present

## 2021-09-19 DIAGNOSIS — Z8249 Family history of ischemic heart disease and other diseases of the circulatory system: Secondary | ICD-10-CM | POA: Diagnosis not present

## 2021-09-19 DIAGNOSIS — D500 Iron deficiency anemia secondary to blood loss (chronic): Secondary | ICD-10-CM | POA: Diagnosis not present

## 2021-09-19 DIAGNOSIS — Z7901 Long term (current) use of anticoagulants: Secondary | ICD-10-CM | POA: Insufficient documentation

## 2021-09-19 DIAGNOSIS — I4819 Other persistent atrial fibrillation: Secondary | ICD-10-CM | POA: Diagnosis not present

## 2021-09-19 LAB — IRON AND TIBC
Iron: 114 ug/dL (ref 28–170)
Saturation Ratios: 34 % — ABNORMAL HIGH (ref 10.4–31.8)
TIBC: 335 ug/dL (ref 250–450)
UIBC: 221 ug/dL

## 2021-09-19 LAB — HAPTOGLOBIN: Haptoglobin: 347 mg/dL — ABNORMAL HIGH (ref 43–212)

## 2021-09-19 LAB — FERRITIN: Ferritin: 490 ng/mL — ABNORMAL HIGH (ref 11–307)

## 2021-09-19 NOTE — Patient Instructions (Signed)
It was great to see you today! ?No medication changes are needed at this time. ? ? ?Labs today ?We will only contact you if something comes back abnormal or we need to make some changes. ?Otherwise no news is good news! ? ?Your physician wants you to follow-up in: 9 months with Dr [name]. You will receive a reminder letter in the mail two months in advance. If you don't receive a letter, please call our office to schedule the follow-up appointment. ? ?Your physician has requested that you have an echocardiogram. Echocardiography is a painless test that uses sound waves to create images of your heart. It provides your doctor with information about the size and shape of your heart and how well your heart?s chambers and valves are working. This procedure takes approximately one hour. There are no restrictions for this procedure. ? ? ?Do the following things EVERYDAY: ?Weigh yourself in the morning before breakfast. Write it down and keep it in a log. ?Take your medicines as prescribed ?Eat low salt foods--Limit salt (sodium) to 2000 mg per day.  ?Stay as active as you can everyday ?Limit all fluids for the day to less than 2 liters ? ?At the [facility], you and your health needs are our priority. As part of our continuing mission to provide you with exceptional heart care, we have created designated Provider Care Teams. These Care Teams include your primary Cardiologist (physician) and Advanced Practice Providers (APPs- Physician Assistants and Nurse Practitioners) who all work together to provide you with the care you need, when you need it.  ? ?You may see any of the following providers on your designated Care Team at your next follow up: ?Dr Glori Bickers ?Dr Loralie Champagne ?Darrick Grinder, NP ?Lyda Jester, PA ?Jessica Milford,NP ?Marlyce Huge, PA ?Audry Riles, PharmD ? ? ?Please be sure to bring in all your medications bottles to every appointment.  ? ?If you have any questions or concerns  before your next appointment please send [place] a message through [place] or call our office at [phone].   ? ?TO LEAVE A MESSAGE FOR THE NURSE SELECT OPTION 2, PLEASE LEAVE A MESSAGE INCLUDING: ?YOUR NAME ?DATE OF BIRTH ?CALL BACK NUMBER ?REASON FOR CALL**this is important as we prioritize the call backs ? ?YOU WILL RECEIVE A CALL BACK THE SAME DAY AS LONG AS YOU CALL BEFORE 4:00 PM ? ? ?

## 2021-09-19 NOTE — Addendum Note (Signed)
Encounter addended by: Kerry Dory, CMA on: 09/19/2021 11:00 AM ? Actions taken: Visit diagnoses modified, Order list changed, Diagnosis association updated, Charge Capture section accepted, Clinical Note Signed

## 2021-09-24 ENCOUNTER — Telehealth: Payer: Self-pay | Admitting: Family Medicine

## 2021-09-24 ENCOUNTER — Encounter: Payer: Self-pay | Admitting: Family Medicine

## 2021-09-24 ENCOUNTER — Ambulatory Visit (INDEPENDENT_AMBULATORY_CARE_PROVIDER_SITE_OTHER): Payer: Medicare HMO | Admitting: Family Medicine

## 2021-09-24 VITALS — BP 156/70 | HR 59 | Temp 97.3°F | Ht 62.25 in | Wt 136.1 lb

## 2021-09-24 DIAGNOSIS — I4819 Other persistent atrial fibrillation: Secondary | ICD-10-CM | POA: Diagnosis not present

## 2021-09-24 DIAGNOSIS — I5032 Chronic diastolic (congestive) heart failure: Secondary | ICD-10-CM

## 2021-09-24 DIAGNOSIS — Z0000 Encounter for general adult medical examination without abnormal findings: Secondary | ICD-10-CM

## 2021-09-24 DIAGNOSIS — E559 Vitamin D deficiency, unspecified: Secondary | ICD-10-CM

## 2021-09-24 DIAGNOSIS — I10 Essential (primary) hypertension: Secondary | ICD-10-CM | POA: Diagnosis not present

## 2021-09-24 DIAGNOSIS — Z7189 Other specified counseling: Secondary | ICD-10-CM | POA: Diagnosis not present

## 2021-09-24 DIAGNOSIS — R634 Abnormal weight loss: Secondary | ICD-10-CM

## 2021-09-24 DIAGNOSIS — Z7901 Long term (current) use of anticoagulants: Secondary | ICD-10-CM

## 2021-09-24 DIAGNOSIS — I051 Rheumatic mitral insufficiency: Secondary | ICD-10-CM

## 2021-09-24 DIAGNOSIS — Z953 Presence of xenogenic heart valve: Secondary | ICD-10-CM

## 2021-09-24 DIAGNOSIS — D509 Iron deficiency anemia, unspecified: Secondary | ICD-10-CM | POA: Diagnosis not present

## 2021-09-24 DIAGNOSIS — M069 Rheumatoid arthritis, unspecified: Secondary | ICD-10-CM

## 2021-09-24 DIAGNOSIS — E039 Hypothyroidism, unspecified: Secondary | ICD-10-CM | POA: Diagnosis not present

## 2021-09-24 DIAGNOSIS — M85851 Other specified disorders of bone density and structure, right thigh: Secondary | ICD-10-CM

## 2021-09-24 MED ORDER — LEVOTHYROXINE SODIUM 100 MCG PO TABS: 100.0000 ug | ORAL_TABLET | Freq: Every day | ORAL | 3 refills | Status: DC

## 2021-09-24 MED ORDER — SPIRONOLACTONE 25 MG PO TABS: 25.0000 mg | ORAL_TABLET | Freq: Every day | ORAL | 3 refills | Status: DC

## 2021-09-24 NOTE — Assessment & Plan Note (Signed)
Progressive. Initially attributed to iron deficiency however ongoing despite oral iron MWF. Iron levels have responded to oral iron replacement.  ?Completed reassuring GI evaluation (colonoscopy). ?Transferrin levels are dropping, ferritin is high.  ?Haptoglobin levels not consistent with hemolytic anemia.  ?Will refer to heme for input.  ?

## 2021-09-24 NOTE — Assessment & Plan Note (Addendum)
Appreciate rheum care [facility]) - continues MTX with folic acid supplementation.  ?

## 2021-09-24 NOTE — Assessment & Plan Note (Signed)
Appreciate cards care - followed by cardiology coumadin clinic.  ?

## 2021-09-24 NOTE — Telephone Encounter (Signed)
Spoke with pt informing her per Dr. Darnell Level, drop levothyroxine to 1/2 tab twice weekly, other days continue 1 tab daily.  Then start new dose and instructions once she receives shipment from [facility] order pharmacy.  Pt verbalizes understanding and expresses her thanks.  ?

## 2021-09-24 NOTE — Assessment & Plan Note (Signed)
Preventative protocols reviewed and updated unless pt declined. Discussed healthy diet and lifestyle.  

## 2021-09-24 NOTE — Assessment & Plan Note (Signed)
Appreciate cards care. Now seeing advanced CHF clinic.  ?

## 2021-09-24 NOTE — Assessment & Plan Note (Signed)
Chronic, adequate on current regimen - home readings largely well controlled, anticipate component of white coat hypertension ?

## 2021-09-24 NOTE — Assessment & Plan Note (Signed)
S/p MV replacement with bioprosthetic valve 10/2020 ?

## 2021-09-24 NOTE — Assessment & Plan Note (Signed)
TFTs consistent with overtreated hypothyroidism - drop levothyroxine to 188mg daily, recheck levels in 6 wks.  ?

## 2021-09-24 NOTE — Assessment & Plan Note (Addendum)
Needs ppx abx.  ?

## 2021-09-24 NOTE — Assessment & Plan Note (Signed)
Stable period on daily replacement.  ?

## 2021-09-24 NOTE — Assessment & Plan Note (Signed)
Weight loss persists but overall stable.  ?Goal weight closer to 140s ?

## 2021-09-24 NOTE — Progress Notes (Signed)
? ? Patient ID: Joanna Hood, female    DOB: 10/15/1945, 76 y.o.   MRN: [id number] ? ?This visit was conducted in person. ? ?BP (!) 156/70 (BP Location: Right Arm, Cuff Size: Normal)   Pulse (!) 59   Temp (!) 97.3 ?F (36.3 ?C) (Temporal)   Ht 5' 2.25" (1.581 m)   Wt 136 lb 2 oz (61.7 kg)   SpO2 100%   BMI 24.70 kg/m?   ? ?CC: AMW ?Subjective:  ? ?HPI: ?Joanna Hood is a 76 y.o. female presenting on 09/24/2021 for Annual Exam [facility] prt 2. ) ? ? ?Saw health advisor last week for medicare wellness visit. Note reviewed. Cognitive assessment not performed.  ? ?No results found.  ?Flowsheet Row Clinical Support from 09/05/2021 in [place] [facility] [place]  ?PHQ-2 Total Score 0  ? ?  ?  ? ?  09/05/2021  ?  2:07 PM 01/28/2021  ?  9:56 AM 09/04/2020  ?  2:51 PM 06/28/2020  ?  9:40 AM 06/02/2019  ? 12:01 PM  ?Fall Risk   ?Falls in the past year? 0 0 0 0 0  ?Number falls in past yr: 0  0 0 0  ?Injury with Fall? 0  0 0 0  ?Risk for fall due to : No Fall Risks Other (Comment) Medication side effect  Medication side effect  ?Risk for fall due to: Comment  Single leg stand less than 5 seconds.     ?Follow up Falls prevention discussed Falls evaluation completed Falls evaluation completed;Falls prevention discussed Falls evaluation completed Falls evaluation completed;Falls prevention discussed  ?  ?S/p bioprosthetic MVR with clipping of atrial appendage 2022 for severe MVR, h/o grade 3 diastolic dysfunction with severe LA enlargement. Persistent afib now on coumadin. Continues amlodipine '10mg'$ , lasix '10mg'$  daily, entresto 97/103 bid and spironolactone '25mg'$  daily. Saw CHF clinic last week - good home BP readings noted.  ? ?Rec SBE ppx.  ? ?HTN - home BP readings largely well controlled - 119/63 this morning. No HA, vision changes, CP/tightness, SOB, leg swelling.  ? ?RA - on MTX '15mg'$  weekly sees rheum Q6 mo Trudie Reed) - upcoming appt next week. Symptoms very well controlled.  ? ?Cough developed 1 month ago - worse  at night time - finally resolved on its own. No dyspnea with it, fever, wheezing.  ? ?She continues oral iron MWF - denies difficulty with constipation.  ? ?Preventative:   ?COLONOSCOPY 11/2015 - TA, int hem, rpt 5 yrs Henrene Pastor) - will be due this summer.  ?COLONOSCOPY 05/2021 - WNL no repeat recommended Henrene Pastor)  ?Well woman exam - partial hysterectomy 1970, ovaries remain. All normal pap smears. No pelvic pain/pressure  ?[facility]  ?DEXA 12/2013 - T-2.3 osteopenia  ?DEXA 08/2019 - T -2.0 R hip, 0.7 spine - reviewed calcium/vitamin D daily and regular walking. Upcoming rpt DEXA 02/2022.  ?Lung cancer screen - not eligible  ?Flu shot yearly  ?[place] 07/2019, 08/2019, booster 03/2020  ?Pneumovax 04/2013, [id number] 2016 ?Tdap 2017, 2019 ?Shingrix - not interested  ?Advanced directives: HCPOA would be daughter - has spoken with her. Packet provided today. Encouraged she work on this.  ?Seat belt use discussed ?Sunscreen use discussed. No changing moles on skin.  ?Remote smoker  ?Alcohol - none  ?Dentist - due  ?Eye exam - yearly (05/2021 [facility] on [address]) ?Bowel - no constipation ?Bladder - no incontinence ? 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   ? ?Relevant past medical, surgical, family and social history reviewed and updated as indicated. Interim medical history since our last visit reviewed. ?Allergies and medications reviewed and updated. ?Outpatient Medications Prior to Visit  ?Medication Sig Dispense Refill  ? acetaminophen (TYLENOL) 325 MG tablet Take 650 mg by mouth every 6 (six) hours as needed (pain).    ? amLODipine (NORVASC) 10 MG tablet Take 1 tablet (10 mg total) by mouth daily. 30 tablet 3  ? amoxicillin (AMOXIL) 500 MG tablet Take 4 tablets (2,000 mg total) by mouth as needed (1 hour  prior to dental work). 4 tablet 3  ? aspirin EC 81 MG tablet Take 81 mg by mouth daily. Swallow whole.    ? Calcium Carb-Cholecalciferol (CALCIUM-VITAMIN D) 600-400 MG-UNIT TABS Take 1 tablet by mouth daily.    ? cholecalciferol (VITAMIN D) 1000 UNITS tablet Take 1,000 Units by mouth daily.    ? ENTRESTO 97-103 MG TAKE 1 TABLET TWICE DAILY 180 tablet 3  ? ferrous sulfate 325 (65 FE) MG tablet Take 1 tablet (325 mg total) by mouth every Monday, Wednesday, and Friday.    ? folic acid (FOLVITE) 1 MG tablet Take 1 tablet (1 mg total) by mouth daily. 90 tablet 3  ? furosemide (LASIX) 20 MG tablet Take 10 mg by mouth daily.    ? methotrexate (RHEUMATREX) 2.5 MG tablet Take 15 mg by mouth every Friday. Caution:Chemotherapy. Protect from light.    ? omeprazole (PRILOSEC) 20 MG capsule Take 1 capsule (20 mg total) by mouth daily. 30 capsule 11  ? warfarin (COUMADIN) 4 MG tablet TAKE 1 TO 2 TABLETS EVERY DAY  OR AS DIRECTED  BY  THE  COUMADIN  CLINIC 180 tablet 0  ? levothyroxine (SYNTHROID) 125 MCG tablet Take 1 tablet (125 mcg total) by mouth daily before breakfast. Twice weekly take only 1/2 tablet.    ? spironolactone (ALDACTONE) 25 MG tablet Take 1 tablet (25 mg total) by mouth daily. 90 tablet 1  ? ?Facility-Administered Medications Prior to Visit  ?Medication Dose Route Frequency Provider Last Rate Last Admin  ? 0.9 %  sodium chloride infusion  500 mL Intravenous Once Irene Shipper, MD      ?  ? ?Per HPI unless specifically indicated in ROS section below ?Review of Systems  ?Constitutional:  Negative for activity change, appetite change, chills, fatigue, fever and unexpected weight change.  ?HENT:  Negative for hearing loss.   ?Eyes:  Negative for visual disturbance.  ?Respiratory:  Positive for cough. Negative for chest tightness, shortness of breath and wheezing.   ?Cardiovascular:  Negative for chest pain, palpitations and leg swelling.  ?Gastrointestinal:  Negative for abdominal distention, abdominal pain, blood in  stool, constipation, diarrhea, nausea and vomiting.  ?Genitourinary:  Negative for difficulty urinating and hematuria.  ?Musculoskeletal:  Negative for arthralgias, myalgias and neck pain.  ?Skin:  Negative for rash.  ?Neurological:  Negative for dizziness, seizures, syncope and headaches.  ?Hematological:  Negative for adenopathy. Does not bruise/bleed easily.  ?Psychiatric/Behavioral:  Negative for dysphoric mood. The patient is not nervous/anxious.   ? ?Objective:  ?BP (!) 156/70 (BP Location: Right Arm, Cuff Size: Normal)   Pulse (!) 59   Temp (!) 97.3 ?F (36.3 ?C) (Temporal)   Ht 5' 2.25" (1.581 m)   Wt 136 lb 2 oz (61.7 kg)   SpO2 100%  BMI 24.70 kg/m?   ?Wt Readings from Last 3 Encounters:  ?09/24/21 136 lb 2 oz (61.7 kg)  ?09/19/21 140 lb (63.5 kg)  ?09/05/21 135 lb (61.2 kg)  ?  ?  ?Physical Exam ?Vitals and nursing note reviewed.  ?Constitutional:   ?   Appearance: Normal appearance. She is not ill-appearing.  ?HENT:  ?   Head: Normocephalic and atraumatic.  ?   Right Ear: Tympanic membrane, ear canal and external ear normal. There is no impacted cerumen.  ?   Left Ear: Tympanic membrane, ear canal and external ear normal. There is no impacted cerumen.  ?Eyes:  ?   General:     ?   Right eye: No discharge.     ?   Left eye: No discharge.  ?   Extraocular Movements: Extraocular movements intact.  ?   Conjunctiva/sclera: Conjunctivae normal.  ?   Pupils: Pupils are equal, round, and reactive to light.  ?Neck:  ?   Thyroid: No thyroid mass or thyromegaly.  ?   Vascular: Carotid bruit (L sided ?referred from cardiac murmur) present.  ?Cardiovascular:  ?   Rate and Rhythm: Normal rate and regular rhythm.  ?   Pulses: Normal pulses.  ?   Heart sounds: Murmur (3/6 systolic) heard.  ?Pulmonary:  ?   Effort: Pulmonary effort is normal. No respiratory distress.  ?   Breath sounds: Normal breath sounds. No wheezing, rhonchi or rales.  ?Abdominal:  ?   General: Bowel sounds are normal. There is no distension.   ?   Palpations: Abdomen is soft. There is no mass.  ?   Tenderness: There is no abdominal tenderness. There is no guarding or rebound.  ?   Hernia: No hernia is present.  ?Musculoskeletal:  ?   Cervical back: No

## 2021-09-24 NOTE — Telephone Encounter (Signed)
Pt has called wondering if in the meantime of her prescription coming through [facility], should she continue the medication she has or does a prescription need to be sent to [facility] (what should she do in the mean time).. please advise  ?

## 2021-09-24 NOTE — Patient Instructions (Addendum)
Consider shingrix vaccine.  ?Thyroid levels are too high - drop levothyroxine to 188mg daily - new dose at pharmacy. Schedule lab visit in 6 weeks to recheck thyroid levels.  ?We will be in touch with plan for iron replacement - I may refer you to blood doctor for evaluation.  ?Good to see you today, call [place] questions. ?Return as needed or in 6 months for follow up visit.  ? ?Health Maintenance After Age 75?After age 75 you are at a higher risk for certain long-term diseases and infections as well as injuries from falls. Falls are a major cause of broken bones and head injuries in people who are older than age 75 Getting regular preventive care can help to keep you healthy and well. Preventive care includes getting regular testing and making lifestyle changes as recommended by your health care provider. Talk with your health care provider about: ?Which screenings and tests you should have. A screening is a test that checks for a disease when you have no symptoms. ?A diet and exercise plan that is right for you. ?What should I know about screenings and tests to prevent falls? ?Screening and testing are the best ways to find a health problem early. Early diagnosis and treatment give you the best chance of managing medical conditions that are common after age 75 Certain conditions and lifestyle choices may make you more likely to have a fall. Your health care provider may recommend: ?Regular vision checks. Poor vision and conditions such as cataracts can make you more likely to have a fall. If you wear glasses, make sure to get your prescription updated if your vision changes. ?Medicine review. Work with your health care provider to regularly review all of the medicines you are taking, including over-the-counter medicines. Ask your health care provider about any side effects that may make you more likely to have a fall. Tell your health care provider if any medicines that you take make you feel dizzy or  sleepy. ?Strength and balance checks. Your health care provider may recommend certain tests to check your strength and balance while standing, walking, or changing positions. ?Foot health exam. Foot pain and numbness, as well as not wearing proper footwear, can make you more likely to have a fall. ?Screenings, including: ?Osteoporosis screening. Osteoporosis is a condition that causes the bones to get weaker and break more easily. ?Blood pressure screening. Blood pressure changes and medicines to control blood pressure can make you feel dizzy. ?Depression screening. You may be more likely to have a fall if you have a fear of falling, feel depressed, or feel unable to do activities that you used to do. ?Alcohol use screening. Using too much alcohol can affect your balance and may make you more likely to have a fall. ?Follow these instructions at home: ?Lifestyle ?Do not drink alcohol if: ?Your health care provider tells you not to drink. ?If you drink alcohol: ?Limit how much you have to: ?0-1 drink a day for women. ?0-2 drinks a day for men. ?Know how much alcohol is in your drink. In the [place] one drink equals one 12 oz bottle of beer (355 mL), one 5 oz glass of wine (148 mL), or one 1? oz glass of hard liquor (44 mL). ?Do not use any products that contain nicotine or tobacco. These products include cigarettes, chewing tobacco, and vaping devices, such as e-cigarettes. If you need help quitting, ask your health care provider. ?Activity ? ?Follow a regular exercise program to stay  fit. This will help you maintain your balance. Ask your health care provider what types of exercise are appropriate for you. ?If you need a cane or walker, use it as recommended by your health care provider. ?Wear supportive shoes that have nonskid soles. ?Safety ? ?Remove any tripping hazards, such as rugs, cords, and clutter. ?Install safety equipment such as grab bars in bathrooms and safety rails on stairs. ?Keep rooms and walkways  well-lit. ?General instructions ?Talk with your health care provider about your risks for falling. Tell your health care provider if: ?You fall. Be sure to tell your health care provider about all falls, even ones that seem minor. ?You feel dizzy, tiredness (fatigue), or off-balance. ?Take over-the-counter and prescription medicines only as told by your health care provider. These include supplements. ?Eat a healthy diet and maintain a healthy weight. A healthy diet includes low-fat dairy products, low-fat (lean) meats, and fiber from whole grains, beans, and lots of fruits and vegetables. ?Stay current with your vaccines. ?Schedule regular health, dental, and eye exams. ?Summary ?Having a healthy lifestyle and getting preventive care can help to protect your health and wellness after age 30. ?Screening and testing are the best way to find a health problem early and help you avoid having a fall. Early diagnosis and treatment give you the best chance for managing medical conditions that are more common for people who are older than age 47. ?Falls are a major cause of broken bones and head injuries in people who are older than age 39. Take precautions to prevent a fall at home. ?Work with your health care provider to learn what changes you can make to improve your health and wellness and to prevent falls. ?This information is not intended to replace advice given to you by your health care provider. Make sure you discuss any questions you have with your health care provider. ?Document Revised: 10/28/2020 Document Reviewed: 10/28/2020 ?Elsevier Patient Education ? [place]. ? ?

## 2021-09-24 NOTE — Assessment & Plan Note (Addendum)
Advanced directives: HCPOA would be daughter - has spoken with her. Packet provided today. Encouraged she work on this.  ?

## 2021-09-24 NOTE — Assessment & Plan Note (Signed)
Discussed calcium/vit D and regular weight bearing exercise. ?She has upcoming DEXA planned for later this year.  ?

## 2021-09-25 ENCOUNTER — Telehealth: Payer: Self-pay | Admitting: Hematology and Oncology

## 2021-09-25 NOTE — Telephone Encounter (Signed)
Scheduled appt per 4/5 referral. Pt is aware of appt date and time. Pt is aware to arrive 15 mins prior to appt time and to bring and updated insurance card. Pt is aware of appt location.   ?

## 2021-09-29 ENCOUNTER — Ambulatory Visit (INDEPENDENT_AMBULATORY_CARE_PROVIDER_SITE_OTHER): Payer: Medicare HMO

## 2021-09-29 DIAGNOSIS — Z79899 Other long term (current) drug therapy: Secondary | ICD-10-CM | POA: Diagnosis not present

## 2021-09-29 DIAGNOSIS — Z1589 Genetic susceptibility to other disease: Secondary | ICD-10-CM | POA: Diagnosis not present

## 2021-09-29 DIAGNOSIS — Z7901 Long term (current) use of anticoagulants: Secondary | ICD-10-CM

## 2021-09-29 DIAGNOSIS — I4819 Other persistent atrial fibrillation: Secondary | ICD-10-CM

## 2021-09-29 DIAGNOSIS — M0579 Rheumatoid arthritis with rheumatoid factor of multiple sites without organ or systems involvement: Secondary | ICD-10-CM | POA: Diagnosis not present

## 2021-09-29 DIAGNOSIS — Z6823 Body mass index (BMI) 23.0-23.9, adult: Secondary | ICD-10-CM | POA: Diagnosis not present

## 2021-09-29 DIAGNOSIS — M1991 Primary osteoarthritis, unspecified site: Secondary | ICD-10-CM | POA: Diagnosis not present

## 2021-09-29 DIAGNOSIS — Z953 Presence of xenogenic heart valve: Secondary | ICD-10-CM | POA: Diagnosis not present

## 2021-09-29 LAB — POCT INR: INR: 2 (ref 2.0–3.0)

## 2021-09-29 NOTE — Patient Instructions (Signed)
Continue 1 tablet daily, except 1/2 a tablet on Monday, Wednesday and Friday. Recheck 4 weeks.  Coumadin Clinic [phone] ? ?

## 2021-10-10 DIAGNOSIS — M0579 Rheumatoid arthritis with rheumatoid factor of multiple sites without organ or systems involvement: Secondary | ICD-10-CM | POA: Diagnosis not present

## 2021-10-10 DIAGNOSIS — Z79899 Other long term (current) drug therapy: Secondary | ICD-10-CM | POA: Diagnosis not present

## 2021-10-16 ENCOUNTER — Inpatient Hospital Stay: Payer: Medicare HMO

## 2021-10-16 ENCOUNTER — Inpatient Hospital Stay: Payer: Medicare HMO | Attending: Hematology and Oncology | Admitting: Hematology and Oncology

## 2021-10-16 ENCOUNTER — Other Ambulatory Visit: Payer: Self-pay

## 2021-10-16 VITALS — BP 165/76 | HR 67 | Temp 98.2°F | Resp 20 | Wt 137.5 lb

## 2021-10-16 DIAGNOSIS — M069 Rheumatoid arthritis, unspecified: Secondary | ICD-10-CM | POA: Diagnosis not present

## 2021-10-16 DIAGNOSIS — Z8041 Family history of malignant neoplasm of ovary: Secondary | ICD-10-CM | POA: Diagnosis not present

## 2021-10-16 DIAGNOSIS — E785 Hyperlipidemia, unspecified: Secondary | ICD-10-CM | POA: Insufficient documentation

## 2021-10-16 DIAGNOSIS — I4891 Unspecified atrial fibrillation: Secondary | ICD-10-CM | POA: Insufficient documentation

## 2021-10-16 DIAGNOSIS — I272 Pulmonary hypertension, unspecified: Secondary | ICD-10-CM | POA: Diagnosis not present

## 2021-10-16 DIAGNOSIS — Z79899 Other long term (current) drug therapy: Secondary | ICD-10-CM | POA: Insufficient documentation

## 2021-10-16 DIAGNOSIS — Z87891 Personal history of nicotine dependence: Secondary | ICD-10-CM | POA: Insufficient documentation

## 2021-10-16 DIAGNOSIS — I110 Hypertensive heart disease with heart failure: Secondary | ICD-10-CM

## 2021-10-16 DIAGNOSIS — D509 Iron deficiency anemia, unspecified: Secondary | ICD-10-CM | POA: Diagnosis not present

## 2021-10-16 DIAGNOSIS — I5032 Chronic diastolic (congestive) heart failure: Secondary | ICD-10-CM | POA: Diagnosis not present

## 2021-10-16 DIAGNOSIS — E039 Hypothyroidism, unspecified: Secondary | ICD-10-CM | POA: Diagnosis not present

## 2021-10-16 DIAGNOSIS — D638 Anemia in other chronic diseases classified elsewhere: Secondary | ICD-10-CM

## 2021-10-16 DIAGNOSIS — Z7982 Long term (current) use of aspirin: Secondary | ICD-10-CM | POA: Diagnosis not present

## 2021-10-16 DIAGNOSIS — Z801 Family history of malignant neoplasm of trachea, bronchus and lung: Secondary | ICD-10-CM

## 2021-10-16 DIAGNOSIS — Z7901 Long term (current) use of anticoagulants: Secondary | ICD-10-CM | POA: Insufficient documentation

## 2021-10-16 LAB — CMP (CANCER CENTER ONLY)
ALT: 11 U/L (ref 0–44)
AST: 17 U/L (ref 15–41)
Albumin: 4.2 g/dL (ref 3.5–5.0)
Alkaline Phosphatase: 101 U/L (ref 38–126)
Anion gap: 8 (ref 5–15)
BUN: 24 mg/dL — ABNORMAL HIGH (ref 8–23)
CO2: 27 mmol/L (ref 22–32)
Calcium: 9.9 mg/dL (ref 8.9–10.3)
Chloride: 103 mmol/L (ref 98–111)
Creatinine: 1.04 mg/dL — ABNORMAL HIGH (ref 0.44–1.00)
GFR, Estimated: 56 mL/min — ABNORMAL LOW (ref >60)
Glucose, Bld: 102 mg/dL — ABNORMAL HIGH (ref 70–99)
Potassium: 3.9 mmol/L (ref 3.5–5.1)
Sodium: 138 mmol/L (ref 135–145)
Total Bilirubin: 0.6 mg/dL (ref 0.3–1.2)
Total Protein: 8.3 g/dL — ABNORMAL HIGH (ref 6.5–8.1)

## 2021-10-16 LAB — RETIC PANEL
Immature Retic Fract: 6 % (ref 2.3–15.9)
RBC.: 4.48 MIL/uL (ref 3.87–5.11)
Retic Count, Absolute: 46.1 10*3/uL (ref 19.0–186.0)
Retic Ct Pct: 1 % (ref 0.4–3.1)
Reticulocyte Hemoglobin: 29.8 pg (ref >27.9)

## 2021-10-16 LAB — CBC WITH DIFFERENTIAL (CANCER CENTER ONLY)
Abs Immature Granulocytes: 0.01 10*3/uL (ref 0.00–0.07)
Basophils Absolute: 0.1 10*3/uL (ref 0.0–0.1)
Basophils Relative: 1 %
Eosinophils Absolute: 0.1 10*3/uL (ref 0.0–0.5)
Eosinophils Relative: 1 %
HCT: 35 % — ABNORMAL LOW (ref 36.0–46.0)
Hemoglobin: 11.3 g/dL — ABNORMAL LOW (ref 12.0–15.0)
Immature Granulocytes: 0 %
Lymphocytes Relative: 21 %
Lymphs Abs: 0.9 10*3/uL (ref 0.7–4.0)
MCH: 25.1 pg — ABNORMAL LOW (ref 26.0–34.0)
MCHC: 32.3 g/dL (ref 30.0–36.0)
MCV: 77.6 fL — ABNORMAL LOW (ref 80.0–100.0)
Monocytes Absolute: 0.3 10*3/uL (ref 0.1–1.0)
Monocytes Relative: 8 %
Neutro Abs: 2.9 10*3/uL (ref 1.7–7.7)
Neutrophils Relative %: 69 %
Platelet Count: 209 10*3/uL (ref 150–400)
RBC: 4.51 MIL/uL (ref 3.87–5.11)
RDW: 16.1 % — ABNORMAL HIGH (ref 11.5–15.5)
WBC Count: 4.2 10*3/uL (ref 4.0–10.5)
nRBC: 0 % (ref 0.0–0.2)

## 2021-10-16 LAB — VITAMIN B12: Vitamin B-12: 424 pg/mL (ref 180–914)

## 2021-10-16 LAB — FOLATE: Folate: 16.8 ng/mL (ref >5.9)

## 2021-10-16 LAB — LACTATE DEHYDROGENASE: LDH: 197 U/L — ABNORMAL HIGH (ref 98–192)

## 2021-10-16 NOTE — Progress Notes (Signed)
?[place] ?[phone]   [phone] ? ?INITIAL CONSULT NOTE ? ?Patient Care Team: ?Ria Bush, MD as PCP - General (Family Medicine) ?Lorretta Harp, MD as Consulting Physician (Cardiology) ?Gavin Pound, MD as Consulting Physician (Rheumatology) ?Shirl Harris, OD as Consulting Physician (Optometry) ?Debbora Dus, [facility] as Pharmacist (Pharmacist) ? ?Hematological/Oncological History ?# Microcytic Anemia ?06/09/2021: WBC 3.9, Hgb 10.8, MCV 77.8, Plt 164 ?09/15/2021: WBC 6.5, Hgb 9.7, MCV 77, Plt 305 ?10/16/2021: establish care with Dr. Lorenso Courier ? ?CHIEF COMPLAINTS/PURPOSE OF CONSULTATION:  ?"Microcytic Anemia " ? ?HISTORY OF PRESENTING ILLNESS:  ?Joanna Hood 76 y.o. female with medical history significant for atrial fibrillation, diastolic congestive heart failure, hyperlipidemia, hypothyroidism, rheumatoid arthritis, and pulmonary hypertension who presents for evaluation of a microcytic anemia. ? ?On review of the previous records Joanna Hood had labs drawn on 06/09/2021 which showed white blood cell count 3.9, hemoglobin 10.8, MCV 77.8, platelets of 164.  More recently on 09/15/2021 patient went for blood count 6.5, hemoglobin 9.7, MCV 77, platelets 305.  Due to concern for this patient's microcytic anemia she was referred to hematology for further evaluation and management. ? ?On exam today Joanna Hood reports that she was going over the labs with her PCP and was noted to have anemia.  She reports that this is the first she has heard of this.  She notes that her iron intake has been good she is not sure why her hemoglobin has been low.  She notes that she does take iron pills on Monday Wednesday and Friday because they do cause diarrhea.  She reports that her energy levels have been good and her level she currently ranks as an 8 out of 10.  She currently has a history of RA which she is taking methotrexate as well as levothyroxine for thyroid disease.  She  reports that she is currently on a low-sodium diet for her blood pressure and atrial fibrillation.  She is not having any issues with bleeding, bruising, or dark stools.  She did have a colonoscopy back in 2022. ? ?On further discussion her mother has history of anemia and her sister underwent a hip replacement.  She reports she has 1 healthy child.  Her maternal grandmother died of lung cancer.  She reports that she is a former smoker having socially smoked in her teens modestly, but is not actively smoker.  She does not drink any alcohol and currently works as a International aid/development worker.  She reports that she is not having any issues with fevers, chills, sweats, nausea, or diarrhea.  Full 10 point ROS is listed below ? ?MEDICAL HISTORY:  ?Past Medical History:  ?Diagnosis Date  ? Anemia   ? Atrial fibrillation ([place])   ? Atrial fibrillation with RVR ([place]) 2014  ? persistent  ? Cataract   ? Chronic diastolic CHF (congestive heart failure) ([place])   ? HTN  ? Chronic diastolic congestive heart failure ([place])   ? COVID-19 virus infection 01/20/2019  ? ESOPHAGEAL STRICTURE 02/19/2005  ? Qualifier: Diagnosis of  By: Amil Amen MD, Benjamine Mola    ? Goiter   ? Heart murmur   ? History of anemia   ? unclear cause, now resolved  ? History of chicken pox   ? History of colon polyps   ? benign  ? HLD (hyperlipidemia)   ? Hypothyroidism   ? Malignant hypertension longstanding  ? Microcytosis   ? Mitral valve regurgitation   ? Osteopenia 12/2013  ? T -2.3 hip  ?  Osteoporosis   ? Pulmonary hypertension ([place])   ? Rheumatoid arthritis ([place]) 11/2015  ? +RF, +CCP, ESR 49, synovitis on exam and [place] 11/2015 Trudie Reed)  ? Right lower lobe pneumonia 07/24/2014  ? 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 29 mm Medtronic Mosaic stented porcine bioprosthetic tissue valve  ? Tricuspid regurgitation   ? Vitamin D deficiency   ? ? ?SURGICAL HISTORY: ?Past Surgical History:  ?Procedure Laterality Date  ? CARDIAC CATHETERIZATION  2014  ?  normal per patient  ? CLIPPING OF ATRIAL APPENDAGE N/A 10/28/2020  ? Procedure: CLIPPING OF ATRIAL APPENDAGE USING ATRICURE  CLIP SIZE 45MM;  Surgeon: Rexene Alberts, MD;  Location: [place];  Service: Open Heart Surgery;  Laterality: N/A;  ? COLONOSCOPY  11/2015  ? TA, int hem, rpt 5 yrs Henrene Pastor)  ? COLONOSCOPY  05/2021  ? WNL no repeat recommended Henrene Pastor)  ? LEFT HEART CATHETERIZATION WITH CORONARY ANGIOGRAM N/A 05/01/2013  ? Procedure: LEFT HEART CATHETERIZATION WITH CORONARY ANGIOGRAM;  Surgeon: Leonie Man, MD;  Location[facility] CATH LAB;  Service: Cardiovascular;  Laterality: N/A;  ? MITRAL VALVE REPAIR N/A 10/28/2020  ? Procedure: MITRAL VALVE REPLACEMENT (MVR) USING MEDTRONIC MOSAIC VALVE SIZE 29MM;  Surgeon: Rexene Alberts, MD;  Location: [place];  Service: Open Heart Surgery;  Laterality: N/A;  ? MULTIPLE EXTRACTIONS WITH ALVEOLOPLASTY N/A 10/24/2020  ? Procedure: MULTIPLE EXTRACTION WITH ALVEOLOPLASTY;  Surgeon: Charlaine Dalton, DMD;  Location: [place];  Service: Dentistry;  Laterality: N/A;  ? MULTIPLE TOOTH EXTRACTIONS  10/2020  ? Extractions of teeth numbers 3, 4, 7, 8, 9, 10, 11 and 14 - prior to valvular surgery  ? PARTIAL HYSTERECTOMY  1970  ? fibroids  ? RIGHT HEART CATH N/A 10/25/2020  ? Procedure: RIGHT HEART CATH;  Surgeon: Jolaine Artist, MD;  Location: [place] CV LAB;  Service: Cardiovascular;  Laterality: N/A;  ? RIGHT/LEFT HEART CATH AND CORONARY ANGIOGRAPHY N/A 10/21/2020  ? Procedure: RIGHT/LEFT HEART CATH AND CORONARY ANGIOGRAPHY;  Surgeon: Lorretta Harp, MD;  Location: [place] CV LAB;  Service: Cardiovascular;  Laterality: N/A;  ? TEE WITHOUT CARDIOVERSION N/A 10/21/2020  ? Procedure: TRANSESOPHAGEAL ECHOCARDIOGRAM (TEE);  Surgeon: Lelon Perla, MD;  Location[facility] ENDOSCOPY;  Service: Cardiovascular;  Laterality: N/A;  ? TEE WITHOUT CARDIOVERSION N/A 10/28/2020  ? Procedure: TRANSESOPHAGEAL ECHOCARDIOGRAM (TEE);  Surgeon: Rexene Alberts, MD;  Location: [place];   Service: Open Heart Surgery;  Laterality: N/A;  ? ? ?SOCIAL HISTORY: ?Social History  ? ?Socioeconomic History  ? Marital status: Single  ?  Spouse name: Not on file  ? Number of children: 1  ? Years of education: Not on file  ? Highest education level: Not on file  ?Occupational History  ? Occupation: Retired  ?Tobacco Use  ? Smoking status: Former  ?  Types: Cigarettes  ?  Quit date: 05/16/1969  ?  Years since quitting: 52.4  ? Smokeless tobacco: Former  ?Vaping Use  ? Vaping Use: Never used  ?Substance and Sexual Activity  ? Alcohol use: No  ?  Alcohol/week: 0.0 standard drinks  ? Drug use: No  ? Sexual activity: Not Currently  ?Other Topics Concern  ? Not on file  ?Social History Narrative  ? Lives with grand-daughter and godson  ? 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 Edu: HS  ? ?Social Determinants of Health  ? ?Financial Resource Strain: Low Risk   ? Difficulty  of Paying Living Expenses: Not hard at all  ?Food Insecurity: No Food Insecurity  ? Worried About Charity fundraiser in the Last Year: Never true  ? Ran Out of Food in the Last Year: Never true  ?Transportation Needs: No Transportation Needs  ? Lack of Transportation (Medical): No  ? Lack of Transportation (Non-Medical): No  ?Physical Activity: Insufficiently Active  ? Days of Exercise per Week: 3 days  ? Minutes of Exercise per Session: 30 min  ?Stress: No Stress Concern Present  ? Feeling of Stress : Not at all  ?Social Connections: Moderately Integrated  ? Frequency of Communication with Friends and Family: More than three times a week  ? Frequency of Social Gatherings with Friends and Family: More than three times a week  ? Attends Religious Services: More than 4 times per year  ? Active Member of Clubs or Organizations: Yes  ? Attends Archivist Meetings: More than 4 times per year  ? Marital Status: Divorced  ?Intimate Partner Violence: Not At Risk  ? Fear of Current or Ex-Partner: No   ? Emotionally Abused: No  ? Physically Abused: No  ? Sexually Abused: No  ? ? ?FAMILY HISTORY: ?Family History  ?Problem Relation Age of Onset  ? Cancer Mother   ?     ovarian  ? Sudden death Mother   ? [place]

## 2021-10-17 LAB — KAPPA/LAMBDA LIGHT CHAINS
Kappa free light chain: 52.5 mg/L — ABNORMAL HIGH (ref 3.3–19.4)
Kappa, lambda light chain ratio: 1.96 — ABNORMAL HIGH (ref 0.26–1.65)
Lambda free light chains: 26.8 mg/L — ABNORMAL HIGH (ref 5.7–26.3)

## 2021-10-20 LAB — MULTIPLE MYELOMA PANEL, SERUM
Albumin SerPl Elph-Mcnc: 4 g/dL (ref 2.9–4.4)
Albumin/Glob SerPl: 1.1 (ref 0.7–1.7)
Alpha 1: 0.3 g/dL (ref 0.0–0.4)
Alpha2 Glob SerPl Elph-Mcnc: 0.7 g/dL (ref 0.4–1.0)
B-Globulin SerPl Elph-Mcnc: 1 g/dL (ref 0.7–1.3)
Gamma Glob SerPl Elph-Mcnc: 2 g/dL — ABNORMAL HIGH (ref 0.4–1.8)
Globulin, Total: 4 g/dL — ABNORMAL HIGH (ref 2.2–3.9)
IgA: 198 mg/dL (ref 64–422)
IgG (Immunoglobin G), Serum: 2167 mg/dL — ABNORMAL HIGH (ref 586–1602)
IgM (Immunoglobulin M), Srm: 154 mg/dL (ref 26–217)
Total Protein ELP: 8 g/dL (ref 6.0–8.5)

## 2021-10-20 LAB — METHYLMALONIC ACID, SERUM: Methylmalonic Acid, Quantitative: 385 nmol/L — ABNORMAL HIGH (ref 0–378)

## 2021-10-24 ENCOUNTER — Other Ambulatory Visit (HOSPITAL_COMMUNITY): Payer: Self-pay | Admitting: Internal Medicine

## 2021-10-27 ENCOUNTER — Ambulatory Visit (INDEPENDENT_AMBULATORY_CARE_PROVIDER_SITE_OTHER): Payer: Medicare HMO

## 2021-10-27 DIAGNOSIS — Z953 Presence of xenogenic heart valve: Secondary | ICD-10-CM | POA: Diagnosis not present

## 2021-10-27 DIAGNOSIS — Z7901 Long term (current) use of anticoagulants: Secondary | ICD-10-CM

## 2021-10-27 DIAGNOSIS — I4819 Other persistent atrial fibrillation: Secondary | ICD-10-CM | POA: Diagnosis not present

## 2021-10-27 LAB — POCT INR: INR: 1.8 — AB (ref 2.0–3.0)

## 2021-10-27 NOTE — Patient Instructions (Signed)
TAKE 1.5 TABLETS TODAY ONLY and then Continue 1 tablet daily, except 1/2 a tablet on Monday, Wednesday and Friday. Recheck 4 weeks.  Coumadin Clinic [phone] ? ?

## 2021-11-03 ENCOUNTER — Other Ambulatory Visit: Payer: Self-pay | Admitting: Family Medicine

## 2021-11-03 DIAGNOSIS — E039 Hypothyroidism, unspecified: Secondary | ICD-10-CM

## 2021-11-03 DIAGNOSIS — M069 Rheumatoid arthritis, unspecified: Secondary | ICD-10-CM

## 2021-11-05 ENCOUNTER — Other Ambulatory Visit (INDEPENDENT_AMBULATORY_CARE_PROVIDER_SITE_OTHER): Payer: Medicare HMO

## 2021-11-05 DIAGNOSIS — E039 Hypothyroidism, unspecified: Secondary | ICD-10-CM | POA: Diagnosis not present

## 2021-11-05 LAB — T4, FREE: Free T4: 1.24 ng/dL (ref 0.60–1.60)

## 2021-11-05 LAB — TSH: TSH: 0.42 u[IU]/mL (ref 0.35–5.50)

## 2021-11-12 ENCOUNTER — Other Ambulatory Visit: Payer: Self-pay | Admitting: Internal Medicine

## 2021-11-21 ENCOUNTER — Ambulatory Visit (INDEPENDENT_AMBULATORY_CARE_PROVIDER_SITE_OTHER): Payer: Medicare HMO

## 2021-11-21 DIAGNOSIS — Z953 Presence of xenogenic heart valve: Secondary | ICD-10-CM | POA: Diagnosis not present

## 2021-11-21 DIAGNOSIS — I4819 Other persistent atrial fibrillation: Secondary | ICD-10-CM | POA: Diagnosis not present

## 2021-11-21 DIAGNOSIS — Z7901 Long term (current) use of anticoagulants: Secondary | ICD-10-CM | POA: Diagnosis not present

## 2021-11-21 LAB — POCT INR: INR: 1.5 — AB (ref 2.0–3.0)

## 2021-11-21 NOTE — Patient Instructions (Signed)
TAKE 1.5 TABLETS TODAY ONLY and then Continue 1 tablet daily, except 1/2 a tablet on Monday, Wednesday and Friday. Recheck 4 weeks.  Coumadin Clinic [phone]; Eat less greens;

## 2021-12-11 ENCOUNTER — Other Ambulatory Visit: Payer: Self-pay | Admitting: Cardiovascular Disease

## 2021-12-16 ENCOUNTER — Ambulatory Visit (HOSPITAL_COMMUNITY): Payer: Medicare HMO | Attending: Internal Medicine

## 2021-12-16 DIAGNOSIS — I051 Rheumatic mitral insufficiency: Secondary | ICD-10-CM | POA: Insufficient documentation

## 2021-12-16 DIAGNOSIS — Z953 Presence of xenogenic heart valve: Secondary | ICD-10-CM | POA: Diagnosis not present

## 2021-12-16 LAB — ECHOCARDIOGRAM COMPLETE
Area-P 1/2: 1.45 cm2
MV VTI: 1.17 cm2
S' Lateral: 3 cm

## 2021-12-16 MED ORDER — PERFLUTREN LIPID MICROSPHERE
1.0000 mL | INTRAVENOUS | Status: AC | PRN
  Administered 2021-12-16: 1 mL via INTRAVENOUS

## 2021-12-19 ENCOUNTER — Ambulatory Visit (INDEPENDENT_AMBULATORY_CARE_PROVIDER_SITE_OTHER): Payer: Medicare HMO | Admitting: *Deleted

## 2021-12-19 DIAGNOSIS — I4819 Other persistent atrial fibrillation: Secondary | ICD-10-CM | POA: Diagnosis not present

## 2021-12-19 DIAGNOSIS — Z7901 Long term (current) use of anticoagulants: Secondary | ICD-10-CM

## 2021-12-19 DIAGNOSIS — Z953 Presence of xenogenic heart valve: Secondary | ICD-10-CM | POA: Diagnosis not present

## 2021-12-19 LAB — POCT INR: INR: 1.4 — AB (ref 2.0–3.0)

## 2021-12-19 NOTE — Patient Instructions (Addendum)
Description   TAKE 1 TABLET TODAY AND TAKE 1.5 TABLETS TOMORROW THEN START TAKING 1 tablet daily, except 1/2 tablet on Monday and Friday. Recheck 2 weeks. Coumadin Clinic [phone]

## 2022-01-02 ENCOUNTER — Telehealth: Payer: Self-pay

## 2022-01-02 ENCOUNTER — Ambulatory Visit (INDEPENDENT_AMBULATORY_CARE_PROVIDER_SITE_OTHER): Payer: Medicare HMO

## 2022-01-02 DIAGNOSIS — Z953 Presence of xenogenic heart valve: Secondary | ICD-10-CM | POA: Diagnosis not present

## 2022-01-02 DIAGNOSIS — I4819 Other persistent atrial fibrillation: Secondary | ICD-10-CM

## 2022-01-02 DIAGNOSIS — Z7901 Long term (current) use of anticoagulants: Secondary | ICD-10-CM

## 2022-01-02 LAB — POCT INR: INR: 1.9 — AB (ref 2.0–3.0)

## 2022-01-02 NOTE — Patient Instructions (Signed)
Description   START taking 1 tablet daily, except 1/2 tablet on Mondays.  Recheck 2 weeks.  Coumadin Clinic [phone]

## 2022-01-02 NOTE — Chronic Care Management (AMB) (Signed)
Chronic Care Management Pharmacy Assistant   Name: Joanna Hood  MRN: [id number] DOB: [birth year]   Reason for Encounter:Hypertension  Disease State    Recent office visits:  09/24/21-Javier Gutierrez,MD(PCP)-AWV,discussed screenings, consider shingrix,decrease levothyroxine to 130mg daily, recheck levels in 6 wks. f/u 6 months 09/05/21-Tamina McCain,LPN(fam med)- Telemedicine for AWV,discussed screenings and vaccines,   Recent consult visits:  11/21/21-Peter Jordan,MD(cardio)-no data found  10/16/21-John Dorsey,MD(onco)-Anemia,reviewed labs,records,new labs ordered,call results ( no MD notes) 09/29/21-Michelle Young(rheum)- no data found 09/15/21-Tyrone O'Neal(cardio)- no data found   Hospital visits:  None in previous 6 months  Medications: Outpatient Encounter Medications as of 01/02/2022  Medication Sig   acetaminophen (TYLENOL) 325 MG tablet Take 650 mg by mouth every 6 (six) hours as needed (pain).   amLODipine (NORVASC) 10 MG tablet TAKE 1 TABLET EVERY DAY   amoxicillin (AMOXIL) 500 MG tablet Take 4 tablets (2,000 mg total) by mouth as needed (1 hour prior to dental work).   aspirin EC 81 MG tablet Take 81 mg by mouth daily. Swallow whole.   Calcium Carb-Cholecalciferol (CALCIUM-VITAMIN D) 600-400 MG-UNIT TABS Take 1 tablet by mouth daily.   cholecalciferol (VITAMIN D) 1000 UNITS tablet Take 1,000 Units by mouth daily.   ENTRESTO 97-103 MG TAKE 1 TABLET TWICE DAILY   ferrous sulfate 325 (65 FE) MG tablet Take 1 tablet (325 mg total) by mouth every Monday, Wednesday, and Friday.   folic acid (FOLVITE) 1 MG tablet Take 1 tablet (1 mg total) by mouth daily.   furosemide (LASIX) 20 MG tablet TAKE 1 TABLET EVERY DAY   levothyroxine (SYNTHROID) 100 MCG tablet Take 1 tablet (100 mcg total) by mouth daily before breakfast.   methotrexate (RHEUMATREX) 2.5 MG tablet Take 15 mg by mouth every Friday. Caution:Chemotherapy. Protect from light.   omeprazole (PRILOSEC) 20 MG capsule  TAKE 1 CAPSULE EVERY DAY   spironolactone (ALDACTONE) 25 MG tablet Take 1 tablet (25 mg total) by mouth daily.   warfarin (COUMADIN) 4 MG tablet TAKE 1 TO 2 TABLETS EVERY DAY  OR AS DIRECTED  BY  THE  COUMADIN  CLINIC   Facility-Administered Encounter Medications as of 01/02/2022  Medication   0.9 %  sodium chloride infusion    Recent Office Vitals: BP Readings from Last 3 Encounters:  10/16/21 (!) 165/76  09/24/21 (!) 156/70  09/19/21 (!) 160/90   Pulse Readings from Last 3 Encounters:  10/16/21 67  09/24/21 (!) 59  09/19/21 67    Wt Readings from Last 3 Encounters:  10/16/21 137 lb 8 oz (62.4 kg)  09/24/21 136 lb 2 oz (61.7 kg)  09/19/21 140 lb (63.5 kg)     Kidney Function Lab Results  Component Value Date/Time   CREATININE 1.04 (H) 10/16/2021 09:38 AM   CREATININE 0.91 09/15/2021 01:55 PM   CREATININE 1.01 (H) 06/09/2021 09:53 AM   CREATININE 0.81 09/01/2013 03:12 PM   GFR 62.24 09/15/2021 01:55 PM   GFRNONAA 56 (L) 10/16/2021 09:38 AM   GFRAA 79 (L) 05/03/2013 04:55 AM       Latest Ref Rng & Units 10/16/2021    9:38 AM 09/15/2021    1:55 PM 06/09/2021    9:53 AM  BMP  Glucose 70 - 99 mg/dL 102  97  98   BUN 8 - 23 mg/dL '24  10  21   '$ Creatinine 0.44 - 1.00 mg/dL 1.04  0.91  1.01   Sodium 135 - 145 mmol/L 138  136  140   Potassium 3.5 -  5.1 mmol/L 3.9  3.7  4.2   Chloride 98 - 111 mmol/L 103  100  107   CO2 22 - 32 mmol/L '27  27  27   '$ Calcium 8.9 - 10.3 mg/dL 9.9  9.3  9.5      Contacted patient on 01/08/22 to discuss hypertension disease state  Current antihypertensive regimen:   Amlodipine 10 mg - 1 tablet daily  Furosemide '20mg'$ - take 1/2 tablet daily   Spironolactone '25mg'$ -take 1 tablet daily    Patient verbally confirms she is taking the above medications as directed. Yes  How often are you checking your Blood Pressure? daily  she checks her blood pressure in the morning before taking her medication.  Current home BP readings:   DATE:              BP               PULSE  01/07/22 131/66  -    PM  126/66  -  01/08/22 123/68  -    Wrist or arm cuff: arm cuff   she has 2 units,  manuel  and electric  Caffeine intake:  The patient prefers decaff coffee in the am  Salt intake: limits adding to foods Over the counter medications including pseudoephedrine or NSAIDs?  '81mg'$  asa  Any readings above 180/120? No   What recent interventions/DTPs have been made by any provider to improve Blood Pressure control since last CPP Visit:  maintain home BP readings  Any recent hospitalizations or ED visits since last visit with CPP? No  What diet changes have been made to improve Blood Pressure Control?    The patient maintain < 1500 mg salt daily and drinks plenty of water daily   What exercise is being done to improve your Blood Pressure Control?  The patient enjoys walking for exercise and has some light weights at home from PT to use with home exercise.  Adherence Review: Is the patient currently on ACE/ARB medication? Yes Does the patient have >5 day gap between last estimated fill dates? No   Star Rating Drugs:  Medication:  Last Fill: Day Supply Entresto 97-'103mg'$  12/11/21 90   Care Gaps: Annual wellness visit in last year? Yes Most Recent BP reading:  156/70  59-P  09/24/21   Upcoming appointments: PCP appointment on 03/27/22    Dexa scan 02/24/22   Charlene Brooke, CPP notified  Avel Sensor, [place]  [phone]

## 2022-01-16 ENCOUNTER — Ambulatory Visit (INDEPENDENT_AMBULATORY_CARE_PROVIDER_SITE_OTHER): Payer: Medicare HMO | Admitting: *Deleted

## 2022-01-16 DIAGNOSIS — I4819 Other persistent atrial fibrillation: Secondary | ICD-10-CM

## 2022-01-16 DIAGNOSIS — Z7901 Long term (current) use of anticoagulants: Secondary | ICD-10-CM

## 2022-01-16 DIAGNOSIS — Z953 Presence of xenogenic heart valve: Secondary | ICD-10-CM | POA: Diagnosis not present

## 2022-01-16 LAB — POCT INR: INR: 1.7 — AB (ref 2.0–3.0)

## 2022-01-16 NOTE — Patient Instructions (Signed)
Description   Today take 1.5 tablets then START taking 1 tablet daily. Recheck 2 weeks.  [facility] [phone]

## 2022-01-24 ENCOUNTER — Other Ambulatory Visit: Payer: Self-pay | Admitting: Cardiovascular Disease

## 2022-01-24 DIAGNOSIS — I4819 Other persistent atrial fibrillation: Secondary | ICD-10-CM

## 2022-01-30 ENCOUNTER — Ambulatory Visit (INDEPENDENT_AMBULATORY_CARE_PROVIDER_SITE_OTHER): Payer: Medicare HMO

## 2022-01-30 DIAGNOSIS — Z953 Presence of xenogenic heart valve: Secondary | ICD-10-CM | POA: Diagnosis not present

## 2022-01-30 DIAGNOSIS — I4819 Other persistent atrial fibrillation: Secondary | ICD-10-CM

## 2022-01-30 DIAGNOSIS — Z7901 Long term (current) use of anticoagulants: Secondary | ICD-10-CM

## 2022-01-30 LAB — POCT INR: INR: 2.2 (ref 2.0–3.0)

## 2022-01-30 NOTE — Patient Instructions (Signed)
Description   Continue taking 1 tablet daily. Recheck 3 weeks.  [facility] [phone]

## 2022-02-20 ENCOUNTER — Ambulatory Visit: Payer: Medicare HMO | Attending: Cardiology

## 2022-02-20 DIAGNOSIS — Z7901 Long term (current) use of anticoagulants: Secondary | ICD-10-CM | POA: Diagnosis not present

## 2022-02-20 DIAGNOSIS — I4819 Other persistent atrial fibrillation: Secondary | ICD-10-CM | POA: Diagnosis not present

## 2022-02-20 DIAGNOSIS — Z953 Presence of xenogenic heart valve: Secondary | ICD-10-CM

## 2022-02-20 LAB — POCT INR: INR: 2.3 (ref 2.0–3.0)

## 2022-02-20 NOTE — Patient Instructions (Signed)
Continue taking 1 tablet daily. Recheck 4  weeks.  [facility] [phone]

## 2022-02-24 ENCOUNTER — Ambulatory Visit
Admission: RE | Admit: 2022-02-24 | Discharge: 2022-02-24 | Disposition: A | Discharge status: Home / Self Care | Payer: Medicare HMO | Source: Ambulatory Visit | Attending: Family Medicine | Admitting: Family Medicine

## 2022-02-24 DIAGNOSIS — Z780 Asymptomatic menopausal state: Secondary | ICD-10-CM | POA: Diagnosis not present

## 2022-02-24 DIAGNOSIS — M8589 Other specified disorders of bone density and structure, multiple sites: Secondary | ICD-10-CM | POA: Diagnosis not present

## 2022-03-20 ENCOUNTER — Ambulatory Visit: Payer: Medicare HMO | Attending: Cardiovascular Disease

## 2022-03-20 DIAGNOSIS — Z5181 Encounter for therapeutic drug level monitoring: Secondary | ICD-10-CM

## 2022-03-20 DIAGNOSIS — Z953 Presence of xenogenic heart valve: Secondary | ICD-10-CM

## 2022-03-20 DIAGNOSIS — I4819 Other persistent atrial fibrillation: Secondary | ICD-10-CM

## 2022-03-20 LAB — POCT INR: INR: 2.5 (ref 2.0–3.0)

## 2022-03-20 NOTE — Patient Instructions (Signed)
Continue taking 1 tablet daily. Recheck 4  weeks.  [facility] [phone]

## 2022-03-27 ENCOUNTER — Encounter: Payer: Self-pay | Admitting: Family Medicine

## 2022-03-27 ENCOUNTER — Telehealth: Payer: Self-pay | Admitting: Family Medicine

## 2022-03-27 ENCOUNTER — Ambulatory Visit (INDEPENDENT_AMBULATORY_CARE_PROVIDER_SITE_OTHER): Payer: Medicare HMO | Admitting: Family Medicine

## 2022-03-27 VITALS — BP 128/60 | HR 48 | Temp 97.7°F | Ht 62.25 in | Wt 137.0 lb

## 2022-03-27 DIAGNOSIS — I4819 Other persistent atrial fibrillation: Secondary | ICD-10-CM

## 2022-03-27 DIAGNOSIS — Z953 Presence of xenogenic heart valve: Secondary | ICD-10-CM | POA: Diagnosis not present

## 2022-03-27 DIAGNOSIS — I5032 Chronic diastolic (congestive) heart failure: Secondary | ICD-10-CM | POA: Diagnosis not present

## 2022-03-27 DIAGNOSIS — I10 Essential (primary) hypertension: Secondary | ICD-10-CM | POA: Diagnosis not present

## 2022-03-27 DIAGNOSIS — M069 Rheumatoid arthritis, unspecified: Secondary | ICD-10-CM

## 2022-03-27 DIAGNOSIS — E039 Hypothyroidism, unspecified: Secondary | ICD-10-CM

## 2022-03-27 DIAGNOSIS — D638 Anemia in other chronic diseases classified elsewhere: Secondary | ICD-10-CM

## 2022-03-27 DIAGNOSIS — Z23 Encounter for immunization: Secondary | ICD-10-CM

## 2022-03-27 NOTE — Assessment & Plan Note (Signed)
Persists. On coumadin.

## 2022-03-27 NOTE — Telephone Encounter (Signed)
Spoke with patient, verified which pharmacy she received the vaccine at. Called the pharmacy and verified she received her 1st shingles shot 10/23/2021 and 2nd 01/09/22. Documented in chart.

## 2022-03-27 NOTE — Telephone Encounter (Signed)
Pt called stating Dr. Darnell Level told her to call back after ov on 03/27/22 to give the dates of her shingles shots. The 1st shot was given on 01/09/22. Pt states she cannot find when the 2nd shot was given. Call back # [phone]

## 2022-03-27 NOTE — Assessment & Plan Note (Signed)
Appreciate rheumatology care. Upcoming appt next week.

## 2022-03-27 NOTE — Telephone Encounter (Signed)
Patient called back in returning call she missed.  

## 2022-03-27 NOTE — Assessment & Plan Note (Addendum)
Latest TSH 0.42 on current levothyroxine dose - continue this.

## 2022-03-27 NOTE — Progress Notes (Signed)
Patient ID: Joanna Hood, female    DOB: 06-Jan-1946, 76 y.o.   MRN: [id number]  This visit was conducted in person.  BP 128/60 Comment: at home yesterday  Pulse (!) 48   Temp 97.7 F (36.5 C) (Oral)   Ht 5' 2.25" (1.581 m)   Wt 137 lb (62.1 kg)   SpO2 97%   BMI 24.86 kg/m   Elevated in office, well controlled at home. (128/60)  CC: 6 mo f/u visit  Subjective:   HPI: Joanna Hood is a 76 y.o. female presenting on 03/27/2022 for Follow-up (F/U 6 mo)   In process of moving.  S/p bioprosthetic MVR for severe MR with clipped atrial appendage 2022 Roxy Manns), h/o G3DD with severe LA enlargement, persistent afib now on coumadin. Needs SBE ppx. Upcoming cardiology appt 04/17/2022.   Persistent microcytic anemia despite oral iron replacement - saw hematology, thought anemia of chronic disease. Oral iron replacement stopped.   RA - on MTX '15mg'$  weekly sees rheum Q6 mo Trudie Reed) - upcoming appt next week. Symptoms very well controlled. upcoming appt next week.   HTN - Compliant with current antihypertensive regimen of amlodipine '10mg'$  daily, lasix '20mg'$  daily, entresto 97/103 bid, and spironolactone '25mg'$  daily. Does check blood pressures at home twice daily - normally 120-130s/80s.  No low blood pressure readings or symptoms of dizziness/syncope.  Denies HA, vision changes, CP/tightness, SOB, leg swelling.       Relevant past medical, surgical, family and social history reviewed and updated as indicated. Interim medical history since our last visit reviewed. Allergies and medications reviewed and updated. Outpatient Medications Prior to Visit  Medication Sig Dispense Refill   acetaminophen (TYLENOL) 325 MG tablet Take 650 mg by mouth every 6 (six) hours as needed (pain).     amLODipine (NORVASC) 10 MG tablet TAKE 1 TABLET EVERY DAY 90 tablet 3   amoxicillin (AMOXIL) 500 MG tablet Take 4 tablets (2,000 mg total) by mouth as needed (1 hour prior to dental work). 4 tablet 3    aspirin EC 81 MG tablet Take 81 mg by mouth daily. Swallow whole.     Calcium Carb-Cholecalciferol (CALCIUM-VITAMIN D) 600-400 MG-UNIT TABS Take 1 tablet by mouth daily.     cholecalciferol (VITAMIN D) 1000 UNITS tablet Take 1,000 Units by mouth daily.     ENTRESTO 97-103 MG TAKE 1 TABLET TWICE DAILY 102 tablet 3   folic acid (FOLVITE) 1 MG tablet Take 1 tablet (1 mg total) by mouth daily. 90 tablet 3   furosemide (LASIX) 20 MG tablet TAKE 1 TABLET EVERY DAY 90 tablet 0   levothyroxine (SYNTHROID) 100 MCG tablet Take 1 tablet (100 mcg total) by mouth daily before breakfast. 90 tablet 3   methotrexate (RHEUMATREX) 2.5 MG tablet Take 15 mg by mouth every Friday. Caution:Chemotherapy. Protect from light.     omeprazole (PRILOSEC) 20 MG capsule TAKE 1 CAPSULE EVERY DAY 90 capsule 1   spironolactone (ALDACTONE) 25 MG tablet Take 1 tablet (25 mg total) by mouth daily. 90 tablet 3   warfarin (COUMADIN) 4 MG tablet TAKE 1 TABLET EVERY DAY OR AS DIRECTED  BY THE COUMADIN CLINIC 100 tablet 1   ferrous sulfate 325 (65 FE) MG tablet Take 1 tablet (325 mg total) by mouth every Monday, Wednesday, and Friday.     Facility-Administered Medications Prior to Visit  Medication Dose Route Frequency Provider Last Rate Last Admin   0.9 %  sodium chloride infusion  500 mL Intravenous Once Scarlette Shorts  N, MD         Per HPI unless specifically indicated in ROS section below Review of Systems  Objective:  BP 128/60 Comment: at home yesterday  Pulse (!) 48   Temp 97.7 F (36.5 C) (Oral)   Ht 5' 2.25" (1.581 m)   Wt 137 lb (62.1 kg)   SpO2 97%   BMI 24.86 kg/m   Wt Readings from Last 3 Encounters:  03/27/22 137 lb (62.1 kg)  10/16/21 137 lb 8 oz (62.4 kg)  09/24/21 136 lb 2 oz (61.7 kg)      Physical Exam Vitals and nursing note reviewed.  Constitutional:      Appearance: Normal appearance. She is not ill-appearing.  HENT:     Mouth/Throat:     Mouth: Mucous membranes are moist.     Pharynx:  Oropharynx is clear. No oropharyngeal exudate or posterior oropharyngeal erythema.  Eyes:     Extraocular Movements: Extraocular movements intact.     Pupils: Pupils are equal, round, and reactive to light.     Comments: Cataracts present  Cardiovascular:     Rate and Rhythm: Normal rate. Rhythm irregularly irregular.     Pulses: Normal pulses.     Heart sounds: Murmur (3/6 systolic USB) heard.  Pulmonary:     Effort: Pulmonary effort is normal. No respiratory distress.     Breath sounds: Normal breath sounds. No wheezing, rhonchi or rales.  Musculoskeletal:     Right lower leg: No edema.     Left lower leg: No edema.  Skin:    General: Skin is warm and dry.     Findings: No rash.  Neurological:     Mental Status: She is alert.  Psychiatric:        Mood and Affect: Mood normal.        Behavior: Behavior normal.       Results for orders placed or performed in visit on 03/20/22  POCT INR  Result Value Ref Range   INR 2.5 2.0 - 3.0   Lab Results  Component Value Date   TSH 0.42 11/05/2021    Assessment & Plan:   Problem List Items Addressed This Visit     Hypothyroidism    Latest TSH 0.42 on current levothyroxine dose - continue this.       Persistent atrial fibrillation ([place])    Persists. On coumadin.       HTN (hypertension), malignant - Primary    Chronic, good control based on home readings although in office readings elevated. No changes made today.       Anemia of chronic disease    Chronic microcytic anemia unresponsive to iron - saw heme, dx anemia of chronic disease. Now off oral iron replacement.       Rheumatoid arthritis [facility])    Appreciate rheumatology care. Upcoming appt next week.       Chronic heart failure with preserved ejection fraction (HFpEF) ([place])    Appreciate cards care - continues seeing CHF clinic, last seen 08/2021.       S/P mitral valve replacement with bioprosthetic valve   Other Visit Diagnoses     Need for influenza  vaccination       Relevant Orders   Flu vaccine HIGH DOSE PF (Fluzone High dose)   Need for immunization against influenza       Relevant Orders   Flu Vaccine QUAD High Dose(Fluad) (Completed)        No orders of the defined  types were placed in this encounter.  Orders Placed This Encounter  Procedures   Flu vaccine HIGH DOSE PF (Fluzone High dose)   Flu Vaccine QUAD High Dose(Fluad)     Patient Instructions  Flu shot today  Call cardiology to schedule 1 year follow up visit.  Send [place] dates of shingles shots at [facility].  You are doing well today Return in 6 months for physical/wellness visit   Follow up plan: Return in about 6 months (around 09/26/2022), or if symptoms worsen or fail to improve, for annual exam, prior fasting for blood work, medicare wellness visit.  Ria Bush, MD

## 2022-03-27 NOTE — Assessment & Plan Note (Signed)
Appreciate cards care - continues seeing CHF clinic, last seen 08/2021.

## 2022-03-27 NOTE — Assessment & Plan Note (Addendum)
Chronic microcytic anemia unresponsive to iron - saw heme, dx anemia of chronic disease. Now off oral iron replacement.

## 2022-03-27 NOTE — Telephone Encounter (Signed)
Unable to reach patient. Left voicemail to return call to our office.   

## 2022-03-27 NOTE — Assessment & Plan Note (Signed)
Chronic, good control based on home readings although in office readings elevated. No changes made today.

## 2022-03-27 NOTE — Patient Instructions (Addendum)
Flu shot today  Call cardiology to schedule 1 year follow up visit.  Send [place] dates of shingles shots at [facility].  You are doing well today Return in 6 months for physical/wellness visit

## 2022-04-06 DIAGNOSIS — Z79899 Other long term (current) drug therapy: Secondary | ICD-10-CM | POA: Diagnosis not present

## 2022-04-06 DIAGNOSIS — Z1589 Genetic susceptibility to other disease: Secondary | ICD-10-CM | POA: Diagnosis not present

## 2022-04-06 DIAGNOSIS — M1991 Primary osteoarthritis, unspecified site: Secondary | ICD-10-CM | POA: Diagnosis not present

## 2022-04-06 DIAGNOSIS — M0579 Rheumatoid arthritis with rheumatoid factor of multiple sites without organ or systems involvement: Secondary | ICD-10-CM | POA: Diagnosis not present

## 2022-04-06 DIAGNOSIS — Z6823 Body mass index (BMI) 23.0-23.9, adult: Secondary | ICD-10-CM | POA: Diagnosis not present

## 2022-04-20 ENCOUNTER — Ambulatory Visit: Payer: Medicare HMO | Admitting: Cardiovascular Disease

## 2022-04-20 ENCOUNTER — Encounter: Payer: Self-pay | Admitting: Cardiovascular Disease

## 2022-04-20 ENCOUNTER — Ambulatory Visit: Payer: Medicare HMO | Attending: Cardiovascular Disease | Admitting: *Deleted

## 2022-04-20 DIAGNOSIS — I4819 Other persistent atrial fibrillation: Secondary | ICD-10-CM

## 2022-04-20 DIAGNOSIS — I5032 Chronic diastolic (congestive) heart failure: Secondary | ICD-10-CM | POA: Diagnosis not present

## 2022-04-20 DIAGNOSIS — Z7901 Long term (current) use of anticoagulants: Secondary | ICD-10-CM

## 2022-04-20 DIAGNOSIS — Z953 Presence of xenogenic heart valve: Secondary | ICD-10-CM | POA: Diagnosis not present

## 2022-04-20 DIAGNOSIS — I340 Nonrheumatic mitral (valve) insufficiency: Secondary | ICD-10-CM | POA: Diagnosis not present

## 2022-04-20 DIAGNOSIS — I10 Essential (primary) hypertension: Secondary | ICD-10-CM

## 2022-04-20 LAB — POCT INR: INR: 2.6 (ref 2.0–3.0)

## 2022-04-20 NOTE — Assessment & Plan Note (Signed)
History of persistent A-fib rate controlled on Coumadin oral anticoagulation.  Her INR levels are checked in our Coumadin clinic.

## 2022-04-20 NOTE — Patient Instructions (Signed)
Medication Instructions:  Your physician recommends that you continue on your current medications as directed. Please refer to the Current Medication list given to you today.  *If you need a refill on your cardiac medications before your next appointment, please call your pharmacy*   Lab Work: Your physician recommends that you have labs drawn today: Lipid/liver panel  If you have labs (blood work) drawn today and your tests are completely normal, you will receive your results only by: MyChart Message (if you have MyChart) OR A paper copy in the mail If you have any lab test that is abnormal or we need to change your treatment, we will call you to review the results.   Testing/Procedures: Your physician has requested that you have an echocardiogram. Echocardiography is a painless test that uses sound waves to create images of your heart. It provides your doctor with information about the size and shape of your heart and how well your heart's chambers and valves are working. This procedure takes approximately one hour. There are no restrictions for this procedure. Please do NOT wear cologne, perfume, aftershave, or lotions (deodorant is allowed). Please arrive 15 minutes prior to your appointment time. To be done in June 2024. This procedure will be done at [address]    Follow-Up: At [facility], you and your health needs are our priority.  As part of our continuing mission to provide you with exceptional heart care, we have created designated Provider Care Teams.  These Care Teams include your primary Cardiologist (physician) and Advanced Practice Providers (APPs -  Physician Assistants and Nurse Practitioners) who all work together to provide you with the care you need, when you need it.  We recommend signing up for the patient portal called "MyChart".  Sign up information is provided on this After Visit Summary.  MyChart is used to connect with patients for  Virtual Visits (Telemedicine).  Patients are able to view lab/test results, encounter notes, upcoming appointments, etc.  Non-urgent messages can be sent to your provider as well.   To learn more about what you can do with MyChart, go to [email or web address].    Your next appointment:   12 month(s)  The format for your next appointment:   In Person  Provider:   Quay Burow, MD

## 2022-04-20 NOTE — Assessment & Plan Note (Signed)
History of diastolic dysfunction on oral diuretics.

## 2022-04-20 NOTE — Assessment & Plan Note (Signed)
History of essential hypertension blood pressure measured today at 148/66.  She is on amlodipine

## 2022-04-20 NOTE — Progress Notes (Signed)
04/20/2022 Chong Sicilian Vesey   March 23, 1946  [id number]  Primary Physician Ria Bush, MD Primary Cardiologist: Lorretta Harp MD [name], [place]  HPI:  Joanna Hood is a 76 y.o.  moderately overweight African-American female who I initially met several years ago. I last saw her in the office 03/21/2021.   We first met when she was admitted to [facility] with diastolic heart failure, hypertension and atrial fibrillation. She ultimately underwent cardiac catheterization revealing essentially normal coronary arteries and normal LV function by Dr. Ellyn Hack. Her A. Fib converted to sinus rhythm with the aid of IV Cardizem. Her blood pressure was better controlled. She saw Ellen Henri PA-C in the office  and again was in atrial fibrillation at which time her beta blocker was adjusted and Eliquis was added because of increased CHA2DSVASC2 score of 2. Since I saw her in the office she remained clinically stable. She denies chest pain or shortness of breath.   She remains in A. fib on Eliquis oral anticoagulation.  She did contract COVID-19 in late July 2020 and completed her quarantine a week ago.  She had mild flulike symptoms.  She did have a 2D echo performed 05/08/2015 that showed normal LV function with moderate mitral regurgitation and severe left atrial enlargement.    She was complaining of dyspnea on exertion.  Her 2D echo confirmed severe MR.  I ultimately performed right and left heart cath on her 65/2/22 revealing normal coronary arteries and severe MR.  1 week later she underwent mitral valve replacement by Dr. Roxy Manns via median sternotomy on 10/28/2020.  She had a Medtronic Mosaic stented porcine bioprosthetic tissue valve placed (29 mm) along with a clipping of her left atrial appendage.  Her postop course was somewhat protracted.  She was discharged home 1 week later.  She was difficult to wean off pressors postoperatively.  She has seen Dr. [name],  her heart failure cardiologist, and was doing well at that time.  She is participating cardiac rehab.  She no longer has peripheral edema like she did preoperatively.  She is on Coumadin anticoagulation.  Since I saw her a year ago she continues to do well.  She is very active.  She denies chest pain or shortness of breath.  She has no symptoms of heart failure.  Her most recent 2D echo performed 12/16/2021 revealed normal LV systolic function, mildly reduced RV systolic function with a well-functioning mitral bioprosthesis.  There is no MR.  There was severely dilated left atrium.  Pulmonary pressures could not be assessed.   Current Meds  Medication Sig   acetaminophen (TYLENOL) 325 MG tablet Take 650 mg by mouth every 6 (six) hours as needed (pain).   amLODipine (NORVASC) 10 MG tablet TAKE 1 TABLET EVERY DAY   aspirin EC 81 MG tablet Take 81 mg by mouth daily. Swallow whole.   Calcium Carb-Cholecalciferol (CALCIUM-VITAMIN D) 600-400 MG-UNIT TABS Take 1 tablet by mouth daily.   cholecalciferol (VITAMIN D) 1000 UNITS tablet Take 1,000 Units by mouth daily.   ENTRESTO 97-103 MG TAKE 1 TABLET TWICE DAILY   folic acid (FOLVITE) 1 MG tablet Take 1 tablet (1 mg total) by mouth daily.   furosemide (LASIX) 20 MG tablet TAKE 1 TABLET EVERY DAY   levothyroxine (SYNTHROID) 100 MCG tablet Take 1 tablet (100 mcg total) by mouth daily before breakfast.   methotrexate (RHEUMATREX) 2.5 MG tablet Take 15 mg by mouth every Friday. Caution:Chemotherapy. Protect from light.  omeprazole (PRILOSEC) 20 MG capsule TAKE 1 CAPSULE EVERY DAY   spironolactone (ALDACTONE) 25 MG tablet Take 1 tablet (25 mg total) by mouth daily.   warfarin (COUMADIN) 4 MG tablet TAKE 1 TABLET EVERY DAY OR AS DIRECTED  BY THE COUMADIN CLINIC   Current Facility-Administered Medications for the 04/20/22 encounter (Office Visit) with Lorretta Harp, MD  Medication   0.9 %  sodium chloride infusion     Allergies  Allergen Reactions    Metronidazole Itching and Rash    Social History   Socioeconomic History   Marital status: Single    Spouse name: Not on file   Number of children: 1   Years of education: Not on file   Highest education level: Not on file  Occupational History   Occupation: Retired  Tobacco Use   Smoking status: Former    Types: Cigarettes    Quit date: 05/16/1969    Years since quitting: 52.9   Smokeless tobacco: Former  Scientific laboratory technician Use: Never used  Substance and Sexual Activity   Alcohol use: No    Alcohol/week: 0.0 standard drinks of alcohol   Drug use: No   Sexual activity: Not Currently  Other Topics Concern   Not on file  Social History Narrative   Lives with grand-daughter and godson   Occupation: retired, 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   Edu: [place] Strain: [place]  (09/05/2021)   Overall Financial Resource Strain ([place])    Difficulty of Paying Living Expenses: Not hard at [place]: No [place] (09/05/2021)   Hunger Vital Sign    Worried About Running Out of Food in the Last Year: Never true    [place] in the Last Year: Never true  Transportation Needs: No Transportation Needs (09/05/2021)   PRAPARE - Hydrologist (Medical): No    Lack of Transportation (Non-Medical): No  Physical Activity: Insufficiently Active (09/05/2021)   Exercise Vital Sign    Days of Exercise per Week: 3 days    Minutes of Exercise per Session: 30 min  Stress: No Stress Concern Present (09/05/2021)   [place]    Feeling of Stress : Not at all  Social Connections: Moderately Integrated (09/05/2021)   Social Connection and Isolation Panel [NHANES]    Frequency of Communication with Friends and Family: More than three times a week    Frequency of Social Gatherings with Friends and Family:  More than three times a week    Attends Religious Services: More than 4 times per year    Active Member of [employer] or Organizations: Yes    Attends Music therapist: More than 4 times per year    Marital Status: Divorced  Intimate Partner Violence: Not At Risk (09/05/2021)   Humiliation, Afraid, Rape, and Kick questionnaire    Fear of Current or Ex-Partner: No    Emotionally Abused: No    Physically Abused: No    Sexually Abused: No     Review of Systems: General: negative for chills, fever, night sweats or weight changes.  Cardiovascular: negative for chest pain, dyspnea on exertion, edema, orthopnea, palpitations, paroxysmal nocturnal dyspnea or shortness of breath Dermatological: negative for rash Respiratory: negative for cough or wheezing Urologic: negative for hematuria Abdominal: negative for nausea, vomiting, diarrhea, bright red blood per rectum,  melena, or hematemesis Neurologic: negative for visual changes, syncope, or dizziness All other systems reviewed and are otherwise negative except as noted above.    Blood pressure (!) 148/66, pulse 73, height _0  (1.6 m), weight 132 lb 12.8 oz (60.2 kg), SpO2 99 %.  General appearance: alert and no distress Neck: no adenopathy, no carotid bruit, no JVD, supple, symmetrical, trachea midline, and thyroid not enlarged, symmetric, no tenderness/mass/nodules Lungs: clear to auscultation bilaterally Heart: irregularly irregular rhythm Extremities: extremities normal, atraumatic, no cyanosis or edema Pulses: 2+ and symmetric Skin: Skin color, texture, turgor normal. No rashes or lesions Neurologic: Grossly normal  EKG atrial fibrillation with inferolateral T wave inversion and poor R wave progression.  I personally reviewed this EKG.  ASSESSMENT AND PLAN:   Mitral valve regurgitation History of severe MR status post right left heart cath by myself 10/24/2020 revealing normal coronary arteries with severe MR.  1 week later  she underwent mitral valve replacement by Dr. Roxy Manns via median sternotomy on 10/28/2020.  She had a porcine Medtronic Mosaic stented valve (29 mm) with clipping of her left atrial appendage.  She did successfully complete cardiac rehab.  Recent 2D echo performed 12/16/2021 revealed normal LV systolic function, mildly reduced RV systolic function.  A well-functioning mitral bioprosthesis with trivial MR and left atrial dilatation.  This will be repeated in 1 year.  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  Persistent atrial fibrillation (HCC) History of persistent A-fib rate controlled on Coumadin oral anticoagulation.  Her INR levels are checked in our Coumadin clinic.  HTN (hypertension), malignant History of essential hypertension blood pressure measured today at 148/66.  She is on amlodipine  Chronic heart failure with preserved ejection fraction (HFpEF) ([place]) History of diastolic dysfunction on oral diuretics.     Lorretta Harp MD FACP,FACC,FAHA, [facility] 04/20/2022 9:19 AM

## 2022-04-20 NOTE — Patient Instructions (Signed)
Description   Continue taking warfarin 1 tablet daily. Recheck 5 weeks. Coumadin Clinic [phone]      

## 2022-04-20 NOTE — Assessment & Plan Note (Signed)
History of severe MR status post right left heart cath by myself 10/24/2020 revealing normal coronary arteries with severe MR.  1 week later she underwent mitral valve replacement by Dr. Roxy Manns via median sternotomy on 10/28/2020.  She had a porcine Medtronic Mosaic stented valve (29 mm) with clipping of her left atrial appendage.  She did successfully complete cardiac rehab.  Recent 2D echo performed 12/16/2021 revealed normal LV systolic function, mildly reduced RV systolic function.  A well-functioning mitral bioprosthesis with trivial MR and left atrial dilatation.  This will be repeated in 1 year.  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

## 2022-04-21 LAB — LIPID PANEL
Chol/HDL Ratio: 3.6 ratio (ref 0.0–4.4)
Cholesterol, Total: 199 mg/dL (ref 100–199)
HDL: 55 mg/dL (ref >39)
LDL Chol Calc (NIH): 133 mg/dL — ABNORMAL HIGH (ref 0–99)
Triglycerides: 62 mg/dL (ref 0–149)
VLDL Cholesterol Cal: 11 mg/dL (ref 5–40)

## 2022-04-21 LAB — HEPATIC FUNCTION PANEL
ALT: 16 IU/L (ref 0–32)
AST: 25 IU/L (ref 0–40)
Albumin: 4.5 g/dL (ref 3.8–4.8)
Alkaline Phosphatase: 120 IU/L (ref 44–121)
Bilirubin Total: 0.7 mg/dL (ref 0.0–1.2)
Bilirubin, Direct: 0.22 mg/dL (ref 0.00–0.40)
Total Protein: 7.4 g/dL (ref 6.0–8.5)

## 2022-04-30 ENCOUNTER — Other Ambulatory Visit: Payer: Self-pay | Admitting: *Deleted

## 2022-04-30 DIAGNOSIS — E785 Hyperlipidemia, unspecified: Secondary | ICD-10-CM

## 2022-04-30 MED ORDER — ATORVASTATIN CALCIUM 20 MG PO TABS: 20.0000 mg | ORAL_TABLET | Freq: Every day | ORAL | 3 refills | Status: DC

## 2022-05-27 ENCOUNTER — Ambulatory Visit: Payer: Medicare HMO | Attending: Cardiology | Admitting: *Deleted

## 2022-05-27 DIAGNOSIS — Z7901 Long term (current) use of anticoagulants: Secondary | ICD-10-CM

## 2022-05-27 DIAGNOSIS — I4819 Other persistent atrial fibrillation: Secondary | ICD-10-CM | POA: Diagnosis not present

## 2022-05-27 DIAGNOSIS — Z953 Presence of xenogenic heart valve: Secondary | ICD-10-CM | POA: Diagnosis not present

## 2022-05-27 LAB — POCT INR: INR: 2.7 (ref 2.0–3.0)

## 2022-05-27 NOTE — Patient Instructions (Addendum)
Description   Continue taking warfarin 1 tablet daily. Recheck 6 weeks. Coumadin Clinic [phone]

## 2022-06-09 ENCOUNTER — Other Ambulatory Visit: Payer: Self-pay | Admitting: Family Medicine

## 2022-06-09 DIAGNOSIS — Z1231 Encounter for screening mammogram for malignant neoplasm of breast: Secondary | ICD-10-CM

## 2022-07-02 ENCOUNTER — Telehealth: Payer: Self-pay

## 2022-07-02 NOTE — Progress Notes (Signed)
Care Management & Coordination Services Pharmacy Team  Reason for Encounter: Appointment Reminder  Contacted patient on 07/02/2022   Recent office visits:  03/27/22-Javier Gutierrez,MD(PCP)- f/u HTN,flu shot given,no medication changes, f/u 6 months   Recent consult visits:  04/20/22-Jonathan Berry,MD(cardio)-f/u Afib, labs ordered,echo ordered, no medication changes,f/u 12 months 04/06/22-Rheumatology- no data  Hospital visits:  None in previous 6 months  Medications: Outpatient Encounter Medications as of 07/02/2022  Medication Sig Note   acetaminophen (TYLENOL) 325 MG tablet Take 650 mg by mouth every 6 (six) hours as needed (pain).    amLODipine (NORVASC) 10 MG tablet TAKE 1 TABLET EVERY DAY    amoxicillin (AMOXIL) 500 MG tablet Take 4 tablets (2,000 mg total) by mouth as needed (1 hour prior to dental work). (Patient not taking: Reported on 04/20/2022)    aspirin EC 81 MG tablet Take 81 mg by mouth daily. Swallow whole.    atorvastatin (LIPITOR) 20 MG tablet Take 1 tablet (20 mg total) by mouth daily.    Calcium Carb-Cholecalciferol (CALCIUM-VITAMIN D) 600-400 MG-UNIT TABS Take 1 tablet by mouth daily.    cholecalciferol (VITAMIN D) 1000 UNITS tablet Take 1,000 Units by mouth daily.    ENTRESTO 97-103 MG TAKE 1 TABLET TWICE DAILY    folic acid (FOLVITE) 1 MG tablet Take 1 tablet (1 mg total) by mouth daily.    furosemide (LASIX) 20 MG tablet TAKE 1 TABLET EVERY DAY 04/20/2022: Patient takes 1/2 tablet daily   levothyroxine (SYNTHROID) 100 MCG tablet Take 1 tablet (100 mcg total) by mouth daily before breakfast.    methotrexate (RHEUMATREX) 2.5 MG tablet Take 15 mg by mouth every Friday. Caution:Chemotherapy. Protect from light.    omeprazole (PRILOSEC) 20 MG capsule TAKE 1 CAPSULE EVERY DAY    spironolactone (ALDACTONE) 25 MG tablet Take 1 tablet (25 mg total) by mouth daily.    warfarin (COUMADIN) 4 MG tablet TAKE 1 TABLET EVERY DAY OR AS DIRECTED  BY THE COUMADIN CLINIC     Facility-Administered Encounter Medications as of 07/02/2022  Medication   0.9 %  sodium chloride infusion   Lab Results  Component Value Date/Time   HGBA1C 5.0 10/28/2020 01:27 AM   HGBA1C 5.5 11/30/2006 12:00 AM   MICROALBUR 1.6 09/15/2021 01:55 PM   MICROALBUR 37.3 (H) 09/10/2020 08:25 AM    BP Readings from Last 3 Encounters:  04/20/22 (!) 148/66  03/27/22 128/60  10/16/21 (!) 165/76    Unsuccessful attempt to reach patient. Left patient message reminding patient of appointment.  Call failure home phone, no answer mobile  Patient contacted to confirm telephone appointment with Charlene Brooke,   PharmD, on 07/07/2022 at 3:00.   Star Rating Drugs:  Medication:  Last Fill: Day Supply Atorvastatin '20mg'$  04/30/22  90 Entresto 97-'103mg'$  02/26/22  90 [employer] mail order   Care Gaps: Annual wellness visit in last year? Yes  Charlene Brooke, pharmD notified  Avel Sensor, [place]  [phone]

## 2022-07-07 ENCOUNTER — Ambulatory Visit: Payer: Medicare HMO | Attending: Cardiovascular Disease | Admitting: *Deleted

## 2022-07-07 ENCOUNTER — Other Ambulatory Visit: Payer: Self-pay | Admitting: Cardiovascular Disease

## 2022-07-07 ENCOUNTER — Ambulatory Visit: Payer: Medicare HMO | Admitting: Pharmacist

## 2022-07-07 DIAGNOSIS — I4819 Other persistent atrial fibrillation: Secondary | ICD-10-CM

## 2022-07-07 DIAGNOSIS — M0579 Rheumatoid arthritis with rheumatoid factor of multiple sites without organ or systems involvement: Secondary | ICD-10-CM | POA: Diagnosis not present

## 2022-07-07 DIAGNOSIS — Z953 Presence of xenogenic heart valve: Secondary | ICD-10-CM | POA: Diagnosis not present

## 2022-07-07 DIAGNOSIS — Z7901 Long term (current) use of anticoagulants: Secondary | ICD-10-CM | POA: Diagnosis not present

## 2022-07-07 LAB — POCT INR: INR: 3.4 — AB (ref 2.0–3.0)

## 2022-07-07 NOTE — Patient Instructions (Signed)
Visit Information  Phone number for Pharmacist: [phone]  Thank you for meeting with me to discuss your medications! Below is a summary of what we talked about during the visit:   Recommendations/Changes made from today's visit: -Advised pt to f/u with cardiology for repeat lipid panel -No med changes  Follow up plan: -Health Concierge will call patient 6 months for general update -Pharmacist follow up PRN -PCP appt 09/28/22 (CPE)   Charlene Brooke, PharmD, BCACP Clinical Pharmacist [facility] [phone]

## 2022-07-07 NOTE — Patient Instructions (Signed)
Description   Do not take any warfarin today then continue taking warfarin 1 tablet daily. Recheck 4 weeks. Coumadin Clinic [phone]

## 2022-07-07 NOTE — Progress Notes (Signed)
Care Management & Coordination Services Pharmacy Note  07/07/2022 Name:  Joanna Hood MRN:  [id number] DOB:  May 10, 1946  Summary: -HLD: LDL 133 (03/2022), LDL goal < 100 per cardiology; atorvastatin was started 04/2022 and pt is due for repeat lipid panel at 3 months (by 07/31/22) -HF/HTN: pt reports home BP 120s-130s/60s; she reports wt is stable around 135 lbs, she denies s/sx fluid overload -Mammogram is due, scheduled for 08/03/22  Recommendations/Changes made from today's visit: -Advised pt to f/u with cardiology for repeat lipid panel -No med changes  Follow up plan: -Health Concierge will call patient 6 months for general update -Pharmacist follow up PRN -PCP appt 09/28/22 (CPE)    Subjective: Joanna Hood is an 77 y.o. year old female who is a primary patient of Ria Bush, MD.  The care coordination team was consulted for assistance with disease management and care coordination needs.    Engaged with patient by telephone for follow up visit.  Recent office visits: 03/27/22 Dr Danise Mina OV: f/u - flu shot given. Schedule cardiology f/u. F/u 6 months for AWV.  Recent consult visits: 04/20/22 Dr Gwenlyn Found (Cardiology): LDL 133. Start atorvastatin and recheck FLP 3 months.  04/06/22 Leafy Kindle (Rheumatology): f/u RA  10/16/21 DR Lorenso Courier (Hematology): new pt - anemia of chronic disease.   Hospital visits: None in previous 6 months   Objective:  Lab Results  Component Value Date   CREATININE 1.04 (H) 10/16/2021   BUN 24 (H) 10/16/2021   GFR 62.24 09/15/2021   EGFR 66 10/14/2020   GFRNONAA 56 (L) 10/16/2021   GFRAA 79 (L) 05/03/2013   NA 138 10/16/2021   K 3.9 10/16/2021   CALCIUM 9.9 10/16/2021   CO2 27 10/16/2021   GLUCOSE 102 (H) 10/16/2021    Lab Results  Component Value Date/Time   HGBA1C 5.0 10/28/2020 01:27 AM   HGBA1C 5.5 11/30/2006 12:00 AM   GFR 62.24 09/15/2021 01:55 PM   GFR 60.14 01/22/2021 11:44 AM   MICROALBUR 1.6  09/15/2021 01:55 PM   MICROALBUR 37.3 (H) 09/10/2020 08:25 AM    Last diabetic Eye exam: No results found for: "HMDIABEYEEXA"  Last diabetic Foot exam: No results found for: "HMDIABFOOTEX"   Lab Results  Component Value Date   CHOL 199 04/20/2022   HDL 55 04/20/2022   LDLCALC 133 (H) 04/20/2022   TRIG 62 04/20/2022   CHOLHDL 3.6 04/20/2022       Latest Ref Rng & Units 04/20/2022    9:36 AM 10/16/2021    9:38 AM 09/15/2021    1:55 PM  Hepatic Function  Total Protein 6.0 - 8.5 g/dL 7.4  8.3  7.7   Albumin 3.8 - 4.8 g/dL 4.5  4.2  3.7   AST 0 - 40 IU/L '25  17  20   '$ ALT 0 - 32 IU/L '16  11  13   '$ Alk Phosphatase 44 - 121 IU/L 120  101  84   Total Bilirubin 0.0 - 1.2 mg/dL 0.7  0.6  0.6   Bilirubin, Direct 0.00 - 0.40 mg/dL 0.22       Lab Results  Component Value Date/Time   TSH 0.42 11/05/2021 08:03 AM   TSH 0.08 (L) 09/15/2021 01:55 PM   FREET4 1.24 11/05/2021 08:03 AM   FREET4 2.06 (H) 09/18/2021 12:43 PM       Latest Ref Rng & Units 10/16/2021    9:38 AM 09/15/2021    1:55 PM 06/09/2021    9:53 AM  CBC  WBC 4.0 - 10.5 K/uL 4.2  6.5  3.9   Hemoglobin 12.0 - 15.0 g/dL 11.3  9.7  10.8   Hematocrit 36.0 - 46.0 % 35.0  30.7  33.9   Platelets 150 - 400 K/uL 209  305.0  164    Lab Results  Component Value Date/Time   INR 3.4 (A) 07/07/2022 08:41 AM   INR 2.7 05/27/2022 08:28 AM   INR 2.0 (H) 11/07/2020 05:00 AM   INR 1.9 (H) 11/06/2020 03:43 AM    Lab Results  Component Value Date/Time   VD25OH 48.11 09/15/2021 01:55 PM   VD25OH 46.38 09/10/2020 08:25 AM   VITAMINB12 424 10/16/2021 09:38 AM   VITAMINB12 505 09/01/2013 03:12 PM    Clinical ASCVD: No  The 10-year ASCVD risk score (Arnett DK, et al., 2019) is: 18.6%   Values used to calculate the score:     Age: 77 years     Sex: Female     Is Non-Hispanic African American: Yes     Diabetic: No     Tobacco smoker: No     Systolic Blood Pressure: 951 mmHg     Is BP treated: Yes     HDL Cholesterol: 55 mg/dL      Total Cholesterol: 199 mg/dL    CHA2DS2/VAS Stroke Risk Points  Current as of about an hour ago     5 >= 2 Points: High Risk  1 - 1.99 Points: Medium Risk  0 Points: Low Risk    No Change      Points Metrics  1 Has Congestive Heart Failure:  Yes    Current as of about an hour ago  0 Has Vascular Disease:  No    Current as of about an hour ago  1 Has Hypertension:  Yes    Current as of about an hour ago  2 Age:  77    Current as of about an hour ago  0 Has Diabetes:  No    Current as of about an hour ago  0 Had Stroke:  No  Had TIA:  No  Had Thromboembolism:  No    Current as of about an hour ago  1 Female:  Yes    Current as of about an hour ago         09/05/2021    2:08 PM 03/28/2021    2:29 PM 01/28/2021   12:36 PM  Depression screen PHQ 2/9  Decreased Interest 0 0 0  Down, Depressed, Hopeless 0 0 0  PHQ - 2 Score 0 0 0     Social History   Tobacco Use  Smoking Status Former   Types: Cigarettes   Quit date: 05/16/1969   Years since quitting: 53.1  Smokeless Tobacco Former   BP Readings from Last 3 Encounters:  04/20/22 (!) 148/66  03/27/22 128/60  10/16/21 (!) 165/76   Pulse Readings from Last 3 Encounters:  04/20/22 73  03/27/22 (!) 48  10/16/21 67   Wt Readings from Last 3 Encounters:  04/20/22 132 lb 12.8 oz (60.2 kg)  03/27/22 137 lb (62.1 kg)  10/16/21 137 lb 8 oz (62.4 kg)   BMI Readings from Last 3 Encounters:  04/20/22 23.52 kg/m  03/27/22 24.86 kg/m  10/16/21 24.95 kg/m    Allergies  Allergen Reactions   Metronidazole Itching and Rash    Medications Reviewed Today     Reviewed by Charlton Haws, [facility] (Pharmacist) on 07/07/22 at 1514  Med List Status: <  None>   Medication Order Taking? Sig Documenting Provider Last Dose Status Informant  0.9 %  sodium chloride infusion [id number]   Irene Shipper, MD  Active   acetaminophen (TYLENOL) 325 MG tablet [id number] Yes Take 650 mg by mouth every 6 (six) hours as needed (pain).  [provider] Taking Active Self  amLODipine (NORVASC) 10 MG tablet [id number] Yes TAKE 1 TABLET EVERY DAY Bensimhon, Shaune Pascal, MD Taking Active   amoxicillin (AMOXIL) 500 MG tablet [id number] Yes Take 4 tablets (2,000 mg total) by mouth as needed (1 hour prior to dental work). Bensimhon, Shaune Pascal, MD Taking Active   aspirin EC 81 MG tablet [id number] Yes Take 81 mg by mouth daily. Swallow whole. [provider] Taking Active   atorvastatin (LIPITOR) 20 MG tablet [id number] Yes Take 1 tablet (20 mg total) by mouth daily. Lorretta Harp, MD Taking Active   Calcium Carb-Cholecalciferol (CALCIUM-VITAMIN D) 600-400 MG-UNIT TABS [id number] Yes Take 1 tablet by mouth daily. [provider] Taking Active Self  cholecalciferol (VITAMIN D) 1000 UNITS tablet [id number] Yes Take 1,000 Units by mouth daily. [provider] Taking Active Self  ENTRESTO 97-103 MG [id number] Yes TAKE 1 TABLET TWICE DAILY Clegg, Amy D, NP Taking Active   folic acid (FOLVITE) 1 MG tablet [id number] Yes Take 1 tablet (1 mg total) by mouth daily. Ria Bush, MD Taking Active Self  furosemide (LASIX) 20 MG tablet [id number] Yes TAKE 1 TABLET EVERY DAY Lorretta Harp, MD Taking Active   levothyroxine (SYNTHROID) 100 MCG tablet [id number] Yes Take 1 tablet (100 mcg total) by mouth daily before breakfast. Ria Bush, MD Taking Active   methotrexate [facility]) 2.5 MG tablet [id number] Yes Take 15 mg by mouth every Friday. Caution:Chemotherapy. Protect from light. [provider] Taking Active Self  omeprazole (PRILOSEC) 20 MG capsule [id number] Yes TAKE 1 CAPSULE EVERY DAY Irene Shipper, MD Taking Active   spironolactone (ALDACTONE) 25 MG tablet [id number] Yes Take 1 tablet (25 mg total) by mouth daily. Ria Bush, MD Taking Active   warfarin (COUMADIN) 4 MG tablet [id number] Yes TAKE 1 TABLET EVERY DAY OR AS DIRECTED  BY THE COUMADIN CLINIC Lorretta Harp, MD Taking Active              SDOH:  (Social Determinants of Health) assessments and interventions performed: Yes SDOH Interventions    [place] Coordination from 07/07/2022 in [place] Management from 07/07/2021 in [place] at [facility] Admission (Discharged) from 10/21/2020 in [place] from 09/04/2020 in [place] at [place] from 06/02/2019 in [place] at [place] Interventions Intervention Not Indicated -- Intervention Not Indicated -- --  Housing Interventions Intervention Not Indicated -- Intervention Not Indicated -- --  Transportation Interventions Intervention Not Indicated -- Intervention Not Indicated -- --  Depression Interventions/Treatment  -- -- -- PHQ2-9 Score <4 Follow-up Not Indicated PHQ2-9 Score <4 Follow-up Not Indicated  Financial Strain Interventions Intervention Not Indicated Intervention Not Indicated Intervention Not Indicated -- --      SDOH Screenings   Food Insecurity: No Food Insecurity (07/07/2022)  Housing: Low Risk  (07/07/2022)  Transportation Needs: No Transportation Needs (07/07/2022)  Alcohol Screen: Low Risk  (09/05/2021)  Depression (PHQ2-9): Low Risk  (09/05/2021)  Financial Resource Strain: Low Risk  (07/07/2022)  Physical Activity: Insufficiently Active (09/05/2021)  Social Connections: Moderately Integrated (  09/05/2021)  Stress: No Stress Concern Present (09/05/2021)  Tobacco Use: Medium Risk (04/20/2022)    Medication Assistance: None required.  Patient affirms current coverage meets needs.  Medication Access: Within the past 30 days, how often has patient missed a dose of medication? 0 Is a pillbox or other method used to improve adherence? Yes  Factors that may affect medication adherence? no barriers identified Are meds synced by current pharmacy? No  Are meds delivered by current  pharmacy? Yes  Does patient experience delays in picking up medications due to transportation concerns? No   [facility] Reviewed: Is patient disadvantaged to use [facility]?: Yes  Current Rx insurance plan: [employer] Name and location of Current pharmacy:  [facility] - [place], [address] [place] [address] Phone: [phone] Fax: [place], [address] AT [address] [place] [address] Phone: [phone] Fax: [phone]  [facility] services reviewed with patient today?: No  Patient requests to transfer care to [facility] Pharmacy?: No  Reason patient declined to change pharmacies: Disadvantaged due to insurance/mail order  Compliance/Adherence/Medication fill history: Care Gaps: Mammogram - scheduled 08/03/22  Star-Rating Drugs: Atorvastatin - PDC 100%   Assessment/Plan  Atrial Fibrillation (Goal: prevent stroke and major bleeding) -Controlled -  followed by cardiology -On warfarin S/P mitral valve replacement with bioprosthetic valve. INR monitored by cardiology.  -CHADSVASC: 5 -Current treatment: Warfarin 4 mg - as directed -Appropriate, Effective, Safe, Accessible -Medications previously tried: Eliquis (prior to valve replacement), diltiazem, meetoprolol -Home BP and HR readings: see HTN section  -Denies any abnormal bruising/bleeding. -Counseled on increased risk of stroke due to Afib and benefits of anticoagulation for stroke prevention; -Recommended to continue current medication  Heart Failure / Hypertension (Goal: BP < 140/90) -Controlled, followed by cardiology. Reports she used to have SOB and swelling, but none in a long time. Symptoms controlled. 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%) -NYHA Class: II (slight limitation of activity) -AHA HF Stage: C (Heart disease and symptoms present) -Current treatment: Furosemide 20 mgdaily -Appropriate, Effective, Safe, Accessible Entresto 97-103 mg BID -Appropriate, Effective, Safe, Accessible Spironolactone 25 mg daily -Appropriate, Effective, Safe, Accessible Amlodipine 10 mg daily - Appropriate, Effective, Safe, Accessible -Medications previously tried: metoprolol, ramipril,    -Educated on Importance of weighing daily; if you gain more than 3 pounds in one day or 5 pounds in one week, contact cardiology -Recommended to continue current medication  Hyperlipidemia: (LDL goal < 100) -Query controlled - LDL 133 (03/2022), atorvastatin was started after that and recommended 33-monthrepeat lipid panel -Current treatment: Atorvastatin 20 mg daily - Appropriate, Effective, Safe, Accessible -Medications previously tried: none  -Educated on Cholesterol goals; Benefits of statin for ASCVD risk reduction; -Recommended to continue current medication  Osteopenia (Goal: Prevent fractures and improve bone density) -Controlled, on appropriate therapy. No recent falls. -Last DEXA Scan: 08/2019 >> 02/2022  T-Score femoral neck: -2.0 > -2.3  T-Score total hip: 1.0 > -1.4  T-Score forearm radius: -2.4  10-year probability of major osteoporotic fracture: 9.4%  10-year probability of hip fracture: 2.9% -Patient is not a candidate for pharmacologic treatment -Current treatment  Vitamin D3 1000 IU daily  Appropriate, Effective, Safe, Accessible Calcium-Vitamin D 600-400 mg daily Appropriate, Effective, Safe, Accessible -Medications previously  tried: none   -Recommend weight-bearing and muscle strengthening exercises for building and maintaining bone density. -Recommended to continue current medication  Hypothyroidism (Goal: TSH, T4 WNL) -Controlled - TSH 0.42 (10/2021) at goal -Current treatment  Levothyroxine 100 mcg  daily -Appropriate, Effective, Safe, Accessible -Medications previously tried: none reported  -Recommended to continue current medication  Other -  - She reports seeing rheumatologist for RA every 6 months. Reports medications work well. She continues methotrexate 15 mg weekly and folic acid 1 mg daily. - She reports taking omeprazole 20 mg 1 capsule daily. No GERD history. She denies symptoms of acid reflux. Reports PPI started by cardiology for GI protection.   Charlene Brooke, PharmD, BCACP Clinical Pharmacist [facility] [phone]

## 2022-08-03 ENCOUNTER — Ambulatory Visit
Admission: RE | Admit: 2022-08-03 | Discharge: 2022-08-03 | Disposition: A | Discharge status: Home / Self Care | Payer: Medicare HMO | Source: Ambulatory Visit | Attending: Family Medicine | Admitting: Family Medicine

## 2022-08-03 DIAGNOSIS — Z1231 Encounter for screening mammogram for malignant neoplasm of breast: Secondary | ICD-10-CM | POA: Diagnosis not present

## 2022-08-04 ENCOUNTER — Ambulatory Visit: Payer: Medicare HMO | Attending: Cardiovascular Disease | Admitting: *Deleted

## 2022-08-04 DIAGNOSIS — Z953 Presence of xenogenic heart valve: Secondary | ICD-10-CM | POA: Diagnosis not present

## 2022-08-04 DIAGNOSIS — Z5181 Encounter for therapeutic drug level monitoring: Secondary | ICD-10-CM | POA: Diagnosis not present

## 2022-08-04 DIAGNOSIS — I4819 Other persistent atrial fibrillation: Secondary | ICD-10-CM | POA: Diagnosis not present

## 2022-08-04 LAB — POCT INR: POC INR: 2

## 2022-08-04 NOTE — Patient Instructions (Signed)
Description   Continue taking warfarin 1 tablet daily. Recheck 5 weeks. Coumadin Clinic [phone]

## 2022-08-12 IMAGING — DX DG CHEST 1V PORT
1 series · 1 of 1 positions shown · non-contrast
Comparison: 10/30/2020

CLINICAL DATA: Sore chest, postop

EXAM:
PORTABLE CHEST 1 VIEW

[chest ap]
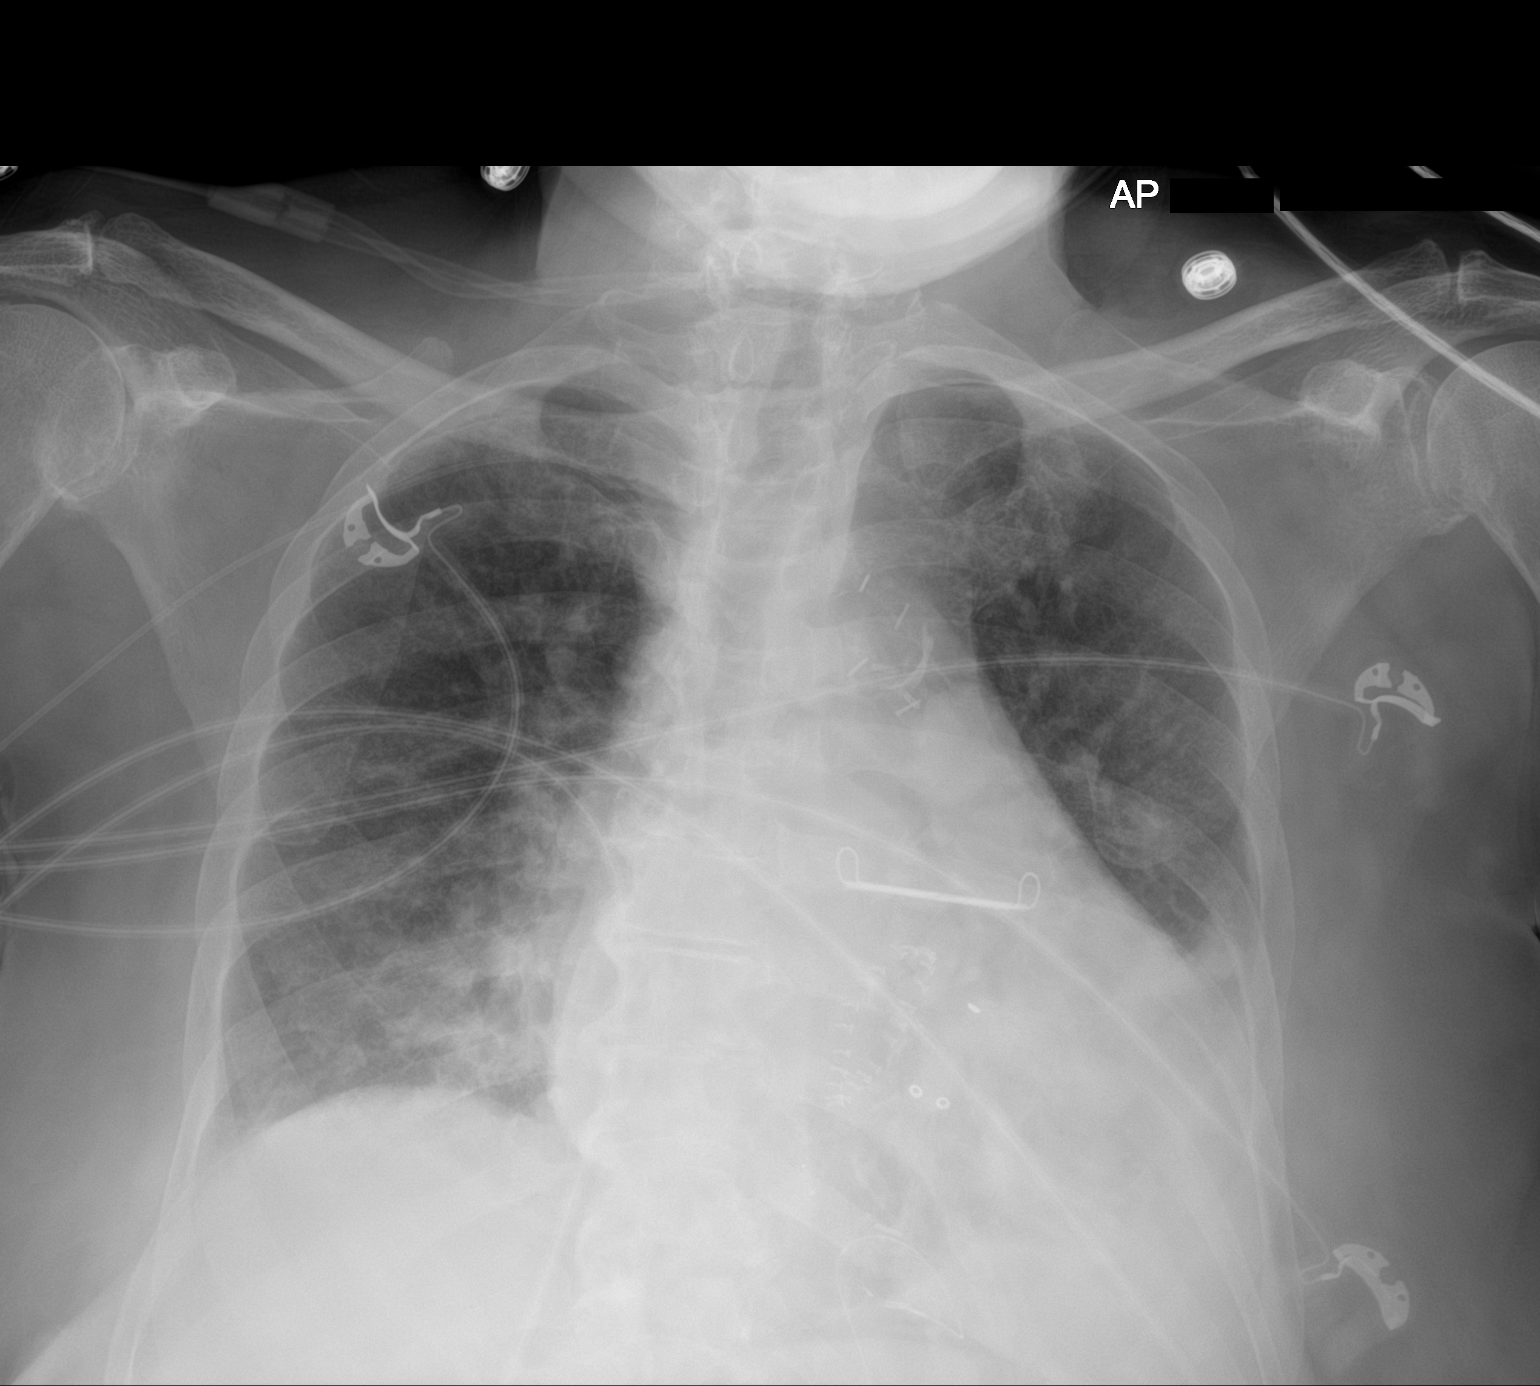

[1 of 1 positions shown; findings below may reference images not displayed]

FINDINGS: Heart is borderline in size. Vascular congestion. Left base
atelectasis or infiltrate, increasing since prior study. Minimal
right base atelectasis. Possible small left effusion. No
pneumothorax.
IMPRESSION: Worsening aeration at the left base could reflect atelectasis or
infiltrate.

Vascular congestion.

## 2022-08-14 IMAGING — DX DG CHEST 1V PORT
1 series · 1 of 1 positions shown · non-contrast
Comparison: 10/31/2020

CLINICAL DATA: Status post mitral valve repair with clipping of the
left atrial appendage.

EXAM:
PORTABLE CHEST 1 VIEW

[chest ap]
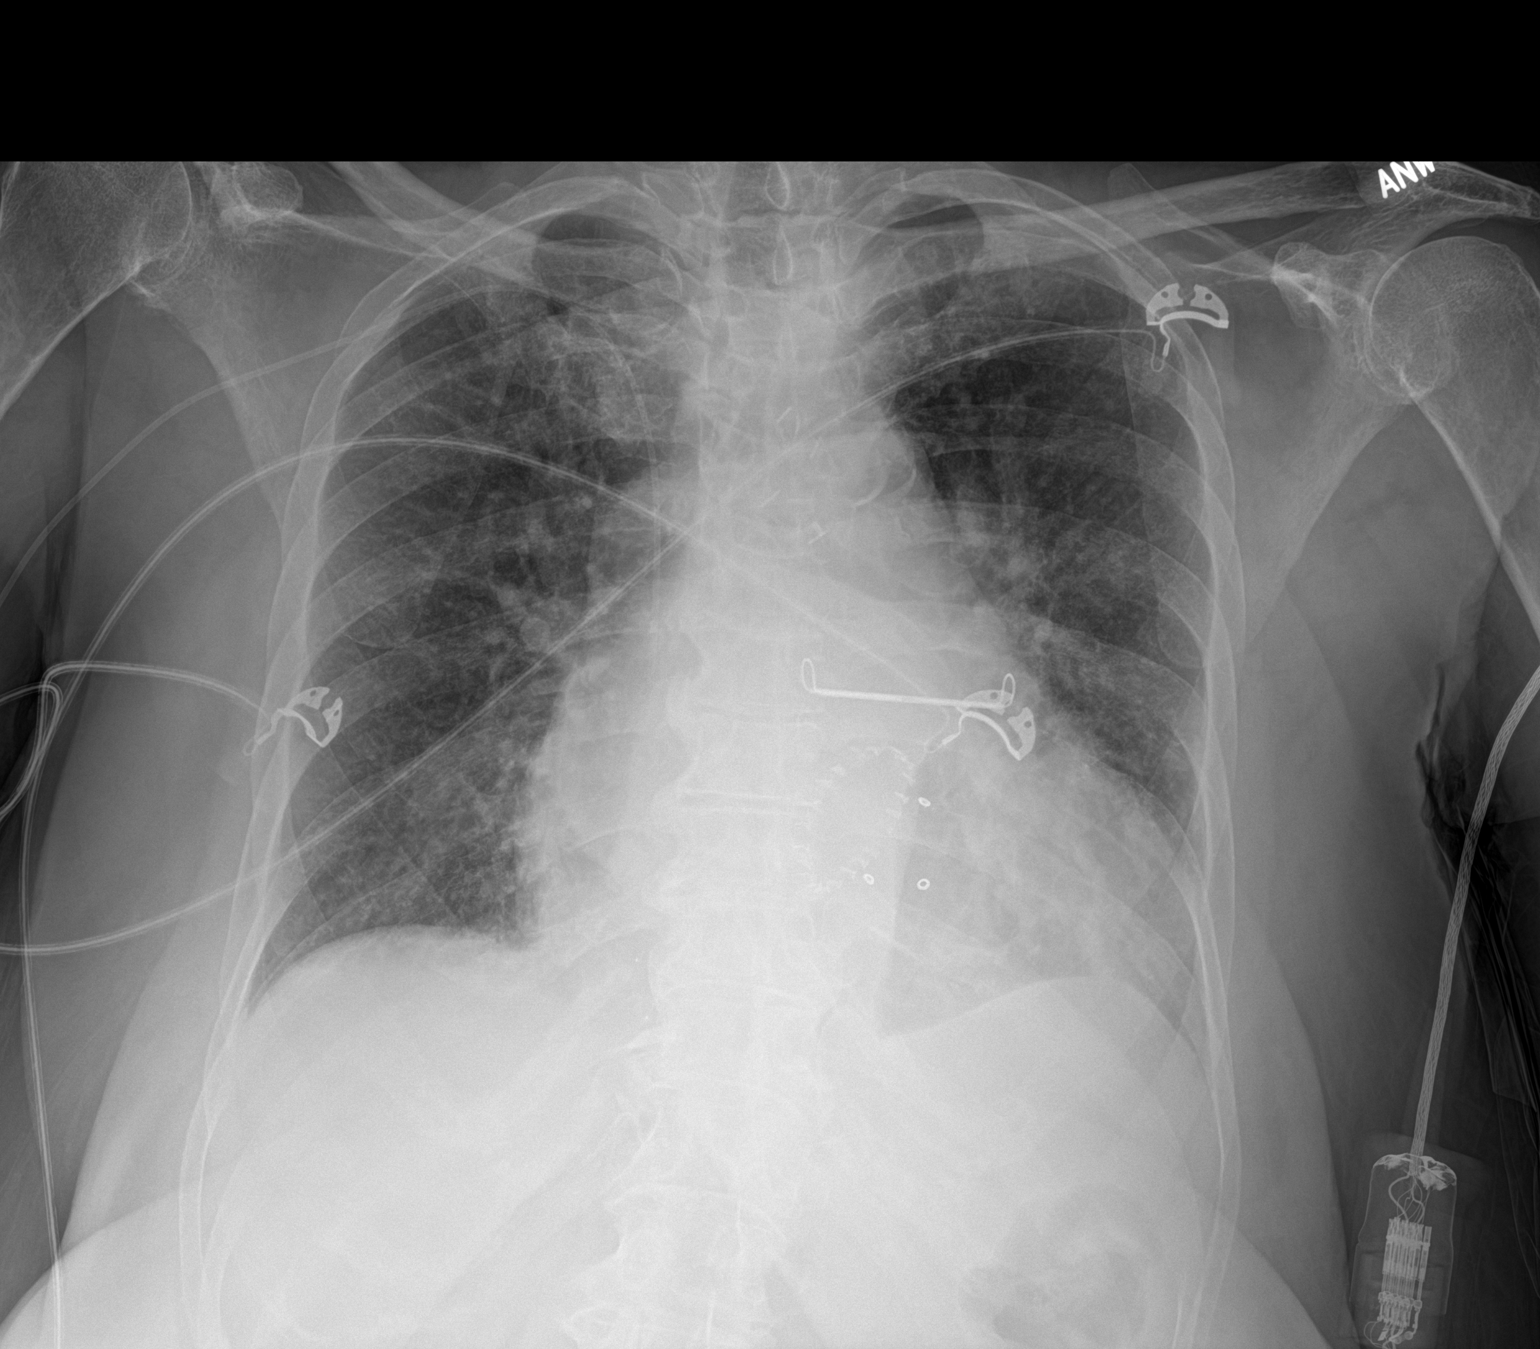

[1 of 1 positions shown; findings below may reference images not displayed]

FINDINGS: Right arm PICC line tip projects over the distal SVC. Prosthetic
mitral valve and left atrial appendage clip are again noted. Stable
cardiac enlargement. Interval improved aeration to the left lung
base which may reflect resolving atelectasis and or pleural
effusion. No new findings.
IMPRESSION: Interval improved aeration to the left lung base.

## 2022-08-15 IMAGING — CR DG CHEST 2V
2 series · 2 of 2 positions shown · non-contrast
Comparison: 11/02/2020

CLINICAL DATA: History of open heart surgery.

EXAM:
CHEST - 2 VIEW

[chest pa]
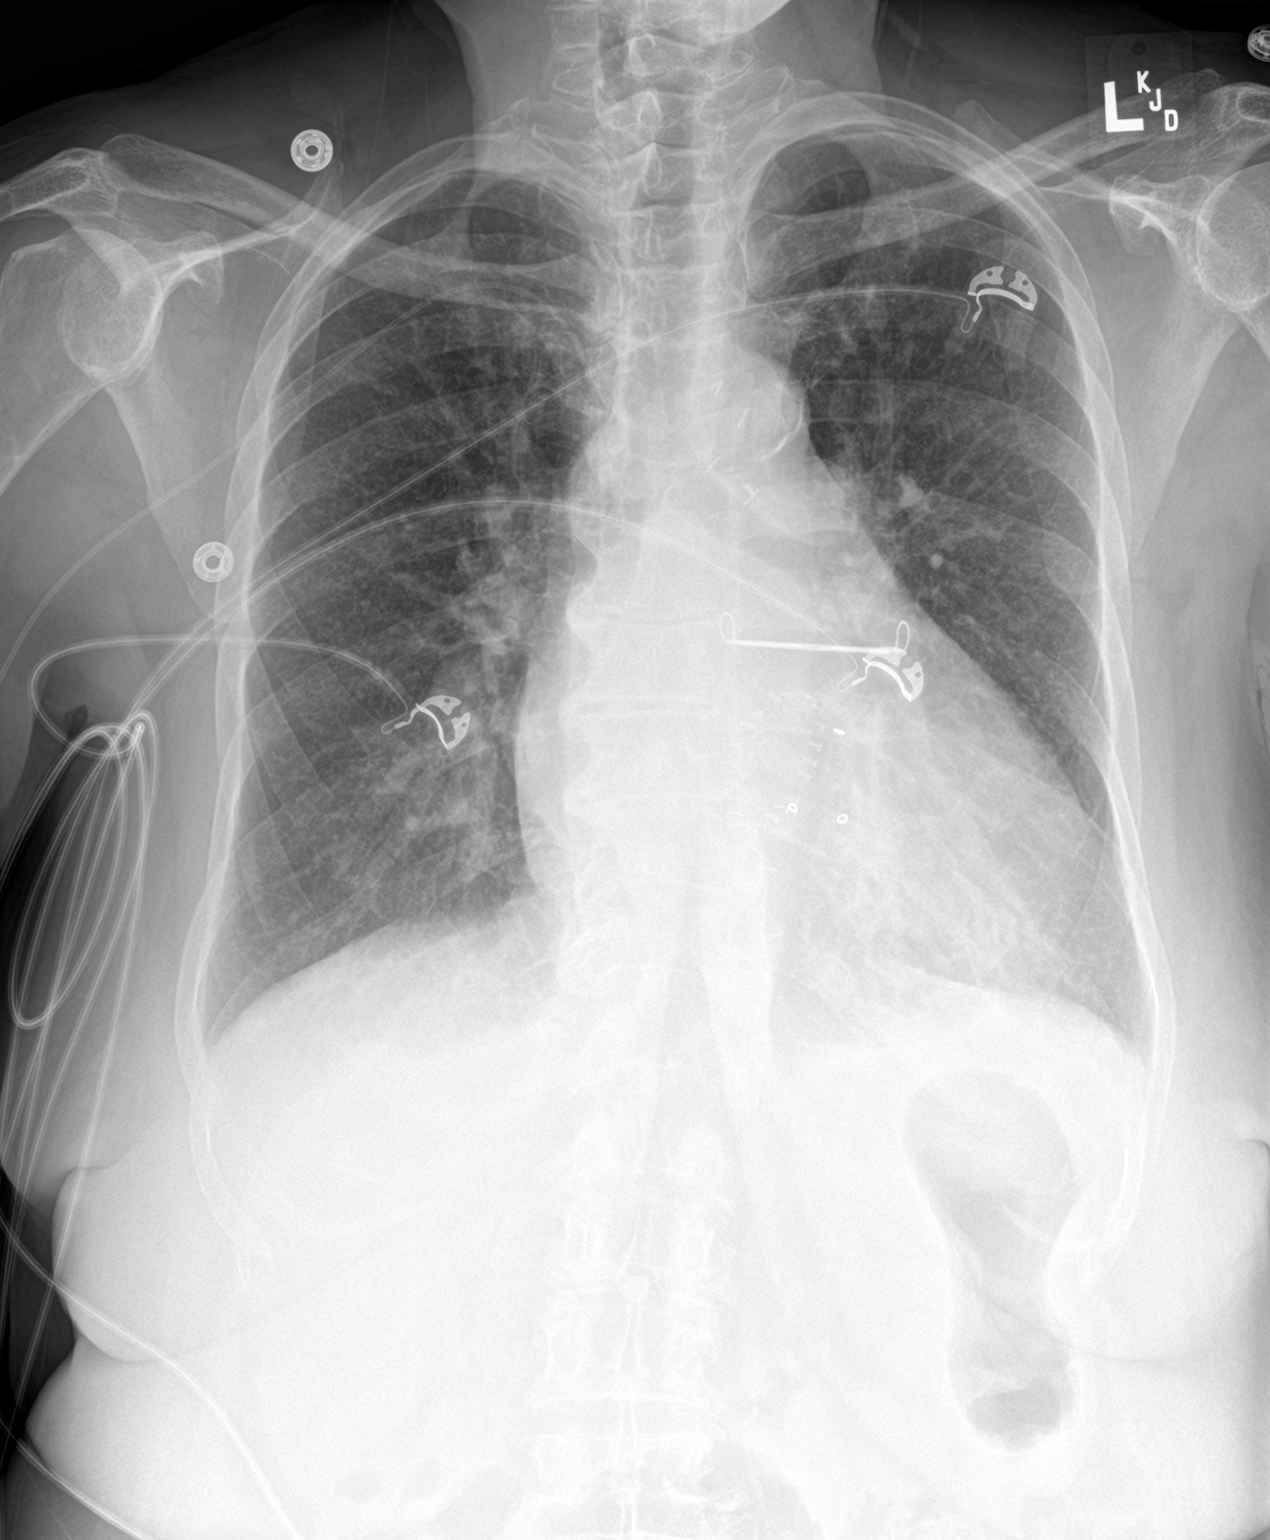

[chest lat]
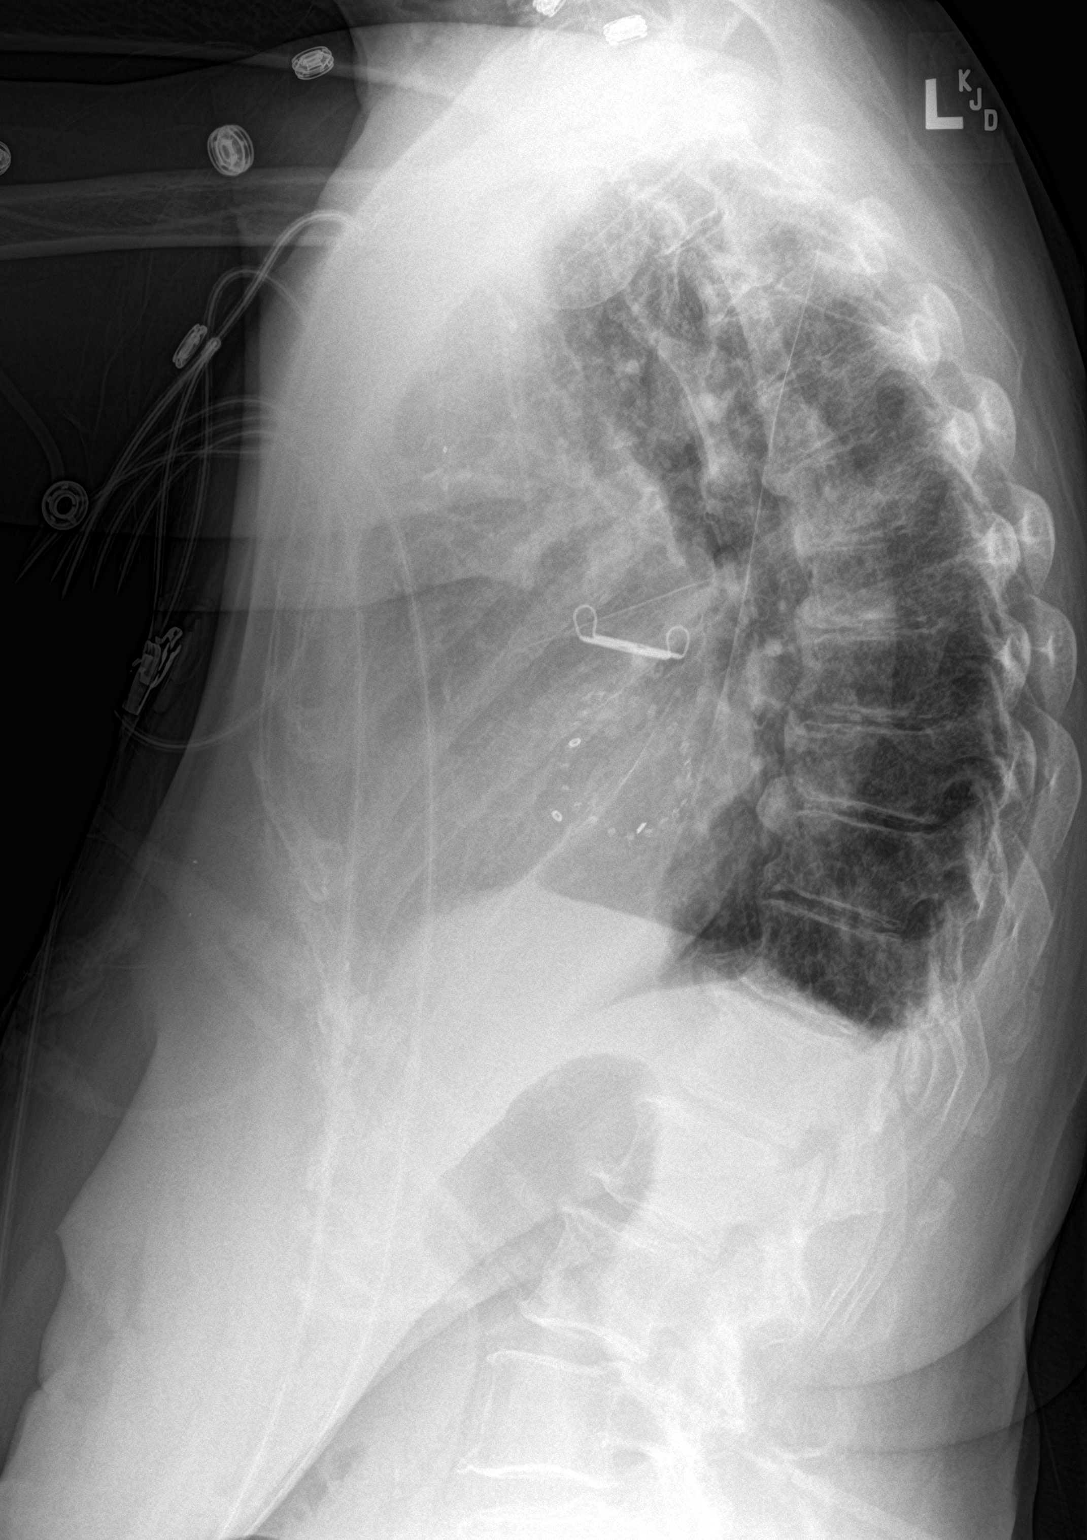

[2 of 2 positions shown; findings below may reference images not displayed]

FINDINGS: RIGHT-sided PICC line tip overlies the superior vena cava. LEFT
atrial appendage clip stable in appearance. Prior annuloplasty of
the mitral valve. Heart is enlarged and stable in configuration.
Mildly prominent interstitial markings are stable. There is LEFT
LOWER lobe atelectasis. No new consolidations. Small bilateral
pleural effusions are present.
IMPRESSION: Stable cardiomegaly.

Stable LEFT LOWER lobe atelectasis.

## 2022-08-24 ENCOUNTER — Telehealth: Payer: Self-pay | Admitting: Family Medicine

## 2022-08-24 NOTE — Telephone Encounter (Signed)
Contacted Joanna Hood to schedule their annual wellness visit. Appointment made for 09/30/2022.  [place] Direct Dial: [phone]

## 2022-09-02 ENCOUNTER — Other Ambulatory Visit: Payer: Self-pay | Admitting: Cardiovascular Disease

## 2022-09-02 DIAGNOSIS — I4819 Other persistent atrial fibrillation: Secondary | ICD-10-CM

## 2022-09-06 IMAGING — CR DG CHEST 2V
2 series · 2 of 2 positions shown · non-contrast
Comparison: November 03, 2020

CLINICAL DATA: Status post mitral valve replacement

EXAM:
CHEST - 2 VIEW

[w chest pa]
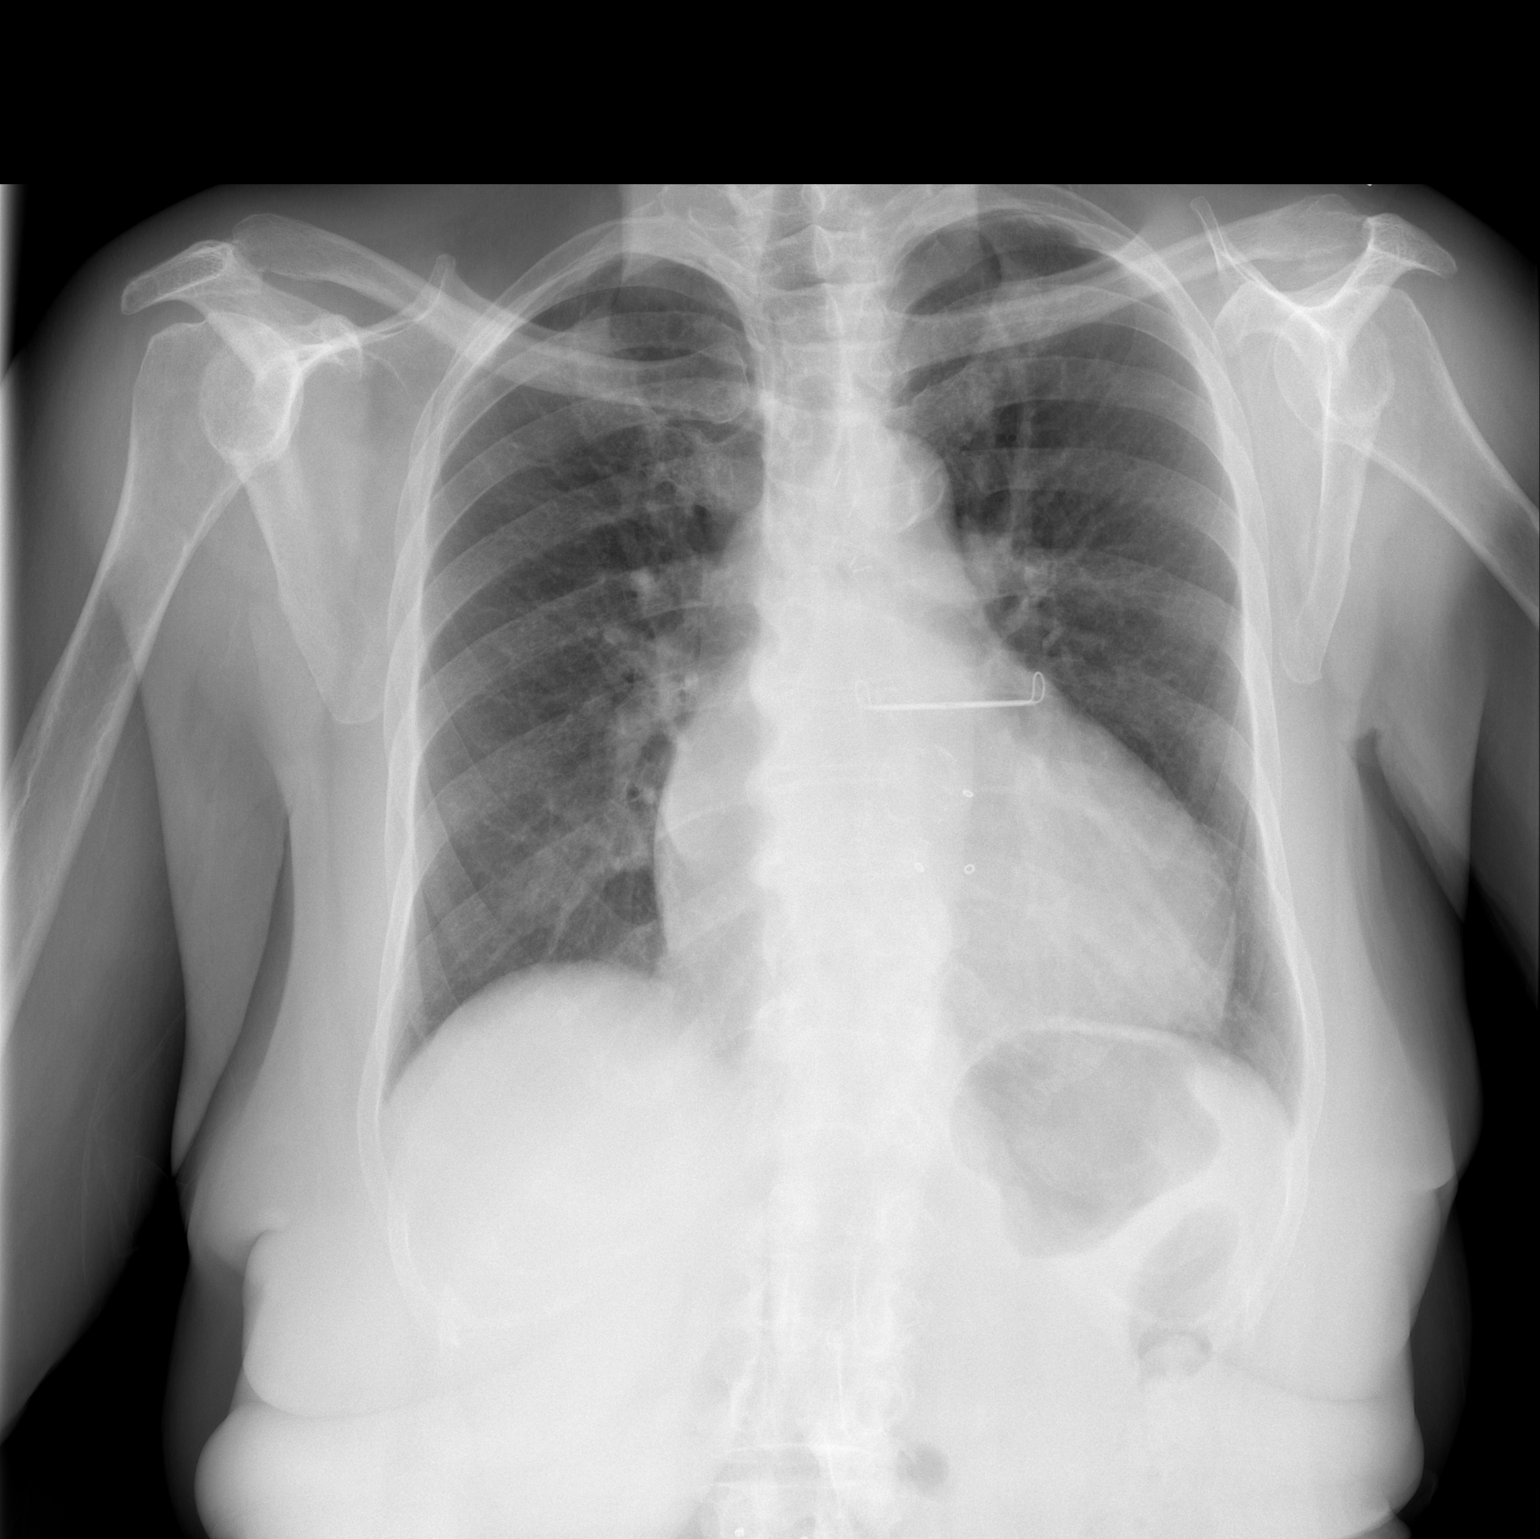

[w chest lat]
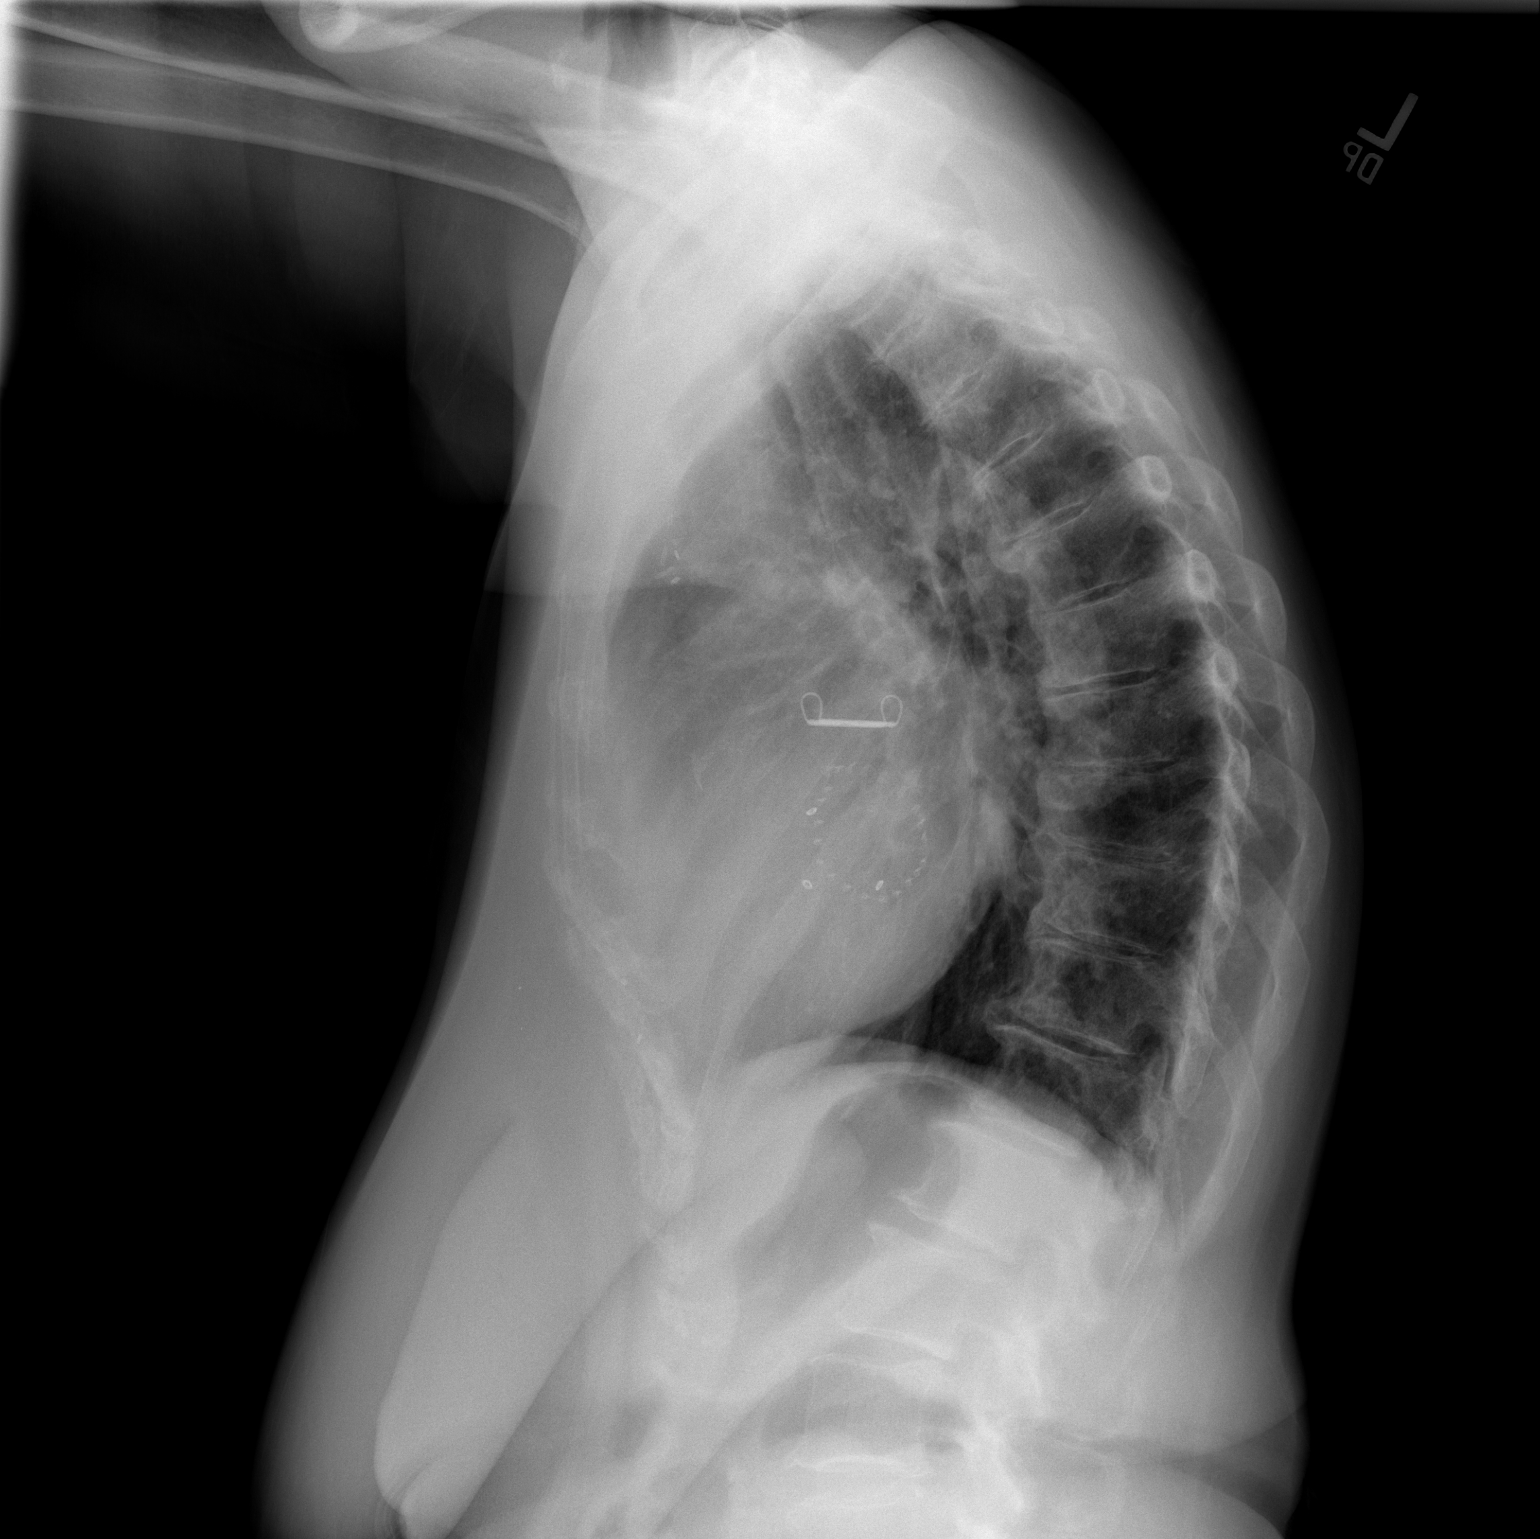

[2 of 2 positions shown; findings below may reference images not displayed]

FINDINGS: Lungs are clear. Heart is enlarged with pulmonary vascularity
normal. Patient is status post mitral valve replacement. There is a
left atrial appendage clamp. There is aortic atherosclerosis. No
adenopathy. There are foci of degenerative change in the thoracic
spine. No pneumothorax.
IMPRESSION: Lungs clear. Cardiomegaly with evidence of prior mitral valve
replacement and left atrial appendage clamp placement.

Aortic Atherosclerosis ([id number]).

## 2022-09-07 ENCOUNTER — Telehealth: Payer: Self-pay | Admitting: Family Medicine

## 2022-09-07 MED ORDER — AMOXICILLIN 500 MG PO TABS: 2000.0000 mg | ORAL_TABLET | ORAL | 3 refills | Status: AC | PRN

## 2022-09-07 NOTE — Telephone Encounter (Signed)
Plz notify this was sent in for her.

## 2022-09-07 NOTE — Telephone Encounter (Signed)
Patient notified as instructed by telephone and verbalized understanding. 

## 2022-09-07 NOTE — Telephone Encounter (Signed)
Patient id having dental work done on next week,and will need amoxicillin called in for her before .She would like to know if Dr Darnell Level can send in the rx for her? [facility] [id number] [place], [address] AT [address] [place] Phone: [phone]  Fax: [phone]

## 2022-09-08 ENCOUNTER — Ambulatory Visit: Payer: Medicare HMO | Attending: Cardiovascular Disease | Admitting: *Deleted

## 2022-09-08 DIAGNOSIS — Z7901 Long term (current) use of anticoagulants: Secondary | ICD-10-CM | POA: Diagnosis not present

## 2022-09-08 DIAGNOSIS — I4819 Other persistent atrial fibrillation: Secondary | ICD-10-CM

## 2022-09-08 DIAGNOSIS — Z953 Presence of xenogenic heart valve: Secondary | ICD-10-CM

## 2022-09-08 LAB — POCT INR: INR: 2.4 (ref 2.0–3.0)

## 2022-09-08 NOTE — Patient Instructions (Signed)
Description   Continue taking warfarin 1 tablet daily. Recheck 6 weeks. Coumadin Clinic [phone]       

## 2022-09-15 ENCOUNTER — Other Ambulatory Visit: Payer: Self-pay | Admitting: Cardiovascular Disease

## 2022-09-15 DIAGNOSIS — E785 Hyperlipidemia, unspecified: Secondary | ICD-10-CM

## 2022-09-17 ENCOUNTER — Other Ambulatory Visit: Payer: Self-pay | Admitting: Family Medicine

## 2022-09-17 ENCOUNTER — Other Ambulatory Visit (HOSPITAL_COMMUNITY): Payer: Self-pay | Admitting: Adult Health

## 2022-09-17 DIAGNOSIS — I10 Essential (primary) hypertension: Secondary | ICD-10-CM

## 2022-09-21 ENCOUNTER — Other Ambulatory Visit: Payer: Medicare HMO

## 2022-09-21 ENCOUNTER — Other Ambulatory Visit: Payer: Self-pay | Admitting: Internal Medicine

## 2022-09-28 ENCOUNTER — Encounter: Payer: Medicare HMO | Admitting: Family Medicine

## 2022-09-29 ENCOUNTER — Other Ambulatory Visit: Payer: Self-pay | Admitting: Family Medicine

## 2022-09-29 DIAGNOSIS — E559 Vitamin D deficiency, unspecified: Secondary | ICD-10-CM

## 2022-09-29 DIAGNOSIS — N289 Disorder of kidney and ureter, unspecified: Secondary | ICD-10-CM

## 2022-09-29 DIAGNOSIS — M85851 Other specified disorders of bone density and structure, right thigh: Secondary | ICD-10-CM

## 2022-09-29 DIAGNOSIS — E039 Hypothyroidism, unspecified: Secondary | ICD-10-CM

## 2022-09-29 DIAGNOSIS — D638 Anemia in other chronic diseases classified elsewhere: Secondary | ICD-10-CM

## 2022-09-30 ENCOUNTER — Ambulatory Visit (INDEPENDENT_AMBULATORY_CARE_PROVIDER_SITE_OTHER): Payer: Medicare HMO

## 2022-09-30 ENCOUNTER — Other Ambulatory Visit (INDEPENDENT_AMBULATORY_CARE_PROVIDER_SITE_OTHER): Payer: Medicare HMO

## 2022-09-30 ENCOUNTER — Other Ambulatory Visit: Payer: Medicare HMO

## 2022-09-30 VITALS — Ht 62.0 in | Wt 131.0 lb

## 2022-09-30 DIAGNOSIS — E559 Vitamin D deficiency, unspecified: Secondary | ICD-10-CM

## 2022-09-30 DIAGNOSIS — M85851 Other specified disorders of bone density and structure, right thigh: Secondary | ICD-10-CM | POA: Diagnosis not present

## 2022-09-30 DIAGNOSIS — Z0000 Encounter for general adult medical examination without abnormal findings: Secondary | ICD-10-CM

## 2022-09-30 DIAGNOSIS — D638 Anemia in other chronic diseases classified elsewhere: Secondary | ICD-10-CM | POA: Diagnosis not present

## 2022-09-30 DIAGNOSIS — E039 Hypothyroidism, unspecified: Secondary | ICD-10-CM

## 2022-09-30 DIAGNOSIS — N289 Disorder of kidney and ureter, unspecified: Secondary | ICD-10-CM | POA: Diagnosis not present

## 2022-09-30 LAB — COMPREHENSIVE METABOLIC PANEL
ALT: 12 U/L (ref 0–35)
AST: 18 U/L (ref 0–37)
Albumin: 4.4 g/dL (ref 3.5–5.2)
Alkaline Phosphatase: 115 U/L (ref 39–117)
BUN: 26 mg/dL — ABNORMAL HIGH (ref 6–23)
CO2: 25 mEq/L (ref 19–32)
Calcium: 9.6 mg/dL (ref 8.4–10.5)
Chloride: 104 mEq/L (ref 96–112)
Creatinine, Ser: 1.12 mg/dL (ref 0.40–1.20)
GFR: 48.16 mL/min — ABNORMAL LOW (ref >60.00)
Glucose, Bld: 101 mg/dL — ABNORMAL HIGH (ref 70–99)
Potassium: 3.8 mEq/L (ref 3.5–5.1)
Sodium: 140 mEq/L (ref 135–145)
Total Bilirubin: 0.6 mg/dL (ref 0.2–1.2)
Total Protein: 7.7 g/dL (ref 6.0–8.3)

## 2022-09-30 LAB — LIPID PANEL
Cholesterol: 168 mg/dL (ref 0–200)
HDL: 56.8 mg/dL (ref >39.00)
LDL Cholesterol: 100 mg/dL — ABNORMAL HIGH (ref 0–99)
NonHDL: 111.18
Total CHOL/HDL Ratio: 3
Triglycerides: 55 mg/dL (ref 0.0–149.0)
VLDL: 11 mg/dL (ref 0.0–40.0)

## 2022-09-30 LAB — CBC WITH DIFFERENTIAL/PLATELET
Basophils Absolute: 0 10*3/uL (ref 0.0–0.1)
Basophils Relative: 0.7 % (ref 0.0–3.0)
Eosinophils Absolute: 0.1 10*3/uL (ref 0.0–0.7)
Eosinophils Relative: 1.5 % (ref 0.0–5.0)
HCT: 34.7 % — ABNORMAL LOW (ref 36.0–46.0)
Hemoglobin: 11 g/dL — ABNORMAL LOW (ref 12.0–15.0)
Lymphocytes Relative: 19.5 % (ref 12.0–46.0)
Lymphs Abs: 1.1 10*3/uL (ref 0.7–4.0)
MCHC: 31.7 g/dL (ref 30.0–36.0)
MCV: 80.4 fl (ref 78.0–100.0)
Monocytes Absolute: 0.5 10*3/uL (ref 0.1–1.0)
Monocytes Relative: 8 % (ref 3.0–12.0)
Neutro Abs: 4 10*3/uL (ref 1.4–7.7)
Neutrophils Relative %: 70.3 % (ref 43.0–77.0)
Platelets: 191 10*3/uL (ref 150.0–400.0)
RBC: 4.32 Mil/uL (ref 3.87–5.11)
RDW: 16.9 % — ABNORMAL HIGH (ref 11.5–15.5)
WBC: 5.6 10*3/uL (ref 4.0–10.5)

## 2022-09-30 LAB — TSH: TSH: 0.45 u[IU]/mL (ref 0.35–5.50)

## 2022-09-30 LAB — MICROALBUMIN / CREATININE URINE RATIO
Creatinine,U: 17 mg/dL
Microalb Creat Ratio: 7.9 mg/g (ref 0.0–30.0)
Microalb, Ur: 1.3 mg/dL (ref 0.0–1.9)

## 2022-09-30 LAB — VITAMIN D 25 HYDROXY (VIT D DEFICIENCY, FRACTURES): VITD: 41.49 ng/mL (ref 30.00–100.00)

## 2022-09-30 NOTE — Patient Instructions (Signed)
Joanna Hood , Thank you for taking time to come for your Medicare Wellness Visit. I appreciate your ongoing commitment to your health goals. Please review the following plan we discussed and let me know if I can assist you in the future.   These are the goals we discussed:  Goals      DIET - INCREASE WATER INTAKE     Starting 05/26/2018, I will continue to drink at least 6-8 glasses of water daily.      Patient Stated     06/02/2019, I will try to lose some weight in the future.      Patient Stated     09/04/2020, I will maintain and continue medications as prescribed.      Patient Stated     Would like to drink more water      Patient Stated     Exercise more and stay mobile.        This is a list of the screening recommended for you and due dates:  Health Maintenance  Topic Date Due   COVID-19 Vaccine (5 - 2023-24 season) 02/20/2022   Flu Shot  01/21/2023   Mammogram  08/04/2023   Medicare Annual Wellness Visit  09/30/2023   DTaP/Tdap/Td vaccine (4 - Td or Tdap) 11/02/2027   Pneumonia Vaccine  Completed   DEXA scan (bone density measurement)  Completed   Hepatitis C Screening: USPSTF Recommendation to screen - Ages 49-79 yo.  Completed   Zoster (Shingles) Vaccine  Completed   HPV Vaccine  Aged Out   Colon Cancer Screening  Discontinued    Advanced directives: Advance directive discussed with you today. Even though you declined this today, please call our office should you change your mind, and we can give you the proper paperwork for you to fill out.   Conditions/risks identified: none  Next appointment: Follow up in one year for your annual wellness visit 10/04/23 @ 10:00 televisit   Preventive Care 65 Years and Older, Female Preventive care refers to lifestyle choices and visits with your health care provider that can promote health and wellness. What does preventive care include? A yearly physical exam. This is also called an annual well check. Dental exams once  or twice a year. Routine eye exams. Ask your health care provider how often you should have your eyes checked. Personal lifestyle choices, including: Daily care of your teeth and gums. Regular physical activity. Eating a healthy diet. Avoiding tobacco and drug use. Limiting alcohol use. Practicing safe sex. Taking low-dose aspirin every day. Taking vitamin and mineral supplements as recommended by your health care provider. What happens during an annual well check? The services and screenings done by your health care provider during your annual well check will depend on your age, overall health, lifestyle risk factors, and family history of disease. Counseling  Your health care provider may ask you questions about your: Alcohol use. Tobacco use. Drug use. Emotional well-being. Home and relationship well-being. Sexual activity. Eating habits. History of falls. Memory and ability to understand (cognition). Work and work Astronomer. Reproductive health. Screening  You may have the following tests or measurements: Height, weight, and BMI. Blood pressure. Lipid and cholesterol levels. These may be checked every 5 years, or more frequently if you are over 37 years old. Skin check. Lung cancer screening. You may have this screening every year starting at age 65 if you have a 30-pack-year history of smoking and currently smoke or have quit within the past 15 years.  Fecal occult blood test (FOBT) of the stool. You may have this test every year starting at age 84. Flexible sigmoidoscopy or colonoscopy. You may have a sigmoidoscopy every 5 years or a colonoscopy every 10 years starting at age 12. Hepatitis C blood test. Hepatitis B blood test. Sexually transmitted disease (STD) testing. Diabetes screening. This is done by checking your blood sugar (glucose) after you have not eaten for a while (fasting). You may have this done every 1-3 years. Bone density scan. This is done to screen  for osteoporosis. You may have this done starting at age 59. Mammogram. This may be done every 1-2 years. Talk to your health care provider about how often you should have regular mammograms. Talk with your health care provider about your test results, treatment options, and if necessary, the need for more tests. Vaccines  Your health care provider may recommend certain vaccines, such as: Influenza vaccine. This is recommended every year. Tetanus, diphtheria, and acellular pertussis (Tdap, Td) vaccine. You may need a Td booster every 10 years. Zoster vaccine. You may need this after age 15. Pneumococcal 13-valent conjugate (PCV13) vaccine. One dose is recommended after age 59. Pneumococcal polysaccharide (PPSV23) vaccine. One dose is recommended after age 9. Talk to your health care provider about which screenings and vaccines you need and how often you need them. This information is not intended to replace advice given to you by your health care provider. Make sure you discuss any questions you have with your health care provider. Document Released: 07/05/2015 Document Revised: 02/26/2016 Document Reviewed: 04/09/2015 Elsevier Interactive Patient Education  2017 [employer].  Fall Prevention in the Home Falls can cause injuries. They can happen to people of all ages. There are many things you can do to make your home safe and to help prevent falls. What can I do on the outside of my home? Regularly fix the edges of walkways and driveways and fix any cracks. Remove anything that might make you trip as you walk through a door, such as a raised step or threshold. Trim any bushes or trees on the path to your home. Use bright outdoor lighting. Clear any walking paths of anything that might make someone trip, such as rocks or tools. Regularly check to see if handrails are loose or broken. Make sure that both sides of any steps have handrails. Any raised decks and porches should have guardrails  on the edges. Have any leaves, snow, or ice cleared regularly. Use sand or salt on walking paths during winter. Clean up any spills in your garage right away. This includes oil or grease spills. What can I do in the bathroom? Use night lights. Install grab bars by the toilet and in the tub and shower. Do not use towel bars as grab bars. Use non-skid mats or decals in the tub or shower. If you need to sit down in the shower, use a plastic, non-slip stool. Keep the floor dry. Clean up any water that spills on the floor as soon as it happens. Remove soap buildup in the tub or shower regularly. Attach bath mats securely with double-sided non-slip rug tape. Do not have throw rugs and other things on the floor that can make you trip. What can I do in the bedroom? Use night lights. Make sure that you have a light by your bed that is easy to reach. Do not use any sheets or blankets that are too big for your bed. They should not hang down onto  the floor. Have a firm chair that has side arms. You can use this for support while you get dressed. Do not have throw rugs and other things on the floor that can make you trip. What can I do in the kitchen? Clean up any spills right away. Avoid walking on wet floors. Keep items that you use a lot in easy-to-reach places. If you need to reach something above you, use a strong step stool that has a grab bar. Keep electrical cords out of the way. Do not use floor polish or wax that makes floors slippery. If you must use wax, use non-skid floor wax. Do not have throw rugs and other things on the floor that can make you trip. What can I do with my stairs? Do not leave any items on the stairs. Make sure that there are handrails on both sides of the stairs and use them. Fix handrails that are broken or loose. Make sure that handrails are as long as the stairways. Check any carpeting to make sure that it is firmly attached to the stairs. Fix any carpet that is  loose or worn. Avoid having throw rugs at the top or bottom of the stairs. If you do have throw rugs, attach them to the floor with carpet tape. Make sure that you have a light switch at the top of the stairs and the bottom of the stairs. If you do not have them, ask someone to add them for you. What else can I do to help prevent falls? Wear shoes that: Do not have high heels. Have rubber bottoms. Are comfortable and fit you well. Are closed at the toe. Do not wear sandals. If you use a stepladder: Make sure that it is fully opened. Do not climb a closed stepladder. Make sure that both sides of the stepladder are locked into place. Ask someone to hold it for you, if possible. Clearly mark and make sure that you can see: Any grab bars or handrails. First and last steps. Where the edge of each step is. Use tools that help you move around (mobility aids) if they are needed. These include: Canes. Walkers. Scooters. Crutches. Turn on the lights when you go into a dark area. Replace any light bulbs as soon as they burn out. Set up your furniture so you have a clear path. Avoid moving your furniture around. If any of your floors are uneven, fix them. If there are any pets around you, be aware of where they are. Review your medicines with your doctor. Some medicines can make you feel dizzy. This can increase your chance of falling. Ask your doctor what other things that you can do to help prevent falls. This information is not intended to replace advice given to you by your health care provider. Make sure you discuss any questions you have with your health care provider. Document Released: 04/04/2009 Document Revised: 11/14/2015 Document Reviewed: 07/13/2014 Elsevier Interactive Patient Education  2017 [employer]

## 2022-09-30 NOTE — Progress Notes (Signed)
I connected with  Joanna Hood on 09/30/22 by a audio enabled telemedicine application and verified that I am speaking with the correct person using two identifiers.  Patient Location: Home  Provider Location: Office/Clinic  I discussed the limitations of evaluation and management by telemedicine. The patient expressed understanding and agreed to proceed.  Subjective:   Joanna Hood is a 77 y.o. female who presents for Medicare Annual (Subsequent) preventive examination.  Review of Systems      Cardiac Risk Factors include: advanced age (>85men, >39 women);hypertension     Objective:    Today's Vitals   09/30/22 1002  Weight: 131 lb (59.4 kg)  Height: 5\' 2"  (1.575 m)   Body mass index is 23.96 kg/m.     09/30/2022   10:09 AM 09/05/2021    2:06 PM 10/24/2020    8:14 AM 10/21/2020    7:22 AM 09/04/2020    2:50 PM 06/02/2019   12:01 PM 05/26/2018   10:15 AM  Advanced Directives  Does Patient Have a Medical Advance Directive? No No No No No No No  Does patient want to make changes to medical advance directive?  Yes (MAU/Ambulatory/Procedural Areas - Information given)   No - Patient declined    Would patient like information on creating a medical advance directive? No - Patient declined  No - Patient declined   No - Patient declined No - Patient declined    Current Medications (verified) Outpatient Encounter Medications as of 09/30/2022  Medication Sig   acetaminophen (TYLENOL) 325 MG tablet Take 650 mg by mouth every 6 (six) hours as needed (pain).   amLODipine (NORVASC) 10 MG tablet TAKE 1 TABLET EVERY DAY   amoxicillin (AMOXIL) 500 MG tablet Take 4 tablets (2,000 mg total) by mouth as needed (1 hour prior to dental work).   aspirin EC 81 MG tablet Take 81 mg by mouth daily. Swallow whole.   atorvastatin (LIPITOR) 20 MG tablet TAKE 1 TABLET(20 MG) BY MOUTH DAILY   Calcium Carb-Cholecalciferol (CALCIUM-VITAMIN D) 600-400 MG-UNIT TABS Take 1 tablet by mouth daily.    cholecalciferol (VITAMIN D) 1000 UNITS tablet Take 1,000 Units by mouth daily.   folic acid (FOLVITE) 1 MG tablet Take 1 tablet (1 mg total) by mouth daily.   furosemide (LASIX) 20 MG tablet TAKE 1 TABLET EVERY DAY   levothyroxine (SYNTHROID) 100 MCG tablet TAKE 1 TABLET (100 MCG TOTAL) BY MOUTH DAILY BEFORE BREAKFAST. (NEW DOSE)   methotrexate (RHEUMATREX) 2.5 MG tablet Take 15 mg by mouth every Friday. Caution:Chemotherapy. Protect from light.   omeprazole (PRILOSEC) 20 MG capsule TAKE 1 CAPSULE EVERY DAY   sacubitril-valsartan (ENTRESTO) 97-103 MG TAKE 1 TABLET TWICE DAILY   spironolactone (ALDACTONE) 25 MG tablet TAKE 1 TABLET (25 MG TOTAL) BY MOUTH DAILY.   warfarin (COUMADIN) 4 MG tablet TAKE 1 TABLET EVERY DAY OR AS DIRECTED BY THE COUMADIN CLINIC   Facility-Administered Encounter Medications as of 09/30/2022  Medication   0.9 %  sodium chloride infusion    Allergies (verified) Metronidazole   History: Past Medical History:  Diagnosis Date   Anemia    Atrial fibrillation    Atrial fibrillation with RVR 2014   persistent   Cataract    Chronic diastolic CHF (congestive heart failure)    HTN   Chronic diastolic congestive heart failure    COVID-19 virus infection 01/20/2019   ESOPHAGEAL STRICTURE 02/19/2005   Qualifier: Diagnosis of  By: Delrae Alfred MD, [employer]  Heart murmur    History of anemia    unclear cause, now resolved   History of chicken pox    History of colon polyps    benign   HLD (hyperlipidemia)    Hypothyroidism    Malignant hypertension longstanding   Microcytosis    Mitral valve regurgitation    Osteopenia 12/2013   T -2.3 hip   Osteoporosis    Pulmonary hypertension    Rheumatoid arthritis 11/2015   +RF, +CCP, ESR 49, synovitis on exam and [place] 11/2015 [facility])   Right lower lobe pneumonia 07/24/2014   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   29 mm Medtronic Mosaic stented porcine bioprosthetic tissue valve    Tricuspid regurgitation    Vitamin D deficiency    Past Surgical History:  Procedure Laterality Date   CARDIAC CATHETERIZATION  2014   normal per patient   CLIPPING OF ATRIAL APPENDAGE N/A 10/28/2020   Procedure: CLIPPING OF ATRIAL APPENDAGE USING ATRICURE  CLIP SIZE ;  Surgeon: Purcell Nails, MD;  Location: [facility] OR;  Service: Open Heart Surgery;  Laterality: N/A;   COLONOSCOPY  11/2015   TA, int hem, rpt 5 yrs Marina Goodell)   COLONOSCOPY  05/2021   WNL no repeat recommended Marina Goodell)   LEFT HEART CATHETERIZATION WITH CORONARY ANGIOGRAM N/A 05/01/2013   Procedure: LEFT HEART CATHETERIZATION WITH CORONARY ANGIOGRAM;  Surgeon: Marykay Lex, MD;  Location: [facility] CATH LAB;  Service: Cardiovascular;  Laterality: N/A;   MITRAL VALVE REPAIR N/A 10/28/2020   Procedure: MITRAL VALVE REPLACEMENT (MVR) USING MEDTRONIC MOSAIC VALVE SIZE ;  Surgeon: Purcell Nails, MD;  Location: [facility] OR;  Service: Open Heart Surgery;  Laterality: N/A;   MULTIPLE EXTRACTIONS WITH ALVEOLOPLASTY N/A 10/24/2020   Procedure: MULTIPLE EXTRACTION WITH ALVEOLOPLASTY;  Surgeon: Sharman Cheek, DMD;  Location: [facility] OR;  Service: Dentistry;  Laterality: N/A;   MULTIPLE TOOTH EXTRACTIONS  10/2020   Extractions of teeth numbers 3, 4, 7, 8, 9, 10, 11 and 14 - prior to valvular surgery   PARTIAL HYSTERECTOMY  1970   fibroids   RIGHT HEART CATH N/A 10/25/2020   Procedure: RIGHT HEART CATH;  Surgeon: Dolores Patty, MD;  Location: [facility] INVASIVE CV LAB;  Service: Cardiovascular;  Laterality: N/A;   RIGHT/LEFT HEART CATH AND CORONARY ANGIOGRAPHY N/A 10/21/2020   Procedure: RIGHT/LEFT HEART CATH AND CORONARY ANGIOGRAPHY;  Surgeon: Runell Gess, MD;  Location: [facility] INVASIVE CV LAB;  Service: Cardiovascular;  Laterality: N/A;   TEE WITHOUT CARDIOVERSION N/A 10/21/2020   Procedure: TRANSESOPHAGEAL ECHOCARDIOGRAM (TEE);  Surgeon: Lewayne Bunting, MD;  Location: [facility] ENDOSCOPY;  Service: Cardiovascular;  Laterality: N/A;   TEE  WITHOUT CARDIOVERSION N/A 10/28/2020   Procedure: TRANSESOPHAGEAL ECHOCARDIOGRAM (TEE);  Surgeon: Purcell Nails, MD;  Location: [facility] OR;  Service: Open Heart Surgery;  Laterality: N/A;   Family History  Problem Relation Age of Onset   Cancer Mother        ovarian   Sudden death Mother    Diabetes Sister    Hypertension Sister    Cancer Maternal Grandmother        lung   Heart disease Maternal Grandmother    Hypertension Cousin    CAD Neg Hx    Stroke Neg Hx    Colon cancer Neg Hx    Esophageal cancer Neg Hx    Rectal cancer Neg Hx    Colon polyps Neg Hx    Stomach cancer Neg Hx    Social  History   Socioeconomic History   Marital status: Single    Spouse name: Not on file   Number of children: 1   Years of education: Not on file   Highest education level: Not on file  Occupational History   Occupation: Retired  Tobacco Use   Smoking status: Former    Types: Cigarettes    Quit date: 05/16/1969    Years since quitting: 53.4   Smokeless tobacco: Former  Building services engineer Use: Never used  Substance and Sexual Activity   Alcohol use: No    Alcohol/week: 0.0 standard drinks of alcohol   Drug use: No   Sexual activity: Not Currently  Other Topics Concern   Not on file  Social History Narrative   Lives with grand-daughter and godson   Occupation: retired, 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   Edu: HS   Social Determinants of Health   Financial Resource Strain: Low Risk  (07/07/2022)   Overall Financial Resource Strain (CARDIA)    Difficulty of Paying Living Expenses: Not hard at all  Food Insecurity: No Food Insecurity (09/30/2022)   Hunger Vital Sign    Worried About Running Out of Food in the Last Year: Never true    Ran Out of Food in the Last Year: Never true  Transportation Needs: No Transportation Needs (07/07/2022)   PRAPARE - Administrator, Civil Service (Medical): No    Lack of Transportation (Non-Medical): No   Physical Activity: Sufficiently Active (09/30/2022)   Exercise Vital Sign    Days of Exercise per Week: 3 days    Minutes of Exercise per Session: 50 min  Stress: No Stress Concern Present (09/05/2021)   [employer] of Occupational Health - Occupational Stress Questionnaire    Feeling of Stress : Not at all  Social Connections: Moderately Isolated (09/30/2022)   Social Connection and Isolation Panel [NHANES]    Frequency of Communication with Friends and Family: More than three times a week    Frequency of Social Gatherings with Friends and Family: More than three times a week    Attends Religious Services: More than 4 times per year    Active Member of [employer] or Organizations: No    Attends Engineer, structural: Never    Marital Status: Never married    Tobacco Counseling Counseling given: Not Answered   Clinical Intake:  Pre-visit preparation completed: Yes  Pain : No/denies pain     Nutritional Risks: None Diabetes: No  How often do you need to have someone help you when you read instructions, pamphlets, or other written materials from your doctor or pharmacy?: 1 - Never  Diabetic? no  Interpreter Needed?: No  Information entered by :: C.Deserae Jennings LPN   Activities of Daily Living    09/30/2022   10:10 AM  In your present state of health, do you have any difficulty performing the following activities:  Hearing? 0  Vision? 0  Difficulty concentrating or making decisions? 0  Walking or climbing stairs? 0  Dressing or bathing? 0  Doing errands, shopping? 0  Preparing Food and eating ? N  Using the Toilet? N  In the past six months, have you accidently leaked urine? N  Do you have problems with loss of bowel control? N  Managing your Medications? N  Managing your Finances? N  Housekeeping or managing your Housekeeping? N    Patient Care Team: Eustaquio Boyden, MD as PCP - General (Family  Medicine) Runell GessBerry, Jonathan J, MD as Consulting Physician  (Cardiology) Zenovia JordanHawkes, Angela, MD as Consulting Physician (Rheumatology) Genelle GatherFox, William E, OD as Consulting Physician (Optometry) Kathyrn SheriffFoltanski, Lindsey N, [facility] Center[facility] as Pharmacist (Pharmacist)  Indicate any recent Medical Services you may have received from other than [facility] providers in the past year (date may be approximate).     Assessment:   This is a routine wellness examination for Joanna Hood.  Hearing/Vision screen Hearing Screening - Comments:: No aids [facility] - [facility]:: [facility]  Dietary issues and exercise activities discussed: Current Exercise Habits: Home exercise routine, Type of exercise: walking, Time (Minutes): 45, Frequency (Times/Week): 2, Weekly Exercise (Minutes/Week): 90, Intensity: Mild, Exercise limited by: None identified   Goals Addressed             This Visit's Progress    Patient Stated       Exercise more and stay mobile.       Depression Screen    09/30/2022   10:09 AM 09/05/2021    2:08 PM 03/28/2021    2:29 PM 01/28/2021   12:36 PM 09/04/2020    2:52 PM 06/28/2020    9:40 AM 06/02/2019   12:02 PM  PHQ 2/9 Scores  PHQ - 2 Score 0 0 0 0 0 0 0  PHQ- 9 Score     0  0    Fall Risk    09/30/2022   10:10 AM 09/05/2021    2:07 PM 01/28/2021    9:56 AM 09/04/2020    2:51 PM 06/28/2020    9:40 AM  Fall Risk   Falls in the past year? 0 0 0 0 0  Number falls in past yr: 0 0  0 0  Injury with Fall? 0 0  0 0  Risk for fall due to : No Fall Risks No Fall Risks Other (Comment) Medication side effect   Risk for fall due to: Comment   Single leg stand less than 5 seconds.    Follow up Falls prevention discussed;Falls evaluation completed Falls prevention discussed Falls evaluation completed Falls evaluation completed;Falls prevention discussed Falls evaluation completed    FALL RISK PREVENTION PERTAINING TO THE HOME:  Any stairs in or around the home? No  If so, are there any without handrails? No  Home free of loose throw rugs in walkways, pet  beds, electrical cords, etc? Yes  Adequate lighting in your home to reduce risk of falls? Yes   ASSISTIVE DEVICES UTILIZED TO PREVENT FALLS:  Life alert? No  Use of a cane, walker or w/c? No  Grab bars in the bathroom? No  Shower chair or bench in shower? No  Elevated toilet seat or a handicapped toilet? No    Cognitive Function:    09/04/2020    2:54 PM 06/02/2019   12:04 PM 05/26/2018   10:16 AM 04/23/2016    9:26 AM  MMSE - Mini Mental State Exam  Orientation to time 5 5 5 5   Orientation to Place 5 5 5 5   Registration 3 3 3 3   Attention/ Calculation 5 5 0 0  Recall 3 3 3 3   Language- name 2 objects   0 0  Language- repeat 1 1 1 1   Language- follow 3 step command   3 3  Language- read & follow direction   0 0  Write a sentence   0 0  Copy design   0 0  Total score   20 20  09/30/2022   10:11 AM  6CIT Screen  What Year? 0 points  What month? 0 points  What time? 0 points  Count back from 20 0 points  Months in reverse 0 points  Repeat phrase 0 points  Total Score 0 points    Immunizations Immunization History  Administered Date(s) Administered   Fluad Quad(high Dose 65+) 03/24/2021, 03/27/2022   Influenza Whole 07/05/2009, 05/14/2010   Influenza, High Dose Seasonal PF 04/03/2014, 03/17/2017, 04/05/2018, 04/04/2019, 03/26/2020   Influenza,inj,Quad PF,6+ Mos 04/30/2013, 04/23/2016   Influenza-Unspecified 04/12/2015   PFIZER(Purple Top)SARS-COV-2 Vaccination 08/18/2019, 09/12/2019, 04/01/2020   Pfizer Covid-19 Vaccine Bivalent Booster 30yrs & up 10/23/2021   Pneumococcal Conjugate-13 04/23/2015   Pneumococcal Polysaccharide-23 04/30/2013   Td 06/23/2003   Tdap 04/23/2016, 11/01/2017   Zoster Recombinat (Shingrix) 10/23/2021, 01/09/2022    TDAP status: Up to date  Flu Vaccine status: Up to date  Pneumococcal vaccine status: Up to date  Covid-19 vaccine status: Information provided on how to obtain vaccines.   Qualifies for Shingles Vaccine? Yes    Zostavax completed No   Shingrix Completed?: Yes [facility] [place]  Screening Tests Health Maintenance  Topic Date Due   COVID-19 Vaccine (5 - 2023-24 season) 02/20/2022   INFLUENZA VACCINE  01/21/2023   MAMMOGRAM  08/04/2023   Medicare Annual Wellness (AWV)  09/30/2023   DTaP/Tdap/Td (4 - Td or Tdap) 11/02/2027   Pneumonia Vaccine 69+ Years old  Completed   DEXA SCAN  Completed   Hepatitis C Screening  Completed   Zoster Vaccines- Shingrix  Completed   HPV VACCINES  Aged Out   COLONOSCOPY (Pts 45-46yrs Insurance coverage will need to be confirmed)  Discontinued    Health Maintenance  Health Maintenance Due  Topic Date Due   COVID-19 Vaccine (5 - 2023-24 season) 02/20/2022    Colorectal cancer screening: No longer required.   Mammogram status: Completed 08/03/22. Repeat every year  Bone Density status: Completed 02/24/2022. Results reflect: Bone density results: OSTEOPENIA. Repeat every 2 years.  Lung Cancer Screening: (Low Dose CT Chest recommended if Age 16-80 years, 30 pack-year currently smoking OR have quit w/in 15years.) does not qualify.   Lung Cancer Screening Referral: no  Additional Screening:  Hepatitis C Screening: does qualify; Completed 04/23/16  Vision Screening: Recommended annual ophthalmology exams for early detection of glaucoma and other disorders of the eye. Is the patient up to date with their annual eye exam?  Yes  Who is the provider or what is the name of the office in which the patient attends annual eye exams? [facility] If pt is not established with a provider, would they like to be referred to a provider to establish care? No .   Dental Screening: Recommended annual dental exams for proper oral hygiene  Community Resource Referral / Chronic Care Management: CRR required this visit?  No   CCM required this visit?  No      Plan:     I have personally reviewed and noted the following in the patient's chart:   Medical and  social history Use of alcohol, tobacco or illicit drugs  Current medications and supplements including opioid prescriptions. Patient is not currently taking opioid prescriptions. Functional ability and status Nutritional status Physical activity Advanced directives List of other physicians Hospitalizations, surgeries, and ER visits in previous 12 months Vitals Screenings to include cognitive, depression, and falls Referrals and appointments  In addition, I have reviewed and discussed with patient certain preventive protocols, quality metrics, and best practice  recommendations. A written personalized care plan for preventive services as well as general preventive health recommendations were provided to patient.     Maryan Puls, LPN   1/61/0960   Nurse Notes: none

## 2022-10-07 ENCOUNTER — Encounter: Payer: Self-pay | Admitting: Family Medicine

## 2022-10-07 ENCOUNTER — Ambulatory Visit (INDEPENDENT_AMBULATORY_CARE_PROVIDER_SITE_OTHER): Payer: Medicare HMO | Admitting: Family Medicine

## 2022-10-07 VITALS — BP 115/60 | HR 62 | Temp 97.3°F | Ht 62.0 in | Wt 135.4 lb

## 2022-10-07 DIAGNOSIS — E559 Vitamin D deficiency, unspecified: Secondary | ICD-10-CM

## 2022-10-07 DIAGNOSIS — N289 Disorder of kidney and ureter, unspecified: Secondary | ICD-10-CM

## 2022-10-07 DIAGNOSIS — Z7189 Other specified counseling: Secondary | ICD-10-CM

## 2022-10-07 DIAGNOSIS — I5032 Chronic diastolic (congestive) heart failure: Secondary | ICD-10-CM | POA: Diagnosis not present

## 2022-10-07 DIAGNOSIS — M069 Rheumatoid arthritis, unspecified: Secondary | ICD-10-CM

## 2022-10-07 DIAGNOSIS — M8589 Other specified disorders of bone density and structure, multiple sites: Secondary | ICD-10-CM | POA: Diagnosis not present

## 2022-10-07 DIAGNOSIS — E039 Hypothyroidism, unspecified: Secondary | ICD-10-CM | POA: Diagnosis not present

## 2022-10-07 DIAGNOSIS — Z953 Presence of xenogenic heart valve: Secondary | ICD-10-CM

## 2022-10-07 DIAGNOSIS — I10 Essential (primary) hypertension: Secondary | ICD-10-CM

## 2022-10-07 DIAGNOSIS — Z0000 Encounter for general adult medical examination without abnormal findings: Secondary | ICD-10-CM

## 2022-10-07 DIAGNOSIS — D638 Anemia in other chronic diseases classified elsewhere: Secondary | ICD-10-CM

## 2022-10-07 DIAGNOSIS — I4819 Other persistent atrial fibrillation: Secondary | ICD-10-CM

## 2022-10-07 DIAGNOSIS — Z7901 Long term (current) use of anticoagulants: Secondary | ICD-10-CM

## 2022-10-07 DIAGNOSIS — E042 Nontoxic multinodular goiter: Secondary | ICD-10-CM

## 2022-10-07 NOTE — Assessment & Plan Note (Signed)
Advanced directives: HCPOA would be daughter - has spoken with her. Packet provided today. Encouraged she work on this.  ?

## 2022-10-07 NOTE — Assessment & Plan Note (Signed)
Continue vit D 1000 IU daily + cal/vit D.

## 2022-10-07 NOTE — Progress Notes (Signed)
Ph: [phone]       Fax: [phone]   Patient ID: Joanna Hood, female    DOB: May 27, 1946, 77 y.o.   MRN: [id number]  This visit was conducted in person.  BP 115/60 Comment: home BP this morning  Pulse 62   Temp (!) 97.3 F (36.3 C) (Temporal)   Ht  (1.575 m)   Wt 135 lb 6 oz (61.4 kg)   SpO2 100%   BMI 24.76 kg/m   186/68 on retesting however home BP this morning: 115/60s BP Readings from Last 3 Encounters:  10/07/22 115/60  04/20/22 (!) 148/66  03/27/22 128/60   CC: CPE Subjective:   HPI: Joanna Hood is a 77 y.o. female presenting on 10/07/2022 for Annual Exam (MCR prt 2 [AWV- 09/30/22].)   Saw health advisor last week for medicare wellness visit. Note reviewed.   No results found.  Flowsheet Row Clinical Support from 09/30/2022 in [facility] [place]  PHQ-2 Total Score 0          09/30/2022   10:10 AM 09/05/2021    2:07 PM 01/28/2021    9:56 AM 09/04/2020    2:51 PM 06/28/2020    9:40 AM  Fall Risk   Falls in the past year? 0 0 0 0 0  Number falls in past yr: 0 0  0 0  Injury with Fall? 0 0  0 0  Risk for fall due to : No Fall Risks No Fall Risks Other (Comment) Medication side effect   Risk for fall due to: Comment   Single leg stand less than 5 seconds.    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  Needs SBE ppx.   HTN - continues amlodipine  daily, lasix  daily, entresto 90/103 BID, spironolactone  daily. Home BP this morning: 115/60s. Overall accurate readings when compared at cardiology office.   Persistent microcytic anemia unresponsive to oral iron replacement - saw hematology, thought  anemia of chronic disease. Oral iron replacement stopped.    RA - on MTX  weekly sees rheum Q6 mo Nickola Major)   Preventative:   COLONOSCOPY 11/2015 - TA, int hem, rpt 5 yrs Marina Goodell) - will be due this summer.  COLONOSCOPY 05/2021 - WNL no repeat recommended Marina Goodell)  Well woman exam - partial hysterectomy 1970, ovaries remain. All normal pap smears. No pelvic pain/pressure  [facility]  DEXA 12/2013 - T-2.3 osteopenia  DEXA 08/2019 - T -2.0 R hip, 0.7 spine  DEXA 02/2022 T -2.4 L forearm, -2.3 RFN, not at increased risk of fracture - reviewed calcium/vitamin D daily and regular walking/weight bearing exercise.  Lung cancer screen - not eligible  Flu shot yearly  COVID vaccine Pfizer 07/2019, 08/2019, booster 03/2020  Pneumovax 04/2013, prevnar-13 2016 Tdap 2017, 2019 Shingrix - 10/2021, 12/2021 RSV - discussed  Advanced directives: HCPOA would be daughter - has spoken with her. Packet previously provided, again today. Encouraged she work on this.  Seat belt use discussed Sunscreen use discussed. No changing moles on skin.  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 Interim medical history since our last visit reviewed. Allergies and medications reviewed and updated. Outpatient Medications Prior to Visit  Medication Sig Dispense Refill   acetaminophen (TYLENOL) 325 MG tablet Take 650 mg by mouth every 6 (six) hours as needed (pain).     amLODipine (NORVASC) 10 MG tablet TAKE 1 TABLET EVERY DAY 90 tablet 3   amoxicillin (AMOXIL) 500 MG  tablet Take 4 tablets (2,000 mg total) by mouth as needed (1 hour prior to dental work). 4 tablet 3   aspirin EC 81 MG tablet Take 81 mg by mouth daily. Swallow whole.     atorvastatin (LIPITOR) 20 MG tablet TAKE 1 TABLET(20 MG) BY MOUTH DAILY 90 tablet 3   Calcium Carb-Cholecalciferol (CALCIUM-VITAMIN D) 600-400 MG-UNIT TABS Take 1 tablet by mouth daily.     cholecalciferol (VITAMIN D) 1000 UNITS tablet Take 1,000 Units by mouth daily.     folic acid (FOLVITE) 1 MG tablet Take 1 tablet (1 mg total) by mouth daily. 90 tablet 3   furosemide (LASIX) 20 MG tablet TAKE 1 TABLET EVERY DAY 90 tablet 3   levothyroxine (SYNTHROID) 100 MCG tablet TAKE 1 TABLET (100 MCG TOTAL) BY MOUTH DAILY BEFORE BREAKFAST. (NEW DOSE) 90 tablet 0   methotrexate (RHEUMATREX) 2.5 MG tablet Take 15 mg by mouth every Friday. Caution:Chemotherapy. Protect from light.     omeprazole (PRILOSEC) 20 MG capsule TAKE 1 CAPSULE EVERY DAY 90 capsule 0   sacubitril-valsartan (ENTRESTO) 97-103 MG TAKE 1 TABLET TWICE DAILY 180 tablet 0   spironolactone (ALDACTONE) 25 MG tablet TAKE 1 TABLET (25 MG TOTAL) BY MOUTH DAILY. 90 tablet 0   warfarin (COUMADIN) 4 MG tablet TAKE 1 TABLET EVERY DAY OR AS DIRECTED BY THE COUMADIN CLINIC 100 tablet 1   Facility-Administered Medications Prior to Visit  Medication Dose Route Frequency Provider Last Rate Last Admin   0.9 %  sodium chloride infusion  500 mL Intravenous Once Hilarie Fredrickson, MD         Per HPI unless specifically indicated in ROS section below Review of Systems  Constitutional:  Negative for activity change, appetite change, chills, fatigue, fever and unexpected weight change.  HENT:  Negative for hearing loss.   Eyes:  Negative for visual disturbance.  Respiratory:  Negative for cough, chest tightness, shortness of breath and wheezing.   Cardiovascular:  Negative for chest pain, palpitations and leg swelling.  Gastrointestinal:  Negative for abdominal distention, abdominal pain,  blood in stool, constipation, diarrhea, nausea and vomiting.  Genitourinary:  Negative for difficulty urinating and hematuria.  Musculoskeletal:  Negative for arthralgias, myalgias and neck pain.  Skin:  Negative for rash.  Neurological:  Negative for dizziness, seizures, syncope and headaches.  Hematological:  Negative for adenopathy. Does not bruise/bleed easily.  Psychiatric/Behavioral:  Negative for dysphoric mood. The patient is not nervous/anxious.     Objective:  BP 115/60 Comment: home BP this morning  Pulse 62   Temp (!) 97.3 F (36.3 C) (Temporal)   Ht 5\' 2"  (1.575 m)   Wt 135 lb 6 oz (61.4 kg)   SpO2 100%   BMI 24.76 kg/m   Wt Readings from Last 3 Encounters:  10/07/22 135 lb 6 oz (  61.4 kg)  09/30/22 131 lb (59.4 kg)  04/20/22 132 lb 12.8 oz (60.2 kg)      Physical Exam Vitals and nursing note reviewed.  Constitutional:      Appearance: Normal appearance. She is not ill-appearing.  HENT:     Head: Normocephalic and atraumatic.     Right Ear: Tympanic membrane, ear canal and external ear normal. There is no impacted cerumen.     Left Ear: Tympanic membrane, ear canal and external ear normal. There is no impacted cerumen.     Nose: Nose normal.     Mouth/Throat:     Mouth: Mucous membranes are moist.     Pharynx: Oropharynx is clear. No oropharyngeal exudate or posterior oropharyngeal erythema.  Eyes:     General:        Right eye: No discharge.        Left eye: No discharge.     Extraocular Movements: Extraocular movements intact.     Conjunctiva/sclera: Conjunctivae normal.     Pupils: Pupils are equal, round, and reactive to light.  Neck:     Thyroid: No thyroid mass or thyromegaly.     Vascular: Carotid bruit (anticipate referred from known murmur) present.  Cardiovascular:     Rate and Rhythm: Normal rate. Rhythm irregularly irregular.     Pulses: Normal pulses.     Heart sounds: Murmur (3/6 systolic throughout) heard.  Pulmonary:     Effort:  Pulmonary effort is normal. No respiratory distress.     Breath sounds: Normal breath sounds. No wheezing, rhonchi or rales.  Abdominal:     General: Bowel sounds are normal. There is no distension.     Palpations: Abdomen is soft. There is no mass.     Tenderness: There is no abdominal tenderness. There is no guarding or rebound.     Hernia: No hernia is present.  Musculoskeletal:     Cervical back: Normal range of motion and neck supple. No rigidity.     Right lower leg: No edema.     Left lower leg: No edema.  Lymphadenopathy:     Cervical: No cervical adenopathy.  Skin:    General: Skin is warm and dry.     Findings: No rash.  Neurological:     General: No focal deficit present.     Mental Status: She is alert. Mental status is at baseline.  Psychiatric:        Mood and Affect: Mood normal.        Behavior: Behavior normal.       Results for orders placed or performed in visit on 09/30/22  Microalbumin / creatinine urine ratio  Result Value Ref Range   Microalb, Ur 1.3 0.0 - 1.9 mg/dL   Creatinine,U 79.3 mg/dL   Microalb Creat Ratio 7.9 0.0 - 30.0 mg/g  VITAMIN D 25 Hydroxy (Vit-D Deficiency, Fractures)  Result Value Ref Range   VITD 41.49 30.00 - 100.00 ng/mL  TSH  Result Value Ref Range   TSH 0.45 0.35 - 5.50 uIU/mL  CBC with Differential/Platelet  Result Value Ref Range   WBC 5.6 4.0 - 10.5 K/uL   RBC 4.32 3.87 - 5.11 Mil/uL   Hemoglobin 11.0 (L) 12.0 - 15.0 g/dL   HCT 90.3 (L) 00.9 - 23.3 %   MCV 80.4 78.0 - 100.0 fl   MCHC 31.7 30.0 - 36.0 g/dL   RDW 00.7 (H) 62.2 - 63.3 %   Platelets 191.0 150.0 - 400.0 K/uL   Neutrophils  Relative % 70.3 43.0 - 77.0 %   Lymphocytes Relative 19.5 12.0 - 46.0 %   Monocytes Relative 8.0 3.0 - 12.0 %   Eosinophils Relative 1.5 0.0 - 5.0 %   Basophils Relative 0.7 0.0 - 3.0 %   Neutro Abs 4.0 1.4 - 7.7 K/uL   Lymphs Abs 1.1 0.7 - 4.0 K/uL   Monocytes Absolute 0.5 0.1 - 1.0 K/uL   Eosinophils Absolute 0.1 0.0 - 0.7 K/uL    Basophils Absolute 0.0 0.0 - 0.1 K/uL  Comprehensive metabolic panel  Result Value Ref Range   Sodium 140 135 - 145 mEq/L   Potassium 3.8 3.5 - 5.1 mEq/L   Chloride 104 96 - 112 mEq/L   CO2 25 19 - 32 mEq/L   Glucose, Bld 101 (H) 70 - 99 mg/dL   BUN 26 (H) 6 - 23 mg/dL   Creatinine, Ser 0.63 0.40 - 1.20 mg/dL   Total Bilirubin 0.6 0.2 - 1.2 mg/dL   Alkaline Phosphatase 115 39 - 117 U/L   AST 18 0 - 37 U/L   ALT 12 0 - 35 U/L   Total Protein 7.7 6.0 - 8.3 g/dL   Albumin 4.4 3.5 - 5.2 g/dL   GFR 01.60 (L) >10.93 mL/min   Calcium 9.6 8.4 - 10.5 mg/dL  Lipid panel  Result Value Ref Range   Cholesterol 168 0 - 200 mg/dL   Triglycerides 23.5 0.0 - 149.0 mg/dL   HDL 57.32 >20.25 mg/dL   VLDL 42.7 0.0 - 06.2 mg/dL   LDL Cholesterol 376 (H) 0 - 99 mg/dL   Total CHOL/HDL Ratio 3    NonHDL 111.18     Assessment & Plan:   Problem List Items Addressed This Visit     Health maintenance examination - Primary (Chronic)    Preventative protocols reviewed and updated unless pt declined. Discussed healthy diet and lifestyle.       Advanced care planning/counseling discussion (Chronic)    Advanced directives: HCPOA would be daughter - has spoken with her. Packet provided today. Encouraged she work on this.       GOITER, MULTINODULAR    TSH normal.       Hypothyroidism    Chronic, stable on levothyroxine daily      Osteopenia    Reviewed calcium , vit D intake and regular weight bearing exercise      Persistent atrial fibrillation    Sounds irregular but rate controlled today      HTN (hypertension), malignant    Chronic, BP elevated in office today however home readings largely well controlled - continue current regimen. Anticipate component of white coat hypertension.       Anemia of chronic disease    Chronic, stable period off oral iron.       Vitamin D deficiency    Continue vit D 1000 IU daily + cal/vit D.       Rheumatoid arthritis    Sees rheumatology  regularly Nickola Major)      Renal insufficiency    Reviewed latest GFR 48. Encouraged good water intake, good BP control, avoiding nephrotoxic agents       Chronic heart failure with preserved ejection fraction (HFpEF)    Chronic, stable period on current medication including lasix and entresto .      S/P mitral valve replacement with bioprosthetic valve    S/p porcine Medtronic Mosaic stented valve (29 mm) with clipping of left atrial appendage (2022).  Needs SBE ppx.  Long term (current) use of anticoagulants     No orders of the defined types were placed in this encounter.   No orders of the defined types were placed in this encounter.   Patient Instructions  Consider RSV vaccine through pharmacy.  Work on Scientist, water quality, packet provided today You are doing well today Return as needed or in 6 months for follow up hypertension visit.   Follow up plan: Return in about 6 months (around 04/08/2023) for follow up visit.  Eustaquio Boyden, MD

## 2022-10-07 NOTE — Assessment & Plan Note (Signed)
TSH normal

## 2022-10-07 NOTE — Assessment & Plan Note (Addendum)
Chronic, BP elevated in office today however home readings largely well controlled - continue current regimen. Anticipate component of white coat hypertension.

## 2022-10-07 NOTE — Assessment & Plan Note (Signed)
Chronic, stable on levothyroxine 100mcg daily.  ?

## 2022-10-07 NOTE — Assessment & Plan Note (Signed)
Preventative protocols reviewed and updated unless pt declined. Discussed healthy diet and lifestyle.  

## 2022-10-07 NOTE — Assessment & Plan Note (Signed)
Reviewed latest GFR 48. Encouraged good water intake, good BP control, avoiding nephrotoxic agents

## 2022-10-07 NOTE — Assessment & Plan Note (Signed)
Chronic, stable period off oral iron.

## 2022-10-07 NOTE — Assessment & Plan Note (Signed)
S/p porcine Medtronic Mosaic stented valve (29 mm) with clipping of left atrial appendage (2022).  Needs SBE ppx.

## 2022-10-07 NOTE — Assessment & Plan Note (Addendum)
Reviewed calcium, vit D intake and regular weight bearing exercise.  

## 2022-10-07 NOTE — Assessment & Plan Note (Signed)
Sounds irregular but rate controlled today

## 2022-10-07 NOTE — Patient Instructions (Addendum)
Consider RSV vaccine through pharmacy.  Work on Scientist, water quality, packet provided today You are doing well today Return as needed or in 6 months for follow up hypertension visit.

## 2022-10-07 NOTE — Assessment & Plan Note (Signed)
Chronic, stable period on current medication including lasix and entresto .

## 2022-10-07 NOTE — Assessment & Plan Note (Signed)
Sees rheumatology regularly Nickola Major)

## 2022-10-13 DIAGNOSIS — Z1589 Genetic susceptibility to other disease: Secondary | ICD-10-CM | POA: Diagnosis not present

## 2022-10-13 DIAGNOSIS — M0579 Rheumatoid arthritis with rheumatoid factor of multiple sites without organ or systems involvement: Secondary | ICD-10-CM | POA: Diagnosis not present

## 2022-10-13 DIAGNOSIS — Z79899 Other long term (current) drug therapy: Secondary | ICD-10-CM | POA: Diagnosis not present

## 2022-10-13 DIAGNOSIS — Z6823 Body mass index (BMI) 23.0-23.9, adult: Secondary | ICD-10-CM | POA: Diagnosis not present

## 2022-10-13 DIAGNOSIS — M1991 Primary osteoarthritis, unspecified site: Secondary | ICD-10-CM | POA: Diagnosis not present

## 2022-10-20 ENCOUNTER — Ambulatory Visit: Payer: Medicare HMO | Attending: Cardiovascular Disease | Admitting: *Deleted

## 2022-10-20 DIAGNOSIS — I4819 Other persistent atrial fibrillation: Secondary | ICD-10-CM

## 2022-10-20 DIAGNOSIS — Z7901 Long term (current) use of anticoagulants: Secondary | ICD-10-CM

## 2022-10-20 DIAGNOSIS — Z953 Presence of xenogenic heart valve: Secondary | ICD-10-CM

## 2022-10-20 LAB — POCT INR: INR: 3.1 — AB (ref 2.0–3.0)

## 2022-10-20 NOTE — Patient Instructions (Signed)
Description   Today take 1/2 tablet of warfarin then continue taking warfarin 1 tablet daily. Recheck 5 weeks. [facility] [phone]

## 2022-10-24 ENCOUNTER — Other Ambulatory Visit (HOSPITAL_COMMUNITY): Payer: Self-pay | Admitting: Internal Medicine

## 2022-11-24 ENCOUNTER — Ambulatory Visit: Payer: Medicare HMO | Attending: Cardiovascular Disease | Admitting: *Deleted

## 2022-11-24 DIAGNOSIS — Z953 Presence of xenogenic heart valve: Secondary | ICD-10-CM

## 2022-11-24 DIAGNOSIS — Z7901 Long term (current) use of anticoagulants: Secondary | ICD-10-CM | POA: Diagnosis not present

## 2022-11-24 DIAGNOSIS — I4819 Other persistent atrial fibrillation: Secondary | ICD-10-CM | POA: Diagnosis not present

## 2022-11-24 LAB — POCT INR: INR: 3.7 — AB (ref 2.0–3.0)

## 2022-11-24 NOTE — Patient Instructions (Addendum)
Description   Do not take any warfarin today then START taking warfarin 1 tablet daily except 1/2 tablet on Sundays. Recheck 4 weeks. Coumadin Clinic [phone] Fax Clearance Forms to [phone] or [phone]

## 2022-11-29 ENCOUNTER — Other Ambulatory Visit (HOSPITAL_COMMUNITY): Payer: Self-pay | Admitting: Adult Health

## 2022-11-29 ENCOUNTER — Other Ambulatory Visit: Payer: Self-pay | Admitting: Family Medicine

## 2022-11-29 DIAGNOSIS — I10 Essential (primary) hypertension: Secondary | ICD-10-CM

## 2022-11-29 DIAGNOSIS — E039 Hypothyroidism, unspecified: Secondary | ICD-10-CM

## 2022-12-08 ENCOUNTER — Other Ambulatory Visit: Payer: Self-pay | Admitting: Internal Medicine

## 2022-12-11 ENCOUNTER — Telehealth: Payer: Self-pay | Admitting: *Deleted

## 2022-12-11 NOTE — Telephone Encounter (Signed)
   Pre-operative Risk Assessment    Patient Name: Joanna Hood  DOB: 1945/11/10 MRN: [id number]    DENTAL OFFICE IS CLOSED TODAY 12/11/22.   Request for Surgical Clearance    Procedure:  Dental Extraction - Amount of Teeth to be Pulled:  LEFT MESSAGE TO CALL BACK WITH HOW MANY TEETH ARE BEING SURGICALLY EXTRACTED ; PT WILL ALSO BE HAVING ALVEOLOPLASTY  Date of Surgery:  Clearance TBD                                 Surgeon:  NOT LISTED Surgeon's Group or Practice Name:  [facility] Phone number:  [phone] Fax number:  [phone]   Type of Clearance Requested:   - Medical  - Pharmacy:  Hold Aspirin and Warfarin (Coumadin)     Type of Anesthesia:  Not Indicated; LEFT MESSAGE FOR TYPE OF ANESTHESIA TO BE USED    Additional requests/questions:    Elpidio Anis   12/11/2022, 12:41 PM

## 2022-12-14 NOTE — Telephone Encounter (Signed)
I s/w the DDS office and confirmed the pt will be having 12 teeth surgically extracted under local anesthesia.

## 2022-12-16 ENCOUNTER — Telehealth: Payer: Self-pay | Admitting: *Deleted

## 2022-12-16 NOTE — Telephone Encounter (Signed)
Pt is agreeable to tele pre op appt 12/22/22 @ 10:20. Med rec and consent are done.     Patient Consent for Virtual Visit        Joanna Hood has provided verbal consent on 12/16/2022 for a virtual visit (video or telephone).   CONSENT FOR VIRTUAL VISIT FOR:  Joanna Hood  By participating in this virtual visit I agree to the following:  I hereby voluntarily request, consent and authorize [facility] and its employed or contracted physicians, physician assistants, nurse practitioners or other licensed health care professionals (the Practitioner), to provide me with telemedicine health care services (the "Services") as deemed necessary by the treating Practitioner. I acknowledge and consent to receive the Services by the Practitioner via telemedicine. I understand that the telemedicine visit will involve communicating with the Practitioner through live audiovisual communication technology and the disclosure of certain medical information by electronic transmission. I acknowledge that I have been given the opportunity to request an in-person assessment or other available alternative prior to the telemedicine visit and am voluntarily participating in the telemedicine visit.  I understand that I have the right to withhold or withdraw my consent to the use of telemedicine in the course of my care at any time, without affecting my right to future care or treatment, and that the Practitioner or I may terminate the telemedicine visit at any time. I understand that I have the right to inspect all information obtained and/or recorded in the course of the telemedicine visit and may receive copies of available information for a reasonable fee.  I understand that some of the potential risks of receiving the Services via telemedicine include:  Delay or interruption in medical evaluation due to technological equipment failure or disruption; Information transmitted may not be sufficient  (e.g. poor resolution of images) to allow for appropriate medical decision making by the Practitioner; and/or  In rare instances, security protocols could fail, causing a breach of personal health information.  Furthermore, I acknowledge that it is my responsibility to provide information about my medical history, conditions and care that is complete and accurate to the best of my ability. I acknowledge that Practitioner's advice, recommendations, and/or decision may be based on factors not within their control, such as incomplete or inaccurate data provided by me or distortions of diagnostic images or specimens that may result from electronic transmissions. I understand that the practice of medicine is not an exact science and that Practitioner makes no warranties or guarantees regarding treatment outcomes. I acknowledge that a copy of this consent can be made available to me via my patient portal [facility] MyChart), or I can request a printed copy by calling the office of [facility].    I understand that my insurance will be billed for this visit.   I have read or had this consent read to me. I understand the contents of this consent, which adequately explains the benefits and risks of the Services being provided via telemedicine.  I have been provided ample opportunity to ask questions regarding this consent and the Services and have had my questions answered to my satisfaction. I give my informed consent for the services to be provided through the use of telemedicine in my medical care

## 2022-12-16 NOTE — Telephone Encounter (Signed)
Pt is agreeable to tele pre op appt 12/22/22 @ 10:20. Med rec and consent are done.

## 2022-12-16 NOTE — Telephone Encounter (Signed)
Pharmacy please advise on holding Coumadin prior to extraction of 12 teeth scheduled for TBD. Thank you.

## 2022-12-16 NOTE — Telephone Encounter (Signed)
Patient with diagnosis of afib on warfarin for anticoagulation.    Procedure: 12 dental extractions and alveoloplasty Date of procedure: TBD  CHA2DS2-VASc Score = 5  This indicates a 7.2% annual risk of stroke. The patient's score is based upon: CHF History: 1 HTN History: 1 Diabetes History: 0 Stroke History: 0 Vascular Disease History: 0 Age Score: 2 Gender Score: 1   CrCl 71mL/min Platelet count 191K  Patient does require pre-op antibiotics for dental procedure given her hx of bioprosthetic valve. She already has active rx on file for amoxicillin and should be reminded to take this beforehand.  Per office protocol, patient can hold warfarin for 5 days prior to procedure. Patient will not need bridging with Lovenox around procedure.  **This guidance is not considered finalized until pre-operative APP has relayed final recommendations.**

## 2022-12-16 NOTE — Telephone Encounter (Signed)
   Name: Joanna Hood  DOB: 29-Dec-1945  MRN: [id number]  Primary Cardiologist: None   Preoperative team, please contact this patient and set up a phone call appointment for further preoperative risk assessment. Please obtain consent and complete medication review. Thank you for your help.  I confirm that guidance regarding antiplatelet and oral anticoagulation therapy has been completed and, if necessary, noted below.  Patient does require pre-op antibiotics for dental procedure given her hx of bioprosthetic valve. She already has active rx on file for amoxicillin and should be reminded to take this beforehand.   Per office protocol, patient can hold warfarin for 5 days prior to procedure. Patient will not need bridging with Lovenox around procedure.   Napoleon Form, Leodis Rains, NP 12/16/2022, 11:26 AM [facility]

## 2022-12-18 ENCOUNTER — Ambulatory Visit (HOSPITAL_COMMUNITY): Payer: Medicare HMO

## 2022-12-22 ENCOUNTER — Ambulatory Visit: Payer: Medicare HMO | Attending: Cardiology

## 2022-12-22 ENCOUNTER — Ambulatory Visit (INDEPENDENT_AMBULATORY_CARE_PROVIDER_SITE_OTHER): Payer: Medicare HMO

## 2022-12-22 DIAGNOSIS — Z953 Presence of xenogenic heart valve: Secondary | ICD-10-CM

## 2022-12-22 DIAGNOSIS — I4819 Other persistent atrial fibrillation: Secondary | ICD-10-CM

## 2022-12-22 DIAGNOSIS — Z01810 Encounter for preprocedural cardiovascular examination: Secondary | ICD-10-CM

## 2022-12-22 DIAGNOSIS — Z7901 Long term (current) use of anticoagulants: Secondary | ICD-10-CM

## 2022-12-22 LAB — POCT INR: INR: 2 (ref 2.0–3.0)

## 2022-12-22 NOTE — Progress Notes (Signed)
Virtual Visit via Telephone Note   Because of Joanna Hood's co-morbid illnesses, she is at least at moderate risk for complications without adequate follow up.  This format is felt to be most appropriate for this patient at this time.  The patient did not have access to video technology/had technical difficulties with video requiring transitioning to audio format only (telephone).  All issues noted in this document were discussed and addressed.  No physical exam could be performed with this format.  Please refer to the patient's chart for her consent to telehealth for [facility].  Evaluation Performed:  Preoperative cardiovascular risk assessment _____________   Date:  12/22/2022   Patient ID:  Joanna Hood, DOB Feb 20, 1946, MRN [id number] Patient Location:  Home Provider location:   Office  Primary Care Provider:  Eustaquio Boyden, MD Primary Cardiologist:  None  Chief Complaint / Patient Profile   77 y.o. y/o female with a h/o HFpEF, pulmonary HTN, chronic AF (on warfarin), severe MR, HTN, rheumatoid arthritis, bioprosthetic MVR, with atrial appendage clipping who is pending dental extraction of 12 teeth and presents today for telephonic preoperative cardiovascular risk assessment.  History of Present Illness    Joanna Hood is a 77 y.o. female who presents via audio/video conferencing for a telehealth visit today.  Pt was last seen in cardiology clinic on 04/20/2022 by Dr Allyson Sabal.  At that time Joanna Hood was doing well in recent echocardiogram showed normal functioning mitral valve with plan to repeat in 1 year. The patient is now pending procedure as outlined above. Since her last visit, she has been doing well with no new cardiac complaints.  She is staying active and walking when weather permits.  She is also taking care of her 65-year-old great granddaughter.  Past Medical History    Past Medical History:  Diagnosis Date   Anemia     Atrial fibrillation (HCC)    Atrial fibrillation with RVR (HCC) 2014   persistent   Cataract    Chronic diastolic CHF (congestive heart failure) (HCC)    HTN   Chronic diastolic congestive heart failure (HCC)    COVID-19 virus infection 01/20/2019   ESOPHAGEAL STRICTURE 02/19/2005   Qualifier: Diagnosis of  By: Delrae Alfred MD, Agapito Games    Heart murmur    History of anemia    unclear cause, now resolved   History of chicken pox    History of colon polyps    benign   HLD (hyperlipidemia)    Hypothyroidism    Malignant hypertension longstanding   Microcytosis    Mitral valve regurgitation    Osteopenia 12/2013   T -2.3 hip   Osteoporosis    Pulmonary hypertension (HCC)    Rheumatoid arthritis (HCC) 11/2015   +RF, +CCP, ESR 49, synovitis on exam and [place] 11/2015 Joanna Hood)   Right lower lobe pneumonia 07/24/2014   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   29 mm Medtronic Mosaic stented porcine bioprosthetic tissue valve   Tricuspid regurgitation    Vitamin D deficiency    Past Surgical History:  Procedure Laterality Date   CARDIAC CATHETERIZATION  2014   normal per patient   CLIPPING OF ATRIAL APPENDAGE N/A 10/28/2020   Procedure: CLIPPING OF ATRIAL APPENDAGE USING ATRICURE  CLIP SIZE ;  Surgeon: Purcell Nails, MD;  Location: [facility] OR;  Service: Open Heart Surgery;  Laterality: N/A;   COLONOSCOPY  11/2015   TA, int hem, rpt 5  yrs Joanna Hood)   COLONOSCOPY  05/2021   WNL no repeat recommended Joanna Hood)   LEFT HEART CATHETERIZATION WITH CORONARY ANGIOGRAM N/A 05/01/2013   Procedure: LEFT HEART CATHETERIZATION WITH CORONARY ANGIOGRAM;  Surgeon: Marykay Lex, MD;  Location: [facility] CATH LAB;  Service: Cardiovascular;  Laterality: N/A;   MITRAL VALVE REPAIR N/A 10/28/2020   Procedure: MITRAL VALVE REPLACEMENT (MVR) USING MEDTRONIC MOSAIC VALVE SIZE ;  Surgeon: Purcell Nails, MD;  Location: [facility] OR;  Service: Open Heart Surgery;  Laterality: N/A;    MULTIPLE EXTRACTIONS WITH ALVEOLOPLASTY N/A 10/24/2020   Procedure: MULTIPLE EXTRACTION WITH ALVEOLOPLASTY;  Surgeon: Sharman Cheek, DMD;  Location: [facility] OR;  Service: Dentistry;  Laterality: N/A;   MULTIPLE TOOTH EXTRACTIONS  10/2020   Extractions of teeth numbers 3, 4, 7, 8, 9, 10, 11 and 14 - prior to valvular surgery   PARTIAL HYSTERECTOMY  1970   fibroids   RIGHT HEART CATH N/A 10/25/2020   Procedure: RIGHT HEART CATH;  Surgeon: Dolores Patty, MD;  Location: [facility] INVASIVE CV LAB;  Service: Cardiovascular;  Laterality: N/A;   RIGHT/LEFT HEART CATH AND CORONARY ANGIOGRAPHY N/A 10/21/2020   Procedure: RIGHT/LEFT HEART CATH AND CORONARY ANGIOGRAPHY;  Surgeon: Runell Gess, MD;  Location: [facility] INVASIVE CV LAB;  Service: Cardiovascular;  Laterality: N/A;   TEE WITHOUT CARDIOVERSION N/A 10/21/2020   Procedure: TRANSESOPHAGEAL ECHOCARDIOGRAM (TEE);  Surgeon: Lewayne Bunting, MD;  Location: [facility] ENDOSCOPY;  Service: Cardiovascular;  Laterality: N/A;   TEE WITHOUT CARDIOVERSION N/A 10/28/2020   Procedure: TRANSESOPHAGEAL ECHOCARDIOGRAM (TEE);  Surgeon: Purcell Nails, MD;  Location: [facility] OR;  Service: Open Heart Surgery;  Laterality: N/A;    Allergies  Allergies  Allergen Reactions   Metronidazole Itching and Rash    Home Medications    Prior to Admission medications   Medication Sig Start Date End Date Taking? Authorizing Provider  acetaminophen (TYLENOL) 325 MG tablet Take 650 mg by mouth every 6 (six) hours as needed (pain).    [provider]  amLODipine (NORVASC) 10 MG tablet Take 1 tablet (10 mg total) by mouth daily. NEEDS FOLLOW UP APPOINTMENT FOR MORE REFILLS 10/26/22   Bensimhon, Bevelyn Buckles, MD  amoxicillin (AMOXIL) 500 MG tablet Take 4 tablets (2,000 mg total) by mouth as needed (1 hour prior to dental work). 09/07/22   Eustaquio Boyden, MD  aspirin EC 81 MG tablet Take 81 mg by mouth daily. Swallow whole. 10/21/20   [provider]  atorvastatin (LIPITOR) 20  MG tablet TAKE 1 TABLET(20 MG) BY MOUTH DAILY 09/15/22   Runell Gess, MD  Calcium Carb-Cholecalciferol (CALCIUM-VITAMIN D) 600-400 MG-UNIT TABS Take 1 tablet by mouth daily.    [provider]  cholecalciferol (VITAMIN D) 1000 UNITS tablet Take 1,000 Units by mouth daily.    [provider]  folic acid (FOLVITE) 1 MG tablet Take 1 tablet (1 mg total) by mouth daily. 12/06/20   Eustaquio Boyden, MD  furosemide (LASIX) 20 MG tablet TAKE 1 TABLET EVERY DAY 07/07/22   Runell Gess, MD  levothyroxine (SYNTHROID) 100 MCG tablet TAKE 1 TABLET EVERY DAY BEFORE BREAKFAST (NEW DOSE) 11/30/22   Eustaquio Boyden, MD  methotrexate (RHEUMATREX) 2.5 MG tablet Take 15 mg by mouth every Friday. Caution:Chemotherapy. Protect from light.    [provider]  omeprazole (PRILOSEC) 20 MG capsule TAKE 1 CAPSULE EVERY DAY 09/21/22   Hilarie Fredrickson, MD  sacubitril-valsartan (ENTRESTO) 97-103 MG Take 1 tablet by mouth 2 (two) times daily. NEEDS  FOLLOW UP APPOINTMENT FOR MORE REFILLS 11/30/22   Tonye Becket D, NP  spironolactone (ALDACTONE) 25 MG tablet TAKE 1 TABLET EVERY DAY 11/30/22   Eustaquio Boyden, MD  warfarin (COUMADIN) 4 MG tablet TAKE 1 TABLET EVERY DAY OR AS DIRECTED BY THE COUMADIN CLINIC 09/02/22   Runell Gess, MD    Physical Exam    Vital Signs:  Eugenia Mcalpine does not have vital signs available for review today. 130/60  Given telephonic nature of communication, physical exam is limited. AAOx3. NAD. Normal affect.  Speech and respirations are unlabored.  Accessory Clinical Findings    None  Assessment & Plan    1.  Preoperative Cardiovascular Risk Assessment:  Patient's RCRI score is 0.9%  The patient affirms she has been doing well without any new cardiac symptoms. They are able to achieve 6 METS without cardiac limitations. Therefore, based on ACC/AHA guidelines, the patient would be at acceptable risk for the planned procedure without further  cardiovascular testing. The patient was advised that if she develops new symptoms prior to surgery to contact our office to arrange for a follow-up visit, and she verbalized understanding.  The patient was advised that if she develops new symptoms prior to surgery to contact our office to arrange for a follow-up visit, and she verbalized understanding.  Patient can hold warfarin for 5 days prior to procedure and ASA 7 days prior to procedure  Patient will also need SBE prophylaxis prior to completing her extraction procedure and has prescription currently at pharmacy waiting.   A copy of this note will be routed to requesting surgeon.  Time:   Today, I have spent 6 minutes with the patient with telehealth technology discussing medical history, symptoms, and management plan.     Napoleon Form, Leodis Rains, NP  12/22/2022, 7:10 AM

## 2022-12-22 NOTE — Patient Instructions (Signed)
Continue taking warfarin 1 tablet daily except 1/2 tablet on Sundays. Recheck 4 weeks. Coumadin Clinic [phone] Fax Clearance Forms to [phone] or [phone]

## 2023-01-07 ENCOUNTER — Other Ambulatory Visit (HOSPITAL_COMMUNITY): Payer: Self-pay | Admitting: Internal Medicine

## 2023-01-12 DIAGNOSIS — M0579 Rheumatoid arthritis with rheumatoid factor of multiple sites without organ or systems involvement: Secondary | ICD-10-CM | POA: Diagnosis not present

## 2023-01-13 ENCOUNTER — Ambulatory Visit (HOSPITAL_COMMUNITY): Payer: Medicare HMO | Attending: Cardiovascular Disease

## 2023-01-13 DIAGNOSIS — I5032 Chronic diastolic (congestive) heart failure: Secondary | ICD-10-CM | POA: Insufficient documentation

## 2023-01-13 DIAGNOSIS — I10 Essential (primary) hypertension: Secondary | ICD-10-CM | POA: Insufficient documentation

## 2023-01-13 DIAGNOSIS — I340 Nonrheumatic mitral (valve) insufficiency: Secondary | ICD-10-CM | POA: Diagnosis not present

## 2023-01-13 DIAGNOSIS — I4819 Other persistent atrial fibrillation: Secondary | ICD-10-CM | POA: Insufficient documentation

## 2023-01-13 LAB — ECHOCARDIOGRAM COMPLETE
Area-P 1/2: 4.15 cm2
MV VTI: 1.12 cm2
S' Lateral: 3 cm

## 2023-01-18 ENCOUNTER — Other Ambulatory Visit (HOSPITAL_COMMUNITY): Payer: Self-pay

## 2023-01-18 DIAGNOSIS — I10 Essential (primary) hypertension: Secondary | ICD-10-CM

## 2023-01-18 DIAGNOSIS — Z953 Presence of xenogenic heart valve: Secondary | ICD-10-CM

## 2023-01-19 ENCOUNTER — Ambulatory Visit: Payer: Medicare HMO | Attending: Cardiovascular Disease

## 2023-01-19 DIAGNOSIS — I4819 Other persistent atrial fibrillation: Secondary | ICD-10-CM | POA: Diagnosis not present

## 2023-01-19 DIAGNOSIS — Z953 Presence of xenogenic heart valve: Secondary | ICD-10-CM

## 2023-01-19 DIAGNOSIS — Z7901 Long term (current) use of anticoagulants: Secondary | ICD-10-CM

## 2023-01-19 LAB — POCT INR: INR: 2.5 (ref 2.0–3.0)

## 2023-01-19 NOTE — Patient Instructions (Signed)
Continue taking warfarin 1 tablet daily except 1/2 tablet on Sundays. Recheck 3 weeks. Coumadin Clinic [phone] Fax Clearance Forms to [phone] or [phone]; Dental extractions 8/13; HOLD COUMADIN 8/8-8/12

## 2023-01-20 ENCOUNTER — Other Ambulatory Visit: Payer: Self-pay | Admitting: Cardiovascular Disease

## 2023-01-20 DIAGNOSIS — I4819 Other persistent atrial fibrillation: Secondary | ICD-10-CM

## 2023-01-20 NOTE — Telephone Encounter (Signed)
Warfarin 4mg  refill S/P mitral valve replacement with bioprosthetic valve  Last INR 01/19/23 Last OV 12/22/22

## 2023-02-05 ENCOUNTER — Telehealth: Payer: Self-pay | Admitting: Cardiovascular Disease

## 2023-02-05 ENCOUNTER — Telehealth: Payer: Self-pay

## 2023-02-05 NOTE — Telephone Encounter (Signed)
Returned pt call to

## 2023-02-05 NOTE — Telephone Encounter (Signed)
Pt calling after her dental extraction she wants to know when she should go back on her warfarin. Please advise.

## 2023-02-05 NOTE — Telephone Encounter (Signed)
I spoke to patient and told her to resume Warfarin after her Dental extractions.  She verbalized understanding

## 2023-02-05 NOTE — Telephone Encounter (Signed)
Returned call to pt to inform her that I will send this to her provider for recommendation. Informed once we hear back we will give her a call. LVM

## 2023-02-05 NOTE — Telephone Encounter (Signed)
Patient is returning call.  °

## 2023-02-09 ENCOUNTER — Ambulatory Visit: Payer: Medicare HMO | Attending: Cardiovascular Disease

## 2023-02-09 DIAGNOSIS — I4819 Other persistent atrial fibrillation: Secondary | ICD-10-CM

## 2023-02-09 DIAGNOSIS — Z7901 Long term (current) use of anticoagulants: Secondary | ICD-10-CM

## 2023-02-09 DIAGNOSIS — Z953 Presence of xenogenic heart valve: Secondary | ICD-10-CM | POA: Diagnosis not present

## 2023-02-09 LAB — POCT INR: INR: 1.3 — AB (ref 2.0–3.0)

## 2023-02-09 NOTE — Patient Instructions (Signed)
TAKE 2 TABLETS TODAY ONLY THEN Continue taking warfarin 1 tablet daily except 1/2 tablet on Sundays. Recheck 3 weeks. Coumadin Clinic [phone] Fax Clearance Forms to [phone] or [phone]

## 2023-02-13 ENCOUNTER — Other Ambulatory Visit (HOSPITAL_COMMUNITY): Payer: Self-pay | Admitting: Adult Health

## 2023-02-13 DIAGNOSIS — I10 Essential (primary) hypertension: Secondary | ICD-10-CM

## 2023-02-16 ENCOUNTER — Encounter: Payer: Self-pay | Admitting: Family Medicine

## 2023-02-16 NOTE — Telephone Encounter (Signed)
LOV: 10/07/22  Last refill 09/21/22 #90 2/ no refill by Dr. Marina Goodell

## 2023-02-24 MED ORDER — OMEPRAZOLE 20 MG PO CPDR: 20.0000 mg | DELAYED_RELEASE_CAPSULE | Freq: Every day | ORAL | 1 refills | Status: DC

## 2023-03-02 ENCOUNTER — Ambulatory Visit: Payer: Medicare HMO | Attending: Cardiovascular Disease | Admitting: *Deleted

## 2023-03-02 DIAGNOSIS — I4819 Other persistent atrial fibrillation: Secondary | ICD-10-CM | POA: Diagnosis not present

## 2023-03-02 DIAGNOSIS — Z7901 Long term (current) use of anticoagulants: Secondary | ICD-10-CM | POA: Diagnosis not present

## 2023-03-02 DIAGNOSIS — Z953 Presence of xenogenic heart valve: Secondary | ICD-10-CM | POA: Diagnosis not present

## 2023-03-02 LAB — POCT INR: INR: 2.6 (ref 2.0–3.0)

## 2023-03-02 NOTE — Patient Instructions (Signed)
Description   Continue taking warfarin 1 tablet daily except 1/2 tablet on Sundays. Recheck 4 weeks. Coumadin Clinic [phone] Fax Clearance Forms to [phone] or [phone]

## 2023-03-30 ENCOUNTER — Ambulatory Visit: Payer: Medicare HMO | Attending: Cardiovascular Disease

## 2023-03-30 DIAGNOSIS — I4819 Other persistent atrial fibrillation: Secondary | ICD-10-CM | POA: Diagnosis not present

## 2023-03-30 DIAGNOSIS — Z953 Presence of xenogenic heart valve: Secondary | ICD-10-CM | POA: Diagnosis not present

## 2023-03-30 DIAGNOSIS — Z7901 Long term (current) use of anticoagulants: Secondary | ICD-10-CM | POA: Diagnosis not present

## 2023-03-30 LAB — POCT INR: INR: 3.1 — AB (ref 2.0–3.0)

## 2023-03-30 NOTE — Patient Instructions (Signed)
Continue taking warfarin 1 tablet daily except 1/2 tablet on Sundays.  Eat greens tonight. Recheck 4 weeks. Coumadin Clinic [phone] Fax Clearance Forms to [phone] or [phone]

## 2023-04-09 ENCOUNTER — Other Ambulatory Visit (HOSPITAL_COMMUNITY): Payer: Self-pay | Admitting: Adult Health

## 2023-04-09 ENCOUNTER — Ambulatory Visit: Payer: Medicare HMO | Admitting: Family Medicine

## 2023-04-09 ENCOUNTER — Encounter: Payer: Self-pay | Admitting: Family Medicine

## 2023-04-09 VITALS — BP 123/66 | HR 66 | Temp 97.9°F | Ht 62.0 in | Wt 128.4 lb

## 2023-04-09 DIAGNOSIS — I10 Essential (primary) hypertension: Secondary | ICD-10-CM

## 2023-04-09 DIAGNOSIS — Z23 Encounter for immunization: Secondary | ICD-10-CM | POA: Diagnosis not present

## 2023-04-09 DIAGNOSIS — I5032 Chronic diastolic (congestive) heart failure: Secondary | ICD-10-CM | POA: Diagnosis not present

## 2023-04-09 DIAGNOSIS — Z953 Presence of xenogenic heart valve: Secondary | ICD-10-CM

## 2023-04-09 DIAGNOSIS — I4819 Other persistent atrial fibrillation: Secondary | ICD-10-CM | POA: Diagnosis not present

## 2023-04-09 NOTE — Assessment & Plan Note (Signed)
Chronic BP elevated in office - component of white coat hypertension however home readings largely well controlled - will continue using home readings to titrate antihypertensives - home cuff previously compared with our office cuff.

## 2023-04-09 NOTE — Progress Notes (Signed)
Ph: [phone] Fax: [phone]   Patient ID: Joanna Hood, female    DOB: Apr 15, 1946, 77 y.o.   MRN: [id number]  This visit was conducted in person.  BP 123/66 (BP Location: Right Arm, Cuff Size: Normal) Comment: at home this morning  Pulse 66   Temp 97.9 F (36.6 C) (Oral)   Ht 5\' 2"  (1.575 m)   Wt 128 lb 6 oz (58.2 kg)   SpO2 99%   BMI 23.48 kg/m   BP Readings from Last 3 Encounters:  04/09/23 123/66  10/07/22 115/60  04/20/22 (!) 148/66   CC: HTN f/u visit  Subjective:   HPI: Joanna Hood is a 77 y.o. female presenting on 04/09/2023 for Medical Management of Chronic Issues (Here for 6 mo HTN f/u.)   H/o bioprosthetic mitral valve replacement for severe MR with clipped atrial appendage by Dr Cornelius Moras (2022), h/o G3DD with severe LA enlargement, persistent afib on chronic coumadin through cardiology office. Needs SBE ppx.   HTN - component of white coat hypertension. compliant with current antihypertensive regimen of amlodipine 10mg  daily, lasix 20mg  daily, entresto 97/103 bid through cardiology and spironolactone 25mg  daily.  Does check blood pressures at home: 123/66 this morning. No low blood pressure readings or symptoms of dizziness/syncope. Denies HA, vision changes, CP/tightness, SOB, leg swelling.   RA - on MTX 15mg  weekly sees rheum Q6 mo ([place])   GERD - previously on omeprazole 20mg  daily through GI - in interim, released from GI care. We have started filling this medication.  EGD 2022 - gastric erosions, started on omeprazole.      Relevant past medical, surgical, family and social history reviewed and updated as indicated. Interim medical history since our last visit reviewed. Allergies and medications reviewed and updated. Outpatient Medications Prior to Visit  Medication Sig Dispense Refill   acetaminophen (TYLENOL) 325 MG tablet Take 650 mg by mouth every 6 (six) hours as needed (pain).     amLODipine (NORVASC) 10 MG tablet TAKE 1  TABLET DAILY (NEED MD APPOINTMENT FOR REFILLS) 90 tablet 3   amoxicillin (AMOXIL) 500 MG tablet Take 4 tablets (2,000 mg total) by mouth as needed (1 hour prior to dental work). 4 tablet 3   aspirin EC 81 MG tablet Take 81 mg by mouth daily. Swallow whole.     atorvastatin (LIPITOR) 20 MG tablet TAKE 1 TABLET(20 MG) BY MOUTH DAILY 90 tablet 3   Calcium Carb-Cholecalciferol (CALCIUM-VITAMIN D) 600-400 MG-UNIT TABS Take 1 tablet by mouth daily.     cholecalciferol (VITAMIN D) 1000 UNITS tablet Take 1,000 Units by mouth daily.     folic acid (FOLVITE) 1 MG tablet Take 1 tablet (1 mg total) by mouth daily. 90 tablet 3   furosemide (LASIX) 20 MG tablet TAKE 1 TABLET EVERY DAY 90 tablet 3   levothyroxine (SYNTHROID) 100 MCG tablet TAKE 1 TABLET EVERY DAY BEFORE BREAKFAST (NEW DOSE) 90 tablet 3   methotrexate (RHEUMATREX) 2.5 MG tablet Take 15 mg by mouth every Friday. Caution:Chemotherapy. Protect from light.     omeprazole (PRILOSEC) 20 MG capsule Take 1 capsule (20 mg total) by mouth daily. 90 capsule 1   spironolactone (ALDACTONE) 25 MG tablet TAKE 1 TABLET EVERY DAY 90 tablet 3   warfarin (COUMADIN) 4 MG tablet TAKE 1 TABLET EVERY DAY OR AS DIRECTED BY THE COUMADIN CLINIC 100 tablet 1   sacubitril-valsartan (ENTRESTO) 97-103 MG TAKE 1 TABLET TWICE DAILY (NEEDS FOLLOW UP APPOINTMENT FOR MORE REFILLS) 60 tablet 1  Facility-Administered Medications Prior to Visit  Medication Dose Route Frequency Provider Last Rate Last Admin   0.9 %  sodium chloride infusion  500 mL Intravenous Once Hilarie Fredrickson, MD         Per HPI unless specifically indicated in ROS section below Review of Systems  Objective:  BP 123/66 (BP Location: Right Arm, Cuff Size: Normal) Comment: at home this morning  Pulse 66   Temp 97.9 F (36.6 C) (Oral)   Ht 5\' 2"  (1.575 m)   Wt 128 lb 6 oz (58.2 kg)   SpO2 99%   BMI 23.48 kg/m   Wt Readings from Last 3 Encounters:  04/09/23 128 lb 6 oz (58.2 kg)  10/07/22 135 lb 6 oz  (61.4 kg)  09/30/22 131 lb (59.4 kg)      Physical Exam Vitals and nursing note reviewed.  Constitutional:      Appearance: Normal appearance. She is not ill-appearing or diaphoretic.  HENT:     Mouth/Throat:     Mouth: Mucous membranes are moist.     Pharynx: Oropharynx is clear. No oropharyngeal exudate or posterior oropharyngeal erythema.  Eyes:     Extraocular Movements: Extraocular movements intact.     Pupils: Pupils are equal, round, and reactive to light.  Neck:     Thyroid: No thyroid mass or thyromegaly.  Cardiovascular:     Rate and Rhythm: Normal rate. Rhythm irregularly irregular.     Pulses: Normal pulses.     Heart sounds: Murmur (3/6 systolic throughout) heard.  Pulmonary:     Effort: Pulmonary effort is normal. No respiratory distress.     Breath sounds: Normal breath sounds. No wheezing, rhonchi or rales.  Musculoskeletal:     Cervical back: Normal range of motion and neck supple.     Right lower leg: No edema.     Left lower leg: No edema.  Skin:    General: Skin is warm and dry.     Findings: No rash.  Neurological:     Mental Status: She is alert.  Psychiatric:        Mood and Affect: Mood normal.        Behavior: Behavior normal.       Results for orders placed or performed in visit on 03/30/23  POCT INR  Result Value Ref Range   INR 3.1 (A) 2.0 - 3.0   POC INR     Lab Results  Component Value Date   NA 140 09/30/2022   CL 104 09/30/2022   K 3.8 09/30/2022   CO2 25 09/30/2022   BUN 26 (H) 09/30/2022   CREATININE 1.12 09/30/2022   GFR 48.16 (L) 09/30/2022   CALCIUM 9.6 09/30/2022   PHOS 3.8 11/12/2020   ALBUMIN 4.4 09/30/2022   GLUCOSE 101 (H) 09/30/2022   Lab Results  Component Value Date   TSH 0.45 09/30/2022   Assessment & Plan:   Problem List Items Addressed This Visit     Persistent atrial fibrillation (HCC)   White coat syndrome with hypertension - Primary    Chronic BP elevated in office - component of white coat  hypertension however home readings largely well controlled - will continue using home readings to titrate antihypertensives - home cuff previously compared with our office cuff.       Chronic heart failure with preserved ejection fraction (HFpEF) (HCC)    Appreciate cardiology care, on entresto, lasix, spironolactone      S/P mitral valve replacement with bioprosthetic valve  Continues coumadin through cardiology coumadin clinic.       Other Visit Diagnoses     Encounter for immunization       Relevant Orders   Flu Vaccine Trivalent High Dose (Fluad) (Completed)        No orders of the defined types were placed in this encounter.   Orders Placed This Encounter  Procedures   Flu Vaccine Trivalent High Dose (Fluad)    Patient Instructions  Flu shot today Try taking omeprazole 20mg  every other day.  Call to schedule cardiology appointment for next month.  Continue monitoring blood pressures at home, let me know if consistently >140/90.   Follow up plan: Return in about 6 months (around 10/08/2023) for annual exam, prior fasting for blood work.  Eustaquio Boyden, MD

## 2023-04-09 NOTE — Patient Instructions (Addendum)
Flu shot today Try taking omeprazole 20mg  every other day.  Call to schedule cardiology appointment for next month.  Continue monitoring blood pressures at home, let me know if consistently >140/90.

## 2023-04-09 NOTE — Assessment & Plan Note (Signed)
Appreciate cardiology care, on entresto, lasix, spironolactone

## 2023-04-09 NOTE — Assessment & Plan Note (Signed)
Continues coumadin through cardiology coumadin clinic.

## 2023-04-13 DIAGNOSIS — M0579 Rheumatoid arthritis with rheumatoid factor of multiple sites without organ or systems involvement: Secondary | ICD-10-CM | POA: Diagnosis not present

## 2023-04-13 DIAGNOSIS — M1991 Primary osteoarthritis, unspecified site: Secondary | ICD-10-CM | POA: Diagnosis not present

## 2023-04-13 DIAGNOSIS — Z79899 Other long term (current) drug therapy: Secondary | ICD-10-CM | POA: Diagnosis not present

## 2023-04-13 DIAGNOSIS — Z1589 Genetic susceptibility to other disease: Secondary | ICD-10-CM | POA: Diagnosis not present

## 2023-04-13 DIAGNOSIS — Z6821 Body mass index (BMI) 21.0-21.9, adult: Secondary | ICD-10-CM | POA: Diagnosis not present

## 2023-04-27 ENCOUNTER — Ambulatory Visit: Payer: Medicare HMO | Attending: Cardiovascular Disease

## 2023-04-27 DIAGNOSIS — Z953 Presence of xenogenic heart valve: Secondary | ICD-10-CM | POA: Diagnosis not present

## 2023-04-27 DIAGNOSIS — Z7901 Long term (current) use of anticoagulants: Secondary | ICD-10-CM

## 2023-04-27 DIAGNOSIS — I4819 Other persistent atrial fibrillation: Secondary | ICD-10-CM | POA: Diagnosis not present

## 2023-04-27 LAB — POCT INR: INR: 2.2 (ref 2.0–3.0)

## 2023-04-27 NOTE — Patient Instructions (Signed)
Continue taking warfarin 1 tablet daily except 1/2 tablet on Sundays.  Recheck 6 weeks. Coumadin Clinic [phone] Fax Clearance Forms to [phone] or [phone]

## 2023-04-30 ENCOUNTER — Other Ambulatory Visit: Payer: Self-pay | Admitting: Cardiovascular Disease

## 2023-06-02 ENCOUNTER — Other Ambulatory Visit (HOSPITAL_COMMUNITY): Payer: Self-pay | Admitting: Internal Medicine

## 2023-06-02 DIAGNOSIS — I10 Essential (primary) hypertension: Secondary | ICD-10-CM

## 2023-06-08 ENCOUNTER — Ambulatory Visit: Payer: Medicare HMO | Attending: Cardiology | Admitting: *Deleted

## 2023-06-08 DIAGNOSIS — Z7901 Long term (current) use of anticoagulants: Secondary | ICD-10-CM

## 2023-06-08 DIAGNOSIS — I4819 Other persistent atrial fibrillation: Secondary | ICD-10-CM

## 2023-06-08 DIAGNOSIS — Z953 Presence of xenogenic heart valve: Secondary | ICD-10-CM | POA: Diagnosis not present

## 2023-06-08 LAB — POCT INR: INR: 2.1 (ref 2.0–3.0)

## 2023-06-08 NOTE — Patient Instructions (Signed)
Description   Continue taking warfarin 1 tablet daily except 1/2 tablet on Sundays. Recheck 6 weeks. Coumadin Clinic [phone] Fax Clearance Forms to [phone] or [phone]

## 2023-06-13 ENCOUNTER — Other Ambulatory Visit: Payer: Self-pay | Admitting: Cardiovascular Disease

## 2023-06-13 DIAGNOSIS — I4819 Other persistent atrial fibrillation: Secondary | ICD-10-CM

## 2023-07-06 ENCOUNTER — Encounter: Payer: Self-pay | Admitting: Cardiovascular Disease

## 2023-07-06 ENCOUNTER — Ambulatory Visit: Payer: Medicare PPO | Attending: Cardiovascular Disease | Admitting: Cardiovascular Disease

## 2023-07-06 VITALS — BP 152/58 | HR 46 | Ht 62.0 in | Wt 128.4 lb

## 2023-07-06 DIAGNOSIS — I10 Essential (primary) hypertension: Secondary | ICD-10-CM | POA: Diagnosis not present

## 2023-07-06 DIAGNOSIS — I4819 Other persistent atrial fibrillation: Secondary | ICD-10-CM | POA: Diagnosis not present

## 2023-07-06 DIAGNOSIS — I5032 Chronic diastolic (congestive) heart failure: Secondary | ICD-10-CM | POA: Diagnosis not present

## 2023-07-06 DIAGNOSIS — I340 Nonrheumatic mitral (valve) insufficiency: Secondary | ICD-10-CM

## 2023-07-06 MED ORDER — AMLODIPINE BESYLATE 10 MG PO TABS: 10.0000 mg | ORAL_TABLET | Freq: Every day | ORAL | 3 refills | Status: DC

## 2023-07-06 NOTE — Assessment & Plan Note (Signed)
 History of persistent A-fib rate controlled on Coumadin anticoagulation.

## 2023-07-06 NOTE — Patient Instructions (Signed)
 Medication Instructions:  Your physician recommends that you continue on your current medications as directed. Please refer to the Current Medication list given to you today.  *If you need a refill on your cardiac medications before your next appointment, please call your pharmacy*   Testing/Procedures: Your physician has requested that you have an echocardiogram. Echocardiography is a painless test that uses sound waves to create images of your heart. It provides your doctor with information about the size and shape of your heart and how well your heart's chambers and valves are working. This procedure takes approximately one hour. There are no restrictions for this procedure. Please do NOT wear cologne, perfume, aftershave, or lotions (deodorant is allowed). Please arrive 15 minutes prior to your appointment time. **To be done in July**  Please note: We ask at that you not bring children with you during ultrasound (echo/ vascular) testing. Due to room size and safety concerns, children are not allowed in the ultrasound rooms during exams. Our front office staff cannot provide observation of children in our lobby area while testing is being conducted. An adult accompanying a patient to their appointment will only be allowed in the ultrasound room at the discretion of the ultrasound technician under special circumstances. We apologize for any inconvenience.    Follow-Up: At [facility], you and your health needs are our priority.  As part of our continuing mission to provide you with exceptional heart care, we have created designated Provider Care Teams.  These Care Teams include your primary Cardiologist (physician) and Advanced Practice Providers (APPs -  Physician Assistants and Nurse Practitioners) who all work together to provide you with the care you need, when you need it.  We recommend signing up for the patient portal called MyChart.  Sign up information is provided on this  After Visit Summary.  MyChart is used to connect with patients for Virtual Visits (Telemedicine).  Patients are able to view lab/test results, encounter notes, upcoming appointments, etc.  Non-urgent messages can be sent to your provider as well.   To learn more about what you can do with MyChart, go to [email or web address].    Your next appointment:   12 month(s)  Provider:   Dorn Lesches, MD   Other Instructions

## 2023-07-06 NOTE — Progress Notes (Signed)
 07/06/2023 Joanna Hood   Mar 20, 1946  [id number]  Primary Physician Joanna Baller, MD Primary Cardiologist: Joanna [name] MD [name], [place]  HPI:  Joanna Hood is a 78 y.o.    moderately overweight African-American female who I initially met several years ago. I last saw her in the office 04/20/2022.   We first met when she was admitted to [facility] with diastolic heart failure, hypertension and atrial fibrillation. She ultimately underwent cardiac catheterization revealing essentially normal coronary arteries and normal LV function by Dr. Anner. Her A. Fib converted to sinus rhythm with the aid of IV Cardizem . Her blood pressure was better controlled. She saw Joanna Shed PA-C in the office  and again was in atrial fibrillation at which time her beta blocker was adjusted and Eliquis  was added because of increased CHA2DSVASC2 score of 2. Since I saw her in the office she remained clinically stable. She denies chest pain or shortness of breath.   She remains in A. fib on Eliquis  oral anticoagulation.  She did contract COVID-19 in late July 2020 and completed her quarantine a week ago.  She had mild flulike symptoms.  She did have a 2D echo performed 05/08/2015 that showed normal LV function with moderate mitral regurgitation and severe left atrial enlargement.    She was complaining of dyspnea on exertion.  Her 2D echo confirmed severe MR.  I ultimately performed right and left heart cath on her 65/2/22 revealing normal coronary arteries and severe MR.  1 week later she underwent mitral valve replacement by Dr. Dusty via median sternotomy on 10/28/2020.  She had a Medtronic Mosaic stented porcine bioprosthetic tissue valve placed (29 mm) along with a clipping of her left atrial appendage.  Her postop course was somewhat protracted.  She was discharged home 1 week later.  She was difficult to wean off pressors postoperatively.  She has seen Dr. Cherrie,  her heart failure cardiologist, and was doing well at that time.  She is participating cardiac rehab.  She no longer has peripheral edema like she did preoperatively.  She is on Coumadin  anticoagulation.   Since I saw her a year ago she continues to do well.  She is very active.  She denies chest pain or shortness of breath.  She has no symptoms of heart failure.  Her most recent 2D echo performed 01/09/2023 revealed normal LV systolic function, mildly reduced RV systolic function with a well-functioning mitral bioprosthesis.  There is no MR.  There was severely dilated left atrium.  Pulmonary pressures were mildly elevated at 43 mm.   Current Meds  Medication Sig   acetaminophen  (TYLENOL ) 325 MG tablet Take 650 mg by mouth every 6 (six) hours as needed (pain).   amLODipine  (NORVASC ) 10 MG tablet TAKE 1 TABLET DAILY (NEED MD APPOINTMENT FOR REFILLS)   amoxicillin  (AMOXIL ) 500 MG tablet Take 4 tablets (2,000 mg total) by mouth as needed (1 hour prior to dental work).   aspirin  EC 81 MG tablet Take 81 mg by mouth daily. Swallow whole.   atorvastatin  (LIPITOR) 20 MG tablet TAKE 1 TABLET(20 MG) BY MOUTH DAILY   Calcium  Carb-Cholecalciferol  (CALCIUM -VITAMIN D ) 600-400 MG-UNIT TABS Take 1 tablet by mouth daily.   cholecalciferol  (VITAMIN D ) 1000 UNITS tablet Take 1,000 Units by mouth daily.   folic acid  (FOLVITE ) 1 MG tablet Take 1 tablet (1 mg total) by mouth daily.   furosemide  (LASIX ) 20 MG tablet TAKE 1 TABLET EVERY DAY  levothyroxine  (SYNTHROID ) 100 MCG tablet TAKE 1 TABLET EVERY DAY BEFORE BREAKFAST (NEW DOSE)   methotrexate  (RHEUMATREX) 2.5 MG tablet Take 15 mg by mouth every Friday. Caution:Chemotherapy. Protect from light.   omeprazole  (PRILOSEC) 20 MG capsule Take 1 capsule (20 mg total) by mouth daily.   sacubitril -valsartan  (ENTRESTO ) 97-103 MG TAKE 1 TABLET TWICE DAILY (NEEDS FOLLOW UP APPOINTMENT FOR MORE REFILLS)   spironolactone  (ALDACTONE ) 25 MG tablet TAKE 1 TABLET EVERY DAY    warfarin (COUMADIN ) 4 MG tablet TAKE 1 TABLET EVERY DAY OR AS DIRECTED BY THE COUMADIN  CLINIC   Current Facility-Administered Medications for the 07/06/23 encounter (Office Visit) with Joanna Joanna PARAS, MD  Medication   0.9 %  sodium chloride  infusion     Allergies  Allergen Reactions   Metronidazole Itching and Rash    Social History   Socioeconomic History   Marital status: Single    Spouse name: Not on file   Number of children: 1   Years of education: Not on file   Highest education level: Not on file  Occupational History   Occupation: Retired  Tobacco Use   Smoking status: Former    Current packs/day: 0.00    Types: Cigarettes    Quit date: 05/16/1969    Years since quitting: 54.1   Smokeless tobacco: Former  Building Services Engineer status: Never Used  Substance and Sexual Activity   Alcohol use: No    Alcohol/week: 0.0 standard drinks of alcohol   Drug use: No   Sexual activity: Not Currently  Other Topics Concern   Not on file  Social History Narrative   Lives with grand-daughter and godson   Occupation: retired, 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   Edu: HS   Social Drivers of Corporate Investment Banker Strain: Low Risk  (07/07/2022)   Overall Financial Resource Strain (CARDIA)    Difficulty of Paying Living Expenses: Not hard at all  Food Insecurity: No Food Insecurity (09/30/2022)   Hunger Vital Sign    Worried About Running Out of Food in the Last Year: Never true    Ran Out of Food in the Last Year: Never true  Transportation Needs: No Transportation Needs (07/07/2022)   PRAPARE - Administrator, Civil Service (Medical): No    Lack of Transportation (Non-Medical): No  Physical Activity: Sufficiently Active (09/30/2022)   Exercise Vital Sign    Days of Exercise per Week: 3 days    Minutes of Exercise per Session: 50 min  Stress: No Stress Concern Present (09/05/2021)   [employer] of Occupational Health -  Occupational Stress Questionnaire    Feeling of Stress : Not at all  Social Connections: Moderately Isolated (09/30/2022)   Social Connection and Isolation Panel [NHANES]    Frequency of Communication with Friends and Family: More than three times a week    Frequency of Social Gatherings with Friends and Family: More than three times a week    Attends Religious Services: More than 4 times per year    Active Member of [employer] or Organizations: No    Attends Banker Meetings: Never    Marital Status: Never married  Intimate Partner Violence: Not At Risk (09/30/2022)   Humiliation, Afraid, Rape, and Kick questionnaire    Fear of Current or Ex-Partner: No    Emotionally Abused: No    Physically Abused: No    Sexually Abused: No     Review of Systems: General:  negative for chills, fever, night sweats or weight changes.  Cardiovascular: negative for chest pain, dyspnea on exertion, edema, orthopnea, palpitations, paroxysmal nocturnal dyspnea or shortness of breath Dermatological: negative for rash Respiratory: negative for cough or wheezing Urologic: negative for hematuria Abdominal: negative for nausea, vomiting, diarrhea, bright red blood per rectum, melena, or hematemesis Neurologic: negative for visual changes, syncope, or dizziness All other systems reviewed and are otherwise negative except as noted above.    Blood pressure (!) 152/58, pulse (!) 46, height 5' 2 (1.575 m), weight 128 lb 6.4 oz (58.2 kg), SpO2 (!) 88%.  General appearance: alert and no distress Neck: no adenopathy, no carotid bruit, no JVD, supple, symmetrical, trachea midline, and thyroid  not enlarged, symmetric, no tenderness/mass/nodules Lungs: clear to auscultation bilaterally Heart: irregularly irregular rhythm and soft outflow tract murmur Extremities: extremities normal, atraumatic, no cyanosis or edema Pulses: 2+ and symmetric Skin: Skin color, texture, turgor normal. No rashes or  lesions Neurologic: Grossly normal  EKG EKG Interpretation Date/Time:  Tuesday July 06 2023 09:09:45 EST Ventricular Rate:  46 PR Interval:    QRS Duration:  90 QT Interval:  370 QTC Calculation: 323 R Axis:   2  Text Interpretation: Atrial fibrillation with slow ventricular response with premature ventricular or aberrantly conducted complexes ST & T wave abnormality, consider lateral ischemia When compared with ECG of 06-Feb-2021 15:18, Questionable change in QRS axis QT has shortened Confirmed by Joanna Carrier [id number]) on 07/06/2023 9:16:45 AM    ASSESSMENT AND PLAN:   Mitral valve regurgitation History of severe mitral regurgitation status post right left heart cath by myself 11/21/2020 revealing normal coronary arteries and severe MR.  1 week later she underwent mitral valve replacement by Dr. Nguyen via median sternotomy 10/28/2020.  She had a Medtronic Mosaic stented porcine bioprosthesis tissue valve (20 mm millimeter) placed along with clipping of her left atrial appendage.  Her postop course was somewhat rocky.  She has done well since.  She denies chest pain or shortness of breath.  Her most recent 2D echo performed 01/09/2023 revealed normal LV systolic function with a well-functioning mitral bioprosthesis.  Persistent atrial fibrillation (HCC) History of persistent A-fib rate controlled on Coumadin  anticoagulation.     Carrier DOROTHA Court MD FACP,FACC,FAHA, [facility] 07/06/2023 9:24 AM

## 2023-07-06 NOTE — Addendum Note (Signed)
 Addended by: Bernita Buffy on: 07/06/2023 10:38 AM   Modules accepted: Orders

## 2023-07-06 NOTE — Assessment & Plan Note (Signed)
 History of severe mitral regurgitation status post right left heart cath by myself 11/21/2020 revealing normal coronary arteries and severe MR.  1 week later she underwent mitral valve replacement by Dr. Nguyen via median sternotomy 10/28/2020.  She had a Medtronic Mosaic stented porcine bioprosthesis tissue valve (20 mm millimeter) placed along with clipping of her left atrial appendage.  Her postop course was somewhat rocky.  She has done well since.  She denies chest pain or shortness of breath.  Her most recent 2D echo performed 01/09/2023 revealed normal LV systolic function with a well-functioning mitral bioprosthesis.

## 2023-07-20 ENCOUNTER — Ambulatory Visit: Payer: Medicare PPO | Attending: Cardiology

## 2023-07-20 ENCOUNTER — Other Ambulatory Visit: Payer: Self-pay | Admitting: Family Medicine

## 2023-07-20 DIAGNOSIS — Z1231 Encounter for screening mammogram for malignant neoplasm of breast: Secondary | ICD-10-CM

## 2023-07-20 DIAGNOSIS — Z7901 Long term (current) use of anticoagulants: Secondary | ICD-10-CM | POA: Diagnosis not present

## 2023-07-20 DIAGNOSIS — I4819 Other persistent atrial fibrillation: Secondary | ICD-10-CM | POA: Diagnosis not present

## 2023-07-20 DIAGNOSIS — Z953 Presence of xenogenic heart valve: Secondary | ICD-10-CM | POA: Diagnosis not present

## 2023-07-20 LAB — POCT INR: INR: 2 (ref 2.0–3.0)

## 2023-07-20 NOTE — Patient Instructions (Signed)
Continue taking warfarin 1 tablet daily except 1/2 tablet on Sundays.  Recheck 6 weeks. Coumadin Clinic [phone] Fax Clearance Forms to [phone] or [phone]

## 2023-07-21 ENCOUNTER — Other Ambulatory Visit: Payer: Self-pay | Admitting: Family Medicine

## 2023-08-03 ENCOUNTER — Ambulatory Visit (INDEPENDENT_AMBULATORY_CARE_PROVIDER_SITE_OTHER): Payer: Medicare PPO | Admitting: General Practice

## 2023-08-03 ENCOUNTER — Encounter: Payer: Self-pay | Admitting: General Practice

## 2023-08-03 VITALS — BP 120/80 | HR 99 | Temp 98.0°F | Ht 62.0 in | Wt 117.0 lb

## 2023-08-03 DIAGNOSIS — R051 Acute cough: Secondary | ICD-10-CM | POA: Diagnosis not present

## 2023-08-03 LAB — POCT INFLUENZA A/B
Influenza A, POC: NEGATIVE
Influenza B, POC: NEGATIVE

## 2023-08-03 LAB — POC COVID19 BINAXNOW: SARS Coronavirus 2 Ag: NEGATIVE

## 2023-08-03 MED ORDER — BENZONATATE 200 MG PO CAPS: 200.0000 mg | ORAL_CAPSULE | Freq: Three times a day (TID) | ORAL | 0 refills | Status: DC | PRN

## 2023-08-03 NOTE — Assessment & Plan Note (Addendum)
Poc flu and covid negative.   Suspect Viral URI. Discussed the cycle of viral URI.  Exam stable today.   Rx sent for tessalon.  Recommendations given for OTC medications for symptom management. Discussed rest, hydration and humidifier.  Discussed ER precautions.   Follow up with PCP if symptoms worsen or do not improve.

## 2023-08-03 NOTE — Patient Instructions (Signed)
Covid and flu test are both negative.  Exam was stable.   Recommend rest, hydration and humidifier.  Start Benzonatate capsules for cough. Take 1 capsule by mouth three times daily as needed for cough. Prescription sent.   You can try a few things over the counter to help with your symptoms including:  Cough: Delsym or Robitussin (get the off brand, works just as well) Chest Congestion: Mucinex (plain) Nasal Congestion/Ear Pressure/Sinus Pressure: Try using Flonase (fluticasone) nasal spray. Instill 1 spray in each nostril twice daily. This can be purchased over the counter. Body aches, fevers, headache: Ibuprofen (not to exceed 2400 mg in 24 hours) or Acetaminophen-Tylenol (not to exceed 3000 mg in 24 hours) Runny Nose/Throat Drainage/Sneezing/Itchy or Watery Eyes: An antihistamine such as Zyrtec, Claritin, Xyzal, Allegra  You should be feeling better by day 7-8 of symptoms, but please do contact me if this is not the case.  Schedule in office appointment with PCP if symptoms worsen or do not improve.   Please go to the ER if you develop the symptoms we discussed.   It was a pleasure meeting you!

## 2023-08-03 NOTE — Progress Notes (Addendum)
Established Patient Office Visit  Subjective   Patient ID: Joanna Hood, female    DOB: 1946/03/24  Age: 78 y.o. MRN: [id number]  Chief Complaint  Patient presents with   Cough    And nausea/chills since saturday. Great grand kids have flu. Patient has not been taking anything for sx.     Cough Associated symptoms include chills. Pertinent negatives include no chest pain, ear pain, fever, headaches or shortness of breath.    Joanna Hood is a 78 year old female, patient of Dr. Sharen Hones, with past medical history of A fib, pulmonary HTN, CHF, presents today for an acute visit.  Cough: symptoms onset Saturday 07/31/23. Cough, nausea, chills. She has a productive cough with yellow color mucus. She has some nausea and fatigue with some nasal congestion. Great grand kids have the flu. She has not tried any medications over the counter.   Flu shot and pneumonia shot up to date.  No covid booster this season. Former smoker quit many years ago.    Patient Active Problem List   Diagnosis Date Noted   Long term (current) use of anticoagulants 11/11/2020   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   Encounter for preoperative dental examination    Poor dentition    Dental caries    Retained dental root    Chronic periodontitis    Pulmonary hypertension (HCC)    Chronic heart failure with preserved ejection fraction (HFpEF) (HCC)    Dyspnea 10/21/2020   Unintentional weight loss 09/17/2020   Renal insufficiency 09/17/2020   Acute cough 06/28/2020   Grade III diastolic dysfunction 06/28/2020   Mitral valve regurgitation    Rheumatoid arthritis (HCC) 12/16/2015   Health maintenance examination 04/23/2015   Advanced care planning/counseling discussion 04/23/2015   Medicare annual wellness visit, subsequent 11/03/2013   Vitamin D deficiency 11/03/2013   Anemia of chronic disease 09/02/2013   Persistent atrial fibrillation (HCC) 04/29/2013   White coat  syndrome with hypertension 04/29/2013   Osteopenia 01/01/2010   SNORING 02/08/2008   GOITER, MULTINODULAR 02/16/2007   Hypothyroidism 02/16/2007   History of colonic polyps 02/19/2005   History of lung abscess 05/23/1999   Past Medical History:  Diagnosis Date   Anemia    Atrial fibrillation (HCC)    Atrial fibrillation with RVR (HCC) 2014   persistent   Cataract    Chronic diastolic CHF (congestive heart failure) (HCC)    HTN   Chronic diastolic congestive heart failure (HCC)    COVID-19 virus infection 01/20/2019   ESOPHAGEAL STRICTURE 02/19/2005   Qualifier: Diagnosis of  By: Delrae Alfred MD, [employer]    Heart murmur    History of anemia    unclear cause, now resolved   History of chicken pox    History of colon polyps    benign   HLD (hyperlipidemia)    Hypothyroidism    Malignant hypertension longstanding   Microcytosis    Mitral valve regurgitation    Osteopenia 12/2013   T -2.3 hip   Osteoporosis    Pulmonary hypertension (HCC)    Rheumatoid arthritis (HCC) 11/2015   +RF, +CCP, ESR 49, synovitis on exam and [place] 11/2015 Nickola Major)   Right lower lobe pneumonia 07/24/2014   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   29 mm Medtronic Mosaic stented porcine bioprosthetic tissue valve   Tricuspid regurgitation    Vitamin D deficiency    Allergies  Allergen Reactions   Metronidazole Itching  and Rash         08/03/2023   10:43 AM 04/09/2023   10:59 AM 09/30/2022   10:09 AM  Depression screen PHQ 2/9  Decreased Interest 0 0 0  Down, Depressed, Hopeless 0 0 0  PHQ - 2 Score 0 0 0  Altered sleeping 0    Tired, decreased energy 0    Change in appetite 0    Feeling bad or failure about yourself  0    Trouble concentrating 0    Moving slowly or fidgety/restless 0    Suicidal thoughts 0    PHQ-9 Score 0    Difficult doing work/chores Not difficult at all         08/03/2023   10:43 AM 04/09/2023   10:59 AM 06/28/2020    9:40 AM   GAD 7 : Generalized Anxiety Score  Nervous, Anxious, on Edge 0 0 0  Control/stop worrying 0 0 0  Worry too much - different things 0 0 0  Trouble relaxing 0 0 0  Restless 0 0 0  Easily annoyed or irritable 0 0 0  Afraid - awful might happen 0 0 0  Total GAD 7 Score 0 0 0  Anxiety Difficulty Not difficult at all  Not difficult at all      Review of Systems  Constitutional:  Positive for chills and malaise/fatigue. Negative for fever.  HENT:  Positive for congestion. Negative for ear pain.   Respiratory:  Positive for cough. Negative for shortness of breath.   Cardiovascular:  Negative for chest pain.  Gastrointestinal:  Positive for nausea. Negative for abdominal pain, constipation, diarrhea and vomiting.  Genitourinary:  Negative for dysuria, frequency and urgency.  Neurological:  Positive for dizziness. Negative for headaches.  Psychiatric/Behavioral:  Negative for depression. The patient is not nervous/anxious.       Objective:     BP 120/80 (BP Location: Left Arm, Patient Position: Sitting, Cuff Size: Normal)   Pulse 99   Temp 98 F (36.7 C) (Oral)   Ht 5\' 2"  (1.575 m)   Wt 117 lb (53.1 kg)   SpO2 96%   BMI 21.40 kg/m  BP Readings from Last 3 Encounters:  08/03/23 120/80  07/06/23 (!) 152/58  04/09/23 123/66   Wt Readings from Last 3 Encounters:  08/03/23 117 lb (53.1 kg)  07/06/23 128 lb 6.4 oz (58.2 kg)  04/09/23 128 lb 6 oz (58.2 kg)      Physical Exam Vitals and nursing note reviewed.  Constitutional:      Appearance: Normal appearance.  HENT:     Right Ear: Tympanic membrane, ear canal and external ear normal.     Left Ear: Tympanic membrane, ear canal and external ear normal.     Nose: Rhinorrhea present.     Mouth/Throat:     Mouth: Mucous membranes are moist.     Pharynx: No oropharyngeal exudate or posterior oropharyngeal erythema.  Eyes:     Conjunctiva/sclera: Conjunctivae normal.  Cardiovascular:     Rate and Rhythm: Normal rate and  regular rhythm.     Pulses: Normal pulses.     Heart sounds: Normal heart sounds.  Pulmonary:     Effort: Pulmonary effort is normal.     Breath sounds: Normal breath sounds.  Skin:    General: Skin is warm.  Neurological:     Mental Status: She is alert and oriented to person, place, and time.  Psychiatric:        Mood and  Affect: Mood normal.        Behavior: Behavior normal.        Thought Content: Thought content normal.        Judgment: Judgment normal.      Results for orders placed or performed in visit on 08/03/23  POCT Influenza A/B  Result Value Ref Range   Influenza A, POC Negative Negative   Influenza B, POC Negative Negative  POC COVID-19  Result Value Ref Range   SARS Coronavirus 2 Ag Negative Negative       The 10-year ASCVD risk score (Arnett DK, et al., 2019) is: 12.8%    Assessment & Plan:  Acute cough Assessment & Plan: Poc flu and covid negative.   Suspect Viral URI. Discussed the cycle of viral URI.  Exam stable today.   Rx sent for tessalon.  Recommendations given for OTC medications for symptom management. Discussed rest, hydration and humidifier.  Discussed ER precautions.   Follow up with PCP if symptoms worsen or do not improve.   Orders: -     POCT Influenza A/B -     POC COVID-19 BinaxNow -     Benzonatate; Take 1 capsule (200 mg total) by mouth 3 (three) times daily as needed.  Dispense: 60 capsule; Refill: 0     Return if symptoms worsen or fail to improve.    Modesto Charon, NP

## 2023-08-05 ENCOUNTER — Ambulatory Visit
Admission: RE | Admit: 2023-08-05 | Discharge: 2023-08-05 | Disposition: A | Discharge status: Home / Self Care | Payer: Medicare PPO | Source: Ambulatory Visit | Attending: Family Medicine | Admitting: Family Medicine

## 2023-08-05 DIAGNOSIS — Z1231 Encounter for screening mammogram for malignant neoplasm of breast: Secondary | ICD-10-CM | POA: Diagnosis not present

## 2023-08-06 ENCOUNTER — Other Ambulatory Visit (HOSPITAL_COMMUNITY): Payer: Self-pay

## 2023-08-06 ENCOUNTER — Telehealth (HOSPITAL_COMMUNITY): Payer: Self-pay | Admitting: Pharmacy Technician

## 2023-08-06 NOTE — Telephone Encounter (Signed)
Pharmacy Patient Advocate Encounter   Received notification from  [employer]  that prior authorization for BENZONATATE 200MG  CAPSULE is required/requested.   Insurance verification completed.   The patient is insured through [employer] .   Per test claim:   CALLED [facility] ON [address] IN [place] AND ADVISED THAT THE MEDICATION IS PART D EXCLUDED.

## 2023-08-15 ENCOUNTER — Other Ambulatory Visit (HOSPITAL_COMMUNITY): Payer: Self-pay | Admitting: Internal Medicine

## 2023-08-15 DIAGNOSIS — I10 Essential (primary) hypertension: Secondary | ICD-10-CM

## 2023-08-31 ENCOUNTER — Ambulatory Visit: Payer: Medicare PPO | Attending: Cardiovascular Disease | Admitting: *Deleted

## 2023-08-31 DIAGNOSIS — Z953 Presence of xenogenic heart valve: Secondary | ICD-10-CM | POA: Diagnosis not present

## 2023-08-31 DIAGNOSIS — I4819 Other persistent atrial fibrillation: Secondary | ICD-10-CM

## 2023-08-31 DIAGNOSIS — Z7901 Long term (current) use of anticoagulants: Secondary | ICD-10-CM

## 2023-08-31 LAB — POCT INR: INR: 2.3 (ref 2.0–3.0)

## 2023-08-31 NOTE — Patient Instructions (Addendum)
 Description   Continue taking warfarin 1 tablet daily except 1/2 tablet on Sundays. Recheck 6 weeks. Coumadin Clinic [phone] Fax Clearance Forms to [phone] or [phone]

## 2023-09-18 ENCOUNTER — Other Ambulatory Visit: Payer: Self-pay | Admitting: Family Medicine

## 2023-09-18 DIAGNOSIS — E039 Hypothyroidism, unspecified: Secondary | ICD-10-CM

## 2023-09-18 DIAGNOSIS — I10 Essential (primary) hypertension: Secondary | ICD-10-CM

## 2023-09-25 ENCOUNTER — Other Ambulatory Visit: Payer: Self-pay | Admitting: Family Medicine

## 2023-09-25 DIAGNOSIS — N289 Disorder of kidney and ureter, unspecified: Secondary | ICD-10-CM

## 2023-09-25 DIAGNOSIS — D638 Anemia in other chronic diseases classified elsewhere: Secondary | ICD-10-CM

## 2023-09-25 DIAGNOSIS — I10 Essential (primary) hypertension: Secondary | ICD-10-CM

## 2023-09-25 DIAGNOSIS — E559 Vitamin D deficiency, unspecified: Secondary | ICD-10-CM

## 2023-09-25 DIAGNOSIS — E039 Hypothyroidism, unspecified: Secondary | ICD-10-CM

## 2023-09-25 NOTE — Addendum Note (Signed)
 Addended by: Eustaquio Boyden on: 09/25/2023 11:21 AM   Modules accepted: Orders

## 2023-10-04 ENCOUNTER — Ambulatory Visit (INDEPENDENT_AMBULATORY_CARE_PROVIDER_SITE_OTHER): Payer: Medicare HMO

## 2023-10-04 ENCOUNTER — Other Ambulatory Visit (INDEPENDENT_AMBULATORY_CARE_PROVIDER_SITE_OTHER): Payer: Medicare HMO

## 2023-10-04 ENCOUNTER — Telehealth: Payer: Self-pay | Admitting: Cardiovascular Disease

## 2023-10-04 VITALS — BP 120/80 | Ht 62.0 in | Wt 128.0 lb

## 2023-10-04 DIAGNOSIS — I10 Essential (primary) hypertension: Secondary | ICD-10-CM

## 2023-10-04 DIAGNOSIS — E559 Vitamin D deficiency, unspecified: Secondary | ICD-10-CM | POA: Diagnosis not present

## 2023-10-04 DIAGNOSIS — E039 Hypothyroidism, unspecified: Secondary | ICD-10-CM

## 2023-10-04 DIAGNOSIS — N289 Disorder of kidney and ureter, unspecified: Secondary | ICD-10-CM

## 2023-10-04 DIAGNOSIS — Z2821 Immunization not carried out because of patient refusal: Secondary | ICD-10-CM

## 2023-10-04 DIAGNOSIS — D638 Anemia in other chronic diseases classified elsewhere: Secondary | ICD-10-CM

## 2023-10-04 DIAGNOSIS — Z0000 Encounter for general adult medical examination without abnormal findings: Secondary | ICD-10-CM | POA: Diagnosis not present

## 2023-10-04 DIAGNOSIS — E785 Hyperlipidemia, unspecified: Secondary | ICD-10-CM

## 2023-10-04 LAB — CBC WITH DIFFERENTIAL/PLATELET
Basophils Absolute: 0 10*3/uL (ref 0.0–0.1)
Basophils Relative: 1 % (ref 0.0–3.0)
Eosinophils Absolute: 0.1 10*3/uL (ref 0.0–0.7)
Eosinophils Relative: 2 % (ref 0.0–5.0)
HCT: 33.7 % — ABNORMAL LOW (ref 36.0–46.0)
Hemoglobin: 10.8 g/dL — ABNORMAL LOW (ref 12.0–15.0)
Lymphocytes Relative: 21 % (ref 12.0–46.0)
Lymphs Abs: 1 10*3/uL (ref 0.7–4.0)
MCHC: 32 g/dL (ref 30.0–36.0)
MCV: 82.2 fl (ref 78.0–100.0)
Monocytes Absolute: 0.5 10*3/uL (ref 0.1–1.0)
Monocytes Relative: 9.7 % (ref 3.0–12.0)
Neutro Abs: 3.1 10*3/uL (ref 1.4–7.7)
Neutrophils Relative %: 66.3 % (ref 43.0–77.0)
Platelets: 165 10*3/uL (ref 150.0–400.0)
RBC: 4.1 Mil/uL (ref 3.87–5.11)
RDW: 15.3 % (ref 11.5–15.5)
WBC: 4.7 10*3/uL (ref 4.0–10.5)

## 2023-10-04 LAB — LIPID PANEL
Cholesterol: 128 mg/dL (ref 0–200)
HDL: 58.6 mg/dL (ref >39.00)
LDL Cholesterol: 60 mg/dL (ref 0–99)
NonHDL: 69.29
Total CHOL/HDL Ratio: 2
Triglycerides: 48 mg/dL (ref 0.0–149.0)
VLDL: 9.6 mg/dL (ref 0.0–40.0)

## 2023-10-04 LAB — TSH: TSH: 1.44 u[IU]/mL (ref 0.35–5.50)

## 2023-10-04 LAB — COMPREHENSIVE METABOLIC PANEL WITH GFR
ALT: 11 U/L (ref 0–35)
AST: 18 U/L (ref 0–37)
Albumin: 4.3 g/dL (ref 3.5–5.2)
Alkaline Phosphatase: 94 U/L (ref 39–117)
BUN: 22 mg/dL (ref 6–23)
CO2: 28 meq/L (ref 19–32)
Calcium: 9.6 mg/dL (ref 8.4–10.5)
Chloride: 104 meq/L (ref 96–112)
Creatinine, Ser: 0.94 mg/dL (ref 0.40–1.20)
GFR: 59.01 mL/min — ABNORMAL LOW (ref >60.00)
Glucose, Bld: 92 mg/dL (ref 70–99)
Potassium: 4.5 meq/L (ref 3.5–5.1)
Sodium: 139 meq/L (ref 135–145)
Total Bilirubin: 0.8 mg/dL (ref 0.2–1.2)
Total Protein: 7.2 g/dL (ref 6.0–8.3)

## 2023-10-04 LAB — MICROALBUMIN / CREATININE URINE RATIO
Creatinine,U: 42.3 mg/dL
Microalb Creat Ratio: 38 mg/g — ABNORMAL HIGH (ref 0.0–30.0)
Microalb, Ur: 1.6 mg/dL (ref 0.0–1.9)

## 2023-10-04 LAB — VITAMIN D 25 HYDROXY (VIT D DEFICIENCY, FRACTURES): VITD: 47.05 ng/mL (ref 30.00–100.00)

## 2023-10-04 MED ORDER — ATORVASTATIN CALCIUM 20 MG PO TABS: 20.0000 mg | ORAL_TABLET | Freq: Every day | ORAL | 2 refills | Status: DC

## 2023-10-04 NOTE — Telephone Encounter (Signed)
*  STAT* If patient is at the pharmacy, call can be transferred to refill team.   1. Which medications need to be refilled? (please list name of each medication and dose if known)   atorvastatin (LIPITOR) 20 MG tablet    2. Which pharmacy/location (including street and city if local pharmacy) is medication to be sent to?  [facility] - [place], [place] - [address]     3. Do they need a 30 day or 90 day supply? 90 day

## 2023-10-04 NOTE — Progress Notes (Signed)
 Because this visit was a virtual/telehealth visit,  certain criteria was not obtained, such a blood pressure, CBG if applicable, and timed get up and go. Any medications not marked as "taking" were not mentioned during the medication reconciliation part of the visit. Any vitals not documented were not able to be obtained due to this being a telehealth visit or patient was unable to self-report a recent blood pressure reading due to a lack of equipment at home via telehealth. Vitals that have been documented are verbally provided by the patient.  Subjective:   Joanna Hood is a 78 y.o. who presents for a Medicare Wellness preventive visit.  Visit Complete: Virtual I connected with  Joanna Hood on 10/04/23 by a audio enabled telemedicine application and verified that I am speaking with the correct person using two identifiers.  Patient Location: Home  Provider Location: Home Office  I discussed the limitations of evaluation and management by telemedicine. The patient expressed understanding and agreed to proceed.  Vital Signs: Because this visit was a virtual/telehealth visit, some criteria may be missing or patient reported. Any vitals not documented were not able to be obtained and vitals that have been documented are patient reported.  VideoDeclined- This patient declined Librarian, academic. Therefore the visit was completed with audio only.  Persons Participating in Visit: Patient.  AWV Questionnaire: No: Patient Medicare AWV questionnaire was not completed prior to this visit.        Objective:    Today's Vitals   10/04/23 1104 10/04/23 1105 10/04/23 1118  BP: 120/80    Weight: 128 lb (58.1 kg)    Height: 5\' 2"  (1.575 m)    PainSc:  5  5    Body mass index is 23.41 kg/m.     10/04/2023   11:03 AM 09/30/2022   10:09 AM 09/05/2021    2:06 PM 10/24/2020    8:14 AM 10/21/2020    7:22 AM 09/04/2020    2:50 PM 06/02/2019   12:01 PM   Advanced Directives  Does Patient Have a Medical Advance Directive? Yes No No No No No No  Type of Estate agent of [place];Living will        Does patient want to make changes to medical advance directive? No - Patient declined  Yes (MAU/Ambulatory/Procedural Areas - Information given)   No - Patient declined   Copy of Healthcare Power of Attorney in Chart? No - copy requested        Would patient like information on creating a medical advance directive?  No - Patient declined  No - Patient declined   No - Patient declined    Current Medications (verified) Outpatient Encounter Medications as of 10/04/2023  Medication Sig   acetaminophen (TYLENOL) 325 MG tablet Take 650 mg by mouth every 6 (six) hours as needed (pain).   amLODipine (NORVASC) 10 MG tablet Take 1 tablet (10 mg total) by mouth daily.   aspirin EC 81 MG tablet Take 81 mg by mouth daily. Swallow whole.   atorvastatin (LIPITOR) 20 MG tablet TAKE 1 TABLET(20 MG) BY MOUTH DAILY   benzonatate (TESSALON) 200 MG capsule Take 1 capsule (200 mg total) by mouth 3 (three) times daily as needed.   Calcium Carb-Cholecalciferol (CALCIUM-VITAMIN D) 600-400 MG-UNIT TABS Take 1 tablet by mouth daily.   cholecalciferol (VITAMIN D) 1000 UNITS tablet Take 1,000 Units by mouth daily.   folic acid (FOLVITE) 1 MG tablet Take 1 tablet (1 mg total)  by mouth daily.   furosemide (LASIX) 20 MG tablet TAKE 1 TABLET EVERY DAY   levothyroxine (SYNTHROID) 100 MCG tablet TAKE 1 TABLET EVERY DAY BEFORE BREAKFAST   methotrexate (RHEUMATREX) 2.5 MG tablet Take 15 mg by mouth every Friday. Caution:Chemotherapy. Protect from light.   omeprazole (PRILOSEC) 20 MG capsule TAKE 1 CAPSULE EVERY DAY (Patient taking differently: Take 20 mg by mouth every other day.)   sacubitril-valsartan (ENTRESTO) 97-103 MG Take 1 tablet by mouth 2 (two) times daily. NEEDS FOLLOW UP APPOINTMENT FOR MORE REFILLS   spironolactone (ALDACTONE) 25 MG tablet TAKE 1 TABLET  EVERY DAY   warfarin (COUMADIN) 4 MG tablet TAKE 1 TABLET EVERY DAY OR AS DIRECTED BY THE COUMADIN CLINIC   amoxicillin (AMOXIL) 500 MG tablet Take 4 tablets (2,000 mg total) by mouth as needed (1 hour prior to dental work). (Patient not taking: Reported on 10/04/2023)   No facility-administered encounter medications on file as of 10/04/2023.    Allergies (verified) Metronidazole   History: Past Medical History:  Diagnosis Date   Anemia    Atrial fibrillation (HCC)    Atrial fibrillation with RVR (HCC) 2014   persistent   Cataract    Chronic diastolic CHF (congestive heart failure) (HCC)    HTN   Chronic diastolic congestive heart failure (HCC)    COVID-19 virus infection 01/20/2019   ESOPHAGEAL STRICTURE 02/19/2005   Qualifier: Diagnosis of  By: Delrae Alfred MD, [employer]    Heart murmur    History of anemia    unclear cause, now resolved   History of chicken pox    History of colon polyps    benign   HLD (hyperlipidemia)    Hypothyroidism    Malignant hypertension longstanding   Microcytosis    Mitral valve regurgitation    Osteopenia 12/2013   T -2.3 hip   Osteoporosis    Pulmonary hypertension (HCC)    Rheumatoid arthritis (HCC) 11/2015   +RF, +CCP, ESR 49, synovitis on exam and [place] 11/2015 Nickola Major)   Right lower lobe pneumonia 07/24/2014   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   29 mm Medtronic Mosaic stented porcine bioprosthetic tissue valve   Tricuspid regurgitation    Vitamin D deficiency    Past Surgical History:  Procedure Laterality Date   CARDIAC CATHETERIZATION  2014   normal per patient   CLIPPING OF ATRIAL APPENDAGE N/A 10/28/2020   Procedure: CLIPPING OF ATRIAL APPENDAGE USING ATRICURE  CLIP SIZE ;  Surgeon: Purcell Nails, MD;  Location: [facility] OR;  Service: Open Heart Surgery;  Laterality: N/A;   COLONOSCOPY  11/2015   TA, int hem, rpt 5 yrs Marina Goodell)   COLONOSCOPY  05/2021   WNL no repeat recommended Marina Goodell)    LEFT HEART CATHETERIZATION WITH CORONARY ANGIOGRAM N/A 05/01/2013   Procedure: LEFT HEART CATHETERIZATION WITH CORONARY ANGIOGRAM;  Surgeon: Marykay Lex, MD;  Location: [facility] CATH LAB;  Service: Cardiovascular;  Laterality: N/A;   MITRAL VALVE REPAIR N/A 10/28/2020   Procedure: MITRAL VALVE REPLACEMENT (MVR) USING MEDTRONIC MOSAIC VALVE SIZE ;  Surgeon: Purcell Nails, MD;  Location: [facility] OR;  Service: Open Heart Surgery;  Laterality: N/A;   MULTIPLE EXTRACTIONS WITH ALVEOLOPLASTY N/A 10/24/2020   Procedure: MULTIPLE EXTRACTION WITH ALVEOLOPLASTY;  Surgeon: Sharman Cheek, DMD;  Location: [facility] OR;  Service: Dentistry;  Laterality: N/A;   MULTIPLE TOOTH EXTRACTIONS  10/2020   Extractions of teeth numbers 3, 4, 7, 8, 9, 10, 11 and  14 - prior to valvular surgery   PARTIAL HYSTERECTOMY  1970   fibroids   RIGHT HEART CATH N/A 10/25/2020   Procedure: RIGHT HEART CATH;  Surgeon: Dolores Patty, MD;  Location: [facility] INVASIVE CV LAB;  Service: Cardiovascular;  Laterality: N/A;   RIGHT/LEFT HEART CATH AND CORONARY ANGIOGRAPHY N/A 10/21/2020   Procedure: RIGHT/LEFT HEART CATH AND CORONARY ANGIOGRAPHY;  Surgeon: Runell Gess, MD;  Location: [facility] INVASIVE CV LAB;  Service: Cardiovascular;  Laterality: N/A;   TEE WITHOUT CARDIOVERSION N/A 10/21/2020   Procedure: TRANSESOPHAGEAL ECHOCARDIOGRAM (TEE);  Surgeon: Lewayne Bunting, MD;  Location: [facility] ENDOSCOPY;  Service: Cardiovascular;  Laterality: N/A;   TEE WITHOUT CARDIOVERSION N/A 10/28/2020   Procedure: TRANSESOPHAGEAL ECHOCARDIOGRAM (TEE);  Surgeon: Purcell Nails, MD;  Location: [facility] OR;  Service: Open Heart Surgery;  Laterality: N/A;   Family History  Problem Relation Age of Onset   Cancer Mother        ovarian   Sudden death Mother    Diabetes Sister    Hypertension Sister    Cancer Maternal Grandmother        lung   Heart disease Maternal Grandmother    Hypertension Cousin    CAD Neg Hx    Stroke Neg Hx    Colon cancer Neg Hx     Esophageal cancer Neg Hx    Rectal cancer Neg Hx    Colon polyps Neg Hx    Stomach cancer Neg Hx    Social History   Socioeconomic History   Marital status: Single    Spouse name: Not on file   Number of children: 1   Years of education: Not on file   Highest education level: Not on file  Occupational History   Occupation: Retired  Tobacco Use   Smoking status: Former    Current packs/day: 0.00    Types: Cigarettes    Quit date: 05/16/1969    Years since quitting: 54.4   Smokeless tobacco: Former  Building services engineer status: Never Used  Substance and Sexual Activity   Alcohol use: No    Alcohol/week: 0.0 standard drinks of alcohol   Drug use: No   Sexual activity: Not Currently  Other Topics Concern   Not on file  Social History Narrative   Lives with grand-daughter and godson   Occupation: retired, 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   Edu: HS   Social Drivers of Corporate investment banker Strain: Low Risk  (10/04/2023)   Overall Financial Resource Strain (CARDIA)    Difficulty of Paying Living Expenses: Not hard at all  Food Insecurity: No Food Insecurity (10/04/2023)   Hunger Vital Sign    Worried About Running Out of Food in the Last Year: Never true    Ran Out of Food in the Last Year: Never true  Transportation Needs: No Transportation Needs (10/04/2023)   PRAPARE - Administrator, Civil Service (Medical): No    Lack of Transportation (Non-Medical): No  Physical Activity: Sufficiently Active (10/04/2023)   Exercise Vital Sign    Days of Exercise per Week: 3 days    Minutes of Exercise per Session: 50 min  Stress: No Stress Concern Present (10/04/2023)   [employer] of Occupational Health - Occupational Stress Questionnaire    Feeling of Stress : Not at all  Social Connections: Moderately Isolated (10/04/2023)   Social Connection and Isolation Panel [NHANES]    Frequency of Communication with Friends  and Family:  More than three times a week    Frequency of Social Gatherings with Friends and Family: More than three times a week    Attends Religious Services: More than 4 times per year    Active Member of [employer] or Organizations: No    Attends Engineer, structural: Never    Marital Status: Never married    Tobacco Counseling Counseling given: Not Answered    Clinical Intake:  Pre-visit preparation completed: Yes  Pain : 0-10 Pain Score: 5  Pain Type: Acute pain Pain Location: Knee Pain Orientation: Left Pain Descriptors / Indicators: Aching Pain Onset: Today Pain Frequency: Occasional Pain Relieving Factors: pattient states she try stay off her knee  Pain Relieving Factors: pattient states she try stay off her knee  BMI - recorded: 23.41 Nutritional Status: BMI of 19-24  Normal Nutritional Risks: None Diabetes: No  Lab Results  Component Value Date   HGBA1C 5.0 10/28/2020   HGBA1C 5.5 11/30/2006     How often do you need to have someone help you when you read instructions, pamphlets, or other written materials from your doctor or pharmacy?: 1 - Never What is the last grade level you completed in school?: 1 year college  Interpreter Needed?: No  Information entered by :: Juliann Ochoa   Activities of Daily Living Independent Ambulation Independent Medication Administration Independent   Patient Care Team: Claire Crick, MD as PCP - General (Family Medicine) Avanell Leigh, MD as Consulting Physician (Cardiology) Stefan Edge, MD as Consulting Physician (Rheumatology) Horace Lye, OD as Consulting Physician (Optometry) [place], Delaney Fearing, [facility] (Inactive) as Pharmacist (Pharmacist)  Indicate any recent Medical Services you may have received from other than [facility] providers in the past year (date may be approximate).     Assessment:   This is a routine wellness examination for Joanna Hood.  Hearing/Vision screen Hearing Screening - Comments::  No problems hearing  Vision Screening - Comments:: Patient wears glasses. Appt in July    Goals Addressed             This Visit's Progress    Patient Stated       She would like to do a little more exercise       Depression Screen      10/04/2023   11:10 AM 08/03/2023   10:43 AM 04/09/2023   10:59 AM 09/30/2022   10:09 AM 09/05/2021    2:08 PM 03/28/2021    2:29 PM 01/28/2021   12:36 PM  PHQ 2/9 Scores  PHQ - 2 Score 0 0 0 0 0 0 0  PHQ- 9 Score 0 0         Fall Risk     10/04/2023   11:09 AM 08/03/2023   10:42 AM 04/09/2023   10:59 AM 09/30/2022   10:10 AM 09/05/2021    2:07 PM  Fall Risk   Falls in the past year? 0 0 0 0 0  Number falls in past yr: 0 0  0 0  Injury with Fall? 0 0  0 0  Risk for fall due to : No Fall Risks No Fall Risks  No Fall Risks No Fall Risks  Follow up Falls prevention discussed;Falls evaluation completed Falls evaluation completed  Falls prevention discussed;Falls evaluation completed Falls prevention discussed    MEDICARE RISK AT HOME:  Medicare Risk at Home Any stairs in or around the home?: No If so, are there any without handrails?: No Home free of loose  throw rugs in walkways, pet beds, electrical cords, etc?: Yes Adequate lighting in your home to reduce risk of falls?: Yes Life alert?: No Use of a cane, walker or w/c?: No Grab bars in the bathroom?: Yes Shower chair or bench in shower?: Yes Elevated toilet seat or a handicapped toilet?: Yes  TIMED UP AND GO:  Was the test performed?  No  Cognitive Function: 6CIT completed    09/04/2020    2:54 PM 06/02/2019   12:04 PM 05/26/2018   10:16 AM 04/23/2016    9:26 AM  MMSE - Mini Mental State Exam  Orientation to time 5 5 5 5   Orientation to Place 5 5 5 5   Registration 3 3 3 3   Attention/ Calculation 5 5 0 0  Recall 3 3 3 3   Language- name 2 objects   0 0  Language- repeat 1 1 1 1   Language- follow 3 step command   3 3  Language- read & follow direction   0 0  Write a sentence    0 0  Copy design   0 0  Total score   20 20        10/04/2023   11:07 AM 09/30/2022   10:11 AM  6CIT Screen  What Year? 0 points 0 points  What month? 0 points 0 points  What time? 0 points 0 points  Count back from 20 0 points 0 points  Months in reverse 0 points 0 points  Repeat phrase 0 points 2 points  Total Score 0 points 2 points    Immunizations Immunization History  Administered Date(s) Administered   Fluad Quad(high Dose 65+) 03/24/2021, 03/27/2022   Fluad Trivalent(High Dose 65+) 04/09/2023   Influenza Whole 07/05/2009, 05/14/2010   Influenza, High Dose Seasonal PF 04/03/2014, 03/17/2017, 04/05/2018, 04/04/2019, 03/26/2020   Influenza,inj,Quad PF,6+ Mos 04/30/2013, 04/23/2016   Influenza-Unspecified 04/12/2015   PFIZER(Purple Top)SARS-COV-2 Vaccination 08/18/2019, 09/12/2019, 04/01/2020   Pfizer Covid-19 Vaccine Bivalent Booster 65yrs & up 10/23/2021   Pneumococcal Conjugate-13 04/23/2015   Pneumococcal Polysaccharide-23 04/30/2013   Td 06/23/2003   Tdap 04/23/2016, 11/01/2017   Zoster Recombinant(Shingrix) 10/23/2021, 01/09/2022    Screening Tests Health Maintenance  Topic Date Due   COVID-19 Vaccine (5 - 2024-25 season) 02/21/2023   INFLUENZA VACCINE  01/21/2024   MAMMOGRAM  08/04/2024   Medicare Annual Wellness (AWV)  10/03/2024   DEXA SCAN  02/25/2027   DTaP/Tdap/Td (4 - Td or Tdap) 11/02/2027   Pneumonia Vaccine 67+ Years old  Completed   Hepatitis C Screening  Completed   Zoster Vaccines- Shingrix  Completed   HPV VACCINES  Aged Out   Meningococcal B Vaccine  Aged Out   Colonoscopy  Discontinued    Health Maintenance  Health Maintenance Due  Topic Date Due   COVID-19 Vaccine (5 - 2024-25 season) 02/21/2023   Health Maintenance Items Addressed:patient declined vaccine   Additional Screening:  Vision Screening: Recommended annual ophthalmology exams for early detection of glaucoma and other disorders of the eye.  Dental Screening:  Recommended annual dental exams for proper oral hygiene  Community Resource Referral / Chronic Care Management: CRR required this visit?  No   CCM required this visit?  No     Plan:     I have personally reviewed and noted the following in the patient's chart:   Medical and social history Use of alcohol, tobacco or illicit drugs  Current medications and supplements including opioid prescriptions. Patient is not currently taking opioid prescriptions. Functional ability  and status Nutritional status Physical activity Advanced directives List of other physicians Hospitalizations, surgeries, and ER visits in previous 12 months Vitals Screenings to include cognitive, depression, and falls Referrals and appointments  In addition, I have reviewed and discussed with patient certain preventive protocols, quality metrics, and best practice recommendations. A written personalized care plan for preventive services as well as general preventive health recommendations were provided to patient.     Freeda Jerry, [place]   10/04/2023   After Visit Summary: (MyChart) Due to this being a telephonic visit, the after visit summary with patients personalized plan was offered to patient via MyChart   Notes: Nothing significant to report at this time.

## 2023-10-04 NOTE — Patient Instructions (Signed)
 Ms. Gadbois , Thank you for taking time to come for your Medicare Wellness Visit. I appreciate your ongoing commitment to your health goals. Please review the following plan we discussed and let me know if I can assist you in the future.   Referrals/Orders/Follow-Ups/Clinician Recommendations: Follow up in 1 year for next AWV  This is a list of the screening recommended for you and due dates:  Health Maintenance  Topic Date Due   COVID-19 Vaccine (5 - 2024-25 season) 02/21/2023   Flu Shot  01/21/2024   Mammogram  08/04/2024   Medicare Annual Wellness Visit  10/03/2024   DEXA scan (bone density measurement)  02/25/2027   DTaP/Tdap/Td vaccine (4 - Td or Tdap) 11/02/2027   Pneumonia Vaccine  Completed   Hepatitis C Screening  Completed   Zoster (Shingles) Vaccine  Completed   HPV Vaccine  Aged Out   Meningitis B Vaccine  Aged Out   Colon Cancer Screening  Discontinued    Advanced directives: (Declined) Advance directive discussed with you today. Even though you declined this today, please call our office should you change your mind, and we can give you the proper paperwork for you to fill out.  Next Medicare Annual Wellness Visit scheduled for next year: Yes

## 2023-10-11 ENCOUNTER — Encounter: Payer: Self-pay | Admitting: Family Medicine

## 2023-10-11 ENCOUNTER — Ambulatory Visit (INDEPENDENT_AMBULATORY_CARE_PROVIDER_SITE_OTHER): Payer: Medicare HMO | Admitting: Family Medicine

## 2023-10-11 VITALS — BP 126/68 | HR 51 | Temp 98.4°F | Ht 63.0 in | Wt 127.1 lb

## 2023-10-11 DIAGNOSIS — E039 Hypothyroidism, unspecified: Secondary | ICD-10-CM | POA: Diagnosis not present

## 2023-10-11 DIAGNOSIS — I5032 Chronic diastolic (congestive) heart failure: Secondary | ICD-10-CM

## 2023-10-11 DIAGNOSIS — N1831 Chronic kidney disease, stage 3a: Secondary | ICD-10-CM

## 2023-10-11 DIAGNOSIS — M069 Rheumatoid arthritis, unspecified: Secondary | ICD-10-CM | POA: Diagnosis not present

## 2023-10-11 DIAGNOSIS — I10 Essential (primary) hypertension: Secondary | ICD-10-CM

## 2023-10-11 DIAGNOSIS — Z0000 Encounter for general adult medical examination without abnormal findings: Secondary | ICD-10-CM | POA: Diagnosis not present

## 2023-10-11 DIAGNOSIS — Z7189 Other specified counseling: Secondary | ICD-10-CM

## 2023-10-11 DIAGNOSIS — D638 Anemia in other chronic diseases classified elsewhere: Secondary | ICD-10-CM | POA: Diagnosis not present

## 2023-10-11 DIAGNOSIS — I4819 Other persistent atrial fibrillation: Secondary | ICD-10-CM | POA: Diagnosis not present

## 2023-10-11 DIAGNOSIS — E042 Nontoxic multinodular goiter: Secondary | ICD-10-CM

## 2023-10-11 DIAGNOSIS — M8589 Other specified disorders of bone density and structure, multiple sites: Secondary | ICD-10-CM

## 2023-10-11 DIAGNOSIS — K08109 Complete loss of teeth, unspecified cause, unspecified class: Secondary | ICD-10-CM | POA: Insufficient documentation

## 2023-10-11 DIAGNOSIS — E559 Vitamin D deficiency, unspecified: Secondary | ICD-10-CM

## 2023-10-11 DIAGNOSIS — Z7901 Long term (current) use of anticoagulants: Secondary | ICD-10-CM

## 2023-10-11 DIAGNOSIS — Z953 Presence of xenogenic heart valve: Secondary | ICD-10-CM

## 2023-10-11 MED ORDER — LEVOTHYROXINE SODIUM 100 MCG PO TABS: 100.0000 ug | ORAL_TABLET | Freq: Every day | ORAL | 4 refills | Status: AC

## 2023-10-11 MED ORDER — SPIRONOLACTONE 25 MG PO TABS: 25.0000 mg | ORAL_TABLET | Freq: Every day | ORAL | 3 refills | Status: AC

## 2023-10-11 MED ORDER — FOLIC ACID 1 MG PO TABS: 1.0000 mg | ORAL_TABLET | Freq: Every day | ORAL | 4 refills | Status: AC

## 2023-10-11 MED ORDER — OMEPRAZOLE 20 MG PO CPDR: 20.0000 mg | DELAYED_RELEASE_CAPSULE | ORAL | 3 refills | Status: AC

## 2023-10-11 NOTE — Assessment & Plan Note (Signed)
Continues coumadin. 

## 2023-10-11 NOTE — Assessment & Plan Note (Signed)
 Continues coumadin  through cardiology clinic.

## 2023-10-11 NOTE — Assessment & Plan Note (Signed)
 Actually improved kidney function on latest check.  Discussed proteinuria.

## 2023-10-11 NOTE — Assessment & Plan Note (Signed)
Continue daily replacement 

## 2023-10-11 NOTE — Assessment & Plan Note (Signed)
 Preventative protocols reviewed and updated unless pt declined. Discussed healthy diet and lifestyle.

## 2023-10-11 NOTE — Assessment & Plan Note (Signed)
 Chronic, stable on current regimen - continue.

## 2023-10-11 NOTE — Assessment & Plan Note (Signed)
Chronic, great control on current regimen - continue this.  ?

## 2023-10-11 NOTE — Assessment & Plan Note (Signed)
 Late 2024

## 2023-10-11 NOTE — Assessment & Plan Note (Addendum)
 Stable period s/p valve replacement Needs SBE ppx

## 2023-10-11 NOTE — Assessment & Plan Note (Signed)
 Chronic, stable period off oral iron  replacement. Saw hematology 09/2021.

## 2023-10-11 NOTE — Assessment & Plan Note (Signed)
 Appreciate rheum care [facility])

## 2023-10-11 NOTE — Assessment & Plan Note (Addendum)
 Advanced directives: HCPOA would be daughter - has spoken with her. Packet previously provided. Encouraged she complete this.

## 2023-10-11 NOTE — Assessment & Plan Note (Signed)
TSH remains normal.  

## 2023-10-11 NOTE — Assessment & Plan Note (Signed)
 Chronic, stable period on entresto , lasix , spironolactone .

## 2023-10-11 NOTE — Patient Instructions (Addendum)
 Good to see you today  Finish filling advanced directive and bring us  a copy when you complete it Return as needed or in 1 year for next physical

## 2023-10-11 NOTE — Progress Notes (Signed)
 Ph: [phone] Fax: [phone]   Patient ID: Joanna Hood, female    DOB: 11-Jul-1945, 78 y.o.   MRN: [id number]  This visit was conducted in person.  BP 126/68   Pulse (!) 51   Temp 98.4 F (36.9 C) (Oral)   Ht 5\' 3"  (1.6 m)   Wt 127 lb 2 oz (57.7 kg)   SpO2 97%   BMI 22.52 kg/m    CC: CPE Subjective:   HPI: Joanna Hood is a 78 y.o. female presenting on 10/11/2023 for Annual Exam (MCR prt 2 [AWV- 10/04/23].)   Saw health advisor last week for medicare wellness visit. Note reviewed.   No results found.  Flowsheet Row Office Visit from 10/11/2023 in [facility] [place]  PHQ-2 Total Score 0          10/11/2023    9:11 AM 10/04/2023   11:09 AM 08/03/2023   10:42 AM 04/09/2023   10:59 AM 09/30/2022   10:10 AM  Fall Risk   Falls in the past year? 0 0 0 0 0  Number falls in past yr:  0 0  0  Injury with Fall?  0 0  0  Risk for fall due to :  No Fall Risks No Fall Risks  No Fall Risks  Follow up  Falls prevention discussed;Falls evaluation completed Falls evaluation completed  Falls prevention discussed;Falls evaluation completed    S/p bioprosthetic MVR for severe MR with clipped atrial appendage 2022 [facility]), h/o G3DD with severe LA enlargement, persistent afib now on coumadin .  Needs SBE ppx.   White coat HTN managed with amlodipine  10mg  daily, lasix  20mg  daily, entresto  90/103 BID, spironolactone  25mg  daily. Overall accurate readings when compared at cardiology office. Sees cardiology yearly Katheryne Pane).   Persistent microcytic anemia unresponsive to oral iron  replacement - saw hematology, thought anemia of chronic disease. Oral iron  replacement stopped.   RA - on MTX 15mg  weekly sees rheum Q6 mo ([place])  Notes increased gassiness.   Discussed recently discovered [facility] laboratory miscalculation - the UACR calculation in the software of the system was incorrect but the absolute levels of microalbumin and creatinine  were correct.   Preventative:   COLONOSCOPY 11/2015 - TA, int hem, rpt 5 yrs Elvin Hammer) - will be due this summer.  COLONOSCOPY 05/2021 - WNL no repeat recommended Elvin Hammer)  Well woman exam - partial hysterectomy 1970, ovaries remain. All normal pap smears. No pelvic pain/pressure, vaginal bleeding [facility]  DEXA 12/2013 - T-2.3 osteopenia  DEXA 08/2019 - T -2.0 R hip, 0.7 spine  DEXA 02/2022 T -2.4 L forearm, -2.3 RFN, not at increased risk of fracture - reviewed calcium /vitamin D  daily and regular walking/weight bearing exercise.  Lung cancer screen - not eligible  Flu shot yearly  COVID vaccine Pfizer 07/2019, 08/2019, booster 03/2020  Pneumovax 04/2013, prevnar-13 2016 Tdap 2017, 2019 Shingrix - 10/2021, 12/2021 RSV - discussed  Advanced directives: HCPOA would be daughter - has spoken with her. Packet previously provided. Encouraged she complete this.  Seat belt use discussed Sunscreen use discussed. No changing moles on skin.  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 Interim medical history since our last visit reviewed. Allergies and medications reviewed and updated. Outpatient Medications Prior to Visit  Medication Sig Dispense Refill   acetaminophen  (TYLENOL ) 325 MG tablet Take 650 mg by mouth every 6 (six) hours as needed (pain).     amLODipine  (NORVASC ) 10 MG tablet Take 1 tablet (10 mg total) by mouth daily. 90 tablet 3   amoxicillin  (AMOXIL ) 500 MG tablet Take 4 tablets (2,000 mg total) by mouth as needed (1 hour prior to dental  work). 4 tablet 3   aspirin  EC 81 MG tablet Take 81 mg by mouth daily. Swallow whole.     atorvastatin  (LIPITOR) 20 MG tablet Take 1 tablet (20 mg total) by mouth daily. 90 tablet 2   Calcium  Carb-Cholecalciferol  (CALCIUM -VITAMIN D ) 600-400 MG-UNIT TABS Take 1 tablet by mouth daily.     cholecalciferol  (VITAMIN D ) 1000 UNITS tablet Take 1,000 Units by mouth daily.     furosemide  (LASIX ) 20 MG tablet TAKE 1 TABLET EVERY DAY 90 tablet 3   methotrexate  (RHEUMATREX) 2.5 MG tablet Take 15 mg by mouth every Friday. Caution:Chemotherapy. Protect from light.     sacubitril -valsartan  (ENTRESTO ) 97-103 MG Take 1 tablet by mouth 2 (two) times daily. NEEDS FOLLOW UP APPOINTMENT FOR MORE REFILLS 180 tablet 0   warfarin (COUMADIN ) 4 MG tablet TAKE 1 TABLET EVERY DAY OR AS DIRECTED BY THE COUMADIN  CLINIC 100 tablet 3   folic acid  (FOLVITE ) 1 MG tablet Take 1 tablet (1 mg total) by mouth daily. 90 tablet 3   levothyroxine  (SYNTHROID ) 100 MCG tablet TAKE 1 TABLET EVERY DAY BEFORE BREAKFAST 90 tablet 0   omeprazole  (PRILOSEC) 20 MG capsule TAKE 1 CAPSULE EVERY DAY (Patient taking differently: Take 20 mg by mouth every other day.) 90 capsule 3   spironolactone  (ALDACTONE ) 25 MG tablet TAKE 1 TABLET EVERY DAY 90 tablet 0   benzonatate  (TESSALON ) 200 MG capsule Take 1 capsule (200 mg total) by mouth 3 (three) times daily as needed. 60 capsule 0   No facility-administered medications prior to visit.     Per HPI unless specifically indicated in ROS section below Review of Systems  Constitutional:  Negative for activity change, appetite change, chills, fatigue, fever and unexpected weight change.  HENT:  Negative for hearing loss.   Eyes:  Negative for visual disturbance.  Respiratory:  Negative for cough, chest tightness, shortness of breath and wheezing.   Cardiovascular:  Negative for chest pain, palpitations and leg swelling.  Gastrointestinal:  Negative for abdominal distention, abdominal pain, blood in stool,  constipation, diarrhea, nausea and vomiting.  Genitourinary:  Negative for difficulty urinating and hematuria.  Musculoskeletal:  Negative for arthralgias, myalgias and neck pain.  Skin:  Negative for rash.  Neurological:  Negative for dizziness, seizures, syncope and headaches.  Hematological:  Negative for adenopathy. Does not bruise/bleed easily.  Psychiatric/Behavioral:  Negative for dysphoric mood. The patient is not nervous/anxious.     Objective:  BP 126/68   Pulse (!) 51   Temp 98.4 F (36.9 C) (Oral)   Ht 5\' 3"  (1.6 m)   Wt 127 lb 2 oz (57.7 kg)   SpO2 97%   BMI 22.52 kg/m   Wt Readings from Last 3 Encounters:  10/11/23 127 lb 2 oz (57.7 kg)  10/04/23 128 lb (58.1 kg)  08/03/23 117 lb (53.1 kg)      Physical Exam  Vitals and nursing note reviewed.  Constitutional:      Appearance: Normal appearance. She is not ill-appearing.  HENT:     Head: Normocephalic and atraumatic.     Right Ear: Tympanic membrane, ear canal and external ear normal. There is no impacted cerumen.     Left Ear: Tympanic membrane, ear canal and external ear normal. There is no impacted cerumen.     Mouth/Throat:     Mouth: Mucous membranes are moist.     Pharynx: Oropharynx is clear. No oropharyngeal exudate or posterior oropharyngeal erythema.  Eyes:     General:        Right eye: No discharge.        Left eye: No discharge.     Extraocular Movements: Extraocular movements intact.     Conjunctiva/sclera: Conjunctivae normal.     Pupils: Pupils are equal, round, and reactive to light.  Neck:     Thyroid : No thyroid  mass or thyromegaly.     Vascular: Carotid bruit (referred from cardiac murmur) present.  Cardiovascular:     Rate and Rhythm: Normal rate and regular rhythm.     Pulses: Normal pulses.     Heart sounds: Murmur (3/6 valve replacement systolic murmur) heard.  Pulmonary:     Effort: Pulmonary effort is normal. No respiratory distress.     Breath sounds: Normal breath sounds. No  wheezing, rhonchi or rales.  Abdominal:     General: Bowel sounds are normal. There is no distension.     Palpations: Abdomen is soft. There is no mass.     Tenderness: There is no abdominal tenderness. There is no guarding or rebound.     Hernia: No hernia is present.  Musculoskeletal:     Cervical back: Normal range of motion and neck supple. No rigidity.     Right lower leg: No edema.     Left lower leg: No edema.  Lymphadenopathy:     Cervical: No cervical adenopathy.  Skin:    General: Skin is warm and dry.     Findings: No rash.  Neurological:     General: No focal deficit present.     Mental Status: She is alert. Mental status is at baseline.  Psychiatric:        Mood and Affect: Mood normal.        Behavior: Behavior normal.       Results for orders placed or performed in visit on 10/04/23  Microalbumin / creatinine urine ratio   Collection Time: 10/04/23  8:20 AM  Result Value Ref Range   Microalb, Ur 1.6 0.0 - 1.9 mg/dL   Creatinine,U 16.1 mg/dL   Microalb Creat Ratio 38.0 (H) 0.0 - 30.0 mg/g  TSH   Collection Time: 10/04/23  8:20 AM  Result Value Ref Range   TSH 1.44 0.35 - 5.50 uIU/mL  CBC with Differential/Platelet   Collection Time: 10/04/23  8:20 AM  Result Value Ref Range   WBC 4.7 4.0 - 10.5 K/uL   RBC 4.10 3.87 - 5.11 Mil/uL   Hemoglobin 10.8 (L) 12.0 - 15.0 g/dL   HCT 09.6 (L) 04.5 - 40.9 %   MCV 82.2 78.0 - 100.0 fl   MCHC 32.0 30.0 - 36.0 g/dL   RDW 81.1 91.4 - 78.2 %   Platelets 165.0 150.0 - 400.0 K/uL   Neutrophils Relative % 66.3 43.0 - 77.0 %   Lymphocytes Relative 21.0 12.0 - 46.0 %   Monocytes Relative 9.7 3.0 - 12.0 %  Eosinophils Relative 2.0 0.0 - 5.0 %   Basophils Relative 1.0 0.0 - 3.0 %   Neutro Abs 3.1 1.4 - 7.7 K/uL   Lymphs Abs 1.0 0.7 - 4.0 K/uL   Monocytes Absolute 0.5 0.1 - 1.0 K/uL   Eosinophils Absolute 0.1 0.0 - 0.7 K/uL   Basophils Absolute 0.0 0.0 - 0.1 K/uL  VITAMIN D  25 Hydroxy (Vit-D Deficiency, Fractures)    Collection Time: 10/04/23  8:20 AM  Result Value Ref Range   VITD 47.05 30.00 - 100.00 ng/mL  Comprehensive metabolic panel with GFR   Collection Time: 10/04/23  8:20 AM  Result Value Ref Range   Sodium 139 135 - 145 mEq/L   Potassium 4.5 3.5 - 5.1 mEq/L   Chloride 104 96 - 112 mEq/L   CO2 28 19 - 32 mEq/L   Glucose, Bld 92 70 - 99 mg/dL   BUN 22 6 - 23 mg/dL   Creatinine, Ser 4.78 0.40 - 1.20 mg/dL   Total Bilirubin 0.8 0.2 - 1.2 mg/dL   Alkaline Phosphatase 94 39 - 117 U/L   AST 18 0 - 37 U/L   ALT 11 0 - 35 U/L   Total Protein 7.2 6.0 - 8.3 g/dL   Albumin  4.3 3.5 - 5.2 g/dL   GFR 29.56 (L) >21.30 mL/min   Calcium  9.6 8.4 - 10.5 mg/dL  Lipid panel   Collection Time: 10/04/23  8:20 AM  Result Value Ref Range   Cholesterol 128 0 - 200 mg/dL   Triglycerides 86.5 0.0 - 149.0 mg/dL   HDL 78.46 >96.29 mg/dL   VLDL 9.6 0.0 - 52.8 mg/dL   LDL Cholesterol 60 0 - 99 mg/dL   Total CHOL/HDL Ratio 2    NonHDL 69.29     Assessment & Plan:   Problem List Items Addressed This Visit     Health maintenance examination - Primary (Chronic)   Preventative protocols reviewed and updated unless pt declined. Discussed healthy diet and lifestyle.       Advanced directives, counseling/discussion (Chronic)   Advanced directives: HCPOA would be daughter - has spoken with her. Packet previously provided. Encouraged she complete this.       Full dentures (Chronic)   Late 2024      GOITER, MULTINODULAR   TSH remains normal.       Relevant Medications   levothyroxine  (SYNTHROID ) 100 MCG tablet   Hypothyroidism   Chronic, stable on current regimen - continue.       Relevant Medications   levothyroxine  (SYNTHROID ) 100 MCG tablet   Osteopenia   Reviewed latest DEXA - consider updating after 02/2024.  Encouraged good dietary calcium  intake, continue vit D 1000u daily, regular weight bearing exercise.       Persistent atrial fibrillation (HCC)   Continues coumadin        Relevant  Medications   spironolactone  (ALDACTONE ) 25 MG tablet   White coat syndrome with hypertension   Chronic, great control on current regimen - continue this.       Relevant Medications   spironolactone  (ALDACTONE ) 25 MG tablet   Anemia of chronic disease   Chronic, stable period off oral iron  replacement. Saw hematology 09/2021.       Relevant Medications   folic acid  (FOLVITE ) 1 MG tablet   Vitamin D  deficiency   Continue daily replacement      Rheumatoid arthritis (HCC)   Appreciate rheum care [facility])      CKD (chronic kidney disease) stage 3, GFR 30-59 ml/min (  HCC)   Actually improved kidney function on latest check.  Discussed proteinuria.       Chronic heart failure with preserved ejection fraction (HFpEF) (HCC)   Chronic, stable period on entresto , lasix , spironolactone .       Relevant Medications   spironolactone  (ALDACTONE ) 25 MG tablet   S/P mitral valve replacement with bioprosthetic valve   Stable period s/p valve replacement Needs SBE ppx      Long term (current) use of anticoagulants   Continues coumadin  through cardiology clinic.       Other Visit Diagnoses       HTN (hypertension), malignant       Relevant Medications   spironolactone  (ALDACTONE ) 25 MG tablet        Meds ordered this encounter  Medications   folic acid  (FOLVITE ) 1 MG tablet    Sig: Take 1 tablet (1 mg total) by mouth daily.    Dispense:  90 tablet    Refill:  4   levothyroxine  (SYNTHROID ) 100 MCG tablet    Sig: Take 1 tablet (100 mcg total) by mouth daily before breakfast.    Dispense:  90 tablet    Refill:  4   omeprazole  (PRILOSEC) 20 MG capsule    Sig: Take 1 capsule (20 mg total) by mouth every other day.    Dispense:  45 capsule    Refill:  3   spironolactone  (ALDACTONE ) 25 MG tablet    Sig: Take 1 tablet (25 mg total) by mouth daily.    Dispense:  90 tablet    Refill:  3    No orders of the defined types were placed in this encounter.   Patient Instructions   Good to see you today  Finish filling advanced directive and bring us  a copy when you complete it Return as needed or in 1 year for next physical   Follow up plan: Return in about 1 year (around 10/10/2024) for annual exam, prior fasting for blood work, medicare wellness visit.  Claire Crick, MD

## 2023-10-11 NOTE — Assessment & Plan Note (Signed)
 Reviewed latest DEXA - consider updating after 02/2024.  Encouraged good dietary calcium  intake, continue vit D 1000u daily, regular weight bearing exercise.

## 2023-10-12 ENCOUNTER — Ambulatory Visit: Attending: Cardiology

## 2023-10-12 DIAGNOSIS — Z7901 Long term (current) use of anticoagulants: Secondary | ICD-10-CM | POA: Diagnosis not present

## 2023-10-12 DIAGNOSIS — I4819 Other persistent atrial fibrillation: Secondary | ICD-10-CM

## 2023-10-12 DIAGNOSIS — Z953 Presence of xenogenic heart valve: Secondary | ICD-10-CM

## 2023-10-12 LAB — POCT INR: INR: 2 (ref 2.0–3.0)

## 2023-10-12 NOTE — Patient Instructions (Signed)
 Continue taking warfarin 1 tablet daily except 1/2 tablet on Sundays.  Recheck 6 weeks. Coumadin Clinic [phone] Fax Clearance Forms to [phone] or [phone]

## 2023-10-18 DIAGNOSIS — Z6821 Body mass index (BMI) 21.0-21.9, adult: Secondary | ICD-10-CM | POA: Diagnosis not present

## 2023-10-18 DIAGNOSIS — Z79899 Other long term (current) drug therapy: Secondary | ICD-10-CM | POA: Diagnosis not present

## 2023-10-18 DIAGNOSIS — Z1589 Genetic susceptibility to other disease: Secondary | ICD-10-CM | POA: Diagnosis not present

## 2023-10-18 DIAGNOSIS — M1991 Primary osteoarthritis, unspecified site: Secondary | ICD-10-CM | POA: Diagnosis not present

## 2023-10-18 DIAGNOSIS — M0579 Rheumatoid arthritis with rheumatoid factor of multiple sites without organ or systems involvement: Secondary | ICD-10-CM | POA: Diagnosis not present

## 2023-10-29 ENCOUNTER — Other Ambulatory Visit (HOSPITAL_COMMUNITY): Payer: Self-pay | Admitting: Internal Medicine

## 2023-10-29 DIAGNOSIS — I10 Essential (primary) hypertension: Secondary | ICD-10-CM

## 2023-11-23 ENCOUNTER — Ambulatory Visit: Attending: Cardiovascular Disease

## 2023-11-23 DIAGNOSIS — Z953 Presence of xenogenic heart valve: Secondary | ICD-10-CM | POA: Diagnosis not present

## 2023-11-23 DIAGNOSIS — I4819 Other persistent atrial fibrillation: Secondary | ICD-10-CM

## 2023-11-23 DIAGNOSIS — Z7901 Long term (current) use of anticoagulants: Secondary | ICD-10-CM | POA: Diagnosis not present

## 2023-11-23 LAB — POCT INR: INR: 1.8 — AB (ref 2.0–3.0)

## 2023-11-23 NOTE — Patient Instructions (Signed)
 Take 1.5 tablets today only then Continue taking warfarin 1 tablet daily except 1/2 tablet on Sundays. Recheck 6 weeks. Coumadin  Clinic [phone] Fax Clearance Forms to [phone] or [phone]

## 2023-12-21 ENCOUNTER — Ambulatory Visit: Payer: Self-pay | Admitting: Cardiovascular Disease

## 2023-12-21 ENCOUNTER — Ambulatory Visit (HOSPITAL_COMMUNITY)
Admission: RE | Admit: 2023-12-21 | Discharge: 2023-12-21 | Disposition: A | Discharge status: Home / Self Care | Payer: Medicare PPO | Source: Ambulatory Visit | Attending: Cardiovascular Disease | Admitting: Cardiovascular Disease

## 2023-12-21 ENCOUNTER — Other Ambulatory Visit: Payer: Self-pay

## 2023-12-21 DIAGNOSIS — Z953 Presence of xenogenic heart valve: Secondary | ICD-10-CM | POA: Diagnosis not present

## 2023-12-21 DIAGNOSIS — R600 Localized edema: Secondary | ICD-10-CM | POA: Diagnosis not present

## 2023-12-21 DIAGNOSIS — I10 Essential (primary) hypertension: Secondary | ICD-10-CM

## 2023-12-21 DIAGNOSIS — I340 Nonrheumatic mitral (valve) insufficiency: Secondary | ICD-10-CM

## 2023-12-21 LAB — ECHOCARDIOGRAM COMPLETE
AR max vel: 1.49 cm2
AV Area VTI: 1.66 cm2
AV Area mean vel: 1.66 cm2
AV Mean grad: 4 mmHg
AV Peak grad: 9.4 mmHg
Ao pk vel: 1.53 m/s
Area-P 1/2: 2.1 cm2
MV VTI: 1.04 cm2
S' Lateral: 3.57 cm

## 2023-12-27 DIAGNOSIS — H2513 Age-related nuclear cataract, bilateral: Secondary | ICD-10-CM | POA: Diagnosis not present

## 2023-12-27 DIAGNOSIS — H52203 Unspecified astigmatism, bilateral: Secondary | ICD-10-CM | POA: Diagnosis not present

## 2023-12-27 DIAGNOSIS — H25013 Cortical age-related cataract, bilateral: Secondary | ICD-10-CM | POA: Diagnosis not present

## 2024-01-04 ENCOUNTER — Ambulatory Visit: Attending: Cardiovascular Disease

## 2024-01-04 DIAGNOSIS — I4819 Other persistent atrial fibrillation: Secondary | ICD-10-CM

## 2024-01-04 DIAGNOSIS — Z953 Presence of xenogenic heart valve: Secondary | ICD-10-CM | POA: Diagnosis not present

## 2024-01-04 DIAGNOSIS — Z7901 Long term (current) use of anticoagulants: Secondary | ICD-10-CM | POA: Diagnosis not present

## 2024-01-04 LAB — POCT INR: INR: 3.4 — AB (ref 2.0–3.0)

## 2024-01-04 NOTE — Progress Notes (Signed)
Please see anticoagulation encounter.

## 2024-01-04 NOTE — Patient Instructions (Signed)
 Take 0.5 tablet today only then Continue taking warfarin 1 tablet daily except 1/2 tablet on Sundays. Recheck 6 weeks. Coumadin  Clinic [phone] Fax Clearance Forms to [phone] or [phone]

## 2024-01-17 DIAGNOSIS — M0579 Rheumatoid arthritis with rheumatoid factor of multiple sites without organ or systems involvement: Secondary | ICD-10-CM | POA: Diagnosis not present

## 2024-02-15 ENCOUNTER — Telehealth: Payer: Self-pay | Admitting: *Deleted

## 2024-02-15 ENCOUNTER — Ambulatory Visit

## 2024-02-15 NOTE — Telephone Encounter (Signed)
 Called patient since she missed her 930am appointment. There was no answer so left her a message to call back regarding this.

## 2024-02-17 ENCOUNTER — Ambulatory Visit: Attending: Cardiovascular Disease | Admitting: Pharmacist

## 2024-02-17 DIAGNOSIS — Z953 Presence of xenogenic heart valve: Secondary | ICD-10-CM

## 2024-02-17 DIAGNOSIS — Z7901 Long term (current) use of anticoagulants: Secondary | ICD-10-CM | POA: Diagnosis not present

## 2024-02-17 DIAGNOSIS — I4819 Other persistent atrial fibrillation: Secondary | ICD-10-CM

## 2024-02-17 LAB — POCT INR: INR: 2.3 (ref 2.0–3.0)

## 2024-02-17 NOTE — Progress Notes (Signed)
 INR 2.3; Please see anticoagulation encounter   Description   Take 0.5 tablet today only then Continue taking warfarin 1 tablet daily except 1/2 tablet on Sundays. Recheck 6 weeks. Coumadin  Clinic [phone] Fax Clearance Forms to [phone] or [phone]

## 2024-02-17 NOTE — Patient Instructions (Signed)
 Description   Take 0.5 tablet today only then Continue taking warfarin 1 tablet daily except 1/2 tablet on Sundays. Recheck 6 weeks. Coumadin  Clinic [phone] Fax Clearance Forms to [phone] or [phone]

## 2024-03-21 ENCOUNTER — Ambulatory Visit: Admitting: Podiatry

## 2024-03-21 ENCOUNTER — Encounter: Payer: Self-pay | Admitting: Podiatry

## 2024-03-21 DIAGNOSIS — D2371 Other benign neoplasm of skin of right lower limb, including hip: Secondary | ICD-10-CM | POA: Diagnosis not present

## 2024-03-21 DIAGNOSIS — B351 Tinea unguium: Secondary | ICD-10-CM

## 2024-03-21 DIAGNOSIS — M7989 Other specified soft tissue disorders: Secondary | ICD-10-CM | POA: Insufficient documentation

## 2024-03-21 DIAGNOSIS — R7689 Other specified abnormal immunological findings in serum: Secondary | ICD-10-CM | POA: Insufficient documentation

## 2024-03-21 DIAGNOSIS — M799 Soft tissue disorder, unspecified: Secondary | ICD-10-CM | POA: Insufficient documentation

## 2024-03-21 DIAGNOSIS — Z1589 Genetic susceptibility to other disease: Secondary | ICD-10-CM | POA: Insufficient documentation

## 2024-03-21 DIAGNOSIS — E663 Overweight: Secondary | ICD-10-CM | POA: Insufficient documentation

## 2024-03-21 DIAGNOSIS — M1991 Primary osteoarthritis, unspecified site: Secondary | ICD-10-CM | POA: Insufficient documentation

## 2024-03-21 DIAGNOSIS — M2550 Pain in unspecified joint: Secondary | ICD-10-CM | POA: Insufficient documentation

## 2024-03-21 DIAGNOSIS — M79676 Pain in unspecified toe(s): Secondary | ICD-10-CM | POA: Diagnosis not present

## 2024-03-21 DIAGNOSIS — D2372 Other benign neoplasm of skin of left lower limb, including hip: Secondary | ICD-10-CM | POA: Diagnosis not present

## 2024-03-21 NOTE — Progress Notes (Signed)
 Subjective:  Patient ID: Joanna Hood, female    DOB: 1946-05-25,  MRN: [id number] HPI Chief Complaint  Patient presents with   Debridement    Toenails - mainly hallux - thick and discolored, feels they may be ingrown, also callused plantar bilateral    New Patient (Initial Visit)    78 y.o. female presents with the above complaint.   ROS: Denies fever chills nausea mobic muscle aches pains calf pain back pain chest pain shortness of breath.  Past Medical History:  Diagnosis Date   Anemia    Atrial fibrillation (HCC)    Atrial fibrillation with RVR (HCC) 2014   persistent   Cataract    Chronic diastolic CHF (congestive heart failure) (HCC)    HTN   Chronic diastolic congestive heart failure (HCC)    COVID-19 virus infection 01/20/2019   ESOPHAGEAL STRICTURE 02/19/2005   Qualifier: Diagnosis of  By: Adella MD, Almarie Saliva    Heart murmur    History of anemia    unclear cause, now resolved   History of chicken pox    History of colon polyps    benign   HLD (hyperlipidemia)    Hypothyroidism    Malignant hypertension longstanding   Microcytosis    Mitral valve regurgitation    Osteopenia 12/2013   T -2.3 hip   Osteoporosis    Pulmonary hypertension (HCC)    Rheumatoid arthritis (HCC) 11/2015   +RF, +CCP, ESR 49, synovitis on exam and US  11/2015 [facility])   Right lower lobe pneumonia 07/24/2014   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   29 mm Medtronic Mosaic stented porcine bioprosthetic tissue valve   Tricuspid regurgitation    Vitamin D  deficiency    Past Surgical History:  Procedure Laterality Date   CARDIAC CATHETERIZATION  2014   normal per patient   CLIPPING OF ATRIAL APPENDAGE N/A 10/28/2020   Procedure: CLIPPING OF ATRIAL APPENDAGE USING ATRICURE  CLIP SIZE ;  Surgeon: Dusty Sudie DEL, MD;  Location: [facility] OR;  Service: Open Heart Surgery;  Laterality: N/A;   COLONOSCOPY  11/2015   TA, int hem, rpt 5 yrs  [facility])   COLONOSCOPY  05/2021   WNL no repeat recommended [facility])   LEFT HEART CATHETERIZATION WITH CORONARY ANGIOGRAM N/A 05/01/2013   Procedure: LEFT HEART CATHETERIZATION WITH CORONARY ANGIOGRAM;  Surgeon: Alm LELON Clay, MD;  Location: [facility] CATH LAB;  Service: Cardiovascular;  Laterality: N/A;   MITRAL VALVE REPAIR N/A 10/28/2020   Procedure: MITRAL VALVE REPLACEMENT (MVR) USING MEDTRONIC MOSAIC VALVE SIZE ;  Surgeon: Dusty Sudie DEL, MD;  Location: [facility] OR;  Service: Open Heart Surgery;  Laterality: N/A;   MULTIPLE EXTRACTIONS WITH ALVEOLOPLASTY N/A 10/24/2020   Procedure: MULTIPLE EXTRACTION WITH ALVEOLOPLASTY;  Surgeon: Celena Lum NOVAK, DMD;  Location: [facility] OR;  Service: Dentistry;  Laterality: N/A;   MULTIPLE TOOTH EXTRACTIONS  10/2020   Extractions of teeth numbers 3, 4, 7, 8, 9, 10, 11 and 14 - prior to valvular surgery   PARTIAL HYSTERECTOMY  1970   fibroids   RIGHT HEART CATH N/A 10/25/2020   Procedure: RIGHT HEART CATH;  Surgeon: Cherrie Toribio SAUNDERS, MD;  Location: [facility] INVASIVE CV LAB;  Service: Cardiovascular;  Laterality: N/A;   RIGHT/LEFT HEART CATH AND CORONARY ANGIOGRAPHY N/A 10/21/2020   Procedure: RIGHT/LEFT HEART CATH AND CORONARY ANGIOGRAPHY;  Surgeon: [name], MD;  Location: [facility] INVASIVE CV LAB;  Service: Cardiovascular;  Laterality: N/A;   TEE WITHOUT CARDIOVERSION  N/A 10/21/2020   Procedure: TRANSESOPHAGEAL ECHOCARDIOGRAM (TEE);  Surgeon: Pietro Redell RAMAN, MD;  Location: [facility] ENDOSCOPY;  Service: Cardiovascular;  Laterality: N/A;   TEE WITHOUT CARDIOVERSION N/A 10/28/2020   Procedure: TRANSESOPHAGEAL ECHOCARDIOGRAM (TEE);  Surgeon: Dusty Sudie DEL, MD;  Location: [facility] OR;  Service: Open Heart Surgery;  Laterality: N/A;    Current Outpatient Medications:    acetaminophen  (TYLENOL ) 325 MG tablet, Take 650 mg by mouth every 6 (six) hours as needed (pain)., Disp: , Rfl:    amLODipine  (NORVASC ) 10 MG tablet, Take 1 tablet (10 mg total) by mouth daily., Disp: 90  tablet, Rfl: 3   amoxicillin  (AMOXIL ) 500 MG tablet, Take 4 tablets (2,000 mg total) by mouth as needed (1 hour prior to dental work)., Disp: 4 tablet, Rfl: 3   aspirin  EC 81 MG tablet, Take 81 mg by mouth daily. Swallow whole., Disp: , Rfl:    atorvastatin  (LIPITOR) 20 MG tablet, Take 1 tablet (20 mg total) by mouth daily., Disp: 90 tablet, Rfl: 2   Calcium  Carb-Cholecalciferol  (CALCIUM -VITAMIN D ) 600-400 MG-UNIT TABS, Take 1 tablet by mouth daily., Disp: , Rfl:    cholecalciferol  (VITAMIN D ) 1000 UNITS tablet, Take 1,000 Units by mouth daily., Disp: , Rfl:    folic acid  (FOLVITE ) 1 MG tablet, Take 1 tablet (1 mg total) by mouth daily., Disp: 90 tablet, Rfl: 4   furosemide  (LASIX ) 20 MG tablet, TAKE 1 TABLET EVERY DAY, Disp: 90 tablet, Rfl: 3   levothyroxine  (SYNTHROID ) 100 MCG tablet, Take 1 tablet (100 mcg total) by mouth daily before breakfast., Disp: 90 tablet, Rfl: 4   methotrexate  (RHEUMATREX) 2.5 MG tablet, Take 15 mg by mouth every Friday. Caution:Chemotherapy. Protect from light., Disp: , Rfl:    omeprazole  (PRILOSEC) 20 MG capsule, Take 1 capsule (20 mg total) by mouth every other day., Disp: 45 capsule, Rfl: 3   sacubitril -valsartan  (ENTRESTO ) 97-103 MG, TAKE 1 TABLET TWICE DAILY (NEEDS FOLLOW UP APPOINTMENT FOR MORE REFILLS), Disp: 180 tablet, Rfl: 2   spironolactone  (ALDACTONE ) 25 MG tablet, Take 1 tablet (25 mg total) by mouth daily., Disp: 90 tablet, Rfl: 3   warfarin (COUMADIN ) 4 MG tablet, TAKE 1 TABLET EVERY DAY OR AS DIRECTED BY THE COUMADIN  CLINIC, Disp: 100 tablet, Rfl: 3  Allergies  Allergen Reactions   Metronidazole Itching and Rash   Review of Systems Objective:  There were no vitals filed for this visit.  General: Well developed, nourished, in no acute distress, alert and oriented x3   Dermatological: Skin is warm, dry and supple bilateral. Nails x 10 are thick yellow dystrophic; remaining integument appears unremarkable at this time. There are no open sores, no  preulcerative lesions, no rash or signs of infection present.  Benign neoplasm of skin bilateral multiple forefoot and rear foot.  No preulcerative lesions.  Vascular: Dorsalis Pedis artery and Posterior Tibial artery pedal pulses are 2/4 bilateral with immedate capillary fill time. Pedal hair growth present. No varicosities and no lower extremity edema present bilateral.   Neruologic: Grossly intact via light touch bilateral. Vibratory intact via tuning fork bilateral. Protective threshold with Semmes Wienstein monofilament intact to all pedal sites bilateral. Patellar and Achilles deep tendon reflexes 2+ bilateral. No Babinski or clonus noted bilateral.   Musculoskeletal: No gross boney pedal deformities bilateral. No pain, crepitus, or limitation noted with foot and ankle range of motion bilateral. Muscular strength 5/5 in all groups tested bilateral.  Gait: Unassisted, Nonantalgic.    Radiographs:  None taken  Assessment & Plan:  Assessment: Nail dystrophy possible onychomycosis bilateral.  Benign neoplasm skin forefoot bilateral.  Anticoagulant therapy  Plan: Debridement of benign skin lesions and debridement of toenails 1 through 5 bilateral covered service secondary to pain.  Also secondary to anticoagulant.     Rafeal Skibicki T. [place], [place]

## 2024-03-29 DIAGNOSIS — E039 Hypothyroidism, unspecified: Secondary | ICD-10-CM | POA: Diagnosis not present

## 2024-03-29 DIAGNOSIS — I4891 Unspecified atrial fibrillation: Secondary | ICD-10-CM | POA: Diagnosis not present

## 2024-03-29 DIAGNOSIS — D6869 Other thrombophilia: Secondary | ICD-10-CM | POA: Diagnosis not present

## 2024-03-29 DIAGNOSIS — I110 Hypertensive heart disease with heart failure: Secondary | ICD-10-CM | POA: Diagnosis not present

## 2024-03-29 DIAGNOSIS — M1990 Unspecified osteoarthritis, unspecified site: Secondary | ICD-10-CM | POA: Diagnosis not present

## 2024-03-29 DIAGNOSIS — I509 Heart failure, unspecified: Secondary | ICD-10-CM | POA: Diagnosis not present

## 2024-03-29 DIAGNOSIS — Z7982 Long term (current) use of aspirin: Secondary | ICD-10-CM | POA: Diagnosis not present

## 2024-03-29 DIAGNOSIS — K219 Gastro-esophageal reflux disease without esophagitis: Secondary | ICD-10-CM | POA: Diagnosis not present

## 2024-03-29 DIAGNOSIS — M069 Rheumatoid arthritis, unspecified: Secondary | ICD-10-CM | POA: Diagnosis not present

## 2024-03-30 ENCOUNTER — Ambulatory Visit: Attending: Cardiovascular Disease

## 2024-03-30 DIAGNOSIS — Z7901 Long term (current) use of anticoagulants: Secondary | ICD-10-CM | POA: Diagnosis not present

## 2024-03-30 DIAGNOSIS — I4819 Other persistent atrial fibrillation: Secondary | ICD-10-CM

## 2024-03-30 DIAGNOSIS — Z953 Presence of xenogenic heart valve: Secondary | ICD-10-CM | POA: Diagnosis not present

## 2024-03-30 LAB — POCT INR: INR: 2 (ref 2.0–3.0)

## 2024-03-30 NOTE — Progress Notes (Signed)
 INR 2.0 Please see anticoagulation encounter Continue taking warfarin 1 tablet daily except 1/2 tablet on Sundays. Recheck 6 weeks. Coumadin  Clinic [phone] Fax Clearance Forms to [phone] or [phone]

## 2024-03-30 NOTE — Patient Instructions (Signed)
 Continue taking warfarin 1 tablet daily except 1/2 tablet on Sundays.  Recheck 6 weeks. Coumadin Clinic [phone] Fax Clearance Forms to [phone] or [phone]

## 2024-03-31 ENCOUNTER — Telehealth: Payer: Self-pay

## 2024-03-31 NOTE — Telephone Encounter (Signed)
 Copied from CRM [id number]. Topic: General - Other >> Mar 30, 2024  8:43 AM Vena HERO wrote: Reason for CRM: Msg for [facility].... Pt had [facility] health visit from [facility] and they noticed BP was too low 108/72 and 112/75 and wanted to question the amount of medication prescribed. Please return pt call at [phone]  Phone call to pt, she reports that today BP was 122/60 before medications, not having any dizziness or fatigue. She reported that the nurse was concerned that she was taking 2 fluid pills. All but Spironolactone  is prescribed by Dr. Court.  Encouraged pt to call cardiology office for input on these medications.

## 2024-03-31 NOTE — Telephone Encounter (Signed)
 Recent BPs in office have been well controlled. I'd like her to record blood pressure readings over the next 1-2 weeks then bring in a log to review to see if any med change is necessary. Let us  know sooner if new symptoms develop ie low bp like dizzy, lightheaded, passing out.  BP Readings from Last 3 Encounters:  10/11/23 126/68  10/04/23 120/80  08/03/23 120/80

## 2024-04-02 ENCOUNTER — Other Ambulatory Visit: Payer: Self-pay | Admitting: Cardiovascular Disease

## 2024-04-02 DIAGNOSIS — I4819 Other persistent atrial fibrillation: Secondary | ICD-10-CM

## 2024-04-03 NOTE — Telephone Encounter (Signed)
 Warfarin 4mg  refill S/P mitral valve replacement with bioprosthetic valve  Last INR 03/30/24 Last OV 07/06/23

## 2024-04-04 ENCOUNTER — Telehealth: Payer: Self-pay | Admitting: Cardiovascular Disease

## 2024-04-04 NOTE — Telephone Encounter (Signed)
 Called patient reviewed all information and repeated back to me. Will call if any questions.  She will reach out if any symptoms.

## 2024-04-04 NOTE — Telephone Encounter (Signed)
 Spoke to patient Dr.Berry's advice given.Advised continue to monitor B/P keep a diary and bring readings to next appointment.

## 2024-04-04 NOTE — Telephone Encounter (Signed)
 Pt c/o medication issue:  1. Name of Medication:   amLODipine  (NORVASC ) 10 MG tablet  spironolactone  (ALDACTONE ) 25 MG tablet  furosemide  (LASIX ) 20 MG tablet   atorvastatin  (LIPITOR) 20 MG tablet  sacubitril -valsartan  (ENTRESTO ) 97-103 MG   2. How are you currently taking this medication (dosage and times per day)?   As prescribed  3. Are you having a reaction (difficulty breathing--STAT)?   4. What is your medication issue?   Patient is recently had a Home Nurse visit and her BP was 108/72 and 112/75.  Patient wants a call back to discuss her medication.

## 2024-04-04 NOTE — Telephone Encounter (Signed)
 Spoke to patient she stated Home health nurse has been concerned about low B/P 108/72,112/72.She does not have pulse readings.Stated she feels good.No dizziness.She gets tired occasionally.No swelling.Medications listed in chart are correct.Advised I will send message to [facility] for advice.

## 2024-04-18 DIAGNOSIS — M0579 Rheumatoid arthritis with rheumatoid factor of multiple sites without organ or systems involvement: Secondary | ICD-10-CM | POA: Diagnosis not present

## 2024-04-18 DIAGNOSIS — Z1589 Genetic susceptibility to other disease: Secondary | ICD-10-CM | POA: Diagnosis not present

## 2024-04-18 DIAGNOSIS — Z6822 Body mass index (BMI) 22.0-22.9, adult: Secondary | ICD-10-CM | POA: Diagnosis not present

## 2024-04-18 DIAGNOSIS — M1991 Primary osteoarthritis, unspecified site: Secondary | ICD-10-CM | POA: Diagnosis not present

## 2024-04-18 DIAGNOSIS — Z79899 Other long term (current) drug therapy: Secondary | ICD-10-CM | POA: Diagnosis not present

## 2024-04-18 DIAGNOSIS — M25569 Pain in unspecified knee: Secondary | ICD-10-CM | POA: Diagnosis not present

## 2024-05-09 ENCOUNTER — Ambulatory Visit: Attending: Cardiovascular Disease

## 2024-05-09 DIAGNOSIS — Z953 Presence of xenogenic heart valve: Secondary | ICD-10-CM | POA: Diagnosis not present

## 2024-05-09 DIAGNOSIS — Z7901 Long term (current) use of anticoagulants: Secondary | ICD-10-CM

## 2024-05-09 DIAGNOSIS — I4819 Other persistent atrial fibrillation: Secondary | ICD-10-CM

## 2024-05-09 LAB — POCT INR: INR: 3.1 — AB (ref 2.0–3.0)

## 2024-05-09 NOTE — Patient Instructions (Signed)
 Description   INR 3.1, Take 1/2 tablet today, then resume same dosage of Warfarin 1 tablet daily except 1/2 tablet on Sundays. Recheck 6 weeks. Coumadin  Clinic [phone] Fax Clearance Forms to [phone] or [phone]

## 2024-05-09 NOTE — Progress Notes (Signed)
 Description   INR 3.1, Take 1/2 tablet today, then resume same dosage of Warfarin 1 tablet daily except 1/2 tablet on Sundays. Recheck 6 weeks. Coumadin  Clinic [phone] Fax Clearance Forms to [phone] or [phone]

## 2024-05-28 ENCOUNTER — Other Ambulatory Visit: Payer: Self-pay | Admitting: Cardiovascular Disease

## 2024-06-07 ENCOUNTER — Other Ambulatory Visit (HOSPITAL_COMMUNITY): Payer: Self-pay | Admitting: Cardiovascular Disease

## 2024-06-07 DIAGNOSIS — I10 Essential (primary) hypertension: Secondary | ICD-10-CM

## 2024-06-13 ENCOUNTER — Other Ambulatory Visit: Payer: Self-pay | Admitting: Cardiovascular Disease

## 2024-06-13 DIAGNOSIS — E785 Hyperlipidemia, unspecified: Secondary | ICD-10-CM

## 2024-06-20 ENCOUNTER — Ambulatory Visit: Attending: Cardiovascular Disease | Admitting: *Deleted

## 2024-06-20 DIAGNOSIS — I4819 Other persistent atrial fibrillation: Secondary | ICD-10-CM | POA: Diagnosis not present

## 2024-06-20 DIAGNOSIS — Z7901 Long term (current) use of anticoagulants: Secondary | ICD-10-CM | POA: Diagnosis not present

## 2024-06-20 DIAGNOSIS — Z953 Presence of xenogenic heart valve: Secondary | ICD-10-CM | POA: Diagnosis not present

## 2024-06-20 LAB — POCT INR: INR: 2.9 (ref 2.0–3.0)

## 2024-06-20 NOTE — Patient Instructions (Signed)
 Description   INR-2.9; Continue taking warfarin 1 tablet daily except 1/2 tablet on Sundays. Recheck 6 weeks. Coumadin  Clinic [phone] Fax Clearance Forms to [phone]

## 2024-06-20 NOTE — Progress Notes (Signed)
 Description   INR-2.9; Continue taking warfarin 1 tablet daily except 1/2 tablet on Sundays. Recheck 6 weeks. Coumadin  Clinic [phone] Fax Clearance Forms to [phone]

## 2024-07-10 ENCOUNTER — Encounter: Payer: Self-pay | Admitting: Cardiovascular Disease

## 2024-07-10 ENCOUNTER — Ambulatory Visit: Admitting: Cardiovascular Disease

## 2024-07-10 VITALS — BP 120/64 | HR 52 | Ht 64.0 in | Wt 129.0 lb

## 2024-07-10 DIAGNOSIS — I340 Nonrheumatic mitral (valve) insufficiency: Secondary | ICD-10-CM | POA: Diagnosis not present

## 2024-07-10 DIAGNOSIS — I4819 Other persistent atrial fibrillation: Secondary | ICD-10-CM

## 2024-07-10 DIAGNOSIS — I10 Essential (primary) hypertension: Secondary | ICD-10-CM | POA: Diagnosis not present

## 2024-07-10 DIAGNOSIS — E785 Hyperlipidemia, unspecified: Secondary | ICD-10-CM

## 2024-07-10 NOTE — Assessment & Plan Note (Signed)
 History of persistent A-fib rate controlled on Coumadin  oral anticoagulation followed in our Coumadin  clinic.

## 2024-07-10 NOTE — Assessment & Plan Note (Signed)
 History of severe mitral regurgitation status post right left heart cath by myself 11/21/2020 revealing normal coronary arteries.  She underwent mitral valve replacement via median sternotomy by Dr. Dusty  10/28/2020 with a Medtronic Mosaic stented porcine bioprosthetic tissue valve (29 mm) along with clipping of her left atrial appendage.  Her postop course was protracted.  She is completely asymptomatic.  She is on Coumadin  anticoagulation.  Her last echo performed 12/21/2023 showed normal LV systolic function, mild to mildly increased right ventricular systolic pressure of 46 mmHg and a well-functioning mitral bioprosthesis without any evidence of MR.

## 2024-07-10 NOTE — Patient Instructions (Signed)
 Medication Instructions:  Your physician recommends that you continue on your current medications as directed. Please refer to the Current Medication list given to you today.  *If you need a refill on your cardiac medications before your next appointment, please call your pharmacy*   Testing/Procedures: Your physician has requested that you have an echocardiogram. Echocardiography is a painless test that uses sound waves to create images of your heart. It provides your doctor with information about the size and shape of your heart and how well your hearts chambers and valves are working. This procedure takes approximately one hour. There are no restrictions for this procedure. Please do NOT wear cologne, perfume, aftershave, or lotions (deodorant is allowed). Please arrive 15 minutes prior to your appointment time.  Please note: We ask at that you not bring children with you during ultrasound (echo/ vascular) testing. Due to room size and safety concerns, children are not allowed in the ultrasound rooms during exams. Our front office staff cannot provide observation of children in our lobby area while testing is being conducted. An adult accompanying a patient to their appointment will only be allowed in the ultrasound room at the discretion of the ultrasound technician under special circumstances. We apologize for any inconvenience. **To do in July**    Follow-Up: At [facility], you and your health needs are our priority.  As part of our continuing mission to provide you with exceptional heart care, our providers are all part of one team.  This team includes your primary Cardiologist (physician) and Advanced Practice Providers or APPs (Physician Assistants and Nurse Practitioners) who all work together to provide you with the care you need, when you need it.  Your next appointment:   12 month(s)  Provider:   Dorn Lesches, MD    We recommend signing up for the patient portal  called MyChart.  Sign up information is provided on this After Visit Summary.  MyChart is used to connect with patients for Virtual Visits (Telemedicine).  Patients are able to view lab/test results, encounter notes, upcoming appointments, etc.  Non-urgent messages can be sent to your provider as well.   To learn more about what you can do with MyChart, go to [email or web address].   Other Instructions

## 2024-07-10 NOTE — Progress Notes (Signed)
 "     07/10/2024 [name]   [birth year]  [id number]  Primary Physician Rilla Baller, MD Primary Cardiologist: [name] MD [name], [place]  HPI:  Joanna Hood is a 79 y.o.     moderately overweight African-American female who I initially met several years ago. I last saw her in the office 07/06/2023.   We first met when she was admitted to [facility] with diastolic heart failure, hypertension and atrial fibrillation. She ultimately underwent cardiac catheterization revealing essentially normal coronary arteries and normal LV function by Dr. Anner. Her A. Fib converted to sinus rhythm with the aid of IV Cardizem . Her blood pressure was better controlled. She saw Laymon Shed PA-C in the office  and again was in atrial fibrillation at which time her beta blocker was adjusted and Eliquis  was added because of increased CHA2DSVASC2 score of 2. Since I saw her in the office she remained clinically stable. She denies chest pain or shortness of breath.   She remains in A. fib on Eliquis  oral anticoagulation.  She did contract COVID-19 in late July 2020 and completed her quarantine a week ago.  She had mild flulike symptoms.  She did have a 2D echo performed 05/08/2015 that showed normal LV function with moderate mitral regurgitation and severe left atrial enlargement.    She was complaining of dyspnea on exertion.  Her 2D echo confirmed severe MR.  I ultimately performed right and left heart cath on her 65/2/22 revealing normal coronary arteries and severe MR.  1 week later she underwent mitral valve replacement by Dr. Dusty via median sternotomy on 10/28/2020.  She had a Medtronic Mosaic stented porcine bioprosthetic tissue valve placed (29 mm) along with a clipping of her left atrial appendage.  Her postop course was somewhat protracted.  She was discharged home 1 week later.  She was difficult to wean off pressors postoperatively.  She has seen Dr. Cherrie,  her heart failure cardiologist, and was doing well at that time.  She is participating cardiac rehab.  She no longer has peripheral edema like she did preoperatively.  She is on Coumadin  anticoagulation.   Since I saw her a year ago she continues to do well.  She is very active.  She denies chest pain or shortness of breath.  She has no symptoms of heart failure.  Her most recent 2D echo performed 12/21/2023 revealed normal LV systolic function, mildly reduced RV systolic function with a well-functioning mitral bioprosthesis.  There is no MR.  There was severely dilated left atrium.  Pulmonary pressures were mildly elevated at 46 mmHg.     Active Medications[1]   Allergies[2]  Social History   Socioeconomic History   Marital status: Single    Spouse name: Not on file   Number of children: 1   Years of education: Not on file   Highest education level: Not on file  Occupational History   Occupation: Retired  Tobacco Use   Smoking status: Former    Current packs/day: 0.00    Types: Cigarettes    Quit date: 05/16/1969    Years since quitting: 55.1   Smokeless tobacco: Former  Building Services Engineer status: Never Used  Substance and Sexual Activity   Alcohol use: No    Alcohol/week: 0.0 standard drinks of alcohol   Drug use: No   Sexual activity: Not Currently  Other Topics Concern   Not on file  Social History Narrative   Lives  with grand-daughter and godson   Occupation: retired, 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   Edu: HS   Social Drivers of Health   Tobacco Use: Medium Risk (03/21/2024)   Patient History    Smoking Tobacco Use: Former    Smokeless Tobacco Use: Former    Passive Exposure: Not on Actuary Strain: Low Risk (10/04/2023)   Overall Financial Resource Strain (CARDIA)    Difficulty of Paying Living Expenses: Not hard at all  Food Insecurity: No Food Insecurity (10/04/2023)   Hunger Vital Sign    Worried About Running  Out of Food in the Last Year: Never true    Ran Out of Food in the Last Year: Never true  Transportation Needs: No Transportation Needs (10/04/2023)   PRAPARE - Administrator, Civil Service (Medical): No    Lack of Transportation (Non-Medical): No  Physical Activity: Sufficiently Active (10/04/2023)   Exercise Vital Sign    Days of Exercise per Week: 3 days    Minutes of Exercise per Session: 50 min  Stress: No Stress Concern Present (10/04/2023)   [employer] of Occupational Health - Occupational Stress Questionnaire    Feeling of Stress : Not at all  Social Connections: Moderately Isolated (10/04/2023)   Social Connection and Isolation Panel    Frequency of Communication with Friends and Family: More than three times a week    Frequency of Social Gatherings with Friends and Family: More than three times a week    Attends Religious Services: More than 4 times per year    Active Member of [employer] or Organizations: No    Attends Banker Meetings: Never    Marital Status: Never married  Intimate Partner Violence: Not At Risk (10/04/2023)   Humiliation, Afraid, Rape, and Kick questionnaire    Fear of Current or Ex-Partner: No    Emotionally Abused: No    Physically Abused: No    Sexually Abused: No  Depression (PHQ2-9): Low Risk (10/11/2023)   Depression (PHQ2-9)    PHQ-2 Score: 0  Alcohol Screen: Low Risk (10/04/2023)   Alcohol Screen    Last Alcohol Screening Score (AUDIT): 0  Housing: Low Risk (10/04/2023)   Housing Stability Vital Sign    Unable to Pay for Housing in the Last Year: No    Number of Times Moved in the Last Year: 0    Homeless in the Last Year: No  Utilities: Not At Risk (10/04/2023)   AHC Utilities    Threatened with loss of utilities: No  Health Literacy: Adequate Health Literacy (10/04/2023)   B1300 Health Literacy    Frequency of need for help with medical instructions: Never     Review of Systems: General: negative for chills,  fever, night sweats or weight changes.  Cardiovascular: negative for chest pain, dyspnea on exertion, edema, orthopnea, palpitations, paroxysmal nocturnal dyspnea or shortness of breath Dermatological: negative for rash Respiratory: negative for cough or wheezing Urologic: negative for hematuria Abdominal: negative for nausea, vomiting, diarrhea, bright red blood per rectum, melena, or hematemesis Neurologic: negative for visual changes, syncope, or dizziness All other systems reviewed and are otherwise negative except as noted above.    Blood pressure 120/64, pulse (!) 52, height 5' 4 (1.626 m), weight 129 lb (58.5 kg), SpO2 100%.  General appearance: alert and no distress Neck: no adenopathy, no carotid bruit, no JVD, supple, symmetrical, trachea midline, and thyroid  not enlarged, symmetric, no tenderness/mass/nodules Lungs: clear to auscultation  bilaterally Heart: irregularly irregular rhythm and soft outflow tract murmur Extremities: extremities normal, atraumatic, no cyanosis or edema Pulses: 2+ and symmetric Skin: Skin color, texture, turgor normal. No rashes or lesions Neurologic: Grossly normal  EKG EKG Interpretation Date/Time:  Monday July 10 2024 09:42:45 EST Ventricular Rate:  52 PR Interval:    QRS Duration:  80 QT Interval:  250 QTC Calculation: 232 R Axis:   77  Text Interpretation: Atrial fibrillation with slow ventricular response Low voltage QRS Nonspecific ST and T wave abnormality When compared with ECG of 06-Jul-2023 09:09, Questionable change in QRS axis Non-specific change in ST segment in Inferior leads QT has shortened Confirmed by Court Carrier [phone]) on 07/10/2024 9:49:22 AM    ASSESSMENT AND PLAN:   Mitral valve regurgitation History of severe mitral regurgitation status post right left heart cath by myself 11/21/2020 revealing normal coronary arteries.  She underwent mitral valve replacement via median sternotomy by Dr. Dusty  10/28/2020 with a  Medtronic Mosaic stented porcine bioprosthetic tissue valve (29 mm) along with clipping of her left atrial appendage.  Her postop course was protracted.  She is completely asymptomatic.  She is on Coumadin  anticoagulation.  Her last echo performed 12/21/2023 showed normal LV systolic function, mild to mildly increased right ventricular systolic pressure of 46 mmHg and a well-functioning mitral bioprosthesis without any evidence of MR.  Persistent atrial fibrillation (HCC) History of persistent A-fib rate controlled on Coumadin  oral anticoagulation followed in our Coumadin  clinic.     Carrier DOROTHA Court MD FACP,FACC,FA[facility], [facility] 07/10/2024 9:56 AM    [1]  No outpatient medications have been marked as taking for the 07/10/24 encounter (Office Visit) with Court Carrier PARAS, MD.  [2]  Allergies Allergen Reactions   Metronidazole Itching and Rash   "

## 2024-08-01 ENCOUNTER — Ambulatory Visit

## 2024-10-04 ENCOUNTER — Ambulatory Visit

## 2024-10-09 ENCOUNTER — Other Ambulatory Visit

## 2024-10-16 ENCOUNTER — Encounter: Admitting: Family Medicine
